# Patient Record
Sex: Female | Born: 1935 | Race: White | Hispanic: No | Smoking: Never smoker
Health system: Southern US, Community
[De-identification: ages and names within clinical notes are randomized; demographics above are authoritative.]

## PROBLEM LIST (undated history)

## (undated) DIAGNOSIS — M069 Rheumatoid arthritis, unspecified: Secondary | ICD-10-CM

## (undated) DIAGNOSIS — N838 Other noninflammatory disorders of ovary, fallopian tube and broad ligament: Secondary | ICD-10-CM

## (undated) DIAGNOSIS — I7090 Unspecified atherosclerosis: Secondary | ICD-10-CM

## (undated) DIAGNOSIS — N189 Chronic kidney disease, unspecified: Secondary | ICD-10-CM

## (undated) DIAGNOSIS — I7300 Raynaud's syndrome without gangrene: Secondary | ICD-10-CM

## (undated) DIAGNOSIS — M359 Systemic involvement of connective tissue, unspecified: Secondary | ICD-10-CM

## (undated) DIAGNOSIS — M810 Age-related osteoporosis without current pathological fracture: Secondary | ICD-10-CM

## (undated) DIAGNOSIS — I700 Atherosclerosis of aorta: Secondary | ICD-10-CM

## (undated) DIAGNOSIS — N182 Chronic kidney disease, stage 2 (mild): Secondary | ICD-10-CM

## (undated) DIAGNOSIS — M48061 Spinal stenosis, lumbar region without neurogenic claudication: Secondary | ICD-10-CM

## (undated) DIAGNOSIS — M4800 Spinal stenosis, site unspecified: Secondary | ICD-10-CM

## (undated) DIAGNOSIS — T7840XA Allergy, unspecified, initial encounter: Secondary | ICD-10-CM

## (undated) DIAGNOSIS — K219 Gastro-esophageal reflux disease without esophagitis: Secondary | ICD-10-CM

## (undated) DIAGNOSIS — M1990 Unspecified osteoarthritis, unspecified site: Secondary | ICD-10-CM

## (undated) DIAGNOSIS — R011 Cardiac murmur, unspecified: Secondary | ICD-10-CM

## (undated) DIAGNOSIS — E785 Hyperlipidemia, unspecified: Secondary | ICD-10-CM

## (undated) DIAGNOSIS — K5790 Diverticulosis of intestine, part unspecified, without perforation or abscess without bleeding: Secondary | ICD-10-CM

## (undated) HISTORY — DX: Spinal stenosis, lumbar region without neurogenic claudication: M48.061

## (undated) HISTORY — DX: Chronic kidney disease, stage 2 (mild): N18.2

## (undated) HISTORY — DX: Allergy, unspecified, initial encounter: T78.40XA

## (undated) HISTORY — DX: Diverticulosis of intestine, part unspecified, without perforation or abscess without bleeding: K57.90

## (undated) HISTORY — DX: Hyperlipidemia, unspecified: E78.5

## (undated) HISTORY — DX: Unspecified osteoarthritis, unspecified site: M19.90

## (undated) HISTORY — DX: Other noninflammatory disorders of ovary, fallopian tube and broad ligament: N83.8

## (undated) HISTORY — DX: Rheumatoid arthritis, unspecified: M06.9

## (undated) HISTORY — DX: Spinal stenosis, site unspecified: M48.00

## (undated) HISTORY — DX: Atherosclerosis of aorta: I70.0

## (undated) HISTORY — DX: Chronic kidney disease, unspecified: N18.9

## (undated) HISTORY — PX: EYE SURGERY: SHX253

## (undated) HISTORY — DX: Age-related osteoporosis without current pathological fracture: M81.0

## (undated) HISTORY — DX: Unspecified atherosclerosis: I70.90

---

## 1963-11-09 HISTORY — PX: BREAST SURGERY: SHX581

## 1963-11-09 HISTORY — PX: EXCISION OF BREAST BIOPSY: SHX5822

## 2003-09-16 ENCOUNTER — Observation Stay (HOSPITAL_COMMUNITY): Admission: RE | Admit: 2003-09-16 | Discharge: 2003-09-17 | Payer: Self-pay | Admitting: Surgery

## 2003-09-16 ENCOUNTER — Encounter (INDEPENDENT_AMBULATORY_CARE_PROVIDER_SITE_OTHER): Payer: Self-pay

## 2003-11-09 HISTORY — PX: CHOLECYSTECTOMY: SHX55

## 2004-08-28 ENCOUNTER — Ambulatory Visit: Payer: Self-pay | Admitting: Unknown Physician Specialty

## 2005-09-03 ENCOUNTER — Ambulatory Visit: Payer: Self-pay | Admitting: Unknown Physician Specialty

## 2005-10-15 ENCOUNTER — Encounter (INDEPENDENT_AMBULATORY_CARE_PROVIDER_SITE_OTHER): Payer: Self-pay | Admitting: Specialist

## 2005-10-15 ENCOUNTER — Ambulatory Visit (HOSPITAL_COMMUNITY): Admission: RE | Admit: 2005-10-15 | Discharge: 2005-10-15 | Payer: Self-pay | Admitting: Gastroenterology

## 2006-07-29 ENCOUNTER — Ambulatory Visit: Payer: Self-pay | Admitting: Unknown Physician Specialty

## 2006-08-10 ENCOUNTER — Ambulatory Visit: Payer: Self-pay | Admitting: Unknown Physician Specialty

## 2006-09-07 ENCOUNTER — Ambulatory Visit: Payer: Self-pay | Admitting: Unknown Physician Specialty

## 2006-11-08 DIAGNOSIS — K5790 Diverticulosis of intestine, part unspecified, without perforation or abscess without bleeding: Secondary | ICD-10-CM

## 2006-11-08 HISTORY — DX: Diverticulosis of intestine, part unspecified, without perforation or abscess without bleeding: K57.90

## 2006-11-08 HISTORY — PX: COLONOSCOPY: SHX174

## 2007-03-14 ENCOUNTER — Emergency Department: Payer: Self-pay | Admitting: Internal Medicine

## 2007-03-14 ENCOUNTER — Other Ambulatory Visit: Payer: Self-pay

## 2007-05-23 ENCOUNTER — Encounter: Admission: RE | Admit: 2007-05-23 | Discharge: 2007-05-23 | Payer: Self-pay | Admitting: Neurology

## 2007-08-21 ENCOUNTER — Ambulatory Visit: Payer: Self-pay | Admitting: Ophthalmology

## 2007-09-04 ENCOUNTER — Ambulatory Visit: Payer: Self-pay | Admitting: Ophthalmology

## 2007-09-13 ENCOUNTER — Ambulatory Visit: Payer: Self-pay | Admitting: Unknown Physician Specialty

## 2007-12-20 ENCOUNTER — Emergency Department: Payer: Self-pay | Admitting: Emergency Medicine

## 2008-01-24 ENCOUNTER — Ambulatory Visit: Payer: Self-pay | Admitting: Unknown Physician Specialty

## 2008-02-02 ENCOUNTER — Ambulatory Visit: Payer: Self-pay | Admitting: Unknown Physician Specialty

## 2008-08-02 ENCOUNTER — Ambulatory Visit: Payer: Self-pay | Admitting: Unknown Physician Specialty

## 2008-09-18 ENCOUNTER — Ambulatory Visit: Payer: Self-pay | Admitting: Unknown Physician Specialty

## 2009-05-08 LAB — HM COLONOSCOPY

## 2009-05-27 ENCOUNTER — Ambulatory Visit: Payer: Self-pay | Admitting: Gastroenterology

## 2009-09-12 ENCOUNTER — Ambulatory Visit: Payer: Self-pay | Admitting: Gastroenterology

## 2009-10-21 ENCOUNTER — Ambulatory Visit: Payer: Self-pay | Admitting: Unknown Physician Specialty

## 2010-01-28 ENCOUNTER — Ambulatory Visit: Payer: Self-pay | Admitting: Internal Medicine

## 2010-07-02 ENCOUNTER — Ambulatory Visit: Payer: Self-pay | Admitting: Orthopedic Surgery

## 2010-11-25 ENCOUNTER — Ambulatory Visit: Payer: Self-pay | Admitting: Unknown Physician Specialty

## 2011-01-01 ENCOUNTER — Ambulatory Visit: Payer: Self-pay | Admitting: Internal Medicine

## 2011-01-06 ENCOUNTER — Ambulatory Visit: Payer: Self-pay | Admitting: Internal Medicine

## 2011-01-25 ENCOUNTER — Ambulatory Visit: Payer: Self-pay | Admitting: Internal Medicine

## 2011-03-26 NOTE — Op Note (Signed)
NAMEJarah, [name]                ACCOUNT NO.:  [id number]   MEDICAL RECORD NO.:  [id number]          PATIENT TYPE:  AMB   LOCATION:  ENDO                         FACILITY:  [facility]   PHYSICIAN:  Anselmo Rod, M.D.  DATE OF BIRTH:  28-May-1927   DATE OF PROCEDURE:  10/15/2005  DATE OF DISCHARGE:                                 OPERATIVE REPORT   PROCEDURE PERFORMED:  Colonoscopy with cold biopsy x1.   ENDOSCOPIST:  Anselmo Rod, M.D.   INSTRUMENT USED:  Olympus video colonoscope.   INDICATIONS FOR PROCEDURE:  A 77 year old white female undergoing a  screening colonoscopy to rule out colonic polyps, masses, etc.   PREPROCEDURE PREPARATION:  Informed consent was procured from the patient.  The patient fasted for eight hours prior to the procedure and prepped with a  bottle of magnesium citrate and a gallon of GoLYTELY the night prior to the  procedure.  Risks and benefits of the procedure including a 10% miss rate of  cancer and polyps was discussed with the patient as well.   PREPROCEDURE PHYSICAL:  VITAL SIGNS:  Stable vital signs.  NECK:  Supple.  CHEST:  Clear to auscultation.  CARDIOVASCULAR:  S1 and S2 regular.  ABDOMEN:  Soft with normal bowel sounds.   DESCRIPTION OF PROCEDURE:  The patient was placed in left lateral decubitus  position, sedated with 60 mcg of Fentanyl and 6 mg of Versed in slow  incremental doses.  Once the patient was adequately sedated and maintained  on low flow oxygen and continuous cardiac monitoring, the Olympus video  colonoscope was advanced from the rectum to the cecum.  The appendiceal  orifice and ileocecal valve were visualized and photographed.  The terminal  ileum appeared healthy and without lesions.  A small sessile polyp was  biopsied from the cecal base.  There was evidence of early sigmoid  diverticula.  Retroflexion revealed no abnormalities.   IMPRESSION:  1.Early sigmoid diverticulosis.  2.Small sessile polyp biopsied from  cecal base.  3.Otherwise unrevealing exam.   RECOMMENDATIONS:  1.Await pathology results.  2.Repeat colonoscopy depending on pathology results.  3.Avoid nonsteroidals including aspirin for the next two weeks.  4.Outpatient as needed arises in the future.      Anselmo Rod, M.D.  Electronically Signed     JNM/MEDQ  D:  10/15/2005  T:  10/15/2005  Job:  [id number]   cc:   Magda Kiel, M.D.

## 2011-03-26 NOTE — Op Note (Signed)
[name], [name]                            ACCOUNT NO.:  [id number]   MEDICAL RECORD NO.:  [id number]                   PATIENT TYPE:  AMB   LOCATION:  DAY                                  [facility]ACILITY[facility]  [facility]   PHYSICIAN:  Abigail Miyamoto, M.D.              DATE OF BIRTH:  03-25-1927   DATE OF PROCEDURE:  09/16/2003  DATE OF DISCHARGE:                                 OPERATIVE REPORT   PREOPERATIVE DIAGNOSIS:  Biliary dyskinesia   POSTOPERATIVE DIAGNOSIS:  Biliary dyskinesia.   PROCEDURE:  Laparoscopic cholecystectomy with intraoperative cholangiogram.   SURGEON:  Abigail Miyamoto, M.D.   ASSISTANT:  Adolph Pollack, M.D.   ANESTHESIA:  General endotracheal anesthesia.   ESTIMATED BLOOD LOSS:  Minimal.   FINDINGS:  The patient was found to have a normal cholangiogram.   DESCRIPTION OF PROCEDURE:  The patient was brought to the operating room and  identified as [name]. She was placed in the supine position on the  operating table and general anesthesia was induced. Her  abdomen  was then  prepped and draped in the usual sterile fashion.   Using the #15 blade a small  transverse incision was made below the  umbilicus. This incision was carried down to the fascia which was opened  with the scalpel. A hemostat was used to pass through the peritoneal cavity.   Next a 0 Vicryl pursestring suture was placed around the fascial opening.  The Hasson port was placed through the opening and  insufflation of the  abdomen was begun. A  12-mm port was then placed in the patient's epigastrium and two 5-mm ports  were placed in the patient's right flank under direct vision. The  gallbladder was then grasped and retracted above the liver bed.   Dissection was then carried out at the base of the gallbladder. The cystic  artery was found to be anterior. It was clipped proximally  and once  distally and transected with scissors. The cystic duct was then identified  and clipped  once distally. An angio catheter was then inserted in the right  upper quadrant under direct vision. The cholangiocath was passed through  this.   A small  opening was then created in the cystic duct with the laparoscopic  scissors. The cholangiocatheter was then passed into the cystic duct. A  cholangiogram  was then performed under direct fluoroscopy with contrast.  Contrast was seen to flow into the entire biliary system and duodenum  without evidence of abnormality. The cholangiocatheter was then completely  removed.   The cystic duct was then clipped twice proximally  and transected with  scissors. The  gallbladder  was then dissected free from the liver bed with  the electrocautery. Hemostasis was achieved in the liver bed with  electrocautery. Once the gallbladder was free from the liver bed, it was  pulled out through the incision at the umbilicus.  The 0 Vicryl the umbilicus was tied in place, closing the fascial defect.  The abdomen was then irrigated with normal saline. Again hemostasis appeared  to be achieved. All ports were removed under direct vision and the abdomen  was deflated. All incisions were anesthetized with 0.25% Marcaine and closed  with 4-0 Monocryl subcuticular sutures. Steri-Strips, gauze and tape were  applied.   The patient tolerated the procedure well. All sponge, instrument and needle  counts were correct at the end of the procedure. The patient was then  extubated in the operating room and taken to the recovery room in stable  condition.                                               Abigail Miyamoto, M.D.    DB/MEDQ  D:  09/16/2003  T:  09/16/2003  Job:  [id number]   cc:   Anselmo Rod, M.D.  [address]  [address]  [place]  [place] [address]  Fax: [phone]

## 2011-04-08 ENCOUNTER — Ambulatory Visit: Payer: Self-pay | Admitting: Rheumatology

## 2011-05-24 ENCOUNTER — Emergency Department: Payer: Self-pay | Admitting: Emergency Medicine

## 2011-12-08 ENCOUNTER — Ambulatory Visit: Payer: Self-pay | Admitting: Internal Medicine

## 2011-12-08 DIAGNOSIS — Z1231 Encounter for screening mammogram for malignant neoplasm of breast: Secondary | ICD-10-CM | POA: Diagnosis not present

## 2011-12-31 DIAGNOSIS — H2510 Age-related nuclear cataract, unspecified eye: Secondary | ICD-10-CM | POA: Diagnosis not present

## 2011-12-31 DIAGNOSIS — Z961 Presence of intraocular lens: Secondary | ICD-10-CM | POA: Diagnosis not present

## 2012-01-19 DIAGNOSIS — R109 Unspecified abdominal pain: Secondary | ICD-10-CM | POA: Diagnosis not present

## 2012-01-21 DIAGNOSIS — R109 Unspecified abdominal pain: Secondary | ICD-10-CM | POA: Diagnosis not present

## 2012-02-16 DIAGNOSIS — K589 Irritable bowel syndrome without diarrhea: Secondary | ICD-10-CM | POA: Insufficient documentation

## 2012-02-16 DIAGNOSIS — R109 Unspecified abdominal pain: Secondary | ICD-10-CM | POA: Diagnosis not present

## 2012-04-05 DIAGNOSIS — R079 Chest pain, unspecified: Secondary | ICD-10-CM | POA: Diagnosis not present

## 2012-04-05 DIAGNOSIS — M810 Age-related osteoporosis without current pathological fracture: Secondary | ICD-10-CM | POA: Diagnosis not present

## 2012-04-05 DIAGNOSIS — IMO0002 Reserved for concepts with insufficient information to code with codable children: Secondary | ICD-10-CM | POA: Diagnosis not present

## 2012-04-18 DIAGNOSIS — S79912A Unspecified injury of left hip, initial encounter: Secondary | ICD-10-CM | POA: Diagnosis not present

## 2012-04-18 DIAGNOSIS — L089 Local infection of the skin and subcutaneous tissue, unspecified: Secondary | ICD-10-CM | POA: Diagnosis not present

## 2012-04-18 DIAGNOSIS — R234 Changes in skin texture: Secondary | ICD-10-CM | POA: Diagnosis not present

## 2012-04-18 DIAGNOSIS — W64XXXA Exposure to other animate mechanical forces, initial encounter: Secondary | ICD-10-CM | POA: Diagnosis not present

## 2012-06-05 DIAGNOSIS — L981 Factitial dermatitis: Secondary | ICD-10-CM | POA: Diagnosis not present

## 2012-06-05 DIAGNOSIS — L739 Follicular disorder, unspecified: Secondary | ICD-10-CM | POA: Diagnosis not present

## 2012-06-05 DIAGNOSIS — L570 Actinic keratosis: Secondary | ICD-10-CM | POA: Diagnosis not present

## 2012-06-30 DIAGNOSIS — R109 Unspecified abdominal pain: Secondary | ICD-10-CM | POA: Diagnosis not present

## 2012-06-30 DIAGNOSIS — E039 Hypothyroidism, unspecified: Secondary | ICD-10-CM | POA: Diagnosis not present

## 2012-06-30 DIAGNOSIS — Z87828 Personal history of other (healed) physical injury and trauma: Secondary | ICD-10-CM | POA: Diagnosis not present

## 2012-07-05 ENCOUNTER — Ambulatory Visit: Payer: Self-pay | Admitting: Internal Medicine

## 2012-07-05 DIAGNOSIS — R911 Solitary pulmonary nodule: Secondary | ICD-10-CM | POA: Diagnosis not present

## 2012-07-05 DIAGNOSIS — N83209 Unspecified ovarian cyst, unspecified side: Secondary | ICD-10-CM | POA: Diagnosis not present

## 2012-07-05 DIAGNOSIS — R109 Unspecified abdominal pain: Secondary | ICD-10-CM | POA: Diagnosis not present

## 2012-07-13 DIAGNOSIS — R918 Other nonspecific abnormal finding of lung field: Secondary | ICD-10-CM | POA: Insufficient documentation

## 2012-07-13 DIAGNOSIS — IMO0002 Reserved for concepts with insufficient information to code with codable children: Secondary | ICD-10-CM | POA: Diagnosis not present

## 2012-07-13 DIAGNOSIS — R109 Unspecified abdominal pain: Secondary | ICD-10-CM | POA: Diagnosis not present

## 2012-07-13 DIAGNOSIS — R911 Solitary pulmonary nodule: Secondary | ICD-10-CM | POA: Insufficient documentation

## 2012-08-11 DIAGNOSIS — Z23 Encounter for immunization: Secondary | ICD-10-CM | POA: Diagnosis not present

## 2012-08-11 DIAGNOSIS — IMO0002 Reserved for concepts with insufficient information to code with codable children: Secondary | ICD-10-CM | POA: Diagnosis not present

## 2012-08-11 DIAGNOSIS — R030 Elevated blood-pressure reading, without diagnosis of hypertension: Secondary | ICD-10-CM | POA: Diagnosis not present

## 2012-08-11 DIAGNOSIS — Z1239 Encounter for other screening for malignant neoplasm of breast: Secondary | ICD-10-CM | POA: Diagnosis not present

## 2012-09-07 DIAGNOSIS — IMO0002 Reserved for concepts with insufficient information to code with codable children: Secondary | ICD-10-CM | POA: Diagnosis not present

## 2012-09-07 DIAGNOSIS — R030 Elevated blood-pressure reading, without diagnosis of hypertension: Secondary | ICD-10-CM | POA: Diagnosis not present

## 2012-09-13 DIAGNOSIS — I7300 Raynaud's syndrome without gangrene: Secondary | ICD-10-CM | POA: Diagnosis not present

## 2012-09-13 DIAGNOSIS — M810 Age-related osteoporosis without current pathological fracture: Secondary | ICD-10-CM | POA: Diagnosis not present

## 2012-09-24 DIAGNOSIS — H6690 Otitis media, unspecified, unspecified ear: Secondary | ICD-10-CM | POA: Diagnosis not present

## 2012-09-24 DIAGNOSIS — J019 Acute sinusitis, unspecified: Secondary | ICD-10-CM | POA: Diagnosis not present

## 2012-10-11 DIAGNOSIS — R05 Cough: Secondary | ICD-10-CM | POA: Diagnosis not present

## 2012-10-11 DIAGNOSIS — R109 Unspecified abdominal pain: Secondary | ICD-10-CM | POA: Diagnosis not present

## 2012-10-11 DIAGNOSIS — R059 Cough, unspecified: Secondary | ICD-10-CM | POA: Diagnosis not present

## 2012-10-25 DIAGNOSIS — M810 Age-related osteoporosis without current pathological fracture: Secondary | ICD-10-CM | POA: Diagnosis not present

## 2012-11-13 DIAGNOSIS — R030 Elevated blood-pressure reading, without diagnosis of hypertension: Secondary | ICD-10-CM | POA: Diagnosis not present

## 2012-11-13 DIAGNOSIS — M810 Age-related osteoporosis without current pathological fracture: Secondary | ICD-10-CM | POA: Diagnosis not present

## 2012-12-08 ENCOUNTER — Ambulatory Visit: Payer: Self-pay | Admitting: Internal Medicine

## 2012-12-08 DIAGNOSIS — Z1231 Encounter for screening mammogram for malignant neoplasm of breast: Secondary | ICD-10-CM | POA: Diagnosis not present

## 2012-12-28 DIAGNOSIS — D210 Benign neoplasm of connective and other soft tissue of head, face and neck: Secondary | ICD-10-CM | POA: Diagnosis not present

## 2012-12-28 DIAGNOSIS — D485 Neoplasm of uncertain behavior of skin: Secondary | ICD-10-CM | POA: Diagnosis not present

## 2012-12-28 DIAGNOSIS — L03019 Cellulitis of unspecified finger: Secondary | ICD-10-CM | POA: Diagnosis not present

## 2013-01-10 DIAGNOSIS — H2510 Age-related nuclear cataract, unspecified eye: Secondary | ICD-10-CM | POA: Diagnosis not present

## 2013-01-10 DIAGNOSIS — Z961 Presence of intraocular lens: Secondary | ICD-10-CM | POA: Diagnosis not present

## 2013-01-19 DIAGNOSIS — D3130 Benign neoplasm of unspecified choroid: Secondary | ICD-10-CM | POA: Diagnosis not present

## 2013-01-25 ENCOUNTER — Ambulatory Visit: Payer: Self-pay | Admitting: Ophthalmology

## 2013-01-25 DIAGNOSIS — I658 Occlusion and stenosis of other precerebral arteries: Secondary | ICD-10-CM | POA: Diagnosis not present

## 2013-01-25 DIAGNOSIS — I6529 Occlusion and stenosis of unspecified carotid artery: Secondary | ICD-10-CM | POA: Diagnosis not present

## 2013-01-29 DIAGNOSIS — H3400 Transient retinal artery occlusion, unspecified eye: Secondary | ICD-10-CM | POA: Diagnosis not present

## 2013-01-29 DIAGNOSIS — R109 Unspecified abdominal pain: Secondary | ICD-10-CM | POA: Diagnosis not present

## 2013-01-29 DIAGNOSIS — R002 Palpitations: Secondary | ICD-10-CM | POA: Diagnosis not present

## 2013-01-29 DIAGNOSIS — G453 Amaurosis fugax: Secondary | ICD-10-CM | POA: Insufficient documentation

## 2013-01-31 ENCOUNTER — Ambulatory Visit: Payer: Self-pay | Admitting: Internal Medicine

## 2013-01-31 DIAGNOSIS — R109 Unspecified abdominal pain: Secondary | ICD-10-CM | POA: Diagnosis not present

## 2013-01-31 DIAGNOSIS — R002 Palpitations: Secondary | ICD-10-CM | POA: Diagnosis not present

## 2013-01-31 DIAGNOSIS — H3400 Transient retinal artery occlusion, unspecified eye: Secondary | ICD-10-CM | POA: Diagnosis not present

## 2013-02-14 DIAGNOSIS — D3130 Benign neoplasm of unspecified choroid: Secondary | ICD-10-CM | POA: Diagnosis not present

## 2013-03-15 DIAGNOSIS — R1011 Right upper quadrant pain: Secondary | ICD-10-CM | POA: Diagnosis not present

## 2013-03-16 DIAGNOSIS — D3130 Benign neoplasm of unspecified choroid: Secondary | ICD-10-CM | POA: Diagnosis not present

## 2013-03-21 DIAGNOSIS — E785 Hyperlipidemia, unspecified: Secondary | ICD-10-CM | POA: Diagnosis not present

## 2013-03-21 DIAGNOSIS — M62838 Other muscle spasm: Secondary | ICD-10-CM | POA: Diagnosis not present

## 2013-03-21 DIAGNOSIS — R002 Palpitations: Secondary | ICD-10-CM | POA: Diagnosis not present

## 2013-03-21 DIAGNOSIS — H3400 Transient retinal artery occlusion, unspecified eye: Secondary | ICD-10-CM | POA: Diagnosis not present

## 2013-03-23 DIAGNOSIS — R109 Unspecified abdominal pain: Secondary | ICD-10-CM | POA: Diagnosis not present

## 2013-03-26 DIAGNOSIS — Z1211 Encounter for screening for malignant neoplasm of colon: Secondary | ICD-10-CM | POA: Diagnosis not present

## 2013-03-28 ENCOUNTER — Ambulatory Visit: Payer: Self-pay | Admitting: Internal Medicine

## 2013-03-28 DIAGNOSIS — I079 Rheumatic tricuspid valve disease, unspecified: Secondary | ICD-10-CM | POA: Diagnosis not present

## 2013-03-28 DIAGNOSIS — R002 Palpitations: Secondary | ICD-10-CM | POA: Diagnosis not present

## 2013-03-28 DIAGNOSIS — I059 Rheumatic mitral valve disease, unspecified: Secondary | ICD-10-CM | POA: Diagnosis not present

## 2013-04-11 DIAGNOSIS — Z86010 Personal history of colonic polyps: Secondary | ICD-10-CM | POA: Diagnosis not present

## 2013-04-26 DIAGNOSIS — E785 Hyperlipidemia, unspecified: Secondary | ICD-10-CM | POA: Diagnosis not present

## 2013-05-02 DIAGNOSIS — R002 Palpitations: Secondary | ICD-10-CM | POA: Diagnosis not present

## 2013-05-02 DIAGNOSIS — H3400 Transient retinal artery occlusion, unspecified eye: Secondary | ICD-10-CM | POA: Diagnosis not present

## 2013-05-02 DIAGNOSIS — E785 Hyperlipidemia, unspecified: Secondary | ICD-10-CM | POA: Diagnosis not present

## 2013-05-02 DIAGNOSIS — K120 Recurrent oral aphthae: Secondary | ICD-10-CM | POA: Diagnosis not present

## 2013-05-17 DIAGNOSIS — L719 Rosacea, unspecified: Secondary | ICD-10-CM | POA: Diagnosis not present

## 2013-05-17 DIAGNOSIS — L905 Scar conditions and fibrosis of skin: Secondary | ICD-10-CM | POA: Diagnosis not present

## 2013-06-06 DIAGNOSIS — E7800 Pure hypercholesterolemia, unspecified: Secondary | ICD-10-CM | POA: Diagnosis not present

## 2013-06-14 ENCOUNTER — Ambulatory Visit: Payer: Self-pay | Admitting: Family Medicine

## 2013-06-14 DIAGNOSIS — R911 Solitary pulmonary nodule: Secondary | ICD-10-CM | POA: Diagnosis not present

## 2013-06-14 DIAGNOSIS — R918 Other nonspecific abnormal finding of lung field: Secondary | ICD-10-CM | POA: Diagnosis not present

## 2013-06-15 DIAGNOSIS — D3130 Benign neoplasm of unspecified choroid: Secondary | ICD-10-CM | POA: Diagnosis not present

## 2013-07-19 DIAGNOSIS — G43109 Migraine with aura, not intractable, without status migrainosus: Secondary | ICD-10-CM | POA: Diagnosis not present

## 2013-07-24 DIAGNOSIS — H6120 Impacted cerumen, unspecified ear: Secondary | ICD-10-CM | POA: Diagnosis not present

## 2013-07-24 DIAGNOSIS — J301 Allergic rhinitis due to pollen: Secondary | ICD-10-CM | POA: Diagnosis not present

## 2013-07-24 DIAGNOSIS — J0100 Acute maxillary sinusitis, unspecified: Secondary | ICD-10-CM | POA: Diagnosis not present

## 2013-09-08 DIAGNOSIS — Z23 Encounter for immunization: Secondary | ICD-10-CM | POA: Diagnosis not present

## 2013-11-22 DIAGNOSIS — M19049 Primary osteoarthritis, unspecified hand: Secondary | ICD-10-CM | POA: Diagnosis not present

## 2013-11-22 DIAGNOSIS — M810 Age-related osteoporosis without current pathological fracture: Secondary | ICD-10-CM | POA: Diagnosis not present

## 2013-11-22 DIAGNOSIS — I7300 Raynaud's syndrome without gangrene: Secondary | ICD-10-CM | POA: Diagnosis not present

## 2013-11-25 DIAGNOSIS — J00 Acute nasopharyngitis [common cold]: Secondary | ICD-10-CM | POA: Diagnosis not present

## 2013-11-29 DIAGNOSIS — L738 Other specified follicular disorders: Secondary | ICD-10-CM | POA: Diagnosis not present

## 2013-11-29 DIAGNOSIS — L678 Other hair color and hair shaft abnormalities: Secondary | ICD-10-CM | POA: Diagnosis not present

## 2013-11-29 DIAGNOSIS — B351 Tinea unguium: Secondary | ICD-10-CM | POA: Diagnosis not present

## 2013-12-06 DIAGNOSIS — N6489 Other specified disorders of breast: Secondary | ICD-10-CM | POA: Diagnosis not present

## 2013-12-06 DIAGNOSIS — M810 Age-related osteoporosis without current pathological fracture: Secondary | ICD-10-CM | POA: Diagnosis not present

## 2013-12-06 DIAGNOSIS — R1903 Right lower quadrant abdominal swelling, mass and lump: Secondary | ICD-10-CM | POA: Diagnosis not present

## 2013-12-06 DIAGNOSIS — E7800 Pure hypercholesterolemia, unspecified: Secondary | ICD-10-CM | POA: Diagnosis not present

## 2013-12-13 ENCOUNTER — Ambulatory Visit: Payer: Self-pay | Admitting: Family Medicine

## 2013-12-13 DIAGNOSIS — N6459 Other signs and symptoms in breast: Secondary | ICD-10-CM | POA: Diagnosis not present

## 2013-12-13 DIAGNOSIS — N281 Cyst of kidney, acquired: Secondary | ICD-10-CM | POA: Diagnosis not present

## 2013-12-13 DIAGNOSIS — R109 Unspecified abdominal pain: Secondary | ICD-10-CM | POA: Diagnosis not present

## 2013-12-14 DIAGNOSIS — D3130 Benign neoplasm of unspecified choroid: Secondary | ICD-10-CM | POA: Diagnosis not present

## 2013-12-21 DIAGNOSIS — E875 Hyperkalemia: Secondary | ICD-10-CM | POA: Diagnosis not present

## 2013-12-24 ENCOUNTER — Ambulatory Visit: Payer: Self-pay | Admitting: Family Medicine

## 2013-12-24 DIAGNOSIS — R928 Other abnormal and inconclusive findings on diagnostic imaging of breast: Secondary | ICD-10-CM | POA: Diagnosis not present

## 2013-12-24 DIAGNOSIS — R922 Inconclusive mammogram: Secondary | ICD-10-CM | POA: Diagnosis not present

## 2013-12-27 ENCOUNTER — Ambulatory Visit: Payer: Self-pay | Admitting: Family Medicine

## 2013-12-27 DIAGNOSIS — R1903 Right lower quadrant abdominal swelling, mass and lump: Secondary | ICD-10-CM | POA: Diagnosis not present

## 2013-12-27 DIAGNOSIS — D259 Leiomyoma of uterus, unspecified: Secondary | ICD-10-CM | POA: Diagnosis not present

## 2014-01-09 DIAGNOSIS — J0100 Acute maxillary sinusitis, unspecified: Secondary | ICD-10-CM | POA: Diagnosis not present

## 2014-01-09 DIAGNOSIS — J040 Acute laryngitis: Secondary | ICD-10-CM | POA: Diagnosis not present

## 2014-01-10 DIAGNOSIS — N951 Menopausal and female climacteric states: Secondary | ICD-10-CM | POA: Diagnosis not present

## 2014-01-10 DIAGNOSIS — N859 Noninflammatory disorder of uterus, unspecified: Secondary | ICD-10-CM | POA: Diagnosis not present

## 2014-01-17 DIAGNOSIS — H43819 Vitreous degeneration, unspecified eye: Secondary | ICD-10-CM | POA: Diagnosis not present

## 2014-01-30 DIAGNOSIS — L02219 Cutaneous abscess of trunk, unspecified: Secondary | ICD-10-CM | POA: Diagnosis not present

## 2014-01-30 DIAGNOSIS — T148 Other injury of unspecified body region: Secondary | ICD-10-CM | POA: Diagnosis not present

## 2014-01-30 DIAGNOSIS — N9489 Other specified conditions associated with female genital organs and menstrual cycle: Secondary | ICD-10-CM | POA: Diagnosis not present

## 2014-01-30 DIAGNOSIS — R9389 Abnormal findings on diagnostic imaging of other specified body structures: Secondary | ICD-10-CM | POA: Diagnosis not present

## 2014-01-30 DIAGNOSIS — L03319 Cellulitis of trunk, unspecified: Secondary | ICD-10-CM | POA: Diagnosis not present

## 2014-01-30 DIAGNOSIS — R1013 Epigastric pain: Secondary | ICD-10-CM | POA: Diagnosis not present

## 2014-01-30 DIAGNOSIS — R1909 Other intra-abdominal and pelvic swelling, mass and lump: Secondary | ICD-10-CM | POA: Diagnosis not present

## 2014-02-04 ENCOUNTER — Ambulatory Visit: Payer: Self-pay | Admitting: Family Medicine

## 2014-02-04 DIAGNOSIS — R109 Unspecified abdominal pain: Secondary | ICD-10-CM | POA: Diagnosis not present

## 2014-02-04 DIAGNOSIS — R1903 Right lower quadrant abdominal swelling, mass and lump: Secondary | ICD-10-CM | POA: Diagnosis not present

## 2014-02-04 DIAGNOSIS — D219 Benign neoplasm of connective and other soft tissue, unspecified: Secondary | ICD-10-CM | POA: Diagnosis not present

## 2014-02-04 DIAGNOSIS — K5730 Diverticulosis of large intestine without perforation or abscess without bleeding: Secondary | ICD-10-CM | POA: Diagnosis not present

## 2014-02-04 DIAGNOSIS — S32009A Unspecified fracture of unspecified lumbar vertebra, initial encounter for closed fracture: Secondary | ICD-10-CM | POA: Diagnosis not present

## 2014-02-04 DIAGNOSIS — N83209 Unspecified ovarian cyst, unspecified side: Secondary | ICD-10-CM | POA: Diagnosis not present

## 2014-02-04 DIAGNOSIS — D259 Leiomyoma of uterus, unspecified: Secondary | ICD-10-CM | POA: Diagnosis not present

## 2014-05-13 DIAGNOSIS — R109 Unspecified abdominal pain: Secondary | ICD-10-CM | POA: Diagnosis not present

## 2014-06-05 DIAGNOSIS — E7800 Pure hypercholesterolemia, unspecified: Secondary | ICD-10-CM | POA: Diagnosis not present

## 2014-06-05 DIAGNOSIS — B999 Unspecified infectious disease: Secondary | ICD-10-CM | POA: Diagnosis not present

## 2014-06-05 DIAGNOSIS — M069 Rheumatoid arthritis, unspecified: Secondary | ICD-10-CM | POA: Diagnosis not present

## 2014-06-05 DIAGNOSIS — R1903 Right lower quadrant abdominal swelling, mass and lump: Secondary | ICD-10-CM | POA: Diagnosis not present

## 2014-06-13 ENCOUNTER — Ambulatory Visit: Payer: Self-pay | Admitting: Family Medicine

## 2014-06-13 DIAGNOSIS — R109 Unspecified abdominal pain: Secondary | ICD-10-CM | POA: Diagnosis not present

## 2014-06-13 DIAGNOSIS — I2510 Atherosclerotic heart disease of native coronary artery without angina pectoris: Secondary | ICD-10-CM | POA: Diagnosis not present

## 2014-06-13 DIAGNOSIS — R911 Solitary pulmonary nodule: Secondary | ICD-10-CM | POA: Diagnosis not present

## 2014-06-13 DIAGNOSIS — N949 Unspecified condition associated with female genital organs and menstrual cycle: Secondary | ICD-10-CM | POA: Diagnosis not present

## 2014-06-13 DIAGNOSIS — J479 Bronchiectasis, uncomplicated: Secondary | ICD-10-CM | POA: Diagnosis not present

## 2014-06-14 DIAGNOSIS — Z86010 Personal history of colonic polyps: Secondary | ICD-10-CM | POA: Diagnosis not present

## 2014-06-14 DIAGNOSIS — R1011 Right upper quadrant pain: Secondary | ICD-10-CM | POA: Diagnosis not present

## 2014-06-14 DIAGNOSIS — R1310 Dysphagia, unspecified: Secondary | ICD-10-CM | POA: Diagnosis not present

## 2014-06-18 DIAGNOSIS — Z86010 Personal history of colonic polyps: Secondary | ICD-10-CM | POA: Diagnosis not present

## 2014-06-19 ENCOUNTER — Ambulatory Visit: Payer: Self-pay | Admitting: Family Medicine

## 2014-06-19 DIAGNOSIS — D259 Leiomyoma of uterus, unspecified: Secondary | ICD-10-CM | POA: Diagnosis not present

## 2014-06-19 DIAGNOSIS — N949 Unspecified condition associated with female genital organs and menstrual cycle: Secondary | ICD-10-CM | POA: Diagnosis not present

## 2014-06-19 DIAGNOSIS — K5730 Diverticulosis of large intestine without perforation or abscess without bleeding: Secondary | ICD-10-CM | POA: Diagnosis not present

## 2014-06-19 DIAGNOSIS — D1803 Hemangioma of intra-abdominal structures: Secondary | ICD-10-CM | POA: Diagnosis not present

## 2014-06-19 DIAGNOSIS — R109 Unspecified abdominal pain: Secondary | ICD-10-CM | POA: Diagnosis not present

## 2014-06-24 DIAGNOSIS — I2510 Atherosclerotic heart disease of native coronary artery without angina pectoris: Secondary | ICD-10-CM | POA: Insufficient documentation

## 2014-06-24 DIAGNOSIS — I10 Essential (primary) hypertension: Secondary | ICD-10-CM | POA: Diagnosis not present

## 2014-06-24 DIAGNOSIS — E785 Hyperlipidemia, unspecified: Secondary | ICD-10-CM | POA: Diagnosis not present

## 2014-06-24 DIAGNOSIS — R0602 Shortness of breath: Secondary | ICD-10-CM | POA: Diagnosis not present

## 2014-06-27 ENCOUNTER — Ambulatory Visit: Payer: Self-pay | Admitting: Gastroenterology

## 2014-06-27 DIAGNOSIS — R1310 Dysphagia, unspecified: Secondary | ICD-10-CM | POA: Diagnosis not present

## 2014-06-27 DIAGNOSIS — K228 Other specified diseases of esophagus: Secondary | ICD-10-CM | POA: Diagnosis not present

## 2014-06-27 DIAGNOSIS — K2289 Other specified disease of esophagus: Secondary | ICD-10-CM | POA: Diagnosis not present

## 2014-06-27 DIAGNOSIS — K449 Diaphragmatic hernia without obstruction or gangrene: Secondary | ICD-10-CM | POA: Diagnosis not present

## 2014-07-04 ENCOUNTER — Ambulatory Visit: Payer: Self-pay | Admitting: Family Medicine

## 2014-07-04 DIAGNOSIS — IMO0002 Reserved for concepts with insufficient information to code with codable children: Secondary | ICD-10-CM | POA: Diagnosis not present

## 2014-07-04 DIAGNOSIS — I10 Essential (primary) hypertension: Secondary | ICD-10-CM | POA: Diagnosis not present

## 2014-07-04 DIAGNOSIS — E785 Hyperlipidemia, unspecified: Secondary | ICD-10-CM | POA: Diagnosis not present

## 2014-07-04 DIAGNOSIS — M48061 Spinal stenosis, lumbar region without neurogenic claudication: Secondary | ICD-10-CM | POA: Diagnosis not present

## 2014-07-04 DIAGNOSIS — R0602 Shortness of breath: Secondary | ICD-10-CM | POA: Diagnosis not present

## 2014-07-04 DIAGNOSIS — R002 Palpitations: Secondary | ICD-10-CM | POA: Diagnosis not present

## 2014-07-04 DIAGNOSIS — M5124 Other intervertebral disc displacement, thoracic region: Secondary | ICD-10-CM | POA: Diagnosis not present

## 2014-07-04 DIAGNOSIS — M47814 Spondylosis without myelopathy or radiculopathy, thoracic region: Secondary | ICD-10-CM | POA: Diagnosis not present

## 2014-07-04 DIAGNOSIS — M545 Low back pain, unspecified: Secondary | ICD-10-CM | POA: Diagnosis not present

## 2014-07-18 DIAGNOSIS — K5730 Diverticulosis of large intestine without perforation or abscess without bleeding: Secondary | ICD-10-CM | POA: Diagnosis not present

## 2014-07-18 DIAGNOSIS — M810 Age-related osteoporosis without current pathological fracture: Secondary | ICD-10-CM | POA: Diagnosis not present

## 2014-07-18 DIAGNOSIS — M48061 Spinal stenosis, lumbar region without neurogenic claudication: Secondary | ICD-10-CM | POA: Diagnosis not present

## 2014-07-19 DIAGNOSIS — R5381 Other malaise: Secondary | ICD-10-CM | POA: Diagnosis not present

## 2014-07-19 DIAGNOSIS — B999 Unspecified infectious disease: Secondary | ICD-10-CM | POA: Diagnosis not present

## 2014-07-19 DIAGNOSIS — R5383 Other fatigue: Secondary | ICD-10-CM | POA: Diagnosis not present

## 2014-08-01 DIAGNOSIS — R030 Elevated blood-pressure reading, without diagnosis of hypertension: Secondary | ICD-10-CM | POA: Diagnosis not present

## 2014-08-01 DIAGNOSIS — M48062 Spinal stenosis, lumbar region with neurogenic claudication: Secondary | ICD-10-CM | POA: Diagnosis not present

## 2014-08-08 DIAGNOSIS — R911 Solitary pulmonary nodule: Secondary | ICD-10-CM | POA: Diagnosis not present

## 2014-08-08 DIAGNOSIS — J479 Bronchiectasis, uncomplicated: Secondary | ICD-10-CM | POA: Diagnosis not present

## 2014-08-08 DIAGNOSIS — R0602 Shortness of breath: Secondary | ICD-10-CM | POA: Diagnosis not present

## 2014-08-22 DIAGNOSIS — M4806 Spinal stenosis, lumbar region: Secondary | ICD-10-CM | POA: Diagnosis not present

## 2014-09-05 DIAGNOSIS — Z23 Encounter for immunization: Secondary | ICD-10-CM | POA: Diagnosis not present

## 2014-09-20 DIAGNOSIS — J40 Bronchitis, not specified as acute or chronic: Secondary | ICD-10-CM | POA: Diagnosis not present

## 2014-09-20 DIAGNOSIS — J479 Bronchiectasis, uncomplicated: Secondary | ICD-10-CM | POA: Diagnosis not present

## 2014-09-20 DIAGNOSIS — J0100 Acute maxillary sinusitis, unspecified: Secondary | ICD-10-CM | POA: Diagnosis not present

## 2014-12-12 DIAGNOSIS — L718 Other rosacea: Secondary | ICD-10-CM | POA: Diagnosis not present

## 2014-12-12 DIAGNOSIS — L821 Other seborrheic keratosis: Secondary | ICD-10-CM | POA: Diagnosis not present

## 2014-12-13 DIAGNOSIS — Z23 Encounter for immunization: Secondary | ICD-10-CM | POA: Diagnosis not present

## 2014-12-13 DIAGNOSIS — Z0000 Encounter for general adult medical examination without abnormal findings: Secondary | ICD-10-CM | POA: Diagnosis not present

## 2014-12-13 DIAGNOSIS — Z01419 Encounter for gynecological examination (general) (routine) without abnormal findings: Secondary | ICD-10-CM | POA: Diagnosis not present

## 2014-12-19 ENCOUNTER — Encounter: Payer: Self-pay | Admitting: *Deleted

## 2014-12-24 ENCOUNTER — Ambulatory Visit: Payer: Self-pay | Admitting: General Surgery

## 2014-12-26 ENCOUNTER — Ambulatory Visit (INDEPENDENT_AMBULATORY_CARE_PROVIDER_SITE_OTHER): Payer: Medicare Other | Admitting: General Surgery

## 2014-12-26 ENCOUNTER — Ambulatory Visit: Payer: Self-pay | Admitting: Family Medicine

## 2014-12-26 ENCOUNTER — Encounter: Payer: Self-pay | Admitting: General Surgery

## 2014-12-26 VITALS — BP 108/72 | HR 72 | Resp 12 | Ht 61.0 in | Wt 104.0 lb

## 2014-12-26 DIAGNOSIS — M810 Age-related osteoporosis without current pathological fracture: Secondary | ICD-10-CM | POA: Diagnosis not present

## 2014-12-26 DIAGNOSIS — M8588 Other specified disorders of bone density and structure, other site: Secondary | ICD-10-CM | POA: Diagnosis not present

## 2014-12-26 DIAGNOSIS — R1031 Right lower quadrant pain: Secondary | ICD-10-CM

## 2014-12-26 DIAGNOSIS — Z1231 Encounter for screening mammogram for malignant neoplasm of breast: Secondary | ICD-10-CM | POA: Diagnosis not present

## 2014-12-26 LAB — HM MAMMOGRAPHY

## 2014-12-26 LAB — HM DEXA SCAN

## 2014-12-26 NOTE — Patient Instructions (Addendum)
Discussed options of diagnostic laparotomy with risk and benefits. She will decide and get back with [place] regarding her decisions.

## 2014-12-26 NOTE — Progress Notes (Signed)
Patient ID: Bedelia Pong, female   DOB: 03-Feb-1927, 80 y.o.   MRN: [id number]  Chief Complaint  Patient presents with  . Abdominal Pain    HPI Johnnetta Holstine is a 81 y.o. female.  Here today for evaluation of right lower quadrant pain since July 2015. The pain is worse at night. She can go all day with no pain then she will have some spasms in the evenings and then pain at night.  Described more as "presssure". There is a knot right lower abdomen.that has been there for about 2 years. Stool guaiac negative. She had   MRI done last year on 06-19-14. She had a barium swallow ordered by Dr. Donnella Sham on 06-27-14 was normal.   HPI  Past Medical History  Diagnosis Date  . Hyperlipidemia   . Osteoporosis   . Arthritis   . RA (rheumatoid arthritis)   . Chronic kidney disease     stage 2  . Spinal stenosis   . Diverticulosis 2008  . Atherosclerosis     Past Surgical History  Procedure Laterality Date  . Cholecystectomy  2005    [place]  . Excision of breast biopsy Left 1965    benign  . Colonoscopy  2008    Dr. Donnella Sham    Family History  Problem Relation Age of Onset  . Cancer Brother     lung  . Stroke Brother   . Heart disease Brother     Social History History  Substance Use Topics  . Smoking status: Never Smoker   . Smokeless tobacco: Never Used  . Alcohol Use: No    Allergies  Allergen Reactions  . Doxycycline Rash  . Prednisone Rash    Current Outpatient Prescriptions  Medication Sig Dispense Refill  . aspirin 81 MG tablet Take 81 mg by mouth daily.    . Calcium Carbonate-Vitamin D (CALCIUM + D PO) Take by mouth.    . Cholecalciferol (VITAMIN D3) 3000 UNITS TABS Take by mouth.    . fluticasone (FLONASE) 50 MCG/ACT nasal spray Place into both nostrils as needed.     . Ginkgo Biloba (GINKOBA PO) Take by mouth.    . Glucosamine 750 MG TABS Take by mouth.    . hydrocortisone-pramoxine (PRAMOSONE) 1-2.5 % LOTN Apply topically as needed.    [name]  ibuprofen (ADVIL,MOTRIN) 200 MG tablet Take 200 mg by mouth every 6 (six) hours as needed.    . Multiple Vitamin (MULTIVITAMIN) capsule Take 1 capsule by mouth daily.    . Omega-3 Fatty Acids (OMEGA 3 PO) Take by mouth.    . Probiotic Product (PROBIOTIC DAILY PO) Take by mouth.    . Psyllium (METAMUCIL PO) Take by mouth as needed.     No current facility-administered medications for this visit.    Review of Systems Review of Systems  Constitutional: Negative.   Respiratory: Negative.   Cardiovascular: Negative.   Gastrointestinal: Positive for abdominal pain. Negative for nausea, diarrhea and constipation.    Blood pressure 108/72, pulse 72, resp. rate 12, height 5\' 1"  (1.549 m), weight 104 lb (47.174 kg).  Physical Exam Physical Exam  Constitutional: She is oriented to person, place, and time. She appears well-developed and well-nourished.  Eyes: Conjunctivae are normal. No scleral icterus.  Neck: Neck supple.  Cardiovascular: Normal rate, regular rhythm and normal heart sounds.   Pulmonary/Chest: Effort normal and breath sounds normal.  Abdominal: Soft. Normal appearance and bowel sounds are normal. There is no tenderness. No hernia.  Lymphadenopathy:  She has no cervical adenopathy.  Neurological: She is alert and oriented to person, place, and time.  Skin: Skin is warm and dry.    Data Reviewed Office notes and MRI reviewed.  Assessment     Pt has had abdominal discomfort for up to 3yr at least. She has had normal MRI of abdomen. Prior colonoscopy was normal.   Her description of pain suggests a possible focal adhesion or remotely a Spigelian hernia. Plan    Discussed option of diagnostic laparoscopy with risk and benefits. She will decide and get back with [place] regarding her decisions.       SANKAR,SEEPLAPUTHUR G 12/27/2014, 6:47 AM

## 2014-12-27 ENCOUNTER — Encounter: Payer: Self-pay | Admitting: General Surgery

## 2014-12-30 DIAGNOSIS — E782 Mixed hyperlipidemia: Secondary | ICD-10-CM | POA: Diagnosis not present

## 2014-12-30 DIAGNOSIS — I10 Essential (primary) hypertension: Secondary | ICD-10-CM | POA: Diagnosis not present

## 2014-12-30 DIAGNOSIS — I2510 Atherosclerotic heart disease of native coronary artery without angina pectoris: Secondary | ICD-10-CM | POA: Diagnosis not present

## 2014-12-30 DIAGNOSIS — R002 Palpitations: Secondary | ICD-10-CM | POA: Diagnosis not present

## 2015-01-09 DIAGNOSIS — Z961 Presence of intraocular lens: Secondary | ICD-10-CM | POA: Diagnosis not present

## 2015-01-10 DIAGNOSIS — N182 Chronic kidney disease, stage 2 (mild): Secondary | ICD-10-CM | POA: Diagnosis not present

## 2015-01-10 DIAGNOSIS — M810 Age-related osteoporosis without current pathological fracture: Secondary | ICD-10-CM | POA: Diagnosis not present

## 2015-01-16 DIAGNOSIS — N8110 Cystocele, unspecified: Secondary | ICD-10-CM | POA: Diagnosis not present

## 2015-01-16 DIAGNOSIS — N859 Noninflammatory disorder of uterus, unspecified: Secondary | ICD-10-CM | POA: Diagnosis not present

## 2015-01-16 DIAGNOSIS — N951 Menopausal and female climacteric states: Secondary | ICD-10-CM | POA: Diagnosis not present

## 2015-01-27 DIAGNOSIS — R51 Headache: Secondary | ICD-10-CM | POA: Diagnosis not present

## 2015-01-27 DIAGNOSIS — J310 Chronic rhinitis: Secondary | ICD-10-CM | POA: Diagnosis not present

## 2015-01-27 DIAGNOSIS — J301 Allergic rhinitis due to pollen: Secondary | ICD-10-CM | POA: Diagnosis not present

## 2015-01-27 DIAGNOSIS — J0141 Acute recurrent pansinusitis: Secondary | ICD-10-CM | POA: Diagnosis not present

## 2015-02-14 DIAGNOSIS — S8002XA Contusion of left knee, initial encounter: Secondary | ICD-10-CM | POA: Diagnosis not present

## 2015-02-19 DIAGNOSIS — J301 Allergic rhinitis due to pollen: Secondary | ICD-10-CM | POA: Diagnosis not present

## 2015-02-19 DIAGNOSIS — R51 Headache: Secondary | ICD-10-CM | POA: Diagnosis not present

## 2015-02-26 DIAGNOSIS — N859 Noninflammatory disorder of uterus, unspecified: Secondary | ICD-10-CM | POA: Diagnosis not present

## 2015-07-09 DIAGNOSIS — I10 Essential (primary) hypertension: Secondary | ICD-10-CM | POA: Diagnosis not present

## 2015-07-09 DIAGNOSIS — I2510 Atherosclerotic heart disease of native coronary artery without angina pectoris: Secondary | ICD-10-CM | POA: Diagnosis not present

## 2015-07-09 DIAGNOSIS — E782 Mixed hyperlipidemia: Secondary | ICD-10-CM | POA: Diagnosis not present

## 2015-07-09 DIAGNOSIS — R002 Palpitations: Secondary | ICD-10-CM | POA: Diagnosis not present

## 2015-07-24 ENCOUNTER — Other Ambulatory Visit
Admission: RE | Admit: 2015-07-24 | Discharge: 2015-07-24 | Disposition: A | Discharge status: Home / Self Care | Payer: Medicare Other | Source: Ambulatory Visit | Attending: Ophthalmology | Admitting: Ophthalmology

## 2015-07-24 DIAGNOSIS — H5712 Ocular pain, left eye: Secondary | ICD-10-CM | POA: Insufficient documentation

## 2015-07-24 LAB — SEDIMENTATION RATE: Sed Rate: 7 mm/hr (ref 0–30)

## 2015-07-25 LAB — C-REACTIVE PROTEIN: CRP: <0.5 mg/dL (ref <1.0)

## 2015-08-14 DIAGNOSIS — N838 Other noninflammatory disorders of ovary, fallopian tube and broad ligament: Secondary | ICD-10-CM | POA: Insufficient documentation

## 2015-08-14 DIAGNOSIS — E785 Hyperlipidemia, unspecified: Secondary | ICD-10-CM | POA: Insufficient documentation

## 2015-08-14 DIAGNOSIS — N183 Chronic kidney disease, stage 3 unspecified: Secondary | ICD-10-CM | POA: Insufficient documentation

## 2015-08-14 DIAGNOSIS — I700 Atherosclerosis of aorta: Secondary | ICD-10-CM | POA: Insufficient documentation

## 2015-08-14 DIAGNOSIS — M810 Age-related osteoporosis without current pathological fracture: Secondary | ICD-10-CM | POA: Insufficient documentation

## 2015-08-14 DIAGNOSIS — M48061 Spinal stenosis, lumbar region without neurogenic claudication: Secondary | ICD-10-CM | POA: Insufficient documentation

## 2015-08-14 DIAGNOSIS — M069 Rheumatoid arthritis, unspecified: Secondary | ICD-10-CM | POA: Insufficient documentation

## 2015-08-15 ENCOUNTER — Ambulatory Visit
Admission: RE | Admit: 2015-08-15 | Discharge: 2015-08-15 | Disposition: A | Discharge status: Home / Self Care | Payer: Medicare Other | Source: Ambulatory Visit | Attending: Family Medicine | Admitting: Family Medicine

## 2015-08-15 ENCOUNTER — Encounter: Payer: Self-pay | Admitting: Family Medicine

## 2015-08-15 ENCOUNTER — Ambulatory Visit (INDEPENDENT_AMBULATORY_CARE_PROVIDER_SITE_OTHER): Payer: Medicare Other | Admitting: Family Medicine

## 2015-08-15 ENCOUNTER — Ambulatory Visit: Admission: RE | Admit: 2015-08-15 | Payer: Medicare Other | Source: Ambulatory Visit | Admitting: Family Medicine

## 2015-08-15 VITALS — BP 126/68 | HR 65 | Temp 97.8°F | Ht 60.5 in | Wt 105.0 lb

## 2015-08-15 DIAGNOSIS — S32020S Wedge compression fracture of second lumbar vertebra, sequela: Secondary | ICD-10-CM

## 2015-08-15 DIAGNOSIS — M8580 Other specified disorders of bone density and structure, unspecified site: Secondary | ICD-10-CM | POA: Diagnosis not present

## 2015-08-15 DIAGNOSIS — M4806 Spinal stenosis, lumbar region: Secondary | ICD-10-CM | POA: Diagnosis not present

## 2015-08-15 DIAGNOSIS — M47896 Other spondylosis, lumbar region: Secondary | ICD-10-CM | POA: Diagnosis not present

## 2015-08-15 DIAGNOSIS — Z23 Encounter for immunization: Secondary | ICD-10-CM | POA: Diagnosis not present

## 2015-08-15 DIAGNOSIS — M545 Low back pain, unspecified: Secondary | ICD-10-CM

## 2015-08-15 DIAGNOSIS — R350 Frequency of micturition: Secondary | ICD-10-CM

## 2015-08-15 DIAGNOSIS — M546 Pain in thoracic spine: Secondary | ICD-10-CM

## 2015-08-15 DIAGNOSIS — S32020A Wedge compression fracture of second lumbar vertebra, initial encounter for closed fracture: Secondary | ICD-10-CM

## 2015-08-15 DIAGNOSIS — G8929 Other chronic pain: Secondary | ICD-10-CM | POA: Diagnosis not present

## 2015-08-15 DIAGNOSIS — M79604 Pain in right leg: Secondary | ICD-10-CM

## 2015-08-15 DIAGNOSIS — M48061 Spinal stenosis, lumbar region without neurogenic claudication: Secondary | ICD-10-CM

## 2015-08-15 DIAGNOSIS — M4856XA Collapsed vertebra, not elsewhere classified, lumbar region, initial encounter for fracture: Secondary | ICD-10-CM | POA: Diagnosis not present

## 2015-08-15 HISTORY — DX: Wedge compression fracture of second lumbar vertebra, initial encounter for closed fracture: S32.020A

## 2015-08-15 LAB — UA/M W/RFLX CULTURE, ROUTINE
Bilirubin, UA: NEGATIVE
Glucose, UA: NEGATIVE
Ketones, UA: NEGATIVE
Leukocytes, UA: NEGATIVE
Nitrite, UA: NEGATIVE
Protein, UA: NEGATIVE
RBC, UA: NEGATIVE
Specific Gravity, UA: 1.005 (ref 1.005–1.030)
Urobilinogen, Ur: 0.2 mg/dL (ref 0.2–1.0)
pH, UA: 6 (ref 5.0–7.5)

## 2015-08-15 MED ORDER — TRAMADOL HCL 50 MG PO TABS: 50.0000 mg | ORAL_TABLET | Freq: Four times a day (QID) | ORAL | Status: DC | PRN

## 2015-08-15 NOTE — Assessment & Plan Note (Signed)
Start with plain films and then get MRI of the lumbar spine

## 2015-08-15 NOTE — Assessment & Plan Note (Signed)
Imaging orders; tramadol prescribed

## 2015-08-15 NOTE — Progress Notes (Signed)
BP 126/68 mmHg  Pulse 65  Temp(Src) 97.8 F (36.6 C)  Ht 5' 0.5" (1.537 m)  Wt 105 lb (47.628 kg)  BMI 20.16 kg/m2  SpO2 96%   Subjective:    Patient ID: [name], female    DOB: 17-Mar-1927, 81 y.o.   MRN: [id number]  HPI: [name] is a 81 y.o. female  Chief Complaint  Patient presents with  . Abdominal Pain    right side x "for sometime" but worse the last few weeks.  . Hip Pain    right side as well, thought it was maybe sciatica, but went to chiropractor yesterday and had it stretched   Patient is here for evaluation of abdominal pain Location:  Right side Radiation: around to the back, and then down into the right buttock region; chiropractor said it is NOT her sciatic nerve Quality:  achey Onset:  Gradual; but the new pain in her right buttock came on suddenly last Friday Progression: gets worse through the day, to the point she can't walk; in misery Severity:  Goes up to an 8 out of 10 in severity Duration:  ongoing Timing: worse through the day Chronicity: abdominal for 15-18 months or more, little more pressure; the radiation into the buttock started severely last Friday Association: no B/B dysfunction Alleviating factors: bending forward takes the pressure off; taking ibuprofen, brings the pain down, but wears off and pain is right back; laying down helps; using ice packs Exacerbating factors: standing, doing any activity, getting out of the bed She did end up doing the colonoscopy She saw Dr. Jamal Collin and he checked her abdomen; he was going to make an incision and use a mirror to go in and see if anything there No weakness in the legs; chiropractor checked her reflexes and everything was good  She has had some pressure on and off for months; saw the eye doctor and he ran a blood test to see if any infection in her body, and there wasn't; he thought it was sinuses in march and they did a study and it wasn't; eye doctor said to go back to the  ENT  Relevant past medical, surgical, family and social history reviewed and updated as indicated. Interim medical history since our last visit reviewed. Allergies and medications reviewed and updated.  Review of Systems  Per HPI unless specifically indicated above     Objective:    BP 126/68 mmHg  Pulse 65  Temp(Src) 97.8 F (36.6 C)  Ht 5' 0.5" (1.537 m)  Wt 105 lb (47.628 kg)  BMI 20.16 kg/m2  SpO2 96%  Wt Readings from Last 3 Encounters:  08/15/15 105 lb (47.628 kg)  12/13/14 104 lb (47.174 kg)  12/26/14 104 lb (47.174 kg)    Physical Exam  Results for orders placed or performed in visit on 08/14/15  HM MAMMOGRAPHY  Result Value Ref Range   HM Mammogram per PP   HM DEXA SCAN  Result Value Ref Range   HM Dexa Scan per PP   HM COLONOSCOPY  Result Value Ref Range   HM Colonoscopy per PP       Assessment & Plan:   Problem List Items Addressed This Visit    None    Visit Diagnoses    Urine frequency    -  Primary    Relevant Orders    UA/M w/rflx Culture, Routine        Follow up plan: No Follow-up on file.

## 2015-08-15 NOTE — Patient Instructions (Addendum)
Try limiting the caffeine and chocolate to see if that helps bladder irritability Please do have the xrays done at [facility] Try the new pain medicine; do not drive if it makes you loopy or goofy or unsteady Fall precautions to avoid falls Ibuprofen and Tylenol okay per package directions Try turmeric as a natural anti-inflammatory (for pain and arthritis). It comes in capsules where you buy aspirin and fish oil, but also as a spice where you buy pepper and garlic powder. We'll plan to get MRIs after the xray results come back If you have not heard anything from my staff in a week about any orders/referrals/studies from today, please contact us here to follow-up [phone] If the abdominal discomfort gets worse, go back to see Dr. Jamal Collin Call with an update in one week Stay well-hydrated and get fiber to help prevent constipation Take Miralax or colace to help prevent constipation if needed

## 2015-08-19 ENCOUNTER — Telehealth: Payer: Self-pay | Admitting: Family Medicine

## 2015-08-19 DIAGNOSIS — M4854XA Collapsed vertebra, not elsewhere classified, thoracic region, initial encounter for fracture: Secondary | ICD-10-CM | POA: Insufficient documentation

## 2015-08-19 HISTORY — DX: Collapsed vertebra, not elsewhere classified, thoracic region, initial encounter for fracture: M48.54XA

## 2015-08-19 MED ORDER — CALCITONIN (SALMON) 200 UNIT/ACT NA SOLN: 1.0000 | Freq: Every day | NASAL | Status: DC

## 2015-08-19 NOTE — Telephone Encounter (Signed)
I left detailed message; will get MRI of thoracic spine to see if new compression fracture Explained xray reports Let's start miacalcin, ALTERNATE nostrils, talk to pharmacist with any questions

## 2015-09-11 ENCOUNTER — Other Ambulatory Visit: Payer: Medicare Other

## 2015-09-17 ENCOUNTER — Ambulatory Visit
Admission: RE | Admit: 2015-09-17 | Discharge: 2015-09-17 | Disposition: A | Discharge status: Home / Self Care | Payer: Medicare Other | Source: Ambulatory Visit | Attending: Family Medicine | Admitting: Family Medicine

## 2015-09-17 DIAGNOSIS — M4854XA Collapsed vertebra, not elsewhere classified, thoracic region, initial encounter for fracture: Secondary | ICD-10-CM | POA: Insufficient documentation

## 2015-09-17 DIAGNOSIS — M47814 Spondylosis without myelopathy or radiculopathy, thoracic region: Secondary | ICD-10-CM | POA: Insufficient documentation

## 2015-09-17 DIAGNOSIS — S22010A Wedge compression fracture of first thoracic vertebra, initial encounter for closed fracture: Secondary | ICD-10-CM | POA: Diagnosis not present

## 2015-09-18 ENCOUNTER — Telehealth: Payer: Self-pay | Admitting: Family Medicine

## 2015-09-18 NOTE — Telephone Encounter (Signed)
Phone call Discussed with patient MRI with no acute changes Patient still having problems and Dr. Sanda Klein will discuss further

## 2015-10-07 ENCOUNTER — Encounter: Payer: Self-pay | Admitting: Family Medicine

## 2015-10-30 ENCOUNTER — Telehealth: Payer: Self-pay | Admitting: Family Medicine

## 2015-10-30 NOTE — Telephone Encounter (Signed)
Pt would like to let you know that she is fine and she will and call if she needs you. She is still having pain but won't schedule an appt unless she gets worse. She is scheduled to come in for cpe the beginning of February.

## 2015-12-12 DIAGNOSIS — L821 Other seborrheic keratosis: Secondary | ICD-10-CM | POA: Diagnosis not present

## 2015-12-12 DIAGNOSIS — X32XXXA Exposure to sunlight, initial encounter: Secondary | ICD-10-CM | POA: Diagnosis not present

## 2015-12-12 DIAGNOSIS — L600 Ingrowing nail: Secondary | ICD-10-CM | POA: Diagnosis not present

## 2015-12-12 DIAGNOSIS — D225 Melanocytic nevi of trunk: Secondary | ICD-10-CM | POA: Diagnosis not present

## 2015-12-12 DIAGNOSIS — L570 Actinic keratosis: Secondary | ICD-10-CM | POA: Diagnosis not present

## 2015-12-18 ENCOUNTER — Ambulatory Visit (INDEPENDENT_AMBULATORY_CARE_PROVIDER_SITE_OTHER): Payer: Medicare Other | Admitting: Family Medicine

## 2015-12-18 ENCOUNTER — Encounter: Payer: Self-pay | Admitting: Family Medicine

## 2015-12-18 VITALS — BP 148/76 | HR 63 | Temp 97.3°F | Ht 60.0 in | Wt 105.0 lb

## 2015-12-18 DIAGNOSIS — Z0000 Encounter for general adult medical examination without abnormal findings: Secondary | ICD-10-CM | POA: Diagnosis not present

## 2015-12-18 DIAGNOSIS — N183 Chronic kidney disease, stage 3 unspecified: Secondary | ICD-10-CM

## 2015-12-18 DIAGNOSIS — M069 Rheumatoid arthritis, unspecified: Secondary | ICD-10-CM

## 2015-12-18 DIAGNOSIS — R739 Hyperglycemia, unspecified: Secondary | ICD-10-CM | POA: Diagnosis not present

## 2015-12-18 DIAGNOSIS — I700 Atherosclerosis of aorta: Secondary | ICD-10-CM | POA: Diagnosis not present

## 2015-12-18 DIAGNOSIS — E785 Hyperlipidemia, unspecified: Secondary | ICD-10-CM

## 2015-12-18 DIAGNOSIS — Z5181 Encounter for therapeutic drug level monitoring: Secondary | ICD-10-CM

## 2015-12-18 DIAGNOSIS — S32020S Wedge compression fracture of second lumbar vertebra, sequela: Secondary | ICD-10-CM | POA: Diagnosis not present

## 2015-12-18 DIAGNOSIS — R39198 Other difficulties with micturition: Secondary | ICD-10-CM | POA: Diagnosis not present

## 2015-12-18 NOTE — Progress Notes (Signed)
Patient: [name], Female    DOB: 19-Jul-1927, 82 y.o.   MRN: [id number]  Visit Date: 01/03/2016  Today's Provider: Enid Derry, MD   Chief Complaint  Patient presents with  . Annual Exam    Medicare Wellness Exam    Subjective:   [name] is a 82 y.o. female who presents today for her Subsequent Annual Wellness Visit.  Caregiver input:  N/a  She sees Dr. Gustavo Lah (GI), esophageal spasm, no colonoscopy done; last summer Just saw derm and had lesions frozen last week, nose and back Saw heart doctor; Dr. Nehemiah Massed; had stress test and he was pleased; does next week or week or after for 6 months f/u  had sciatic problems; terrible pain, just went away on its own  USPSTF grade A and B recommendations Alcohol: n/a Depression: Depression screen [facility] 2/9 12/18/2015 08/15/2015  Decreased Interest 0 0  Down, Depressed, Hopeless 0 0  PHQ - 2 Score 0 0    Hypertension: controlled; she is usually eeven lower Obesity: no Tobacco use: never  HIV, hep B, hep C: politely declined Lipids: sometime soon Glucose:  Sometime soon Colorectal cancer: n/a Breast cancer: just done 12/26/2014 Lung cancer: n/a Osteoporosis: UTD; cannot take Evista or bisphosphonates; did not want nasal spray Fall prevention/vitamin D: discused AAA: n/a Aspirin: not taking, bad bruising with it; occasional Diet: good eater Exercise: active Skin cancer: sees derm  Urine flow is slower at night; drinks decaf at night; no odor; no burning  HPI  Review of Systems  Past Medical History  Diagnosis Date  . Arthritis   . RA (rheumatoid arthritis) ([place])   . Chronic kidney disease     stage 2  . Spinal stenosis   . Diverticulosis 2008  . Atherosclerosis   . Hyperlipidemia   . Osteoporosis   . Rheumatoid arthritis ([place])   . Ovarian lump   . Atherosclerosis of abdominal aorta ([place])   . Lumbar spinal stenosis   . CKD (chronic kidney disease) stage 2, GFR 60-89 ml/min    Past Surgical  History  Procedure Laterality Date  . Excision of breast biopsy Left 1965    benign  . Colonoscopy  2008    Dr. Donnella Sham  . Cholecystectomy  2005    [facility]    Family History  Problem Relation Age of Onset  . Cancer Brother     lung, bladder  . Alcohol abuse Brother   . Stroke Brother   . Heart disease Brother   . Hypertension Brother   . Hypertension Father   . Diabetes Sister   . Alcohol abuse Sister   . Kidney disease Sister     Social History   Social History  . Marital Status: Widowed    Spouse Name: N/A  . Number of Children: N/A  . Years of Education: N/A   Occupational History  . Not on file.   Social History Main Topics  . Smoking status: Never Smoker   . Smokeless tobacco: Never Used  . Alcohol Use: No  . Drug Use: No  . Sexual Activity: Not on file   Other Topics Concern  . Not on file   Social History Narrative    Outpatient Encounter Prescriptions as of 12/18/2015  Medication Sig Note  . Ascorbic Acid (VITAMIN C CR) 500 MG CPCR Take 500 mg by mouth daily. 08/14/2015: Received from: [place]: Take by mouth.  [name] aspirin 81 MG tablet Take 81 mg by mouth  daily as needed. Reported on 12/18/2015 12/18/2015: Patient states she does not take it routinely.  Valente David Yeast 487.5 MG TABS Take by mouth daily. 08/14/2015: Received from: [place]: Take by mouth.  . Calcium Carbonate-Vitamin D (CALCIUM + D PO) Take 1 tablet by mouth daily.    . Cholecalciferol (VITAMIN D3) 3000 UNITS TABS Take by mouth daily.    . Ginkgo Biloba (GINKOBA PO) Take 40 mg by mouth daily.    . Glucosamine Sulfate 500 MG CAPS Take 500 mg by mouth daily.   [name] ibuprofen (ADVIL,MOTRIN) 200 MG tablet Take 200 mg by mouth every 6 (six) hours as needed.   . Lactobacillus (PROBIOTIC ACIDOPHILUS PO) Take by mouth daily. 08/14/2015: Received from: [place]: Take by mouth.  . Multiple Vitamin  (MULTIVITAMIN) capsule Take 1 capsule by mouth daily.   . Omega-3 Fatty Acids (OMEGA 3 PO) Take by mouth daily.    . Psyllium (METAMUCIL PO) Take by mouth daily.    . vitamin B-12 (CYANOCOBALAMIN) 1000 MCG tablet Take 1,000 mcg by mouth daily. 08/15/2015: Received from: [place]: Take by mouth.  . [DISCONTINUED] calcitonin, salmon, (MIACALCIN) 200 UNIT/ACT nasal spray Place 1 spray into alternate nostrils daily. Left nostril one day, right nostril the next day, etc. (Patient not taking: Reported on 12/18/2015)   . [DISCONTINUED] econazole nitrate 1 % cream Apply topically daily. Reported on 12/18/2015   . [DISCONTINUED] fluticasone (FLONASE) 50 MCG/ACT nasal spray Place 2 sprays into both nostrils as needed. Reported on 12/18/2015   . [DISCONTINUED] traMADol (ULTRAM) 50 MG tablet Take 1 tablet (50 mg total) by mouth every 6 (six) hours as needed. (Patient not taking: Reported on 12/18/2015)    No facility-administered encounter medications on file as of 12/18/2015.    Functional Ability / Safety Screening 1.  Was the timed Get Up and Go test longer than 30 seconds?  no 2.  Does the patient need help with the phone, transportation, shopping,      preparing meals, housework, laundry, medications, or managing money?  no 3.  Does the patient's home have:  loose throw rugs in the hallway?   no      Grab bars in the bathroom? no      Handrails on the stairs?   no      Poor lighting?   no 4.  Has the patient noticed any hearing difficulties?   yes; not sure; think her daughter has hearing issues, she and her daughter kind of speak softly; she might do a free hearing test  Fall Risk Assessment See under rooming  Depression Screen See under rooming Depression screen [facility] 2/9 12/18/2015 08/15/2015  Decreased Interest 0 0  Down, Depressed, Hopeless 0 0  PHQ - 2 Score 0 0   Advanced Directives Does patient have a HCPOA?    yes If yes, name and contact information: Robert Bellow, Renard Matter;  Eustaquio Maize is moving in with her Does patient have a living will or MOST form?  yes  Objective:   Vitals: BP 148/76 mmHg  Pulse 63  Temp(Src) 97.3 F (36.3 C)  Ht 5' (1.524 m)  Wt 105 lb (47.628 kg)  BMI 20.51 kg/m2  SpO2 97% Body mass index is 20.51 kg/(m^2).  Visual Acuity Screening   Right eye Left eye Both eyes  Without correction: 20/20 20/20   With correction:       Physical Exam Mood/affect:  Euthymic,  very pleasant Appearance:  Casually dressed, neat  Cognitive Testing - 6-CIT  Correct? Score   What year is it? yes 0 Yes = 0    No = 4  What month is it? yes 0 Yes = 0    No = 3  Remember:     Pia Mau, [place], [place]     What time is it? yes 0 Yes = 0    No = 3  Count backwards from 20 to 1 yes 0 Correct = 0    1 error = 2   More than 1 error = 4  Say the months of the year in reverse. yes 0 Correct = 0    1 error = 2   More than 1 error = 4  What address did I ask you to remember? yes 0 Correct = 0  1 error = 2    2 error = 4    3 error = 6    4 error = 8    All wrong = 10       TOTAL SCORE  0/28   Interpretation:  Normal  Normal (0-7) Abnormal (8-28)    Assessment & Plan:     Annual Wellness Visit  Reviewed patient's Family Medical History Reviewed and updated list of patient's medical providers Assessment of cognitive impairment was done Assessed patient's functional ability Established a written schedule for health screening [place] Completed and Reviewed  Exercise Activities and Dietary recommendations  Immunization History  Administered Date(s) Administered  . Influenza,inj,Quad PF,36+ Mos 08/15/2015  . Pneumococcal Conjugate-13 12/13/2014  . Pneumococcal Polysaccharide-23 11/08/2000  . Tdap 11/08/2008  . Zoster 11/08/2008   Health Maintenance  Topic Date Due  . INFLUENZA VACCINE  06/08/2016  . TETANUS/TDAP  11/08/2018  . DEXA SCAN  Completed  . ZOSTAVAX  Completed  . PNA vac Low Risk  Adult  Completed   Discussed health benefits of physical activity, and encouraged her to engage in regular exercise appropriate for her age and condition.    Current outpatient prescriptions:  .  Ascorbic Acid (VITAMIN C CR) 500 MG CPCR, Take 500 mg by mouth daily., Disp: , Rfl:  .  aspirin 81 MG tablet, Take 81 mg by mouth daily as needed. Reported on 12/18/2015, Disp: , Rfl:  .  Brewers Yeast 487.5 MG TABS, Take by mouth daily., Disp: , Rfl:  .  Calcium Carbonate-Vitamin D (CALCIUM + D PO), Take 1 tablet by mouth daily. , Disp: , Rfl:  .  Cholecalciferol (VITAMIN D3) 3000 UNITS TABS, Take by mouth daily. , Disp: , Rfl:  .  Ginkgo Biloba (GINKOBA PO), Take 40 mg by mouth daily. , Disp: , Rfl:  .  Glucosamine Sulfate 500 MG CAPS, Take 500 mg by mouth daily., Disp: , Rfl:  .  ibuprofen (ADVIL,MOTRIN) 200 MG tablet, Take 200 mg by mouth every 6 (six) hours as needed., Disp: , Rfl:  .  Lactobacillus (PROBIOTIC ACIDOPHILUS PO), Take by mouth daily., Disp: , Rfl:  .  Multiple Vitamin (MULTIVITAMIN) capsule, Take 1 capsule by mouth daily., Disp: , Rfl:  .  Omega-3 Fatty Acids (OMEGA 3 PO), Take by mouth daily. , Disp: , Rfl:  .  Psyllium (METAMUCIL PO), Take by mouth daily. , Disp: , Rfl:  .  vitamin B-12 (CYANOCOBALAMIN) 1000 MCG tablet, Take 1,000 mcg by mouth daily., Disp: , Rfl:  Medications Discontinued During This Encounter  Medication Reason  . calcitonin, salmon, (  MIACALCIN) 200 UNIT/ACT nasal spray Patient Preference  . fluticasone (FLONASE) 50 MCG/ACT nasal spray Patient has not taken in last 30 days  . econazole nitrate 1 % cream Patient has not taken in last 30 days  . traMADol (ULTRAM) 50 MG tablet Patient has not taken in last 30 days   Next Medicare Wellness Visit in 12+ months  Urinalysis  Problem List Items Addressed This Visit      Cardiovascular and Mediastinum   Atherosclerosis of abdominal aorta (HCC)    Monitor lipid panel; limit saturated fats      Relevant Orders    Lipid Panel w/o Chol/HDL Ratio (Completed)     Musculoskeletal and Integument   Rheumatoid arthritis ([place])    Check labs      Relevant Orders   CBC with Differential/Platelet (Completed)   Compression fracture of L2 lumbar vertebra (HCC)    Check vit D, Ca2+      Relevant Orders   VITAMIN D 25 Hydroxy (Vit-D Deficiency, Fractures) (Completed)     Genitourinary   Chronic kidney disease, stage III (moderate)    Check creatinine; avoid NSAIDs        Other   Hyperlipidemia - Primary    Last lipid panel July 2015 reviewed; excellent      Relevant Orders   Lipid Panel w/o Chol/HDL Ratio (Completed)   Hyperglycemia   Relevant Orders   Hgb A1c w/o eAG (Completed)   Subjective change in urination   Relevant Orders   UA/M w/rflx Culture, Routine (Completed)    Other Visit Diagnoses    Medication monitoring encounter        Relevant Orders    Comprehensive metabolic panel (Completed)      Orders Placed This Encounter  Procedures  . CBC with Differential/Platelet  . Hgb A1c w/o eAG  . Lipid Panel w/o Chol/HDL Ratio  . Comprehensive metabolic panel  . UA/M w/rflx Culture, Routine  . VITAMIN D 25 Hydroxy (Vit-D Deficiency, Fractures)

## 2015-12-18 NOTE — Patient Instructions (Addendum)
Your next bone density test will be February of 2018 You can have a mammogram every two years Return for fasting labs tomorrow or next week Read about Prolia --> call me if you would like to see an endocrinologist about getting this shot twice a year if you are interested  Bone Health Bones protect organs, store calcium, and anchor muscles. Good health habits, such as eating nutritious foods and exercising regularly, are important for maintaining healthy bones. They can also help to prevent a condition that causes bones to lose density and become weak and brittle (osteoporosis). WHY IS BONE MASS IMPORTANT? Bone mass refers to the amount of bone tissue that you have. The higher your bone mass, the stronger your bones. An important step toward having healthy bones throughout life is to have strong and dense bones during childhood. A young adult who has a high bone mass is more likely to have a high bone mass later in life. Bone mass at its greatest it is called peak bone mass. A large decline in bone mass occurs in older adults. In women, it occurs about the time of menopause. During this time, it is important to practice good health habits, because if more bone is lost than what is replaced, the bones will become less healthy and more likely to break (fracture). If you find that you have a low bone mass, you may be able to prevent osteoporosis or further bone loss by changing your diet and lifestyle. HOW CAN I FIND OUT IF MY BONE MASS IS LOW? Bone mass can be measured with an X-ray test that is called a bone mineral density (BMD) test. This test is recommended for all women who are age 956 or older. It may also be recommended for men who are age 70 or older, or for people who are more likely to develop osteoporosis due to:  Having bones that break easily.  Having a long-term disease that weakens bones, such as kidney disease or rheumatoid arthritis.  Having menopause earlier than normal.  Taking  medicine that weakens bones, such as steroids, thyroid hormones, or hormone treatment for breast cancer or prostate cancer.  Smoking.  Drinking three or more alcoholic drinks each day. WHAT ARE THE NUTRITIONAL RECOMMENDATIONS FOR HEALTHY BONES? To have healthy bones, you need to get enough of the right minerals and vitamins. Most nutrition experts recommend getting these nutrients from the foods that you eat. Nutritional recommendations vary from person to person. Ask your health care provider what is healthy for you. Here are some general guidelines. Calcium Recommendations Calcium is the most important (essential) mineral for bone health. Most people can get enough calcium from their diet, but supplements may be recommended for people who are at risk for osteoporosis. Good sources of calcium include:  Dairy products, such as low-fat or nonfat milk, cheese, and yogurt.  Dark green leafy vegetables, such as bok choy and broccoli.  Calcium-fortified foods, such as orange juice, cereal, bread, soy beverages, and tofu products.  Nuts, such as almonds. Follow these recommended amounts for daily calcium intake:  Children, age 71-3: 700 mg.  Children, age 95-8: 1,000 mg.  Children, age 53-13: 1,300 mg.  Teens, age 78-18: 1,300 mg.  Adults, age 39-50: 1,000 mg.  Adults, age 28-70:  Men: 1,000 mg.  Women: 1,200 mg.  Adults, age 60 or older: 1,200 mg.  Pregnant and breastfeeding females:  Teens: 1,300 mg.  Adults: 1,000 mg. Vitamin D Recommendations Vitamin D is the most essential vitamin  for bone health. It helps the body to absorb calcium. Sunlight stimulates the skin to make vitamin D, so be sure to get enough sunlight. If you live in a cold climate or you do not get outside often, your health care provider may recommend that you take vitamin D supplements. Good sources of vitamin D in your diet include:  Egg yolks.  Saltwater fish.  Milk and cereal fortified with vitamin  D. Follow these recommended amounts for daily vitamin D intake:  Children and teens, age 42-18: 600 international units.  Adults, age 59 or younger: 400-800 international units.  Adults, age 70 or older: 800-1,000 international units. Other Nutrients Other nutrients for bone health include:  Phosphorus. This mineral is found in meat, poultry, dairy foods, nuts, and legumes. The recommended daily intake for adult men and adult women is 700 mg.  Magnesium. This mineral is found in seeds, nuts, dark green vegetables, and legumes. The recommended daily intake for adult men is 400-420 mg. For adult women, it is 310-320 mg.  Vitamin K. This vitamin is found in green leafy vegetables. The recommended daily intake is 120 mg for adult men and 90 mg for adult women. WHAT TYPE OF PHYSICAL ACTIVITY IS BEST FOR BUILDING AND MAINTAINING HEALTHY BONES? Weight-bearing and strength-building activities are important for building and maintaining peak bone mass. Weight-bearing activities cause muscles and bones to work against gravity. Strength-building activities increases muscle strength that supports bones. Weight-bearing and muscle-building activities include:  Walking and hiking.  Jogging and running.  Dancing.  Gym exercises.  Lifting weights.  Tennis and racquetball.  Climbing stairs.  Aerobics. Adults should get at least 30 minutes of moderate physical activity on most days. Children should get at least 60 minutes of moderate physical activity on most days. Ask your health care provide what type of exercise is best for you. WHERE CAN I FIND MORE INFORMATION? For more information, check out the following websites:  [place]: YardHomes.se  [employer]: http://www.niams.AnonymousEar.fr.asp   This information is not intended to replace advice given to you by your health care provider. Make sure  you discuss any questions you have with your health care provider.   Document Released: 01/15/2004 Document Revised: 03/11/2015 Document Reviewed: 10/30/2014 Elsevier Interactive Patient Education [employer]. Menopause is a normal process in which your reproductive ability comes to an end. This process happens gradually over a span of months to years, usually between the ages of 29 and 77. Menopause is complete when you have missed 12 consecutive menstrual periods. It is important to talk with your health care provider about some of the most common conditions that affect postmenopausal women, such as heart disease, cancer, and bone loss (osteoporosis). Adopting a healthy lifestyle and getting preventive care can help to promote your health and wellness. Those actions can also lower your chances of developing some of these common conditions. WHAT SHOULD I KNOW ABOUT MENOPAUSE? During menopause, you may experience a number of symptoms, such as:  Moderate-to-severe hot flashes.  Night sweats.  Decrease in sex drive.  Mood swings.  Headaches.  Tiredness.  Irritability.  Memory problems.  Insomnia. Choosing to treat or not to treat menopausal changes is an individual decision that you make with your health care provider. WHAT SHOULD I KNOW ABOUT HORMONE REPLACEMENT THERAPY AND SUPPLEMENTS? Hormone therapy products are effective for treating symptoms that are associated with menopause, such as hot flashes and night sweats. Hormone replacement carries certain risks, especially as you  become older. If you are thinking about using estrogen or estrogen with progestin treatments, discuss the benefits and risks with your health care provider. WHAT SHOULD I KNOW ABOUT HEART DISEASE AND STROKE? Heart disease, heart attack, and stroke become more likely as you age. This may be due, in part, to the hormonal changes that your body experiences during menopause. These can affect how your body  processes dietary fats, triglycerides, and cholesterol. Heart attack and stroke are both medical emergencies. There are many things that you can do to help prevent heart disease and stroke:  Have your blood pressure checked at least every 1-2 years. High blood pressure causes heart disease and increases the risk of stroke.  If you are 57-8 years old, ask your health care provider if you should take aspirin to prevent a heart attack or a stroke.  Do not use any tobacco products, including cigarettes, chewing tobacco, or electronic cigarettes. If you need help quitting, ask your health care provider.  It is important to eat a healthy diet and maintain a healthy weight.  Be sure to include plenty of vegetables, fruits, low-fat dairy products, and lean protein.  Avoid eating foods that are high in solid fats, added sugars, or salt (sodium).  Get regular exercise. This is one of the most important things that you can do for your health.  Try to exercise for at least 150 minutes each week. The type of exercise that you do should increase your heart rate and make you sweat. This is known as moderate-intensity exercise.  Try to do strengthening exercises at least twice each week. Do these in addition to the moderate-intensity exercise.  Know your numbers.Ask your health care provider to check your cholesterol and your blood glucose. Continue to have your blood tested as directed by your health care provider. WHAT SHOULD I KNOW ABOUT CANCER SCREENING? There are several types of cancer. Take the following steps to reduce your risk and to catch any cancer development as early as possible. Breast Cancer  Practice breast self-awareness.  This means understanding how your breasts normally appear and feel.  It also means doing regular breast self-exams. Let your health care provider know about any changes, no matter how small.  If you are 39 or older, have a clinician do a breast exam (clinical  breast exam or CBE) every year. Depending on your age, family history, and medical history, it may be recommended that you also have a yearly breast X-ray (mammogram).  If you have a family history of breast cancer, talk with your health care provider about genetic screening.  If you are at high risk for breast cancer, talk with your health care provider about having an MRI and a mammogram every year.  Breast cancer (BRCA) gene test is recommended for women who have family members with BRCA-related cancers. Results of the assessment will determine the need for genetic counseling and BRCA1 and for BRCA2 testing. BRCA-related cancers include these types:  Breast. This occurs in males or females.  Ovarian.  Tubal. This may also be called fallopian tube cancer.  Cancer of the abdominal or pelvic lining (peritoneal cancer).  Prostate.  Pancreatic. Cervical, Uterine, and Ovarian Cancer Your health care provider may recommend that you be screened regularly for cancer of the pelvic organs. These include your ovaries, uterus, and vagina. This screening involves a pelvic exam, which includes checking for microscopic changes to the surface of your cervix (Pap test).  For women ages 21-65, health care providers  may recommend a pelvic exam and a Pap test every three years. For women ages 42-65, they may recommend the Pap test and pelvic exam, combined with testing for human papilloma virus (HPV), every five years. Some types of HPV increase your risk of cervical cancer. Testing for HPV may also be done on women of any age who have unclear Pap test results.  Other health care providers may not recommend any screening for nonpregnant women who are considered low risk for pelvic cancer and have no symptoms. Ask your health care provider if a screening pelvic exam is right for you.  If you have had past treatment for cervical cancer or a condition that could lead to cancer, you need Pap tests and screening  for cancer for at least 20 years after your treatment. If Pap tests have been discontinued for you, your risk factors (such as having a new sexual partner) need to be reassessed to determine if you should start having screenings again. Some women have medical problems that increase the chance of getting cervical cancer. In these cases, your health care provider may recommend that you have screening and Pap tests more often.  If you have a family history of uterine cancer or ovarian cancer, talk with your health care provider about genetic screening.  If you have vaginal bleeding after reaching menopause, tell your health care provider.  There are currently no reliable tests available to screen for ovarian cancer. Lung Cancer Lung cancer screening is recommended for adults 30-60 years old who are at high risk for lung cancer because of a history of smoking. A yearly low-dose CT scan of the lungs is recommended if you:  Currently smoke.  Have a history of at least 30 pack-years of smoking and you currently smoke or have quit within the past 15 years. A pack-year is smoking an average of one pack of cigarettes per day for one year. Yearly screening should:  Continue until it has been 15 years since you quit.  Stop if you develop a health problem that would prevent you from having lung cancer treatment. Colorectal Cancer  This type of cancer can be detected and can often be prevented.  Routine colorectal cancer screening usually begins at age 84 and continues through age 42.  If you have risk factors for colon cancer, your health care provider may recommend that you be screened at an earlier age.  If you have a family history of colorectal cancer, talk with your health care provider about genetic screening.  Your health care provider may also recommend using home test kits to check for hidden blood in your stool.  A small camera at the end of a tube can be used to examine your colon  directly (sigmoidoscopy or colonoscopy). This is done to check for the earliest forms of colorectal cancer.  Direct examination of the colon should be repeated every 5-10 years until age 47. However, if early forms of precancerous polyps or small growths are found or if you have a family history or genetic risk for colorectal cancer, you may need to be screened more often. Skin Cancer  Check your skin from head to toe regularly.  Monitor any moles. Be sure to tell your health care provider:  About any new moles or changes in moles, especially if there is a change in a mole's shape or color.  If you have a mole that is larger than the size of a pencil eraser.  If any of your family  members has a history of skin cancer, especially at a young age, talk with your health care provider about genetic screening.  Always use sunscreen. Apply sunscreen liberally and repeatedly throughout the day.  Whenever you are outside, protect yourself by wearing long sleeves, pants, a wide-brimmed hat, and sunglasses. WHAT SHOULD I KNOW ABOUT OSTEOPOROSIS? Osteoporosis is a condition in which bone destruction happens more quickly than new bone creation. After menopause, you may be at an increased risk for osteoporosis. To help prevent osteoporosis or the bone fractures that can happen because of osteoporosis, the following is recommended:  If you are 67-81 years old, get at least 1,000 mg of calcium and at least 600 mg of vitamin D per day.  If you are older than age 68 but younger than age 48, get at least 1,200 mg of calcium and at least 600 mg of vitamin D per day.  If you are older than age 31, get at least 1,200 mg of calcium and at least 800 mg of vitamin D per day. Smoking and excessive alcohol intake increase the risk of osteoporosis. Eat foods that are rich in calcium and vitamin D, and do weight-bearing exercises several times each week as directed by your health care provider. WHAT SHOULD I KNOW  ABOUT HOW MENOPAUSE AFFECTS [place]? Depression may occur at any age, but it is more common as you become older. Common symptoms of depression include:  Low or sad mood.  Changes in sleep patterns.  Changes in appetite or eating patterns.  Feeling an overall lack of motivation or enjoyment of activities that you previously enjoyed.  Frequent crying spells. Talk with your health care provider if you think that you are experiencing depression. WHAT SHOULD I KNOW ABOUT IMMUNIZATIONS? It is important that you get and maintain your immunizations. These include:  Tetanus, diphtheria, and pertussis (Tdap) booster vaccine.  Influenza every year before the flu season begins.  Pneumonia vaccine.  Shingles vaccine. Your health care provider may also recommend other immunizations.   This information is not intended to replace advice given to you by your health care provider. Make sure you discuss any questions you have with your health care provider.   Document Released: 12/17/2005 Document Revised: 11/15/2014 Document Reviewed: 06/27/2014 Elsevier Interactive Patient Education [employer].

## 2015-12-19 ENCOUNTER — Other Ambulatory Visit: Payer: Medicare Other

## 2015-12-19 DIAGNOSIS — R739 Hyperglycemia, unspecified: Secondary | ICD-10-CM | POA: Diagnosis not present

## 2015-12-19 DIAGNOSIS — Z5181 Encounter for therapeutic drug level monitoring: Secondary | ICD-10-CM

## 2015-12-19 DIAGNOSIS — M069 Rheumatoid arthritis, unspecified: Secondary | ICD-10-CM

## 2015-12-19 DIAGNOSIS — S32020S Wedge compression fracture of second lumbar vertebra, sequela: Secondary | ICD-10-CM

## 2015-12-19 DIAGNOSIS — M810 Age-related osteoporosis without current pathological fracture: Secondary | ICD-10-CM | POA: Diagnosis not present

## 2015-12-19 DIAGNOSIS — R39198 Other difficulties with micturition: Secondary | ICD-10-CM | POA: Insufficient documentation

## 2015-12-19 DIAGNOSIS — I700 Atherosclerosis of aorta: Secondary | ICD-10-CM | POA: Diagnosis not present

## 2015-12-19 DIAGNOSIS — E785 Hyperlipidemia, unspecified: Secondary | ICD-10-CM | POA: Diagnosis not present

## 2015-12-19 NOTE — Assessment & Plan Note (Signed)
Check labs 

## 2015-12-19 NOTE — Assessment & Plan Note (Signed)
Check vit D, Ca2+

## 2015-12-19 NOTE — Assessment & Plan Note (Signed)
Last lipid panel July 2015 reviewed; excellent

## 2015-12-19 NOTE — Assessment & Plan Note (Signed)
Monitor lipid panel; limit saturated fats

## 2015-12-20 LAB — CBC WITH DIFFERENTIAL/PLATELET
Basophils Absolute: 0 10*3/uL (ref 0.0–0.2)
Basos: 0 %
EOS (ABSOLUTE): 0 10*3/uL (ref 0.0–0.4)
Eos: 1 %
Hematocrit: 43 % (ref 34.0–46.6)
Hemoglobin: 14.1 g/dL (ref 11.1–15.9)
Immature Grans (Abs): 0 10*3/uL (ref 0.0–0.1)
Immature Granulocytes: 0 %
Lymphocytes Absolute: 1.4 10*3/uL (ref 0.7–3.1)
Lymphs: 28 %
MCH: 29.4 pg (ref 26.6–33.0)
MCHC: 32.8 g/dL (ref 31.5–35.7)
MCV: 90 fL (ref 79–97)
Monocytes Absolute: 0.4 10*3/uL (ref 0.1–0.9)
Monocytes: 8 %
Neutrophils Absolute: 3 10*3/uL (ref 1.4–7.0)
Neutrophils: 63 %
Platelets: 217 10*3/uL (ref 150–379)
RBC: 4.79 x10E6/uL (ref 3.77–5.28)
RDW: 14.2 % (ref 12.3–15.4)
WBC: 4.8 10*3/uL (ref 3.4–10.8)

## 2015-12-20 LAB — LIPID PANEL W/O CHOL/HDL RATIO
Cholesterol, Total: 205 mg/dL — ABNORMAL HIGH (ref 100–199)
HDL: 84 mg/dL (ref >39)
LDL Calculated: 98 mg/dL (ref 0–99)
Triglycerides: 116 mg/dL (ref 0–149)
VLDL Cholesterol Cal: 23 mg/dL (ref 5–40)

## 2015-12-20 LAB — HGB A1C W/O EAG: Hgb A1c MFr Bld: 6.2 % — ABNORMAL HIGH (ref 4.8–5.6)

## 2015-12-20 LAB — COMPREHENSIVE METABOLIC PANEL
ALT: 15 IU/L (ref 0–32)
AST: 30 IU/L (ref 0–40)
Albumin/Globulin Ratio: 1.8 (ref 1.1–2.5)
Albumin: 4 g/dL (ref 3.5–4.7)
Alkaline Phosphatase: 50 IU/L (ref 39–117)
BUN/Creatinine Ratio: 18 (ref 11–26)
BUN: 15 mg/dL (ref 8–27)
Bilirubin Total: <0.2 mg/dL (ref 0.0–1.2)
CO2: 28 mmol/L (ref 18–29)
Calcium: 9.1 mg/dL (ref 8.7–10.3)
Chloride: 99 mmol/L (ref 96–106)
Creatinine, Ser: 0.84 mg/dL (ref 0.57–1.00)
GFR calc Af Amer: 73 mL/min/{1.73_m2} (ref >59)
GFR calc non Af Amer: 63 mL/min/{1.73_m2} (ref >59)
Globulin, Total: 2.2 g/dL (ref 1.5–4.5)
Glucose: 102 mg/dL — ABNORMAL HIGH (ref 65–99)
Potassium: 4 mmol/L (ref 3.5–5.2)
Sodium: 143 mmol/L (ref 134–144)
Total Protein: 6.2 g/dL (ref 6.0–8.5)

## 2015-12-20 LAB — VITAMIN D 25 HYDROXY (VIT D DEFICIENCY, FRACTURES): Vit D, 25-Hydroxy: 38.2 ng/mL (ref 30.0–100.0)

## 2015-12-21 LAB — UA/M W/RFLX CULTURE, ROUTINE
Bilirubin, UA: NEGATIVE
Glucose, UA: NEGATIVE
Ketones, UA: NEGATIVE
Nitrite, UA: NEGATIVE
Protein, UA: NEGATIVE
RBC, UA: NEGATIVE
Specific Gravity, UA: 1.02 (ref 1.005–1.030)
Urobilinogen, Ur: 0.2 mg/dL (ref 0.2–1.0)
pH, UA: 6 (ref 5.0–7.5)

## 2015-12-21 LAB — MICROSCOPIC EXAMINATION

## 2015-12-21 LAB — URINE CULTURE, REFLEX

## 2015-12-23 ENCOUNTER — Telehealth: Payer: Self-pay | Admitting: Family Medicine

## 2015-12-28 NOTE — Telephone Encounter (Signed)
Not sure why this note is open; closing encounter

## 2015-12-31 DIAGNOSIS — I2510 Atherosclerotic heart disease of native coronary artery without angina pectoris: Secondary | ICD-10-CM | POA: Diagnosis not present

## 2015-12-31 DIAGNOSIS — R0602 Shortness of breath: Secondary | ICD-10-CM | POA: Diagnosis not present

## 2015-12-31 DIAGNOSIS — I10 Essential (primary) hypertension: Secondary | ICD-10-CM | POA: Diagnosis not present

## 2015-12-31 DIAGNOSIS — R002 Palpitations: Secondary | ICD-10-CM | POA: Diagnosis not present

## 2015-12-31 DIAGNOSIS — E782 Mixed hyperlipidemia: Secondary | ICD-10-CM | POA: Diagnosis not present

## 2016-01-03 DIAGNOSIS — Z0000 Encounter for general adult medical examination without abnormal findings: Secondary | ICD-10-CM | POA: Insufficient documentation

## 2016-01-03 NOTE — Assessment & Plan Note (Signed)
USPSTF grade A and B recommendations reviewed with patient; age-appropriate recommendations, preventive care, screening tests, etc discussed and encouraged; healthy living encouraged; see AVS for patient education given to patient  

## 2016-01-03 NOTE — Assessment & Plan Note (Signed)
Check creatinine; avoid NSAIDs

## 2016-01-08 ENCOUNTER — Ambulatory Visit: Payer: Medicare Other | Admitting: Podiatry

## 2016-01-15 ENCOUNTER — Ambulatory Visit (INDEPENDENT_AMBULATORY_CARE_PROVIDER_SITE_OTHER): Payer: Medicare Other | Admitting: Podiatry

## 2016-01-15 ENCOUNTER — Encounter: Payer: Self-pay | Admitting: Podiatry

## 2016-01-15 VITALS — BP 157/77 | HR 63 | Resp 18

## 2016-01-15 DIAGNOSIS — L600 Ingrowing nail: Secondary | ICD-10-CM

## 2016-01-15 NOTE — Progress Notes (Signed)
Subjective:    Patient ID: Dayana Dalporto, female    DOB: 1927-05-29, 82 y.o.   MRN: [id number]  HPI  82 year old female presents the office today with her daughter for concerns of ingrown toenails of the right big toe on the lateral nail border. This is been ongoing for the last several weeks. She states that the area was infected in January. She was seen by dermatology who did trim the toenail and put a piece of cotton underneath the nail. This did help although does continue to become painful and red on the nail border. This time she could have the nail border removed. She has not had any drainage or pus although it is painful with pressure in shoe gear. Also on the left big toenail has started become ingrown again and she would like to have the nail trimmed. Denies any drainage or redness or any swelling.  Last A1c 6.2. She is not taking any medications for rheumatoid arthritis.   Review of Systems  All other systems reviewed and are negative.      Objective:   Physical Exam General: AAO x3, NAD  Dermatological: There is incurvation along the lateral aspect of the right hallux toenail tenderness to palpation. There is localized edema and erythema to the nail border. There is no drainage or pus. No red streaks are present. The nail does appear be somewhat dystrophic and discolored. There is also incurvation on the medial nail borders of left hallux toenail without any tenderness, drainage or other signs of infection. No open lesions identified this time.  Vascular: Dorsalis Pedis artery and Posterior Tibial artery pedal pulses are 2/4 bilateral with immedate capillary fill time. Pedal hair growth present. There is mild varicose veins present. There is no pain with calf compression, swelling, warmth, erythema.   Neruologic: Grossly intact via light touch bilateral. Vibratory intact via tuning fork bilateral. Protective threshold with Semmes Wienstein monofilament intact to all pedal  sites bilateral. Patellar and Achilles deep tendon reflexes 2+ bilateral. No Babinski or clonus noted bilateral.   Musculoskeletal: No gross boney pedal deformities bilateral. No pain, crepitus, or limitation noted with foot and ankle range of motion bilateral. Muscular strength 5/5 in all groups tested bilateral.  Gait: Unassisted, Nonantalgic.       Assessment & Plan:  82 year old female right lateral hallux symptomatic ingrown toenail; left hallux ingrown toenail -Treatment options discussed including all alternatives, risks, and complications -Etiology of symptoms were discussed -Hallux nails debrided without complications or bleeding to remove the symptomatic portion of the ingrown toenail. -At this time, the patient is requesting partial nail removal with chemical matricectomy to the symptomatic portion of the nail on the right.  Risks and complications were discussed with the patient for which they understand and  verbally consent to the procedure. Under sterile conditions a total of 3 mL of a mixture of 2% lidocaine plain and 0.5% Marcaine plain was infiltrated in a hallux block fashion. Once anesthetized, the skin was prepped in sterile fashion. A tourniquet was then applied. Next the lateral aspect of hallux nail border was then sharply excised making sure to remove the entire offending nail border. Once the nails were ensured to be removed area was debrided and the underlying skin was intact. There is no purulence identified in the procedure. Next phenol was then applied under standard conditions and copiously irrigated. Silvadene was applied. A dry sterile dressing was applied. After application of the dressing the tourniquet was removed and there is found  to be an immediate capillary refill time to the digit. The patient tolerated the procedure well any complications. Post procedure instructions were discussed the patient for which he verbally understood. Follow-up in one week for nail  check or sooner if any problems are to arise. Discussed signs/symptoms of infection and directed to call the office immediately should any occur or go directly to the emergency room. In the meantime, encouraged to call the office with any questions, concerns, changes symptoms.  Celesta Gentile, DPM

## 2016-01-22 ENCOUNTER — Ambulatory Visit (INDEPENDENT_AMBULATORY_CARE_PROVIDER_SITE_OTHER): Payer: Medicare Other | Admitting: Podiatry

## 2016-01-22 ENCOUNTER — Encounter: Payer: Self-pay | Admitting: Podiatry

## 2016-01-22 DIAGNOSIS — L600 Ingrowing nail: Secondary | ICD-10-CM

## 2016-01-22 DIAGNOSIS — Z9889 Other specified postprocedural states: Secondary | ICD-10-CM

## 2016-01-22 NOTE — Progress Notes (Signed)
Patient ID: [name], female   DOB: 10-Apr-1927, 82 y.o.   MRN: [id number]  Subjective: [name] is a 82 y.o.  female returns to office today for follow up evaluation after having right lateral hallux permanet partial nail avulsion performed. Patient has been soaking using epsom salt and applying topical antibiotic covered with bandaid daily. She states that she has not been consistent with soaking however. She denies any pain or drainage. She says overall she is very happy the way it is healing. Patient denies fevers, chills, nausea, vomiting. Denies any calf pain, chest pain, SOB.   Objective:  Vitals: Reviewed  General: Well developed, nourished, in no acute distress, alert and oriented x3   Dermatology: Skin is warm, dry and supple bilateral. Lateral hallux nail border appears to be clean, dry, with mild granular tissue and surrounding scab. There is no surrounding erythema, edema, drainage/purulence. The remaining nails appear unremarkable at this time. There are no other lesions or other signs of infection present.  Neurovascular status: Intact. No lower extremity swelling; No pain with calf compression bilateral.  Musculoskeletal: No tenderness to palpation of the lateral hallux nail fold. Muscular strength within normal limits bilateral.   Assesement and Plan: S/p partial nail avulsion, doing well.   -Continue soaking in epsom salts twice a day followed by antibiotic ointment and a band-aid. Can leave uncovered at night. Continue this until completely healed.  -If the area has not healed in 2 weeks, call the office for follow-up appointment, or sooner if any problems arise.  -Monitor for any signs/symptoms of infection. Call the office immediately if any occur or go directly to the emergency room. Call with any questions/concerns.  Celesta Gentile, DPM

## 2016-01-22 NOTE — Patient Instructions (Signed)

## 2016-02-05 DIAGNOSIS — H5712 Ocular pain, left eye: Secondary | ICD-10-CM | POA: Diagnosis not present

## 2016-02-26 ENCOUNTER — Ambulatory Visit (INDEPENDENT_AMBULATORY_CARE_PROVIDER_SITE_OTHER): Payer: Medicare Other | Admitting: Family Medicine

## 2016-02-26 ENCOUNTER — Encounter: Payer: Self-pay | Admitting: Family Medicine

## 2016-02-26 VITALS — BP 130/70 | HR 74 | Temp 98.1°F | Resp 14 | Ht 60.0 in | Wt 103.8 lb

## 2016-02-26 DIAGNOSIS — R1030 Lower abdominal pain, unspecified: Secondary | ICD-10-CM | POA: Diagnosis not present

## 2016-02-26 DIAGNOSIS — R102 Pelvic and perineal pain: Secondary | ICD-10-CM | POA: Diagnosis not present

## 2016-02-26 LAB — POCT URINALYSIS DIPSTICK
Bilirubin, UA: NEGATIVE
Blood, UA: NEGATIVE
Glucose, UA: NEGATIVE
Ketones, UA: NEGATIVE
Leukocytes, UA: NEGATIVE
Nitrite, UA: NEGATIVE
Protein, UA: NEGATIVE
Spec Grav, UA: 1.025
Urobilinogen, UA: 0.2
pH, UA: 5.5

## 2016-02-26 NOTE — Patient Instructions (Signed)
We'll start with the pelvic ultrasound and then progress to further testing Please keep me posted of any changes in your health (worsening symptoms, new problems, ongoing weight loss, etc.) Let's get labs today

## 2016-02-26 NOTE — Progress Notes (Signed)
BP 130/70 mmHg  Pulse 74  Temp(Src) 98.1 F (36.7 C) (Oral)  Resp 14  Ht 5' (1.524 m)  Wt 103 lb 12.8 oz (47.083 kg)  BMI 20.27 kg/m2  SpO2 95%   Subjective:    Patient ID: [name], female    DOB: July 12, 1927, 82 y.o.   MRN: [id number]  HPI: [name] is a 82 y.o. female  Chief Complaint  Patient presents with  . Abdominal Pain    Patient states right side of lower abdomen radiating down to groin area. Onset aprox. 3-4 weeks ago. Worsens when sitting or lying down.   Patient known to me from my previous practice; has had issues with right sided pain for years; has seen gyn, gen surgery, and has had several imaging studies She has no nausea, eats well Fatigue is killing her in the middle of the day; gets a restful sleep; she is used to going 12 hours a day, and this is limiting her; can't do the things she used to do Little bit more weight loss No night sweats No hematuria or blood in stool No fevers No accidents or leakage Rightt lower quadrant, goes down the groin and leg, in the back when laying down; also down to 3rd and 4th toes; leg hasn't felt heavy; stance is not good in walking; no problems going up or down stairs; sometimes piercing pains in the legs She saw a gynecologist She saw Dr. Jamal Collin, he wanted to do a laparoscopy but patient was not interested She sees Dr. Nehemiah Massed; no changes in heart health ----------------------------- Saw Dr. Jacqulyn Liner, Assessment:  1. History of adenomatous polyps. Does not want colonoscopy at this time- agrees to do stool cards 2. Right sided flank/ruq pain- continue with MRI- I will look this up next week, states it is being done at [facility]. For now recommend Omeprazole '20mg'$  po qd due to history of gastritis, for a 6w course. She had questions about the PPI family, which we discussed and doesn't really want to do this- will look for a herbal alternative. Did discuss aloe with some cautions.Advised against vinegar with  rationale. Discussed dietary measures for GERD.  3. Intermittent dysphagia: Barium swallow for now. Plan:  1. Follow up 6-8w -----------------------------  Depression screen [facility] 2/9 02/26/2016 12/18/2015 08/15/2015  Decreased Interest 0 0 0  Down, Depressed, Hopeless 0 0 0  PHQ - 2 Score 0 0 0   Relevant past medical, surgical, family and social history reviewed and updated as indicated. Interim medical history since our last visit reviewed. Allergies and medications reviewed and updated.  Review of Systems Per HPI unless specifically indicated above     Objective:    BP 130/70 mmHg  Pulse 74  Temp(Src) 98.1 F (36.7 C) (Oral)  Resp 14  Ht 5' (1.524 m)  Wt 103 lb 12.8 oz (47.083 kg)  BMI 20.27 kg/m2  SpO2 95%  Wt Readings from Last 3 Encounters:  02/26/16 103 lb 12.8 oz (47.083 kg)  12/18/15 105 lb (47.628 kg)  08/15/15 105 lb (47.628 kg)    Physical Exam  Constitutional: She appears well-developed and well-nourished. No distress.  Elderly thin lady in no distress; two pounds of weigh tloss over last 6 months  Cardiovascular: Regular rhythm.   Pulmonary/Chest: Effort normal and breath sounds normal.  Abdominal: She exhibits mass (fullness in the RLQ). There is tenderness (mild tenderness to palpation RLQ). There is no guarding.  Genitourinary: No bleeding in the vagina. No vaginal discharge  found.  Atrophic vaginal tissues; fullness noted on bimanual exam right adnexa  Lymphadenopathy:       Right: No inguinal adenopathy present.       Left: No inguinal adenopathy present.  Skin: She is not diaphoretic. No pallor.  Psychiatric: Her mood appears not anxious. She does not exhibit a depressed mood.  Very pleasant    Results for orders placed or performed in visit on 02/26/16  POCT urinalysis dipstick  Result Value Ref Range   Color, UA light yellow    Clarity, UA clear    Glucose, UA neg    Bilirubin, UA neg    Ketones, UA neg    Spec Grav, UA 1.025    Blood, UA  neg    pH, UA 5.5    Protein, UA neg    Urobilinogen, UA 0.2    Nitrite, UA neg    Leukocytes, UA Negative Negative      Assessment & Plan:   Problem List Items Addressed This Visit      Other   Pelvic pain in female    Has been evaluated by GYN, surgery, has had numerous tests, but has persistent discomfort, symptoms; will order additional imaging and refer to gyn or surg as indicated      Relevant Orders   US Pelvis Complete (Completed)   US Transvaginal Non-OB (Completed)    Other Visit Diagnoses    Lower abdominal pain    -  Primary    urine, pelvic US ordered    Relevant Orders    POCT urinalysis dipstick (Completed)    US Pelvis Complete (Completed)    US Transvaginal Non-OB (Completed)       Follow up plan: No Follow-up on file.  An after-visit summary was printed and given to the patient at [place].  Please see the patient instructions which may contain other information and recommendations beyond what is mentioned above in the assessment and plan.  Orders Placed This Encounter  Procedures  . US Pelvis Complete  . US Transvaginal Non-OB  . POCT urinalysis dipstick

## 2016-02-29 DIAGNOSIS — R102 Pelvic and perineal pain: Secondary | ICD-10-CM | POA: Insufficient documentation

## 2016-03-08 ENCOUNTER — Telehealth: Payer: Self-pay | Admitting: Family Medicine

## 2016-03-08 ENCOUNTER — Ambulatory Visit
Admission: RE | Admit: 2016-03-08 | Discharge: 2016-03-08 | Disposition: A | Discharge status: Home / Self Care | Payer: Medicare Other | Source: Ambulatory Visit | Attending: Family Medicine | Admitting: Family Medicine

## 2016-03-08 DIAGNOSIS — R9389 Abnormal findings on diagnostic imaging of other specified body structures: Secondary | ICD-10-CM

## 2016-03-08 DIAGNOSIS — N83291 Other ovarian cyst, right side: Secondary | ICD-10-CM | POA: Insufficient documentation

## 2016-03-08 DIAGNOSIS — R938 Abnormal findings on diagnostic imaging of other specified body structures: Secondary | ICD-10-CM | POA: Insufficient documentation

## 2016-03-08 DIAGNOSIS — N83201 Unspecified ovarian cyst, right side: Secondary | ICD-10-CM | POA: Diagnosis not present

## 2016-03-08 DIAGNOSIS — R102 Pelvic and perineal pain: Secondary | ICD-10-CM | POA: Insufficient documentation

## 2016-03-08 DIAGNOSIS — R1030 Lower abdominal pain, unspecified: Secondary | ICD-10-CM | POA: Diagnosis not present

## 2016-03-08 NOTE — Telephone Encounter (Signed)
Refer back to GYN Call pt with results of [place]

## 2016-03-09 DIAGNOSIS — N83209 Unspecified ovarian cyst, unspecified side: Secondary | ICD-10-CM | POA: Insufficient documentation

## 2016-03-09 DIAGNOSIS — R9389 Abnormal findings on diagnostic imaging of other specified body structures: Secondary | ICD-10-CM | POA: Insufficient documentation

## 2016-03-09 NOTE — Telephone Encounter (Signed)
I talked with patient about results; she wishes to see GYN in [place]; she has name/number of someone in mind and can call us back with that info Referral entered

## 2016-03-20 NOTE — Assessment & Plan Note (Signed)
Has been evaluated by GYN, surgery, has had numerous tests, but has persistent discomfort, symptoms; will order additional imaging and refer to gyn or surg as indicated

## 2016-03-30 DIAGNOSIS — N83201 Unspecified ovarian cyst, right side: Secondary | ICD-10-CM | POA: Diagnosis not present

## 2016-03-30 DIAGNOSIS — N859 Noninflammatory disorder of uterus, unspecified: Secondary | ICD-10-CM | POA: Diagnosis not present

## 2016-03-30 DIAGNOSIS — N816 Rectocele: Secondary | ICD-10-CM | POA: Diagnosis not present

## 2016-03-30 DIAGNOSIS — N8111 Cystocele, midline: Secondary | ICD-10-CM | POA: Diagnosis not present

## 2016-04-08 ENCOUNTER — Telehealth: Payer: Self-pay | Admitting: Family Medicine

## 2016-04-08 DIAGNOSIS — R140 Abdominal distension (gaseous): Secondary | ICD-10-CM

## 2016-04-08 DIAGNOSIS — R1031 Right lower quadrant pain: Secondary | ICD-10-CM

## 2016-04-08 NOTE — Telephone Encounter (Signed)
PT ASKED THAT YOU GIVE HER A CALL ABOUT DR APPT SHE HAD WITH DR Aloha Gell. Wants to speak with you to colibrate.

## 2016-04-09 DIAGNOSIS — K219 Gastro-esophageal reflux disease without esophagitis: Secondary | ICD-10-CM | POA: Insufficient documentation

## 2016-04-09 DIAGNOSIS — R140 Abdominal distension (gaseous): Secondary | ICD-10-CM | POA: Insufficient documentation

## 2016-04-09 DIAGNOSIS — R109 Unspecified abdominal pain: Secondary | ICD-10-CM | POA: Insufficient documentation

## 2016-04-09 NOTE — Telephone Encounter (Signed)
I spoke with patient; she couldn't see her the first day, went back on Tuesday The gyn called radiologist; the scan was supposed to be 9 mm, not 9 cm The fluid in the uterus would probably absorb on its own She thinks the problem is GI and mentioned that four times She has seen Dr. Gustavo Lah in the past; she has to do something; that will be the next step Pain is not in the back; pressure, maybe gas

## 2016-04-26 DIAGNOSIS — R140 Abdominal distension (gaseous): Secondary | ICD-10-CM | POA: Diagnosis not present

## 2016-04-26 DIAGNOSIS — R1031 Right lower quadrant pain: Secondary | ICD-10-CM | POA: Diagnosis not present

## 2016-05-14 ENCOUNTER — Other Ambulatory Visit: Payer: Self-pay | Admitting: Gastroenterology

## 2016-05-14 DIAGNOSIS — R1031 Right lower quadrant pain: Secondary | ICD-10-CM

## 2016-05-27 ENCOUNTER — Ambulatory Visit: Admission: RE | Admit: 2016-05-27 | Payer: Medicare Other | Source: Ambulatory Visit

## 2016-06-04 ENCOUNTER — Ambulatory Visit
Admission: RE | Admit: 2016-06-04 | Discharge: 2016-06-04 | Disposition: A | Discharge status: Home / Self Care | Payer: Medicare Other | Source: Ambulatory Visit | Attending: Gastroenterology | Admitting: Gastroenterology

## 2016-06-04 DIAGNOSIS — R1031 Right lower quadrant pain: Secondary | ICD-10-CM | POA: Diagnosis not present

## 2016-06-04 DIAGNOSIS — I774 Celiac artery compression syndrome: Secondary | ICD-10-CM | POA: Insufficient documentation

## 2016-06-04 DIAGNOSIS — I700 Atherosclerosis of aorta: Secondary | ICD-10-CM | POA: Diagnosis not present

## 2016-06-04 DIAGNOSIS — K5730 Diverticulosis of large intestine without perforation or abscess without bleeding: Secondary | ICD-10-CM | POA: Diagnosis not present

## 2016-06-04 LAB — POCT I-STAT CREATININE: Creatinine, Ser: 0.7 mg/dL (ref 0.44–1.00)

## 2016-06-04 MED ORDER — IOPAMIDOL (ISOVUE-300) INJECTION 61%
80.0000 mL | Freq: Once | INTRAVENOUS | Status: AC | PRN
  Administered 2016-06-04: 80 mL via INTRAVENOUS

## 2016-06-24 DIAGNOSIS — I10 Essential (primary) hypertension: Secondary | ICD-10-CM | POA: Diagnosis not present

## 2016-06-24 DIAGNOSIS — E782 Mixed hyperlipidemia: Secondary | ICD-10-CM | POA: Diagnosis not present

## 2016-06-24 DIAGNOSIS — Z0000 Encounter for general adult medical examination without abnormal findings: Secondary | ICD-10-CM | POA: Diagnosis not present

## 2016-06-24 DIAGNOSIS — I2510 Atherosclerotic heart disease of native coronary artery without angina pectoris: Secondary | ICD-10-CM | POA: Diagnosis not present

## 2016-09-13 DIAGNOSIS — Z23 Encounter for immunization: Secondary | ICD-10-CM | POA: Diagnosis not present

## 2016-11-22 DIAGNOSIS — T1510XA Foreign body in conjunctival sac, unspecified eye, initial encounter: Secondary | ICD-10-CM | POA: Diagnosis not present

## 2016-11-30 ENCOUNTER — Encounter: Payer: Self-pay | Admitting: Family Medicine

## 2016-11-30 ENCOUNTER — Ambulatory Visit
Admission: RE | Admit: 2016-11-30 | Discharge: 2016-11-30 | Disposition: A | Discharge status: Home / Self Care | Payer: Medicare Other | Source: Ambulatory Visit | Attending: Family Medicine | Admitting: Family Medicine

## 2016-11-30 ENCOUNTER — Ambulatory Visit (INDEPENDENT_AMBULATORY_CARE_PROVIDER_SITE_OTHER): Payer: Medicare Other | Admitting: Family Medicine

## 2016-11-30 VITALS — BP 134/71 | HR 91 | Temp 98.2°F | Resp 16 | Ht 60.0 in | Wt 103.0 lb

## 2016-11-30 DIAGNOSIS — M47816 Spondylosis without myelopathy or radiculopathy, lumbar region: Secondary | ICD-10-CM | POA: Diagnosis not present

## 2016-11-30 DIAGNOSIS — M8588 Other specified disorders of bone density and structure, other site: Secondary | ICD-10-CM | POA: Diagnosis not present

## 2016-11-30 DIAGNOSIS — I700 Atherosclerosis of aorta: Secondary | ICD-10-CM | POA: Diagnosis not present

## 2016-11-30 DIAGNOSIS — M545 Low back pain, unspecified: Secondary | ICD-10-CM

## 2016-11-30 DIAGNOSIS — R350 Frequency of micturition: Secondary | ICD-10-CM

## 2016-11-30 LAB — POCT URINALYSIS DIPSTICK
Bilirubin, UA: NEGATIVE
Blood, UA: NEGATIVE
Glucose, UA: NEGATIVE
Ketones, UA: NEGATIVE
Leukocytes, UA: NEGATIVE
Nitrite, UA: NEGATIVE
Protein, UA: NEGATIVE
Spec Grav, UA: <=1.005
Urobilinogen, UA: 0.2
pH, UA: 5

## 2016-11-30 LAB — URINALYSIS, ROUTINE W REFLEX MICROSCOPIC
Bilirubin Urine: NEGATIVE
Glucose, UA: NEGATIVE
Hgb urine dipstick: NEGATIVE
Ketones, ur: NEGATIVE
Leukocytes, UA: NEGATIVE
Nitrite: NEGATIVE
Protein, ur: NEGATIVE
Specific Gravity, Urine: 1.008 (ref 1.001–1.035)
pH: 6 (ref 5.0–8.0)

## 2016-11-30 NOTE — Progress Notes (Signed)
Name: [name]   MRN: [id number]    DOB: 06-06-1927   Date:11/30/2016       Progress Note  Subjective  Chief Complaint  Chief Complaint  Patient presents with  . Acute Visit    possible bladder infection, pt complains of lower back pain and swollen abdomen, frequent urination x4 days    HPI  Pt. Presents for new onset low back pain x 3 days, abrupt onset, coupled with frequent urination, urgency, and unusual fatigue. These symptoms are new for her. She went to the chiropractor for her back but the manipulation did not relieve her low back pain. She has taken Ibuprofen for the pain and has rubbed Aspercreme at night which dulls the pain a little bit.  No fevers, chills, has been getting nauseated but no vomiting.   Past Medical History:  Diagnosis Date  . Arthritis   . Atherosclerosis   . Atherosclerosis of abdominal aorta ([place])   . Chronic kidney disease    stage 2  . CKD (chronic kidney disease) stage 2, GFR 60-89 ml/min   . Diverticulosis 2008  . Hyperlipidemia   . Lumbar spinal stenosis   . Osteoporosis   . Ovarian lump   . RA (rheumatoid arthritis) ([place])   . Rheumatoid arthritis ([place])   . Spinal stenosis     Past Surgical History:  Procedure Laterality Date  . CHOLECYSTECTOMY  2005   [place]  . COLONOSCOPY  2008   Dr. Donnella Sham  . EXCISION OF BREAST BIOPSY Left 1965   benign    Family History  Problem Relation Age of Onset  . Hypertension Father   . Cancer Brother     lung, bladder  . Alcohol abuse Brother   . Stroke Brother   . Heart disease Brother   . Hypertension Brother   . Diabetes Sister   . Alcohol abuse Sister   . Kidney disease Sister     Social History   Social History  . Marital status: Widowed    Spouse name: N/A  . Number of children: N/A  . Years of education: N/A   Occupational History  . Not on file.   Social History Main Topics  . Smoking status: Never Smoker  . Smokeless tobacco: Never Used  . Alcohol use No   . Drug use: No  . Sexual activity: Not on file   Other Topics Concern  . Not on file   Social History Narrative  . No narrative on file     Current Outpatient Prescriptions:  .  Ascorbic Acid (VITAMIN C CR) 500 MG CPCR, Take 500 mg by mouth daily., Disp: , Rfl:  .  aspirin 81 MG tablet, Take 81 mg by mouth daily as needed. Reported on 12/18/2015, Disp: , Rfl:  .  Brewers Yeast 487.5 MG TABS, Take by mouth daily., Disp: , Rfl:  .  Calcium Carbonate-Vitamin D (CALCIUM + D PO), Take 1 tablet by mouth daily. , Disp: , Rfl:  .  Cholecalciferol (VITAMIN D3) 3000 UNITS TABS, Take by mouth daily. , Disp: , Rfl:  .  Ginkgo Biloba (GINKOBA PO), Take 40 mg by mouth daily. , Disp: , Rfl:  .  Glucosamine Sulfate 500 MG CAPS, Take 500 mg by mouth daily., Disp: , Rfl:  .  ibuprofen (ADVIL,MOTRIN) 200 MG tablet, Take 200 mg by mouth every 6 (six) hours as needed., Disp: , Rfl:  .  Lactobacillus (PROBIOTIC ACIDOPHILUS PO), Take by mouth daily., Disp: , Rfl:  .  Multiple Vitamin (MULTIVITAMIN) capsule, Take 1 capsule by mouth daily., Disp: , Rfl:  .  Omega-3 Fatty Acids (OMEGA 3 PO), Take by mouth daily. , Disp: , Rfl:  .  Psyllium (METAMUCIL PO), Take by mouth daily. , Disp: , Rfl:  .  vitamin B-12 (CYANOCOBALAMIN) 1000 MCG tablet, Take 1,000 mcg by mouth daily., Disp: , Rfl:   Allergies  Allergen Reactions  . Ibandronic Acid Other (See Comments)    Achy all over. Flu like S/S  . Other Other (See Comments)    Achy all over. Flu like S/S Dysphagia  . Actonel [Risedronate] Other (See Comments)    Dysphagia Dysphagia dysphagia  . Raloxifene Other (See Comments)    Mood swings  . Doxycycline Rash  . Prednisone Rash     Review of Systems  Constitutional: Positive for malaise/fatigue. Negative for chills and fever.  Gastrointestinal: Positive for abdominal pain and nausea.  Genitourinary: Positive for frequency and urgency. Negative for dysuria, flank pain and hematuria.  Musculoskeletal:  Positive for back pain.      Objective  Vitals:   11/30/16 1011  BP: 134/71  Pulse: 91  Resp: 16  Temp: 98.2 F (36.8 C)  TempSrc: Oral  SpO2: 93%  Weight: 103 lb (46.7 kg)  Height: 5' (1.524 m)    Physical Exam  Constitutional: She is well-developed, well-nourished, and in no distress.  Cardiovascular: Normal rate, regular rhythm and normal heart sounds.   No murmur heard. Pulmonary/Chest: Effort normal and breath sounds normal. No respiratory distress. She has no wheezes.  Abdominal: Soft. Bowel sounds are normal. There is tenderness in the left lower quadrant. There is no CVA tenderness.  Musculoskeletal:       Lumbar back: She exhibits no tenderness, no pain and no spasm.  Nursing note and vitals reviewed.      Assessment & Plan  1. Frequent urination Point-of-care Urinalysis unremarkable, we will obtain microscopic exam and culture for further investigation. - POCT Urinalysis Dipstick - Urine Culture - Urinalysis, Routine w reflex microscopic  2. Acute bilateral low back pain without sciatica Appears to be associated w/ muscle strain, exclude underlying skeletal abnormality with obtaining an x-ray. - DG Lumbar Spine Complete; Future   Syed Asad [facility] 11/30/2016 10:20 AM

## 2016-12-01 LAB — URINE CULTURE: Organism ID, Bacteria: NO GROWTH

## 2016-12-02 ENCOUNTER — Telehealth: Payer: Self-pay | Admitting: Family Medicine

## 2016-12-02 MED ORDER — TRAMADOL HCL 50 MG PO TABS: 25.0000 mg | ORAL_TABLET | Freq: Three times a day (TID) | ORAL | 0 refills | Status: DC | PRN

## 2016-12-02 NOTE — Telephone Encounter (Signed)
I reviewed urine, lumbar spine xrays I spoke with CMA I called, left message for patient; I'll try her again soon

## 2016-12-02 NOTE — Telephone Encounter (Signed)
-----   Message from Darl Householder, [place] sent at 12/01/2016 11:23 AM EST ----- Patient has been notified of x-ray results and states she is still having pain in left hip and would like a call back from Dr. Sanda Klein, I checked here schedule and she has no availabilities, Dr. Sanda Klein will you please call patient

## 2016-12-02 NOTE — Telephone Encounter (Signed)
I spoke with patient She continues to be in pain; she says there is something not right on the right side of her abdomen; if she moves the wrong way; when she squats of bends, then when standing up, the joint catches; yesterday pain was terrible; today pain is not as bad; she has a copy of xray and went to see chiropractor; he got an xray that was made last year, tried to explain; arthritis is what's going on she says; disc too; compression deformity  I offered to treat her pain; she does not want a muscle relaxant; she has aleve and ibuprofen and tylenol; not aleve and ibuprofen, just one or the other  I offered and we'll try tramadol; let me know if not enough and we can step it up; confirmed [place]  I asked about night sweats or weight loss, anything worrisome like cancer; she says the toes on the right foot are just stinging and burning and aching, and will shake them; they have a funny feel; left foot okay; she was bitten by numerous ticks this past summer; she wonders about tick borne illness; just the area around the bite was red and swollen; on her back, so not sure about target lesions; we'll get labs then  Call me before appt in 2-3 weeks if needed Be cautious about tramadol, in case she's sensitive; don't drive if affects her

## 2016-12-03 ENCOUNTER — Telehealth: Payer: Self-pay

## 2016-12-03 NOTE — Telephone Encounter (Signed)
Patient is asking if you can give her a call when you get a chance. She has some concerns about her lower back pain. Home number [phone].[name] Thanks

## 2016-12-06 ENCOUNTER — Telehealth: Payer: Self-pay | Admitting: Family Medicine

## 2016-12-06 DIAGNOSIS — S32040A Wedge compression fracture of fourth lumbar vertebra, initial encounter for closed fracture: Secondary | ICD-10-CM

## 2016-12-06 DIAGNOSIS — I774 Celiac artery compression syndrome: Secondary | ICD-10-CM

## 2016-12-06 DIAGNOSIS — N83201 Unspecified ovarian cyst, right side: Secondary | ICD-10-CM

## 2016-12-06 DIAGNOSIS — K6389 Other specified diseases of intestine: Secondary | ICD-10-CM

## 2016-12-06 DIAGNOSIS — R1031 Right lower quadrant pain: Secondary | ICD-10-CM

## 2016-12-06 DIAGNOSIS — I771 Stricture of artery: Secondary | ICD-10-CM

## 2016-12-06 NOTE — Assessment & Plan Note (Signed)
Order MRI pelvis

## 2016-12-06 NOTE — Assessment & Plan Note (Addendum)
Order MRI; will also get MRA given the previous celiac artery stenosis finding on CT scan

## 2016-12-06 NOTE — Telephone Encounter (Signed)
I called and left detailed messages for patient on home and mobile I've not actually seen her since April She has seen Dr. Gustavo Lah; she had a CT scan with Ronney Asters, PA-C in July; she saw Dr. Nehemiah Massed in August, then saw my colleague here last week I very much want to help her with her symptoms; I'll be glad to see her here in the office tomorrow (Tuesday) at 11:20 or 11:40 and work through lunch I'm on-call tonight, so please contact me that way if needed If symptoms are serious, please go to the ER

## 2016-12-06 NOTE — Telephone Encounter (Signed)
PT SAID THAT SHE LEFT A MESSAGE THIS MORNING AND NEEDS SOMEONE TO CALL HER BY LUNCH FOR SHE THINKS THAT SHE HAS MASS ON HER ABD THAT SHE CAN FEEL AND IS CAUSING PROBLEMS AND THINKS THAT SHE NEEDS MRI. SAYS THAT THIS IS URGENT.

## 2016-12-06 NOTE — Telephone Encounter (Signed)
Orders entered for ASAP MRI and MRA

## 2016-12-06 NOTE — Telephone Encounter (Signed)
See other phone note

## 2016-12-08 ENCOUNTER — Telehealth: Payer: Self-pay

## 2016-12-08 ENCOUNTER — Ambulatory Visit (INDEPENDENT_AMBULATORY_CARE_PROVIDER_SITE_OTHER): Payer: Medicare Other | Admitting: Family Medicine

## 2016-12-08 ENCOUNTER — Encounter: Payer: Self-pay | Admitting: Family Medicine

## 2016-12-08 ENCOUNTER — Telehealth: Payer: Self-pay | Admitting: Family Medicine

## 2016-12-08 ENCOUNTER — Other Ambulatory Visit: Payer: Self-pay | Admitting: Family Medicine

## 2016-12-08 ENCOUNTER — Other Ambulatory Visit: Payer: Self-pay

## 2016-12-08 VITALS — BP 128/74 | HR 69 | Temp 97.7°F | Resp 16 | Wt 102.6 lb

## 2016-12-08 DIAGNOSIS — N83201 Unspecified ovarian cyst, right side: Secondary | ICD-10-CM | POA: Diagnosis not present

## 2016-12-08 DIAGNOSIS — I771 Stricture of artery: Secondary | ICD-10-CM

## 2016-12-08 DIAGNOSIS — I774 Celiac artery compression syndrome: Secondary | ICD-10-CM | POA: Diagnosis not present

## 2016-12-08 DIAGNOSIS — M533 Sacrococcygeal disorders, not elsewhere classified: Secondary | ICD-10-CM | POA: Diagnosis not present

## 2016-12-08 DIAGNOSIS — S32020S Wedge compression fracture of second lumbar vertebra, sequela: Secondary | ICD-10-CM

## 2016-12-08 DIAGNOSIS — R1031 Right lower quadrant pain: Secondary | ICD-10-CM | POA: Diagnosis not present

## 2016-12-08 DIAGNOSIS — I700 Atherosclerosis of aorta: Secondary | ICD-10-CM

## 2016-12-08 NOTE — Progress Notes (Signed)
BP 128/74   Pulse 69   Temp 97.7 F (36.5 C) (Oral)   Resp 16   Wt 102 lb 9 oz (46.5 kg)   SpO2 99%   BMI 20.03 kg/m    Subjective:    Patient ID: [name], female    DOB: 11/02/1927, 83 y.o.   MRN: [id number]  HPI: [name] is a 83 y.o. female  Chief Complaint  Patient presents with  . Belepharitis    stomach  . Back Pain    start out in back pain and now its going to her right side.    Patient is here for pain issues Was having pinpoint feelings on Monday; came to see Dr. Manuella Ghazi on Tuesday Taking advil or aleve to help control her pain Terrible pain when getting up to walk; has to push her body up to a certain position Had a high bed and has to use a stool to get up; gets stable before putting her feet on the floor Urinary frequency; up last night twice; some incontinence; gushes; no blood and no odor; daughter insists she drinks lots of water The whole right side of pelvis; hip too; the last three toes on the right foot would sting and burn and ache; the whole leg is involved now when she gets up with aching; just the toes stinging and burning; that just started  We reviewed her work-up since last April She was told about the blockage They recommended that she see a vascular doctor but she wanted to hold on that for then There is pressure after eating; pressure right up under the ribcage, going on for months if not years I prescribed tramadol but she did not fill it; she does not tolerate her brain being affected by medicine  CLINICAL DATA:  Pain across the low back radiating to the right hip for several days, no acute injury  EXAM: LUMBAR SPINE - COMPLETE 4+ VIEW  COMPARISON:  CT abdomen and pelvis of 06/04/2016  FINDINGS: The bones are diffusely osteopenic. and old partial compression deformity of L2 vertebral body is unchanged as is minimal compression of the superior endplate of Q67. Diffuse degenerative changes noted with degenerative  disc disease primarily at L2-3 and L5-S1 levels. Moderate abdominal aortic atherosclerotic change is present. The SI joints appear corticated. A small calcification in the mid pelvis is most consistent with a small calcified uterine fibroid.  IMPRESSION: 1. Diffuse osteopenia with degenerative change. 2. Old partial compression deformity of L2 and to a lesser degree the superior endplate of Y19. No acute abnormality. 3. Moderate abdominal aortic atherosclerosis.  Electronically Signed   By: Ivar Drape M.D.   On: 11/30/2016 11:30  No night sweats; a few chills; no weight loss  Depression screen [facility] 2/9 12/08/2016 11/30/2016 02/26/2016 12/18/2015 08/15/2015  Decreased Interest 0 0 0 0 0  Down, Depressed, Hopeless 0 0 0 0 0  PHQ - 2 Score 0 0 0 0 0   Relevant past medical, surgical, family and social history reviewed Past Medical History:  Diagnosis Date  . Arthritis   . Atherosclerosis   . Atherosclerosis of abdominal aorta ([place])   . Chronic kidney disease    stage 2  . CKD (chronic kidney disease) stage 2, GFR 60-89 ml/min   . Diverticulosis 2008  . Hyperlipidemia   . Lumbar spinal stenosis   . Osteoporosis   . Ovarian lump   . RA (rheumatoid arthritis) ([place])   . Rheumatoid arthritis ([place])   .  Spinal stenosis    Past Surgical History:  Procedure Laterality Date  . CHOLECYSTECTOMY  2005   [place]  . COLONOSCOPY  2008   Dr. Donnella Sham  . EXCISION OF BREAST BIOPSY Left 1965   benign   Family History  Problem Relation Age of Onset  . Hypertension Father   . Cancer Brother     lung, bladder  . Alcohol abuse Brother   . Stroke Brother   . Heart disease Brother   . Hypertension Brother   . Diabetes Sister   . Alcohol abuse Sister   . Kidney disease Sister    Social History  Substance Use Topics  . Smoking status: Never Smoker  . Smokeless tobacco: Never Used  . Alcohol use No   Interim medical history since last visit reviewed. Allergies and medications  reviewed  Review of Systems Per HPI unless specifically indicated above     Objective:    BP 128/74   Pulse 69   Temp 97.7 F (36.5 C) (Oral)   Resp 16   Wt 102 lb 9 oz (46.5 kg)   SpO2 99%   BMI 20.03 kg/m   Wt Readings from Last 3 Encounters:  12/08/16 102 lb 9 oz (46.5 kg)  11/30/16 103 lb (46.7 kg)  02/26/16 103 lb 12.8 oz (47.1 kg)    Physical Exam  Constitutional: She appears well-developed and well-nourished. No distress.  Elderly thin lady in no distress; weight stable  Cardiovascular: Normal rate and regular rhythm.   Pulmonary/Chest: Effort normal and breath sounds normal.  Abdominal: Soft. Bowel sounds are normal. She exhibits mass (fullness in the RLQ). She exhibits no abdominal bruit. There is tenderness (mild tenderness to palpation RLQ). There is no guarding.  Genitourinary:  Genitourinary Comments: fullness noted right side pelvis  Musculoskeletal:       Back:  Skin: No rash noted. She is not diaphoretic. No pallor.  Psychiatric: Her mood appears not anxious. Cognition and memory are not impaired. She does not exhibit a depressed mood. She exhibits normal recent memory and normal remote memory.  Very pleasant    Results for orders placed or performed in visit on 11/30/16  Urine Culture  Result Value Ref Range   Organism ID, Bacteria NO GROWTH   Urinalysis, Routine w reflex microscopic  Result Value Ref Range   Color, Urine YELLOW YELLOW   APPearance CLEAR CLEAR   Specific Gravity, Urine 1.008 1.001 - 1.035   pH 6.0 5.0 - 8.0   Glucose, UA NEGATIVE NEGATIVE   Bilirubin Urine NEGATIVE NEGATIVE   Ketones, ur NEGATIVE NEGATIVE   Hgb urine dipstick NEGATIVE NEGATIVE   Protein, ur NEGATIVE NEGATIVE   Nitrite NEGATIVE NEGATIVE   Leukocytes, UA NEGATIVE NEGATIVE  POCT Urinalysis Dipstick  Result Value Ref Range   Color, UA     Clarity, UA     Glucose, UA NEG    Bilirubin, UA NEG    Ketones, UA NEG    Spec Grav, UA <=1.005    Blood, UA NEG    pH,  UA 5.0    Protein, UA NEG    Urobilinogen, UA 0.2    Nitrite, UA NEG    Leukocytes, UA Negative Negative      Assessment & Plan:   Problem List Items Addressed This Visit      Cardiovascular and Mediastinum   Celiac artery stenosis (HCC)    Discussed findings on CT scan; risks of aspirin and statin      Relevant  Orders   MR Abdomen W Wo Contrast   BUN   Creatinine   Atherosclerosis of abdominal aorta (H[facility])    Reviewed last lipids, I cannot see starting a statin right now; will send message to vascular surgeon and see if stenting is an option      Relevant Orders   MR Abdomen W Wo Contrast   BUN   Creatinine     Musculoskeletal and Integument   Compression fracture of L2 lumbar vertebra ([place])    Patient knows about dx; cautioned about falls; we discussed her bed, she may use another bedroom set        Genitourinary   Ovarian cyst    Large right ovarian cyst; re-image; has seen GYN        Other   Abdominal pain    Ordering scans; discussed ddx with her      Relevant Orders   MR Abdomen W Wo Contrast   BUN   Creatinine    Other Visit Diagnoses    Sacral back pain    -  Primary   ddx includes pelvic insufficiency fracture, malignancy, radicular pain; discussed imaging; she has MRI tomorrow; she does not want tramadol or other pain med       Follow up plan: No Follow-up on file.  An after-visit summary was printed and given to the patient at [place].  Please see the patient instructions which may contain other information and recommendations beyond what is mentioned above in the assessment and plan.  Meds ordered this encounter  Medications  . CINNAMON PO    Sig: Take by mouth.    Orders Placed This Encounter  Procedures  . MR Abdomen W Wo Contrast  . BUN  . Creatinine   Face-to-face time with patient was more than 25 minutes, >50% time spent counseling and coordination of care

## 2016-12-08 NOTE — Assessment & Plan Note (Signed)
Ordering scans; discussed ddx with her

## 2016-12-08 NOTE — Telephone Encounter (Signed)
I was expecting to see patient in the office yesterday Please contact her and get her on the schedule I've not seen her since April and want to help her with her complaints I'm completely booked this morning, but could see her at 11:40 am tomorrow

## 2016-12-08 NOTE — Assessment & Plan Note (Signed)
Discussed findings on CT scan; risks of aspirin and statin

## 2016-12-08 NOTE — Assessment & Plan Note (Signed)
Reviewed last lipids, I cannot see starting a statin right now; will send message to vascular surgeon and see if stenting is an option

## 2016-12-08 NOTE — Patient Instructions (Addendum)
Please have the imaging done tomorrow Please let me know how I can help

## 2016-12-08 NOTE — Assessment & Plan Note (Signed)
Patient knows about dx; cautioned about falls; we discussed her bed, she may use another bedroom set

## 2016-12-08 NOTE — Assessment & Plan Note (Signed)
Large right ovarian cyst; re-image; has seen GYN

## 2016-12-08 NOTE — Telephone Encounter (Signed)
-----   Message from Laurita Quint sent at 12/08/2016  1:17 PM EST ----- Regarding: Order Needed Contact: [phone] DTOIZ,  Thank you for signing the corrected orders.  The technologist is stating per Protocol, an order for MR Abdomen W/Wo (IMG 321) is also needed.  Thank you so much for your help.

## 2016-12-08 NOTE — Telephone Encounter (Signed)
Please enter and I'll be glad to co-sign

## 2016-12-08 NOTE — Telephone Encounter (Signed)
Will put in today's note

## 2016-12-08 NOTE — Telephone Encounter (Signed)
[facility] MRI department called and stated the orders for her where enter wrong. They need MRA Abdomen with/wo contrast, Pelvic With/ WO contrast and MRI abdomen With/WO contrast.

## 2016-12-09 ENCOUNTER — Ambulatory Visit (HOSPITAL_COMMUNITY)
Admission: RE | Admit: 2016-12-09 | Discharge: 2016-12-09 | Disposition: A | Discharge status: Home / Self Care | Payer: Medicare Other | Source: Ambulatory Visit | Attending: Family Medicine | Admitting: Family Medicine

## 2016-12-09 ENCOUNTER — Ambulatory Visit (HOSPITAL_COMMUNITY): Admission: RE | Admit: 2016-12-09 | Payer: Medicare Other | Source: Ambulatory Visit

## 2016-12-09 DIAGNOSIS — N83201 Unspecified ovarian cyst, right side: Secondary | ICD-10-CM

## 2016-12-09 DIAGNOSIS — N83291 Other ovarian cyst, right side: Secondary | ICD-10-CM | POA: Diagnosis not present

## 2016-12-09 DIAGNOSIS — K639 Disease of intestine, unspecified: Secondary | ICD-10-CM | POA: Insufficient documentation

## 2016-12-09 DIAGNOSIS — R1031 Right lower quadrant pain: Secondary | ICD-10-CM | POA: Insufficient documentation

## 2016-12-09 DIAGNOSIS — D259 Leiomyoma of uterus, unspecified: Secondary | ICD-10-CM | POA: Diagnosis not present

## 2016-12-09 DIAGNOSIS — I708 Atherosclerosis of other arteries: Secondary | ICD-10-CM | POA: Diagnosis not present

## 2016-12-09 DIAGNOSIS — M4856XA Collapsed vertebra, not elsewhere classified, lumbar region, initial encounter for fracture: Secondary | ICD-10-CM | POA: Insufficient documentation

## 2016-12-09 DIAGNOSIS — I774 Celiac artery compression syndrome: Secondary | ICD-10-CM | POA: Insufficient documentation

## 2016-12-09 DIAGNOSIS — M5136 Other intervertebral disc degeneration, lumbar region: Secondary | ICD-10-CM | POA: Insufficient documentation

## 2016-12-09 DIAGNOSIS — K5730 Diverticulosis of large intestine without perforation or abscess without bleeding: Secondary | ICD-10-CM | POA: Insufficient documentation

## 2016-12-09 DIAGNOSIS — I700 Atherosclerosis of aorta: Secondary | ICD-10-CM | POA: Insufficient documentation

## 2016-12-09 DIAGNOSIS — R109 Unspecified abdominal pain: Secondary | ICD-10-CM | POA: Diagnosis not present

## 2016-12-09 LAB — POCT I-STAT CREATININE: Creatinine, Ser: 0.5 mg/dL (ref 0.44–1.00)

## 2016-12-09 MED ORDER — GADOBENATE DIMEGLUMINE 529 MG/ML IV SOLN
9.0000 mL | Freq: Once | INTRAVENOUS | Status: AC | PRN
  Administered 2016-12-09: 9 mL via INTRAVENOUS

## 2016-12-10 DIAGNOSIS — K6389 Other specified diseases of intestine: Secondary | ICD-10-CM | POA: Insufficient documentation

## 2016-12-10 DIAGNOSIS — S32040A Wedge compression fracture of fourth lumbar vertebra, initial encounter for closed fracture: Secondary | ICD-10-CM

## 2016-12-10 HISTORY — DX: Wedge compression fracture of fourth lumbar vertebra, initial encounter for closed fracture: S32.040A

## 2016-12-10 MED ORDER — CALCITONIN (SALMON) 200 UNIT/ACT NA SOLN: 1.0000 | Freq: Every day | NASAL | 3 refills | Status: DC

## 2016-12-10 MED ORDER — LIDOCAINE 5 % EX PTCH: 1.0000 | MEDICATED_PATCH | CUTANEOUS | 0 refills | Status: DC

## 2016-12-10 NOTE — Assessment & Plan Note (Addendum)
CT scan abd and pelvis w/contrast; discussed results over the phone with patient and two daughters; not present in CT scan July 2017

## 2016-12-10 NOTE — Assessment & Plan Note (Signed)
Start miacalcin; offer kyphoplasty

## 2016-12-10 NOTE — Telephone Encounter (Signed)
I called home number and left brief msg; I called mobile number and left detailed msg; have several things to discuss, but I want her to know now that she has a new compression fracture; consider procedure called kyphoplasty; also, new medicine called miacalcin prescribed to help with osteoporosis and bone pain associated with fractures; also, lidocaine patch for pain as well; if pain worsens over weekend, go to ER, I believe she can be admitted for pain control, procedure I have more to discuss (I did not leave mention of the cecal mass on voicemail; will discuss with her once I reach her)

## 2016-12-11 NOTE — Telephone Encounter (Signed)
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

## 2016-12-11 NOTE — Assessment & Plan Note (Signed)
Now "mild" on MRA Feb 2018

## 2016-12-11 NOTE — Telephone Encounter (Signed)
[name], Please see new orders for CT scan abd/pelvis; I'd like to have this done Monday afternoon or Tuesday; patient will have breakfast Monday but then not eat again until she hears from you so she can be NPO for 4 hours if scan scheduled in the afternoon on Monday If scan is going to be Tuesday, call her so she knows okay to eat I also put in referral for kyphoplasty; if not done at [facility], okay to do at [facility] Thank you so much

## 2016-12-13 ENCOUNTER — Telehealth: Payer: Self-pay

## 2016-12-13 NOTE — Telephone Encounter (Signed)
Patient stated she received a call from you regarding her MRI. When reading the message she left she sound like she is in a lot of pain and is frighten. Patient would like a call back when you get a chance.

## 2016-12-13 NOTE — Telephone Encounter (Signed)
Thank you :)

## 2016-12-13 NOTE — Telephone Encounter (Signed)
I called the patient This message she left was between my first call to her and Saturday's call with her, so we've addressed her concerns Resting a lot now; nauseated yesterday Has her CT scan tomorrow and kyphoplasty consult Wednesday

## 2016-12-13 NOTE — Telephone Encounter (Signed)
Patient was informed that she has been scheduled to see Dr. Hessie Knows with [facility] on this Wednesday, December 15, 2016 at 2:30p regarding the Kyphoplasty.

## 2016-12-13 NOTE — Telephone Encounter (Signed)
Please check on interventional radiology referral for kyphoplasty; this is the first one I've put in EPIC/CHL so not sure where this goes or the process, but don't want it dropped; thank you

## 2016-12-14 ENCOUNTER — Ambulatory Visit
Admission: RE | Admit: 2016-12-14 | Discharge: 2016-12-14 | Disposition: A | Discharge status: Home / Self Care | Payer: Medicare Other | Source: Ambulatory Visit | Attending: Family Medicine | Admitting: Family Medicine

## 2016-12-14 DIAGNOSIS — K639 Disease of intestine, unspecified: Secondary | ICD-10-CM | POA: Diagnosis present

## 2016-12-14 DIAGNOSIS — R1011 Right upper quadrant pain: Secondary | ICD-10-CM | POA: Diagnosis not present

## 2016-12-14 DIAGNOSIS — I700 Atherosclerosis of aorta: Secondary | ICD-10-CM | POA: Insufficient documentation

## 2016-12-14 DIAGNOSIS — K6389 Other specified diseases of intestine: Secondary | ICD-10-CM

## 2016-12-14 DIAGNOSIS — K5730 Diverticulosis of large intestine without perforation or abscess without bleeding: Secondary | ICD-10-CM | POA: Diagnosis not present

## 2016-12-14 HISTORY — DX: Systemic involvement of connective tissue, unspecified: M35.9

## 2016-12-14 MED ORDER — IOPAMIDOL (ISOVUE-300) INJECTION 61%
100.0000 mL | Freq: Once | INTRAVENOUS | Status: AC | PRN
  Administered 2016-12-14: 85 mL via INTRAVENOUS

## 2016-12-15 ENCOUNTER — Telehealth: Payer: Self-pay

## 2016-12-15 DIAGNOSIS — S32040A Wedge compression fracture of fourth lumbar vertebra, initial encounter for closed fracture: Secondary | ICD-10-CM | POA: Diagnosis not present

## 2016-12-15 NOTE — Telephone Encounter (Signed)
I contacted this patient to inform her that the rx for the Lidoderm 5% patches requires a PA and that there was a good chance of it being denied since her dx did not meet criteria and to see if she would be ok with just buying 3 patches from over the counter. She stated ok and that it would be fine.  She went on to say that she apologizes for last night, some how her phone disconnected and she could not reconnect the line. She stated that she wanted Dr. Sanda Klein to know that she really appreciates all that she has done and all that she is doing.   I told her that I would let her know

## 2016-12-15 NOTE — Telephone Encounter (Signed)
Thank you :)

## 2016-12-16 ENCOUNTER — Encounter
Admission: RE | Admit: 2016-12-16 | Discharge: 2016-12-16 | Disposition: A | Discharge status: Home / Self Care | Payer: Medicare Other | Source: Ambulatory Visit | Attending: Orthopedic Surgery | Admitting: Orthopedic Surgery

## 2016-12-16 ENCOUNTER — Telehealth: Payer: Self-pay | Admitting: Family Medicine

## 2016-12-16 DIAGNOSIS — M19041 Primary osteoarthritis, right hand: Secondary | ICD-10-CM | POA: Diagnosis not present

## 2016-12-16 DIAGNOSIS — M19042 Primary osteoarthritis, left hand: Secondary | ICD-10-CM | POA: Diagnosis not present

## 2016-12-16 DIAGNOSIS — E785 Hyperlipidemia, unspecified: Secondary | ICD-10-CM | POA: Diagnosis not present

## 2016-12-16 DIAGNOSIS — K219 Gastro-esophageal reflux disease without esophagitis: Secondary | ICD-10-CM | POA: Diagnosis not present

## 2016-12-16 DIAGNOSIS — M1990 Unspecified osteoarthritis, unspecified site: Secondary | ICD-10-CM | POA: Diagnosis not present

## 2016-12-16 DIAGNOSIS — M810 Age-related osteoporosis without current pathological fracture: Secondary | ICD-10-CM | POA: Diagnosis not present

## 2016-12-16 DIAGNOSIS — S32040A Wedge compression fracture of fourth lumbar vertebra, initial encounter for closed fracture: Secondary | ICD-10-CM | POA: Diagnosis present

## 2016-12-16 DIAGNOSIS — Z01818 Encounter for other preprocedural examination: Secondary | ICD-10-CM | POA: Diagnosis not present

## 2016-12-16 DIAGNOSIS — M4856XA Collapsed vertebra, not elsewhere classified, lumbar region, initial encounter for fracture: Secondary | ICD-10-CM | POA: Diagnosis not present

## 2016-12-16 HISTORY — DX: Raynaud's syndrome without gangrene: I73.00

## 2016-12-16 HISTORY — DX: Cardiac murmur, unspecified: R01.1

## 2016-12-16 HISTORY — DX: Gastro-esophageal reflux disease without esophagitis: K21.9

## 2016-12-16 LAB — SURGICAL PCR SCREEN
MRSA, PCR: NEGATIVE
Staphylococcus aureus: NEGATIVE

## 2016-12-16 LAB — CBC
HCT: 40.7 % (ref 35.0–47.0)
Hemoglobin: 13.5 g/dL (ref 12.0–16.0)
MCH: 29.3 pg (ref 26.0–34.0)
MCHC: 33.1 g/dL (ref 32.0–36.0)
MCV: 88.6 fL (ref 80.0–100.0)
Platelets: 226 10*3/uL (ref 150–440)
RBC: 4.6 MIL/uL (ref 3.80–5.20)
RDW: 12.7 % (ref 11.5–14.5)
WBC: 5.2 10*3/uL (ref 3.6–11.0)

## 2016-12-16 MED ORDER — CEFAZOLIN IN D5W 1 GM/50ML IV SOLN
1.0000 g | Freq: Once | INTRAVENOUS | Status: AC
  Administered 2016-12-17: 1 g via INTRAVENOUS

## 2016-12-16 NOTE — Telephone Encounter (Signed)
Just an FYI: Was referred to Francella Solian from [facility]. Tomorrow he will perform kyphoplasty around noon. She would like to thank you

## 2016-12-16 NOTE — Telephone Encounter (Signed)
Thank you so much for the update; she'll be in my thoughts

## 2016-12-16 NOTE — Patient Instructions (Signed)
  Your procedure is scheduled on: December 17, 2016 (Friday) Report to Same Day Surgery 2nd floor medical mall [facility] Entrance-take elevator on left to 2nd floor.  Check in with surgery information desk.) To find out your arrival time please call [phone] between 1PM - 3PM on December 16, 2016 (Today)  Remember: Instructions that are not followed completely may result in serious medical risk, up to and including death, or upon the discretion of your surgeon and anesthesiologist your surgery may need to be rescheduled.    _x___ 1. Do not eat food or drink liquids after midnight. No gum chewing or hard candies.     __x__ 2. No Alcohol for 24 hours before or after surgery.   __x__3. No Smoking for 24 prior to surgery.   ____  4. Bring all medications with you on the day of surgery if instructed.    __x__ 5. Notify your doctor if there is any change in your medical condition     (cold, fever, infections).     Do not wear jewelry, make-up, hairpins, clips or nail polish.  Do not wear lotions, powders, or perfumes. You may wear deodorant.  Do not shave 48 hours prior to surgery. Men may shave face and neck.  Do not bring valuables to the hospital.    [facility] is not responsible for any belongings or valuables.               Contacts, dentures or bridgework may not be worn into surgery.  Leave your suitcase in the car. After surgery it may be brought to your room.  For patients admitted to the hospital, discharge time is determined by your treatment team.   Patients discharged the day of surgery will not be allowed to drive home.  You will need someone to drive you home and stay with you the night of your procedure.    Please read over the following fact sheets that you were given:   [facility] Preparing for Surgery and or MRSA Information   ___ Take these medicines the morning of surgery with A SIP OF WATER:    1.   2.  3.  4.  5.  6.  ____Fleets enema or Magnesium  Citrate as directed.   _x___ Use CHG Soap or sage wipes as directed on instruction sheet   ____ Use inhalers on the day of surgery and bring to hospital day of surgery  ____ Stop metformin 2 days prior to surgery    ____ Take 1/2 of usual insulin dose the night before surgery and none on the morning of           surgery.   _x___ Stop Aspirin, Coumadin, Pllavix ,Eliquis, Effient, or Pradaxa (NO ASPIRIN)  x__ Stop Anti-inflammatories such as Advil, Aleve, Ibuprofen, Motrin, Naproxen,          Naprosyn, Goodies powders or aspirin products. Ok to take Tylenol.   __x__ Stop supplements until after surgery.    (STOP ALL SUPPLEMENTS)  ____ Bring C-Pap to the hospital.

## 2016-12-16 NOTE — Pre-Procedure Instructions (Signed)
Component Name Value Ref Range  Vent Rate (bpm) 61   PR Interval (msec) 150   QRS Interval (msec) 86   QT Interval (msec) 404   QTc (msec) 406   Result Narrative  Normal sinus rhythm Possible Left atrial enlargement Borderline ECG No previous ECGs available I reviewed and concur with this report. Electronically signed [id number] MD, Darnell Level [phone]) on 07/06/2016 5:20:21 PM  Status

## 2016-12-17 ENCOUNTER — Ambulatory Visit: Payer: Medicare Other | Admitting: Anesthesiology

## 2016-12-17 ENCOUNTER — Ambulatory Visit: Payer: Medicare Other

## 2016-12-17 ENCOUNTER — Other Ambulatory Visit: Payer: Medicare Other

## 2016-12-17 ENCOUNTER — Ambulatory Visit
Admission: RE | Admit: 2016-12-17 | Discharge: 2016-12-17 | Disposition: A | Discharge status: Home / Self Care | Payer: Medicare Other | Source: Ambulatory Visit | Attending: Orthopedic Surgery | Admitting: Orthopedic Surgery

## 2016-12-17 ENCOUNTER — Encounter
Admission: RE | Disposition: A | Discharge status: Home / Self Care | Payer: Self-pay | Source: Ambulatory Visit | Attending: Orthopedic Surgery

## 2016-12-17 ENCOUNTER — Encounter: Payer: Self-pay | Admitting: *Deleted

## 2016-12-17 DIAGNOSIS — M810 Age-related osteoporosis without current pathological fracture: Secondary | ICD-10-CM | POA: Insufficient documentation

## 2016-12-17 DIAGNOSIS — Z0389 Encounter for observation for other suspected diseases and conditions ruled out: Secondary | ICD-10-CM | POA: Diagnosis not present

## 2016-12-17 DIAGNOSIS — M4856XA Collapsed vertebra, not elsewhere classified, lumbar region, initial encounter for fracture: Secondary | ICD-10-CM | POA: Insufficient documentation

## 2016-12-17 DIAGNOSIS — M1990 Unspecified osteoarthritis, unspecified site: Secondary | ICD-10-CM | POA: Diagnosis not present

## 2016-12-17 DIAGNOSIS — E785 Hyperlipidemia, unspecified: Secondary | ICD-10-CM | POA: Insufficient documentation

## 2016-12-17 DIAGNOSIS — Z01818 Encounter for other preprocedural examination: Secondary | ICD-10-CM | POA: Diagnosis not present

## 2016-12-17 DIAGNOSIS — I739 Peripheral vascular disease, unspecified: Secondary | ICD-10-CM | POA: Diagnosis not present

## 2016-12-17 DIAGNOSIS — K219 Gastro-esophageal reflux disease without esophagitis: Secondary | ICD-10-CM | POA: Insufficient documentation

## 2016-12-17 DIAGNOSIS — M19041 Primary osteoarthritis, right hand: Secondary | ICD-10-CM | POA: Insufficient documentation

## 2016-12-17 DIAGNOSIS — Z419 Encounter for procedure for purposes other than remedying health state, unspecified: Secondary | ICD-10-CM

## 2016-12-17 DIAGNOSIS — M19042 Primary osteoarthritis, left hand: Secondary | ICD-10-CM | POA: Insufficient documentation

## 2016-12-17 DIAGNOSIS — S32040A Wedge compression fracture of fourth lumbar vertebra, initial encounter for closed fracture: Secondary | ICD-10-CM | POA: Diagnosis not present

## 2016-12-17 HISTORY — PX: KYPHOPLASTY: SHX5884

## 2016-12-17 SURGERY — KYPHOPLASTY
Anesthesia: General | Site: Back | Wound class: Clean

## 2016-12-17 MED ORDER — LIDOCAINE HCL 1 % IJ SOLN
INTRAMUSCULAR | Status: DC | PRN
  Administered 2016-12-17: 20 mL

## 2016-12-17 MED ORDER — LACTATED RINGERS IV SOLN
INTRAVENOUS | Status: DC
  Administered 2016-12-17: 11:00:00 via INTRAVENOUS

## 2016-12-17 MED ORDER — METOCLOPRAMIDE HCL 10 MG PO TABS: 5.0000 mg | ORAL_TABLET | Freq: Three times a day (TID) | ORAL | Status: DC | PRN

## 2016-12-17 MED ORDER — IOPAMIDOL (ISOVUE-M 200) INJECTION 41%
INTRAMUSCULAR | Status: AC
  Filled 2016-12-17: qty 20

## 2016-12-17 MED ORDER — SODIUM CHLORIDE 0.9 % IV SOLN: INTRAVENOUS | Status: DC

## 2016-12-17 MED ORDER — CEFAZOLIN IN D5W 1 GM/50ML IV SOLN
INTRAVENOUS | Status: AC
  Filled 2016-12-17: qty 50

## 2016-12-17 MED ORDER — BUPIVACAINE-EPINEPHRINE (PF) 0.5% -1:200000 IJ SOLN
INTRAMUSCULAR | Status: AC
  Filled 2016-12-17: qty 30

## 2016-12-17 MED ORDER — LIDOCAINE HCL (PF) 1 % IJ SOLN
INTRAMUSCULAR | Status: AC
  Filled 2016-12-17: qty 30

## 2016-12-17 MED ORDER — OXYCODONE HCL 5 MG/5ML PO SOLN: 5.0000 mg | Freq: Once | ORAL | Status: DC | PRN

## 2016-12-17 MED ORDER — OXYCODONE HCL 5 MG PO TABS: 5.0000 mg | ORAL_TABLET | Freq: Once | ORAL | Status: DC | PRN

## 2016-12-17 MED ORDER — FENTANYL CITRATE (PF) 100 MCG/2ML IJ SOLN: 25.0000 ug | INTRAMUSCULAR | Status: DC | PRN

## 2016-12-17 MED ORDER — BUPIVACAINE-EPINEPHRINE (PF) 0.5% -1:200000 IJ SOLN
INTRAMUSCULAR | Status: DC | PRN
  Administered 2016-12-17: 10 mL

## 2016-12-17 MED ORDER — FAMOTIDINE 20 MG PO TABS
ORAL_TABLET | ORAL | Status: AC
  Filled 2016-12-17: qty 1

## 2016-12-17 MED ORDER — PROPOFOL 10 MG/ML IV BOLUS
INTRAVENOUS | Status: AC
  Filled 2016-12-17: qty 40

## 2016-12-17 MED ORDER — PROPOFOL 500 MG/50ML IV EMUL
INTRAVENOUS | Status: DC | PRN
  Administered 2016-12-17: 25 ug/kg/min via INTRAVENOUS

## 2016-12-17 MED ORDER — FAMOTIDINE 20 MG PO TABS
20.0000 mg | ORAL_TABLET | Freq: Once | ORAL | Status: AC
  Administered 2016-12-17: 20 mg via ORAL

## 2016-12-17 MED ORDER — METOCLOPRAMIDE HCL 5 MG/ML IJ SOLN: 5.0000 mg | Freq: Three times a day (TID) | INTRAMUSCULAR | Status: DC | PRN

## 2016-12-17 MED ORDER — HYDROCODONE-ACETAMINOPHEN 5-325 MG PO TABS: 1.0000 | ORAL_TABLET | ORAL | Status: DC | PRN

## 2016-12-17 MED ORDER — ONDANSETRON HCL 4 MG/2ML IJ SOLN: 4.0000 mg | Freq: Four times a day (QID) | INTRAMUSCULAR | Status: DC | PRN

## 2016-12-17 MED ORDER — ONDANSETRON HCL 4 MG PO TABS: 4.0000 mg | ORAL_TABLET | Freq: Four times a day (QID) | ORAL | Status: DC | PRN

## 2016-12-17 SURGICAL SUPPLY — 15 items
CEMENT KYPHON CX01A KIT/MIXER (Cement) ×2 IMPLANT
DEVICE BIOPSY BONE KYPHX (INSTRUMENTS) ×2 IMPLANT
DRAPE C-ARM XRAY 36X54 (DRAPES) ×2 IMPLANT
DURAPREP 26ML APPLICATOR (WOUND CARE) ×2 IMPLANT
GLOVE SURG SYN 9.0  PF PI (GLOVE) ×1
GLOVE SURG SYN 9.0 PF PI (GLOVE) ×1 IMPLANT
GOWN SRG 2XL LVL 4 RGLN SLV (GOWNS) ×1 IMPLANT
GOWN STRL NON-REIN 2XL LVL4 (GOWNS) ×1
GOWN STRL REUS W/ TWL LRG LVL3 (GOWN DISPOSABLE) ×1 IMPLANT
GOWN STRL REUS W/TWL LRG LVL3 (GOWN DISPOSABLE) ×1
LIQUID BAND (GAUZE/BANDAGES/DRESSINGS) ×2 IMPLANT
PACK KYPHOPLASTY (MISCELLANEOUS) ×2 IMPLANT
STRAP SAFETY BODY (MISCELLANEOUS) ×2 IMPLANT
TRAY KYPHOPAK 15/3 EXPRESS 1ST (MISCELLANEOUS) ×2 IMPLANT
TRAY KYPHOPAK 20/3 EXPRESS 1ST (MISCELLANEOUS) IMPLANT

## 2016-12-17 NOTE — H&P (Signed)
Reviewed paper H+P, will be scanned into chart. No changes noted.  

## 2016-12-17 NOTE — Discharge Instructions (Signed)
Remove Band-Aid on Sunday. Take it easy today and do normal activities starting tomorrow as tolerated. Call if pain gets markedly worse. Okay to shower after Band-Aid removal   AMBULATORY SURGERY  DISCHARGE INSTRUCTIONS   1) The drugs that you were given will stay in your system until tomorrow so for the next 24 hours you should not:  A) Drive an automobile B) Make any legal decisions C) Drink any alcoholic beverage   2) You may resume regular meals tomorrow.  Today it is better to start with liquids and gradually work up to solid foods.  You may eat anything you prefer, but it is better to start with liquids, then soup and crackers, and gradually work up to solid foods.   3) Please notify your doctor immediately if you have any unusual bleeding, trouble breathing, redness and pain at the surgery site, drainage, fever, or pain not relieved by medication.    4) Additional Instructions:        Please contact your physician with any problems or Same Day Surgery at [phone], Monday through Friday 6 am to 4 pm, or [place] at [facility] number at [phone].

## 2016-12-17 NOTE — Op Note (Signed)
12/17/2016  1:00 PM  PATIENT:  [name]  83 y.o. female  PRE-OPERATIVE DIAGNOSIS:  compression fracture L4  POST-OPERATIVE DIAGNOSIS:  compression fracture L4  PROCEDURE:  Procedure(s) with comments: KYPHOPLASTY (N/A) - l4  SURGEON: Laurene Footman, MD  ASSISTANTS: None  ANESTHESIA:   local and MAC  EBL:  No intake/output data recorded.  BLOOD ADMINISTERED:none  DRAINS: none   LOCAL MEDICATIONS USED:  MARCAINE    and XYLOCAINE   SPECIMEN:  No Specimen  DISPOSITION OF SPECIMEN:  N/A  COUNTS:  YES  TOURNIQUET:  * No tourniquets in log *  IMPLANTS: Bone cement  DICTATION: .Dragon Dictation patient was brought the operating room and after adequate sedation was given, patient was placed prone on the operative table. C-arm was brought in and good visualization in both AP and lateral projections of L4 were obtained. Appropriate patient identification and timeout procedure completed and local anesthetic was infiltrated subcutaneously on the right and left sides. The back was then prepped and draped in sterile fashion and repeat timeout procedure carried out. A spinal needle was used to get down to the L4 pedicle on the right side with local anesthetic infiltrated from the bone to the skin with a 50-50 mix of 1% Xylocaine have percent Sensorcaine with epinephrine. After allowing this to set a small incision was made and the trocar advanced in entered the vertebral body care being taken to avoid the spinal canal and neural foramen. Biopsy was attempted but no material obtained. Drilling was carried out and a balloon inflated to 3-1/2 cc. This crossed the midline and after this the balloon had been inflated cement was mixed and when it was the appropriate consistency the balloon was let down and cement inserted into the defect approximate 4 to have cc of bone cement was infiltrated into the vertebral body with very good fill with interdigitation after adequate fixation had been felt  to been obtained the trocar was left in place until the cement was set trochars removed and permanent AP lateral images obtained. The wound was closed with Dermabond and covered with a Band-Aid  PLAN OF CARE: Discharge to home after PACU  PATIENT DISPOSITION:  PACU - hemodynamically stable.

## 2016-12-17 NOTE — Anesthesia Post-op Follow-up Note (Cosign Needed)
Anesthesia QCDR form completed.        

## 2016-12-17 NOTE — Anesthesia Preprocedure Evaluation (Signed)
Anesthesia Evaluation  Patient identified by MRN, date of birth, ID band Patient awake    Reviewed: Allergy & Precautions, H&P , NPO status , Patient's Chart, lab work & pertinent test results  History of Anesthesia Complications (+) history of anesthetic complications (POCD)  Airway Mallampati: III  TM Distance: <3 FB Neck ROM: limited    Dental  (+) Poor Dentition, Chipped, Missing, Partial Lower, Partial Upper   Pulmonary neg pulmonary ROS, neg shortness of breath,    Pulmonary exam normal breath sounds clear to auscultation       Cardiovascular Exercise Tolerance: Good (-) angina+ Peripheral Vascular Disease  (-) Past MI and (-) DOE Normal cardiovascular exam+ Valvular Problems/Murmurs  Rhythm:regular Rate:Normal     Neuro/Psych negative neurological ROS  negative psych ROS   GI/Hepatic Neg liver ROS, GERD  Controlled,  Endo/Other  negative endocrine ROS  Renal/GU Renal disease  negative genitourinary   Musculoskeletal  (+) Arthritis ,   Abdominal   Peds  Hematology negative hematology ROS (+)   Anesthesia Other Findings Past Medical History: No date: Arthritis No date: Atherosclerosis No date: Atherosclerosis of abdominal aorta (HCC) No date: Chronic kidney disease     Comment: stage 2 No date: CKD (chronic kidney disease) stage 2, GFR 60-8* No date: Collagen vascular disease ([place]) 2008: Diverticulosis No date: GERD (gastroesophageal reflux disease) No date: Heart murmur No date: Hyperlipidemia No date: Lumbar spinal stenosis No date: Osteoporosis No date: Ovarian lump No date: RA (rheumatoid arthritis) ([place]) No date: Raynaud's disease No date: Rheumatoid arthritis ([place]) No date: Spinal stenosis  Past Surgical History: 1965: BREAST SURGERY Left     Comment: Benign biopsy 2005: CHOLECYSTECTOMY     Comment: [place] 2008: COLONOSCOPY     Comment: Dr. Donnella Sham 1965: EXCISION OF BREAST BIOPSY  Left     Comment: benign No date: EYE SURGERY Bilateral     Comment: Cataract Extraction with IOL  BMI    Body Mass Index:  20.31 kg/m      Reproductive/Obstetrics negative OB ROS                             Anesthesia Physical Anesthesia Plan  ASA: III  Anesthesia Plan: General   Post-op Pain Management:    Induction:   Airway Management Planned:   Additional Equipment:   Intra-op Plan:   Post-operative Plan:   Informed Consent: I have reviewed the patients History and Physical, chart, labs and discussed the procedure including the risks, benefits and alternatives for the proposed anesthesia with the patient or authorized representative who has indicated his/her understanding and acceptance.   Dental Advisory Given  Plan Discussed with: Anesthesiologist, CRNA and Surgeon  Anesthesia Plan Comments:         Anesthesia Quick Evaluation

## 2016-12-17 NOTE — Transfer of Care (Signed)
Immediate Anesthesia Transfer of Care Note  Patient: [name]  Procedure(s) Performed: Procedure(s) with comments: KYPHOPLASTY (N/A) - l4  Patient Location: PACU  Anesthesia Type:MAC  Level of Consciousness: awake, alert  and oriented  Airway & Oxygen Therapy: Patient connected to nasal cannula oxygen  Post-op Assessment: Post -op Vital signs reviewed and stable  Post vital signs: stable  Last Vitals:  Vitals:   12/17/16 1054 12/17/16 1257  BP: (!) 172/73 (!) 179/85  Pulse: (!) 59 66  Resp: 14 18  Temp: (!) 35.7 C 36.8 C    Last Pain:  Vitals:   12/17/16 1054  TempSrc: Tympanic  PainSc: 4          Complications: No apparent anesthesia complications

## 2016-12-17 NOTE — Anesthesia Postprocedure Evaluation (Signed)
Anesthesia Post Note  Patient: [name]  Procedure(s) Performed: Procedure(s) (LRB): KYPHOPLASTY (N/A)  Patient location during evaluation: PACU Anesthesia Type: General Level of consciousness: awake and alert Pain management: pain level controlled Vital Signs Assessment: post-procedure vital signs reviewed and stable Respiratory status: spontaneous breathing, nonlabored ventilation, respiratory function stable and patient connected to nasal cannula oxygen Cardiovascular status: blood pressure returned to baseline and stable Postop Assessment: no signs of nausea or vomiting Anesthetic complications: no     Last Vitals:  Vitals:   12/17/16 1320 12/17/16 1327  BP:  (!) 172/84  Pulse: 67 64  Resp: 20 17  Temp:  36.9 C    Last Pain:  Vitals:   12/17/16 1327  TempSrc:   PainSc: 6                  Precious Haws Piscitello

## 2016-12-24 ENCOUNTER — Encounter: Payer: Self-pay | Admitting: Family Medicine

## 2016-12-24 ENCOUNTER — Ambulatory Visit (INDEPENDENT_AMBULATORY_CARE_PROVIDER_SITE_OTHER): Payer: Medicare Other | Admitting: Family Medicine

## 2016-12-24 DIAGNOSIS — K6389 Other specified diseases of intestine: Secondary | ICD-10-CM

## 2016-12-24 DIAGNOSIS — M818 Other osteoporosis without current pathological fracture: Secondary | ICD-10-CM

## 2016-12-24 DIAGNOSIS — M545 Low back pain: Secondary | ICD-10-CM | POA: Diagnosis not present

## 2016-12-24 DIAGNOSIS — I771 Stricture of artery: Secondary | ICD-10-CM

## 2016-12-24 DIAGNOSIS — K639 Disease of intestine, unspecified: Secondary | ICD-10-CM | POA: Diagnosis not present

## 2016-12-24 DIAGNOSIS — S32040A Wedge compression fracture of fourth lumbar vertebra, initial encounter for closed fracture: Secondary | ICD-10-CM

## 2016-12-24 DIAGNOSIS — M25551 Pain in right hip: Secondary | ICD-10-CM | POA: Diagnosis not present

## 2016-12-24 DIAGNOSIS — I774 Celiac artery compression syndrome: Secondary | ICD-10-CM | POA: Diagnosis not present

## 2016-12-24 DIAGNOSIS — Z9889 Other specified postprocedural states: Secondary | ICD-10-CM | POA: Diagnosis not present

## 2016-12-24 DIAGNOSIS — M25552 Pain in left hip: Secondary | ICD-10-CM | POA: Diagnosis not present

## 2016-12-24 NOTE — Assessment & Plan Note (Addendum)
S/p kyphoplasty; saw Dr. Rudene Christians this morning; wound site examined, no evidence of infection

## 2016-12-24 NOTE — Patient Instructions (Addendum)
Please request records from GYN in [place] Try timed voiding Please do call to schedule your bone density study; the number to schedule one at either [facility] or [facility] is [phone]  Your goal blood pressure is less than 150 mmHg on top. Try to follow the DASH guidelines (DASH stands for Dietary Approaches to Stop Hypertension) Try to limit the sodium in your diet.  Ideally, consume less than 1.5 grams (less than 1,'500mg'$ ) per day. Do not add salt when cooking or at the table.  Check the sodium amount on labels when shopping, and choose items lower in sodium when given a choice. Avoid or limit foods that already contain a lot of sodium. Eat a diet rich in fruits and vegetables and whole grains.  DASH Eating Plan DASH stands for "Dietary Approaches to Stop Hypertension." The DASH eating plan is a healthy eating plan that has been shown to reduce high blood pressure (hypertension). Additional health benefits may include reducing the risk of type 2 diabetes mellitus, heart disease, and stroke. The DASH eating plan may also help with weight loss. What do I need to know about the DASH eating plan? For the DASH eating plan, you will follow these general guidelines:  Choose foods with less than 150 milligrams of sodium per serving (as listed on the food label).  Use salt-free seasonings or herbs instead of table salt or sea salt.  Check with your health care provider or pharmacist before using salt substitutes.  Eat lower-sodium products. These are often labeled as "low-sodium" or "no salt added."  Eat fresh foods. Avoid eating a lot of canned foods.  Eat more vegetables, fruits, and low-fat dairy products.  Choose whole grains. Look for the word "whole" as the first word in the ingredient list.  Choose fish and skinless chicken or [place] more often than red meat. Limit fish, poultry, and meat to 6 oz (170 g) each day.  Limit sweets, desserts, sugars,  and sugary drinks.  Choose heart-healthy fats.  Eat more home-cooked food and less restaurant, buffet, and fast food.  Limit fried foods.  Do not fry foods. Cook foods using methods such as baking, boiling, grilling, and broiling instead.  When eating at a restaurant, ask that your food be prepared with less salt, or no salt if possible. What foods can I eat? Seek help from a dietitian for individual calorie needs. Grains  Whole grain or whole wheat bread. Brown rice. Whole grain or whole wheat pasta. Quinoa, bulgur, and whole grain cereals. Low-sodium cereals. Corn or whole wheat flour tortillas. Whole grain cornbread. Whole grain crackers. Low-sodium crackers. Vegetables  Fresh or frozen vegetables (raw, steamed, roasted, or grilled). Low-sodium or reduced-sodium tomato and vegetable juices. Low-sodium or reduced-sodium tomato sauce and paste. Low-sodium or reduced-sodium canned vegetables. Fruits  All fresh, canned (in natural juice), or frozen fruits. Meat and Other Protein Products  Ground beef (85% or leaner), grass-fed beef, or beef trimmed of fat. Skinless chicken or [place]. Ground chicken or [place]. Pork trimmed of fat. All fish and seafood. Eggs. Dried beans, peas, or lentils. Unsalted nuts and seeds. Unsalted canned beans. Dairy  Low-fat dairy products, such as skim or 1% milk, 2% or reduced-fat cheeses, low-fat ricotta or cottage cheese, or plain low-fat yogurt. Low-sodium or reduced-sodium cheeses. Fats and Oils  Tub margarines without trans fats. Light or reduced-fat mayonnaise and salad dressings (reduced sodium). Avocado. Safflower, olive, or canola oils. Natural peanut or almond butter. Other  Unsalted popcorn  and pretzels. The items listed above may not be a complete list of recommended foods or beverages. Contact your dietitian for more options.  What foods are not recommended? Grains  White bread. White pasta. White rice. Refined cornbread. Bagels and croissants.  Crackers that contain trans fat. Vegetables  Creamed or fried vegetables. Vegetables in a cheese sauce. Regular canned vegetables. Regular canned tomato sauce and paste. Regular tomato and vegetable juices. Fruits  Canned fruit in light or heavy syrup. Fruit juice. Meat and Other Protein Products  Fatty cuts of meat. Ribs, chicken wings, bacon, sausage, bologna, salami, chitterlings, fatback, hot dogs, bratwurst, and packaged luncheon meats. Salted nuts and seeds. Canned beans with salt. Dairy  Whole or 2% milk, cream, half-and-half, and cream cheese. Whole-fat or sweetened yogurt. Full-fat cheeses or blue cheese. Nondairy creamers and whipped toppings. Processed cheese, cheese spreads, or cheese curds. Condiments  Onion and garlic salt, seasoned salt, table salt, and sea salt. Canned and packaged gravies. Worcestershire sauce. Tartar sauce. Barbecue sauce. Teriyaki sauce. Soy sauce, including reduced sodium. Steak sauce. Fish sauce. Oyster sauce. Cocktail sauce. Horseradish. Ketchup and mustard. Meat flavorings and tenderizers. Bouillon cubes. Hot sauce. Tabasco sauce. Marinades. Taco seasonings. Relishes. Fats and Oils  Butter, stick margarine, lard, shortening, ghee, and bacon fat. Coconut, palm kernel, or palm oils. Regular salad dressings. Other  Pickles and olives. Salted popcorn and pretzels. The items listed above may not be a complete list of foods and beverages to avoid. Contact your dietitian for more information.  Where can I find more information? National Heart, Lung, and Blood Institute: travelstabloid.com This information is not intended to replace advice given to you by your health care provider. Make sure you discuss any questions you have with your health care provider. Document Released: 10/14/2011 Document Revised: 04/01/2016 Document Reviewed: 08/29/2013 Elsevier Interactive Patient Education  2017 [employer].

## 2016-12-24 NOTE — Progress Notes (Signed)
BP (!) 152/78   Pulse 81   Temp 98.3 F (36.8 C) (Oral)   Resp 14   Ht 4' 11.5" (1.511 m)   Wt 99 lb 8 oz (45.1 kg)   SpO2 95%   BMI 19.76 kg/m    Subjective:    Patient ID: [name], female    DOB: January 19, 1927, 82 y.o.   MRN: [id number]  HPI: [name] is a 83 y.o. female  Chief Complaint  Patient presents with  . Annual Exam    medicare wellness    Depression screen [facility] 2/9 12/24/2016 12/08/2016 11/30/2016 02/26/2016 12/18/2015  Decreased Interest 0 0 0 0 0  Down, Depressed, Hopeless 0 0 0 0 0  PHQ - 2 Score 0 0 0 0 0    No flowsheet data found.  Relevant past medical, surgical, family and social history reviewed Past Medical History:  Diagnosis Date  . Arthritis   . Atherosclerosis   . Atherosclerosis of abdominal aorta ([place])   . Chronic kidney disease    stage 2  . CKD (chronic kidney disease) stage 2, GFR 60-89 ml/min   . Collagen vascular disease ([place])   . Diverticulosis 2008  . GERD (gastroesophageal reflux disease)   . Heart murmur   . Hyperlipidemia   . Lumbar spinal stenosis   . Osteoporosis   . Ovarian lump   . RA (rheumatoid arthritis) ([place])   . Raynaud's disease   . Rheumatoid arthritis ([place])   . Spinal stenosis    Past Surgical History:  Procedure Laterality Date  . BREAST SURGERY Left 1965   Benign biopsy  . CHOLECYSTECTOMY  2005   [place]  . COLONOSCOPY  2008   Dr. Donnella Sham  . EXCISION OF BREAST BIOPSY Left 1965   benign  . EYE SURGERY Bilateral    Cataract Extraction with IOL  . KYPHOPLASTY N/A 12/17/2016   Procedure: KYPHOPLASTY;  Surgeon: Hessie Knows, MD;  Location: [facility] ORS;  Service: Orthopedics;  Laterality: N/A;  l4   Family History  Problem Relation Age of Onset  . Hypertension Father   . Cancer Brother     lung, bladder  . Stroke Brother   . Cancer Brother     bladder  . Heart disease Brother   . Hypertension Brother   . Diabetes Sister   . Alcohol abuse Sister   . Kidney disease Sister   .  Diabetes Brother   . Stroke Brother    Social History  Substance Use Topics  . Smoking status: Never Smoker  . Smokeless tobacco: Never Used  . Alcohol use No    Interim medical history since last visit reviewed. Allergies and medications reviewed  Review of Systems Per HPI unless specifically indicated above     Objective:    BP (!) 152/78   Pulse 81   Temp 98.3 F (36.8 C) (Oral)   Resp 14   Ht 4' 11.5" (1.511 m)   Wt 99 lb 8 oz (45.1 kg)   SpO2 95%   BMI 19.76 kg/m   Wt Readings from Last 3 Encounters:  12/24/16 99 lb 8 oz (45.1 kg)  12/17/16 104 lb (47.2 kg)  12/16/16 104 lb (47.2 kg)    Physical Exam  Results for orders placed or performed during the hospital encounter of 12/16/16  Surgical pcr screen  Result Value Ref Range   MRSA, PCR NEGATIVE NEGATIVE   Staphylococcus aureus NEGATIVE NEGATIVE  CBC  Result Value Ref Range  WBC 5.2 3.6 - 11.0 K/uL   RBC 4.60 3.80 - 5.20 MIL/uL   Hemoglobin 13.5 12.0 - 16.0 g/dL   HCT 40.7 35.0 - 47.0 %   MCV 88.6 80.0 - 100.0 fL   MCH 29.3 26.0 - 34.0 pg   MCHC 33.1 32.0 - 36.0 g/dL   RDW 12.7 11.5 - 14.5 %   Platelets 226 150 - 440 K/uL      Assessment & Plan:   Problem List Items Addressed This Visit    None       Follow up plan: No Follow-up on file.  An after-visit summary was printed and given to the patient at [place].  Please see the patient instructions which may contain other information and recommendations beyond what is mentioned above in the assessment and plan.  No orders of the defined types were placed in this encounter.   No orders of the defined types were placed in this encounter.

## 2016-12-24 NOTE — Assessment & Plan Note (Addendum)
With compression fracture; practice good fall precautions; taking supplement for Ca2+; 1000 iu vit D daily; no excessive soft drinks; nonsmoker, no secondhand smoke; using the miacalcin; allergic to raloxefine and bisphosphonates; consider Prolia or daily x 2 years

## 2016-12-24 NOTE — Progress Notes (Signed)
BP (!) 152/78   Pulse 81   Temp 98.3 F (36.8 C) (Oral)   Resp 14   Ht 4' 11.5" (1.511 m)   Wt 99 lb 8 oz (45.1 kg)   SpO2 95%   BMI 19.76 kg/m    Subjective:    Patient ID: [name], female    DOB: 1927-05-18, 83 y.o.   MRN: [id number]  HPI: [name] is a 83 y.o. female  Chief Complaint  Patient presents with  . Annual Exam    medicare wellness   Patient is here with her daughter; since her last visit with me, she had the kyphoplasty at L4 Muscle spasms continue to be a problem in her lower back Heat helps; ice tried too; topical rub works very well, using the patches too Pain is now in the pelvis and hips Pelvic xray done by ortho and she is seeing orthopaedist for this complaint No fevers Takes something for bowels to move; bladder issue not as excessive as when this started Nothing in path to bathroom; wouldn't use bedside commode BP was up today and at ortho; taking NSAIDs; pain can raise BP; no excess salt, no decongestnats We reviewed her recent scans, which suggested a 3 cm cecal mass and then the definitive study did not show any cecal mass which was a relief The scan from last summer showing severe stenosis of the celiac artery was most recently read as mild / minor  Depression screen [facility] 2/9 12/24/2016 12/08/2016 11/30/2016 02/26/2016 12/18/2015  Decreased Interest 0 0 0 0 0  Down, Depressed, Hopeless 0 0 0 0 0  PHQ - 2 Score 0 0 0 0 0   Relevant past medical, surgical, family and social history reviewed Past Medical History:  Diagnosis Date  . Arthritis   . Atherosclerosis   . Atherosclerosis of abdominal aorta ([place])   . Chronic kidney disease    stage 2  . CKD (chronic kidney disease) stage 2, GFR 60-89 ml/min   . Collagen vascular disease ([place])   . Diverticulosis 2008  . GERD (gastroesophageal reflux disease)   . Heart murmur   . Hyperlipidemia   . Lumbar spinal stenosis   . Osteoporosis   . Ovarian lump   . RA (rheumatoid  arthritis) ([place])   . Raynaud's disease   . Rheumatoid arthritis ([place])   . Spinal stenosis    Past Surgical History:  Procedure Laterality Date  . BREAST SURGERY Left 1965   Benign biopsy  . CHOLECYSTECTOMY  2005   [place]  . COLONOSCOPY  2008   Dr. Donnella Sham  . EXCISION OF BREAST BIOPSY Left 1965   benign  . EYE SURGERY Bilateral    Cataract Extraction with IOL  . KYPHOPLASTY N/A 12/17/2016   Procedure: KYPHOPLASTY;  Surgeon: Hessie Knows, MD;  Location: [facility] ORS;  Service: Orthopedics;  Laterality: N/A;  l4   Family History  Problem Relation Age of Onset  . Hypertension Father   . Cancer Brother     lung, bladder  . Stroke Brother   . Cancer Brother     bladder  . Heart disease Brother   . Hypertension Brother   . Diabetes Sister   . Alcohol abuse Sister   . Kidney disease Sister   . Diabetes Brother   . Stroke Brother    Social History  Substance Use Topics  . Smoking status: Never Smoker  . Smokeless tobacco: Never Used  . Alcohol use No  Interim medical history since last visit reviewed. Allergies and medications reviewed  Review of Systems Per HPI unless specifically indicated above     Objective:    BP (!) 152/78   Pulse 81   Temp 98.3 F (36.8 C) (Oral)   Resp 14   Ht 4' 11.5" (1.511 m)   Wt 99 lb 8 oz (45.1 kg)   SpO2 95%   BMI 19.76 kg/m   Wt Readings from Last 3 Encounters:  12/24/16 99 lb 8 oz (45.1 kg)  12/17/16 104 lb (47.2 kg)  12/16/16 104 lb (47.2 kg)    Physical Exam  Constitutional: She appears well-developed and well-nourished. No distress.  Elderly thin lady in no distress; weight stable  Cardiovascular: Normal rate and regular rhythm.   Pulmonary/Chest: Effort normal and breath sounds normal.  Abdominal: Soft. Bowel sounds are normal. She exhibits no abdominal bruit.  Genitourinary:  Genitourinary Comments: fullness noted right side pelvis  Musculoskeletal:       Back:  Site of kyphoplasty injection c/d with adhesive  dressing which pulled away; dressed with antibacterial ointment and bandage; no fluctuance  Neurological: She is alert.  Skin: No rash noted. She is not diaphoretic. No pallor.  Psychiatric: Her mood appears not anxious. Cognition and memory are not impaired. She does not exhibit a depressed mood. She exhibits normal recent memory and normal remote memory.  Very pleasant      Assessment & Plan:   Problem List Items Addressed This Visit      Cardiovascular and Mediastinum   Celiac artery stenosis ([place])    Reported as "severe" last summer, now mild/minor; no further work-up or treatment at this time        Digestive   Mass of cecum    Noted on one study, not apparent at all to radiologist on the definitive study; reviewed reports with patient and her daughter        Musculoskeletal and Integument   Osteoporosis    With compression fracture; practice good fall precautions; taking supplement for Ca2+; 1000 iu vit D daily; no excessive soft drinks; nonsmoker, no secondhand smoke; using the miacalcin; allergic to raloxefine and bisphosphonates; consider Prolia or daily x 2 years      Relevant Orders   DG Bone Density   Compression fracture of L4 lumbar vertebra (HCC)    S/p kyphoplasty; saw Dr. Rudene Christians this morning; wound site examined, no evidence of infection      Relevant Orders   DG Bone Density      Follow up plan: Return in about 4 weeks (around 01/21/2017) for Medicare wellness visit.  An after-visit summary was printed and given to the patient at [place].  Please see the patient instructions which may contain other information and recommendations beyond what is mentioned above in the assessment and plan.  No orders of the defined types were placed in this encounter.   Orders Placed This Encounter  Procedures  . DG Bone Density   Face-to-face time with patient was more than 25 minutes, >50% time spent counseling and coordination of care

## 2016-12-28 ENCOUNTER — Other Ambulatory Visit: Payer: Medicare Other

## 2016-12-28 DIAGNOSIS — R102 Pelvic and perineal pain: Secondary | ICD-10-CM | POA: Diagnosis not present

## 2016-12-28 DIAGNOSIS — R350 Frequency of micturition: Secondary | ICD-10-CM | POA: Diagnosis not present

## 2016-12-29 DIAGNOSIS — M19042 Primary osteoarthritis, left hand: Secondary | ICD-10-CM | POA: Diagnosis not present

## 2016-12-29 DIAGNOSIS — M19041 Primary osteoarthritis, right hand: Secondary | ICD-10-CM | POA: Diagnosis not present

## 2016-12-29 DIAGNOSIS — M8448XD Pathological fracture, other site, subsequent encounter for fracture with routine healing: Secondary | ICD-10-CM | POA: Diagnosis not present

## 2016-12-29 DIAGNOSIS — I7300 Raynaud's syndrome without gangrene: Secondary | ICD-10-CM | POA: Diagnosis not present

## 2016-12-29 DIAGNOSIS — M160 Bilateral primary osteoarthritis of hip: Secondary | ICD-10-CM | POA: Diagnosis not present

## 2016-12-29 DIAGNOSIS — M9963 Osseous and subluxation stenosis of intervertebral foramina of lumbar region: Secondary | ICD-10-CM | POA: Diagnosis not present

## 2016-12-30 DIAGNOSIS — M160 Bilateral primary osteoarthritis of hip: Secondary | ICD-10-CM | POA: Diagnosis not present

## 2016-12-30 DIAGNOSIS — M19042 Primary osteoarthritis, left hand: Secondary | ICD-10-CM | POA: Diagnosis not present

## 2016-12-30 DIAGNOSIS — M9963 Osseous and subluxation stenosis of intervertebral foramina of lumbar region: Secondary | ICD-10-CM | POA: Diagnosis not present

## 2016-12-30 DIAGNOSIS — M8448XD Pathological fracture, other site, subsequent encounter for fracture with routine healing: Secondary | ICD-10-CM | POA: Diagnosis not present

## 2016-12-30 DIAGNOSIS — I7300 Raynaud's syndrome without gangrene: Secondary | ICD-10-CM | POA: Diagnosis not present

## 2016-12-30 DIAGNOSIS — M19041 Primary osteoarthritis, right hand: Secondary | ICD-10-CM | POA: Diagnosis not present

## 2017-01-02 NOTE — Assessment & Plan Note (Signed)
Noted on one study, not apparent at all to radiologist on the definitive study; reviewed reports with patient and her daughter

## 2017-01-02 NOTE — Assessment & Plan Note (Signed)
Reported as "severe" last summer, now mild/minor; no further work-up or treatment at this time

## 2017-01-03 DIAGNOSIS — M19041 Primary osteoarthritis, right hand: Secondary | ICD-10-CM | POA: Diagnosis not present

## 2017-01-03 DIAGNOSIS — M19042 Primary osteoarthritis, left hand: Secondary | ICD-10-CM | POA: Diagnosis not present

## 2017-01-03 DIAGNOSIS — M8448XD Pathological fracture, other site, subsequent encounter for fracture with routine healing: Secondary | ICD-10-CM | POA: Diagnosis not present

## 2017-01-03 DIAGNOSIS — M160 Bilateral primary osteoarthritis of hip: Secondary | ICD-10-CM | POA: Diagnosis not present

## 2017-01-03 DIAGNOSIS — M9963 Osseous and subluxation stenosis of intervertebral foramina of lumbar region: Secondary | ICD-10-CM | POA: Diagnosis not present

## 2017-01-03 DIAGNOSIS — I7300 Raynaud's syndrome without gangrene: Secondary | ICD-10-CM | POA: Diagnosis not present

## 2017-01-05 DIAGNOSIS — M160 Bilateral primary osteoarthritis of hip: Secondary | ICD-10-CM | POA: Diagnosis not present

## 2017-01-05 DIAGNOSIS — M19041 Primary osteoarthritis, right hand: Secondary | ICD-10-CM | POA: Diagnosis not present

## 2017-01-05 DIAGNOSIS — I7300 Raynaud's syndrome without gangrene: Secondary | ICD-10-CM | POA: Diagnosis not present

## 2017-01-05 DIAGNOSIS — M8448XD Pathological fracture, other site, subsequent encounter for fracture with routine healing: Secondary | ICD-10-CM | POA: Diagnosis not present

## 2017-01-05 DIAGNOSIS — M19042 Primary osteoarthritis, left hand: Secondary | ICD-10-CM | POA: Diagnosis not present

## 2017-01-05 DIAGNOSIS — M9963 Osseous and subluxation stenosis of intervertebral foramina of lumbar region: Secondary | ICD-10-CM | POA: Diagnosis not present

## 2017-01-06 ENCOUNTER — Ambulatory Visit
Admission: RE | Admit: 2017-01-06 | Discharge: 2017-01-06 | Disposition: A | Discharge status: Home / Self Care | Payer: Medicare Other | Source: Ambulatory Visit | Attending: Family Medicine | Admitting: Family Medicine

## 2017-01-06 DIAGNOSIS — M818 Other osteoporosis without current pathological fracture: Secondary | ICD-10-CM | POA: Diagnosis not present

## 2017-01-06 DIAGNOSIS — Z9889 Other specified postprocedural states: Secondary | ICD-10-CM | POA: Diagnosis not present

## 2017-01-06 DIAGNOSIS — M810 Age-related osteoporosis without current pathological fracture: Secondary | ICD-10-CM | POA: Diagnosis not present

## 2017-01-07 ENCOUNTER — Telehealth: Payer: Self-pay | Admitting: Family Medicine

## 2017-01-07 DIAGNOSIS — M48061 Spinal stenosis, lumbar region without neurogenic claudication: Secondary | ICD-10-CM

## 2017-01-07 DIAGNOSIS — R102 Pelvic and perineal pain: Secondary | ICD-10-CM

## 2017-01-07 DIAGNOSIS — M545 Low back pain, unspecified: Secondary | ICD-10-CM

## 2017-01-07 MED ORDER — GABAPENTIN 100 MG PO CAPS: ORAL_CAPSULE | ORAL | 0 refills | Status: DC

## 2017-01-07 NOTE — Telephone Encounter (Signed)
Requesting return call from Dr Sanda Klein pertaining to the pain that she has been enduring [phone]

## 2017-01-07 NOTE — Telephone Encounter (Signed)
I spoke with the patient She is not getting any better Her pain is in the whole bottom, muscle spasms aren't constant like they were, but they are there and down left and right leg Still having pressure in the abdomen; getting up in the middle of night to urinate She saw GYN last week in [place] and they went over last year's report She could not find anything had changed; she did not feel any change, no ovarian mass; the pain is still in the vaginal area She wants to go back to the drawing board Back is better; pain is all lower; electric shocks around bottom; sounds nerve Had bone scan yesterday; we reviewed the results, negative T-score -3 femoral neck, -2.8 spine She can't take any of the bisphonates, [place] Ask radiology to look again at cecum and pelvis and spine Consider pain referral ------------------------------------

## 2017-01-09 ENCOUNTER — Other Ambulatory Visit: Payer: Self-pay | Admitting: Family Medicine

## 2017-01-10 DIAGNOSIS — M9963 Osseous and subluxation stenosis of intervertebral foramina of lumbar region: Secondary | ICD-10-CM | POA: Diagnosis not present

## 2017-01-10 DIAGNOSIS — M19041 Primary osteoarthritis, right hand: Secondary | ICD-10-CM | POA: Diagnosis not present

## 2017-01-10 DIAGNOSIS — M160 Bilateral primary osteoarthritis of hip: Secondary | ICD-10-CM | POA: Diagnosis not present

## 2017-01-10 DIAGNOSIS — I7300 Raynaud's syndrome without gangrene: Secondary | ICD-10-CM | POA: Diagnosis not present

## 2017-01-10 DIAGNOSIS — M8448XD Pathological fracture, other site, subsequent encounter for fracture with routine healing: Secondary | ICD-10-CM | POA: Diagnosis not present

## 2017-01-10 DIAGNOSIS — M19042 Primary osteoarthritis, left hand: Secondary | ICD-10-CM | POA: Diagnosis not present

## 2017-01-10 NOTE — Telephone Encounter (Signed)
Rx called in personally to [facility]

## 2017-01-11 NOTE — Telephone Encounter (Signed)
Pt requesting return call pertaining to MRI. States Dr Sanda Klein was going to return her call but never did. [phone]

## 2017-01-11 NOTE — Telephone Encounter (Signed)
Spoke with pt and I read to hear what was written. She verbalized understanding and states she apologizes for you being sick and hope you are feeling better.

## 2017-01-11 NOTE — Telephone Encounter (Signed)
I spoke with her at length on Friday, then was out sick yesterday I'm waiting to hear back from radiologist If she doesn't hear from me by Thursday, please call me back

## 2017-01-13 NOTE — Assessment & Plan Note (Signed)
Refer to pain clinic? 

## 2017-01-13 NOTE — Telephone Encounter (Signed)
Dr. Nyoka Cowden gave me a call back; he looked at both scans, the prior cecal mass looked like stool and contrast, nothing on the f/u; no mass I explained she's still in pain; he sees nothing from an imaging standpoint I thanked him for looking and getting back to me --------------------------------- I talked with patient, and explained above Nothing that antibiotics or surgery, etc would take care of to help her pain (no tumor, no infection, no inflammation, etc.) I'll be happy at this point to refer her to a pain doctor to see if they can offer her relief, perhaps a nerve block, something to help if nerve-related pain; she agrees She is not taking gabapentin (read the potential side effects); tramadol and tylenol are holding her for now

## 2017-01-13 NOTE — Telephone Encounter (Signed)
I sent a message to the radiologist who read her last imaging; I have not heard back from him (perhaps he is out of town) Please contact radiology department and ask if the last radiologist can talk to me after looking at her images She is still having pain; cecal mass was there one time, gone the next, just want someone to review her films, look for anything that might need f/u or be causing her pain

## 2017-01-14 DIAGNOSIS — M160 Bilateral primary osteoarthritis of hip: Secondary | ICD-10-CM | POA: Diagnosis not present

## 2017-01-14 DIAGNOSIS — M9963 Osseous and subluxation stenosis of intervertebral foramina of lumbar region: Secondary | ICD-10-CM | POA: Diagnosis not present

## 2017-01-14 DIAGNOSIS — I7300 Raynaud's syndrome without gangrene: Secondary | ICD-10-CM | POA: Diagnosis not present

## 2017-01-14 DIAGNOSIS — M19041 Primary osteoarthritis, right hand: Secondary | ICD-10-CM | POA: Diagnosis not present

## 2017-01-14 DIAGNOSIS — M8448XD Pathological fracture, other site, subsequent encounter for fracture with routine healing: Secondary | ICD-10-CM | POA: Diagnosis not present

## 2017-01-14 DIAGNOSIS — M19042 Primary osteoarthritis, left hand: Secondary | ICD-10-CM | POA: Diagnosis not present

## 2017-01-18 ENCOUNTER — Telehealth: Payer: Self-pay

## 2017-01-18 DIAGNOSIS — M9963 Osseous and subluxation stenosis of intervertebral foramina of lumbar region: Secondary | ICD-10-CM | POA: Diagnosis not present

## 2017-01-18 DIAGNOSIS — M19041 Primary osteoarthritis, right hand: Secondary | ICD-10-CM | POA: Diagnosis not present

## 2017-01-18 DIAGNOSIS — S32040D Wedge compression fracture of fourth lumbar vertebra, subsequent encounter for fracture with routine healing: Secondary | ICD-10-CM

## 2017-01-18 DIAGNOSIS — M8448XD Pathological fracture, other site, subsequent encounter for fracture with routine healing: Secondary | ICD-10-CM | POA: Diagnosis not present

## 2017-01-18 DIAGNOSIS — S32020S Wedge compression fracture of second lumbar vertebra, sequela: Secondary | ICD-10-CM

## 2017-01-18 DIAGNOSIS — M19042 Primary osteoarthritis, left hand: Secondary | ICD-10-CM | POA: Diagnosis not present

## 2017-01-18 DIAGNOSIS — R102 Pelvic and perineal pain: Secondary | ICD-10-CM

## 2017-01-18 DIAGNOSIS — M48061 Spinal stenosis, lumbar region without neurogenic claudication: Secondary | ICD-10-CM

## 2017-01-18 DIAGNOSIS — M160 Bilateral primary osteoarthritis of hip: Secondary | ICD-10-CM | POA: Diagnosis not present

## 2017-01-18 DIAGNOSIS — I7300 Raynaud's syndrome without gangrene: Secondary | ICD-10-CM | POA: Diagnosis not present

## 2017-01-18 NOTE — Telephone Encounter (Signed)
Pt mention to me you referred her to pain clinic however she's not better and her daughter would like for her to see Miguel Barrera in [place] to see what their observation are. Gave pt our fax number because I mention to her if that is something she decide to do you would like to see their results.

## 2017-01-18 NOTE — Telephone Encounter (Signed)
Absolutely I just put the referral in to the neurosurgeon's office I hope they are able to help her with her pain

## 2017-01-18 NOTE — Telephone Encounter (Signed)
Spoke with pt. she has been notified. The referral has been put in and all the information has been faxed over. I mention to her that now she will receive a call either from our office or theres to schedule an appointment.

## 2017-01-20 DIAGNOSIS — M48062 Spinal stenosis, lumbar region with neurogenic claudication: Secondary | ICD-10-CM | POA: Diagnosis not present

## 2017-01-20 DIAGNOSIS — M545 Low back pain: Secondary | ICD-10-CM | POA: Diagnosis not present

## 2017-01-21 ENCOUNTER — Telehealth: Payer: Self-pay

## 2017-01-21 DIAGNOSIS — M19042 Primary osteoarthritis, left hand: Secondary | ICD-10-CM | POA: Diagnosis not present

## 2017-01-21 DIAGNOSIS — M8448XD Pathological fracture, other site, subsequent encounter for fracture with routine healing: Secondary | ICD-10-CM | POA: Diagnosis not present

## 2017-01-21 DIAGNOSIS — M19041 Primary osteoarthritis, right hand: Secondary | ICD-10-CM | POA: Diagnosis not present

## 2017-01-21 DIAGNOSIS — M160 Bilateral primary osteoarthritis of hip: Secondary | ICD-10-CM | POA: Diagnosis not present

## 2017-01-21 DIAGNOSIS — M9963 Osseous and subluxation stenosis of intervertebral foramina of lumbar region: Secondary | ICD-10-CM | POA: Diagnosis not present

## 2017-01-21 DIAGNOSIS — I7300 Raynaud's syndrome without gangrene: Secondary | ICD-10-CM | POA: Diagnosis not present

## 2017-01-21 NOTE — Telephone Encounter (Signed)
I'm glad that she saw him and I hope he'll be able to help her with her pain; please thank her for calling with the update

## 2017-01-21 NOTE — Telephone Encounter (Signed)
Pt saw Mallie Mussel A. Pool, MD @ [facility] in [place] yesterday. Pt states he found a bone in the area where she was having  pain and its pushing down on a nerve.

## 2017-01-24 ENCOUNTER — Telehealth: Payer: Self-pay | Admitting: Family Medicine

## 2017-01-24 DIAGNOSIS — I7300 Raynaud's syndrome without gangrene: Secondary | ICD-10-CM | POA: Diagnosis not present

## 2017-01-24 DIAGNOSIS — M9963 Osseous and subluxation stenosis of intervertebral foramina of lumbar region: Secondary | ICD-10-CM | POA: Diagnosis not present

## 2017-01-24 DIAGNOSIS — M19041 Primary osteoarthritis, right hand: Secondary | ICD-10-CM | POA: Diagnosis not present

## 2017-01-24 DIAGNOSIS — M549 Dorsalgia, unspecified: Secondary | ICD-10-CM

## 2017-01-24 DIAGNOSIS — M8448XD Pathological fracture, other site, subsequent encounter for fracture with routine healing: Secondary | ICD-10-CM | POA: Diagnosis not present

## 2017-01-24 DIAGNOSIS — M160 Bilateral primary osteoarthritis of hip: Secondary | ICD-10-CM | POA: Diagnosis not present

## 2017-01-24 DIAGNOSIS — M19042 Primary osteoarthritis, left hand: Secondary | ICD-10-CM | POA: Diagnosis not present

## 2017-01-24 NOTE — Telephone Encounter (Signed)
Asking that you send referral to Dr Elta Guadeloupe [facility] in [place] [phone]. Went to see Dr Trenton Gammon in [facility] in [place] but is preferring to see Dr Carloyn Manner. Pt would like to let you know that she has family in [place]

## 2017-01-24 NOTE — Telephone Encounter (Signed)
If you don't mind, please enter the referral and I'll be glad to co-sign; thank you

## 2017-01-26 ENCOUNTER — Encounter: Payer: Medicare Other | Admitting: Family Medicine

## 2017-01-26 DIAGNOSIS — M48062 Spinal stenosis, lumbar region with neurogenic claudication: Secondary | ICD-10-CM | POA: Diagnosis not present

## 2017-01-27 ENCOUNTER — Telehealth: Payer: Self-pay | Admitting: Family Medicine

## 2017-01-27 ENCOUNTER — Other Ambulatory Visit: Payer: Self-pay

## 2017-01-27 DIAGNOSIS — M48061 Spinal stenosis, lumbar region without neurogenic claudication: Secondary | ICD-10-CM

## 2017-01-27 NOTE — Telephone Encounter (Signed)
Please see note scanned in media 01/26/17 Contact patient, refer or schedule to her wishes

## 2017-01-28 DIAGNOSIS — M19042 Primary osteoarthritis, left hand: Secondary | ICD-10-CM | POA: Diagnosis not present

## 2017-01-28 DIAGNOSIS — M160 Bilateral primary osteoarthritis of hip: Secondary | ICD-10-CM | POA: Diagnosis not present

## 2017-01-28 DIAGNOSIS — I7300 Raynaud's syndrome without gangrene: Secondary | ICD-10-CM | POA: Diagnosis not present

## 2017-01-28 DIAGNOSIS — M9963 Osseous and subluxation stenosis of intervertebral foramina of lumbar region: Secondary | ICD-10-CM | POA: Diagnosis not present

## 2017-01-28 DIAGNOSIS — M19041 Primary osteoarthritis, right hand: Secondary | ICD-10-CM | POA: Diagnosis not present

## 2017-01-28 DIAGNOSIS — M8448XD Pathological fracture, other site, subsequent encounter for fracture with routine healing: Secondary | ICD-10-CM | POA: Diagnosis not present

## 2017-01-28 NOTE — Telephone Encounter (Signed)
Patient stated that her niece is a Marine scientist and spoke with  Dr. Carloyn Manner then explained why he declined accepting her as a patient.   She was told that surgery is the only option so she wanted to be referred to Dr. Adria Dill with [facility] in [place].  Referral has been sent. Confirmation received.

## 2017-02-12 ENCOUNTER — Other Ambulatory Visit: Payer: Self-pay | Admitting: Family Medicine

## 2017-02-14 NOTE — Telephone Encounter (Signed)
[place] web site reviewed for last 12 months Rx approved

## 2017-02-15 ENCOUNTER — Encounter: Payer: Medicare Other | Admitting: Family Medicine

## 2017-02-27 DIAGNOSIS — M8448XD Pathological fracture, other site, subsequent encounter for fracture with routine healing: Secondary | ICD-10-CM | POA: Diagnosis not present

## 2017-02-27 DIAGNOSIS — M160 Bilateral primary osteoarthritis of hip: Secondary | ICD-10-CM | POA: Diagnosis not present

## 2017-02-27 DIAGNOSIS — I7300 Raynaud's syndrome without gangrene: Secondary | ICD-10-CM | POA: Diagnosis not present

## 2017-02-27 DIAGNOSIS — M19041 Primary osteoarthritis, right hand: Secondary | ICD-10-CM | POA: Diagnosis not present

## 2017-02-27 DIAGNOSIS — M9963 Osseous and subluxation stenosis of intervertebral foramina of lumbar region: Secondary | ICD-10-CM | POA: Diagnosis not present

## 2017-02-27 DIAGNOSIS — M19042 Primary osteoarthritis, left hand: Secondary | ICD-10-CM | POA: Diagnosis not present

## 2017-03-08 DIAGNOSIS — M9963 Osseous and subluxation stenosis of intervertebral foramina of lumbar region: Secondary | ICD-10-CM | POA: Diagnosis not present

## 2017-03-08 DIAGNOSIS — M19042 Primary osteoarthritis, left hand: Secondary | ICD-10-CM | POA: Diagnosis not present

## 2017-03-08 DIAGNOSIS — M19041 Primary osteoarthritis, right hand: Secondary | ICD-10-CM | POA: Diagnosis not present

## 2017-03-08 DIAGNOSIS — M160 Bilateral primary osteoarthritis of hip: Secondary | ICD-10-CM | POA: Diagnosis not present

## 2017-03-08 DIAGNOSIS — I7300 Raynaud's syndrome without gangrene: Secondary | ICD-10-CM | POA: Diagnosis not present

## 2017-03-08 DIAGNOSIS — M8448XD Pathological fracture, other site, subsequent encounter for fracture with routine healing: Secondary | ICD-10-CM | POA: Diagnosis not present

## 2017-03-10 DIAGNOSIS — M19041 Primary osteoarthritis, right hand: Secondary | ICD-10-CM | POA: Diagnosis not present

## 2017-03-10 DIAGNOSIS — M9963 Osseous and subluxation stenosis of intervertebral foramina of lumbar region: Secondary | ICD-10-CM | POA: Diagnosis not present

## 2017-03-10 DIAGNOSIS — M160 Bilateral primary osteoarthritis of hip: Secondary | ICD-10-CM | POA: Diagnosis not present

## 2017-03-10 DIAGNOSIS — I7300 Raynaud's syndrome without gangrene: Secondary | ICD-10-CM | POA: Diagnosis not present

## 2017-03-10 DIAGNOSIS — M8448XD Pathological fracture, other site, subsequent encounter for fracture with routine healing: Secondary | ICD-10-CM | POA: Diagnosis not present

## 2017-03-10 DIAGNOSIS — M19042 Primary osteoarthritis, left hand: Secondary | ICD-10-CM | POA: Diagnosis not present

## 2017-03-15 DIAGNOSIS — M160 Bilateral primary osteoarthritis of hip: Secondary | ICD-10-CM | POA: Diagnosis not present

## 2017-03-15 DIAGNOSIS — I7300 Raynaud's syndrome without gangrene: Secondary | ICD-10-CM | POA: Diagnosis not present

## 2017-03-15 DIAGNOSIS — M8448XD Pathological fracture, other site, subsequent encounter for fracture with routine healing: Secondary | ICD-10-CM | POA: Diagnosis not present

## 2017-03-15 DIAGNOSIS — M9963 Osseous and subluxation stenosis of intervertebral foramina of lumbar region: Secondary | ICD-10-CM | POA: Diagnosis not present

## 2017-03-15 DIAGNOSIS — M19041 Primary osteoarthritis, right hand: Secondary | ICD-10-CM | POA: Diagnosis not present

## 2017-03-15 DIAGNOSIS — M19042 Primary osteoarthritis, left hand: Secondary | ICD-10-CM | POA: Diagnosis not present

## 2017-03-17 DIAGNOSIS — M19041 Primary osteoarthritis, right hand: Secondary | ICD-10-CM | POA: Diagnosis not present

## 2017-03-17 DIAGNOSIS — M9963 Osseous and subluxation stenosis of intervertebral foramina of lumbar region: Secondary | ICD-10-CM | POA: Diagnosis not present

## 2017-03-17 DIAGNOSIS — M160 Bilateral primary osteoarthritis of hip: Secondary | ICD-10-CM | POA: Diagnosis not present

## 2017-03-17 DIAGNOSIS — I7300 Raynaud's syndrome without gangrene: Secondary | ICD-10-CM | POA: Diagnosis not present

## 2017-03-17 DIAGNOSIS — M8448XD Pathological fracture, other site, subsequent encounter for fracture with routine healing: Secondary | ICD-10-CM | POA: Diagnosis not present

## 2017-03-17 DIAGNOSIS — M19042 Primary osteoarthritis, left hand: Secondary | ICD-10-CM | POA: Diagnosis not present

## 2017-03-22 ENCOUNTER — Other Ambulatory Visit: Payer: Self-pay | Admitting: Family Medicine

## 2017-03-22 DIAGNOSIS — M19041 Primary osteoarthritis, right hand: Secondary | ICD-10-CM | POA: Diagnosis not present

## 2017-03-22 DIAGNOSIS — M160 Bilateral primary osteoarthritis of hip: Secondary | ICD-10-CM | POA: Diagnosis not present

## 2017-03-22 DIAGNOSIS — M8448XD Pathological fracture, other site, subsequent encounter for fracture with routine healing: Secondary | ICD-10-CM | POA: Diagnosis not present

## 2017-03-22 DIAGNOSIS — M9963 Osseous and subluxation stenosis of intervertebral foramina of lumbar region: Secondary | ICD-10-CM | POA: Diagnosis not present

## 2017-03-22 DIAGNOSIS — I7300 Raynaud's syndrome without gangrene: Secondary | ICD-10-CM | POA: Diagnosis not present

## 2017-03-22 DIAGNOSIS — M19042 Primary osteoarthritis, left hand: Secondary | ICD-10-CM | POA: Diagnosis not present

## 2017-03-22 NOTE — Telephone Encounter (Signed)
Reviewed [place] web site over last year; no other prescribers Seeing specialist I'll be glad to refill medicine

## 2017-03-24 DIAGNOSIS — M8448XD Pathological fracture, other site, subsequent encounter for fracture with routine healing: Secondary | ICD-10-CM | POA: Diagnosis not present

## 2017-03-24 DIAGNOSIS — M9963 Osseous and subluxation stenosis of intervertebral foramina of lumbar region: Secondary | ICD-10-CM | POA: Diagnosis not present

## 2017-03-24 DIAGNOSIS — M19042 Primary osteoarthritis, left hand: Secondary | ICD-10-CM | POA: Diagnosis not present

## 2017-03-24 DIAGNOSIS — M160 Bilateral primary osteoarthritis of hip: Secondary | ICD-10-CM | POA: Diagnosis not present

## 2017-03-24 DIAGNOSIS — I7300 Raynaud's syndrome without gangrene: Secondary | ICD-10-CM | POA: Diagnosis not present

## 2017-03-24 DIAGNOSIS — M19041 Primary osteoarthritis, right hand: Secondary | ICD-10-CM | POA: Diagnosis not present

## 2017-03-30 DIAGNOSIS — M9963 Osseous and subluxation stenosis of intervertebral foramina of lumbar region: Secondary | ICD-10-CM | POA: Diagnosis not present

## 2017-03-30 DIAGNOSIS — M19041 Primary osteoarthritis, right hand: Secondary | ICD-10-CM | POA: Diagnosis not present

## 2017-03-30 DIAGNOSIS — M8448XD Pathological fracture, other site, subsequent encounter for fracture with routine healing: Secondary | ICD-10-CM | POA: Diagnosis not present

## 2017-03-30 DIAGNOSIS — M160 Bilateral primary osteoarthritis of hip: Secondary | ICD-10-CM | POA: Diagnosis not present

## 2017-03-30 DIAGNOSIS — I7300 Raynaud's syndrome without gangrene: Secondary | ICD-10-CM | POA: Diagnosis not present

## 2017-03-30 DIAGNOSIS — M19042 Primary osteoarthritis, left hand: Secondary | ICD-10-CM | POA: Diagnosis not present

## 2017-03-31 DIAGNOSIS — M48061 Spinal stenosis, lumbar region without neurogenic claudication: Secondary | ICD-10-CM | POA: Diagnosis not present

## 2017-03-31 DIAGNOSIS — M48062 Spinal stenosis, lumbar region with neurogenic claudication: Secondary | ICD-10-CM | POA: Diagnosis not present

## 2017-04-01 ENCOUNTER — Encounter: Payer: Medicare Other | Admitting: Family Medicine

## 2017-04-01 DIAGNOSIS — K219 Gastro-esophageal reflux disease without esophagitis: Secondary | ICD-10-CM | POA: Diagnosis not present

## 2017-04-01 DIAGNOSIS — I10 Essential (primary) hypertension: Secondary | ICD-10-CM | POA: Diagnosis not present

## 2017-04-01 DIAGNOSIS — Z01818 Encounter for other preprocedural examination: Secondary | ICD-10-CM | POA: Diagnosis not present

## 2017-04-01 DIAGNOSIS — M48062 Spinal stenosis, lumbar region with neurogenic claudication: Secondary | ICD-10-CM | POA: Diagnosis not present

## 2017-04-01 DIAGNOSIS — E785 Hyperlipidemia, unspecified: Secondary | ICD-10-CM | POA: Diagnosis not present

## 2017-04-11 DIAGNOSIS — M160 Bilateral primary osteoarthritis of hip: Secondary | ICD-10-CM | POA: Diagnosis not present

## 2017-04-11 DIAGNOSIS — I7300 Raynaud's syndrome without gangrene: Secondary | ICD-10-CM | POA: Diagnosis not present

## 2017-04-11 DIAGNOSIS — M9963 Osseous and subluxation stenosis of intervertebral foramina of lumbar region: Secondary | ICD-10-CM | POA: Diagnosis not present

## 2017-04-11 DIAGNOSIS — M8448XD Pathological fracture, other site, subsequent encounter for fracture with routine healing: Secondary | ICD-10-CM | POA: Diagnosis not present

## 2017-04-11 DIAGNOSIS — M19042 Primary osteoarthritis, left hand: Secondary | ICD-10-CM | POA: Diagnosis not present

## 2017-04-11 DIAGNOSIS — M19041 Primary osteoarthritis, right hand: Secondary | ICD-10-CM | POA: Diagnosis not present

## 2017-04-13 DIAGNOSIS — M19042 Primary osteoarthritis, left hand: Secondary | ICD-10-CM | POA: Diagnosis not present

## 2017-04-13 DIAGNOSIS — M9963 Osseous and subluxation stenosis of intervertebral foramina of lumbar region: Secondary | ICD-10-CM | POA: Diagnosis not present

## 2017-04-13 DIAGNOSIS — M160 Bilateral primary osteoarthritis of hip: Secondary | ICD-10-CM | POA: Diagnosis not present

## 2017-04-13 DIAGNOSIS — M8448XD Pathological fracture, other site, subsequent encounter for fracture with routine healing: Secondary | ICD-10-CM | POA: Diagnosis not present

## 2017-04-13 DIAGNOSIS — I7300 Raynaud's syndrome without gangrene: Secondary | ICD-10-CM | POA: Diagnosis not present

## 2017-04-13 DIAGNOSIS — M19041 Primary osteoarthritis, right hand: Secondary | ICD-10-CM | POA: Diagnosis not present

## 2017-04-18 DIAGNOSIS — M160 Bilateral primary osteoarthritis of hip: Secondary | ICD-10-CM | POA: Diagnosis not present

## 2017-04-18 DIAGNOSIS — M19041 Primary osteoarthritis, right hand: Secondary | ICD-10-CM | POA: Diagnosis not present

## 2017-04-18 DIAGNOSIS — M9963 Osseous and subluxation stenosis of intervertebral foramina of lumbar region: Secondary | ICD-10-CM | POA: Diagnosis not present

## 2017-04-18 DIAGNOSIS — M8448XD Pathological fracture, other site, subsequent encounter for fracture with routine healing: Secondary | ICD-10-CM | POA: Diagnosis not present

## 2017-04-18 DIAGNOSIS — M19042 Primary osteoarthritis, left hand: Secondary | ICD-10-CM | POA: Diagnosis not present

## 2017-04-18 DIAGNOSIS — I7300 Raynaud's syndrome without gangrene: Secondary | ICD-10-CM | POA: Diagnosis not present

## 2017-04-19 ENCOUNTER — Encounter: Payer: Self-pay | Admitting: Family Medicine

## 2017-04-19 ENCOUNTER — Ambulatory Visit (INDEPENDENT_AMBULATORY_CARE_PROVIDER_SITE_OTHER): Payer: Medicare Other | Admitting: Family Medicine

## 2017-04-19 DIAGNOSIS — Z66 Do not resuscitate: Secondary | ICD-10-CM | POA: Diagnosis not present

## 2017-04-19 DIAGNOSIS — M818 Other osteoporosis without current pathological fracture: Secondary | ICD-10-CM | POA: Diagnosis not present

## 2017-04-19 DIAGNOSIS — Z0000 Encounter for general adult medical examination without abnormal findings: Secondary | ICD-10-CM | POA: Diagnosis not present

## 2017-04-19 NOTE — Assessment & Plan Note (Signed)
Discussed with patient April 19, 2017

## 2017-04-19 NOTE — Assessment & Plan Note (Signed)
She used the nasal spray but lost sense of taste; stopped med and taste returned; discussed Prolia, but she does not want that; already saw Dr. Jefm Bryant (rheumatologist); three servings of calcium a day, taking vitamin D, fall precautions

## 2017-04-19 NOTE — Progress Notes (Signed)
Patient: [name], Female    DOB: [birth year], 83 y.o.   MRN: [id number]  Visit Date: 04/19/2017  Today's Provider: Enid Derry, MD   Chief Complaint  Patient presents with  . Medicare Wellness    Subjective:   [name] is a 83 y.o. female who presents today for her Subsequent Annual Wellness Visit.  Caregiver input:  N/a  She is going to have surgery on July 2nd for her back Her stomach is giving her a fit; has to really watch what she eats; reflux so bad at times; taking a gas pill from GI doctor's office, saw the lady provider; pain under the right ribcage and tightness across upper abdomen; GI was working this up; I asked if she needs to go back and she says she'll go back after back surgery  HPI  Review of Systems  Past Medical History:  Diagnosis Date  . Arthritis   . Atherosclerosis   . Atherosclerosis of abdominal aorta ([place])   . Chronic kidney disease    stage 2  . CKD (chronic kidney disease) stage 2, GFR 60-89 ml/min   . Collagen vascular disease ([place])   . Diverticulosis 2008  . GERD (gastroesophageal reflux disease)   . Heart murmur   . Hyperlipidemia   . Lumbar spinal stenosis   . Osteoporosis   . Ovarian lump   . RA (rheumatoid arthritis) ([place])   . Raynaud's disease   . Rheumatoid arthritis ([place])   . Spinal stenosis   RA has not been a problem for her; takes tylenol every 6-8 hours, tramadol at night Having surgery for her spinal stenosis; she had her pre-operative testing and already had blood work last week Had bone density test in March 2018, saw rheumatologist and does not want Prolia  Past Surgical History:  Procedure Laterality Date  . BREAST SURGERY Left 1965   Benign biopsy  . CHOLECYSTECTOMY  2005   [place]  . COLONOSCOPY  2008   Dr. Donnella Sham  . EXCISION OF BREAST BIOPSY Left 1965   benign  . EYE SURGERY Bilateral    Cataract Extraction with IOL  . KYPHOPLASTY N/A 12/17/2016   Procedure: KYPHOPLASTY;  Surgeon:  Hessie Knows, MD;  Location: [facility] ORS;  Service: Orthopedics;  Laterality: N/A;  l4    Family History  Problem Relation Age of Onset  . Heart disease Mother   . Hypertension Father   . Heart disease Brother 42       heart attack  . Cancer Brother        bladder  . Diabetes Sister   . Alcohol abuse Sister   . Kidney disease Sister   . Diabetes Brother   . Stroke Brother   . Diabetes Maternal Grandmother   . Heart disease Maternal Grandmother   . Cancer Brother        lung    Social History   Social History  . Marital status: Widowed    Spouse name: N/A  . Number of children: N/A  . Years of education: N/A   Occupational History  . Not on file.   Social History Main Topics  . Smoking status: Never Smoker  . Smokeless tobacco: Never Used  . Alcohol use No  . Drug use: No  . Sexual activity: Not on file   Other Topics Concern  . Not on file   Social History Narrative  . No narrative on file    Functional Ability / Safety Screening 1.  Was  the timed Get Up and Go test longer than 30 seconds?  no 2.  Does the patient need help with the phone, transportation, shopping,      preparing meals, housework, laundry, medications, or managing money?  yes 3.  Does the patient's home have:  loose throw rugs in the hallway?   no      Grab bars in the bathroom? yes      Handrails on the stairs?   yes      Poor lighting?   yes, dark in bedroom, she doesn't cut on lights often 4.  Has the patient noticed any hearing difficulties?   no  Fall Risk Assessment See under rooming  Depression Screen See under rooming Depression screen [facility] 2/9 04/19/2017 12/24/2016 12/08/2016 11/30/2016 02/26/2016  Decreased Interest 0 0 0 0 0  Down, Depressed, Hopeless 0 0 0 0 0  PHQ - 2 Score 0 0 0 0 0    Advanced Directives Does patient have a HCPOA?    yes If yes, name and contact information: [name] [phone], [name] secondary Does patient have a living will or MOST form?   yes  Objective:   Vitals: BP (!) 144/72   Pulse 81   Temp 97.8 F (36.6 C) (Oral)   Resp 14   Ht 4' 11.5" (1.511 m)   Wt 103 lb 3.2 oz (46.8 kg)   SpO2 96%   BMI 20.50 kg/m  Body mass index is 20.5 kg/m. No exam data present  Physical Exam Mood/affect:  euthymic Appearance:  Neatly and casually dressed  6CIT Screen 04/19/2017  What Year? 0 points  What month? 0 points  What time? 0 points  Count back from 20 0 points  Months in reverse 0 points  Repeat phrase 0 points  Total Score 0    Assessment & Plan:     Annual Wellness Visit  Reviewed patient's Family Medical History Reviewed and updated list of patient's medical providers Assessment of cognitive impairment was done Assessed patient's functional ability Established a written schedule for health screening [place] Completed and Reviewed  Exercise Activities and Dietary recommendations Goals    None      Immunization History  Administered Date(s) Administered  . Influenza,inj,Quad PF,36+ Mos 08/15/2015  . Influenza-Unspecified 09/13/2016  . Pneumococcal Conjugate-13 12/13/2014  . Pneumococcal Polysaccharide-23 11/08/2000  . Tdap 11/08/2008  . Zoster 11/08/2008    Health Maintenance  Topic Date Due  . INFLUENZA VACCINE  06/08/2017  . TETANUS/TDAP  11/08/2018  . DEXA SCAN  Completed  . PNA vac Low Risk Adult  Completed    Discussed health benefits of physical activity, and encouraged her to engage in regular exercise appropriate for her age and condition.   Meds ordered this encounter  Medications  . MAGNESIUM CARBONATE PO    Sig: Take by mouth.  . Turmeric 450 MG CAPS    Sig: Take 450 Units by mouth.  . co-enzyme Q-10 30 MG capsule    Sig: Take 30 mg by mouth 3 (three) times daily.    Current Outpatient Prescriptions:  .  co-enzyme Q-10 30 MG capsule, Take 30 mg by mouth 3 (three) times daily., Disp: , Rfl:  .  MAGNESIUM CARBONATE PO, Take by mouth., Disp: , Rfl:   .  Turmeric 450 MG CAPS, Take 450 Units by mouth., Disp: , Rfl:  .  Ascorbic Acid (VITAMIN C CR) 500 MG CPCR, Take 500 mg by mouth daily., Disp: , Rfl:  .  Brewers Yeast 487.5 MG TABS, Take 1 tablet by mouth daily. , Disp: , Rfl:  .  Calcium Carbonate-Vitamin D (CALCIUM + D PO), Take 1 tablet by mouth daily. , Disp: , Rfl:  .  Cholecalciferol (VITAMIN D3) 3000 UNITS TABS, Take 1 tablet by mouth daily. , Disp: , Rfl:  .  CINNAMON PO, Take 1 tablet by mouth daily. , Disp: , Rfl:  .  Glucosamine Sulfate 500 MG CAPS, Take 500 mg by mouth daily., Disp: , Rfl:  .  ibuprofen (ADVIL,MOTRIN) 200 MG tablet, Take 200 mg by mouth every 6 (six) hours as needed., Disp: , Rfl:  .  Lactobacillus (PROBIOTIC ACIDOPHILUS PO), Take 1 capsule by mouth daily. , Disp: , Rfl:  .  lidocaine (LIDODERM) 5 %, Place 1 patch onto the skin daily. Apply to lower mid back; remove & discard patch within 12 hours; just 1 patch per day, Disp: 30 patch, Rfl: 0 .  Multiple Vitamin (MULTIVITAMIN) capsule, Take 1 capsule by mouth daily., Disp: , Rfl:  .  Omega-3 Fatty Acids (OMEGA 3 PO), Take 1 tablet by mouth daily. , Disp: , Rfl:  .  traMADol (ULTRAM) 50 MG tablet, TAKE 1/2 TO 1 (ONE-HALF TO ONE) TABLET BY MOUTH EVERY 6 HOURS AS NEEDED, Disp: 20 tablet, Rfl: 0 .  vitamin B-12 (CYANOCOBALAMIN) 1000 MCG tablet, Take 1,000 mcg by mouth daily., Disp: , Rfl:  Medications Discontinued During This Encounter  Medication Reason  . Psyllium (METAMUCIL PO)   . Ginkgo Biloba (GINKOBA PO)   . gabapentin (NEURONTIN) 100 MG capsule   . calcitonin, salmon, (MIACALCIN) 200 UNIT/ACT nasal spray   . aspirin 81 MG tablet     Next Medicare Wellness Visit in 12+ months  Problem List Items Addressed This Visit      Musculoskeletal and Integument   Osteoporosis    She used the nasal spray but lost sense of taste; stopped med and taste returned; discussed Prolia, but she does not want that; already saw Dr. Jefm Bryant (rheumatologist); three servings  of calcium a day, taking vitamin D, fall precautions        Other   Preventative health care    USPSTF grade A and B recommendations reviewed with patient; age-appropriate recommendations, preventive care, screening tests, etc discussed and encouraged; healthy living encouraged; see AVS for patient education given to patient       DNAR (do not attempt resuscitation)    Discussed with patient April 19, 2017

## 2017-04-19 NOTE — Assessment & Plan Note (Signed)
USPSTF grade A and B recommendations reviewed with patient; age-appropriate recommendations, preventive care, screening tests, etc discussed and encouraged; healthy living encouraged; see AVS for patient education given to patient  

## 2017-04-19 NOTE — Patient Instructions (Addendum)
Request recent labs  Health Maintenance, Female Adopting a healthy lifestyle and getting preventive care can go a long way to promote health and wellness. Talk with your health care provider about what schedule of regular examinations is right for you. This is a good chance for you to check in with your provider about disease prevention and staying healthy. In between checkups, there are plenty of things you can do on your own. Experts have done a lot of research about which lifestyle changes and preventive measures are most likely to keep you healthy. Ask your health care provider for more information. Weight and diet Eat a healthy diet  Be sure to include plenty of vegetables, fruits, low-fat dairy products, and lean protein.  Do not eat a lot of foods high in solid fats, added sugars, or salt.  Get regular exercise. This is one of the most important things you can do for your health. ? Most adults should exercise for at least 150 minutes each week. The exercise should increase your heart rate and make you sweat (moderate-intensity exercise). ? Most adults should also do strengthening exercises at least twice a week. This is in addition to the moderate-intensity exercise.  Maintain a healthy weight  Body mass index (BMI) is a measurement that can be used to identify possible weight problems. It estimates body fat based on height and weight. Your health care provider can help determine your BMI and help you achieve or maintain a healthy weight.  For females 52 years of age and older: ? A BMI below 18.5 is considered underweight. ? A BMI of 18.5 to 24.9 is normal. ? A BMI of 25 to 29.9 is considered overweight. ? A BMI of 30 and above is considered obese.  Watch levels of cholesterol and blood lipids  You should start having your blood tested for lipids and cholesterol at 81 years of age, then have this test every 5 years.  You may need to have your cholesterol levels checked more often  if: ? Your lipid or cholesterol levels are high. ? You are older than 81 years of age. ? You are at high risk for heart disease.  Cancer screening Lung Cancer  Lung cancer screening is recommended for adults 33-75 years old who are at high risk for lung cancer because of a history of smoking.  A yearly low-dose CT scan of the lungs is recommended for people who: ? Currently smoke. ? Have quit within the past 15 years. ? Have at least a 30-pack-year history of smoking. A pack year is smoking an average of one pack of cigarettes a day for 1 year.  Yearly screening should continue until it has been 15 years since you quit.  Yearly screening should stop if you develop a health problem that would prevent you from having lung cancer treatment.  Breast Cancer  Practice breast self-awareness. This means understanding how your breasts normally appear and feel.  It also means doing regular breast self-exams. Let your health care provider know about any changes, no matter how small.  If you are in your 20s or 30s, you should have a clinical breast exam (CBE) by a health care provider every 1-3 years as part of a regular health exam.  If you are 39 or older, have a CBE every year. Also consider having a breast X-ray (mammogram) every year.  If you have a family history of breast cancer, talk to your health care provider about genetic screening.  If you  are at high risk for breast cancer, talk to your health care provider about having an MRI and a mammogram every year.  Breast cancer gene (BRCA) assessment is recommended for women who have family members with BRCA-related cancers. BRCA-related cancers include: ? Breast. ? Ovarian. ? Tubal. ? Peritoneal cancers.  Results of the assessment will determine the need for genetic counseling and BRCA1 and BRCA2 testing.  Cervical Cancer Your health care provider may recommend that you be screened regularly for cancer of the pelvic organs  (ovaries, uterus, and vagina). This screening involves a pelvic examination, including checking for microscopic changes to the surface of your cervix (Pap test). You may be encouraged to have this screening done every 3 years, beginning at age 72.  For women ages 67-65, health care providers may recommend pelvic exams and Pap testing every 3 years, or they may recommend the Pap and pelvic exam, combined with testing for human papilloma virus (HPV), every 5 years. Some types of HPV increase your risk of cervical cancer. Testing for HPV may also be done on women of any age with unclear Pap test results.  Other health care providers may not recommend any screening for nonpregnant women who are considered low risk for pelvic cancer and who do not have symptoms. Ask your health care provider if a screening pelvic exam is right for you.  If you have had past treatment for cervical cancer or a condition that could lead to cancer, you need Pap tests and screening for cancer for at least 20 years after your treatment. If Pap tests have been discontinued, your risk factors (such as having a new sexual partner) need to be reassessed to determine if screening should resume. Some women have medical problems that increase the chance of getting cervical cancer. In these cases, your health care provider may recommend more frequent screening and Pap tests.  Colorectal Cancer  This type of cancer can be detected and often prevented.  Routine colorectal cancer screening usually begins at 81 years of age and continues through 81 years of age.  Your health care provider may recommend screening at an earlier age if you have risk factors for colon cancer.  Your health care provider may also recommend using home test kits to check for hidden blood in the stool.  A small camera at the end of a tube can be used to examine your colon directly (sigmoidoscopy or colonoscopy). This is done to check for the earliest forms of  colorectal cancer.  Routine screening usually begins at age 74.  Direct examination of the colon should be repeated every 5-10 years through 81 years of age. However, you may need to be screened more often if early forms of precancerous polyps or small growths are found.  Skin Cancer  Check your skin from head to toe regularly.  Tell your health care provider about any new moles or changes in moles, especially if there is a change in a mole's shape or color.  Also tell your health care provider if you have a mole that is larger than the size of a pencil eraser.  Always use sunscreen. Apply sunscreen liberally and repeatedly throughout the day.  Protect yourself by wearing long sleeves, pants, a wide-brimmed hat, and sunglasses whenever you are outside.  Heart disease, diabetes, and high blood pressure  High blood pressure causes heart disease and increases the risk of stroke. High blood pressure is more likely to develop in: ? People who have blood pressure in  the high end of the normal range (130-139/85-89 mm Hg). ? People who are overweight or obese. ? People who are African American.  If you are 18-39 years of age, have your blood pressure checked every 3-5 years. If you are 40 years of age or older, have your blood pressure checked every year. You should have your blood pressure measured twice-once when you are at a hospital or clinic, and once when you are not at a hospital or clinic. Record the average of the two measurements. To check your blood pressure when you are not at a hospital or clinic, you can use: ? An automated blood pressure machine at a pharmacy. ? A home blood pressure monitor.  If you are between 55 years and 79 years old, ask your health care provider if you should take aspirin to prevent strokes.  Have regular diabetes screenings. This involves taking a blood sample to check your fasting blood sugar level. ? If you are at a normal weight and have a low risk  for diabetes, have this test once every three years after 81 years of age. ? If you are overweight and have a high risk for diabetes, consider being tested at a younger age or more often. Preventing infection Hepatitis B  If you have a higher risk for hepatitis B, you should be screened for this virus. You are considered at high risk for hepatitis B if: ? You were born in a country where hepatitis B is common. Ask your health care provider which countries are considered high risk. ? Your parents were born in a high-risk country, and you have not been immunized against hepatitis B (hepatitis B vaccine). ? You have HIV or AIDS. ? You use needles to inject street drugs. ? You live with someone who has hepatitis B. ? You have had sex with someone who has hepatitis B. ? You get hemodialysis treatment. ? You take certain medicines for conditions, including cancer, organ transplantation, and autoimmune conditions.  Hepatitis C  Blood testing is recommended for: ? Everyone born from 1945 through 1965. ? Anyone with known risk factors for hepatitis C.  Sexually transmitted infections (STIs)  You should be screened for sexually transmitted infections (STIs) including gonorrhea and chlamydia if: ? You are sexually active and are younger than 81 years of age. ? You are older than 81 years of age and your health care provider tells you that you are at risk for this type of infection. ? Your sexual activity has changed since you were last screened and you are at an increased risk for chlamydia or gonorrhea. Ask your health care provider if you are at risk.  If you do not have HIV, but are at risk, it may be recommended that you take a prescription medicine daily to prevent HIV infection. This is called pre-exposure prophylaxis (PrEP). You are considered at risk if: ? You are sexually active and do not regularly use condoms or know the HIV status of your partner(s). ? You take drugs by  injection. ? You are sexually active with a partner who has HIV.  Talk with your health care provider about whether you are at high risk of being infected with HIV. If you choose to begin PrEP, you should first be tested for HIV. You should then be tested every 3 months for as long as you are taking PrEP. Pregnancy  If you are premenopausal and you may become pregnant, ask your health care provider about preconception counseling.    If you may become pregnant, take 400 to 800 micrograms (mcg) of folic acid every day.  If you want to prevent pregnancy, talk to your health care provider about birth control (contraception). Osteoporosis and menopause  Osteoporosis is a disease in which the bones lose minerals and strength with aging. This can result in serious bone fractures. Your risk for osteoporosis can be identified using a bone density scan.  If you are 17 years of age or older, or if you are at risk for osteoporosis and fractures, ask your health care provider if you should be screened.  Ask your health care provider whether you should take a calcium or vitamin D supplement to lower your risk for osteoporosis.  Menopause may have certain physical symptoms and risks.  Hormone replacement therapy may reduce some of these symptoms and risks. Talk to your health care provider about whether hormone replacement therapy is right for you. Follow these instructions at home:  Schedule regular health, dental, and eye exams.  Stay current with your immunizations.  Do not use any tobacco products including cigarettes, chewing tobacco, or electronic cigarettes.  If you are pregnant, do not drink alcohol.  If you are breastfeeding, limit how much and how often you drink alcohol.  Limit alcohol intake to no more than 1 drink per day for nonpregnant women. One drink equals 12 ounces of beer, 5 ounces of wine, or 1 ounces of hard liquor.  Do not use street drugs.  Do not share needles.  Ask  your health care provider for help if you need support or information about quitting drugs.  Tell your health care provider if you often feel depressed.  Tell your health care provider if you have ever been abused or do not feel safe at home. This information is not intended to replace advice given to you by your health care provider. Make sure you discuss any questions you have with your health care provider. Document Released: 05/10/2011 Document Revised: 04/01/2016 Document Reviewed: 07/29/2015 Elsevier Interactive Patient Education  [employer].

## 2017-04-22 DIAGNOSIS — I7300 Raynaud's syndrome without gangrene: Secondary | ICD-10-CM | POA: Diagnosis not present

## 2017-04-22 DIAGNOSIS — M8448XD Pathological fracture, other site, subsequent encounter for fracture with routine healing: Secondary | ICD-10-CM | POA: Diagnosis not present

## 2017-04-22 DIAGNOSIS — M9963 Osseous and subluxation stenosis of intervertebral foramina of lumbar region: Secondary | ICD-10-CM | POA: Diagnosis not present

## 2017-04-22 DIAGNOSIS — M19041 Primary osteoarthritis, right hand: Secondary | ICD-10-CM | POA: Diagnosis not present

## 2017-04-22 DIAGNOSIS — M160 Bilateral primary osteoarthritis of hip: Secondary | ICD-10-CM | POA: Diagnosis not present

## 2017-04-22 DIAGNOSIS — M19042 Primary osteoarthritis, left hand: Secondary | ICD-10-CM | POA: Diagnosis not present

## 2017-04-25 ENCOUNTER — Other Ambulatory Visit: Payer: Self-pay | Admitting: Family Medicine

## 2017-04-25 DIAGNOSIS — M19042 Primary osteoarthritis, left hand: Secondary | ICD-10-CM | POA: Diagnosis not present

## 2017-04-25 DIAGNOSIS — M9963 Osseous and subluxation stenosis of intervertebral foramina of lumbar region: Secondary | ICD-10-CM | POA: Diagnosis not present

## 2017-04-25 DIAGNOSIS — M160 Bilateral primary osteoarthritis of hip: Secondary | ICD-10-CM | POA: Diagnosis not present

## 2017-04-25 DIAGNOSIS — I7300 Raynaud's syndrome without gangrene: Secondary | ICD-10-CM | POA: Diagnosis not present

## 2017-04-25 DIAGNOSIS — M8448XD Pathological fracture, other site, subsequent encounter for fracture with routine healing: Secondary | ICD-10-CM | POA: Diagnosis not present

## 2017-04-25 DIAGNOSIS — M19041 Primary osteoarthritis, right hand: Secondary | ICD-10-CM | POA: Diagnosis not present

## 2017-04-25 NOTE — Telephone Encounter (Signed)
[facility] web site reviewed over th elast 12 months; no red flags Rx approved and phoned in by MD

## 2017-04-25 NOTE — Telephone Encounter (Signed)
I called, and yes they have received.

## 2017-04-25 NOTE — Telephone Encounter (Signed)
I called this Rx in personally this morning; 2nd request received Can you please call pharmacy and confirm they got my Rx voicemail? Thank you

## 2017-04-27 DIAGNOSIS — I7300 Raynaud's syndrome without gangrene: Secondary | ICD-10-CM | POA: Diagnosis not present

## 2017-04-27 DIAGNOSIS — M19042 Primary osteoarthritis, left hand: Secondary | ICD-10-CM | POA: Diagnosis not present

## 2017-04-27 DIAGNOSIS — M19041 Primary osteoarthritis, right hand: Secondary | ICD-10-CM | POA: Diagnosis not present

## 2017-04-27 DIAGNOSIS — M160 Bilateral primary osteoarthritis of hip: Secondary | ICD-10-CM | POA: Diagnosis not present

## 2017-04-27 DIAGNOSIS — M8448XD Pathological fracture, other site, subsequent encounter for fracture with routine healing: Secondary | ICD-10-CM | POA: Diagnosis not present

## 2017-04-27 DIAGNOSIS — M9963 Osseous and subluxation stenosis of intervertebral foramina of lumbar region: Secondary | ICD-10-CM | POA: Diagnosis not present

## 2017-04-28 DIAGNOSIS — M19042 Primary osteoarthritis, left hand: Secondary | ICD-10-CM | POA: Diagnosis not present

## 2017-04-28 DIAGNOSIS — M8448XD Pathological fracture, other site, subsequent encounter for fracture with routine healing: Secondary | ICD-10-CM | POA: Diagnosis not present

## 2017-04-28 DIAGNOSIS — I7300 Raynaud's syndrome without gangrene: Secondary | ICD-10-CM | POA: Diagnosis not present

## 2017-04-28 DIAGNOSIS — M9963 Osseous and subluxation stenosis of intervertebral foramina of lumbar region: Secondary | ICD-10-CM | POA: Diagnosis not present

## 2017-04-28 DIAGNOSIS — M160 Bilateral primary osteoarthritis of hip: Secondary | ICD-10-CM | POA: Diagnosis not present

## 2017-04-28 DIAGNOSIS — M19041 Primary osteoarthritis, right hand: Secondary | ICD-10-CM | POA: Diagnosis not present

## 2017-05-04 DIAGNOSIS — M19042 Primary osteoarthritis, left hand: Secondary | ICD-10-CM | POA: Diagnosis not present

## 2017-05-04 DIAGNOSIS — M160 Bilateral primary osteoarthritis of hip: Secondary | ICD-10-CM | POA: Diagnosis not present

## 2017-05-04 DIAGNOSIS — M19041 Primary osteoarthritis, right hand: Secondary | ICD-10-CM | POA: Diagnosis not present

## 2017-05-04 DIAGNOSIS — M8448XD Pathological fracture, other site, subsequent encounter for fracture with routine healing: Secondary | ICD-10-CM | POA: Diagnosis not present

## 2017-05-04 DIAGNOSIS — I7300 Raynaud's syndrome without gangrene: Secondary | ICD-10-CM | POA: Diagnosis not present

## 2017-05-04 DIAGNOSIS — M9963 Osseous and subluxation stenosis of intervertebral foramina of lumbar region: Secondary | ICD-10-CM | POA: Diagnosis not present

## 2017-05-05 DIAGNOSIS — M8448XD Pathological fracture, other site, subsequent encounter for fracture with routine healing: Secondary | ICD-10-CM | POA: Diagnosis not present

## 2017-05-05 DIAGNOSIS — M19041 Primary osteoarthritis, right hand: Secondary | ICD-10-CM | POA: Diagnosis not present

## 2017-05-05 DIAGNOSIS — M9963 Osseous and subluxation stenosis of intervertebral foramina of lumbar region: Secondary | ICD-10-CM | POA: Diagnosis not present

## 2017-05-05 DIAGNOSIS — M160 Bilateral primary osteoarthritis of hip: Secondary | ICD-10-CM | POA: Diagnosis not present

## 2017-05-05 DIAGNOSIS — M19042 Primary osteoarthritis, left hand: Secondary | ICD-10-CM | POA: Diagnosis not present

## 2017-05-05 DIAGNOSIS — I7300 Raynaud's syndrome without gangrene: Secondary | ICD-10-CM | POA: Diagnosis not present

## 2017-05-09 DIAGNOSIS — E782 Mixed hyperlipidemia: Secondary | ICD-10-CM | POA: Diagnosis not present

## 2017-05-09 DIAGNOSIS — I2510 Atherosclerotic heart disease of native coronary artery without angina pectoris: Secondary | ICD-10-CM | POA: Diagnosis not present

## 2017-05-09 DIAGNOSIS — M48062 Spinal stenosis, lumbar region with neurogenic claudication: Secondary | ICD-10-CM | POA: Diagnosis not present

## 2017-05-09 DIAGNOSIS — M069 Rheumatoid arthritis, unspecified: Secondary | ICD-10-CM | POA: Diagnosis not present

## 2017-05-09 DIAGNOSIS — I739 Peripheral vascular disease, unspecified: Secondary | ICD-10-CM | POA: Diagnosis not present

## 2017-05-09 DIAGNOSIS — M5126 Other intervertebral disc displacement, lumbar region: Secondary | ICD-10-CM | POA: Diagnosis not present

## 2017-05-09 DIAGNOSIS — Z79899 Other long term (current) drug therapy: Secondary | ICD-10-CM | POA: Diagnosis not present

## 2017-05-09 DIAGNOSIS — M8440XA Pathological fracture, unspecified site, initial encounter for fracture: Secondary | ICD-10-CM | POA: Diagnosis not present

## 2017-05-09 DIAGNOSIS — I129 Hypertensive chronic kidney disease with stage 1 through stage 4 chronic kidney disease, or unspecified chronic kidney disease: Secondary | ICD-10-CM | POA: Diagnosis not present

## 2017-05-09 DIAGNOSIS — N183 Chronic kidney disease, stage 3 (moderate): Secondary | ICD-10-CM | POA: Diagnosis not present

## 2017-05-09 DIAGNOSIS — M4856XA Collapsed vertebra, not elsewhere classified, lumbar region, initial encounter for fracture: Secondary | ICD-10-CM | POA: Diagnosis not present

## 2017-05-09 HISTORY — PX: LUMBAR LAMINECTOMY: SHX95

## 2017-05-10 DIAGNOSIS — M8440XA Pathological fracture, unspecified site, initial encounter for fracture: Secondary | ICD-10-CM | POA: Diagnosis not present

## 2017-05-10 DIAGNOSIS — N183 Chronic kidney disease, stage 3 (moderate): Secondary | ICD-10-CM | POA: Diagnosis not present

## 2017-05-10 DIAGNOSIS — M48062 Spinal stenosis, lumbar region with neurogenic claudication: Secondary | ICD-10-CM | POA: Diagnosis not present

## 2017-05-10 DIAGNOSIS — I129 Hypertensive chronic kidney disease with stage 1 through stage 4 chronic kidney disease, or unspecified chronic kidney disease: Secondary | ICD-10-CM | POA: Diagnosis not present

## 2017-05-10 DIAGNOSIS — E782 Mixed hyperlipidemia: Secondary | ICD-10-CM | POA: Diagnosis not present

## 2017-05-10 DIAGNOSIS — I2510 Atherosclerotic heart disease of native coronary artery without angina pectoris: Secondary | ICD-10-CM | POA: Diagnosis not present

## 2017-05-11 DIAGNOSIS — N183 Chronic kidney disease, stage 3 (moderate): Secondary | ICD-10-CM | POA: Diagnosis not present

## 2017-05-11 DIAGNOSIS — M8440XA Pathological fracture, unspecified site, initial encounter for fracture: Secondary | ICD-10-CM | POA: Diagnosis not present

## 2017-05-11 DIAGNOSIS — I129 Hypertensive chronic kidney disease with stage 1 through stage 4 chronic kidney disease, or unspecified chronic kidney disease: Secondary | ICD-10-CM | POA: Diagnosis not present

## 2017-05-11 DIAGNOSIS — E782 Mixed hyperlipidemia: Secondary | ICD-10-CM | POA: Diagnosis not present

## 2017-05-11 DIAGNOSIS — I2510 Atherosclerotic heart disease of native coronary artery without angina pectoris: Secondary | ICD-10-CM | POA: Diagnosis not present

## 2017-05-11 DIAGNOSIS — M48062 Spinal stenosis, lumbar region with neurogenic claudication: Secondary | ICD-10-CM | POA: Diagnosis not present

## 2017-05-14 DIAGNOSIS — M8448XD Pathological fracture, other site, subsequent encounter for fracture with routine healing: Secondary | ICD-10-CM | POA: Diagnosis not present

## 2017-05-14 DIAGNOSIS — M19042 Primary osteoarthritis, left hand: Secondary | ICD-10-CM | POA: Diagnosis not present

## 2017-05-14 DIAGNOSIS — M9963 Osseous and subluxation stenosis of intervertebral foramina of lumbar region: Secondary | ICD-10-CM | POA: Diagnosis not present

## 2017-05-14 DIAGNOSIS — I7300 Raynaud's syndrome without gangrene: Secondary | ICD-10-CM | POA: Diagnosis not present

## 2017-05-14 DIAGNOSIS — M19041 Primary osteoarthritis, right hand: Secondary | ICD-10-CM | POA: Diagnosis not present

## 2017-05-14 DIAGNOSIS — M160 Bilateral primary osteoarthritis of hip: Secondary | ICD-10-CM | POA: Diagnosis not present

## 2017-05-16 ENCOUNTER — Telehealth: Payer: Self-pay

## 2017-05-16 NOTE — Telephone Encounter (Signed)
Pt called back states she is doing well, and that she is very grateful that you thought of her

## 2017-05-18 DIAGNOSIS — M9963 Osseous and subluxation stenosis of intervertebral foramina of lumbar region: Secondary | ICD-10-CM | POA: Diagnosis not present

## 2017-05-18 DIAGNOSIS — M8448XD Pathological fracture, other site, subsequent encounter for fracture with routine healing: Secondary | ICD-10-CM | POA: Diagnosis not present

## 2017-05-18 DIAGNOSIS — I7300 Raynaud's syndrome without gangrene: Secondary | ICD-10-CM | POA: Diagnosis not present

## 2017-05-18 DIAGNOSIS — M19041 Primary osteoarthritis, right hand: Secondary | ICD-10-CM | POA: Diagnosis not present

## 2017-05-18 DIAGNOSIS — M19042 Primary osteoarthritis, left hand: Secondary | ICD-10-CM | POA: Diagnosis not present

## 2017-05-18 DIAGNOSIS — M160 Bilateral primary osteoarthritis of hip: Secondary | ICD-10-CM | POA: Diagnosis not present

## 2017-05-19 DIAGNOSIS — M19042 Primary osteoarthritis, left hand: Secondary | ICD-10-CM | POA: Diagnosis not present

## 2017-05-19 DIAGNOSIS — M160 Bilateral primary osteoarthritis of hip: Secondary | ICD-10-CM | POA: Diagnosis not present

## 2017-05-19 DIAGNOSIS — M9963 Osseous and subluxation stenosis of intervertebral foramina of lumbar region: Secondary | ICD-10-CM | POA: Diagnosis not present

## 2017-05-19 DIAGNOSIS — I7300 Raynaud's syndrome without gangrene: Secondary | ICD-10-CM | POA: Diagnosis not present

## 2017-05-19 DIAGNOSIS — M8448XD Pathological fracture, other site, subsequent encounter for fracture with routine healing: Secondary | ICD-10-CM | POA: Diagnosis not present

## 2017-05-19 DIAGNOSIS — M19041 Primary osteoarthritis, right hand: Secondary | ICD-10-CM | POA: Diagnosis not present

## 2017-05-20 DIAGNOSIS — M19042 Primary osteoarthritis, left hand: Secondary | ICD-10-CM | POA: Diagnosis not present

## 2017-05-20 DIAGNOSIS — I7300 Raynaud's syndrome without gangrene: Secondary | ICD-10-CM | POA: Diagnosis not present

## 2017-05-20 DIAGNOSIS — M8448XD Pathological fracture, other site, subsequent encounter for fracture with routine healing: Secondary | ICD-10-CM | POA: Diagnosis not present

## 2017-05-20 DIAGNOSIS — M160 Bilateral primary osteoarthritis of hip: Secondary | ICD-10-CM | POA: Diagnosis not present

## 2017-05-20 DIAGNOSIS — M19041 Primary osteoarthritis, right hand: Secondary | ICD-10-CM | POA: Diagnosis not present

## 2017-05-20 DIAGNOSIS — M9963 Osseous and subluxation stenosis of intervertebral foramina of lumbar region: Secondary | ICD-10-CM | POA: Diagnosis not present

## 2017-05-25 DIAGNOSIS — M9963 Osseous and subluxation stenosis of intervertebral foramina of lumbar region: Secondary | ICD-10-CM | POA: Diagnosis not present

## 2017-05-25 DIAGNOSIS — M19042 Primary osteoarthritis, left hand: Secondary | ICD-10-CM | POA: Diagnosis not present

## 2017-05-25 DIAGNOSIS — M19041 Primary osteoarthritis, right hand: Secondary | ICD-10-CM | POA: Diagnosis not present

## 2017-05-25 DIAGNOSIS — M160 Bilateral primary osteoarthritis of hip: Secondary | ICD-10-CM | POA: Diagnosis not present

## 2017-05-25 DIAGNOSIS — M8448XD Pathological fracture, other site, subsequent encounter for fracture with routine healing: Secondary | ICD-10-CM | POA: Diagnosis not present

## 2017-05-25 DIAGNOSIS — I7300 Raynaud's syndrome without gangrene: Secondary | ICD-10-CM | POA: Diagnosis not present

## 2017-05-27 DIAGNOSIS — M9963 Osseous and subluxation stenosis of intervertebral foramina of lumbar region: Secondary | ICD-10-CM | POA: Diagnosis not present

## 2017-05-27 DIAGNOSIS — M19041 Primary osteoarthritis, right hand: Secondary | ICD-10-CM | POA: Diagnosis not present

## 2017-05-27 DIAGNOSIS — M8448XD Pathological fracture, other site, subsequent encounter for fracture with routine healing: Secondary | ICD-10-CM | POA: Diagnosis not present

## 2017-05-27 DIAGNOSIS — M160 Bilateral primary osteoarthritis of hip: Secondary | ICD-10-CM | POA: Diagnosis not present

## 2017-05-27 DIAGNOSIS — I7300 Raynaud's syndrome without gangrene: Secondary | ICD-10-CM | POA: Diagnosis not present

## 2017-05-27 DIAGNOSIS — M19042 Primary osteoarthritis, left hand: Secondary | ICD-10-CM | POA: Diagnosis not present

## 2017-05-30 DIAGNOSIS — M19042 Primary osteoarthritis, left hand: Secondary | ICD-10-CM | POA: Diagnosis not present

## 2017-05-30 DIAGNOSIS — M9963 Osseous and subluxation stenosis of intervertebral foramina of lumbar region: Secondary | ICD-10-CM | POA: Diagnosis not present

## 2017-05-30 DIAGNOSIS — I7300 Raynaud's syndrome without gangrene: Secondary | ICD-10-CM | POA: Diagnosis not present

## 2017-05-30 DIAGNOSIS — M160 Bilateral primary osteoarthritis of hip: Secondary | ICD-10-CM | POA: Diagnosis not present

## 2017-05-30 DIAGNOSIS — M8448XD Pathological fracture, other site, subsequent encounter for fracture with routine healing: Secondary | ICD-10-CM | POA: Diagnosis not present

## 2017-05-30 DIAGNOSIS — M19041 Primary osteoarthritis, right hand: Secondary | ICD-10-CM | POA: Diagnosis not present

## 2017-06-01 DIAGNOSIS — M160 Bilateral primary osteoarthritis of hip: Secondary | ICD-10-CM | POA: Diagnosis not present

## 2017-06-01 DIAGNOSIS — M9963 Osseous and subluxation stenosis of intervertebral foramina of lumbar region: Secondary | ICD-10-CM | POA: Diagnosis not present

## 2017-06-01 DIAGNOSIS — M19042 Primary osteoarthritis, left hand: Secondary | ICD-10-CM | POA: Diagnosis not present

## 2017-06-01 DIAGNOSIS — M8448XD Pathological fracture, other site, subsequent encounter for fracture with routine healing: Secondary | ICD-10-CM | POA: Diagnosis not present

## 2017-06-01 DIAGNOSIS — M19041 Primary osteoarthritis, right hand: Secondary | ICD-10-CM | POA: Diagnosis not present

## 2017-06-01 DIAGNOSIS — I7300 Raynaud's syndrome without gangrene: Secondary | ICD-10-CM | POA: Diagnosis not present

## 2017-06-02 DIAGNOSIS — Z9889 Other specified postprocedural states: Secondary | ICD-10-CM | POA: Diagnosis not present

## 2017-06-02 DIAGNOSIS — Z09 Encounter for follow-up examination after completed treatment for conditions other than malignant neoplasm: Secondary | ICD-10-CM | POA: Diagnosis not present

## 2017-06-06 DIAGNOSIS — M9963 Osseous and subluxation stenosis of intervertebral foramina of lumbar region: Secondary | ICD-10-CM | POA: Diagnosis not present

## 2017-06-06 DIAGNOSIS — I7300 Raynaud's syndrome without gangrene: Secondary | ICD-10-CM | POA: Diagnosis not present

## 2017-06-06 DIAGNOSIS — M160 Bilateral primary osteoarthritis of hip: Secondary | ICD-10-CM | POA: Diagnosis not present

## 2017-06-06 DIAGNOSIS — M8448XD Pathological fracture, other site, subsequent encounter for fracture with routine healing: Secondary | ICD-10-CM | POA: Diagnosis not present

## 2017-06-06 DIAGNOSIS — M19042 Primary osteoarthritis, left hand: Secondary | ICD-10-CM | POA: Diagnosis not present

## 2017-06-06 DIAGNOSIS — M19041 Primary osteoarthritis, right hand: Secondary | ICD-10-CM | POA: Diagnosis not present

## 2017-06-08 DIAGNOSIS — I7300 Raynaud's syndrome without gangrene: Secondary | ICD-10-CM | POA: Diagnosis not present

## 2017-06-08 DIAGNOSIS — M19042 Primary osteoarthritis, left hand: Secondary | ICD-10-CM | POA: Diagnosis not present

## 2017-06-08 DIAGNOSIS — M160 Bilateral primary osteoarthritis of hip: Secondary | ICD-10-CM | POA: Diagnosis not present

## 2017-06-08 DIAGNOSIS — M19041 Primary osteoarthritis, right hand: Secondary | ICD-10-CM | POA: Diagnosis not present

## 2017-06-08 DIAGNOSIS — M8448XD Pathological fracture, other site, subsequent encounter for fracture with routine healing: Secondary | ICD-10-CM | POA: Diagnosis not present

## 2017-06-08 DIAGNOSIS — M9963 Osseous and subluxation stenosis of intervertebral foramina of lumbar region: Secondary | ICD-10-CM | POA: Diagnosis not present

## 2017-06-09 DIAGNOSIS — M545 Low back pain: Secondary | ICD-10-CM | POA: Diagnosis not present

## 2017-06-14 DIAGNOSIS — M545 Low back pain: Secondary | ICD-10-CM | POA: Diagnosis not present

## 2017-06-16 DIAGNOSIS — M545 Low back pain: Secondary | ICD-10-CM | POA: Diagnosis not present

## 2017-06-21 DIAGNOSIS — M545 Low back pain: Secondary | ICD-10-CM | POA: Diagnosis not present

## 2017-06-23 DIAGNOSIS — I2510 Atherosclerotic heart disease of native coronary artery without angina pectoris: Secondary | ICD-10-CM | POA: Diagnosis not present

## 2017-06-23 DIAGNOSIS — E782 Mixed hyperlipidemia: Secondary | ICD-10-CM | POA: Diagnosis not present

## 2017-06-23 DIAGNOSIS — M545 Low back pain: Secondary | ICD-10-CM | POA: Diagnosis not present

## 2017-06-23 DIAGNOSIS — I10 Essential (primary) hypertension: Secondary | ICD-10-CM | POA: Diagnosis not present

## 2017-06-28 DIAGNOSIS — M545 Low back pain: Secondary | ICD-10-CM | POA: Diagnosis not present

## 2017-06-30 DIAGNOSIS — M545 Low back pain: Secondary | ICD-10-CM | POA: Diagnosis not present

## 2017-07-05 DIAGNOSIS — M545 Low back pain: Secondary | ICD-10-CM | POA: Diagnosis not present

## 2017-07-07 DIAGNOSIS — M545 Low back pain: Secondary | ICD-10-CM | POA: Diagnosis not present

## 2017-07-13 DIAGNOSIS — M545 Low back pain: Secondary | ICD-10-CM | POA: Diagnosis not present

## 2017-07-14 DIAGNOSIS — Z09 Encounter for follow-up examination after completed treatment for conditions other than malignant neoplasm: Secondary | ICD-10-CM | POA: Diagnosis not present

## 2017-07-14 DIAGNOSIS — Z9889 Other specified postprocedural states: Secondary | ICD-10-CM | POA: Diagnosis not present

## 2017-07-15 DIAGNOSIS — M545 Low back pain: Secondary | ICD-10-CM | POA: Diagnosis not present

## 2017-07-19 ENCOUNTER — Encounter: Payer: Self-pay | Admitting: Family Medicine

## 2017-07-19 ENCOUNTER — Ambulatory Visit (INDEPENDENT_AMBULATORY_CARE_PROVIDER_SITE_OTHER): Payer: Medicare Other | Admitting: Family Medicine

## 2017-07-19 VITALS — BP 122/70 | HR 72 | Temp 98.5°F | Resp 14 | Ht 60.0 in | Wt 102.1 lb

## 2017-07-19 DIAGNOSIS — I83893 Varicose veins of bilateral lower extremities with other complications: Secondary | ICD-10-CM | POA: Diagnosis not present

## 2017-07-19 DIAGNOSIS — M545 Low back pain: Secondary | ICD-10-CM | POA: Diagnosis not present

## 2017-07-19 DIAGNOSIS — I771 Stricture of artery: Secondary | ICD-10-CM

## 2017-07-19 DIAGNOSIS — Z9889 Other specified postprocedural states: Secondary | ICD-10-CM | POA: Diagnosis not present

## 2017-07-19 DIAGNOSIS — I774 Celiac artery compression syndrome: Secondary | ICD-10-CM

## 2017-07-19 NOTE — Progress Notes (Addendum)
Name: [name]   MRN: [id number]    DOB: [birth year]   Date:07/19/2017       Progress Note  Subjective  Chief Complaint  Chief Complaint  Patient presents with  . Referral    to Dr. Lucky Cowboy at [facility]    HPI  Patient presents to discuss referral to vein and vascular. 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 March Rummage, Neurosurgeon with [facility] - still in PT and is doing well with this. Back and leg pain are much improved, but she is having new onset BLE swelling - RLE is slightly worse than the LLE - swelling goes away after elevating at night. Denies shortness of breath or chest pain, no BLE pain or erythema.  She also has significant varicose veins on BLE - worse on the RLE and would like these to be evaluated as well.  Patient Active Problem List   Diagnosis Date Noted  . DNAR (do not attempt resuscitation) 04/19/2017  . Compression fracture of L4 lumbar vertebra ([place]) 12/10/2016  . 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 Abdominal pain 04/09/2016  . Abdominal bloating 04/09/2016  . Ovarian cyst 03/09/2016  . Endometrial thickening on ultra sound 03/09/2016  . Pelvic pain in female 02/29/2016  . Preventative health care 01/03/2016  . Hyperglycemia 12/19/2015  . Subjective change in urination 12/19/2015  . Compression fracture of thoracic spine, non-traumatic ([place]) 08/19/2015  . Chronic thoracic spine pain 08/15/2015  . Low back pain 08/15/2015  . Compression fracture of L2 lumbar vertebra ([place]) 08/15/2015  . Hyperlipidemia   . Osteoporosis   . Rheumatoid arthritis ([place])   . Ovarian lump   . Atherosclerosis of abdominal aorta ([place])   . Lumbar spinal stenosis   . Chronic kidney disease, stage III (moderate)     Social History  Substance Use Topics  . Smoking status: Never Smoker  . Smokeless tobacco: Never Used  . Alcohol use No     Current Outpatient  Prescriptions:  .  Ascorbic Acid (VITAMIN C CR) 500 MG CPCR, Take 500 mg by mouth daily., Disp: , Rfl:  .  Brewers Yeast 487.5 MG TABS, Take 1 tablet by mouth daily. , Disp: , Rfl:  .  Calcium Carbonate-Vitamin D (CALCIUM + D PO), Take 1 tablet by mouth daily. , Disp: , Rfl:  .  Cholecalciferol (VITAMIN D3) 3000 UNITS TABS, Take 1 tablet by mouth daily. , Disp: , Rfl:  .  CINNAMON PO, Take 1 tablet by mouth daily. , Disp: , Rfl:  .  co-enzyme Q-10 30 MG capsule, Take 30 mg by mouth 3 (three) times daily., Disp: , Rfl:  .  Glucosamine Sulfate 500 MG CAPS, Take 500 mg by mouth daily., Disp: , Rfl:  .  ibuprofen (ADVIL,MOTRIN) 200 MG tablet, Take 200 mg by mouth every 6 (six) hours as needed., Disp: , Rfl:  .  Lactobacillus (PROBIOTIC ACIDOPHILUS PO), Take 1 capsule by mouth daily. , Disp: , Rfl:  .  lidocaine (LIDODERM) 5 %, Place 1 patch onto the skin daily. Apply to lower mid back; remove & discard patch within 12 hours; just 1 patch per day, Disp: 30 patch, Rfl: 0 .  MAGNESIUM CARBONATE PO, Take by mouth., Disp: , Rfl:  .  Multiple Vitamin (MULTIVITAMIN) capsule, Take 1 capsule by mouth daily., Disp: , Rfl:  .  Omega-3 Fatty Acids (OMEGA 3 PO), Take 1 tablet  by mouth daily. , Disp: , Rfl:  .  traMADol (ULTRAM) 50 MG tablet, TAKE 1/2 TO 1 (ONE-HALF TO ONE) TABLET BY MOUTH EVERY 6 HOURS AS NEEDED, Disp: 20 tablet, Rfl: 0 .  Turmeric 450 MG CAPS, Take 450 Units by mouth., Disp: , Rfl:  .  vitamin B-12 (CYANOCOBALAMIN) 1000 MCG tablet, Take 1,000 mcg by mouth daily., Disp: , Rfl:   Allergies  Allergen Reactions  . Ibandronic Acid Other (See Comments)    Achy all over. Flu like S/S  . Other Other (See Comments)    Achy all over. Flu like S/S Dysphagia  . Actonel [Risedronate] Other (See Comments)    Dysphagia Dysphagia dysphagia  . Raloxifene Other (See Comments)    Mood swings  . Versed [Midazolam] Other (See Comments)    Difficult waking up  . Doxycycline Rash  . Prednisone Rash     ROS  Constitutional: Negative for fever or weight change.  Respiratory: Negative for cough and shortness of breath.   Cardiovascular: Negative for chest pain or palpitations. +BLE swelling +Varicose veins to BLE Gastrointestinal: Negative for abdominal pain, no bowel changes.  Musculoskeletal: Negative for gait problem or joint swelling.  Skin: Negative for rash.  Neurological: Negative for dizziness or headache.  No other specific complaints in a complete review of systems (except as listed in HPI above).  Objective  Vitals:   07/19/17 1046  BP: 122/70  Pulse: 72  Resp: 14  Temp: 98.5 F (36.9 C)  TempSrc: Oral  SpO2: 96%  Weight: 102 lb 1.6 oz (46.3 kg)  Height: 5' (1.524 m)   Body mass index is 19.94 kg/m.  Nursing Note and Vital Signs reviewed.  Physical Exam  Constitutional: Patient appears well-developed and well-nourished. No distress.  HEENT: head atraumatic, normocephalic Cardiovascular: Normal rate, regular rhythm, S1/S2 present.  No murmur or rub heard. Pulmonary/Chest: Effort normal and breath sounds clear. No respiratory distress or retractions. Peripheral Vascular: Pedal Pulses +2 bilaterally; BLE with significant lower leg varicose veins present - none are erythematous or tender. No calf tenderness, no erythema or heat.  NO BLE EDEMA present at this time. Musculoskeletal: Normal range of motion, no joint effusions. No gross deformities, no bony tenderness. Neurological: she is alert and oriented to person, place, and time. No cranial nerve deficit. Coordination, balance, strength, speech and gait are normal.  Skin: Skin is warm and dry. No rash noted. No erythema.  Psychiatric: Patient has a normal mood and affect. behavior is normal. Judgment and thought content normal.  No results found for this or any previous visit (from the past 2160 hour(s)).   Assessment & Plan  1. Varicose veins of both legs with edema - Ambulatory referral to Vascular  Surgery  2. Celiac artery stenosis ([place]) - Ambulatory referral to Vascular Surgery  3. History of lumbar laminectomy for spinal cord decompression - Ambulatory referral to Vascular Surgery  I have reviewed this encounter including the documentation in this note and/or discussed this patient with the Johney Maine, FNP, NP-C. I am certifying that I agree with the content of this note as supervising physician.  Steele Sizer, MD [facility] 07/23/2017, 8:57 PM

## 2017-07-19 NOTE — Patient Instructions (Signed)
We have made your referral to Dr. Lucky Cowboy. If you do not hear back in 1-2 weeks, please call our office [phone]).

## 2017-07-21 DIAGNOSIS — M545 Low back pain: Secondary | ICD-10-CM | POA: Diagnosis not present

## 2017-07-26 DIAGNOSIS — M545 Low back pain: Secondary | ICD-10-CM | POA: Diagnosis not present

## 2017-07-28 DIAGNOSIS — M545 Low back pain: Secondary | ICD-10-CM | POA: Diagnosis not present

## 2017-08-02 ENCOUNTER — Encounter (INDEPENDENT_AMBULATORY_CARE_PROVIDER_SITE_OTHER): Payer: Self-pay | Admitting: Vascular Surgery

## 2017-08-02 ENCOUNTER — Ambulatory Visit (INDEPENDENT_AMBULATORY_CARE_PROVIDER_SITE_OTHER): Payer: Medicare Other | Admitting: Vascular Surgery

## 2017-08-02 VITALS — BP 144/78 | HR 64 | Resp 17 | Ht 60.0 in | Wt 102.0 lb

## 2017-08-02 DIAGNOSIS — N183 Chronic kidney disease, stage 3 unspecified: Secondary | ICD-10-CM

## 2017-08-02 DIAGNOSIS — R1031 Right lower quadrant pain: Secondary | ICD-10-CM | POA: Diagnosis not present

## 2017-08-02 DIAGNOSIS — I774 Celiac artery compression syndrome: Secondary | ICD-10-CM | POA: Diagnosis not present

## 2017-08-02 DIAGNOSIS — I771 Stricture of artery: Secondary | ICD-10-CM

## 2017-08-02 DIAGNOSIS — E785 Hyperlipidemia, unspecified: Secondary | ICD-10-CM

## 2017-08-02 DIAGNOSIS — M545 Low back pain: Secondary | ICD-10-CM | POA: Diagnosis not present

## 2017-08-02 NOTE — Progress Notes (Signed)
MRN : [id number]  [name] is a 83 y.o. (Sep 25, 1927) female who presents with chief complaint of  Chief Complaint  Patient presents with  . Follow-up    Hx of celiac artery stenosis   .  History of Present Illness: Patient returns today in follow up of celiac artery stenosis and abdominal pain. She reports having had abdominal pain for many years. This bothers her mostly after meals or when she is lying flat. She complains of early satiety and a small amount of weight loss over the past several years. She denies any fever or chills. She complains of reflux and difficulties eating more than small amounts at any time. She does not know how much weight she has lost but says she has gradually gotten smaller and now weighs barely 100 pounds. She says she was diagnosed with celiac artery stenosis earlier this year. I do not see record that we have seen her in the past 7 years. She thinks she may have seen [place] for varicose veins we were at a previous practice prior to 2012. I have independently reviewed a very poor quality CT scan done from February of this year with 5 mm cuts and a poor intravenous bolus. She does have significant aortic atherosclerosis around the visceral vessels but I do not believe he can make much of an interpretation about her mesenteric vessels based off of the study. 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  Current Outpatient Prescriptions  Medication Sig Dispense Refill  . Ascorbic Acid (VITAMIN C CR) 500 MG CPCR Take 500 mg by mouth daily.    Valente David Yeast 487.5 MG TABS Take 1 tablet by mouth daily.     . Calcium Carbonate-Vitamin D (CALCIUM + D PO) Take 1 tablet by mouth daily.     . Cholecalciferol (VITAMIN D3) 3000 UNITS TABS Take 1 tablet by mouth daily.     [name] CINNAMON PO Take 1 tablet by mouth daily.     [name] co-enzyme Q-10 30 MG capsule Take 30 mg by mouth 3 (three) times daily.    . Glucosamine Sulfate 500 MG CAPS  Take 500 mg by mouth daily.    [name] ibuprofen (ADVIL,MOTRIN) 200 MG tablet Take 200 mg by mouth every 6 (six) hours as needed.    . Lactobacillus (PROBIOTIC ACIDOPHILUS PO) Take 1 capsule by mouth daily.     [name] MAGNESIUM CARBONATE PO Take by mouth.    . Multiple Vitamin (MULTIVITAMIN) capsule Take 1 capsule by mouth daily.    . Omega-3 Fatty Acids (OMEGA 3 PO) Take 1 tablet by mouth daily.     . Turmeric 450 MG CAPS Take 450 Units by mouth.    . vitamin B-12 (CYANOCOBALAMIN) 1000 MCG tablet Take 1,000 mcg by mouth daily.    [name] lidocaine (LIDODERM) 5 % Place 1 patch onto the skin daily. Apply to lower mid back; remove & discard patch within 12 hours; just 1 patch per day (Patient not taking: Reported on 08/02/2017) 30 patch 0  . traMADol (ULTRAM) 50 MG tablet TAKE 1/2 TO 1 (ONE-HALF TO ONE) TABLET BY MOUTH EVERY 6 HOURS AS NEEDED (Patient not taking: Reported on 08/02/2017) 20 tablet 0   No current facility-administered medications for this visit.     Past Medical History:  Diagnosis Date  . Arthritis   . Atherosclerosis   . Atherosclerosis of abdominal aorta ([place])   . Chronic kidney disease  stage 2  . CKD (chronic kidney disease) stage 2, GFR 60-89 ml/min   . Collagen vascular disease ([place])   . Diverticulosis 2008  . GERD (gastroesophageal reflux disease)   . Heart murmur   . Hyperlipidemia   . Lumbar spinal stenosis   . Osteoporosis   . Ovarian lump   . RA (rheumatoid arthritis) ([place])   . Raynaud's disease   . Rheumatoid arthritis ([place])   . Spinal stenosis     Past Surgical History:  Procedure Laterality Date  . BREAST SURGERY Left 1965   Benign biopsy  . CHOLECYSTECTOMY  2005   [facility]  . COLONOSCOPY  2008   Dr. Donnella Sham  . EXCISION OF BREAST BIOPSY Left 1965   benign  . EYE SURGERY Bilateral    Cataract Extraction with IOL  . KYPHOPLASTY N/A 12/17/2016   Procedure: KYPHOPLASTY;  Surgeon: Hessie Knows, MD;  Location: [facility] ORS;  Service: Orthopedics;  Laterality: N/A;   l4    Social History Social History  Substance Use Topics  . Smoking status: Never Smoker  . Smokeless tobacco: Never Used  . Alcohol use No    Family History Family History  Problem Relation Age of Onset  . Heart disease Mother   . Hypertension Father   . Heart disease Brother 67       heart attack  . Cancer Brother        bladder  . Diabetes Sister   . Alcohol abuse Sister   . Kidney disease Sister   . Diabetes Brother   . Stroke Brother   . Diabetes Maternal Grandmother   . Heart disease Maternal Grandmother   . Cancer Brother        lung     Allergies  Allergen Reactions  . Ibandronic Acid Other (See Comments)    Achy all over. Flu like S/S  . Other Other (See Comments)    Achy all over. Flu like S/S Dysphagia  . Actonel [Risedronate] Other (See Comments)    Dysphagia Dysphagia dysphagia  . Raloxifene Other (See Comments)    Mood swings  . Versed [Midazolam] Other (See Comments)    Difficult waking up  . Doxycycline Rash  . Prednisone Rash     REVIEW OF SYSTEMS (Negative unless checked)  Constitutional: [x] Weight loss  [] Fever  [] Chills Cardiac: [] Chest pain   [] Chest pressure   [x] Palpitations   [] Shortness of breath when laying flat   [] Shortness of breath at rest   [] Shortness of breath with exertion. Vascular:  [] Pain in legs with walking   [] Pain in legs at rest   [] Pain in legs when laying flat   [] Claudication   [] Pain in feet when walking  [] Pain in feet at rest  [] Pain in feet when laying flat   [] History of DVT   [] Phlebitis   [] Swelling in legs   [x] Varicose veins   [] Non-healing ulcers Pulmonary:   [] Uses home oxygen   [] Productive cough   [] Hemoptysis   [] Wheeze  [] COPD   [] Asthma Neurologic:  [] Dizziness  [] Blackouts   [] Seizures   [] History of stroke   [] History of TIA  [] Aphasia   [] Temporary blindness   [] Dysphagia   [] Weakness or numbness in arms   [] Weakness or numbness in legs Musculoskeletal:  [x] Arthritis   [] Joint swelling    [] Joint pain   [x] Low back pain Hematologic:  [] Easy bruising  [] Easy bleeding   [] Hypercoagulable state   [] Anemic   Gastrointestinal:  [] Blood in stool   [] Vomiting  blood  [x] Gastroesophageal reflux/heartburn   [x] Abdominal pain Genitourinary:  [x] Chronic kidney disease   [] Difficult urination  [] Frequent urination  [] Burning with urination   [] Hematuria Skin:  [] Rashes   [] Ulcers   [] Wounds Psychological:  [x] History of anxiety   []  History of major depression.  Physical Examination  BP (!) 144/78 (BP Location: Right Arm)   Pulse 64   Resp 17   Ht 5' (1.524 m)   Wt 102 lb (46.3 kg)   BMI 19.92 kg/m  Gen:  Thin, appears younger than stated age, NAD Head: [place]/AT, No temporalis wasting. Ear/Nose/Throat: Hearing grossly intact, nares w/o erythema or drainage, trachea midline Eyes: Conjunctiva clear. Sclera non-icteric Neck: Supple.  No JVD.  Pulmonary:  Good air movement, no use of accessory muscles.  Cardiac: RRR, no JVD Vascular: Diffuse varicosities present in bilateral lower extremities Vessel Right Left  Radial Palpable Palpable                                   Gastrointestinal: soft, non-tender/non-distended. No guarding/reflex.  Musculoskeletal: M/S 5/5 throughout.  No deformity or atrophy. No significant lower extremity edema. Neurologic: Sensation grossly intact in extremities.  Symmetrical.  Speech is fluent.  Psychiatric: Judgment intact, Mood & affect appropriate for pt's clinical situation. Dermatologic: No rashes or ulcers noted.  No cellulitis or open wounds.       Labs No results found for this or any previous visit (from the past 2160 hour(s)).  Radiology No results found.   Assessment/Plan  Hyperlipidemia lipid control important in reducing the progression of atherosclerotic disease.    Abdominal pain Unclear etiology but certainly worsening. Given her previous history of mesenteric vascular disease, we're going to reassess this.  Chronic  kidney disease, stage III (moderate) Given the need to limit contrast, we will start with noninvasive studies performed proceed with angiogram.  Celiac artery stenosis ([place]) I have independently reviewed a very poor quality CT scan done from February of this year with 5 mm cuts and a poor intravenous bolus. She does have significant aortic atherosclerosis around the visceral vessels but I do not believe he can make much of an interpretation about her mesenteric vessels based off of the study. 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 Her symptoms are significantly worrisome for chronic visceral ischemia with unintentional weight loss, abdominal pain, and inability to eat large amounts of food. Early satiety is a common problem with celiac artery disease. I believe we should perform a mesenteric duplex for further evaluation before proceeding with any invasive evaluation or therapy. This will be done at her convenience in the near future and I will see her back following the study. We discussed the natural history and pathophysiology of chronic visceral ischemia.    Leotis Pain, MD  08/02/2017 5:19 PM    This note was created with Dragon medical transcription system.  Any errors from dictation are purely unintentional

## 2017-08-02 NOTE — Assessment & Plan Note (Signed)
Given the need to limit contrast, we will start with noninvasive studies performed proceed with angiogram.

## 2017-08-02 NOTE — Assessment & Plan Note (Signed)
I have independently reviewed a very poor quality CT scan done from February of this year with 5 mm cuts and a poor intravenous bolus. She does have significant aortic atherosclerosis around the visceral vessels but I do not believe he can make much of an interpretation about her mesenteric vessels based off of the study. 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 Her symptoms are significantly worrisome for chronic visceral ischemia with unintentional weight loss, abdominal pain, and inability to eat large amounts of food. Early satiety is a common problem with celiac artery disease. I believe we should perform a mesenteric duplex for further evaluation before proceeding with any invasive evaluation or therapy. This will be done at her convenience in the near future and I will see her back following the study. We discussed the natural history and pathophysiology of chronic visceral ischemia.

## 2017-08-02 NOTE — Patient Instructions (Signed)
Chronic Mesenteric Ischemia Mesenteric ischemia is poor blood flow (circulation) in the vessels that supply blood to the stomach, intestines, and liver (mesenteric organs). Chronic mesenteric ischemia, also called mesenteric angina or intestinal angina, is a long-term (chronic) condition. It happens when an artery or vein that provides blood to the mesenteric organs gradually becomes blocked or narrow, restricting the blood supply to the organs. When the blood supply is severely restricted, the mesenteric organs cannot work properly. What are the causes? This condition is commonly caused by fatty deposits that build up in an artery (plaque), which can narrow the artery and restrict blood flow. Other causes include:  Weakened areas in blood vessel walls (aneurysms).  Conditions that cause twisting or inflammation of blood vessels, such as fibromuscular dysplasia or arteritis.  A disorder in which blood clots form in the veins (venous thrombosis).  Scarring and thickening (fibrosis) of blood vessels caused by radiation therapy.  A tear in the aorta, the body's main artery (aortic dissection).  Blood vessel problems after illegal drug use, such as use of cocaine.  Tumors in the nervous system (neurofibromatosis).  Certain autoimmune diseases, such as lupus.  What increases the risk? The following factors may make you more likely to develop this condition:  Being female.  Being over age 50, especially if you have a history of heart problems.  Smoking.  Congestive heart failure.  Irregular heartbeat (arrhythmia).  Having a history of heart attack or stroke.  Diabetes.  High cholesterol.  High blood pressure (hypertension).  Being overweight or obese.  Kidney disease (renal disease) requiring dialysis.  What are the signs or symptoms? Symptoms of this condition include:  Abdomen (abdominal) pain or cramps that develop 15-60 minutes after a meal. This pain may last for 1-3  hours. Some people may develop a fear of eating because of this symptom.  Weight loss.  Diarrhea.  Bloody stool.  Nausea.  Vomiting.  Bloating.  Abdominal pain after stress or with exercise.  How is this diagnosed? This condition is diagnosed based on:  Your medical history.  A physical exam.  Tests, such as: ? Ultrasound. ? CT scan. ? Blood tests. ? Urine tests. ? An imaging test that involves injecting a dye into your arteries to show blood flow through blood vessels (angiogram). This can help to show if there are any blockages in the vessels that lead to the intestines. ? Passing a small probe through the mouth and into the stomach to measure the output of carbon dioxide (gastric tonometry). This can help to indicate whether there is decreased blood flow to the stomach and intestines.  How is this treated? This condition may be treated with:  Dietary changes such as eating smaller, low-fat, meals more frequently.  Lifestyle changes to treat underlying conditions that contribute to the disease, such as high cholesterol and high blood pressure.  Medicines to reduce blood clotting and increase blood flow.  Surgery to remove the blockage, repair arteries or veins, and restore blood flow. This may involve: ? Angioplasty. This is surgery to widen the affected artery, reduce the blockage, and sometimes insert a small, mesh tube (stent). ? Bypass surgery. This may be done to go around (bypass) the blockage and reconnect healthy arteries or veins. ? Placing a stent in the affected area. This may be done to help keep blocked arteries open.  Follow these instructions at home: Eating and drinking  Eat a heart-healthy diet. This includes fresh fruits and vegetables, whole grains, and lean proteins   like chicken, fish, eggs, and beans.  Avoid foods that contain a lot of: ? Salt (sodium). ? Sugar. ? Saturated fat (such as red meat). ? Trans fat (such as fried foods).  Stay  hydrated. Drink enough fluid to keep your urine clear or pale yellow. Lifestyle  Stay active and get regular exercise as told by your health care provider. Aim for 150 minutes of moderate activity or 75 minutes of vigorous activity a week. Ask your health care provider what activities and forms of exercise are safe for you.  Maintain a healthy weight.  Work with your health care provider to manage your cholesterol.  Manage any other health problems you have, such as high blood pressure, diabetes, or heart rhythm problems.  Do not use any products that contain nicotine or tobacco, such as cigarettes and e-cigarettes. If you need help quitting, ask your health care provider. General instructions  Take over-the-counter and prescription medicines only as told by your health care provider.  Keep all follow-up visits as told by your health care provider. This is important. Contact a health care provider if:  Your symptoms do not improve or they return after treatment.  You have a fever. Get help right away if:  You have severe abdominal pain.  You have severe chest pain.  You have shortness of breath.  You feel weak or dizzy.  You have palpitations.  You have numbness or weakness in your face, arm, or leg.  You are confused.  You have trouble speaking or people have trouble understanding what you are saying.  You are constipated.  You have trouble urinating.  You have blood in your stool.  You have severe nausea, vomiting, or persistent diarrhea. Summary  Mesenteric ischemia is poor circulation in the vessels that supply blood to the the stomach, intestines, and liver (mesenteric organs).  This condition happens when an artery or vein that provides blood to the mesenteric organs gradually becomes blocked or narrow, restricting the blood supply to the organs.  This condition is commonly caused by fatty deposits that build up in an artery (plaque), which can narrow the  artery and restrict blood flow.  You are more likely to develop this condition if you are over age 50 and have a history of heart problems, high blood pressure, diabetes, or high cholesterol.  This condition is usually treated with medicines, dietary and lifestyle changes, and surgery to remove the blockage, repair arteries or veins, and restore blood flow. This information is not intended to replace advice given to you by your health care provider. Make sure you discuss any questions you have with your health care provider. Document Released: 06/14/2011 Document Revised: 10/09/2016 Document Reviewed: 10/09/2016 Elsevier Interactive Patient Education  2017 [employer]  

## 2017-08-02 NOTE — Assessment & Plan Note (Signed)
lipid control important in reducing the progression of atherosclerotic disease.   

## 2017-08-02 NOTE — Assessment & Plan Note (Signed)
Unclear etiology but certainly worsening. Given her previous history of mesenteric vascular disease, we're going to reassess this.

## 2017-08-04 DIAGNOSIS — M545 Low back pain: Secondary | ICD-10-CM | POA: Diagnosis not present

## 2017-08-05 ENCOUNTER — Ambulatory Visit (INDEPENDENT_AMBULATORY_CARE_PROVIDER_SITE_OTHER): Payer: Medicare Other

## 2017-08-05 ENCOUNTER — Ambulatory Visit (INDEPENDENT_AMBULATORY_CARE_PROVIDER_SITE_OTHER): Payer: Medicare Other | Admitting: Vascular Surgery

## 2017-08-05 ENCOUNTER — Encounter (INDEPENDENT_AMBULATORY_CARE_PROVIDER_SITE_OTHER): Payer: Self-pay | Admitting: Vascular Surgery

## 2017-08-05 VITALS — BP 161/86 | HR 58 | Resp 16 | Ht 60.0 in | Wt 102.0 lb

## 2017-08-05 DIAGNOSIS — I774 Celiac artery compression syndrome: Secondary | ICD-10-CM

## 2017-08-05 DIAGNOSIS — I771 Stricture of artery: Secondary | ICD-10-CM

## 2017-08-05 DIAGNOSIS — E785 Hyperlipidemia, unspecified: Secondary | ICD-10-CM | POA: Diagnosis not present

## 2017-08-05 DIAGNOSIS — R1031 Right lower quadrant pain: Secondary | ICD-10-CM | POA: Diagnosis not present

## 2017-08-05 NOTE — Progress Notes (Signed)
Subjective:    Patient ID: [name], female    DOB: May 09, 1927, 83 y.o.   MRN: [id number] Chief Complaint  Patient presents with  . Follow-up    Mesenteric ultrasound   Patient presents to review vascular studies. She was last seen in the office on 08/02/2017 with a chief complaint of abdominal pain. Her symptoms remain stable. 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 No significant stenosis in the superior mesenteric arteries. There is no previous duplex for comparison. Patient continues to experience abdominal pain, early satiety and a small amount of weight loss over the past several years. She denies any fever or chills. She complains of reflux and difficulties eating more than small amounts at any time. She does not know how much weight she has lost but says she has gradually gotten smaller and now weighs barely 100 pounds.    Review of Systems  Constitutional: Negative.   HENT: Negative.   Eyes: Negative.   Respiratory: Negative.   Cardiovascular: Negative.   Gastrointestinal: Positive for abdominal pain.  Endocrine: Negative.   Genitourinary: Negative.   Musculoskeletal: Negative.   Skin: Negative.   Allergic/Immunologic: Negative.   Neurological: Negative.   Hematological: Negative.   Psychiatric/Behavioral: Negative.       Objective:   Physical Exam  Constitutional: She is oriented to person, place, and time. She appears well-developed and well-nourished. No distress.  HENT:  Head: Normocephalic and atraumatic.  Eyes: Pupils are equal, round, and reactive to light. Conjunctivae are normal.  Neck: Normal range of motion.  Cardiovascular: Normal rate, regular rhythm, normal heart sounds and intact distal pulses.   Pulses:      Radial pulses are 2+ on the right side, and 2+ on the left side.  Pulmonary/Chest: Effort normal.  Abdominal: Soft. Bowel sounds are normal. She exhibits  no distension and no mass. There is no tenderness. There is no rebound and no guarding.  Musculoskeletal: Normal range of motion. She exhibits no edema.  Neurological: She is alert and oriented to person, place, and time.  Skin: Skin is warm and dry. She is not diaphoretic.  Psychiatric: She has a normal mood and affect. Her behavior is normal. Judgment and thought content normal.  Vitals reviewed.  BP (!) 161/86 (BP Location: Right Arm)   Pulse (!) 58   Resp 16   Ht 5' (1.524 m)   Wt 102 lb (46.3 kg)   BMI 19.92 kg/m   Past Medical History:  Diagnosis Date  . Arthritis   . Atherosclerosis   . Atherosclerosis of abdominal aorta ([place])   . Chronic kidney disease    stage 2  . CKD (chronic kidney disease) stage 2, GFR 60-89 ml/min   . Collagen vascular disease ([place])   . Diverticulosis 2008  . GERD (gastroesophageal reflux disease)   . Heart murmur   . Hyperlipidemia   . Lumbar spinal stenosis   . Osteoporosis   . Ovarian lump   . RA (rheumatoid arthritis) ([place])   . Raynaud's disease   . Rheumatoid arthritis ([place])   . Spinal stenosis     Social History   Social History  . Marital status: Widowed    Spouse name: N/A  . Number of children: N/A  . Years of education: N/A   Occupational History  . Not on file.   Social History Main Topics  . Smoking status: Never Smoker  . Smokeless  tobacco: Never Used  . Alcohol use No  . Drug use: No  . Sexual activity: Not on file   Other Topics Concern  . Not on file   Social History Narrative  . No narrative on file    Past Surgical History:  Procedure Laterality Date  . BREAST SURGERY Left 1965   Benign biopsy  . CHOLECYSTECTOMY  2005   [facility]  . COLONOSCOPY  2008   Dr. Donnella Sham  . EXCISION OF BREAST BIOPSY Left 1965   benign  . EYE SURGERY Bilateral    Cataract Extraction with IOL  . KYPHOPLASTY N/A 12/17/2016   Procedure: KYPHOPLASTY;  Surgeon: Hessie Knows, MD;  Location: [facility] ORS;  Service: Orthopedics;   Laterality: N/A;  l4    Family History  Problem Relation Age of Onset  . Heart disease Mother   . Hypertension Father   . Heart disease Brother 76       heart attack  . Cancer Brother        bladder  . Diabetes Sister   . Alcohol abuse Sister   . Kidney disease Sister   . Diabetes Brother   . Stroke Brother   . Diabetes Maternal Grandmother   . Heart disease Maternal Grandmother   . Cancer Brother        lung    Allergies  Allergen Reactions  . Ibandronic Acid Other (See Comments)    Achy all over. Flu like S/S  . Other Other (See Comments)    Achy all over. Flu like S/S Dysphagia  . Actonel [Risedronate] Other (See Comments)    Dysphagia Dysphagia dysphagia  . Raloxifene Other (See Comments)    Mood swings  . Versed [Midazolam] Other (See Comments)    Difficult waking up  . Doxycycline Rash  . Prednisone Rash       Assessment & Plan:  Patient presents to review vascular studies. She was last seen in the office on 08/02/2017 with a chief complaint of abdominal pain. Her symptoms remain stable. 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 No significant stenosis in the superior mesenteric arteries. There is no previous duplex for comparison. Patient continues to experience abdominal pain, early satiety and a small amount of weight loss over the past several years. She denies any fever or chills. She complains of reflux and difficulties eating more than small amounts at any time. She does not know how much weight she has lost but says she has gradually gotten smaller and now weighs barely 100 pounds.   1. Hyperlipidemia, unspecified hyperlipidemia type - Stable Encouraged good control as its slows the progression of atherosclerotic disease  2. Celiac artery stenosis (HCC) - Stable Patient with chronic abdominal pain, weight loss, early satiety Greater than 70% stenosis on duplex Recommend a  mesenteric angiogram to assess anatomy and if appropriate intervene to restore appropriate blood flow. Procedure, risks and benefits expend to the patient Questions answered Patient would like to speak to family before committing to the procedure. I gave the patient information as to the diagnosis and the procedure. Patient will call the office if she would like to proceed  3. Right lower quadrant abdominal pain - Stable As above  Current Outpatient Prescriptions on File Prior to Visit  Medication Sig Dispense Refill  . Ascorbic Acid (VITAMIN C CR) 500 MG CPCR Take 500 mg by mouth daily.    Valente David Yeast 487.5 MG  TABS Take 1 tablet by mouth daily.     . Calcium Carbonate-Vitamin D (CALCIUM + D PO) Take 1 tablet by mouth daily.     . Cholecalciferol (VITAMIN D3) 3000 UNITS TABS Take 1 tablet by mouth daily.     [name] CINNAMON PO Take 1 tablet by mouth daily.     [name] co-enzyme Q-10 30 MG capsule Take 30 mg by mouth 3 (three) times daily.    . Glucosamine Sulfate 500 MG CAPS Take 500 mg by mouth daily.    [name] ibuprofen (ADVIL,MOTRIN) 200 MG tablet Take 200 mg by mouth every 6 (six) hours as needed.    . Lactobacillus (PROBIOTIC ACIDOPHILUS PO) Take 1 capsule by mouth daily.     [name] lidocaine (LIDODERM) 5 % Place 1 patch onto the skin daily. Apply to lower mid back; remove & discard patch within 12 hours; just 1 patch per day 30 patch 0  . MAGNESIUM CARBONATE PO Take by mouth.    . Multiple Vitamin (MULTIVITAMIN) capsule Take 1 capsule by mouth daily.    . Omega-3 Fatty Acids (OMEGA 3 PO) Take 1 tablet by mouth daily.     . traMADol (ULTRAM) 50 MG tablet TAKE 1/2 TO 1 (ONE-HALF TO ONE) TABLET BY MOUTH EVERY 6 HOURS AS NEEDED 20 tablet 0  . Turmeric 450 MG CAPS Take 450 Units by mouth.    . vitamin B-12 (CYANOCOBALAMIN) 1000 MCG tablet Take 1,000 mcg by mouth daily.     No current facility-administered medications on file prior to visit.     There are no Patient Instructions on file for this  visit. No Follow-up on file.   KIMBERLY A STEGMAYER, PA-C

## 2017-08-08 ENCOUNTER — Telehealth (INDEPENDENT_AMBULATORY_CARE_PROVIDER_SITE_OTHER): Payer: Self-pay

## 2017-08-08 NOTE — Telephone Encounter (Signed)
Called the patient back to give her the requested information. She was pleased because she had forgotten to ask those important questions once she had gotten to the check out window.

## 2017-08-08 NOTE — Telephone Encounter (Signed)
The patient was found to have greater than 70% stenosis of the celiac artery (this is one of the main arteries that feeds the gut) on ultrasound. There is no recovery time for the angiogram. I recommend taking it easy the day of the procedure however she should be back to her normal daily routine the following day. Her results were discussed with the surgeon and he agrees with the plan to move forward with a mesenteric angiogram.

## 2017-08-08 NOTE — Telephone Encounter (Signed)
What is the recovery time for the procedure that she is having?    And what is the percentage of plaque that she has that is causing her to have to have this procedure that was spoken about on last Friday?   [phone]

## 2017-08-09 ENCOUNTER — Encounter (INDEPENDENT_AMBULATORY_CARE_PROVIDER_SITE_OTHER): Payer: Self-pay

## 2017-08-11 ENCOUNTER — Other Ambulatory Visit (INDEPENDENT_AMBULATORY_CARE_PROVIDER_SITE_OTHER): Payer: Self-pay | Admitting: Vascular Surgery

## 2017-08-14 MED ORDER — CEFAZOLIN SODIUM-DEXTROSE 2-4 GM/100ML-% IV SOLN: 2.0000 g | Freq: Once | INTRAVENOUS | Status: DC

## 2017-08-15 ENCOUNTER — Ambulatory Visit
Admission: RE | Admit: 2017-08-15 | Discharge: 2017-08-15 | Disposition: A | Discharge status: Home / Self Care | Payer: Medicare Other | Source: Ambulatory Visit | Attending: Vascular Surgery | Admitting: Vascular Surgery

## 2017-08-15 ENCOUNTER — Encounter
Admission: RE | Disposition: A | Discharge status: Home / Self Care | Payer: Self-pay | Source: Ambulatory Visit | Attending: Vascular Surgery

## 2017-08-15 DIAGNOSIS — Z881 Allergy status to other antibiotic agents status: Secondary | ICD-10-CM | POA: Diagnosis not present

## 2017-08-15 DIAGNOSIS — Z9049 Acquired absence of other specified parts of digestive tract: Secondary | ICD-10-CM | POA: Diagnosis not present

## 2017-08-15 DIAGNOSIS — Z9841 Cataract extraction status, right eye: Secondary | ICD-10-CM | POA: Diagnosis not present

## 2017-08-15 DIAGNOSIS — Z9889 Other specified postprocedural states: Secondary | ICD-10-CM | POA: Diagnosis not present

## 2017-08-15 DIAGNOSIS — Z888 Allergy status to other drugs, medicaments and biological substances status: Secondary | ICD-10-CM | POA: Insufficient documentation

## 2017-08-15 DIAGNOSIS — E785 Hyperlipidemia, unspecified: Secondary | ICD-10-CM | POA: Diagnosis not present

## 2017-08-15 DIAGNOSIS — I700 Atherosclerosis of aorta: Secondary | ICD-10-CM | POA: Insufficient documentation

## 2017-08-15 DIAGNOSIS — Z8052 Family history of malignant neoplasm of bladder: Secondary | ICD-10-CM | POA: Diagnosis not present

## 2017-08-15 DIAGNOSIS — K551 Chronic vascular disorders of intestine: Secondary | ICD-10-CM | POA: Diagnosis not present

## 2017-08-15 DIAGNOSIS — M810 Age-related osteoporosis without current pathological fracture: Secondary | ICD-10-CM | POA: Insufficient documentation

## 2017-08-15 DIAGNOSIS — Z801 Family history of malignant neoplasm of trachea, bronchus and lung: Secondary | ICD-10-CM | POA: Insufficient documentation

## 2017-08-15 DIAGNOSIS — M069 Rheumatoid arthritis, unspecified: Secondary | ICD-10-CM | POA: Insufficient documentation

## 2017-08-15 DIAGNOSIS — I774 Celiac artery compression syndrome: Secondary | ICD-10-CM | POA: Diagnosis not present

## 2017-08-15 DIAGNOSIS — Z8249 Family history of ischemic heart disease and other diseases of the circulatory system: Secondary | ICD-10-CM | POA: Insufficient documentation

## 2017-08-15 DIAGNOSIS — Z841 Family history of disorders of kidney and ureter: Secondary | ICD-10-CM | POA: Insufficient documentation

## 2017-08-15 DIAGNOSIS — Z9109 Other allergy status, other than to drugs and biological substances: Secondary | ICD-10-CM | POA: Insufficient documentation

## 2017-08-15 DIAGNOSIS — N182 Chronic kidney disease, stage 2 (mild): Secondary | ICD-10-CM | POA: Diagnosis not present

## 2017-08-15 DIAGNOSIS — I7300 Raynaud's syndrome without gangrene: Secondary | ICD-10-CM | POA: Diagnosis not present

## 2017-08-15 DIAGNOSIS — Z823 Family history of stroke: Secondary | ICD-10-CM | POA: Insufficient documentation

## 2017-08-15 DIAGNOSIS — Z9842 Cataract extraction status, left eye: Secondary | ICD-10-CM | POA: Diagnosis not present

## 2017-08-15 DIAGNOSIS — Z833 Family history of diabetes mellitus: Secondary | ICD-10-CM | POA: Diagnosis not present

## 2017-08-15 DIAGNOSIS — Z811 Family history of alcohol abuse and dependence: Secondary | ICD-10-CM | POA: Insufficient documentation

## 2017-08-15 DIAGNOSIS — I2510 Atherosclerotic heart disease of native coronary artery without angina pectoris: Secondary | ICD-10-CM | POA: Insufficient documentation

## 2017-08-15 HISTORY — PX: VISCERAL ARTERY INTERVENTION: CATH118277

## 2017-08-15 HISTORY — PX: VISCERAL ANGIOGRAPHY: CATH118276

## 2017-08-15 SURGERY — VISCERAL ANGIOGRAPHY
Anesthesia: Moderate Sedation

## 2017-08-15 MED ORDER — HEPARIN (PORCINE) IN NACL 2-0.9 UNIT/ML-% IJ SOLN
INTRAMUSCULAR | Status: AC
  Filled 2017-08-15: qty 1000

## 2017-08-15 MED ORDER — METHYLPREDNISOLONE SODIUM SUCC 125 MG IJ SOLR: 125.0000 mg | INTRAMUSCULAR | Status: DC | PRN

## 2017-08-15 MED ORDER — FENTANYL CITRATE (PF) 100 MCG/2ML IJ SOLN
INTRAMUSCULAR | Status: DC | PRN
  Administered 2017-08-15: 50 ug via INTRAVENOUS
  Administered 2017-08-15: 25 ug via INTRAVENOUS

## 2017-08-15 MED ORDER — CEFAZOLIN SODIUM-DEXTROSE 1-4 GM/50ML-% IV SOLN
1.0000 g | Freq: Once | INTRAVENOUS | Status: AC
  Administered 2017-08-15: 1 g via INTRAVENOUS

## 2017-08-15 MED ORDER — MIDAZOLAM HCL 2 MG/2ML IJ SOLN
INTRAMUSCULAR | Status: DC | PRN
  Administered 2017-08-15 (×2): 1 mg via INTRAVENOUS

## 2017-08-15 MED ORDER — SODIUM CHLORIDE 0.9 % IV SOLN
INTRAVENOUS | Status: DC
  Administered 2017-08-15: 10:00:00 via INTRAVENOUS

## 2017-08-15 MED ORDER — HEPARIN SODIUM (PORCINE) 1000 UNIT/ML IJ SOLN
INTRAMUSCULAR | Status: AC
  Filled 2017-08-15: qty 1

## 2017-08-15 MED ORDER — FENTANYL CITRATE (PF) 100 MCG/2ML IJ SOLN
INTRAMUSCULAR | Status: AC
  Filled 2017-08-15: qty 2

## 2017-08-15 MED ORDER — IOPAMIDOL (ISOVUE-300) INJECTION 61%
INTRAVENOUS | Status: DC | PRN
  Administered 2017-08-15: 60 mL via INTRA_ARTERIAL

## 2017-08-15 MED ORDER — HEPARIN SODIUM (PORCINE) 1000 UNIT/ML IJ SOLN
INTRAMUSCULAR | Status: DC | PRN
  Administered 2017-08-15: 3000 [IU] via INTRAVENOUS
  Administered 2017-08-15: 1000 [IU] via INTRAVENOUS

## 2017-08-15 MED ORDER — MIDAZOLAM HCL 5 MG/5ML IJ SOLN
INTRAMUSCULAR | Status: AC
  Filled 2017-08-15: qty 5

## 2017-08-15 MED ORDER — HYDROMORPHONE HCL 1 MG/ML IJ SOLN: 1.0000 mg | Freq: Once | INTRAMUSCULAR | Status: DC | PRN

## 2017-08-15 MED ORDER — ONDANSETRON HCL 4 MG/2ML IJ SOLN
4.0000 mg | Freq: Four times a day (QID) | INTRAMUSCULAR | Status: DC | PRN
  Administered 2017-08-15: 4 mg via INTRAVENOUS

## 2017-08-15 MED ORDER — FAMOTIDINE 20 MG PO TABS: 40.0000 mg | ORAL_TABLET | ORAL | Status: DC | PRN

## 2017-08-15 MED ORDER — ASPIRIN EC 81 MG PO TBEC: 81.0000 mg | DELAYED_RELEASE_TABLET | Freq: Every day | ORAL | 2 refills | Status: DC

## 2017-08-15 MED ORDER — ATORVASTATIN CALCIUM 10 MG PO TABS: 10.0000 mg | ORAL_TABLET | Freq: Every day | ORAL | 11 refills | Status: DC

## 2017-08-15 MED ORDER — LIDOCAINE-EPINEPHRINE (PF) 1 %-1:200000 IJ SOLN
INTRAMUSCULAR | Status: AC
  Filled 2017-08-15: qty 30

## 2017-08-15 MED ORDER — ONDANSETRON HCL 4 MG/2ML IJ SOLN
INTRAMUSCULAR | Status: AC
  Filled 2017-08-15: qty 2

## 2017-08-15 SURGICAL SUPPLY — 25 items
BALLN ULTRV 018 5X40X75 (BALLOONS) ×2
BALLN ULTRV 018 6X60X75 (BALLOONS) ×2
BALLN VIATRAC 6X20X135 (BALLOONS) ×2
BALLOON ULTRV 018 5X40X75 (BALLOONS) ×1 IMPLANT
BALLOON ULTRV 018 6X60X75 (BALLOONS) ×1 IMPLANT
BALLOON VIATRAC 6X20X135 (BALLOONS) ×1 IMPLANT
CATH LIMA 6F (CATHETERS) ×2 IMPLANT
CATH PIG 70CM (CATHETERS) ×2 IMPLANT
CATH VS15FR (CATHETERS) ×2 IMPLANT
DEVICE PRESTO INFLATION (MISCELLANEOUS) ×2 IMPLANT
DEVICE STARCLOSE SE CLOSURE (Vascular Products) ×2 IMPLANT
DEVICE TORQUE (MISCELLANEOUS) ×2 IMPLANT
GLIDECATH 4FR STR (CATHETERS) ×2 IMPLANT
GLIDEWIRE ADV .014X300CM (WIRE) ×2 IMPLANT
GLIDEWIRE STIFF .35X180X3 HYDR (WIRE) ×2 IMPLANT
GUIDEWIRE ADV .018X180CM (WIRE) ×2 IMPLANT
PACK ANGIOGRAPHY (CUSTOM PROCEDURE TRAY) ×2 IMPLANT
SHEATH ANL 5FRX45 (SHEATH) ×2 IMPLANT
SHEATH BRITE TIP 5FRX11 (SHEATH) ×2 IMPLANT
SHEATH BRITE TIP 6FRX11 (SHEATH) ×2 IMPLANT
SYR MEDRAD MARK V 150ML (SYRINGE) ×2 IMPLANT
TUBING CONTRAST HIGH PRESS 72 (TUBING) ×2 IMPLANT
VALVE HEMO TOUHY BORST Y (VALVE) ×2 IMPLANT
WIRE J 3MM .035X145CM (WIRE) ×2 IMPLANT
WIRE MAGIC TOR.035 180C (WIRE) ×2 IMPLANT

## 2017-08-15 NOTE — H&P (Signed)
[facility] History & Physical Update  The patient was interviewed and re-examined.  The patient's previous History and Physical has been reviewed and is unchanged.  There is no change in the plan of care. We plan to proceed with the scheduled procedure.  Leotis Pain, MD  08/15/2017, 9:46 AM

## 2017-08-15 NOTE — Op Note (Signed)
[facility] Percutaneous Study/Intervention Procedural Note   Date: 08/15/2017  Surgeon(s): Leotis Pain, MD  Assistants: none  Pre-operative Diagnosis: 1.  Chronic Mesenteric ischemia 2. Celiac artery stenosis   Post-operative diagnosis: Same  Procedure(s) Performed: 1. Ultrasound guidance for vascular access right femoral artery  2. Catheter placement into SMA and celiac artery from right femoral approach 3. Aortogram and selective angiogram of the celiac and superior mesenteric arteries 4. PTA of the celiac artery with 5 mm diameter by 4 cm length angioplasty balloon 5. StarClose closure device right femoral artery  Contrast: 60  Fluoro time: 17.5 minutes  EBL: 5 cc  Anesthesia: Approximately 60 minutes of Moderate conscious sedation using 2 mg of Versed and 75 mcg of Fentanyl  Indications: Patient is a 83 y.o. female who has symptoms consistent with mesenteric ischemia. The patient has a duplex showing markedly elevated velocities in the celiac artery. Her symptoms are largely postprandial abdominal pain, nausea, weight loss, and early satiety. The patient is brought in for angiography for further evaluation and potential treatment. Risks and benefits are discussed and informed consent is obtained  Procedure: The patient was identified and appropriate procedural time out was performed. The patient was then placed supine on the table and prepped and draped in the usual sterile fashion. Moderate conscious sedation was administered during a face to face encounter with the patient throughout the procedure with my supervision of the RN administering medicines and monitoring the patient's vital signs, pulse oximetry, telemetry and mental status throughout from the start of the procedure until the patient was taken to the recovery room. Ultrasound was used to evaluate the right common  femoral artery. It was patent . A digital ultrasound image was acquired. A Seldinger needle was used to access the right common femoral artery under direct ultrasound guidance and a permanent image was performed. A 0.035 J wire was advanced without resistance and a 5Fr sheath was placed. Pigtail catheter was placed into the aorta and an AP aortogram was performed. This demonstrated what appeared to be reasonably normal renal arteries with an aorta and iliac arteries with mild atherosclerosis but no hemodynamically significant stenosis. We transitioned to the lateral projection to image the celiac and SMA. The lateral image demonstrated calcific lesions at the origins of both visceral vessels although on the initial imaging the superior mesenteric artery seemed to have good flow and there appeared to be stenosis of the celiac artery.Both vessels had quite steep angles which were downgoing or noticeable off the aortogram. The patient was given 4000 units of IV heparin.We upsized to a 6 Fr sheath.  A VS 1 catheter was used to selectively cannulate the SMA first. Selective imaging was performed with Spearman mesenteric artery which demonstrated very mild stenosis proximally of less than 20% with brisk flow and no limitation. I then selectively cannulated the celiac artery with a VS 1 catheter which was somewhat more difficult. Selective imaging of the celiac artery was then performed. This demonstrated a high-grade near occlusive stenosis of the celiac artery. Based on her symptoms and these findings, I elected to try to treat the celiac artery to try to improve the patient's clinical course. I crossed the lesion with some difficulty with a 0.018 advantage wire and the stiff angle Glidewire would not cross the lesion. Even with extensive wire purchase well out the celiac artery branches, I could not get VS 1 catheter to cross the highly calcific stenotic lesion. I tried to exchange for a glide  catheter but this  would not cross either. I then used a 5 mm diameter x 4 mm length angioplasty balloon to perform percutaneous transluminal angioplasty of the celiac artery. I inflated the balloon to 14 atm and held the inflation for 1 minute. On completion angiogram following this angioplasty, 80% % residual stenosis was identified. Despite this angioplasty, I still could not get a catheter to cross the celiac artery and gain a good purchase for stent placement from the femoral orientation. This was in large part due to the steep angle of the celiac artery off of the aorta. I tried to use a 0.014 advantage wire as well as an 035 wire but could never gained catheter purchase across the lesion. Attempts at using a larger balloon also resulted in loss of wire purchase with the balloon not crossing the lesion. I exchanged for a IM guide catheter and tried to telescope the VS 1 catheter through this. Again, I could get the 0.014 or 0.018 advantage wire to cross the lesion and gain extensive wire purchase without difficulty, but was never able to gain catheter or sheath access in the celiac origin that would allow stent placement. After nearly 20 minutes of fluoroscopy multiple attempts it was clear this was not going to be successful from the femoral approach, and consideration for an arm access would have to be given if she continues to be symptomatic. At this point, I elected to terminate the procedure. The diagnostic catheter was removed. StarClose closure device was deployed in usual fashion with excellent hemostatic result. The patient was taken to the recovery room in stable condition having tolerated the procedure well.     Findings:Aorta patent without significant stenosis. Superior mesenteric artery with minimal stenosis. Celiac artery with high-grade near occlusive stenosis. Both the celiac artery and superior mesenteric artery were very steep and her angulation off of the aorta  Disposition: Patient was taken to the  recovery room in stable condition having tolerated the procedure well.  Complications: None  Leotis Pain 08/15/2017 11:42 AM   This note was created with Dragon Medical transcription system. Any errors in dictation are purely unintentional.

## 2017-08-15 NOTE — Progress Notes (Signed)
Dr. Lucky Cowboy at bedside speaking with pt. And her daughter re: procedure done.

## 2017-08-15 NOTE — Progress Notes (Signed)
Pt. C/o nausea; had mild approx. 50-100 ml greenish colored emesis. Pt. Med. Now with zofran 4 mg slow IVP. One time event. In NAD. Pt. States " I seem to get sick every time I have a procedure.

## 2017-08-16 ENCOUNTER — Encounter: Payer: Self-pay | Admitting: Vascular Surgery

## 2017-08-25 DIAGNOSIS — Z23 Encounter for immunization: Secondary | ICD-10-CM | POA: Diagnosis not present

## 2017-08-30 ENCOUNTER — Encounter (INDEPENDENT_AMBULATORY_CARE_PROVIDER_SITE_OTHER): Payer: Self-pay | Admitting: Vascular Surgery

## 2017-08-30 ENCOUNTER — Telehealth: Payer: Self-pay

## 2017-08-30 ENCOUNTER — Telehealth: Payer: Self-pay | Admitting: Family Medicine

## 2017-08-30 ENCOUNTER — Ambulatory Visit (INDEPENDENT_AMBULATORY_CARE_PROVIDER_SITE_OTHER): Payer: Medicare Other | Admitting: Vascular Surgery

## 2017-08-30 ENCOUNTER — Telehealth (INDEPENDENT_AMBULATORY_CARE_PROVIDER_SITE_OTHER): Payer: Self-pay | Admitting: Vascular Surgery

## 2017-08-30 VITALS — BP 190/96 | HR 56 | Resp 16 | Ht 62.0 in | Wt 104.0 lb

## 2017-08-30 DIAGNOSIS — I774 Celiac artery compression syndrome: Secondary | ICD-10-CM | POA: Diagnosis not present

## 2017-08-30 DIAGNOSIS — R739 Hyperglycemia, unspecified: Secondary | ICD-10-CM | POA: Diagnosis not present

## 2017-08-30 DIAGNOSIS — I771 Stricture of artery: Secondary | ICD-10-CM

## 2017-08-30 DIAGNOSIS — E785 Hyperlipidemia, unspecified: Secondary | ICD-10-CM

## 2017-08-30 NOTE — Telephone Encounter (Signed)
Spoke with Dr. Lucky Cowboy. A second attempt at revascularization of the mesenteric arteries (specifically the celiac) could be achieved by using the left brachial artery. The patient would be placed under anesthesia for this. Discussed planning it after Christmas. Dr. Lucky Cowboy is okay with this. As we discussed during our visit, patient to monitor her blood pressure - if it continues to be elevated she should contact Dr. Delight Ovens office. This was left as a message.

## 2017-08-30 NOTE — Telephone Encounter (Signed)
Copied from [place] #[id number]. Topic: Quick Communication - Other Results >> Aug 30, 2017  2:40 PM [name] I, [place] wrote:  Pt call concern her Bp and has 130/80

## 2017-08-30 NOTE — Telephone Encounter (Signed)
-----   Message from Sela Hua, PA-C sent at 08/30/2017 12:49 PM EDT ----- Regarding: RE: Particia Nearing. Did you address her BP? Our blood pressure cuff tends to run high. She was instructed to call your office if she noticed a continued trend.  ----- Message ----- From: Arnetha Courser, MD Sent: 08/30/2017  12:37 PM To: Sela Hua, PA-C Subject: Greetings. Did you address her BP?             Hi! Thank you for seeing our mutual patient. I see her BP was high, but I did not see any mention in the A/P. Did you adjust her medicine or instruct her to f/u with me? Thank you!  ----- Message ----- From: Sela Hua, PA-C Sent: 08/30/2017  12:28 PM To: Arnetha Courser, MD

## 2017-08-30 NOTE — Telephone Encounter (Signed)
Patient notified, states she feels great bp when she checks at home usually runs in the 140's/70-80's

## 2017-08-30 NOTE — Telephone Encounter (Signed)
Copied from [place] #[id number]. Topic: Quick Communication - Other Results >> Aug 30, 2017  2:40 PM [name] I, [place] wrote:  Pt call concern her Bp and has 130/80

## 2017-08-30 NOTE — Progress Notes (Signed)
Subjective:    Patient ID: [name], female    DOB: 13-Nov-1926, 83 y.o.   MRN: [id number] Chief Complaint  Patient presents with  . Follow-up    2 wk f/u   Patient presents for her first post procedure follow-up. The patient underwent a mesenteric angiogram on 08/15/2017. At that time, the patient's celiac artery was angioplastied. As per the operative note, there still remains and 80% narrowing in the celiac artery. Patient presents today with daughter. Patient states minimal improvement in her general abdominal pain. States that she does not necessarily experience post prandial pain, nausea or vomiting however her stomach feels "fuzzy" after she eats. Patient states her abdominal discomfort is worse at night. Patient and daughter inform that Dr. Lucky Cowboy would like her to undergo another mesenteric angiogram with access from the upper extremity. The patient denies any changes in her bowel habits. Patient denies any fever, nausea or vomiting.   Review of Systems  Constitutional: Negative.   HENT: Negative.   Eyes: Negative.   Respiratory: Negative.   Cardiovascular: Negative.   Gastrointestinal: Positive for abdominal pain.  Endocrine: Negative.   Genitourinary: Negative.   Musculoskeletal: Negative.   Skin: Negative.   Allergic/Immunologic: Negative.   Neurological: Negative.   Hematological: Negative.   Psychiatric/Behavioral: Negative.       Objective:   Physical Exam  Constitutional: She is oriented to person, place, and time. She appears well-developed and well-nourished. No distress.  HENT:  Head: Normocephalic and atraumatic.  Eyes: Pupils are equal, round, and reactive to light. Conjunctivae are normal.  Neck: Normal range of motion.  Cardiovascular: Normal rate, regular rhythm, normal heart sounds and intact distal pulses.   Pulses:      Radial pulses are 2+ on the right side, and 2+ on the left side.       Dorsalis pedis pulses are 1+ on the right side, and  1+ on the left side.       Posterior tibial pulses are 1+ on the right side, and 1+ on the left side.  Pulmonary/Chest: Effort normal.  Abdominal: Soft. Bowel sounds are normal. She exhibits no distension. There is no tenderness. There is no rebound.  Musculoskeletal: Normal range of motion. She exhibits no edema.  Neurological: She is alert and oriented to person, place, and time.  Skin: Skin is warm and dry. She is not diaphoretic.  Psychiatric: She has a normal mood and affect. Her behavior is normal. Judgment and thought content normal.  Vitals reviewed.  BP (!) 190/96   Pulse (!) 56   Resp 16   Ht 5\' 2"  (1.575 m)   Wt 104 lb (47.2 kg)   BMI 19.02 kg/m   Past Medical History:  Diagnosis Date  . Arthritis   . Atherosclerosis   . Atherosclerosis of abdominal aorta ([place])   . Chronic kidney disease    stage 2  . CKD (chronic kidney disease) stage 2, GFR 60-89 ml/min   . Collagen vascular disease ([place])   . Diverticulosis 2008  . GERD (gastroesophageal reflux disease)   . Heart murmur   . Hyperlipidemia   . Lumbar spinal stenosis   . Osteoporosis   . Ovarian lump   . RA (rheumatoid arthritis) ([place])   . Raynaud's disease   . Rheumatoid arthritis ([place])   . Spinal stenosis    Social History   Social History  . Marital status: Widowed    Spouse name: N/A  . Number of children: N/A  .  Years of education: N/A   Occupational History  . Not on file.   Social History Main Topics  . Smoking status: Never Smoker  . Smokeless tobacco: Never Used  . Alcohol use No  . Drug use: No  . Sexual activity: Not on file   Other Topics Concern  . Not on file   Social History Narrative  . No narrative on file   Past Surgical History:  Procedure Laterality Date  . BREAST SURGERY Left 1965   Benign biopsy  . CHOLECYSTECTOMY  2005   [place]  . COLONOSCOPY  2008   Dr. Donnella Sham  . EXCISION OF BREAST BIOPSY Left 1965   benign  . EYE SURGERY Bilateral    Cataract Extraction  with IOL  . KYPHOPLASTY N/A 12/17/2016   Procedure: KYPHOPLASTY;  Surgeon: Hessie Knows, MD;  Location: [facility];  Service: Orthopedics;  Laterality: N/A;  l4  . VISCERAL ANGIOGRAPHY N/A 08/15/2017   Procedure: VISCERAL ANGIOGRAPHY;  Surgeon: Algernon Huxley, MD;  Location: [facility];  Service: Cardiovascular;  Laterality: N/A;  . VISCERAL ARTERY INTERVENTION N/A 08/15/2017   Procedure: VISCERAL ARTERY INTERVENTION;  Surgeon: Algernon Huxley, MD;  Location: [facility];  Service: Cardiovascular;  Laterality: N/A;   Family History  Problem Relation Age of Onset  . Heart disease Mother   . Hypertension Father   . Heart disease Brother 67       heart attack  . Cancer Brother        bladder  . Diabetes Sister   . Alcohol abuse Sister   . Kidney disease Sister   . Diabetes Brother   . Stroke Brother   . Diabetes Maternal Grandmother   . Heart disease Maternal Grandmother   . Cancer Brother        lung   Allergies  Allergen Reactions  . Ibandronic Acid Other (See Comments)    Achy all over. Flu like S/S  . Other Other (See Comments)    Achy all over. Flu like S/S Dysphagia  . Actonel [Risedronate] Other (See Comments)    Dysphagia  . Raloxifene Other (See Comments)    Mood swings  . Versed [Midazolam] Other (See Comments)    Difficult waking up  . Doxycycline Rash  . Prednisone Rash      Assessment & Plan:  Patient presents for her first post procedure follow-up. The patient underwent a mesenteric angiogram on 08/15/2017. At that time, the patient's celiac artery was angioplastied. As per the operative note, there still remains and 80% narrowing in the celiac artery. Patient presents today with daughter. Patient states minimal improvement in her general abdominal pain. States that she does not necessarily experience post prandial pain, nausea or vomiting however her stomach feels "fuzzy" after she eats. Patient states her abdominal discomfort is worse at night. Patient  and daughter inform that Dr. Lucky Cowboy would like her to undergo another mesenteric angiogram with access from the upper extremity. The patient denies any changes in her bowel habits. Patient denies any fever, nausea or vomiting.  1. Celiac artery stenosis (HCC) - stable Patient presents today with minimal improvement to her abdominal discomfort. Patient underwent a mesenteric angiogram and was found to have almost an occluded celiac artery. 80% stenosis remaining status post angiogram. Patient and daughter and form that Dr. Lucky Cowboy would like to repeat procedure with access possibly from upper extremity. I will speak to him about this and call that the patient. Patient would also like  to wait until after the holidays particularly after Christmas I will also ask him this.  2. Hyperlipidemia, unspecified hyperlipidemia type - Stable Encouraged good control as its slows the progression of atherosclerotic disease  3. Hyperglycemia - Stable Encouraged good control as its slows the progression of atherosclerotic disease  Current Outpatient Prescriptions on File Prior to Visit  Medication Sig Dispense Refill  . acetaminophen (TYLENOL) 500 MG tablet Take 500 mg by mouth every 6 (six) hours as needed for mild pain.    . Ascorbic Acid (VITAMIN C CR) 500 MG CPCR Take 500 mg by mouth daily.    [name] aspirin EC 81 MG tablet Take 1 tablet (81 mg total) by mouth daily. 150 tablet 2  . atorvastatin (LIPITOR) 10 MG tablet Take 1 tablet (10 mg total) by mouth daily. 30 tablet 11  . Brewers Yeast 487.5 MG TABS Take 1 tablet by mouth daily.     . Calcium Carbonate-Vitamin D (CALCIUM + D PO) Take 1 tablet by mouth daily.     . Cholecalciferol (VITAMIN D3) 3000 UNITS TABS Take 1 tablet by mouth daily.     [name] CINNAMON PO Take 1 tablet by mouth daily.     [name] co-enzyme Q-10 30 MG capsule Take 30 mg by mouth daily.     . Glucosamine Sulfate 500 MG CAPS Take 500 mg by mouth daily.    [name] ibuprofen (ADVIL,MOTRIN) 200 MG tablet Take  200 mg by mouth every 6 (six) hours as needed (achiness).     [name] ketotifen (ZADITOR) 0.025 % ophthalmic solution Place 1 drop into both eyes 2 (two) times daily as needed (dryness).    . Lactobacillus (PROBIOTIC ACIDOPHILUS PO) Take 1 capsule by mouth daily.     [name] loratadine (CLARITIN) 10 MG tablet Take 10 mg by mouth daily as needed for allergies.    . Magnesium 400 MG CAPS Take 400 mg by mouth daily.    . Multiple Vitamin (MULTIVITAMIN) capsule Take 1 capsule by mouth daily.    [name] neomycin-bacitracin-polymyxin (NEOSPORIN) [phone] ointment Apply topically daily as needed (itching).    . Omega-3 Fatty Acids (OMEGA 3 PO) Take 1 tablet by mouth daily.     . sodium chloride (OCEAN) 0.65 % SOLN nasal spray Place 1 spray into both nostrils every 4 (four) hours as needed for congestion.     . Turmeric 450 MG CAPS Take 450 Units by mouth.    . vitamin B-12 (CYANOCOBALAMIN) 1000 MCG tablet Take 1,000 mcg by mouth daily.     No current facility-administered medications on file prior to visit.     There are no Patient Instructions on file for this visit. No Follow-up on file.   KIMBERLY A STEGMAYER, PA-C

## 2017-08-30 NOTE — Telephone Encounter (Signed)
Glad, thank you so much

## 2017-08-30 NOTE — Telephone Encounter (Signed)
Please contact patient Let her know that I noticed her high blood pressure at the specialist's office Does she have a way to check her BP at home? What has it been running? Please get a feel for how her pressure has been. I'd like to make sure she is controlled and adjust medicine if needed Thank you

## 2017-09-13 ENCOUNTER — Encounter (INDEPENDENT_AMBULATORY_CARE_PROVIDER_SITE_OTHER): Payer: Self-pay

## 2017-09-13 ENCOUNTER — Encounter (INDEPENDENT_AMBULATORY_CARE_PROVIDER_SITE_OTHER): Payer: Self-pay | Admitting: Vascular Surgery

## 2017-09-13 ENCOUNTER — Other Ambulatory Visit (INDEPENDENT_AMBULATORY_CARE_PROVIDER_SITE_OTHER): Payer: Self-pay | Admitting: Vascular Surgery

## 2017-09-13 ENCOUNTER — Ambulatory Visit (INDEPENDENT_AMBULATORY_CARE_PROVIDER_SITE_OTHER): Payer: Medicare Other | Admitting: Vascular Surgery

## 2017-09-13 VITALS — BP 158/81 | HR 69 | Resp 16 | Ht 60.0 in | Wt 105.4 lb

## 2017-09-13 DIAGNOSIS — I774 Celiac artery compression syndrome: Secondary | ICD-10-CM | POA: Diagnosis not present

## 2017-09-13 DIAGNOSIS — I83811 Varicose veins of right lower extremities with pain: Secondary | ICD-10-CM | POA: Diagnosis not present

## 2017-09-13 DIAGNOSIS — I771 Stricture of artery: Secondary | ICD-10-CM

## 2017-09-13 DIAGNOSIS — N183 Chronic kidney disease, stage 3 unspecified: Secondary | ICD-10-CM

## 2017-09-13 DIAGNOSIS — R1031 Right lower quadrant pain: Secondary | ICD-10-CM | POA: Diagnosis not present

## 2017-09-13 NOTE — Progress Notes (Signed)
MRN : [id number]  [name] is a 83 y.o. ([birth year]) female who presents with chief complaint of  Chief Complaint  Patient presents with  . Follow-up  .  History of Present Illness: Patient returns today in follow up of celiac stenosis.  She continues to have a fair bit of upper abdominal pain.  She had an angiogram performed several times she was found to have high-grade, near occlusive celiac artery stenosis that was not amenable to stent placement from a femoral approach due to a steep angulation.  Angioplasty was performed but with a suboptimal result.  Her symptoms are about the same.  Her access site is well-healed.  She denies fever or chills. She also complains of significant right lower extremity swelling associated varicosities on the right leg.  This has been a chronic problem for many years.  This has gotten worse over time and she sees significant heaviness and dragging of her leg regularly.  she denies any chest pain or shortness of breath.  She does not have an antecedent history of DVT or superficial thrombophlebitis to her knowledge.  Current Outpatient Medications  Medication Sig Dispense Refill  . acetaminophen (TYLENOL) 500 MG tablet Take 500 mg by mouth every 6 (six) hours as needed for mild pain.    . Ascorbic Acid (VITAMIN C CR) 500 MG CPCR Take 500 mg by mouth daily.    [name] aspirin EC 81 MG tablet Take 1 tablet (81 mg total) by mouth daily. 150 tablet 2  . atorvastatin (LIPITOR) 10 MG tablet Take 1 tablet (10 mg total) by mouth daily. 30 tablet 11  . Brewers Yeast 487.5 MG TABS Take 1 tablet by mouth daily.     . Calcium Carbonate-Vitamin D (CALCIUM + D PO) Take 1 tablet by mouth daily.     . Cholecalciferol (VITAMIN D3) 3000 UNITS TABS Take 1 tablet by mouth daily.     [name] CINNAMON PO Take 1 tablet by mouth daily.     [name] co-enzyme Q-10 30 MG capsule Take 30 mg by mouth daily.     . Glucosamine Sulfate 500 MG CAPS Take 500 mg by mouth daily.    [name] ibuprofen  (ADVIL,MOTRIN) 200 MG tablet Take 200 mg by mouth every 6 (six) hours as needed (achiness).     [name] ketotifen (ZADITOR) 0.025 % ophthalmic solution Place 1 drop into both eyes 2 (two) times daily as needed (dryness).    . Lactobacillus (PROBIOTIC ACIDOPHILUS PO) Take 1 capsule by mouth daily.     [name] loratadine (CLARITIN) 10 MG tablet Take 10 mg by mouth daily as needed for allergies.    . Magnesium 400 MG CAPS Take 400 mg by mouth daily.    . Multiple Vitamin (MULTIVITAMIN) capsule Take 1 capsule by mouth daily.    [name] neomycin-bacitracin-polymyxin (NEOSPORIN) [phone] ointment Apply topically daily as needed (itching).    . Omega-3 Fatty Acids (OMEGA 3 PO) Take 1 tablet by mouth daily.     . sodium chloride (OCEAN) 0.65 % SOLN nasal spray Place 1 spray into both nostrils every 4 (four) hours as needed for congestion.     . Turmeric 450 MG CAPS Take 450 Units by mouth.    . vitamin B-12 (CYANOCOBALAMIN) 1000 MCG tablet Take 1,000 mcg by mouth daily.     No current facility-administered medications for this visit.     Past Medical History:  Diagnosis Date  . Arthritis   . Atherosclerosis   . Atherosclerosis of  abdominal aorta ([place])   . Chronic kidney disease    stage 2  . CKD (chronic kidney disease) stage 2, GFR 60-89 ml/min   . Collagen vascular disease ([place])   . Diverticulosis 2008  . GERD (gastroesophageal reflux disease)   . Heart murmur   . Hyperlipidemia   . Lumbar spinal stenosis   . Osteoporosis   . Ovarian lump   . RA (rheumatoid arthritis) ([place])   . Raynaud's disease   . Rheumatoid arthritis ([place])   . Spinal stenosis     Past Surgical History:  Procedure Laterality Date  . BREAST SURGERY Left 1965   Benign biopsy  . CHOLECYSTECTOMY  2005   [place]  . COLONOSCOPY  2008   Dr. Donnella Sham  . EXCISION OF BREAST BIOPSY Left 1965   benign  . EYE SURGERY Bilateral    Cataract Extraction with IOL     Social History     Social History  Substance Use Topics  .  Smoking status: Never Smoker  . Smokeless tobacco: Never Used  . Alcohol use No  NO IVDU  Family History      Family History  Problem Relation Age of Onset  . Heart disease Mother   . Hypertension Father   . Heart disease Brother 2       heart attack  . Cancer Brother        bladder  . Diabetes Sister   . Alcohol abuse Sister   . Kidney disease Sister   . Diabetes Brother   . Stroke Brother   . Diabetes Maternal Grandmother   . Heart disease Maternal Grandmother   . Cancer Brother        lung          Allergies  Allergen Reactions  . Ibandronic Acid Other (See Comments)    Achy all over. Flu like S/S  . Other Other (See Comments)    Achy all over. Flu like S/S Dysphagia  . Actonel [Risedronate] Other (See Comments)    Dysphagia Dysphagia dysphagia  . Raloxifene Other (See Comments)    Mood swings  . Versed [Midazolam] Other (See Comments)    Difficult waking up  . Doxycycline Rash  . Prednisone Rash     REVIEW OF SYSTEMS (Negative unless checked)  Constitutional: [x] Weight loss  [] Fever  [] Chills Cardiac: [] Chest pain   [] Chest pressure   [x] Palpitations   [] Shortness of breath when laying flat   [] Shortness of breath at rest   [] Shortness of breath with exertion. Vascular:  [] Pain in legs with walking   [] Pain in legs at rest   [] Pain in legs when laying flat   [] Claudication   [] Pain in feet when walking  [] Pain in feet at rest  [] Pain in feet when laying flat   [] History of DVT   [] Phlebitis   [] Swelling in legs   [x] Varicose veins   [] Non-healing ulcers Pulmonary:   [] Uses home oxygen   [] Productive cough   [] Hemoptysis   [] Wheeze  [] COPD   [] Asthma Neurologic:  [] Dizziness  [] Blackouts   [] Seizures   [] History of stroke   [] History of TIA  [] Aphasia   [] Temporary blindness   [] Dysphagia   [] Weakness or numbness in arms   [] Weakness or numbness in legs Musculoskeletal:  [x] Arthritis   [] Joint swelling   [] Joint pain   [x] Low  back pain Hematologic:  [] Easy bruising  [] Easy bleeding   [] Hypercoagulable state   [] Anemic   Gastrointestinal:  [] Blood in stool   []   Vomiting blood  [x] Gastroesophageal reflux/heartburn   [x] Abdominal pain Genitourinary:  [x] Chronic kidney disease   [] Difficult urination  [] Frequent urination  [] Burning with urination   [] Hematuria Skin:  [] Rashes   [] Ulcers   [] Wounds Psychological:  [x] History of anxiety   []  History of major depression.     Physical Examination  BP (!) 158/81 (BP Location: Right Arm)   Pulse 69   Resp 16   Ht 5' (1.524 m)   Wt 47.8 kg (105 lb 6.4 oz)   BMI 20.58 kg/m  Gen:  WD/WN, NAD.  Appears younger than stated age Head: [place]/AT, No temporalis wasting. Ear/Nose/Throat: Hearing grossly intact, nares w/o erythema or drainage, trachea midline Eyes: Conjunctiva clear. Sclera non-icteric Neck: Supple.  No JVD.  Pulmonary:  Good air movement, no use of accessory muscles.  Cardiac: RRR, normal S1, S2 Vascular: Extensive varicosities measuring 3-4 mm in the right lower extremity.  Diffuse varicosities on the left measuring 2-3 mm. Vessel Right Left  Radial Palpable Palpable                                    Musculoskeletal: M/S 5/5 throughout.  No deformity or atrophy.  1+ right lower extremity edema, no appreciable left lower extremity edema. Neurologic: Sensation grossly intact in extremities.  Symmetrical.  Speech is fluent.  Psychiatric: Judgment intact, Mood & affect appropriate for pt's clinical situation. Dermatologic: No rashes or ulcers noted.  No cellulitis or open wounds.      Labs No results found for this or any previous visit (from the past 2160 hour(s)).  Radiology No results found.    Assessment/Plan Hyperlipidemia lipid control important in reducing the progression of atherosclerotic disease.    Abdominal pain Unclear etiology but certainly worsening. Given her previous history of mesenteric vascular disease, we're  going to reassess this.  Chronic kidney disease, stage III (moderate) Given the need to limit contrast, we will start with noninvasive studies performed proceed with angiogram.   Varicose veins of leg with pain, right Recommend the compression stockings during daylight hours, leg elevation, and increasing her activity.  We have discussed the role of venous intervention for refractory symptomatic reflux.  A venous reflux study will be done at her convenience in the future.  Celiac artery stenosis (HCC) The patient has a high-grade celiac artery stenosis that was not amenable to treatment from a femoral approach.  To address this, a brachial artery cutdown with anesthesia will be required.  I have discussed the risks and benefits of the procedure.  I have discussed this in detail with the patient and her daughter and she desires to proceed.    Leotis Pain, MD  09/13/2017 9:39 AM    This note was created with Dragon medical transcription system.  Any errors from dictation are purely unintentional

## 2017-09-13 NOTE — Patient Instructions (Signed)
Varicose Veins Varicose veins are veins that have become enlarged and twisted. They are usually seen in the legs but can occur in other parts of the body as well. What are the causes? This condition is the result of valves in the veins not working properly. Valves in the veins help to return blood from the leg to the heart. If these valves are damaged, blood flows backward and backs up into the veins in the leg near the skin. This causes the veins to become larger. What increases the risk? People who are on their feet a lot, who are pregnant, or who are overweight are more likely to develop varicose veins. What are the signs or symptoms?  Bulging, twisted-appearing, bluish veins, most commonly found on the legs.  Leg pain or a feeling of heaviness. These symptoms may be worse at the end of the day.  Leg swelling.  Changes in skin color. How is this diagnosed? A health care provider can usually diagnose varicose veins by examining your legs. Your health care provider may also recommend an ultrasound of your leg veins. How is this treated? Most varicose veins can be treated at home.However, other treatments are available for people who have persistent symptoms or want to improve the cosmetic appearance of the varicose veins. These treatment options include:  Sclerotherapy. A solution is injected into the vein to close it off.  Laser treatment. A laser is used to heat the vein to close it off.  Radiofrequency vein ablation. An electrical current produced by radio waves is used to close off the vein.  Phlebectomy. The vein is surgically removed through small incisions made over the varicose vein.  Vein ligation and stripping. The vein is surgically removed through incisions made over the varicose vein after the vein has been tied (ligated). Follow these instructions at home:   Do not stand or sit in one position for long periods of time. Do not sit with your legs crossed. Rest with your  legs raised during the day.  Wear compression stockings as directed by your health care provider. These stockings help to prevent blood clots and reduce swelling in your legs.  Do not wear other tight, encircling garments around your legs, pelvis, or waist.  Walk as much as possible to increase blood flow.  Raise the foot of your bed at night with 2-inch blocks.  If you get a cut in the skin over the vein and the vein bleeds, lie down with your leg raised and press on it with a clean cloth until the bleeding stops. Then place a bandage (dressing) on the cut. See your health care provider if it continues to bleed. Contact a health care provider if:  The skin around your ankle starts to break down.  You have pain, redness, tenderness, or hard swelling in your leg over a vein.  You are uncomfortable because of leg pain. This information is not intended to replace advice given to you by your health care provider. Make sure you discuss any questions you have with your health care provider. Document Released: 08/04/2005 Document Revised: 04/01/2016 Document Reviewed: 04/27/2016 Elsevier Interactive Patient Education  2017 [employer]  

## 2017-09-13 NOTE — Assessment & Plan Note (Signed)
Recommend the compression stockings during daylight hours, leg elevation, and increasing her activity.  We have discussed the role of venous intervention for refractory symptomatic reflux.  A venous reflux study will be done at her convenience in the future.

## 2017-09-13 NOTE — Assessment & Plan Note (Signed)
The patient has a high-grade celiac artery stenosis that was not amenable to treatment from a femoral approach.  To address this, a brachial artery cutdown with anesthesia will be required.  I have discussed the risks and benefits of the procedure.  I have discussed this in detail with the patient and her daughter and she desires to proceed.

## 2017-10-04 MED ORDER — CEFAZOLIN SODIUM-DEXTROSE 1-4 GM/50ML-% IV SOLN: 1.0000 g | Freq: Once | INTRAVENOUS | Status: DC

## 2017-10-05 ENCOUNTER — Encounter
Admission: RE | Disposition: A | Discharge status: Home / Self Care | Payer: Self-pay | Source: Ambulatory Visit | Attending: Vascular Surgery

## 2017-10-05 ENCOUNTER — Encounter: Payer: Self-pay | Admitting: *Deleted

## 2017-10-05 ENCOUNTER — Ambulatory Visit
Admission: RE | Admit: 2017-10-05 | Discharge: 2017-10-05 | Disposition: A | Discharge status: Home / Self Care | Payer: Medicare Other | Source: Ambulatory Visit | Attending: Vascular Surgery | Admitting: Vascular Surgery

## 2017-10-05 ENCOUNTER — Ambulatory Visit: Payer: Medicare Other | Admitting: Anesthesiology

## 2017-10-05 DIAGNOSIS — M069 Rheumatoid arthritis, unspecified: Secondary | ICD-10-CM | POA: Diagnosis not present

## 2017-10-05 DIAGNOSIS — Z8052 Family history of malignant neoplasm of bladder: Secondary | ICD-10-CM | POA: Diagnosis not present

## 2017-10-05 DIAGNOSIS — N183 Chronic kidney disease, stage 3 (moderate): Secondary | ICD-10-CM | POA: Diagnosis not present

## 2017-10-05 DIAGNOSIS — Z9889 Other specified postprocedural states: Secondary | ICD-10-CM | POA: Insufficient documentation

## 2017-10-05 DIAGNOSIS — Z8249 Family history of ischemic heart disease and other diseases of the circulatory system: Secondary | ICD-10-CM | POA: Insufficient documentation

## 2017-10-05 DIAGNOSIS — I774 Celiac artery compression syndrome: Secondary | ICD-10-CM | POA: Insufficient documentation

## 2017-10-05 DIAGNOSIS — Z79899 Other long term (current) drug therapy: Secondary | ICD-10-CM | POA: Diagnosis not present

## 2017-10-05 DIAGNOSIS — Z811 Family history of alcohol abuse and dependence: Secondary | ICD-10-CM | POA: Insufficient documentation

## 2017-10-05 DIAGNOSIS — Z833 Family history of diabetes mellitus: Secondary | ICD-10-CM | POA: Insufficient documentation

## 2017-10-05 DIAGNOSIS — M359 Systemic involvement of connective tissue, unspecified: Secondary | ICD-10-CM | POA: Insufficient documentation

## 2017-10-05 DIAGNOSIS — K551 Chronic vascular disorders of intestine: Secondary | ICD-10-CM | POA: Insufficient documentation

## 2017-10-05 DIAGNOSIS — E785 Hyperlipidemia, unspecified: Secondary | ICD-10-CM | POA: Diagnosis not present

## 2017-10-05 DIAGNOSIS — Z881 Allergy status to other antibiotic agents status: Secondary | ICD-10-CM | POA: Diagnosis not present

## 2017-10-05 DIAGNOSIS — Z888 Allergy status to other drugs, medicaments and biological substances status: Secondary | ICD-10-CM | POA: Diagnosis not present

## 2017-10-05 DIAGNOSIS — Z7982 Long term (current) use of aspirin: Secondary | ICD-10-CM | POA: Diagnosis not present

## 2017-10-05 DIAGNOSIS — Z9049 Acquired absence of other specified parts of digestive tract: Secondary | ICD-10-CM | POA: Insufficient documentation

## 2017-10-05 DIAGNOSIS — Z9109 Other allergy status, other than to drugs and biological substances: Secondary | ICD-10-CM | POA: Insufficient documentation

## 2017-10-05 DIAGNOSIS — M810 Age-related osteoporosis without current pathological fracture: Secondary | ICD-10-CM | POA: Diagnosis not present

## 2017-10-05 DIAGNOSIS — Z841 Family history of disorders of kidney and ureter: Secondary | ICD-10-CM | POA: Diagnosis not present

## 2017-10-05 DIAGNOSIS — Z801 Family history of malignant neoplasm of trachea, bronchus and lung: Secondary | ICD-10-CM | POA: Insufficient documentation

## 2017-10-05 DIAGNOSIS — I700 Atherosclerosis of aorta: Secondary | ICD-10-CM | POA: Insufficient documentation

## 2017-10-05 DIAGNOSIS — I83811 Varicose veins of right lower extremities with pain: Secondary | ICD-10-CM | POA: Diagnosis not present

## 2017-10-05 DIAGNOSIS — Z823 Family history of stroke: Secondary | ICD-10-CM | POA: Insufficient documentation

## 2017-10-05 DIAGNOSIS — K219 Gastro-esophageal reflux disease without esophagitis: Secondary | ICD-10-CM | POA: Diagnosis not present

## 2017-10-05 DIAGNOSIS — I739 Peripheral vascular disease, unspecified: Secondary | ICD-10-CM | POA: Diagnosis not present

## 2017-10-05 HISTORY — PX: VISCERAL ANGIOGRAPHY: CATH118276

## 2017-10-05 LAB — CREATININE, SERUM
Creatinine, Ser: 0.54 mg/dL (ref 0.44–1.00)
GFR calc Af Amer: >60 mL/min (ref >60)
GFR calc non Af Amer: >60 mL/min (ref >60)

## 2017-10-05 LAB — BUN: BUN: 13 mg/dL (ref 6–20)

## 2017-10-05 SURGERY — VISCERAL ANGIOGRAPHY
Anesthesia: General

## 2017-10-05 MED ORDER — SODIUM CHLORIDE 0.9 % IV SOLN
INTRAVENOUS | Status: DC
  Administered 2017-10-05: 08:00:00 via INTRAVENOUS

## 2017-10-05 MED ORDER — SUGAMMADEX SODIUM 200 MG/2ML IV SOLN
INTRAVENOUS | Status: DC | PRN
  Administered 2017-10-05: 95.2 mg via INTRAVENOUS

## 2017-10-05 MED ORDER — ONDANSETRON HCL 4 MG/2ML IJ SOLN
4.0000 mg | Freq: Once | INTRAMUSCULAR | Status: AC | PRN
  Administered 2017-10-05: 4 mg via INTRAVENOUS

## 2017-10-05 MED ORDER — MORPHINE SULFATE (PF) 2 MG/ML IV SOLN: 2.0000 mg | Freq: Once | INTRAVENOUS | Status: DC

## 2017-10-05 MED ORDER — SEVOFLURANE IN SOLN
RESPIRATORY_TRACT | Status: AC
  Filled 2017-10-05: qty 250

## 2017-10-05 MED ORDER — HYDROCODONE-ACETAMINOPHEN 5-325 MG PO TABS
1.0000 | ORAL_TABLET | Freq: Once | ORAL | Status: AC
  Administered 2017-10-05: 1 via ORAL

## 2017-10-05 MED ORDER — SODIUM CHLORIDE 0.9 % IV SOLN
INTRAVENOUS | Status: DC | PRN
  Administered 2017-10-05: 20 ug/min via INTRAVENOUS

## 2017-10-05 MED ORDER — ONDANSETRON HCL 4 MG/2ML IJ SOLN
INTRAMUSCULAR | Status: AC
  Filled 2017-10-05: qty 2

## 2017-10-05 MED ORDER — LIDOCAINE HCL (CARDIAC) 20 MG/ML IV SOLN
INTRAVENOUS | Status: DC | PRN
  Administered 2017-10-05: 100 mg via INTRAVENOUS

## 2017-10-05 MED ORDER — FENTANYL CITRATE (PF) 100 MCG/2ML IJ SOLN
INTRAMUSCULAR | Status: DC | PRN
  Administered 2017-10-05: 50 ug via INTRAVENOUS

## 2017-10-05 MED ORDER — SUGAMMADEX SODIUM 200 MG/2ML IV SOLN
INTRAVENOUS | Status: AC
Start: 2017-10-05 — End: ?
  Filled 2017-10-05: qty 2

## 2017-10-05 MED ORDER — FENTANYL CITRATE (PF) 100 MCG/2ML IJ SOLN
INTRAMUSCULAR | Status: AC
  Filled 2017-10-05: qty 2

## 2017-10-05 MED ORDER — FENTANYL CITRATE (PF) 100 MCG/2ML IJ SOLN
25.0000 ug | INTRAMUSCULAR | Status: DC | PRN
  Administered 2017-10-05 (×2): 25 ug via INTRAVENOUS

## 2017-10-05 MED ORDER — SUCCINYLCHOLINE CHLORIDE 20 MG/ML IJ SOLN
INTRAMUSCULAR | Status: DC | PRN
  Administered 2017-10-05: 100 mg via INTRAVENOUS

## 2017-10-05 MED ORDER — PROPOFOL 10 MG/ML IV BOLUS
INTRAVENOUS | Status: AC
  Filled 2017-10-05: qty 20

## 2017-10-05 MED ORDER — CLOPIDOGREL BISULFATE 75 MG PO TABS: 75.0000 mg | ORAL_TABLET | Freq: Every day | ORAL | 11 refills | Status: DC

## 2017-10-05 MED ORDER — FENTANYL CITRATE (PF) 100 MCG/2ML IJ SOLN
INTRAMUSCULAR | Status: AC
  Administered 2017-10-05: 25 ug via INTRAVENOUS
  Filled 2017-10-05: qty 2

## 2017-10-05 MED ORDER — HEPARIN SODIUM (PORCINE) 1000 UNIT/ML IJ SOLN
INTRAMUSCULAR | Status: DC | PRN
  Administered 2017-10-05: 4000 [IU] via INTRAVENOUS
  Administered 2017-10-05: 1000 [IU] via INTRAVENOUS

## 2017-10-05 MED ORDER — GLYCOPYRROLATE 0.2 MG/ML IJ SOLN
INTRAMUSCULAR | Status: DC | PRN
  Administered 2017-10-05: 0.2 mg via INTRAVENOUS

## 2017-10-05 MED ORDER — METHYLPREDNISOLONE SODIUM SUCC 125 MG IJ SOLR: 125.0000 mg | INTRAMUSCULAR | Status: DC | PRN

## 2017-10-05 MED ORDER — FAMOTIDINE 20 MG PO TABS: 40.0000 mg | ORAL_TABLET | ORAL | Status: DC | PRN

## 2017-10-05 MED ORDER — HYDROCODONE-ACETAMINOPHEN 5-325 MG PO TABS
ORAL_TABLET | ORAL | Status: AC
  Administered 2017-10-05: 1 via ORAL
  Filled 2017-10-05: qty 1

## 2017-10-05 MED ORDER — PROPOFOL 10 MG/ML IV BOLUS
INTRAVENOUS | Status: DC | PRN
  Administered 2017-10-05: 20 mg via INTRAVENOUS
  Administered 2017-10-05: 70 mg via INTRAVENOUS

## 2017-10-05 MED ORDER — CEFAZOLIN SODIUM-DEXTROSE 1-4 GM/50ML-% IV SOLN
INTRAVENOUS | Status: AC
  Filled 2017-10-05: qty 50

## 2017-10-05 MED ORDER — ROCURONIUM BROMIDE 100 MG/10ML IV SOLN
INTRAVENOUS | Status: DC | PRN
  Administered 2017-10-05: 10 mg via INTRAVENOUS
  Administered 2017-10-05: 40 mg via INTRAVENOUS

## 2017-10-05 SURGICAL SUPPLY — 37 items
ADH SKN CLS APL DERMABOND .7 (GAUZE/BANDAGES/DRESSINGS) ×1
CATH BEACON 5 .035 100 C2 TIP (CATHETERS) ×2 IMPLANT
CATH BEACON 5 .035 65 C2 TIP (CATHETERS) ×2 IMPLANT
CATH BEACON 5 .038 100 VERT TP (CATHETERS) ×2 IMPLANT
CATH PIG 70CM (CATHETERS) ×2 IMPLANT
CATH VISTA GUIDE 6FR MPA1 (CATHETERS) ×2 IMPLANT
COVER DRAPE FLUORO 36X44 (DRAPES) ×4 IMPLANT
DERMABOND ADVANCED (GAUZE/BANDAGES/DRESSINGS) ×1
DERMABOND ADVANCED .7 DNX12 (GAUZE/BANDAGES/DRESSINGS) ×1 IMPLANT
DEVICE PRESTO INFLATION (MISCELLANEOUS) ×2 IMPLANT
DEVICE TORQUE (MISCELLANEOUS) ×2 IMPLANT
DRAPE BRACHIAL (DRAPES) ×2 IMPLANT
DRAPE INCISE IOBAN 66X45 STRL (DRAPES) ×2 IMPLANT
DRESSING SURGICEL FIBRLLR 1X2 (HEMOSTASIS) ×1 IMPLANT
DRSG SURGICEL FIBRILLAR 1X2 (HEMOSTASIS) ×2
GLIDECATH F4/38/100ST (CATHETERS) ×2 IMPLANT
GLIDEWIRE ADV .035X260CM (WIRE) ×2 IMPLANT
LOOP RED MAXI  1X406MM (MISCELLANEOUS) ×2
LOOP VESSEL MAXI 1X406 RED (MISCELLANEOUS) ×2 IMPLANT
PACK ANGIOGRAPHY (CUSTOM PROCEDURE TRAY) ×2 IMPLANT
PENCIL ELECTRO HAND CTR (MISCELLANEOUS) ×2 IMPLANT
SHEATH BRITE TIP 5FRX11 (SHEATH) ×4 IMPLANT
SHEATH PINNACLE ST 6F 65CM (SHEATH) ×2 IMPLANT
SHIELD RADPAD DADD DRAPE 4X9 (MISCELLANEOUS) ×2 IMPLANT
SHIELD X-DRAPE GOLD 12X17 (MISCELLANEOUS) ×2 IMPLANT
SPONGE XRAY 4X4 16PLY STRL (MISCELLANEOUS) ×4 IMPLANT
STENT LIFESTREAM 6X26X135 (Permanent Stent) ×2 IMPLANT
SUT MNCRL AB 4-0 PS2 18 (SUTURE) ×2 IMPLANT
SUT PROLENE 6 0 BV (SUTURE) ×10 IMPLANT
SUT SILK 3 0 (SUTURE) ×1
SUT SILK 3-0 18XBRD TIE 12 (SUTURE) ×1 IMPLANT
SUTURE VIC 3-0 (SUTURE) ×2 IMPLANT
SYR MEDRAD MARK V 150ML (SYRINGE) ×2 IMPLANT
TOWEL OR 17X26 4PK STRL BLUE (TOWEL DISPOSABLE) ×4 IMPLANT
TUBING CONTRAST HIGH PRESS 72 (TUBING) ×4 IMPLANT
WIRE J 3MM .035X145CM (WIRE) ×2 IMPLANT
WIRE MAGIC TORQUE 260C (WIRE) ×2 IMPLANT

## 2017-10-05 NOTE — Anesthesia Procedure Notes (Signed)
Procedure Name: Intubation Date/Time: 10/05/2017 8:45 AM Performed by: Nelda Marseille, CRNA Pre-anesthesia Checklist: Patient identified, Patient being monitored, Timeout performed, Emergency Drugs available and Suction available Patient Re-evaluated:Patient Re-evaluated prior to induction Oxygen Delivery Method: Circle system utilized Preoxygenation: Pre-oxygenation with 100% oxygen Induction Type: IV induction Ventilation: Mask ventilation without difficulty Laryngoscope Size: Mac and 3 Grade View: Grade I Tube type: Oral Tube size: 7.0 mm Number of attempts: 1 Airway Equipment and Method: Stylet Placement Confirmation: ETT inserted through vocal cords under direct vision,  positive ETCO2 and breath sounds checked- equal and bilateral Secured at: 21 cm Tube secured with: Tape Dental Injury: Teeth and Oropharynx as per pre-operative assessment

## 2017-10-05 NOTE — H&P (Signed)
 [facility] History & Physical Update  The patient was interviewed and re-examined.  The patient's previous History and Physical has been reviewed and is unchanged.  There is no change in the plan of care. We plan to proceed with the scheduled procedure.  Leotis Pain, MD  10/05/2017, 8:36 AM

## 2017-10-05 NOTE — Transfer of Care (Signed)
Immediate Anesthesia Transfer of Care Note  Patient: [name]  Procedure(s) Performed: VISCERAL ANGIOGRAPHY (N/A )  Patient Location: PACU  Anesthesia Type:General  Level of Consciousness: sedated  Airway & Oxygen Therapy: Patient Spontanous Breathing and Patient connected to face mask oxygen  Post-op Assessment: Report given to RN and Post -op Vital signs reviewed and stable  Post vital signs: Reviewed and stable  Last Vitals:  Vitals:   10/05/17 0731 10/05/17 1106  BP: (!) 155/76   Pulse: (!) 55   Resp: 16   Temp: (!) 36.4 C (!) (P) 36.2 C  SpO2: 97%     Last Pain:  Vitals:   10/05/17 0731  TempSrc: Oral  PainSc: 1          Complications: No apparent anesthesia complications

## 2017-10-05 NOTE — Anesthesia Postprocedure Evaluation (Signed)
Anesthesia Post Note  Patient: [name]  Procedure(s) Performed: VISCERAL ANGIOGRAPHY (N/A )  Patient location during evaluation: PACU Anesthesia Type: General Level of consciousness: awake and alert Pain management: pain level controlled Vital Signs Assessment: post-procedure vital signs reviewed and stable Respiratory status: spontaneous breathing and respiratory function stable Cardiovascular status: stable Anesthetic complications: no     Last Vitals:  Vitals:   10/05/17 1136 10/05/17 1146  BP: (!) 157/66 (!) 153/64  Pulse: (!) 52 (!) 55  Resp: 16 16  Temp: 36.7 C   SpO2: 96% 97%    Last Pain:  Vitals:   10/05/17 1146  TempSrc:   PainSc: 4                  KEPHART,WILLIAM K

## 2017-10-05 NOTE — Anesthesia Post-op Follow-up Note (Signed)
Anesthesia QCDR form completed.        

## 2017-10-05 NOTE — Anesthesia Preprocedure Evaluation (Addendum)
Anesthesia Evaluation  Patient identified by MRN, date of birth, ID band Patient awake    Reviewed: Allergy & Precautions, NPO status , Patient's Chart, lab work & pertinent test results  History of Anesthesia Complications (+) PROLONGED EMERGENCE and history of anesthetic complications  Airway Mallampati: II       Dental  (+) Partial Lower, Partial Upper   Pulmonary neg sleep apnea, neg COPD,           Cardiovascular (-) hypertension+ Peripheral Vascular Disease  (-) Past MI and (-) CHF (-) dysrhythmias      Neuro/Psych neg Seizures    GI/Hepatic Neg liver ROS, GERD  Poorly Controlled,  Endo/Other  neg diabetes  Renal/GU Renal InsufficiencyRenal disease     Musculoskeletal   Abdominal   Peds  Hematology   Anesthesia Other Findings   Reproductive/Obstetrics                            Anesthesia Physical Anesthesia Plan  ASA: III  Anesthesia Plan: General   Post-op Pain Management:    Induction:   PONV Risk Score and Plan: 3 and Dexamethasone, Ondansetron and Treatment may vary due to age or medical condition  Airway Management Planned: Oral ETT  Additional Equipment:   Intra-op Plan:   Post-operative Plan:   Informed Consent: I have reviewed the patients History and Physical, chart, labs and discussed the procedure including the risks, benefits and alternatives for the proposed anesthesia with the patient or authorized representative who has indicated his/her understanding and acceptance.     Plan Discussed with:   Anesthesia Plan Comments:         Anesthesia Quick Evaluation

## 2017-10-05 NOTE — Op Note (Signed)
[facility] Percutaneous Study/Intervention Procedural Note   Date: 10/05/2017  Surgeon(s): Leotis Pain, MD  Assistants: none  Pre-operative Diagnosis: 1.  Chronic Mesenteric ischemia 2. Celiac stenosis   Post-operative diagnosis: Same  Procedure(s) Performed: 1. Left brachial artery cutdown 2. Catheter placement into splenic artery from left brachial approach  3. Aortogram and selective angiogram of the celiac artery 4. Stent to the celiac artery with 6 mm diameter x 26 mm length lifestream balloon expandable stent   Contrast: 65 cc  Fluoro time: 17 minutes  EBL: 10 cc  Anesthesia: general  Indications: Patient is a 83 y.o. female who has symptoms consistent with mesenteric ischemia. The patient has a duplex and previous angiogram showing high grade celiac stenosis. The patient is brought in for angiography for further evaluation and potential treatment. Risks and benefits are discussed and informed consent is obtained  Procedure: The patient was identified and appropriate procedural time out was performed. The patient was then placed supine on the table and prepped and draped in the usual sterile fashion. Anesthesia provided general anesthetic.  A small transverse incision was created just above the left antecubital fossa and we dissected out the brachial artery without difficulty.  It was encircled with Vesseloops proximally and distally and the patient was systemically heparinized.  A Seldinger needle was then used to access the left brachial artery under direct visualization. A 0.035 J wire was advanced without resistance and a 5Fr sheath was placed. 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  This required a C2 catheter and an advantage wire to get into the descending thoracic aorta and  advanced down to the proximal abdominal aorta.  We then exchanged for a pigtail catheter and an AP aortogram was performed. This demonstrated the expected near occlusive stenosis of the celiac artery just beyond its origin and an SMA that did not have significant disease.  The aorta was calcific but not stenotic.We upsized to a 6 Fr 65 cm sheath.  A multipurpose guide catheter was used to selectively cannulate the celiac artery.  This was done with a significant amount of difficulty after already having tried a Kumpe catheter and a C2 catheter.  The difficult aortic arch created stored energy and catheter manipulation was very difficult. I was able to advance the multipurpose guide catheter into the celiac artery and take the advantage wire well out into the splenic artery.  To get better purchase, I then used a 100 cm glide catheter and advanced this out through the celiac artery and into the splenic artery and exchanged for a Magic torque wire.  The sheath was a few centimeters from the origin of the celiac artery.  Selective imaging was then performed through the sheath.  Based on her symptoms and these findings, I elected to treat the celiac artery to try to improve the patient's clinical course. I then used a 6 mm diameter x 26 mm length lifestream balloon expandable stent to perform treatment of the celiac artery. I inflated the balloon to 16 Atm. On completion angiogram following this, less than 10 % residual stenosis of the celiac artery was identified. At this point, I elected to terminate the procedure. The diagnostic catheter was removed.  The left brachial artery was then repaired with 3 interrupted 6-0 Prolene sutures.  Fibrillar was placed.  The wound was then closed with a 3-0 Vicryl and 4-0 Monocryl.  The patient  was taken to the recovery room in stable condition having tolerated the procedure well.     Findings:Aorta was calcific but not stenotic.  Celiac artery with a very high-grade near  occlusive stenosis just beyond its origin.  The SMA was widely patent without significant stenosis.  Disposition: Patient was taken to the recovery room in stable condition having tolerated the procedure well.  Complications: None  Leotis Pain 10/05/2017 11:26 AM   This note was created with Dragon Medical transcription system. Any errors in dictation are purely unintentional.

## 2017-10-13 NOTE — Progress Notes (Deleted)
MRN : [id number]  [name] is a 83 y.o. ([birth year]) female who presents with chief complaint of No chief complaint on file. [name]  History of Present Illness: Patient returns today in follow up of ***  Current Outpatient Medications  Medication Sig Dispense Refill  . acetaminophen (TYLENOL) 500 MG tablet Take 500 mg by mouth every 6 (six) hours as needed for mild pain.    . Ascorbic Acid (VITAMIN C CR) 500 MG CPCR Take 500 mg by mouth daily.    [name] aspirin EC 81 MG tablet Take 1 tablet (81 mg total) by mouth daily. (Patient taking differently: Take 81 mg by mouth 3 (three) times a week. ) 150 tablet 2  . atorvastatin (LIPITOR) 10 MG tablet Take 1 tablet (10 mg total) by mouth daily. (Patient taking differently: Take 10 mg by mouth daily at 6 PM. ) 30 tablet 11  . Brewers Yeast 487.5 MG TABS Take 1 tablet by mouth daily.     [name] CALCIUM-VITAMIN D-VITAMIN K PO Take 1 tablet by mouth daily.    . Cholecalciferol (VITAMIN D3) 3000 UNITS TABS Take 3,000 Units by mouth 2 (two) times daily.     [name] CINNAMON PO Take 1,000 mg by mouth daily.     . clopidogrel (PLAVIX) 75 MG tablet Take 1 tablet (75 mg total) by mouth daily. 30 tablet 11  . co-enzyme Q-10 30 MG capsule Take 30 mg by mouth daily.     . Ginkgo Biloba (GNP GINGKO BILOBA EXTRACT PO) Take 1 tablet by mouth daily.    . Glucosamine Sulfate 500 MG CAPS Take 500 mg by mouth daily.    [name] ketotifen (ZADITOR) 0.025 % ophthalmic solution Place 1 drop into both eyes 2 (two) times daily.     . Lactobacillus (PROBIOTIC ACIDOPHILUS PO) Take 1 capsule by mouth daily.     [name] loratadine (CLARITIN) 10 MG tablet Take 10 mg by mouth daily as needed for allergies.    . Magnesium 400 MG CAPS Take 400 mg by mouth daily.    . Multiple Vitamin (MULTIVITAMIN) capsule Take 1 capsule by mouth daily.    [name] neomycin-bacitracin-polymyxin (NEOSPORIN) [phone] ointment Apply 1 application topically daily as needed (itching).     . Omega-3 Fatty Acids (OMEGA 3 PO)  Take 520 mg by mouth daily.     . sodium chloride (OCEAN) 0.65 % SOLN nasal spray Place 1 spray into both nostrils every 4 (four) hours as needed for congestion.     . Turmeric 450 MG CAPS Take 450 Units by mouth.    . vitamin B-12 (CYANOCOBALAMIN) 1000 MCG tablet Take 1,000 mcg by mouth daily.     No current facility-administered medications for this visit.     Past Medical History:  Diagnosis Date  . Arthritis   . Atherosclerosis   . Atherosclerosis of abdominal aorta ([place])   . Chronic kidney disease    stage 2  . CKD (chronic kidney disease) stage 2, GFR 60-89 ml/min   . Collagen vascular disease ([place])   . Diverticulosis 2008  . GERD (gastroesophageal reflux disease)   . Heart murmur   . Hyperlipidemia   . Lumbar spinal stenosis   . Osteoporosis   . Ovarian lump   . RA (rheumatoid arthritis) ([place])   . Raynaud's disease   . Rheumatoid arthritis ([place])   . Spinal stenosis     Past Surgical History:  Procedure Laterality Date  . BREAST SURGERY Left 1965   Benign  biopsy  . CHOLECYSTECTOMY  2005   [facility]  . COLONOSCOPY  2008   Dr. Donnella Sham  . EXCISION OF BREAST BIOPSY Left 1965   benign  . EYE SURGERY Bilateral    Cataract Extraction with IOL  . KYPHOPLASTY N/A 12/17/2016   Procedure: KYPHOPLASTY;  Surgeon: Hessie Knows, MD;  Location: [facility];  Service: Orthopedics;  Laterality: N/A;  l4  . VISCERAL ANGIOGRAPHY N/A 08/15/2017   Procedure: VISCERAL ANGIOGRAPHY;  Surgeon: Algernon Huxley, MD;  Location: [facility];  Service: Cardiovascular;  Laterality: N/A;  . VISCERAL ANGIOGRAPHY N/A 10/05/2017   Procedure: VISCERAL ANGIOGRAPHY;  Surgeon: Algernon Huxley, MD;  Location: [facility];  Service: Cardiovascular;  Laterality: N/A;  . VISCERAL ARTERY INTERVENTION N/A 08/15/2017   Procedure: VISCERAL ARTERY INTERVENTION;  Surgeon: Algernon Huxley, MD;  Location: [facility];  Service: Cardiovascular;  Laterality: N/A;    Social History     Social  History  Substance Use Topics  . Smoking status: Never Smoker  . Smokeless tobacco: Never Used  . Alcohol use No  NO IVDU  Family History      Family History  Problem Relation Age of Onset  . Heart disease Mother   . Hypertension Father   . Heart disease Brother 39   heart attack  . Cancer Brother    bladder  . Diabetes Sister   . Alcohol abuse Sister   . Kidney disease Sister   . Diabetes Brother   . Stroke Brother   . Diabetes Maternal Grandmother   . Heart disease Maternal Grandmother   . Cancer Brother    lung          Allergies  Allergen Reactions  . Ibandronic Acid Other (See Comments)    Achy all over. Flu like S/S  . Other Other (See Comments)    Achy all over. Flu like S/S Dysphagia  . Actonel [Risedronate] Other (See Comments)    Dysphagia Dysphagia dysphagia  . Raloxifene Other (See Comments)    Mood swings  . Versed [Midazolam] Other (See Comments)    Difficult waking up  . Doxycycline Rash  . Prednisone Rash     REVIEW OF SYSTEMS(Negative unless checked)  Constitutional: '[x]' Weight loss'[]' Fever'[]' Chills Cardiac:'[]' Chest pain'[]' Chest pressure'[x]' Palpitations '[]' Shortness of breath when laying flat '[]' Shortness of breath at rest '[]' Shortness of breath with exertion. Vascular: '[]' Pain in legs with walking'[]' Pain in legsat rest'[]' Pain in legs when laying flat '[]' Claudication '[]' Pain in feet when walking '[]' Pain in feet at rest '[]' Pain in feet when laying flat '[]' History of DVT '[]' Phlebitis '[]' Swelling in legs '[x]' Varicose veins '[]' Non-healing ulcers Pulmonary: '[]' Uses home oxygen '[]' Productive cough'[]' Hemoptysis '[]' Wheeze '[]' COPD '[]' Asthma Neurologic: '[]' Dizziness '[]' Blackouts '[]' Seizures '[]' History of stroke '[]' History of TIA'[]' Aphasia '[]' Temporary blindness'[]' Dysphagia '[]' Weaknessor numbness in arms '[]' Weakness or numbnessin legs Musculoskeletal: '[x]' Arthritis  '[]' Joint swelling '[]' Joint pain '[x]' Low back pain Hematologic:'[]' Easy bruising'[]' Easy bleeding '[]' Hypercoagulable state '[]' Anemic  Gastrointestinal:'[]' Blood in stool'[]' Vomiting blood'[x]' Gastroesophageal reflux/heartburn'[x]' Abdominal pain Genitourinary: '[x]' Chronic kidney disease '[]' Difficulturination '[]' Frequenturination '[]' Burning with urination'[]' Hematuria Skin: '[]' Rashes '[]' Ulcers '[]' Wounds Psychological: '[x]' History of anxiety'[]' History of major depression.      Physical Examination  There were no vitals taken for this visit. Gen:  WD/WN, NAD Head: [place]/AT, No temporalis wasting. Ear/Nose/Throat: Hearing grossly intact, nares w/o erythema or drainage, trachea midline Eyes: Conjunctiva clear. Sclera non-icteric Neck: Supple.  No JVD.  Pulmonary:  Good air movement, no use of accessory muscles.  Cardiac: RRR, normal S1, S2 Vascular: *** Vessel Right Left  Radial Palpable Palpable  Ulnar Palpable Palpable  Brachial Palpable  Palpable  Carotid Palpable, without bruit Palpable, without bruit  Aorta Not palpable N/A  Femoral Palpable Palpable  Popliteal Palpable Palpable  PT Palpable Palpable  DP Palpable Palpable   Gastrointestinal: soft, non-tender/non-distended. No guarding/reflex.  Musculoskeletal: M/S 5/5 throughout.  No deformity or atrophy. *** edema. Neurologic: Sensation grossly intact in extremities.  Symmetrical.  Speech is fluent.  Psychiatric: Judgment intact, Mood & affect appropriate for pt's clinical situation. Dermatologic: No rashes or ulcers noted.  No cellulitis or open wounds. Lymph : No Cervical, Axillary, or Inguinal lymphadenopathy.      Labs Recent Results (from the past 2160 hour(s))  BUN     Status: None   Collection Time: 10/05/17  7:37 AM  Result Value Ref Range   BUN 13 6 - 20 mg/dL  Creatinine, serum     Status: None   Collection Time: 10/05/17  7:37 AM  Result Value Ref Range   Creatinine, Ser 0.54 0.44 - 1.00  mg/dL   GFR calc non Af Amer >60 >60 mL/min   GFR calc Af Amer >60 >60 mL/min    Comment: (NOTE) The eGFR has been calculated using the CKD EPI equation. This calculation has not been validated in all clinical situations. eGFR's persistently <60 mL/min signify possible Chronic Kidney Disease.     Radiology No results found.    Assessment/Plan Hyperlipidemia lipid control important in reducing the progression of atherosclerotic disease.   Abdominal pain Unclear etiology   Chronic kidney disease, stage III (moderate) Given the need to limit contrast, we will start with noninvasive studies performed proceed with angiogram.   Varicose veins of leg with pain, right Recommend the compression stockings during daylight hours, leg elevation, and increasing her activity.  We have discussed the role of venous intervention for refractory symptomatic reflux.  A venous reflux study will be done at her convenience in the future.   No problem-specific Assessment & Plan notes found for this encounter.    Leotis Pain, MD  10/13/2017 3:53 PM    This note was created with Dragon medical transcription system.  Any errors from dictation are purely unintentional

## 2017-10-14 ENCOUNTER — Ambulatory Visit (INDEPENDENT_AMBULATORY_CARE_PROVIDER_SITE_OTHER): Payer: Medicare Other | Admitting: Vascular Surgery

## 2017-10-25 ENCOUNTER — Encounter (INDEPENDENT_AMBULATORY_CARE_PROVIDER_SITE_OTHER): Payer: Self-pay | Admitting: Vascular Surgery

## 2017-10-25 ENCOUNTER — Ambulatory Visit (INDEPENDENT_AMBULATORY_CARE_PROVIDER_SITE_OTHER): Payer: Medicare Other

## 2017-10-25 ENCOUNTER — Ambulatory Visit (INDEPENDENT_AMBULATORY_CARE_PROVIDER_SITE_OTHER): Payer: Medicare Other | Admitting: Vascular Surgery

## 2017-10-25 VITALS — BP 156/85 | HR 66 | Resp 16 | Ht 60.0 in | Wt 101.8 lb

## 2017-10-25 DIAGNOSIS — N183 Chronic kidney disease, stage 3 unspecified: Secondary | ICD-10-CM

## 2017-10-25 DIAGNOSIS — I83811 Varicose veins of right lower extremities with pain: Secondary | ICD-10-CM

## 2017-10-25 DIAGNOSIS — I774 Celiac artery compression syndrome: Secondary | ICD-10-CM | POA: Diagnosis not present

## 2017-10-25 DIAGNOSIS — E785 Hyperlipidemia, unspecified: Secondary | ICD-10-CM

## 2017-10-25 DIAGNOSIS — I771 Stricture of artery: Secondary | ICD-10-CM

## 2017-10-25 NOTE — Assessment & Plan Note (Signed)
Her venous duplex today reveals no evidence of DVT or superficial thrombophlebitis.  Venous reflux is identified in the right great saphenous vein, saphenofemoral junction, and right common femoral vein. Good discussion today about the options for treatment for her venous disease.  Given her symptoms, and given the findings of the above duplex, intervention in the form of laser ablation is certainly a reasonable she would like to continue to improve from her recent celiac intervention which.  She will wear compression stockings, elevate her legs, increase her activity, and return for a follow-up visit in about 3 months to assess her response to conservative management.

## 2017-10-25 NOTE — Progress Notes (Signed)
  MRN : [id number]  [name] is a 83 y.o. (05/03/1927) female who presents with chief complaint of  Chief Complaint  Patient presents with  . Follow-up    right leg ven reflux  .  History of Present Illness: Patient returns today in follow up of multiple issues.  She underwent celiac artery stent placement a couple of weeks ago.  This has resolved her reflux symptoms and helped her abdominal pain although not entirely resolved yet.  She has been eating better.  Her intervention was performed from a left brachial approach and that is healing well. Today, she is studying for her venous disease.  She is still having some pain and swelling in her right lower extremity.  Her prominent varicosities remain bothersome.  Her venous duplex today reveals no evidence of DVT or superficial thrombophlebitis.  Venous reflux is identified in the right great saphenous vein, saphenofemoral junction, and right common femoral vein.  Current Outpatient Medications  Medication Sig Dispense Refill  . acetaminophen (TYLENOL) 500 MG tablet Take 500 mg by mouth every 6 (six) hours as needed for mild pain.    . Ascorbic Acid (VITAMIN C CR) 500 MG CPCR Take 500 mg by mouth daily.    . aspirin EC 81 MG tablet Take 1 tablet (81 mg total) by mouth daily. (Patient taking differently: Take 81 mg by mouth 3 (three) times a week. ) 150 tablet 2  . atorvastatin (LIPITOR) 10 MG tablet Take 1 tablet (10 mg total) by mouth daily. (Patient taking differently: Take 10 mg by mouth daily at 6 PM. ) 30 tablet 11  . Brewers Yeast 487.5 MG TABS Take 1 tablet by mouth daily.     . CALCIUM-VITAMIN D-VITAMIN K PO Take 1 tablet by mouth daily.    . Cholecalciferol (VITAMIN D3) 3000 UNITS TABS Take 3,000 Units by mouth 2 (two) times daily.     . CINNAMON PO Take 1,000 mg by mouth daily.     . clopidogrel (PLAVIX) 75 MG tablet Take 1 tablet (75 mg total) by mouth daily. 30 tablet 11  . co-enzyme Q-10 30 MG capsule Take 30 mg by  mouth daily.     . Ginkgo Biloba (GNP GINGKO BILOBA EXTRACT PO) Take 1 tablet by mouth daily.    . Glucosamine Sulfate 500 MG CAPS Take 500 mg by mouth daily.    . ketotifen (ZADITOR) 0.025 % ophthalmic solution Place 1 drop into both eyes 2 (two) times daily.     . Lactobacillus (PROBIOTIC ACIDOPHILUS PO) Take 1 capsule by mouth daily.     . loratadine (CLARITIN) 10 MG tablet Take 10 mg by mouth daily as needed for allergies.    . Magnesium 400 MG CAPS Take 400 mg by mouth daily.    . Multiple Vitamin (MULTIVITAMIN) capsule Take 1 capsule by mouth daily.    . neomycin-bacitracin-polymyxin (NEOSPORIN) 5-400-5000 ointment Apply 1 application topically daily as needed (itching).     . Omega-3 Fatty Acids (OMEGA 3 PO) Take 520 mg by mouth daily.     . sodium chloride (OCEAN) 0.65 % SOLN nasal spray Place 1 spray into both nostrils every 4 (four) hours as needed for congestion.     . Turmeric 450 MG CAPS Take 450 Units by mouth.    . vitamin B-12 (CYANOCOBALAMIN) 1000 MCG tablet Take 1,000 mcg by mouth daily.     No current facility-administered medications for this visit.     Past Medical History:  Diagnosis   Date  . Arthritis   . Atherosclerosis   . Atherosclerosis of abdominal aorta ([place])   . Chronic kidney disease    stage 2  . CKD (chronic kidney disease) stage 2, GFR 60-89 ml/min   . Collagen vascular disease ([place])   . Diverticulosis 2008  . GERD (gastroesophageal reflux disease)   . Heart murmur   . Hyperlipidemia   . Lumbar spinal stenosis   . Osteoporosis   . Ovarian lump   . RA (rheumatoid arthritis) ([place])   . Raynaud's disease   . Rheumatoid arthritis ([place])   . Spinal stenosis     Past Surgical History:  Procedure Laterality Date  . BREAST SURGERY Left 1965   Benign biopsy  . CHOLECYSTECTOMY  2005   [place]  . COLONOSCOPY  2008   Dr. Donnella Sham  . EXCISION OF BREAST BIOPSY Left 1965   benign  . EYE SURGERY Bilateral    Cataract Extraction with IOL  .  KYPHOPLASTY N/A 12/17/2016   Procedure: KYPHOPLASTY;  Surgeon: Hessie Knows, MD;  Location: [facility];  Service: Orthopedics;  Laterality: N/A;  l4  . VISCERAL ANGIOGRAPHY N/A 08/15/2017   Procedure: VISCERAL ANGIOGRAPHY;  Surgeon: Algernon Huxley, MD;  Location: [place];  Service: Cardiovascular;  Laterality: N/A;  . VISCERAL ANGIOGRAPHY N/A 10/05/2017   Procedure: VISCERAL ANGIOGRAPHY;  Surgeon: Algernon Huxley, MD;  Location: [place] [facility];  Service: Cardiovascular;  Laterality: N/A;  . VISCERAL ARTERY INTERVENTION N/A 08/15/2017   Procedure: VISCERAL ARTERY INTERVENTION;  Surgeon: Algernon Huxley, MD;  Location: [facility] [facility];  Service: Cardiovascular;  Laterality: N/A;    Social History Social History   Tobacco Use  . Smoking status: Never Smoker  . Smokeless tobacco: Never Used  Substance Use Topics  . Alcohol use: No    Alcohol/week: 0.0 oz  . Drug use: No      Family History Family History  Problem Relation Age of Onset  . Heart disease Mother   . Hypertension Father   . Heart disease Brother 86       heart attack  . Cancer Brother        bladder  . Diabetes Sister   . Alcohol abuse Sister   . Kidney disease Sister   . Diabetes Brother   . Stroke Brother   . Diabetes Maternal Grandmother   . Heart disease Maternal Grandmother   . Cancer Brother        lung     Allergies  Allergen Reactions  . Ibandronic Acid Other (See Comments)    Achy all over. Flu like S/S  . Other Other (See Comments)    Achy all over. Flu like S/S Dysphagia  . Actonel [Risedronate] Other (See Comments)    Dysphagia  . Raloxifene Other (See Comments)    Mood swings  . Versed [Midazolam] Other (See Comments)    Difficult waking up and memory loss  . Doxycycline Rash  . Prednisone Rash     REVIEW OF SYSTEMS(Negative unless checked)  Constitutional: [x]Weight loss[]Fever[]Chills Cardiac:[]Chest pain[]Chest pressure[x]Palpitations []Shortness of  breath when laying flat []Shortness of breath at rest []Shortness of breath with exertion. Vascular: []Pain in legs with walking[]Pain in legsat rest[]Pain in legs when laying flat []Claudication []Pain in feet when walking []Pain in feet at rest []Pain in feet when laying flat []History of DVT []Phlebitis []Swelling in legs [x]Varicose veins []Non-healing ulcers Pulmonary: []Uses home oxygen []Productive cough[]Hemoptysis []Wheeze []COPD []Asthma Neurologic: []Dizziness []Blackouts []  Seizures []History of stroke []History of TIA[]Aphasia []Temporary blindness[]Dysphagia []Weaknessor numbness in arms []Weakness or numbnessin legs Musculoskeletal: [x]Arthritis []Joint swelling []Joint pain [x]Low back pain Hematologic:[]Easy bruising[]Easy bleeding []Hypercoagulable state []Anemic  Gastrointestinal:[]Blood in stool[]Vomiting blood[x]Gastroesophageal reflux/heartburn[x]Abdominal pain Genitourinary: [x]Chronic kidney disease []Difficulturination []Frequenturination []Burning with urination[]Hematuria Skin: []Rashes []Ulcers []Wounds Psychological: [x]History of anxiety[]History of major depression.     Physical Examination  BP (!) 156/85 (BP Location: Right Arm)   Pulse 66   Resp 16   Ht 5' (1.524 m)   Wt 46.2 kg (101 lb 12.8 oz)   BMI 19.88 kg/m  Gen:  This and frail appearing, does appear younger than stated age. Head: [place]/AT, + temporalis wasting. Ear/Nose/Throat: Hearing grossly intact, nares w/o erythema or drainage, trachea midline Eyes: Conjunctiva clear. Sclera non-icteric Neck: Supple.  No JVD.  Pulmonary:  Good air movement, no use of accessory muscles.  Cardiac: RRR, no JVD Vascular: Diffuse, enlarged varicosities throughout the right lower extremity with mild swelling present Vessel Right Left  Radial Palpable Palpable                                     Musculoskeletal: M/S 5/5 throughout.  No deformity or atrophy.  Mild right lower extremity edema. Neurologic: Sensation grossly intact in extremities.  Symmetrical.  Speech is fluent.  Psychiatric: Judgment intact, Mood & affect appropriate for pt's clinical situation. Dermatologic: No rashes or ulcers noted.  No cellulitis or open wounds.       Labs Recent Results (from the past 2160 hour(s))  BUN     Status: None   Collection Time: 10/05/17  7:37 AM  Result Value Ref Range   BUN 13 6 - 20 mg/dL  Creatinine, serum     Status: None   Collection Time: 10/05/17  7:37 AM  Result Value Ref Range   Creatinine, Ser 0.54 0.44 - 1.00 mg/dL   GFR calc non Af Amer >60 >60 mL/min   GFR calc Af Amer >60 >60 mL/min    Comment: (NOTE) The eGFR has been calculated using the CKD EPI equation. This calculation has not been validated in all clinical situations. eGFR's persistently <60 mL/min signify possible Chronic Kidney Disease.     Radiology No results found.    Assessment/Plan Hyperlipidemia lipid control important in reducing the progression of atherosclerotic disease.   Abdominal pain This is improved after her celiac stent placement.  It has not entirely resolved and I am not sure this was the complete cause of her symptoms.  She is scheduled to come back and have her celiac stent checked next month.  Chronic kidney disease, stage III (moderate) Given the need to limit contrast, we will be cautious with any procedures.   Varicose veins of leg with pain, right  Her venous duplex today reveals no evidence of DVT or superficial thrombophlebitis.  Venous reflux is identified in the right great saphenous vein, saphenofemoral junction, and right common femoral vein. Good discussion today about the options for treatment for her venous disease.  Given her symptoms, and given the findings of the above duplex, intervention in the form of laser ablation is certainly a reasonable  she would like to continue to improve from her recent celiac intervention which.  She will wear compression stockings, elevate her legs, increase her activity, and return for a follow-up visit in about 3 months to assess her response to conservative management.  Jason Dew, MD  10/25/2017 5:25 PM    This note was created with Dragon medical transcription system.  Any errors from dictation are purely unintentional 

## 2017-10-25 NOTE — Patient Instructions (Signed)
Varicose Veins Varicose veins are veins that have become enlarged and twisted. They are usually seen in the legs but can occur in other parts of the body as well. What are the causes? This condition is the result of valves in the veins not working properly. Valves in the veins help to return blood from the leg to the heart. If these valves are damaged, blood flows backward and backs up into the veins in the leg near the skin. This causes the veins to become larger. What increases the risk? People who are on their feet a lot, who are pregnant, or who are overweight are more likely to develop varicose veins. What are the signs or symptoms?  Bulging, twisted-appearing, bluish veins, most commonly found on the legs.  Leg pain or a feeling of heaviness. These symptoms may be worse at the end of the day.  Leg swelling.  Changes in skin color. How is this diagnosed? A health care provider can usually diagnose varicose veins by examining your legs. Your health care provider may also recommend an ultrasound of your leg veins. How is this treated? Most varicose veins can be treated at home.However, other treatments are available for people who have persistent symptoms or want to improve the cosmetic appearance of the varicose veins. These treatment options include:  Sclerotherapy. A solution is injected into the vein to close it off.  Laser treatment. A laser is used to heat the vein to close it off.  Radiofrequency vein ablation. An electrical current produced by radio waves is used to close off the vein.  Phlebectomy. The vein is surgically removed through small incisions made over the varicose vein.  Vein ligation and stripping. The vein is surgically removed through incisions made over the varicose vein after the vein has been tied (ligated). Follow these instructions at home:   Do not stand or sit in one position for long periods of time. Do not sit with your legs crossed. Rest with your  legs raised during the day.  Wear compression stockings as directed by your health care provider. These stockings help to prevent blood clots and reduce swelling in your legs.  Do not wear other tight, encircling garments around your legs, pelvis, or waist.  Walk as much as possible to increase blood flow.  Raise the foot of your bed at night with 2-inch blocks.  If you get a cut in the skin over the vein and the vein bleeds, lie down with your leg raised and press on it with a clean cloth until the bleeding stops. Then place a bandage (dressing) on the cut. See your health care provider if it continues to bleed. Contact a health care provider if:  The skin around your ankle starts to break down.  You have pain, redness, tenderness, or hard swelling in your leg over a vein.  You are uncomfortable because of leg pain. This information is not intended to replace advice given to you by your health care provider. Make sure you discuss any questions you have with your health care provider. Document Released: 08/04/2005 Document Revised: 04/01/2016 Document Reviewed: 04/27/2016 Elsevier Interactive Patient Education  2017 [employer]  

## 2017-11-04 ENCOUNTER — Other Ambulatory Visit (INDEPENDENT_AMBULATORY_CARE_PROVIDER_SITE_OTHER): Payer: Self-pay | Admitting: Vascular Surgery

## 2017-11-04 DIAGNOSIS — I774 Celiac artery compression syndrome: Secondary | ICD-10-CM

## 2017-11-04 DIAGNOSIS — K551 Chronic vascular disorders of intestine: Secondary | ICD-10-CM

## 2017-11-04 DIAGNOSIS — I771 Stricture of artery: Secondary | ICD-10-CM

## 2017-11-09 ENCOUNTER — Encounter (INDEPENDENT_AMBULATORY_CARE_PROVIDER_SITE_OTHER): Payer: Self-pay | Admitting: Vascular Surgery

## 2017-11-09 ENCOUNTER — Ambulatory Visit (INDEPENDENT_AMBULATORY_CARE_PROVIDER_SITE_OTHER): Payer: Medicare Other | Admitting: Vascular Surgery

## 2017-11-09 ENCOUNTER — Ambulatory Visit (INDEPENDENT_AMBULATORY_CARE_PROVIDER_SITE_OTHER): Payer: Medicare Other

## 2017-11-09 VITALS — BP 150/71 | HR 63 | Resp 15 | Ht 60.0 in | Wt 100.0 lb

## 2017-11-09 DIAGNOSIS — I771 Stricture of artery: Secondary | ICD-10-CM

## 2017-11-09 DIAGNOSIS — E785 Hyperlipidemia, unspecified: Secondary | ICD-10-CM

## 2017-11-09 DIAGNOSIS — R739 Hyperglycemia, unspecified: Secondary | ICD-10-CM | POA: Diagnosis not present

## 2017-11-09 DIAGNOSIS — I774 Celiac artery compression syndrome: Secondary | ICD-10-CM

## 2017-11-09 DIAGNOSIS — K551 Chronic vascular disorders of intestine: Secondary | ICD-10-CM | POA: Diagnosis not present

## 2017-11-09 NOTE — Progress Notes (Signed)
Subjective:    Patient ID: [name], female    DOB: 12-Feb-1927, 84 y.o.   MRN: [id number] Chief Complaint  Patient presents with  . Follow-up    5 week follow up   Patient presents for her first post procedure follow-up.  The patient is status post a celiac artery stent placement on September 27, 2017.  The patient presents today without complaint with the exception of some lower back pain.  The patient denies any abdominal pain, postprandial pain, nausea, or vomiting.  The patient underwent a mesenteric artery duplex exam which was notable for a patent celiac artery stent without evidence of restenosis.  Patent superior mesenteric artery and inferior mesenteric artery without evidence of stenosis.  When compared to the previous exam on August 05, 2017 there has been a vascular intervention with improvement of arterial flow.  The patient denies any fever, nausea vomiting.   Review of Systems  Constitutional: Negative.   HENT: Negative.   Eyes: Negative.   Respiratory: Negative.   Cardiovascular: Negative.   Gastrointestinal: Negative.   Endocrine: Negative.   Genitourinary: Negative.   Musculoskeletal: Negative.   Skin: Negative.   Allergic/Immunologic: Negative.   Neurological: Negative.   Hematological: Negative.   Psychiatric/Behavioral: Negative.       Objective:   Physical Exam  Constitutional: She is oriented to person, place, and time. She appears well-developed and well-nourished. No distress.  HENT:  Head: Normocephalic and atraumatic.  Eyes: Conjunctivae are normal. Pupils are equal, round, and reactive to light.  Neck: Normal range of motion.  Cardiovascular: Normal rate, regular rhythm, normal heart sounds and intact distal pulses.  Pulses:      Radial pulses are 2+ on the right side, and 2+ on the left side.  Pulmonary/Chest: Effort normal and breath sounds normal.  Abdominal: Soft. Bowel sounds are normal. She exhibits no distension. There is no  tenderness. There is no rebound.  Musculoskeletal: Normal range of motion. She exhibits no edema.  Neurological: She is alert and oriented to person, place, and time.  Skin: Skin is warm and dry. She is not diaphoretic.  Psychiatric: She has a normal mood and affect. Her behavior is normal. Judgment and thought content normal.  Vitals reviewed.  BP (!) 150/71 (BP Location: Right Arm, Patient Position: Sitting)   Pulse 63   Resp 15   Ht 5' (1.524 m)   Wt 100 lb (45.4 kg)   BMI 19.53 kg/m   Past Medical History:  Diagnosis Date  . Arthritis   . Atherosclerosis   . Atherosclerosis of abdominal aorta ([place])   . Chronic kidney disease    stage 2  . CKD (chronic kidney disease) stage 2, GFR 60-89 ml/min   . Collagen vascular disease ([place])   . Diverticulosis 2008  . GERD (gastroesophageal reflux disease)   . Heart murmur   . Hyperlipidemia   . Lumbar spinal stenosis   . Osteoporosis   . Ovarian lump   . RA (rheumatoid arthritis) ([place])   . Raynaud's disease   . Rheumatoid arthritis ([place])   . Spinal stenosis    Social History   Socioeconomic History  . Marital status: Widowed    Spouse name: Not on file  . Number of children: Not on file  . Years of education: Not on file  . Highest education level: Not on file  Social Needs  . Financial resource strain: Not on file  . Food insecurity - worry: Not on file  .  Food insecurity - inability: Not on file  . Transportation needs - medical: Not on file  . Transportation needs - non-medical: Not on file  Occupational History  . Not on file  Tobacco Use  . Smoking status: Never Smoker  . Smokeless tobacco: Never Used  Substance and Sexual Activity  . Alcohol use: No    Alcohol/week: 0.0 oz  . Drug use: No  . Sexual activity: Not on file  Other Topics Concern  . Not on file  Social History Narrative  . Not on file   Past Surgical History:  Procedure Laterality Date  . BREAST SURGERY Left 1965   Benign biopsy  .  CHOLECYSTECTOMY  2005   [place]  . COLONOSCOPY  2008   Dr. Donnella Sham  . EXCISION OF BREAST BIOPSY Left 1965   benign  . EYE SURGERY Bilateral    Cataract Extraction with IOL  . KYPHOPLASTY N/A 12/17/2016   Procedure: KYPHOPLASTY;  Surgeon: Hessie Knows, MD;  Location: [facility];  Service: Orthopedics;  Laterality: N/A;  l4  . VISCERAL ANGIOGRAPHY N/A 08/15/2017   Procedure: VISCERAL ANGIOGRAPHY;  Surgeon: Algernon Huxley, MD;  Location: [facility];  Service: Cardiovascular;  Laterality: N/A;  . VISCERAL ANGIOGRAPHY N/A 10/05/2017   Procedure: VISCERAL ANGIOGRAPHY;  Surgeon: Algernon Huxley, MD;  Location: [facility] [facility];  Service: Cardiovascular;  Laterality: N/A;  . VISCERAL ARTERY INTERVENTION N/A 08/15/2017   Procedure: VISCERAL ARTERY INTERVENTION;  Surgeon: Algernon Huxley, MD;  Location: [place] [facility];  Service: Cardiovascular;  Laterality: N/A;   Family History  Problem Relation Age of Onset  . Heart disease Mother   . Hypertension Father   . Heart disease Brother 67       heart attack  . Cancer Brother        bladder  . Diabetes Sister   . Alcohol abuse Sister   . Kidney disease Sister   . Diabetes Brother   . Stroke Brother   . Diabetes Maternal Grandmother   . Heart disease Maternal Grandmother   . Cancer Brother        lung   Allergies  Allergen Reactions  . Ibandronic Acid Other (See Comments)    Achy all over. Flu like S/S  . Other Other (See Comments)    Achy all over. Flu like S/S Dysphagia  . Actonel [Risedronate] Other (See Comments)    Dysphagia  . Raloxifene Other (See Comments)    Mood swings  . Versed [Midazolam] Other (See Comments)    Difficult waking up and memory loss  . Doxycycline Rash  . Prednisone Rash      Assessment & Plan:  Patient presents for her first post procedure follow-up.  The patient is status post a celiac artery stent placement on September 27, 2017.  The patient presents today without complaint with the  exception of some lower back pain.  The patient denies any abdominal pain, postprandial pain, nausea, or vomiting.  The patient underwent a mesenteric artery duplex exam which was notable for a patent celiac artery stent without evidence of restenosis.  Patent superior mesenteric artery and inferior mesenteric artery without evidence of stenosis.  When compared to the previous exam on August 05, 2017 there has been a vascular intervention with improvement of arterial flow.  The patient denies any fever, nausea vomiting.  1. Celiac artery stenosis (HCC) - Stable There has been improvement in the patient's symptoms status post her September 27, 2017 celiac  artery stent intervention There has been improvement in arterial flow noted on today's mesenteric artery duplex exam There is no indication for intervention at this time The patient is to follow-up in 3 months and undergo a mesenteric artery duplex exam The patient is to call the office sooner if she should experience any abdominal pain, postprandial pain nausea vomiting.  - VAS [place] MESENTERIC DUPLEX; Future  2. Hyperlipidemia, unspecified hyperlipidemia type - Stable Encouraged good control as its slows the progression of atherosclerotic disease  3. Hyperglycemia - Stable Encouraged good control as its slows the progression of atherosclerotic disease  Current Outpatient Medications on File Prior to Visit  Medication Sig Dispense Refill  . acetaminophen (TYLENOL) 500 MG tablet Take 500 mg by mouth every 6 (six) hours as needed for mild pain.    . Ascorbic Acid (VITAMIN C CR) 500 MG CPCR Take 500 mg by mouth daily.    [name] aspirin EC 81 MG tablet Take 1 tablet (81 mg total) by mouth daily. (Patient taking differently: Take 81 mg by mouth 3 (three) times a week. ) 150 tablet 2  . atorvastatin (LIPITOR) 10 MG tablet Take 1 tablet (10 mg total) by mouth daily. (Patient taking differently: Take 10 mg by mouth daily at 6 PM. ) 30 tablet 11  . Brewers  Yeast 487.5 MG TABS Take 1 tablet by mouth daily.     . Calcium Carbonate-Vitamin D 600-200 MG-UNIT CAPS Take by mouth.    . Cholecalciferol (VITAMIN D3) 3000 UNITS TABS Take 3,000 Units by mouth 2 (two) times daily.     [name] CINNAMON PO Take 1,000 mg by mouth daily.     . clopidogrel (PLAVIX) 75 MG tablet Take 1 tablet (75 mg total) by mouth daily. 30 tablet 11  . co-enzyme Q-10 30 MG capsule Take 30 mg by mouth daily.     . Ginkgo Biloba (GNP GINGKO BILOBA EXTRACT PO) Take 1 tablet by mouth daily.    . Glucosamine Sulfate 500 MG CAPS Take 500 mg by mouth daily.    [name] ketotifen (ZADITOR) 0.025 % ophthalmic solution Place 1 drop into both eyes 2 (two) times daily.     . Lactobacillus (PROBIOTIC ACIDOPHILUS PO) Take 1 capsule by mouth daily.     [name] loratadine (CLARITIN) 10 MG tablet Take 10 mg by mouth daily as needed for allergies.    . Magnesium 400 MG CAPS Take 400 mg by mouth daily.    . Multiple Vitamin (MULTIVITAMIN) capsule Take 1 capsule by mouth daily.    [name] neomycin-bacitracin-polymyxin (NEOSPORIN) [phone] ointment Apply 1 application topically daily as needed (itching).     . Omega-3 Fatty Acids (OMEGA 3 PO) Take 520 mg by mouth daily.     . sodium chloride (OCEAN) 0.65 % SOLN nasal spray Place 1 spray into both nostrils every 4 (four) hours as needed for congestion.     . traMADol (ULTRAM) 50 MG tablet Take by mouth.    . Turmeric 450 MG CAPS Take 450 Units by mouth.    . vitamin B-12 (CYANOCOBALAMIN) 1000 MCG tablet Take 1,000 mcg by mouth daily.     No current facility-administered medications on file prior to visit.    There are no Patient Instructions on file for this visit. No Follow-up on file.  KIMBERLY A STEGMAYER, PA-C

## 2017-12-23 ENCOUNTER — Ambulatory Visit (INDEPENDENT_AMBULATORY_CARE_PROVIDER_SITE_OTHER): Payer: Medicare Other | Admitting: Vascular Surgery

## 2017-12-23 ENCOUNTER — Encounter (INDEPENDENT_AMBULATORY_CARE_PROVIDER_SITE_OTHER): Payer: Self-pay | Admitting: Vascular Surgery

## 2017-12-23 VITALS — BP 138/80 | HR 71 | Resp 13 | Ht 60.0 in | Wt 103.0 lb

## 2017-12-23 DIAGNOSIS — I83811 Varicose veins of right lower extremities with pain: Secondary | ICD-10-CM

## 2017-12-23 DIAGNOSIS — N183 Chronic kidney disease, stage 3 unspecified: Secondary | ICD-10-CM

## 2017-12-23 DIAGNOSIS — I700 Atherosclerosis of aorta: Secondary | ICD-10-CM | POA: Diagnosis not present

## 2017-12-23 DIAGNOSIS — E785 Hyperlipidemia, unspecified: Secondary | ICD-10-CM

## 2017-12-23 DIAGNOSIS — I771 Stricture of artery: Secondary | ICD-10-CM

## 2017-12-23 DIAGNOSIS — I774 Celiac artery compression syndrome: Secondary | ICD-10-CM

## 2017-12-23 NOTE — Progress Notes (Signed)
MRN : [id number]  [name] is a 84 y.o. (Jul 16, 1927) female who presents with chief complaint of  Chief Complaint  Patient presents with  . Follow-up    Uncomfortable stent  .  History of Present Illness: Patient returns today in follow up prior to her scheduled follow-up visit with a litany of somatic complaints.  She is complaining of more abdominal pain.  She describes an episode where she leaned into the kitchen counter and has had continued abdominal pain since that time.  She is concerned that could be a problem with her stent.  She had a celiac artery stent placed several months ago now.  Initially, she had improvement in her abdominal pain and was feeling better.  She complains of poor energy and appetite.  She is also complaining of more pain in her legs particularly with activity.  She denies new ulceration or infection.  No fever or chills.  She says that she just feels very worn down and tired over the past few weeks.  Current Outpatient Medications  Medication Sig Dispense Refill  . acetaminophen (TYLENOL) 500 MG tablet Take 500 mg by mouth every 6 (six) hours as needed for mild pain.    . Ascorbic Acid (VITAMIN C CR) 500 MG CPCR Take 500 mg by mouth daily.    [name] aspirin EC 81 MG tablet Take 1 tablet (81 mg total) by mouth daily. (Patient taking differently: Take 81 mg by mouth 3 (three) times a week. ) 150 tablet 2  . atorvastatin (LIPITOR) 10 MG tablet Take 1 tablet (10 mg total) by mouth daily. (Patient taking differently: Take 10 mg by mouth daily at 6 PM. ) 30 tablet 11  . Brewers Yeast 487.5 MG TABS Take 1 tablet by mouth daily.     . Calcium Carbonate-Vitamin D 600-200 MG-UNIT CAPS Take by mouth.    . Cholecalciferol (VITAMIN D3) 3000 UNITS TABS Take 3,000 Units by mouth 2 (two) times daily.     [name] CINNAMON PO Take 1,000 mg by mouth daily.     . clopidogrel (PLAVIX) 75 MG tablet Take 1 tablet (75 mg total) by mouth daily. 30 tablet 11  . co-enzyme Q-10 30 MG  capsule Take 30 mg by mouth daily.     . Ginkgo Biloba (GNP GINGKO BILOBA EXTRACT PO) Take 1 tablet by mouth daily.    . Glucosamine Sulfate 500 MG CAPS Take 500 mg by mouth daily.    [name] ketotifen (ZADITOR) 0.025 % ophthalmic solution Place 1 drop into both eyes 2 (two) times daily.     . Lactobacillus (PROBIOTIC ACIDOPHILUS PO) Take 1 capsule by mouth daily.     [name] loratadine (CLARITIN) 10 MG tablet Take 10 mg by mouth daily as needed for allergies.    . Magnesium 400 MG CAPS Take 400 mg by mouth daily.    . Multiple Vitamin (MULTIVITAMIN) capsule Take 1 capsule by mouth daily.    [name] neomycin-bacitracin-polymyxin (NEOSPORIN) [phone] ointment Apply 1 application topically daily as needed (itching).     . Omega-3 Fatty Acids (OMEGA 3 PO) Take 520 mg by mouth daily.     . sodium chloride (OCEAN) 0.65 % SOLN nasal spray Place 1 spray into both nostrils every 4 (four) hours as needed for congestion.     . traMADol (ULTRAM) 50 MG tablet Take by mouth.    . Turmeric 450 MG CAPS Take 450 Units by mouth.    . vitamin B-12 (CYANOCOBALAMIN) 1000 MCG tablet  Take 1,000 mcg by mouth daily.     No current facility-administered medications for this visit.     Past Medical History:  Diagnosis Date  . Arthritis   . Atherosclerosis   . Atherosclerosis of abdominal aorta ([place])   . Chronic kidney disease    stage 2  . CKD (chronic kidney disease) stage 2, GFR 60-89 ml/min   . Collagen vascular disease ([place])   . Diverticulosis 2008  . GERD (gastroesophageal reflux disease)   . Heart murmur   . Hyperlipidemia   . Lumbar spinal stenosis   . Osteoporosis   . Ovarian lump   . RA (rheumatoid arthritis) ([place])   . Raynaud's disease   . Rheumatoid arthritis ([place])   . Spinal stenosis     Past Surgical History:  Procedure Laterality Date  . BREAST SURGERY Left 1965   Benign biopsy  . CHOLECYSTECTOMY  2005   [place]  . COLONOSCOPY  2008   Dr. Donnella Sham  . EXCISION OF BREAST BIOPSY Left 1965    benign  . EYE SURGERY Bilateral    Cataract Extraction with IOL  . KYPHOPLASTY N/A 12/17/2016   Procedure: KYPHOPLASTY;  Surgeon: Hessie Knows, MD;  Location: [facility];  Service: Orthopedics;  Laterality: N/A;  l4  . VISCERAL ANGIOGRAPHY N/A 08/15/2017   Procedure: VISCERAL ANGIOGRAPHY;  Surgeon: Algernon Huxley, MD;  Location: [place] [facility];  Service: Cardiovascular;  Laterality: N/A;  . VISCERAL ANGIOGRAPHY N/A 10/05/2017   Procedure: VISCERAL ANGIOGRAPHY;  Surgeon: Algernon Huxley, MD;  Location: [place] [facility];  Service: Cardiovascular;  Laterality: N/A;  . VISCERAL ARTERY INTERVENTION N/A 08/15/2017   Procedure: VISCERAL ARTERY INTERVENTION;  Surgeon: Algernon Huxley, MD;  Location: [facility] [place] [facility];  Service: Cardiovascular;  Laterality: N/A;    Social History Social History   Tobacco Use  . Smoking status: Never Smoker  . Smokeless tobacco: Never Used  Substance Use Topics  . Alcohol use: No    Alcohol/week: 0.0 oz  . Drug use: No    Family History Family History  Problem Relation Age of Onset  . Heart disease Mother   . Hypertension Father   . Heart disease Brother 1       heart attack  . Cancer Brother        bladder  . Diabetes Sister   . Alcohol abuse Sister   . Kidney disease Sister   . Diabetes Brother   . Stroke Brother   . Diabetes Maternal Grandmother   . Heart disease Maternal Grandmother   . Cancer Brother        lung    Allergies  Allergen Reactions  . Ibandronic Acid Other (See Comments)    Achy all over. Flu like S/S  . Other Other (See Comments)    Achy all over. Flu like S/S Dysphagia  . Actonel [Risedronate] Other (See Comments)    Dysphagia  . Raloxifene Other (See Comments)    Mood swings  . Versed [Midazolam] Other (See Comments)    Difficult waking up and memory loss  . Doxycycline Rash  . Prednisone Rash    REVIEW OF SYSTEMS(Negative unless checked)  Constitutional: [x]Weight loss[]Fever[]Chills Cardiac:[]Chest  pain[]Chest pressure[x]Palpitations []Shortness of breath when laying flat []Shortness of breath at rest []Shortness of breath with exertion. Vascular: []Pain in legs with walking[]Pain in legsat rest[]Pain in legs when laying flat []Claudication []Pain in feet when walking []Pain in feet at rest []Pain in feet when laying flat []History  of DVT []Phlebitis []Swelling in legs [x]Varicose veins []Non-healing ulcers Pulmonary: []Uses home oxygen []Productive cough[]Hemoptysis []Wheeze []COPD []Asthma Neurologic: []Dizziness []Blackouts []Seizures []History of stroke []History of TIA[]Aphasia []Temporary blindness[]Dysphagia []Weaknessor numbness in arms []Weakness or numbnessin legs Musculoskeletal: [x]Arthritis []Joint swelling []Joint pain [x]Low back pain Hematologic:[]Easy bruising[]Easy bleeding []Hypercoagulable state []Anemic  Gastrointestinal:[]Blood in stool[]Vomiting blood[x]Gastroesophageal reflux/heartburn[x]Abdominal pain Genitourinary: [x]Chronic kidney disease []Difficulturination []Frequenturination []Burning with urination[]Hematuria Skin: []Rashes []Ulcers []Wounds Psychological: [x]History of anxiety[]History of major depression.    Physical Examination  BP 138/80 (BP Location: Right Arm, Patient Position: Sitting)   Pulse 71   Resp 13   Ht 5' (1.524 m)   Wt 46.7 kg (103 lb)   BMI 20.12 kg/m  Gen:  WD/WN, NAD.  Appears younger than stated age Head: [place]/AT, No temporalis wasting. Ear/Nose/Throat: Hearing grossly intact, nares w/o erythema or drainage, trachea midline Eyes: Conjunctiva clear. Sclera non-icteric Neck: Supple.  No JVD.  Pulmonary:  Good air movement, no use of accessory muscles.  Cardiac: RRR, normal S1, S2 Vascular:  Vessel Right Left  Radial Palpable Palpable                          PT  1+ palpable  1+ palpable  DP  1+ palpable  Palpable   Gastrointestinal: soft, non-tender/non-distended. No guarding/reflex.  Musculoskeletal: M/S 5/5 throughout.  No deformity or atrophy.  Mild lower extremity edema. Neurologic: Sensation grossly intact in extremities.  Symmetrical.  Speech is fluent.  Psychiatric: Judgment intact, insight is good.  Affect is somewhat anxious Dermatologic: No rashes or ulcers noted.  No cellulitis or open wounds.       Labs Recent Results (from the past 2160 hour(s))  BUN     Status: None   Collection Time: 10/05/17  7:37 AM  Result Value Ref Range   BUN 13 6 - 20 mg/dL  Creatinine, serum     Status: None   Collection Time: 10/05/17  7:37 AM  Result Value Ref Range   Creatinine, Ser 0.54 0.44 - 1.00 mg/dL   GFR calc non Af Amer >60 >60 mL/min   GFR calc Af Amer >60 >60 mL/min    Comment: (NOTE) The eGFR has been calculated using the CKD EPI equation. This calculation has not been validated in all clinical situations. eGFR's persistently <60 mL/min signify possible Chronic Kidney Disease.     Radiology No results found.   Assessment/Plan Hyperlipidemia lipid control important in reducing the progression of atherosclerotic disease.   Abdominal pain This is improved after her celiac stent placement now she is complaining of more vague abdominal pain.  This is not necessarily related to eating I will recheck her celiac artery stent although I do not think that is likely the cause of her current symptoms.  Chronic kidney disease, stage III (moderate) Given the need to limit contrast, we will be cautious with any procedures.   Varicose veins of leg with pain, right Still wearing stockings and elevating her legs.  We will consider intervention in the months going forward if her symptoms do not improve.  She is complaining of more leg pain today.  I think it would also be reasonable to check ABIs to make sure arterial insufficiency is not present.  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  Seem like her symptoms had improved although she is now complaining more.  Recheck the stent with duplex.    Leotis Pain, MD  12/23/2017 1:46 PM  This note was created with Dragon medical transcription system.  Any errors from dictation are purely unintentional

## 2017-12-23 NOTE — Assessment & Plan Note (Signed)
Status post stent placement.  Seem like her symptoms had improved although she is now complaining more.  Recheck the stent with duplex.

## 2018-01-04 ENCOUNTER — Ambulatory Visit (INDEPENDENT_AMBULATORY_CARE_PROVIDER_SITE_OTHER): Payer: Medicare Other

## 2018-01-04 DIAGNOSIS — I774 Celiac artery compression syndrome: Secondary | ICD-10-CM

## 2018-01-04 DIAGNOSIS — I771 Stricture of artery: Secondary | ICD-10-CM

## 2018-01-04 DIAGNOSIS — I700 Atherosclerosis of aorta: Secondary | ICD-10-CM

## 2018-01-17 ENCOUNTER — Ambulatory Visit (INDEPENDENT_AMBULATORY_CARE_PROVIDER_SITE_OTHER): Payer: Medicare Other | Admitting: Vascular Surgery

## 2018-01-17 ENCOUNTER — Encounter (INDEPENDENT_AMBULATORY_CARE_PROVIDER_SITE_OTHER): Payer: Self-pay | Admitting: Vascular Surgery

## 2018-01-17 VITALS — BP 130/66 | HR 69 | Resp 16 | Wt 104.8 lb

## 2018-01-17 DIAGNOSIS — I83811 Varicose veins of right lower extremities with pain: Secondary | ICD-10-CM

## 2018-01-17 DIAGNOSIS — I774 Celiac artery compression syndrome: Secondary | ICD-10-CM

## 2018-01-17 DIAGNOSIS — R1031 Right lower quadrant pain: Secondary | ICD-10-CM | POA: Diagnosis not present

## 2018-01-17 DIAGNOSIS — E785 Hyperlipidemia, unspecified: Secondary | ICD-10-CM

## 2018-01-17 DIAGNOSIS — N183 Chronic kidney disease, stage 3 unspecified: Secondary | ICD-10-CM

## 2018-01-17 DIAGNOSIS — I771 Stricture of artery: Secondary | ICD-10-CM

## 2018-01-17 NOTE — Progress Notes (Signed)
MRN : [id number]  [name] is a 84 y.o. (1927-10-26) female who presents with chief complaint of  Chief Complaint  Patient presents with  . Follow-up    ultrasound results  .  History of Present Illness: Patient returns today in follow up.  She still complains of abdominal soreness.  She says she has good days and bad days.  Overall she thinks she is eating fairly well.  She still feels tired and weak and feels like her legs give out on her.  Since her last visit, she has undergone noninvasive arterial studies of the lower extremities which showed completely normal triphasic waveforms with normal ABIs consistent with no significant lower extremity arterial insufficiency.  Her mesenteric duplex shows a widely patent celiac artery stent with no evidence of restenosis and a patent SMA and IMA.  Current Outpatient Medications  Medication Sig Dispense Refill  . acetaminophen (TYLENOL) 500 MG tablet Take 500 mg by mouth every 6 (six) hours as needed for mild pain.    . Ascorbic Acid (VITAMIN C CR) 500 MG CPCR Take 500 mg by mouth daily.    [name] aspirin EC 81 MG tablet Take 1 tablet (81 mg total) by mouth daily. (Patient taking differently: Take 81 mg by mouth 3 (three) times a week. ) 150 tablet 2  . atorvastatin (LIPITOR) 10 MG tablet Take 1 tablet (10 mg total) by mouth daily. (Patient taking differently: Take 10 mg by mouth daily at 6 PM. ) 30 tablet 11  . Brewers Yeast 487.5 MG TABS Take 1 tablet by mouth daily.     . Calcium Carbonate-Vitamin D 600-200 MG-UNIT CAPS Take by mouth.    . Cholecalciferol (VITAMIN D3) 3000 UNITS TABS Take 3,000 Units by mouth 2 (two) times daily.     [name] CINNAMON PO Take 1,000 mg by mouth daily.     . clopidogrel (PLAVIX) 75 MG tablet Take 1 tablet (75 mg total) by mouth daily. 30 tablet 11  . co-enzyme Q-10 30 MG capsule Take 30 mg by mouth daily.     . Ginkgo Biloba (GNP GINGKO BILOBA EXTRACT PO) Take 1 tablet by mouth daily.    .  Glucosamine Sulfate 500 MG CAPS Take 500 mg by mouth daily.    [name] ketotifen (ZADITOR) 0.025 % ophthalmic solution Place 1 drop into both eyes 2 (two) times daily.     . Lactobacillus (PROBIOTIC ACIDOPHILUS PO) Take 1 capsule by mouth daily.     [name] loratadine (CLARITIN) 10 MG tablet Take 10 mg by mouth daily as needed for allergies.    . Magnesium 400 MG CAPS Take 400 mg by mouth daily.    . Multiple Vitamin (MULTIVITAMIN) capsule Take 1 capsule by mouth daily.    [name] neomycin-bacitracin-polymyxin (NEOSPORIN) [phone] ointment Apply 1 application topically daily as needed (itching).     . Omega-3 Fatty Acids (OMEGA 3 PO) Take 520 mg by mouth daily.     . sodium chloride (OCEAN) 0.65 % SOLN nasal spray Place 1 spray into both nostrils every 4 (four) hours as needed for congestion.     . traMADol (ULTRAM) 50 MG tablet Take by mouth.    . Turmeric 450 MG CAPS Take 450 Units by mouth.    . vitamin B-12 (CYANOCOBALAMIN) 1000 MCG tablet Take 1,000 mcg by mouth daily.     No current facility-administered medications for this visit.         Past Medical History:  Diagnosis Date  .  Arthritis   . Atherosclerosis   . Atherosclerosis of abdominal aorta ([place])   . Chronic kidney disease    stage 2  . CKD (chronic kidney disease) stage 2, GFR 60-89 ml/min   . Collagen vascular disease ([place])   . Diverticulosis 2008  . GERD (gastroesophageal reflux disease)   . Heart murmur   . Hyperlipidemia   . Lumbar spinal stenosis   . Osteoporosis   . Ovarian lump   . RA (rheumatoid arthritis) ([place])   . Raynaud's disease   . Rheumatoid arthritis ([place])   . Spinal stenosis          Past Surgical History:  Procedure Laterality Date  . BREAST SURGERY Left 1965   Benign biopsy  . CHOLECYSTECTOMY  2005   [place]  . COLONOSCOPY  2008   Dr. Donnella Sham  . EXCISION OF BREAST BIOPSY Left 1965   benign  . EYE SURGERY Bilateral    Cataract Extraction with  IOL  . KYPHOPLASTY N/A 12/17/2016   Procedure: KYPHOPLASTY;  Surgeon: Hessie Knows, MD;  Location: [facility];  Service: Orthopedics;  Laterality: N/A;  l4  . VISCERAL ANGIOGRAPHY N/A 08/15/2017   Procedure: VISCERAL ANGIOGRAPHY;  Surgeon: Algernon Huxley, MD;  Location: [place] [facility];  Service: Cardiovascular;  Laterality: N/A;  . VISCERAL ANGIOGRAPHY N/A 10/05/2017   Procedure: VISCERAL ANGIOGRAPHY;  Surgeon: Algernon Huxley, MD;  Location: [place] [facility];  Service: Cardiovascular;  Laterality: N/A;  . VISCERAL ARTERY INTERVENTION N/A 08/15/2017   Procedure: VISCERAL ARTERY INTERVENTION;  Surgeon: Algernon Huxley, MD;  Location: [place] [facility];  Service: Cardiovascular;  Laterality: N/A;    Social History Social History        Tobacco Use  . Smoking status: Never Smoker  . Smokeless tobacco: Never Used  Substance Use Topics  . Alcohol use: No    Alcohol/week: 0.0 oz  . Drug use: No    Family History      Family History  Problem Relation Age of Onset  . Heart disease Mother   . Hypertension Father   . Heart disease Brother 77       heart attack  . Cancer Brother        bladder  . Diabetes Sister   . Alcohol abuse Sister   . Kidney disease Sister   . Diabetes Brother   . Stroke Brother   . Diabetes Maternal Grandmother   . Heart disease Maternal Grandmother   . Cancer Brother        lung         Allergies  Allergen Reactions  . Ibandronic Acid Other (See Comments)    Achy all over. Flu like S/S  . Other Other (See Comments)    Achy all over. Flu like S/S Dysphagia  . Actonel [Risedronate] Other (See Comments)    Dysphagia  . Raloxifene Other (See Comments)    Mood swings  . Versed [Midazolam] Other (See Comments)    Difficult waking up and memory loss  . Doxycycline Rash  . Prednisone Rash    REVIEW OF SYSTEMS(Negative unless checked)  Constitutional: [x] Weight loss[] Fever[] Chills Cardiac:[] Chest  pain[] Chest pressure[x] Palpitations [] Shortness of breath when laying flat [] Shortness of breath at rest [] Shortness of breath with exertion. Vascular: [] Pain in legs with walking[] Pain in legsat rest[] Pain in legs when laying flat [] Claudication [] Pain in feet when walking [] Pain in feet at rest [] Pain in feet when laying flat [] History of DVT [] Phlebitis [] Swelling in legs [x] Varicose veins [] Non-healing  ulcers Pulmonary: [] Uses home oxygen [] Productive cough[] Hemoptysis [] Wheeze [] COPD [] Asthma Neurologic: [] Dizziness [] Blackouts [] Seizures [] History of stroke [] History of TIA[] Aphasia [] Temporary blindness[] Dysphagia [] Weaknessor numbness in arms [] Weakness or numbnessin legs Musculoskeletal: [x] Arthritis [] Joint swelling [] Joint pain [x] Low back pain Hematologic:[] Easy bruising[] Easy bleeding [] Hypercoagulable state [] Anemic  Gastrointestinal:[] Blood in stool[] Vomiting blood[x] Gastroesophageal reflux/heartburn[x] Abdominal pain Genitourinary: [x] Chronic kidney disease [] Difficulturination [] Frequenturination [] Burning with urination[] Hematuria Skin: [] Rashes [] Ulcers [] Wounds Psychological: [x] History of anxiety[] History of major depression.      Physical Examination  BP 130/66 (BP Location: Right Arm)   Pulse 69   Resp 16   Wt 47.5 kg (104 lb 12.8 oz)   BMI 20.47 kg/m  Gen:  WD/WN, NAD Head: [place]/AT, No temporalis wasting. Ear/Nose/Throat: Hearing grossly intact, nares w/o erythema or drainage, trachea midline Eyes: Conjunctiva clear. Sclera non-icteric Neck: Supple.  No JVD.  Pulmonary:  Good air movement, no use of accessory muscles.  Cardiac: RRR, normal S1, S2 Vascular:  Vessel Right Left  Radial Palpable Palpable                          PT Palpable Palpable  DP Palpable Palpable   Gastrointestinal: soft, non-tender/non-distended.  Musculoskeletal: M/S  5/5 throughout.  No deformity or atrophy.  Neurologic: Sensation grossly intact in extremities.  Symmetrical.  Speech is fluent.  Psychiatric: Judgment intact, Mood & affect appropriate for pt's clinical situation. Dermatologic: No rashes or ulcers noted.  No cellulitis or open wounds.       Labs No results found for this or any previous visit (from the past 2160 hour(s)).  Radiology No results found.   Assessment/Plan Hyperlipidemia lipid control important in reducing the progression of atherosclerotic disease.   Abdominal pain This is improved after her celiac stent placement now she is complaining of more vague abdominal pain.  This is not necessarily related to eating.  Her stent is widely patent.  I do not know what the cause of her current pain might be and will defer further workup at this time.  I will recheck her stent in 6 months.  Chronic kidney disease, stage III (moderate) Given the need to limit contrast, we willbe cautious with any procedures.   Varicose veins of leg with pain, right Still wearing stockings and elevating her legs.  We will consider intervention in the months going forward if her symptoms do not improve.  She is complaining of less leg pain today.  ABIs were normal.  Celiac artery stenosis (HCC) Status post stent placement.  Seem like her symptoms had improved although she is now complaining intermittent symptoms.  Duplex today shows the stent to be widely patent with no other significant mesenteric arterial stenosis.  I think her abdominal pain is not related to blood flow at this point.  We will recheck her stent in 6 months.      Leotis Pain, MD  01/17/2018 9:46 AM    This note was created with Dragon medical transcription system.  Any errors from dictation are purely unintentional

## 2018-01-27 ENCOUNTER — Telehealth (INDEPENDENT_AMBULATORY_CARE_PROVIDER_SITE_OTHER): Payer: Self-pay

## 2018-01-27 NOTE — Telephone Encounter (Signed)
Patient would like to know if she can cut her Plavix in half every evening and just take a half daily?  She thinks that the Plavix is causing her to be weak?

## 2018-01-27 NOTE — Telephone Encounter (Signed)
Called the patient back to let her know that cutting the Plavix in half is fine with Dr. Lucky Cowboy. She was pleased to know that she could do this.

## 2018-02-07 ENCOUNTER — Ambulatory Visit (INDEPENDENT_AMBULATORY_CARE_PROVIDER_SITE_OTHER): Payer: Medicare Other | Admitting: Vascular Surgery

## 2018-02-07 ENCOUNTER — Encounter (INDEPENDENT_AMBULATORY_CARE_PROVIDER_SITE_OTHER): Payer: Medicare Other

## 2018-03-09 ENCOUNTER — Telehealth: Payer: Self-pay

## 2018-03-09 NOTE — Telephone Encounter (Signed)
Pt is scheduled for an awv with Dr. Sanda Klein on 04/21/18. At Dr. Delight Ovens request, appt was changed for awv to be completed by [facility]. LVM with pt informing her of this change and asked that she please call our office with questions or concerns about this change.

## 2018-03-14 ENCOUNTER — Ambulatory Visit (INDEPENDENT_AMBULATORY_CARE_PROVIDER_SITE_OTHER): Payer: Medicare Other | Admitting: Family Medicine

## 2018-03-14 ENCOUNTER — Encounter: Payer: Self-pay | Admitting: Family Medicine

## 2018-03-14 VITALS — BP 132/78 | HR 84 | Temp 98.3°F | Resp 14 | Ht 60.0 in | Wt 102.5 lb

## 2018-03-14 DIAGNOSIS — R102 Pelvic and perineal pain: Secondary | ICD-10-CM | POA: Diagnosis not present

## 2018-03-14 DIAGNOSIS — N83201 Unspecified ovarian cyst, right side: Secondary | ICD-10-CM

## 2018-03-14 DIAGNOSIS — M05719 Rheumatoid arthritis with rheumatoid factor of unspecified shoulder without organ or systems involvement: Secondary | ICD-10-CM

## 2018-03-14 DIAGNOSIS — E782 Mixed hyperlipidemia: Secondary | ICD-10-CM

## 2018-03-14 DIAGNOSIS — N183 Chronic kidney disease, stage 3 unspecified: Secondary | ICD-10-CM

## 2018-03-14 DIAGNOSIS — M818 Other osteoporosis without current pathological fracture: Secondary | ICD-10-CM | POA: Diagnosis not present

## 2018-03-14 DIAGNOSIS — R531 Weakness: Secondary | ICD-10-CM | POA: Diagnosis not present

## 2018-03-14 DIAGNOSIS — R739 Hyperglycemia, unspecified: Secondary | ICD-10-CM

## 2018-03-14 DIAGNOSIS — I774 Celiac artery compression syndrome: Secondary | ICD-10-CM | POA: Diagnosis not present

## 2018-03-14 DIAGNOSIS — I771 Stricture of artery: Secondary | ICD-10-CM

## 2018-03-14 NOTE — Assessment & Plan Note (Signed)
Check vit D 

## 2018-03-14 NOTE — Progress Notes (Signed)
BP 132/78   Pulse 84   Temp 98.3 F (36.8 C) (Oral)   Resp 14   Ht 5' (1.524 m)   Wt 102 lb 8 oz (46.5 kg)   SpO2 96%   BMI 20.02 kg/m    Subjective:    Patient ID: [name], female    DOB: Jun 23, 1927, 84 y.o.   MRN: [id number]  HPI: [name] is a 84 y.o. female  Chief Complaint  Patient presents with  . Generalized Body Aches    pt states since surgeries/ mostly on right side  . Fatigue    not sleeping well at night    HPI Patient is here for f/u She has had surgery, visceral angiography in October and November of 2018 She had a lumbar laminectomy in July of 2018 She had another [place] in March, no copy on the chart We reviewed the one from January Pain is dull; ever since the  Dull pain under the ribcage, down the left side and through the groin and into the leg, in the front, right down the front and right in the groin; she has told Dr. Lucky Cowboy about this pain; had a really hard time the first week it was implanted; feels like something is pressing on something Her three daughters looked after her Pain in the right 4th and 5th toe, throbbing like it's got a fever, like a muscle spasms in the leg; occasionally having muscle spasms in the night Weight is stable now; lost some weight before surgery Abdominal discomfort does worsen after eating; feels like she is just burning in the midabdomen above the belly button No blood in the stool No fevers; does have weakness Has arthritis in the neck and hands She had one fall since surgery; just stumbled over something and laid on the ground; she can't tell her girls "cause they'll kill me"; she is very careful; just a stumble, no neurologic or cardiac reason she believes; did have one episode of SHOB, lasted maybe five minutes, went away and never recurred; nonsmoker Has already seen GYN for other pelvic pain  Depression screen [facility] 2/9 03/14/2018 04/19/2017 12/24/2016 12/08/2016 11/30/2016  Decreased Interest 0 0 0 0  0  Down, Depressed, Hopeless 0 0 0 0 0  PHQ - 2 Score 0 0 0 0 0    Relevant past medical, surgical, family and social history reviewed Past Medical History:  Diagnosis Date  . Arthritis   . Atherosclerosis   . Atherosclerosis of abdominal aorta ([place])   . Chronic kidney disease    stage 2  . CKD (chronic kidney disease) stage 2, GFR 60-89 ml/min   . Collagen vascular disease ([place])   . Diverticulosis 2008  . GERD (gastroesophageal reflux disease)   . Heart murmur   . Hyperlipidemia   . Lumbar spinal stenosis   . Osteoporosis   . Ovarian lump   . RA (rheumatoid arthritis) ([place])   . Raynaud's disease   . Rheumatoid arthritis ([place])   . Spinal stenosis    Past Surgical History:  Procedure Laterality Date  . BREAST SURGERY Left 1965   Benign biopsy  . CHOLECYSTECTOMY  2005   [place]  . COLONOSCOPY  2008   Dr. Donnella Sham  . EXCISION OF BREAST BIOPSY Left 1965   benign  . EYE SURGERY Bilateral    Cataract Extraction with IOL  . KYPHOPLASTY N/A 12/17/2016   Procedure: KYPHOPLASTY;  Surgeon: Hessie Knows, MD;  Location: [facility] ORS;  Service: Orthopedics;  Laterality: N/A;  l4  . LUMBAR LAMINECTOMY  05/09/2017   L2-L5  . VISCERAL ANGIOGRAPHY N/A 08/15/2017   Procedure: VISCERAL ANGIOGRAPHY;  Surgeon: Algernon Huxley, MD;  Location: [place] CV LAB;  Service: Cardiovascular;  Laterality: N/A;  . VISCERAL ANGIOGRAPHY N/A 10/05/2017   Procedure: VISCERAL ANGIOGRAPHY;  Surgeon: Algernon Huxley, MD;  Location: [place] CV LAB;  Service: Cardiovascular;  Laterality: N/A;  . VISCERAL ARTERY INTERVENTION N/A 08/15/2017   Procedure: VISCERAL ARTERY INTERVENTION;  Surgeon: Algernon Huxley, MD;  Location: [place] CV LAB;  Service: Cardiovascular;  Laterality: N/A;   Family History  Problem Relation Age of Onset  . Heart disease Mother   . Hypertension Father   . Heart disease Brother 66       heart attack  . Cancer Brother        bladder  . Diabetes Sister   . Alcohol abuse  Sister   . Kidney disease Sister   . Diabetes Brother   . Stroke Brother   . Diabetes Maternal Grandmother   . Heart disease Maternal Grandmother   . Cancer Brother        lung   Social History   Tobacco Use  . Smoking status: Never Smoker  . Smokeless tobacco: Never Used  Substance Use Topics  . Alcohol use: No    Alcohol/week: 0.0 oz  . Drug use: No    Interim medical history since last visit reviewed. Allergies and medications reviewed  Review of Systems Per HPI unless specifically indicated above     Objective:    BP 132/78   Pulse 84   Temp 98.3 F (36.8 C) (Oral)   Resp 14   Ht 5' (1.524 m)   Wt 102 lb 8 oz (46.5 kg)   SpO2 96%   BMI 20.02 kg/m   Wt Readings from Last 3 Encounters:  03/14/18 102 lb 8 oz (46.5 kg)  01/17/18 104 lb 12.8 oz (47.5 kg)  12/23/17 103 lb (46.7 kg)    Physical Exam  Constitutional: She appears well-developed and well-nourished. No distress.  HENT:  Head: Normocephalic and atraumatic.  Eyes: EOM are normal. No scleral icterus.  Neck: No thyromegaly present.  Cardiovascular: Normal rate, regular rhythm and normal heart sounds.  No murmur heard. Pulmonary/Chest: Effort normal and breath sounds normal. No respiratory distress. She has no wheezes.  Abdominal: Soft. Bowel sounds are normal. She exhibits no distension. There is no hepatosplenomegaly. There is tenderness (mild tenderness above the umbilicus, as well as RLQ and right side pelvis; no masses; no guarding, no true rebound). There is no rigidity, no rebound and no guarding.  Musculoskeletal: Normal range of motion. She exhibits no edema.       Right hand: She exhibits deformity.       Left hand: She exhibits deformity.       Right foot: There is no deformity.  Enlargement of the MCPs of both hands, base of thumbs  Feet:  Right Foot:  Skin Integrity: Negative for ulcer, blister, skin breakdown, erythema, warmth or callus.  Neurological: She is alert. She exhibits normal  muscle tone.  Skin: Skin is warm and dry. She is not diaphoretic. No pallor.  Large ropey varicosities in the legs, right side examined predominantly; no ulceration or skin breakdown  Psychiatric: She has a normal mood and affect. Her behavior is normal. Judgment and thought content normal.    Results for orders placed or performed during the hospital encounter of  10/05/17  BUN  Result Value Ref Range   BUN 13 6 - 20 mg/dL  Creatinine, serum  Result Value Ref Range   Creatinine, Ser 0.54 0.44 - 1.00 mg/dL   GFR calc non Af Amer >60 >60 mL/min   GFR calc Af Amer >60 >60 mL/min      Assessment & Plan:   Problem List Items Addressed This Visit      Cardiovascular and Mediastinum   Celiac artery stenosis (HCC)    With residual pain after stenting; not sure if vascular issues are responsible or other cause; sending note to Dr. Lucky Cowboy and will get CT abdomen, pelvis      Relevant Orders   MR Abdomen W Wo Contrast     Musculoskeletal and Integument   Rheumatoid arthritis ([place])   Relevant Orders   CBC with Differential/Platelet   ANA,IFA RA Diag Pnl w/rflx Tit/Patn   VITAMIN D 25 Hydroxy (Vit-D Deficiency, Fractures)   Osteoporosis    Check vit D      Relevant Orders   VITAMIN D 25 Hydroxy (Vit-D Deficiency, Fractures)     Genitourinary   Ovarian cyst   Relevant Orders   MR Pelvis W Wo Contrast   Chronic kidney disease, stage III (moderate) (HCC)    Monitor kidneys      Relevant Orders   COMPLETE METABOLIC PANEL WITH GFR     Other   Pelvic pain in female - Primary   Relevant Orders   MR Pelvis W Wo Contrast   Weakness    Will check labs      Relevant Orders   VITAMIN D 25 Hydroxy (Vit-D Deficiency, Fractures)   Hyperlipidemia    check      Relevant Orders   Lipid panel   Hyperglycemia    Check labs      Relevant Orders   Hemoglobin A1c       Follow up plan: Return in about 3 weeks (around 04/04/2018).  An after-visit summary was printed and given  to the patient at [place].  Please see the patient instructions which may contain other information and recommendations beyond what is mentioned above in the assessment and plan.  No orders of the defined types were placed in this encounter.   Orders Placed This Encounter  Procedures  . MR Abdomen W Wo Contrast  . MR Pelvis W Wo Contrast  . CBC with Differential/Platelet  . COMPLETE METABOLIC PANEL WITH GFR  . Hemoglobin A1c  . Lipid panel  . ANA,IFA RA Diag Pnl w/rflx Tit/Patn  . VITAMIN D 25 Hydroxy (Vit-D Deficiency, Fractures)

## 2018-03-14 NOTE — Assessment & Plan Note (Signed)
check

## 2018-03-14 NOTE — Assessment & Plan Note (Signed)
Will check labs

## 2018-03-14 NOTE — Assessment & Plan Note (Signed)
With residual pain after stenting; not sure if vascular issues are responsible or other cause; sending note to Dr. Lucky Cowboy and will get CT abdomen, pelvis

## 2018-03-14 NOTE — Patient Instructions (Addendum)
Please request the ultrasound report and any notes from Dr. Lucky Cowboy at the vascular office  Please call [phone] to schedule your imaging test Please wait 2-3 days after the order has been placed to call and get your test scheduled  We'll get the labs If you have not heard anything from my staff in a week about any orders/referrals/studies from today, please contact us here to follow-up [phone]

## 2018-03-14 NOTE — Assessment & Plan Note (Signed)
Check labs 

## 2018-03-14 NOTE — Assessment & Plan Note (Signed)
Monitor kidneys

## 2018-03-16 LAB — COMPLETE METABOLIC PANEL WITH GFR
AG Ratio: 1.6 (calc) (ref 1.0–2.5)
ALT: 21 U/L (ref 6–29)
AST: 34 U/L (ref 10–35)
Albumin: 4.2 g/dL (ref 3.6–5.1)
Alkaline phosphatase (APISO): 64 U/L (ref 33–130)
BUN: 15 mg/dL (ref 7–25)
CO2: 33 mmol/L — ABNORMAL HIGH (ref 20–32)
Calcium: 9.5 mg/dL (ref 8.6–10.4)
Chloride: 102 mmol/L (ref 98–110)
Creat: 0.62 mg/dL (ref 0.60–0.88)
GFR, Est African American: 93 mL/min/{1.73_m2} (ref >=60)
GFR, Est Non African American: 81 mL/min/{1.73_m2} (ref >=60)
Globulin: 2.7 g/dL (calc) (ref 1.9–3.7)
Glucose, Bld: 84 mg/dL (ref 65–139)
Potassium: 4.7 mmol/L (ref 3.5–5.3)
Sodium: 138 mmol/L (ref 135–146)
Total Bilirubin: 0.5 mg/dL (ref 0.2–1.2)
Total Protein: 6.9 g/dL (ref 6.1–8.1)

## 2018-03-16 LAB — ANA,IFA RA DIAG PNL W/RFLX TIT/PATN
Anti Nuclear Antibody(ANA): POSITIVE — AB
Cyclic Citrullin Peptide Ab: <16 UNITS
Rhuematoid fact SerPl-aCnc: 34 IU/mL — ABNORMAL HIGH (ref <14)

## 2018-03-16 LAB — LIPID PANEL
Cholesterol: 221 mg/dL — ABNORMAL HIGH (ref <200)
HDL: 66 mg/dL (ref >50)
LDL Cholesterol (Calc): 133 mg/dL (calc) — ABNORMAL HIGH
Non-HDL Cholesterol (Calc): 155 mg/dL (calc) — ABNORMAL HIGH (ref <130)
Total CHOL/HDL Ratio: 3.3 (calc) (ref <5.0)
Triglycerides: 116 mg/dL (ref <150)

## 2018-03-16 LAB — ANTI-NUCLEAR AB-TITER (ANA TITER): ANA Titer 1: 1:80 {titer} — ABNORMAL HIGH

## 2018-03-16 LAB — HEMOGLOBIN A1C
Hgb A1c MFr Bld: 6.1 % of total Hgb — ABNORMAL HIGH (ref <5.7)
Mean Plasma Glucose: 128 (calc)
eAG (mmol/L): 7.1 (calc)

## 2018-03-16 LAB — VITAMIN D 25 HYDROXY (VIT D DEFICIENCY, FRACTURES): Vit D, 25-Hydroxy: 41 ng/mL (ref 30–100)

## 2018-03-16 LAB — CBC WITH DIFFERENTIAL/PLATELET
Basophils Absolute: 40 cells/uL (ref 0–200)
Basophils Relative: 0.9 %
Eosinophils Absolute: 31 cells/uL (ref 15–500)
Eosinophils Relative: 0.7 %
HCT: 42.4 % (ref 35.0–45.0)
Hemoglobin: 14.6 g/dL (ref 11.7–15.5)
Lymphs Abs: 928 cells/uL (ref 850–3900)
MCH: 29.7 pg (ref 27.0–33.0)
MCHC: 34.4 g/dL (ref 32.0–36.0)
MCV: 86.2 fL (ref 80.0–100.0)
MPV: 11.1 fL (ref 7.5–12.5)
Monocytes Relative: 11.1 %
Neutro Abs: 2913 cells/uL (ref 1500–7800)
Neutrophils Relative %: 66.2 %
Platelets: 223 10*3/uL (ref 140–400)
RBC: 4.92 10*6/uL (ref 3.80–5.10)
RDW: 13.1 % (ref 11.0–15.0)
Total Lymphocyte: 21.1 %
WBC mixed population: 488 cells/uL (ref 200–950)
WBC: 4.4 10*3/uL (ref 3.8–10.8)

## 2018-03-25 ENCOUNTER — Ambulatory Visit
Admission: RE | Admit: 2018-03-25 | Discharge: 2018-03-25 | Disposition: A | Discharge status: Home / Self Care | Payer: Medicare Other | Source: Ambulatory Visit | Attending: Family Medicine | Admitting: Family Medicine

## 2018-03-25 DIAGNOSIS — R102 Pelvic and perineal pain: Secondary | ICD-10-CM | POA: Diagnosis not present

## 2018-03-25 DIAGNOSIS — I774 Celiac artery compression syndrome: Secondary | ICD-10-CM | POA: Diagnosis not present

## 2018-03-25 DIAGNOSIS — K862 Cyst of pancreas: Secondary | ICD-10-CM | POA: Insufficient documentation

## 2018-03-25 DIAGNOSIS — N83201 Unspecified ovarian cyst, right side: Secondary | ICD-10-CM | POA: Diagnosis not present

## 2018-03-25 DIAGNOSIS — I771 Stricture of artery: Secondary | ICD-10-CM

## 2018-03-25 DIAGNOSIS — I700 Atherosclerosis of aorta: Secondary | ICD-10-CM | POA: Insufficient documentation

## 2018-03-25 DIAGNOSIS — K5730 Diverticulosis of large intestine without perforation or abscess without bleeding: Secondary | ICD-10-CM | POA: Insufficient documentation

## 2018-03-25 MED ORDER — GADOBENATE DIMEGLUMINE 529 MG/ML IV SOLN
9.0000 mL | Freq: Once | INTRAVENOUS | Status: AC | PRN
  Administered 2018-03-25: 9 mL via INTRAVENOUS

## 2018-04-04 DIAGNOSIS — R768 Other specified abnormal immunological findings in serum: Secondary | ICD-10-CM | POA: Diagnosis not present

## 2018-04-04 DIAGNOSIS — M7989 Other specified soft tissue disorders: Secondary | ICD-10-CM | POA: Diagnosis not present

## 2018-04-04 DIAGNOSIS — I7300 Raynaud's syndrome without gangrene: Secondary | ICD-10-CM | POA: Diagnosis not present

## 2018-04-04 DIAGNOSIS — R911 Solitary pulmonary nodule: Secondary | ICD-10-CM | POA: Diagnosis not present

## 2018-04-04 DIAGNOSIS — R682 Dry mouth, unspecified: Secondary | ICD-10-CM | POA: Diagnosis not present

## 2018-04-04 DIAGNOSIS — M79675 Pain in left toe(s): Secondary | ICD-10-CM | POA: Diagnosis not present

## 2018-04-10 ENCOUNTER — Encounter: Payer: Self-pay | Admitting: Family Medicine

## 2018-04-10 ENCOUNTER — Ambulatory Visit (INDEPENDENT_AMBULATORY_CARE_PROVIDER_SITE_OTHER): Payer: Medicare Other | Admitting: Family Medicine

## 2018-04-10 DIAGNOSIS — M069 Rheumatoid arthritis, unspecified: Secondary | ICD-10-CM

## 2018-04-10 DIAGNOSIS — I83811 Varicose veins of right lower extremities with pain: Secondary | ICD-10-CM | POA: Diagnosis not present

## 2018-04-10 DIAGNOSIS — M818 Other osteoporosis without current pathological fracture: Secondary | ICD-10-CM

## 2018-04-10 DIAGNOSIS — K862 Cyst of pancreas: Secondary | ICD-10-CM | POA: Diagnosis not present

## 2018-04-10 DIAGNOSIS — T466X5A Adverse effect of antihyperlipidemic and antiarteriosclerotic drugs, initial encounter: Secondary | ICD-10-CM

## 2018-04-10 DIAGNOSIS — M791 Myalgia, unspecified site: Secondary | ICD-10-CM | POA: Diagnosis not present

## 2018-04-10 DIAGNOSIS — I700 Atherosclerosis of aorta: Secondary | ICD-10-CM

## 2018-04-10 DIAGNOSIS — S32020S Wedge compression fracture of second lumbar vertebra, sequela: Secondary | ICD-10-CM | POA: Diagnosis not present

## 2018-04-10 DIAGNOSIS — E782 Mixed hyperlipidemia: Secondary | ICD-10-CM

## 2018-04-10 MED ORDER — CLOPIDOGREL BISULFATE 75 MG PO TABS: 37.5000 mg | ORAL_TABLET | Freq: Every day | ORAL | Status: DC

## 2018-04-10 NOTE — Assessment & Plan Note (Signed)
Managed by vascular doctor 

## 2018-04-10 NOTE — Assessment & Plan Note (Signed)
Unable to tolerate statin

## 2018-04-10 NOTE — Assessment & Plan Note (Signed)
Taking tylenol for pain; osteoporosis managed by rheum

## 2018-04-10 NOTE — Progress Notes (Signed)
BP 116/80   Pulse 82   Temp 97.8 F (36.6 C) (Oral)   Resp 12   Ht 5' (1.524 m)   Wt 102 lb 1.6 oz (46.3 kg)   SpO2 96%   BMI 19.94 kg/m    Subjective:    Patient ID: [name], female    DOB: 1926-12-31, 84 y.o.   MRN: [id number]  HPI: [name] is a 84 y.o. female  Chief Complaint  Patient presents with  . Follow-up    HPI Patient is here for f/u She is feeling "better"; she cannot stand up like she should Doing everything she can to make it better She had a situation in her foot; both feet, toes are feeling tight; tingling; going on for a few weeks; tingling in the right foot but in the left foot too; got in the pool at the motel and tried to swim and it got better; used to be an avid swimmer; no tingling on the fingers; can feel pulse in her hands, strong; not throbbing; she is taking vitamin B12; it was 500 mcg daily, taking that forever, swallows whole, and new dosage is chewable She saw the rheumatologist and had labs done and said everything was normal; normal uric acid She saw the vascular doctor about the atherosclerosis; just having the discomfort when she lays down; just sitting down, just like simmering water is the feeling; from navel to the side to the groin, like simmering water, like a little ripple; no blood in the urine or stool  Since last visit, she underwent imaging; she had an MRI of the abdomen and pelvis on 03/27/18  IMPRESSION: 1. No MRI findings to explain the patient's history of abdominal and pelvic pain. 2. Left colonic diverticulosis. 3.  Aortic Atherosclerois (ICD10-170.0) 4. Tiny cystic foci in the pancreas, most likely benign. For cystic lesions of this size in a patient of this age, consensus recommendation is for repeat MRI in 2 years. This recommendation follows ACR consensus guidelines: Management of Incidental Pancreatic Cysts: A White Paper of the ACR Incidental Findings Committee. [place]  [id number].   Electronically Signed   By: Misty Stanley M.D.   On: 03/27/2018 08:40  She is not taking the statin or aspirin; she thought the statin caused aching; aspirin causes easy bruising; taking half of a plavix daily, prescribed by vascular doctor; also has varicose veins; he wanted to "blast" them, but she is holding off right now  Compression fracture and osteoporosis; seeing Dr. Earlie Counts; she does not think she has RA, will go back to see her; she does have OA; getting greens; good fall precautions  Depression screen [facility] 2/9 04/10/2018 03/14/2018 04/19/2017 12/24/2016 12/08/2016  Decreased Interest 0 0 0 0 0  Down, Depressed, Hopeless 0 0 0 0 0  PHQ - 2 Score 0 0 0 0 0    Relevant past medical, surgical, family and social history reviewed Past Medical History:  Diagnosis Date  . Arthritis   . Atherosclerosis   . Atherosclerosis of abdominal aorta ([place])   . Chronic kidney disease    stage 2  . CKD (chronic kidney disease) stage 2, GFR 60-89 ml/min   . Collagen vascular disease ([place])   . Diverticulosis 2008  . GERD (gastroesophageal reflux disease)   . Heart murmur   . Hyperlipidemia   . Lumbar spinal stenosis   . Osteoporosis   . Ovarian lump   . RA (rheumatoid arthritis) ([place])   .  Raynaud's disease   . Rheumatoid arthritis ([place])   . Spinal stenosis    Past Surgical History:  Procedure Laterality Date  . BREAST SURGERY Left 1965   Benign biopsy  . CHOLECYSTECTOMY  2005   [place]  . COLONOSCOPY  2008   Dr. Donnella Sham  . EXCISION OF BREAST BIOPSY Left 1965   benign  . EYE SURGERY Bilateral    Cataract Extraction with IOL  . KYPHOPLASTY N/A 12/17/2016   Procedure: KYPHOPLASTY;  Surgeon: Hessie Knows, MD;  Location: [facility] ORS;  Service: Orthopedics;  Laterality: N/A;  l4  . LUMBAR LAMINECTOMY  05/09/2017   L2-L5  . VISCERAL ANGIOGRAPHY N/A 08/15/2017   Procedure: VISCERAL ANGIOGRAPHY;  Surgeon: Algernon Huxley, MD;  Location: [place] CV LAB;  Service:  Cardiovascular;  Laterality: N/A;  . VISCERAL ANGIOGRAPHY N/A 10/05/2017   Procedure: VISCERAL ANGIOGRAPHY;  Surgeon: Algernon Huxley, MD;  Location: [place] CV LAB;  Service: Cardiovascular;  Laterality: N/A;  . VISCERAL ARTERY INTERVENTION N/A 08/15/2017   Procedure: VISCERAL ARTERY INTERVENTION;  Surgeon: Algernon Huxley, MD;  Location: [place] CV LAB;  Service: Cardiovascular;  Laterality: N/A;   Family History  Problem Relation Age of Onset  . Heart disease Mother   . Hypertension Father   . Heart disease Brother 52       heart attack  . Cancer Brother        bladder  . Diabetes Sister   . Alcohol abuse Sister   . Kidney disease Sister   . Diabetes Brother   . Stroke Brother   . Diabetes Maternal Grandmother   . Heart disease Maternal Grandmother   . Cancer Brother        lung   Social History   Tobacco Use  . Smoking status: Never Smoker  . Smokeless tobacco: Never Used  Substance Use Topics  . Alcohol use: No    Alcohol/week: 0.0 oz  . Drug use: No    Interim medical history since last visit reviewed. Allergies and medications reviewed  Review of Systems Per HPI unless specifically indicated above     Objective:    BP 116/80   Pulse 82   Temp 97.8 F (36.6 C) (Oral)   Resp 12   Ht 5' (1.524 m)   Wt 102 lb 1.6 oz (46.3 kg)   SpO2 96%   BMI 19.94 kg/m   Wt Readings from Last 3 Encounters:  04/10/18 102 lb 1.6 oz (46.3 kg)  03/14/18 102 lb 8 oz (46.5 kg)  01/17/18 104 lb 12.8 oz (47.5 kg)    Physical Exam  Constitutional: She appears well-developed and well-nourished.  HENT:  Mouth/Throat: Mucous membranes are normal.  Eyes: EOM are normal. No scleral icterus.  Cardiovascular: Normal rate and regular rhythm.  Pulses:      Radial pulses are 2+ on the right side, and 2+ on the left side.  Pulmonary/Chest: Effort normal and breath sounds normal.  Musculoskeletal:  Enlarged MCPS and PIPS both hands  Skin:  Varicose veins in the legs;  bruising noted left arm  Psychiatric: She has a normal mood and affect. Her behavior is normal.    Results for orders placed or performed in visit on 03/14/18  CBC with Differential/Platelet  Result Value Ref Range   WBC 4.4 3.8 - 10.8 Thousand/uL   RBC 4.92 3.80 - 5.10 Million/uL   Hemoglobin 14.6 11.7 - 15.5 g/dL   HCT 42.4 35.0 - 45.0 %   MCV 86.2  80.0 - 100.0 fL   MCH 29.7 27.0 - 33.0 pg   MCHC 34.4 32.0 - 36.0 g/dL   RDW 13.1 11.0 - 15.0 %   Platelets 223 140 - 400 Thousand/uL   MPV 11.1 7.5 - 12.5 fL   Neutro Abs 2,913 1,500 - 7,800 cells/uL   Lymphs Abs 928 850 - 3,900 cells/uL   WBC mixed population 488 200 - 950 cells/uL   Eosinophils Absolute 31 15 - 500 cells/uL   Basophils Absolute 40 0 - 200 cells/uL   Neutrophils Relative % 66.2 %   Total Lymphocyte 21.1 %   Monocytes Relative 11.1 %   Eosinophils Relative 0.7 %   Basophils Relative 0.9 %  COMPLETE METABOLIC PANEL WITH GFR  Result Value Ref Range   Glucose, Bld 84 65 - 139 mg/dL   BUN 15 7 - 25 mg/dL   Creat 0.62 0.60 - 0.88 mg/dL   GFR, Est Non African American 81 > OR = 60 mL/min/1.30m2   GFR, Est African American 93 > OR = 60 mL/min/1.14m2   BUN/Creatinine Ratio NOT APPLICABLE 6 - 22 (calc)   Sodium 138 135 - 146 mmol/L   Potassium 4.7 3.5 - 5.3 mmol/L   Chloride 102 98 - 110 mmol/L   CO2 33 (H) 20 - 32 mmol/L   Calcium 9.5 8.6 - 10.4 mg/dL   Total Protein 6.9 6.1 - 8.1 g/dL   Albumin 4.2 3.6 - 5.1 g/dL   Globulin 2.7 1.9 - 3.7 g/dL (calc)   AG Ratio 1.6 1.0 - 2.5 (calc)   Total Bilirubin 0.5 0.2 - 1.2 mg/dL   Alkaline phosphatase (APISO) 64 33 - 130 U/L   AST 34 10 - 35 U/L   ALT 21 6 - 29 U/L  Hemoglobin A1c  Result Value Ref Range   Hgb A1c MFr Bld 6.1 (H) <5.7 % of total Hgb   Mean Plasma Glucose 128 (calc)   eAG (mmol/L) 7.1 (calc)  Lipid panel  Result Value Ref Range   Cholesterol 221 (H) <200 mg/dL   HDL 66 >50 mg/dL   Triglycerides 116 <150 mg/dL   LDL Cholesterol (Calc) 133 (H) mg/dL  (calc)   Total CHOL/HDL Ratio 3.3 <5.0 (calc)   Non-HDL Cholesterol (Calc) 155 (H) <130 mg/dL (calc)  ANA,IFA RA Diag Pnl w/rflx Tit/Patn  Result Value Ref Range   Anit Nuclear Antibody(ANA) POSITIVE (A) NEGATIVE   Rhuematoid fact SerPl-aCnc 34 (H) <00 IU/mL   Cyclic Citrullin Peptide Ab <16 UNITS   INTERPRETATION    VITAMIN D 25 Hydroxy (Vit-D Deficiency, Fractures)  Result Value Ref Range   Vit D, 25-Hydroxy 41 30 - 100 ng/mL  Anti-nuclear ab-titer (ANA titer)  Result Value Ref Range   ANA Pattern 1 HOMOGENEOUS (A)    ANA Titer 1 1:80 (H) titer      Assessment & Plan:   Problem List Items Addressed This Visit      Cardiovascular and Mediastinum   Varicose veins of leg with pain, right    Managed by vascular doctor      Atherosclerosis of abdominal aorta ([place])    Reviewed findings on the scan; showed her a model of aortic atherosclerosis at various stages; she has opted to stop the statin        Digestive   Pancreatic cyst    Due for repeat MRI pancreas around Mar 27, 2020        Musculoskeletal and Integument   Rheumatoid arthritis ([place])  Sounds as if rheumatologist will be seeing her back soon, diagnosis of RA is doubtful per patient; she certainly has MCP and PIP enlargement      Osteoporosis    Managed by rheumatologist; discussed dietary intake ([facility]) and fall precautions      Compression fracture of L2 lumbar vertebra ([place])    Taking tylenol for pain; osteoporosis managed by rheum        Other   Myalgia due to statin    Unable to tolerate statin      Hyperlipidemia    Patient has opted to stop the statin due to myalgias          Follow up plan: Return in about 6 months (around 10/10/2018).  An after-visit summary was printed and given to the patient at [place].  Please see the patient instructions which may contain other information and recommendations beyond what is mentioned above in the assessment and plan.  Meds ordered this  encounter  Medications  . clopidogrel (PLAVIX) 75 MG tablet    Sig: Take 0.5 tablets (37.5 mg total) by mouth daily.    No orders of the defined types were placed in this encounter.

## 2018-04-10 NOTE — Assessment & Plan Note (Signed)
Patient has opted to stop the statin due to myalgias

## 2018-04-10 NOTE — Assessment & Plan Note (Signed)
Reviewed findings on the scan; showed her a model of aortic atherosclerosis at various stages; she has opted to stop the statin

## 2018-04-10 NOTE — Assessment & Plan Note (Signed)
Sounds as if rheumatologist will be seeing her back soon, diagnosis of RA is doubtful per patient; she certainly has MCP and PIP enlargement

## 2018-04-10 NOTE — Patient Instructions (Signed)

## 2018-04-10 NOTE — Assessment & Plan Note (Signed)
Managed by rheumatologist; discussed dietary intake ([facility]) and fall precautions

## 2018-04-10 NOTE — Assessment & Plan Note (Signed)
Due for repeat MRI pancreas around Mar 27, 2020

## 2018-04-28 ENCOUNTER — Ambulatory Visit (INDEPENDENT_AMBULATORY_CARE_PROVIDER_SITE_OTHER): Payer: Medicare Other

## 2018-04-28 VITALS — BP 122/70 | HR 70 | Temp 97.8°F | Resp 12 | Ht 60.0 in | Wt 102.2 lb

## 2018-04-28 DIAGNOSIS — Z0000 Encounter for general adult medical examination without abnormal findings: Secondary | ICD-10-CM | POA: Diagnosis not present

## 2018-04-28 NOTE — Patient Instructions (Addendum)
[name] , Thank you for taking time to come for your Medicare Wellness Visit. I appreciate your ongoing commitment to your health goals. Please review the following plan we discussed and let me know if I can assist you in the future.   Screening recommendations/referrals: Colorectal Screening: No longer required Mammogram: No longer required Bone Density: No longer required Lung Cancer Screening: You do not qualify for this screening Hepatitis C Screening: You do not qualify for this screening  Vision and Dental Exams: Recommended annual ophthalmology exams for early detection of glaucoma and other disorders of the eye Recommended annual dental exams for proper oral hygiene  Vaccinations: Influenza vaccine: Up to date Pneumococcal vaccine: Up to date Tdap vaccine: Up to date Shingles vaccine: Please call your insurance company to determine your out of pocket expense for the Shingrix vaccine. You may also receive this vaccine at your local pharmacy or Health Dept.   Advanced directives: We have received a copy of your POA (Power of [place]) and/or Living Will. These documents can be located in your chart.  Goals: Recommend to drink at least 6-8 8oz glasses of water per day.  Next appointment: Please schedule your Annual Wellness Visit with your Nurse Health Advisor in one year.  Preventive Care 75 Years and Older, Female Preventive care refers to lifestyle choices and visits with your health care provider that can promote health and wellness. What does preventive care include?  A yearly physical exam. This is also called an annual well check.  Dental exams once or twice a year.  Routine eye exams. Ask your health care provider how often you should have your eyes checked.  Personal lifestyle choices, including:  Daily care of your teeth and gums.  Regular physical activity.  Eating a healthy diet.  Avoiding tobacco and drug use.  Limiting alcohol use.  Practicing  safe sex.  Taking low-dose aspirin every day.  Taking vitamin and mineral supplements as recommended by your health care provider. What happens during an annual well check? The services and screenings done by your health care provider during your annual well check will depend on your age, overall health, lifestyle risk factors, and family history of disease. Counseling  Your health care provider may ask you questions about your:  Alcohol use.  Tobacco use.  Drug use.  Emotional well-being.  Home and relationship well-being.  Sexual activity.  Eating habits.  History of falls.  Memory and ability to understand (cognition).  Work and work Statistician.  Reproductive health. Screening  You may have the following tests or measurements:  Height, weight, and BMI.  Blood pressure.  Lipid and cholesterol levels. These may be checked every 5 years, or more frequently if you are over 23 years old.  Skin check.  Lung cancer screening. You may have this screening every year starting at age 63 if you have a 30-pack-year history of smoking and currently smoke or have quit within the past 15 years.  Fecal occult blood test (FOBT) of the stool. You may have this test every year starting at age 90.  Flexible sigmoidoscopy or colonoscopy. You may have a sigmoidoscopy every 5 years or a colonoscopy every 10 years starting at age 61.  Hepatitis C blood test.  Hepatitis B blood test.  Sexually transmitted disease (STD) testing.  Diabetes screening. This is done by checking your blood sugar (glucose) after you have not eaten for a while (fasting). You may have this done every 1-3 years.  Bone density scan.  This is done to screen for osteoporosis. You may have this done starting at age 71.  Mammogram. This may be done every 1-2 years. Talk to your health care provider about how often you should have regular mammograms. Talk with your health care provider about your test results,  treatment options, and if necessary, the need for more tests. Vaccines  Your health care provider may recommend certain vaccines, such as:  Influenza vaccine. This is recommended every year.  Tetanus, diphtheria, and acellular pertussis (Tdap, Td) vaccine. You may need a Td booster every 10 years.  Zoster vaccine. You may need this after age 11.  Pneumococcal 13-valent conjugate (PCV13) vaccine. One dose is recommended after age 28.  Pneumococcal polysaccharide (PPSV23) vaccine. One dose is recommended after age 72. Talk to your health care provider about which screenings and vaccines you need and how often you need them. This information is not intended to replace advice given to you by your health care provider. Make sure you discuss any questions you have with your health care provider. Document Released: 11/21/2015 Document Revised: 07/14/2016 Document Reviewed: 08/26/2015 Elsevier Interactive Patient Education  2017 [place] Prevention in the Home Falls can cause injuries. They can happen to people of all ages. There are many things you can do to make your home safe and to help prevent falls. What can I do on the outside of my home?  Regularly fix the edges of walkways and driveways and fix any cracks.  Remove anything that might make you trip as you walk through a door, such as a raised step or threshold.  Trim any bushes or trees on the path to your home.  Use bright outdoor lighting.  Clear any walking paths of anything that might make someone trip, such as rocks or tools.  Regularly check to see if handrails are loose or broken. Make sure that both sides of any steps have handrails.  Any raised decks and porches should have guardrails on the edges.  Have any leaves, snow, or ice cleared regularly.  Use sand or salt on walking paths during winter.  Clean up any spills in your garage right away. This includes oil or grease spills. What can I do in the  bathroom?  Use night lights.  Install grab bars by the toilet and in the tub and shower. Do not use towel bars as grab bars.  Use non-skid mats or decals in the tub or shower.  If you need to sit down in the shower, use a plastic, non-slip stool.  Keep the floor dry. Clean up any water that spills on the floor as soon as it happens.  Remove soap buildup in the tub or shower regularly.  Attach bath mats securely with double-sided non-slip rug tape.  Do not have throw rugs and other things on the floor that can make you trip. What can I do in the bedroom?  Use night lights.  Make sure that you have a light by your bed that is easy to reach.  Do not use any sheets or blankets that are too big for your bed. They should not hang down onto the floor.  Have a firm chair that has side arms. You can use this for support while you get dressed.  Do not have throw rugs and other things on the floor that can make you trip. What can I do in the kitchen?  Clean up any spills right away.  Avoid walking on wet floors.  Keep items that you use a lot in easy-to-reach places.  If you need to reach something above you, use a strong step stool that has a grab bar.  Keep electrical cords out of the way.  Do not use floor polish or wax that makes floors slippery. If you must use wax, use non-skid floor wax.  Do not have throw rugs and other things on the floor that can make you trip. What can I do with my stairs?  Do not leave any items on the stairs.  Make sure that there are handrails on both sides of the stairs and use them. Fix handrails that are broken or loose. Make sure that handrails are as long as the stairways.  Check any carpeting to make sure that it is firmly attached to the stairs. Fix any carpet that is loose or worn.  Avoid having throw rugs at the top or bottom of the stairs. If you do have throw rugs, attach them to the floor with carpet tape.  Make sure that you have a  light switch at the top of the stairs and the bottom of the stairs. If you do not have them, ask someone to add them for you. What else can I do to help prevent falls?  Wear shoes that:  Do not have high heels.  Have rubber bottoms.  Are comfortable and fit you well.  Are closed at the toe. Do not wear sandals.  If you use a stepladder:  Make sure that it is fully opened. Do not climb a closed stepladder.  Make sure that both sides of the stepladder are locked into place.  Ask someone to hold it for you, if possible.  Clearly mark and make sure that you can see:  Any grab bars or handrails.  First and last steps.  Where the edge of each step is.  Use tools that help you move around (mobility aids) if they are needed. These include:  Canes.  Walkers.  Scooters.  Crutches.  Turn on the lights when you go into a dark area. Replace any light bulbs as soon as they burn out.  Set up your furniture so you have a clear path. Avoid moving your furniture around.  If any of your floors are uneven, fix them.  If there are any pets around you, be aware of where they are.  Review your medicines with your doctor. Some medicines can make you feel dizzy. This can increase your chance of falling. Ask your doctor what other things that you can do to help prevent falls. This information is not intended to replace advice given to you by your health care provider. Make sure you discuss any questions you have with your health care provider. Document Released: 08/21/2009 Document Revised: 04/01/2016 Document Reviewed: 11/29/2014 Elsevier Interactive Patient Education  2017 [employer].

## 2018-04-28 NOTE — Progress Notes (Signed)
Subjective:   [name] is a 84 y.o. female who presents for Medicare Annual (Subsequent) preventive examination.  Review of Systems:  N/A Cardiac Risk Factors include: advanced age (>38men, >79 women);dyslipidemia;sedentary lifestyle     Objective:     Vitals: BP 122/70 (BP Location: Left Arm, Patient Position: Sitting, Cuff Size: Normal)   Pulse 70   Temp 97.8 F (36.6 C) (Oral)   Resp 12   Ht 5' (1.524 m)   Wt 102 lb 3.2 oz (46.4 kg)   SpO2 95%   BMI 19.96 kg/m   Body mass index is 19.96 kg/m.  Advanced Directives 04/28/2018 10/05/2017 08/30/2017 08/02/2017 07/19/2017 04/19/2017 12/24/2016  Does Patient Have a Medical Advance Directive? Yes Yes Yes Yes No Yes Yes  Type of Paramedic of [place];Living will [place];Living will [place];Living will [place];Living will - - -  Does patient want to make changes to medical advance directive? - No - Patient declined - - - - -  Copy of [place] in Chart? Yes No - copy requested - - - - -    Tobacco Social History   Tobacco Use  Smoking Status Never Smoker  Smokeless Tobacco Never Used  Tobacco Comment   smoking cessation materials not required     Counseling given: No Comment: smoking cessation materials not required  Clinical Intake:  Pre-visit preparation completed: Yes  Pain : No/denies pain   BMI - recorded: 19.96 Nutritional Status: BMI <19  Underweight Nutritional Risks: None Diabetes: No  How often do you need to have someone help you when you read instructions, pamphlets, or other written materials from your doctor or pharmacy?: 1 - Never  Interpreter Needed?: No  Information entered by :: AEversole, LPN  Past Medical History:  Diagnosis Date  . Arthritis   . Atherosclerosis   . Atherosclerosis of abdominal aorta ([place])   . Chronic kidney disease    stage 2  . CKD (chronic kidney  disease) stage 2, GFR 60-89 ml/min   . Collagen vascular disease ([place])   . Diverticulosis 2008  . GERD (gastroesophageal reflux disease)   . Heart murmur   . Hyperlipidemia   . Lumbar spinal stenosis   . Osteoporosis   . Ovarian lump   . RA (rheumatoid arthritis) ([place])   . Raynaud's disease   . Rheumatoid arthritis ([place])   . Spinal stenosis    Past Surgical History:  Procedure Laterality Date  . BREAST SURGERY Left 1965   Benign biopsy  . CHOLECYSTECTOMY  2005   [place]  . COLONOSCOPY  2008   Dr. Donnella Sham  . EXCISION OF BREAST BIOPSY Left 1965   benign  . EYE SURGERY Bilateral    Cataract Extraction with IOL  . KYPHOPLASTY N/A 12/17/2016   Procedure: KYPHOPLASTY;  Surgeon: Hessie Knows, MD;  Location: [facility] ORS;  Service: Orthopedics;  Laterality: N/A;  l4  . LUMBAR LAMINECTOMY  05/09/2017   L2-L5  . VISCERAL ANGIOGRAPHY N/A 08/15/2017   Procedure: VISCERAL ANGIOGRAPHY;  Surgeon: Algernon Huxley, MD;  Location: [facility];  Service: Cardiovascular;  Laterality: N/A;  . VISCERAL ANGIOGRAPHY N/A 10/05/2017   Procedure: VISCERAL ANGIOGRAPHY;  Surgeon: Algernon Huxley, MD;  Location: [facility];  Service: Cardiovascular;  Laterality: N/A;  . VISCERAL ARTERY INTERVENTION N/A 08/15/2017   Procedure: VISCERAL ARTERY INTERVENTION;  Surgeon: Algernon Huxley, MD;  Location: [facility];  Service: Cardiovascular;  Laterality: N/A;   Family History  Problem Relation Age of Onset  . Heart disease Mother   . Hypertension Father   . Heart disease Brother 71       heart attack  . Cancer Brother        bladder  . Diabetes Sister   . Alcohol abuse Sister   . Kidney disease Sister   . Diabetes Brother   . Stroke Brother   . Diabetes Maternal Grandmother   . Heart disease Maternal Grandmother   . Cancer Brother        lung   Social History   Socioeconomic History  . Marital status: Widowed    Spouse name: Gwyndolyn Saxon  . Number of children: 3  . Years of  education: some college  . Highest education level: 12th grade  Occupational History    Employer: RETIRED  Social Needs  . Financial resource strain: Not hard at all  . Food insecurity:    Worry: Never true    Inability: Never true  . Transportation needs:    Medical: No    Non-medical: No  Tobacco Use  . Smoking status: Never Smoker  . Smokeless tobacco: Never Used  . Tobacco comment: smoking cessation materials not required  Substance and Sexual Activity  . Alcohol use: No    Alcohol/week: 0.0 oz  . Drug use: No  . Sexual activity: Not Currently  Lifestyle  . Physical activity:    Days per week: 2 days    Minutes per session: 60 min  . Stress: Not at all  Relationships  . Social connections:    Talks on phone: Patient refused    Gets together: Patient refused    Attends religious service: Patient refused    Active member of club or organization: Patient refused    Attends meetings of clubs or organizations: Patient refused    Relationship status: Widowed  Other Topics Concern  . Not on file  Social History Narrative  . Not on file    Outpatient Encounter Medications as of 04/28/2018  Medication Sig  . acetaminophen (TYLENOL) 500 MG tablet Take 500 mg by mouth every 6 (six) hours as needed for mild pain.  . Ascorbic Acid (VITAMIN C CR) 500 MG CPCR Take 500 mg by mouth daily.  Valente David Yeast 487.5 MG TABS Take 1 tablet by mouth daily.   . Calcium Carbonate-Vitamin D 600-200 MG-UNIT CAPS Take by mouth.  . Cholecalciferol (VITAMIN D3) 3000 UNITS TABS Take 3,000 Units by mouth 2 (two) times daily.   [name] CINNAMON PO Take 1,000 mg by mouth daily.   . clopidogrel (PLAVIX) 75 MG tablet Take 0.5 tablets (37.5 mg total) by mouth daily.  [name] co-enzyme Q-10 30 MG capsule Take 30 mg by mouth daily.   . Ginkgo Biloba (GNP GINGKO BILOBA EXTRACT PO) Take 1 tablet by mouth daily.  . Glucosamine Sulfate 500 MG CAPS Take 500 mg by mouth daily.  [name] ketotifen (ZADITOR) 0.025 % ophthalmic  solution Place 1 drop into both eyes 2 (two) times daily.   . Lactobacillus (PROBIOTIC ACIDOPHILUS PO) Take 1 capsule by mouth daily.   [name] loratadine (CLARITIN) 10 MG tablet Take 10 mg by mouth daily as needed for allergies.  . Magnesium 400 MG CAPS Take 400 mg by mouth daily.  . Multiple Vitamin (MULTIVITAMIN) capsule Take 1 capsule by mouth daily.  [name] neomycin-bacitracin-polymyxin (NEOSPORIN) [id number] ointment Apply 1 application topically daily as needed (itching).   [name]  Omega-3 Fatty Acids (OMEGA 3 PO) Take 520 mg by mouth daily.   . sodium chloride (OCEAN) 0.65 % SOLN nasal spray Place 1 spray into both nostrils every 4 (four) hours as needed for congestion.   . Turmeric 450 MG CAPS Take 450 Units by mouth.  . vitamin B-12 (CYANOCOBALAMIN) 1000 MCG tablet Take 1,000 mcg by mouth daily.  . traMADol (ULTRAM) 50 MG tablet Take by mouth.   No facility-administered encounter medications on file as of 04/28/2018.     Activities of Daily Living In your present state of health, do you have any difficulty performing the following activities: 04/28/2018 04/10/2018  Hearing? N N  Comment denies hearing aids -  Vision? N N  Comment wears contacts -  Difficulty concentrating or making decisions? Y N  Comment short term memory loss -  Walking or climbing stairs? N N  Dressing or bathing? N N  Doing errands, shopping? N N  Preparing Food and eating ? N -  Comment upper and lower partial dentures -  Using the Toilet? N -  In the past six months, have you accidently leaked urine? Y -  Comment urgency -  Do you have problems with loss of bowel control? N -  Managing your Medications? N -  Managing your Finances? N -  Housekeeping or managing your Housekeeping? N -  Some recent data might be hidden    Patient Care Team: Lada, Satira Anis, MD as PCP - General (Family Medicine) Dingeldein, Remo Lipps, MD (Ophthalmology) Clyde Canterbury, MD as Referring Physician (Otolaryngology) Corey Skains, MD as  Consulting Physician (Cardiology) Oneta Rack, MD as Consulting Physician (Dermatology) Lucky Cowboy Erskine Squibb, MD as Consulting Physician (Vascular Surgery)    Assessment:   This is a routine wellness examination for Samyra.  Exercise Activities and Dietary recommendations Current Exercise Habits: The patient does not participate in regular exercise at present, Exercise limited by: None identified  Goals    . DIET - INCREASE WATER INTAKE     Recommend to drink at least 6-8 8oz glasses of water per day.       Fall Risk Fall Risk  04/28/2018 04/10/2018 03/14/2018 04/19/2017 12/24/2016  Falls in the past year? Yes Yes No No Yes  Number falls in past yr: 1 1 - - 2 or more  Injury with Fall? No No - - No  Risk for fall due to : Impaired vision - - - -  Risk for fall due to: Comment wears contacts - - - -  Follow up Falls evaluation completed;Education provided;Falls prevention discussed - - - -   FALL RISK PREVENTION PERTAINING TO HOME: Is your home free of loose throw rugs in walkways, pet beds, electrical cords, etc? Yes Is there adequate lighting in your home to reduce risk of falls?  Yes Are there stairs in or around your home WITH handrails? Yes  ASSISTIVE DEVICES UTILIZED TO PREVENT FALLS: Use of a cane, walker or w/c? No Grab bars in the bathroom? Yes  Shower chair or a place to sit while bathing? Yes An elevated toilet seat or a handicapped toilet? Yes  Timed Get Up and Go Performed: Yes. Pt ambulated 10 feet within 8 sec. Gait stead-fast and without the use of an assistive device. No intervention required at this time. Fall risk prevention has been discussed.  Community Resource Referral:  [place] Referral not required at this time.   Depression Screen PHQ 2/9 Scores 04/28/2018 04/10/2018 03/14/2018 04/19/2017  PHQ -  2 Score 0 0 0 0  PHQ- 9 Score 0 - - -     Cognitive Function     6CIT Screen 04/28/2018 04/19/2017  What Year? 0 points 0 points  What month? 0 points 0  points  What time? 0 points 0 points  Count back from 20 0 points 0 points  Months in reverse 0 points 0 points  Repeat phrase 2 points 0 points  Total Score 2 0    Immunization History  Administered Date(s) Administered  . Influenza,inj,Quad PF,6+ Mos 08/15/2015  . Influenza-Unspecified 08/20/2011, 08/11/2012, 09/08/2013, 09/05/2014, 08/15/2015, 09/13/2016, 08/25/2017  . Pneumococcal Conjugate-13 12/13/2014  . Pneumococcal Polysaccharide-23 11/08/2000, 08/08/2006  . Tdap 11/08/2008  . Zoster 01/07/2004, 11/08/2008    Qualifies for Shingles Vaccine? Yes. Zostavax completed 11/08/08. Due for Shingrix. Education has been provided regarding the importance of this vaccine. Pt has been advised to call her insurance company to determine her out of pocket expense. Advised she may also receive this vaccine at her local pharmacy or Health Dept. Verbalized acceptance and understanding.  Screening Tests Health Maintenance  Topic Date Due  . INFLUENZA VACCINE  06/08/2018  . TETANUS/TDAP  11/08/2018  . DEXA SCAN  Completed  . PNA vac Low Risk Adult  Completed    Cancer Screenings: Lung: Low Dose CT Chest recommended if Age 10-80 years, 30 pack-year currently smoking OR have quit w/in 15years. Patient does not qualify. Breast Screening: No longer required Bone Density/Dexa: No longer required Colorectal: No longer required  Additional Screenings: Hepatitis C Screening: Does not qualify    Plan:  I have personally reviewed and addressed the Medicare Annual Wellness questionnaire and have noted the following in the patient's chart:  A. Medical and social history B. Use of alcohol, tobacco or illicit drugs  C. Current medications and supplements D. Functional ability and status E.  Nutritional status F.  Physical activity G. Advance directives H. List of other physicians I.  Hospitalizations, surgeries, and ER visits in previous 12 months J.  [place] such as hearing and  vision if needed, cognitive and depression L. Referrals and appointments  In addition, I have reviewed and discussed with patient certain preventive protocols, quality metrics, and best practice recommendations. A written personalized care plan for preventive services as well as general preventive health recommendations were provided to patient.  See attached scanned questionnaire for additional information.   Signed,  Aleatha Borer, LPN Nurse Health Advisor

## 2018-06-05 DIAGNOSIS — M19041 Primary osteoarthritis, right hand: Secondary | ICD-10-CM | POA: Diagnosis not present

## 2018-06-05 DIAGNOSIS — I7300 Raynaud's syndrome without gangrene: Secondary | ICD-10-CM | POA: Diagnosis not present

## 2018-06-05 DIAGNOSIS — M19042 Primary osteoarthritis, left hand: Secondary | ICD-10-CM

## 2018-06-05 DIAGNOSIS — R768 Other specified abnormal immunological findings in serum: Secondary | ICD-10-CM | POA: Diagnosis not present

## 2018-06-12 DIAGNOSIS — H109 Unspecified conjunctivitis: Secondary | ICD-10-CM | POA: Diagnosis not present

## 2018-06-26 DIAGNOSIS — I2510 Atherosclerotic heart disease of native coronary artery without angina pectoris: Secondary | ICD-10-CM | POA: Diagnosis not present

## 2018-06-26 DIAGNOSIS — I10 Essential (primary) hypertension: Secondary | ICD-10-CM | POA: Diagnosis not present

## 2018-06-26 DIAGNOSIS — I700 Atherosclerosis of aorta: Secondary | ICD-10-CM | POA: Diagnosis not present

## 2018-06-26 DIAGNOSIS — E782 Mixed hyperlipidemia: Secondary | ICD-10-CM | POA: Diagnosis not present

## 2018-07-21 ENCOUNTER — Ambulatory Visit (INDEPENDENT_AMBULATORY_CARE_PROVIDER_SITE_OTHER): Payer: Medicare Other

## 2018-07-21 ENCOUNTER — Encounter (INDEPENDENT_AMBULATORY_CARE_PROVIDER_SITE_OTHER): Payer: Self-pay | Admitting: Vascular Surgery

## 2018-07-21 ENCOUNTER — Ambulatory Visit (INDEPENDENT_AMBULATORY_CARE_PROVIDER_SITE_OTHER): Payer: Medicare Other | Admitting: Vascular Surgery

## 2018-07-21 ENCOUNTER — Encounter (INDEPENDENT_AMBULATORY_CARE_PROVIDER_SITE_OTHER): Payer: Self-pay

## 2018-07-21 VITALS — BP 141/78 | HR 57 | Resp 14 | Ht 60.0 in | Wt 100.0 lb

## 2018-07-21 DIAGNOSIS — I771 Stricture of artery: Secondary | ICD-10-CM

## 2018-07-21 DIAGNOSIS — N183 Chronic kidney disease, stage 3 unspecified: Secondary | ICD-10-CM

## 2018-07-21 DIAGNOSIS — I774 Celiac artery compression syndrome: Secondary | ICD-10-CM | POA: Diagnosis not present

## 2018-07-21 DIAGNOSIS — I700 Atherosclerosis of aorta: Secondary | ICD-10-CM | POA: Diagnosis not present

## 2018-07-21 DIAGNOSIS — E782 Mixed hyperlipidemia: Secondary | ICD-10-CM

## 2018-07-21 NOTE — Progress Notes (Signed)
MRN : [id number]  [name] is a 84 y.o. (Mar 29, 1927) female who presents with chief complaint of  Chief Complaint  Patient presents with  . Follow-up    6 month mesenteric  .  History of Present Illness: Patient returns today in follow up of her celiac artery stent.  About a year ago, she underwent celiac artery stent placement for abdominal pain with stenosis of the celiac artery.  Although this may have helped some, she continues to have abdominal pain that has been unclear in its etiology.  She is undergone an MRI since her last visit which I have reviewed and it does not find the cause of the pain either.  She had some mild aortic atherosclerosis which was already known.  Her celiac artery stent today is patent on duplex and she has no hemodynamically significant stenosis of the SMA or IMA.  Current Outpatient Medications  Medication Sig Dispense Refill  . acetaminophen (TYLENOL) 500 MG tablet Take 500 mg by mouth every 6 (six) hours as needed for mild pain.    . Ascorbic Acid (VITAMIN C CR) 500 MG CPCR Take 500 mg by mouth daily.    [name] aspirin EC 81 MG tablet Take by mouth.    Valente David Yeast 487.5 MG TABS Take 1 tablet by mouth daily.     . Calcium Carbonate-Vitamin D 600-200 MG-UNIT CAPS Take by mouth.    . Cholecalciferol (VITAMIN D3) 3000 UNITS TABS Take 3,000 Units by mouth 2 (two) times daily.     [name] CINNAMON PO Take 1,000 mg by mouth daily.     . clopidogrel (PLAVIX) 75 MG tablet Take 0.5 tablets (37.5 mg total) by mouth daily.    . clotrimazole-betamethasone (LOTRISONE) cream continuously as needed.    [name] co-enzyme Q-10 30 MG capsule Take 30 mg by mouth daily.     . Ginkgo Biloba (GNP GINGKO BILOBA EXTRACT PO) Take 1 tablet by mouth daily.    . Glucosamine Sulfate 500 MG CAPS Take 500 mg by mouth daily.    [name] ketotifen (ZADITOR) 0.025 % ophthalmic solution Place 1 drop into both eyes 2 (two) times daily.     . Lactobacillus (PROBIOTIC ACIDOPHILUS PO) Take 1 capsule  by mouth daily.     [name] loratadine (CLARITIN) 10 MG tablet Take 10 mg by mouth daily as needed for allergies.    [name] lovastatin (MEVACOR) 40 MG tablet Take by mouth.    . Magnesium 400 MG CAPS Take 400 mg by mouth daily.    . Multiple Vitamin (MULTIVITAMIN) capsule Take 1 capsule by mouth daily.    [name] neomycin-bacitracin-polymyxin (NEOSPORIN) [phone] ointment Apply 1 application topically daily as needed (itching).     . Omega-3 Fatty Acids (OMEGA 3 PO) Take 520 mg by mouth daily.     . Pramoxine-HC (HYDROCORTISONE ACE-PRAMOXINE) 2.5-1 % CREA Apply topically.    . sodium chloride (OCEAN) 0.65 % SOLN nasal spray Place 1 spray into both nostrils every 4 (four) hours as needed for congestion.     . traMADol (ULTRAM) 50 MG tablet Take by mouth.    . Turmeric 450 MG CAPS Take 450 Units by mouth.    . vitamin B-12 (CYANOCOBALAMIN) 1000 MCG tablet Take 1,000 mcg by mouth daily.     No current facility-administered medications for this visit.     Past Medical History:  Diagnosis Date  . Arthritis   . Atherosclerosis   . Atherosclerosis of abdominal aorta ([place])   .  Chronic kidney disease    stage 2  . CKD (chronic kidney disease) stage 2, GFR 60-89 ml/min   . Collagen vascular disease ([place])   . Diverticulosis 2008  . GERD (gastroesophageal reflux disease)   . Heart murmur   . Hyperlipidemia   . Lumbar spinal stenosis   . Osteoporosis   . Ovarian lump   . RA (rheumatoid arthritis) ([place])   . Raynaud's disease   . Rheumatoid arthritis ([place])   . Spinal stenosis     Past Surgical History:  Procedure Laterality Date  . BREAST SURGERY Left 1965   Benign biopsy  . CHOLECYSTECTOMY  2005   Painter  . COLONOSCOPY  2008   Dr. Donnella Sham  . EXCISION OF BREAST BIOPSY Left 1965   benign  . EYE SURGERY Bilateral    Cataract Extraction with IOL  . KYPHOPLASTY N/A 12/17/2016   Procedure: KYPHOPLASTY;  Surgeon: Hessie Knows, MD;  Location: [facility] ORS;  Service: Orthopedics;  Laterality: N/A;  l4  .  LUMBAR LAMINECTOMY  05/09/2017   L2-L5  . VISCERAL ANGIOGRAPHY N/A 08/15/2017   Procedure: VISCERAL ANGIOGRAPHY;  Surgeon: Algernon Huxley, MD;  Location: [place] [facility];  Service: Cardiovascular;  Laterality: N/A;  . VISCERAL ANGIOGRAPHY N/A 10/05/2017   Procedure: VISCERAL ANGIOGRAPHY;  Surgeon: Algernon Huxley, MD;  Location: [place] [facility];  Service: Cardiovascular;  Laterality: N/A;  . VISCERAL ARTERY INTERVENTION N/A 08/15/2017   Procedure: VISCERAL ARTERY INTERVENTION;  Surgeon: Algernon Huxley, MD;  Location: [place] [facility];  Service: Cardiovascular;  Laterality: N/A;    Social History        Tobacco Use  . Smoking status: Never Smoker  . Smokeless tobacco: Never Used  Substance Use Topics  . Alcohol use: No    Alcohol/week: 0.0 oz  . Drug use: No    Family History      Family History  Problem Relation Age of Onset  . Heart disease Mother   . Hypertension Father   . Heart disease Brother 70   heart attack  . Cancer Brother    bladder  . Diabetes Sister   . Alcohol abuse Sister   . Kidney disease Sister   . Diabetes Brother   . Stroke Brother   . Diabetes Maternal Grandmother   . Heart disease Maternal Grandmother   . Cancer Brother    lung         Allergies  Allergen Reactions  . Ibandronic Acid Other (See Comments)    Achy all over. Flu like S/S  . Other Other (See Comments)    Achy all over. Flu like S/S Dysphagia  . Actonel [Risedronate] Other (See Comments)    Dysphagia  . Raloxifene Other (See Comments)    Mood swings  . Versed [Midazolam] Other (See Comments)    Difficult waking up and memory loss  . Doxycycline Rash  . Prednisone Rash    REVIEW OF SYSTEMS(Negative unless checked)  Constitutional: [x] Weight loss[] Fever[] Chills Cardiac:[] Chest pain[] Chest pressure[x] Palpitations [] Shortness of breath when laying flat [] Shortness of breath at rest [] Shortness  of breath with exertion. Vascular: [] Pain in legs with walking[] Pain in legsat rest[] Pain in legs when laying flat [] Claudication [] Pain in feet when walking [] Pain in feet at rest [] Pain in feet when laying flat [] History of DVT [] Phlebitis [] Swelling in legs [x] Varicose veins [] Non-healing ulcers Pulmonary: [] Uses home oxygen [] Productive cough[] Hemoptysis [] Wheeze [] COPD [] Asthma Neurologic: [] Dizziness [] Blackouts [] Seizures [] History of stroke [] History of TIA[] Aphasia [] Temporary blindness[] Dysphagia [] Weaknessor numbness in arms []   Weakness or numbnessin legs Musculoskeletal: [x] Arthritis [] Joint swelling [] Joint pain [x] Low back pain Hematologic:[] Easy bruising[] Easy bleeding [] Hypercoagulable state [] Anemic  Gastrointestinal:[] Blood in stool[] Vomiting blood[x] Gastroesophageal reflux/heartburn[x] Abdominal pain Genitourinary: [x] Chronic kidney disease [] Difficulturination [] Frequenturination [] Burning with urination[] Hematuria Skin: [] Rashes [] Ulcers [] Wounds Psychological: [x] History of anxiety[] History of major depression.    Physical Examination  BP (!) 141/78 (BP Location: Right Arm, Patient Position: Sitting)   Pulse (!) 57   Resp 14   Ht 5' (1.524 m)   Wt 100 lb (45.4 kg)   BMI 19.53 kg/m  Gen:  WD/WN, NAD.  Appears younger than stated age Head: [place]/AT, No temporalis wasting. Ear/Nose/Throat: Hearing grossly intact, nares w/o erythema or drainage Eyes: Conjunctiva clear. Sclera non-icteric Neck: Supple.  Trachea midline Pulmonary:  Good air movement, no use of accessory muscles.  Cardiac: RRR, no JVD Vascular:  Vessel Right Left  Radial Palpable Palpable                                    Musculoskeletal: M/S 5/5 throughout.  No deformity or atrophy.  Neurologic: Sensation grossly intact in extremities.  Symmetrical.  Speech is fluent.  Psychiatric: Judgment  intact, Mood & affect appropriate for pt's clinical situation. Dermatologic: No rashes or ulcers noted.  No cellulitis or open wounds.       Labs No results found for this or any previous visit (from the past 2160 hour(s)).  Radiology No results found.  Assessment/Plan Hyperlipidemia lipid control important in reducing the progression of atherosclerotic disease.  Chronic kidney disease, stage III (moderate) Given the need to limit contrast, we willbe cautious with any procedures.  Atherosclerosis of abdominal aorta (HCC) Known and not the cause of her abdominal pain  Celiac artery stenosis (HCC) Her celiac artery stent today is patent on duplex and she has no hemodynamically significant stenosis of the SMA or IMA. I do not have a good cause of her abdominal pain.  She clearly does not have chronic visceral ischemia at this point.  We will recheck her celiac stent in 1 year with duplex    Leotis Pain, MD  07/21/2018 9:10 AM    This note was created with Dragon medical transcription system.  Any errors from dictation are purely unintentional

## 2018-07-21 NOTE — Assessment & Plan Note (Signed)
Known and not the cause of her abdominal pain

## 2018-07-21 NOTE — Assessment & Plan Note (Signed)
Her celiac artery stent today is patent on duplex and she has no hemodynamically significant stenosis of the SMA or IMA. I do not have a good cause of her abdominal pain.  She clearly does not have chronic visceral ischemia at this point.  We will recheck her celiac stent in 1 year with duplex

## 2018-08-16 DIAGNOSIS — H0014 Chalazion left upper eyelid: Secondary | ICD-10-CM | POA: Diagnosis not present

## 2018-08-30 DIAGNOSIS — H0014 Chalazion left upper eyelid: Secondary | ICD-10-CM | POA: Diagnosis not present

## 2018-08-31 DIAGNOSIS — H6123 Impacted cerumen, bilateral: Secondary | ICD-10-CM | POA: Diagnosis not present

## 2018-08-31 DIAGNOSIS — H903 Sensorineural hearing loss, bilateral: Secondary | ICD-10-CM | POA: Diagnosis not present

## 2018-08-31 DIAGNOSIS — J301 Allergic rhinitis due to pollen: Secondary | ICD-10-CM | POA: Diagnosis not present

## 2018-08-31 DIAGNOSIS — H00019 Hordeolum externum unspecified eye, unspecified eyelid: Secondary | ICD-10-CM | POA: Diagnosis not present

## 2018-09-12 DIAGNOSIS — Z23 Encounter for immunization: Secondary | ICD-10-CM | POA: Diagnosis not present

## 2018-09-25 ENCOUNTER — Other Ambulatory Visit (INDEPENDENT_AMBULATORY_CARE_PROVIDER_SITE_OTHER): Payer: Self-pay | Admitting: Vascular Surgery

## 2018-09-25 DIAGNOSIS — H0012 Chalazion right lower eyelid: Secondary | ICD-10-CM | POA: Diagnosis not present

## 2018-10-03 DIAGNOSIS — H0014 Chalazion left upper eyelid: Secondary | ICD-10-CM | POA: Diagnosis not present

## 2018-10-11 ENCOUNTER — Ambulatory Visit: Payer: Medicare Other | Admitting: Family Medicine

## 2018-10-23 ENCOUNTER — Ambulatory Visit: Payer: Medicare Other | Admitting: Family Medicine

## 2018-11-02 ENCOUNTER — Ambulatory Visit: Payer: Medicare Other | Admitting: Family Medicine

## 2018-11-03 ENCOUNTER — Encounter: Payer: Self-pay | Admitting: Family Medicine

## 2018-11-03 ENCOUNTER — Ambulatory Visit (INDEPENDENT_AMBULATORY_CARE_PROVIDER_SITE_OTHER): Payer: Medicare Other | Admitting: Family Medicine

## 2018-11-03 VITALS — BP 120/62 | HR 81 | Temp 98.2°F | Ht 60.0 in | Wt 102.1 lb

## 2018-11-03 DIAGNOSIS — E782 Mixed hyperlipidemia: Secondary | ICD-10-CM | POA: Diagnosis not present

## 2018-11-03 DIAGNOSIS — R739 Hyperglycemia, unspecified: Secondary | ICD-10-CM | POA: Diagnosis not present

## 2018-11-03 DIAGNOSIS — I774 Celiac artery compression syndrome: Secondary | ICD-10-CM

## 2018-11-03 DIAGNOSIS — I700 Atherosclerosis of aorta: Secondary | ICD-10-CM | POA: Diagnosis not present

## 2018-11-03 DIAGNOSIS — I771 Stricture of artery: Secondary | ICD-10-CM

## 2018-11-03 DIAGNOSIS — H04129 Dry eye syndrome of unspecified lacrimal gland: Secondary | ICD-10-CM

## 2018-11-03 DIAGNOSIS — R1312 Dysphagia, oropharyngeal phase: Secondary | ICD-10-CM | POA: Diagnosis not present

## 2018-11-03 DIAGNOSIS — R1031 Right lower quadrant pain: Secondary | ICD-10-CM | POA: Diagnosis not present

## 2018-11-03 NOTE — Assessment & Plan Note (Signed)
Managed by vascular doctor 

## 2018-11-03 NOTE — Patient Instructions (Addendum)
Please return for labs in a week or two Dr. Lucky Cowboy plans to look at your carotids next visit We'll check you sugar with your labs so please come fasting You don't need an appointment. Just come by between the hours of 8:00 am and noon or between 2:00 pm and 4:00 pm Monday through Friday. We'll have you see the speech therapist You will be due for another scan of your pancreas in May 2021

## 2018-11-03 NOTE — Assessment & Plan Note (Signed)
Goal LDL less than 70; on 10 mg of statin; patient will return to get labs done

## 2018-11-03 NOTE — Assessment & Plan Note (Signed)
Check lipids in a few weeks ago; limit saturated fats

## 2018-11-03 NOTE — Progress Notes (Signed)
BP 120/62   Pulse 81   Temp 98.2 F (36.8 C)   Ht 5' (1.524 m)   Wt 102 lb 1.6 oz (46.3 kg)   SpO2 95%   BMI 19.94 kg/m    Subjective:    Patient ID: [name], female    DOB: May 24, 1927, 84 y.o.   MRN: [id number]  HPI: [name] is a 84 y.o. female  Chief Complaint  Patient presents with  . Follow-up    HPI Patient is here for f/u; she saw Dr. Nehemiah Massed in August; then saw Dr. Lucky Cowboy in September; saw Dr. Thomasene Ripple (ophthalmology), temporary blindness  High cholesterol, aortic athersclerosis, coronary artery disease, PVD, carotid athersclerosis in his last note, but patient has not had her neck scanned any time recently; she says Dr. Lucky Cowboy did not do one at his office that she recalls; she did have lifeline screening; patient opted to take the 10 mg originally prescribed by Dr. Lucky Cowboy instead of the higher dose 40 mg from Dr. Nehemiah Massed; she does have aches at times, shoulders and arms; uses creams topically; taking plavix for the plaque (from Dr. Lucky Cowboy at first); stayed so weak she could hardly go; she cut it in half and that helped the weakness; he advised her to get off of the plavix and just take an aspirin 81 mg daily, still having some bruising but the weakness is tremendously better; I asked if Dr. Lucky Cowboy knew she wasn't taking plavix any more; she isn't sure if they talked about it  She says no one has checked her cholesterol in a while  Lab Results  Component Value Date   CHOL 221 (H) 03/14/2018   HDL 66 03/14/2018   [place] 133 (H) 03/14/2018   TRIG 116 03/14/2018   CHOLHDL 3.3 03/14/2018   Allergic rhinitis and conjunctivitis; this was a bad year for her allergies; dust; no food allergies; not allergic to cats or doors; has a [place] across from her, more things outside than inside; nothing interferes with her ability to urinate  Prediabetes; last A1c 6.1; having dry mouth; sister and brother and grandmother had diabetes; she likes potatoes and bread  and rice, lots of carbs; if she eats something grainy, just a speck of grain may stick in her throat; she has a speech therapist in her family, watching her for this; thinks she might need thickened liquids; she gulps when she drinks and then coughs and gets strangled  Discomfort along the abdomen; epigastric area and down to the umbilical area, then to the right; stent is right over the spine; has a little spot at the navel that gets sore; funny feeling, like a simmering movement; right side gets her attention at times; sometimes right side goes under the ribcage; she had an MRI that did not show anything; not severe pain; reviewed last note from vascular doctor  She feels like her eyes are redness; she has artificial tears; eyes feels dry; dry mouth as well  She did see a gynecologist and had a pap smear and everything was normal; she was great, no problem there  Due for rescan of pancreaet in May 2021  Depression screen [facility] 2/9 11/03/2018 04/28/2018 04/10/2018 03/14/2018 04/19/2017  Decreased Interest 0 0 0 0 0  Down, Depressed, Hopeless 0 0 0 0 0  PHQ - 2 Score 0 0 0 0 0  Altered sleeping 0 0 - - -  Tired, decreased energy 0 0 - - -  Change in appetite  0 0 - - -  Feeling bad or failure about yourself  0 0 - - -  Trouble concentrating 0 0 - - -  Moving slowly or fidgety/restless 0 0 - - -  Suicidal thoughts 0 0 - - -  PHQ-9 Score 0 0 - - -  Difficult doing work/chores Not difficult at all Not difficult at all - - -   Fall Risk  11/03/2018 04/28/2018 04/10/2018 03/14/2018 04/19/2017  Falls in the past year? 0 Yes Yes No No  Number falls in past yr: - 1 1 - -  Injury with Fall? - No No - -  Risk for fall due to : - Impaired vision - - -  Risk for fall due to: Comment - wears contacts - - -  Follow up - Falls evaluation completed;Education provided;Falls prevention discussed - - -    Relevant past medical, surgical, family and social history reviewed Past Medical History:  Diagnosis Date  .  Arthritis   . Atherosclerosis   . Atherosclerosis of abdominal aorta ([place])   . Chronic kidney disease    stage 2  . CKD (chronic kidney disease) stage 2, GFR 60-89 ml/min   . Collagen vascular disease ([place])   . Diverticulosis 2008  . GERD (gastroesophageal reflux disease)   . Heart murmur   . Hyperlipidemia   . Lumbar spinal stenosis   . Osteoporosis   . Ovarian lump   . RA (rheumatoid arthritis) ([place])   . Raynaud's disease   . Rheumatoid arthritis ([place])   . Spinal stenosis    Past Surgical History:  Procedure Laterality Date  . BREAST SURGERY Left 1965   Benign biopsy  . CHOLECYSTECTOMY  2005   [place]  . COLONOSCOPY  2008   Dr. Donnella Sham  . EXCISION OF BREAST BIOPSY Left 1965   benign  . EYE SURGERY Bilateral    Cataract Extraction with IOL  . KYPHOPLASTY N/A 12/17/2016   Procedure: KYPHOPLASTY;  Surgeon: Hessie Knows, MD;  Location: [facility] ORS;  Service: Orthopedics;  Laterality: N/A;  l4  . LUMBAR LAMINECTOMY  05/09/2017   L2-L5  . VISCERAL ANGIOGRAPHY N/A 08/15/2017   Procedure: VISCERAL ANGIOGRAPHY;  Surgeon: Algernon Huxley, MD;  Location: [place] [facility];  Service: Cardiovascular;  Laterality: N/A;  . VISCERAL ANGIOGRAPHY N/A 10/05/2017   Procedure: VISCERAL ANGIOGRAPHY;  Surgeon: Algernon Huxley, MD;  Location: [place] [facility];  Service: Cardiovascular;  Laterality: N/A;  . VISCERAL ARTERY INTERVENTION N/A 08/15/2017   Procedure: VISCERAL ARTERY INTERVENTION;  Surgeon: Algernon Huxley, MD;  Location: [place] [facility];  Service: Cardiovascular;  Laterality: N/A;   Family History  Problem Relation Age of Onset  . Heart disease Mother   . Hypertension Father   . Heart disease Brother 76       heart attack  . Cancer Brother        bladder  . Diabetes Sister   . Alcohol abuse Sister   . Kidney disease Sister   . Diabetes Brother   . Stroke Brother   . Diabetes Maternal Grandmother   . Heart disease Maternal Grandmother   . Cancer Brother        lung    Social History   Tobacco Use  . Smoking status: Never Smoker  . Smokeless tobacco: Never Used  . Tobacco comment: smoking cessation materials not required  Substance Use Topics  . Alcohol use: No    Alcohol/week: 0.0 standard drinks  . Drug  use: No     [facility] Visit from 11/03/2018 in [facility]  AUDIT-C Score  0      Interim medical history since last visit reviewed. Allergies and medications reviewed  Review of Systems Per HPI unless specifically indicated above     Objective:    BP 120/62   Pulse 81   Temp 98.2 F (36.8 C)   Ht 5' (1.524 m)   Wt 102 lb 1.6 oz (46.3 kg)   SpO2 95%   BMI 19.94 kg/m   Wt Readings from Last 3 Encounters:  11/03/18 102 lb 1.6 oz (46.3 kg)  07/21/18 100 lb (45.4 kg)  04/28/18 102 lb 3.2 oz (46.4 kg)    Physical Exam Constitutional:      General: She is not in acute distress.    Appearance: She is well-developed. She is not diaphoretic.  HENT:     Head: Normocephalic and atraumatic.  Eyes:     General: No scleral icterus. Neck:     Thyroid: No thyromegaly.     Vascular: No carotid bruit.  Cardiovascular:     Rate and Rhythm: Normal rate and regular rhythm.     Heart sounds: Normal heart sounds. No murmur.  Pulmonary:     Effort: Pulmonary effort is normal. No respiratory distress.     Breath sounds: Normal breath sounds. No wheezing.  Abdominal:     General: There is no distension.  Skin:    General: Skin is warm and dry.     Coloration: Skin is not pale.  Neurological:     Mental Status: She is alert.  Psychiatric:        Behavior: Behavior normal.        Thought Content: Thought content normal.        Judgment: Judgment normal.     Results for orders placed or performed in visit on 03/14/18  CBC with Differential/Platelet  Result Value Ref Range   WBC 4.4 3.8 - 10.8 Thousand/uL   RBC 4.92 3.80 - 5.10 Million/uL   Hemoglobin 14.6 11.7 - 15.5 g/dL   HCT 42.4 35.0 - 45.0 %   MCV 86.2 80.0  - 100.0 fL   MCH 29.7 27.0 - 33.0 pg   MCHC 34.4 32.0 - 36.0 g/dL   RDW 13.1 11.0 - 15.0 %   Platelets 223 140 - 400 Thousand/uL   MPV 11.1 7.5 - 12.5 fL   Neutro Abs 2,913 1,500 - 7,800 cells/uL   Lymphs Abs 928 850 - 3,900 cells/uL   WBC mixed population 488 200 - 950 cells/uL   Eosinophils Absolute 31 15 - 500 cells/uL   Basophils Absolute 40 0 - 200 cells/uL   Neutrophils Relative % 66.2 %   Total Lymphocyte 21.1 %   Monocytes Relative 11.1 %   Eosinophils Relative 0.7 %   Basophils Relative 0.9 %  COMPLETE METABOLIC PANEL WITH GFR  Result Value Ref Range   Glucose, Bld 84 65 - 139 mg/dL   BUN 15 7 - 25 mg/dL   Creat 0.62 0.60 - 0.88 mg/dL   GFR, Est Non African American 81 > OR = 60 mL/min/1.73m2   GFR, Est African American 93 > OR = 60 mL/min/1.60m2   BUN/Creatinine Ratio NOT APPLICABLE 6 - 22 (calc)   Sodium 138 135 - 146 mmol/L   Potassium 4.7 3.5 - 5.3 mmol/L   Chloride 102 98 - 110 mmol/L   CO2 33 (H) 20 - 32 mmol/L   Calcium  9.5 8.6 - 10.4 mg/dL   Total Protein 6.9 6.1 - 8.1 g/dL   Albumin 4.2 3.6 - 5.1 g/dL   Globulin 2.7 1.9 - 3.7 g/dL (calc)   AG Ratio 1.6 1.0 - 2.5 (calc)   Total Bilirubin 0.5 0.2 - 1.2 mg/dL   Alkaline phosphatase (APISO) 64 33 - 130 U/L   AST 34 10 - 35 U/L   ALT 21 6 - 29 U/L  Hemoglobin A1c  Result Value Ref Range   Hgb A1c MFr Bld 6.1 (H) <5.7 % of total Hgb   Mean Plasma Glucose 128 (calc)   eAG (mmol/L) 7.1 (calc)  Lipid panel  Result Value Ref Range   Cholesterol 221 (H) <200 mg/dL   HDL 66 >50 mg/dL   Triglycerides 116 <150 mg/dL   LDL Cholesterol (Calc) 133 (H) mg/dL (calc)   Total CHOL/HDL Ratio 3.3 <5.0 (calc)   Non-HDL Cholesterol (Calc) 155 (H) <130 mg/dL (calc)  ANA,IFA RA Diag Pnl w/rflx Tit/Patn  Result Value Ref Range   Anti Nuclear Antibody(ANA) POSITIVE (A) NEGATIVE   Rhuematoid fact SerPl-aCnc 34 (H) <67 IU/mL   Cyclic Citrullin Peptide Ab <16 UNITS   INTERPRETATION    VITAMIN D 25 Hydroxy (Vit-D Deficiency,  Fractures)  Result Value Ref Range   Vit D, 25-Hydroxy 41 30 - 100 ng/mL  Anti-nuclear ab-titer (ANA titer)  Result Value Ref Range   ANA Pattern 1 HOMOGENEOUS (A)    ANA Titer 1 1:80 (H) titer      Assessment & Plan:   Problem List Items Addressed This Visit      Cardiovascular and Mediastinum   Celiac artery stenosis (HCC)    Managed by vascular doctor      Relevant Orders   Lipid panel   Atherosclerosis of abdominal aorta (HCC)    Goal LDL less than 70; on 10 mg of statin; patient will return to get labs done      Relevant Orders   Lipid panel     Other   Abdominal pain   Relevant Orders   CBC with Differential/Platelet   COMPLETE METABOLIC PANEL WITH GFR   Hyperglycemia   Relevant Orders   Hemoglobin A1c   Hyperlipidemia    Check lipids in a few weeks ago; limit saturated fats      Relevant Orders   Lipid panel    Other Visit Diagnoses    Oropharyngeal dysphagia    -  Primary   Relevant Orders   Ambulatory referral to Speech Therapy   Dry eye       Relevant Orders   Sjogren's syndrome antibods(ssa + ssb)       Follow up plan: Return in about 6 months (around 05/05/2019).  An after-visit summary was printed and given to the patient at [place] [facility].  Please see the patient instructions which may contain other information and recommendations beyond what is mentioned above in the assessment and plan.  No orders of the defined types were placed in this encounter.   Orders Placed This Encounter  Procedures  . Sjogren's syndrome antibods(ssa + ssb)  . CBC with Differential/Platelet  . COMPLETE METABOLIC PANEL WITH GFR  . Lipid panel  . Hemoglobin A1c  . Ambulatory referral to Speech Therapy

## 2018-11-07 ENCOUNTER — Other Ambulatory Visit (HOSPITAL_COMMUNITY): Payer: Self-pay

## 2018-11-07 DIAGNOSIS — R1310 Dysphagia, unspecified: Secondary | ICD-10-CM

## 2018-11-07 NOTE — Addendum Note (Signed)
Addended by: LADA, Satira Anis on: 11/07/2018 11:55 AM   Modules accepted: Orders

## 2018-11-23 ENCOUNTER — Ambulatory Visit (HOSPITAL_COMMUNITY): Payer: Medicare Other

## 2018-11-23 ENCOUNTER — Encounter (HOSPITAL_COMMUNITY): Payer: Medicare Other

## 2018-11-28 ENCOUNTER — Ambulatory Visit (HOSPITAL_COMMUNITY)
Admission: RE | Admit: 2018-11-28 | Discharge: 2018-11-28 | Disposition: A | Discharge status: Home / Self Care | Payer: Medicare Other | Source: Ambulatory Visit | Attending: Family Medicine | Admitting: Family Medicine

## 2018-11-28 ENCOUNTER — Ambulatory Visit (HOSPITAL_COMMUNITY): Payer: Medicare Other

## 2018-11-28 DIAGNOSIS — R0989 Other specified symptoms and signs involving the circulatory and respiratory systems: Secondary | ICD-10-CM | POA: Diagnosis not present

## 2018-11-28 DIAGNOSIS — R1312 Dysphagia, oropharyngeal phase: Secondary | ICD-10-CM

## 2018-11-28 DIAGNOSIS — R1310 Dysphagia, unspecified: Secondary | ICD-10-CM | POA: Insufficient documentation

## 2018-11-28 NOTE — Therapy (Signed)
Modified Barium Swallow Progress Note  Patient Details  Name: [name] MRN: [id number] Date of Birth: 05-26-1927  Today's Date: 11/28/2018  Modified Barium Swallow completed.  Full report located under Chart Review in the Imaging Section.  Brief recommendations include the following:  Clinical Impression Pt presents with functional oropharyngeal swallow for her age. Premature spillage is noted to the level of the vallecular sinus, where swallow reflex occurs across consistencies. Trace vallecular residue noted across consistencies, due to decreased tongue base retraction. Trace flash penetration was seen intermittently on thin and nectar thick liquids, which is within functional limits for pt age (85 years old). Penetrate cleared spontaneously and was not aspirated.   Given pt history of GERD, and questionable diagnosis of Sjogren's Syndrome, pt was provided with written information on dietary and behavioral changes to manage esophageal dysmotility, as well as diet suggestions for Sjogren's. Pt denied diagnosis of Sjogrens, and was encouraged to confirm this with PCP.   SLP provided opportunity for pt to ask questions, and was encouraged to notify PCP if further needs arise.   Swallow Evaluation Recommendations  SLP Diet Recommendations: Regular solids;Thin liquid   Liquid Administration via: Cup;Straw   Medication Administration: (per pt preference)   Supervision: Patient able to self feed   Compensations: Minimize environmental distractions;Slow rate;Small sips/bites;Follow solids with liquid   Postural Changes: Remain semi-upright after after feeds/meals (Comment);Seated upright at 90 degrees   Oral Care Recommendations: Oral care BID  Celia B. Quentin Ore, [facility], [place] Speech Language Pathologist [phone]  Shonna Chock 11/28/2018,1:23 PM

## 2018-12-06 DIAGNOSIS — M25511 Pain in right shoulder: Secondary | ICD-10-CM | POA: Diagnosis not present

## 2018-12-06 DIAGNOSIS — R768 Other specified abnormal immunological findings in serum: Secondary | ICD-10-CM | POA: Diagnosis not present

## 2018-12-06 DIAGNOSIS — G8929 Other chronic pain: Secondary | ICD-10-CM | POA: Diagnosis not present

## 2018-12-06 DIAGNOSIS — M19042 Primary osteoarthritis, left hand: Secondary | ICD-10-CM | POA: Diagnosis not present

## 2018-12-06 DIAGNOSIS — I7300 Raynaud's syndrome without gangrene: Secondary | ICD-10-CM | POA: Diagnosis not present

## 2018-12-06 DIAGNOSIS — M19041 Primary osteoarthritis, right hand: Secondary | ICD-10-CM | POA: Diagnosis not present

## 2018-12-06 DIAGNOSIS — M542 Cervicalgia: Secondary | ICD-10-CM | POA: Diagnosis not present

## 2018-12-18 DIAGNOSIS — M5412 Radiculopathy, cervical region: Secondary | ICD-10-CM | POA: Diagnosis not present

## 2018-12-18 DIAGNOSIS — M542 Cervicalgia: Secondary | ICD-10-CM | POA: Diagnosis not present

## 2018-12-21 DIAGNOSIS — M542 Cervicalgia: Secondary | ICD-10-CM | POA: Diagnosis not present

## 2018-12-25 DIAGNOSIS — M542 Cervicalgia: Secondary | ICD-10-CM | POA: Diagnosis not present

## 2019-01-01 ENCOUNTER — Other Ambulatory Visit: Payer: Self-pay

## 2019-01-01 DIAGNOSIS — H04129 Dry eye syndrome of unspecified lacrimal gland: Secondary | ICD-10-CM

## 2019-01-01 DIAGNOSIS — I774 Celiac artery compression syndrome: Secondary | ICD-10-CM

## 2019-01-01 DIAGNOSIS — I700 Atherosclerosis of aorta: Secondary | ICD-10-CM | POA: Diagnosis not present

## 2019-01-01 DIAGNOSIS — E782 Mixed hyperlipidemia: Secondary | ICD-10-CM

## 2019-01-01 DIAGNOSIS — R1031 Right lower quadrant pain: Secondary | ICD-10-CM | POA: Diagnosis not present

## 2019-01-01 DIAGNOSIS — R739 Hyperglycemia, unspecified: Secondary | ICD-10-CM

## 2019-01-01 DIAGNOSIS — I771 Stricture of artery: Secondary | ICD-10-CM

## 2019-01-02 ENCOUNTER — Other Ambulatory Visit: Payer: Self-pay | Admitting: Family Medicine

## 2019-01-02 DIAGNOSIS — R7401 Elevation of levels of liver transaminase levels: Secondary | ICD-10-CM

## 2019-01-02 DIAGNOSIS — R740 Nonspecific elevation of levels of transaminase and lactic acid dehydrogenase [LDH]: Principal | ICD-10-CM

## 2019-01-02 LAB — SJOGREN'S SYNDROME ANTIBODS(SSA + SSB)
SSA (Ro) (ENA) Antibody, IgG: <1 AI
SSB (La) (ENA) Antibody, IgG: <1 AI

## 2019-01-02 LAB — LIPID PANEL
Cholesterol: 158 mg/dL (ref <200)
HDL: 77 mg/dL (ref >=50)
LDL Cholesterol (Calc): 63 mg/dL (calc)
Non-HDL Cholesterol (Calc): 81 mg/dL (calc) (ref <130)
Total CHOL/HDL Ratio: 2.1 (calc) (ref <5.0)
Triglycerides: 96 mg/dL (ref <150)

## 2019-01-02 LAB — CBC WITH DIFFERENTIAL/PLATELET
Absolute Monocytes: 428 cells/uL (ref 200–950)
Basophils Absolute: 61 cells/uL (ref 0–200)
Basophils Relative: 1.3 %
Eosinophils Absolute: 71 cells/uL (ref 15–500)
Eosinophils Relative: 1.5 %
HCT: 42.8 % (ref 35.0–45.0)
Hemoglobin: 14.6 g/dL (ref 11.7–15.5)
Lymphs Abs: 1664 cells/uL (ref 850–3900)
MCH: 30.1 pg (ref 27.0–33.0)
MCHC: 34.1 g/dL (ref 32.0–36.0)
MCV: 88.2 fL (ref 80.0–100.0)
MPV: 11.3 fL (ref 7.5–12.5)
Monocytes Relative: 9.1 %
Neutro Abs: 2477 cells/uL (ref 1500–7800)
Neutrophils Relative %: 52.7 %
Platelets: 232 10*3/uL (ref 140–400)
RBC: 4.85 10*6/uL (ref 3.80–5.10)
RDW: 12.3 % (ref 11.0–15.0)
Total Lymphocyte: 35.4 %
WBC: 4.7 10*3/uL (ref 3.8–10.8)

## 2019-01-02 LAB — HEMOGLOBIN A1C
Hgb A1c MFr Bld: 6.2 % of total Hgb — ABNORMAL HIGH (ref <5.7)
Mean Plasma Glucose: 131 (calc)
eAG (mmol/L): 7.3 (calc)

## 2019-01-02 LAB — COMPLETE METABOLIC PANEL WITH GFR
AG Ratio: 1.7 (calc) (ref 1.0–2.5)
ALT: 24 U/L (ref 6–29)
AST: 38 U/L — ABNORMAL HIGH (ref 10–35)
Albumin: 4.2 g/dL (ref 3.6–5.1)
Alkaline phosphatase (APISO): 55 U/L (ref 37–153)
BUN/Creatinine Ratio: 25 (calc) — ABNORMAL HIGH (ref 6–22)
BUN: 14 mg/dL (ref 7–25)
CO2: 29 mmol/L (ref 20–32)
Calcium: 9.1 mg/dL (ref 8.6–10.4)
Chloride: 104 mmol/L (ref 98–110)
Creat: 0.56 mg/dL — ABNORMAL LOW (ref 0.60–0.88)
GFR, Est African American: 96 mL/min/{1.73_m2} (ref >=60)
GFR, Est Non African American: 83 mL/min/{1.73_m2} (ref >=60)
Globulin: 2.5 g/dL (calc) (ref 1.9–3.7)
Glucose, Bld: 84 mg/dL (ref 65–99)
Potassium: 4.5 mmol/L (ref 3.5–5.3)
Sodium: 140 mmol/L (ref 135–146)
Total Bilirubin: 0.3 mg/dL (ref 0.2–1.2)
Total Protein: 6.7 g/dL (ref 6.1–8.1)

## 2019-01-02 NOTE — Progress Notes (Signed)
[name], please let the patient know that her 3 month blood sugar average is in the "prediabetes" category. Try to eat healthy, limit or avoid white bread, white rice, and sweets. We'll follow this every 6 months. Schedule appointment for that time if not already made. Her cholesterol is wonderful. One liver enzyme is a little above normal. Let's just recheck that in one month. If she drinks alcohol or uses a lot of tylenol, encourage her to cut back. Her CBC is normal. Thank you

## 2019-01-03 NOTE — Progress Notes (Signed)
[name], please let the patient know that her tests for Sjogren's are negative; good news; thank you

## 2019-01-04 DIAGNOSIS — I700 Atherosclerosis of aorta: Secondary | ICD-10-CM | POA: Diagnosis not present

## 2019-01-04 DIAGNOSIS — M542 Cervicalgia: Secondary | ICD-10-CM | POA: Diagnosis not present

## 2019-01-04 DIAGNOSIS — E782 Mixed hyperlipidemia: Secondary | ICD-10-CM | POA: Diagnosis not present

## 2019-01-04 DIAGNOSIS — I2510 Atherosclerotic heart disease of native coronary artery without angina pectoris: Secondary | ICD-10-CM | POA: Diagnosis not present

## 2019-01-09 DIAGNOSIS — M542 Cervicalgia: Secondary | ICD-10-CM | POA: Diagnosis not present

## 2019-01-11 DIAGNOSIS — M542 Cervicalgia: Secondary | ICD-10-CM | POA: Diagnosis not present

## 2019-01-17 ENCOUNTER — Ambulatory Visit
Admission: RE | Admit: 2019-01-17 | Discharge: 2019-01-17 | Disposition: A | Discharge status: Home / Self Care | Payer: Medicare Other | Source: Ambulatory Visit | Attending: Nurse Practitioner | Admitting: Nurse Practitioner

## 2019-01-17 ENCOUNTER — Ambulatory Visit: Payer: Self-pay

## 2019-01-17 ENCOUNTER — Encounter: Payer: Self-pay | Admitting: Nurse Practitioner

## 2019-01-17 ENCOUNTER — Other Ambulatory Visit: Payer: Self-pay

## 2019-01-17 ENCOUNTER — Ambulatory Visit
Admission: RE | Admit: 2019-01-17 | Discharge: 2019-01-17 | Disposition: A | Discharge status: Home / Self Care | Payer: Medicare Other | Attending: Nurse Practitioner | Admitting: Nurse Practitioner

## 2019-01-17 ENCOUNTER — Ambulatory Visit (INDEPENDENT_AMBULATORY_CARE_PROVIDER_SITE_OTHER): Payer: Medicare Other | Admitting: Nurse Practitioner

## 2019-01-17 VITALS — BP 112/68 | HR 77 | Temp 98.0°F | Resp 14 | Ht 60.0 in | Wt 100.6 lb

## 2019-01-17 DIAGNOSIS — R05 Cough: Secondary | ICD-10-CM

## 2019-01-17 DIAGNOSIS — R0602 Shortness of breath: Secondary | ICD-10-CM

## 2019-01-17 DIAGNOSIS — J101 Influenza due to other identified influenza virus with other respiratory manifestations: Secondary | ICD-10-CM | POA: Diagnosis not present

## 2019-01-17 DIAGNOSIS — R52 Pain, unspecified: Secondary | ICD-10-CM | POA: Diagnosis not present

## 2019-01-17 DIAGNOSIS — R079 Chest pain, unspecified: Secondary | ICD-10-CM

## 2019-01-17 DIAGNOSIS — R058 Other specified cough: Secondary | ICD-10-CM

## 2019-01-17 DIAGNOSIS — R5383 Other fatigue: Secondary | ICD-10-CM

## 2019-01-17 LAB — POCT INFLUENZA A/B
Influenza A, POC: POSITIVE — AB
Influenza B, POC: NEGATIVE

## 2019-01-17 MED ORDER — GUAIFENESIN ER 600 MG PO TB12: 600.0000 mg | ORAL_TABLET | Freq: Two times a day (BID) | ORAL | 0 refills | Status: DC

## 2019-01-17 NOTE — Progress Notes (Signed)
Name: [name]   MRN: [id number]    DOB: 1927-02-02   Date:01/17/2019       Progress Note  Subjective  Chief Complaint  Chief Complaint  Patient presents with  . Shortness of Breath    patient feels like this when she walks  . Cough    patient stated that she feels she has the flu. a gentleman that was working on the water system in her house was sick and so she beleive she might have picked something up from him  . Fatigue    patient is very tired and feels very weak.  . Chest Pain    patient also presents with intermittent right upper chest pain for a couple of days    HPI  Patient presents with generalized weakness and strong productive cough with thick white sputum ongoing since Friday. Had some fevers and chills Friday- Sunday, highest was 102F, resolved Sunday. Has largely laid in bed this past weekend. Has been using hylands kids cough and cold medicine. States cough has improved some but today started to have mild shortness of breath and right sided chest discomfort. Patient strongly believes she has the flu, received flu vaccine but had someone who was sick working in her house and think she picked up similar illness.   CBC from 01/01/2019 shows no anemia. Patient has history or atherosclerosis, CAD, LDL at goal. Elmer Bales MD- cards, seen 2 weeks ago stable.  Lab Results  Component Value Date   CHOL 158 01/01/2019   HDL 77 01/01/2019   LDLCALC 63 01/01/2019   TRIG 96 01/01/2019   CHOLHDL 2.1 01/01/2019    Has chronic right shoulder pain follows up with rheumatology and PT; declined cortisone injection at previous visit with rheum.  Patient Active Problem List   Diagnosis Date Noted  . Chronic pain in right shoulder 12/06/2018  . Primary osteoarthritis of both hands 06/05/2018  . Pancreatic cyst 04/10/2018  . Myalgia due to statin 04/10/2018  . Raynaud's phenomenon without gangrene 04/04/2018  . Weakness 03/14/2018  . Varicose veins of leg with pain,  right 09/13/2017  . History of lumbar laminectomy for spinal cord decompression 07/19/2017  . DNAR (do not attempt resuscitation) 04/19/2017  . Compression fracture of L4 lumbar vertebra 12/10/2016  . Celiac artery stenosis ([place]) 12/06/2016  . Abdominal pain 04/09/2016  . Abdominal bloating 04/09/2016  . Ovarian cyst 03/09/2016  . Endometrial thickening on ultra sound 03/09/2016  . Pelvic pain in female 02/29/2016  . Preventative health care 01/03/2016  . Hyperglycemia 12/19/2015  . Subjective change in urination 12/19/2015  . Compression fracture of thoracic spine, non-traumatic ([place]) 08/19/2015  . Chronic thoracic spine pain 08/15/2015  . Low back pain 08/15/2015  . Compression fracture of L2 lumbar vertebra ([place]) 08/15/2015  . Hyperlipidemia   . Osteoporosis   . Rheumatoid arthritis ([place])   . Ovarian lump   . Atherosclerosis of abdominal aorta ([place])   . Lumbar spinal stenosis   . Chronic kidney disease, stage III (moderate) (H[facility])   . CAD (coronary artery disease) 06/24/2014  . History of adenomatous polyp of colon 06/14/2014  . Pulmonary nodule seen on imaging study 07/13/2012  . Irritable bowel syndrome 02/16/2012    Past Medical History:  Diagnosis Date  . Arthritis   . Atherosclerosis   . Atherosclerosis of abdominal aorta ([place])   . Chronic kidney disease    stage 2  . CKD (chronic kidney disease) stage 2, GFR 60-89 ml/min   .  Collagen vascular disease ([place])   . Diverticulosis 2008  . GERD (gastroesophageal reflux disease)   . Heart murmur   . Hyperlipidemia   . Lumbar spinal stenosis   . Osteoporosis   . Ovarian lump   . RA (rheumatoid arthritis) ([place])   . Raynaud's disease   . Rheumatoid arthritis ([place])   . Spinal stenosis     Past Surgical History:  Procedure Laterality Date  . BREAST SURGERY Left 1965   Benign biopsy  . CHOLECYSTECTOMY  2005   [place]  . COLONOSCOPY  2008   Dr. Donnella Sham  . EXCISION OF BREAST BIOPSY Left 1965   benign  . EYE  SURGERY Bilateral    Cataract Extraction with IOL  . KYPHOPLASTY N/A 12/17/2016   Procedure: KYPHOPLASTY;  Surgeon: Hessie Knows, MD;  Location: [facility] ORS;  Service: Orthopedics;  Laterality: N/A;  l4  . LUMBAR LAMINECTOMY  05/09/2017   L2-L5  . VISCERAL ANGIOGRAPHY N/A 08/15/2017   Procedure: VISCERAL ANGIOGRAPHY;  Surgeon: Algernon Huxley, MD;  Location: [facility];  Service: Cardiovascular;  Laterality: N/A;  . VISCERAL ANGIOGRAPHY N/A 10/05/2017   Procedure: VISCERAL ANGIOGRAPHY;  Surgeon: Algernon Huxley, MD;  Location: [place] [facility];  Service: Cardiovascular;  Laterality: N/A;  . VISCERAL ARTERY INTERVENTION N/A 08/15/2017   Procedure: VISCERAL ARTERY INTERVENTION;  Surgeon: Algernon Huxley, MD;  Location[facility];  Service: Cardiovascular;  Laterality: N/A;    Social History   Tobacco Use  . Smoking status: Never Smoker  . Smokeless tobacco: Never Used  . Tobacco comment: smoking cessation materials not required  Substance Use Topics  . Alcohol use: No    Alcohol/week: 0.0 standard drinks     Current Outpatient Medications:  .  acetaminophen (TYLENOL) 500 MG tablet, Take 500 mg by mouth every 6 (six) hours as needed for mild pain., Disp: , Rfl:  .  Ascorbic Acid (VITAMIN C CR) 500 MG CPCR, Take 500 mg by mouth daily., Disp: , Rfl:  .  aspirin EC 81 MG tablet, Take by mouth., Disp: , Rfl:  .  atorvastatin (LIPITOR) 10 MG tablet, TAKE 1 TABLET BY MOUTH IN THE EVENING, Disp: 30 tablet, Rfl: 6 .  Brewers Yeast 487.5 MG TABS, Take 1 tablet by mouth daily. , Disp: , Rfl:  .  Calcium Carbonate-Vitamin D 600-200 MG-UNIT CAPS, Take by mouth., Disp: , Rfl:  .  Cholecalciferol (VITAMIN D3) 3000 UNITS TABS, Take 3,000 Units by mouth 2 (two) times daily. , Disp: , Rfl:  .  CINNAMON PO, Take 1,000 mg by mouth daily. , Disp: , Rfl:  .  clotrimazole-betamethasone (LOTRISONE) cream, continuously as needed., Disp: , Rfl:  .  co-enzyme Q-10 30 MG capsule, Take 30 mg by mouth  daily. , Disp: , Rfl:  .  fluticasone (FLONASE) 50 MCG/ACT nasal spray, Place 1 spray into both nostrils daily., Disp: , Rfl:  .  Ginkgo Biloba (GNP GINGKO BILOBA EXTRACT PO), Take 1 tablet by mouth daily., Disp: , Rfl:  .  Glucosamine Sulfate 500 MG CAPS, Take 500 mg by mouth daily., Disp: , Rfl:  .  ketotifen (ZADITOR) 0.025 % ophthalmic solution, Place 1 drop into both eyes 2 (two) times daily. , Disp: , Rfl:  .  Lactobacillus (PROBIOTIC ACIDOPHILUS PO), Take 1 capsule by mouth daily. , Disp: , Rfl:  .  loratadine (CLARITIN) 10 MG tablet, Take 10 mg by mouth daily as needed for allergies., Disp: , Rfl:  .  Magnesium 400 MG CAPS, Take 400 mg by mouth daily., Disp: , Rfl:  .  Multiple Vitamin (MULTIVITAMIN) capsule, Take 1 capsule by mouth daily., Disp: , Rfl:  .  neomycin-bacitracin-polymyxin (NEOSPORIN) [id number] ointment, Apply 1 application topically daily as needed (itching). , Disp: , Rfl:  .  Omega-3 Fatty Acids (OMEGA 3 PO), Take 520 mg by mouth daily. , Disp: , Rfl:  .  Pramoxine-HC (HYDROCORTISONE ACE-PRAMOXINE) 2.5-1 % CREA, Apply topically as needed. , Disp: , Rfl:  .  sodium chloride (OCEAN) 0.65 % SOLN nasal spray, Place 1 spray into both nostrils every 4 (four) hours as needed for congestion. , Disp: , Rfl:  .  traMADol (ULTRAM) 50 MG tablet, Take by mouth., Disp: , Rfl:  .  Turmeric 450 MG CAPS, Take 450 Units by mouth., Disp: , Rfl:  .  vitamin B-12 (CYANOCOBALAMIN) 1000 MCG tablet, Take 1,000 mcg by mouth daily., Disp: , Rfl:   Allergies  Allergen Reactions  . Ibandronic Acid Other (See Comments)    Achy all over. Flu like S/S  . Other Other (See Comments)    Achy all over. Flu like S/S Dysphagia  . Actonel [Risedronate] Other (See Comments)    Dysphagia  . Raloxifene Other (See Comments)    Mood swings  . Versed [Midazolam] Other (See Comments)    Difficult waking up and memory loss  . Doxycycline Rash  . Prednisone Rash    Review of Systems  Constitutional:  Positive for chills (resolved), fever (resolved) and malaise/fatigue.  HENT: Positive for congestion. Negative for ear discharge, ear pain, sinus pain and sore throat.   Eyes: Negative for blurred vision and discharge.  Respiratory: Positive for cough, sputum production and shortness of breath. Negative for wheezing.   Cardiovascular: Positive for chest pain and palpitations (states when she had the fever noticed intermittent palpitations- has not had it since sunday). Negative for leg swelling.  Gastrointestinal: Negative for abdominal pain, blood in stool, constipation, diarrhea, nausea and vomiting.  Genitourinary: Positive for frequency (chronic issue, unchanged). Negative for dysuria, flank pain, hematuria and urgency.  Musculoskeletal: Positive for joint pain and myalgias. Negative for falls and neck pain.  Skin: Negative for rash.  Neurological: Positive for tingling (chronic in bilateral legs, unchanged) and weakness. Negative for dizziness, sensory change, focal weakness and headaches.  Psychiatric/Behavioral: Negative for depression. The patient is not nervous/anxious and does not have insomnia.     No other specific complaints in a complete review of systems (except as listed in HPI above).  Objective  Vitals:   01/17/19 1134  BP: 112/68  Pulse: 77  Resp: 14  Temp: 98 F (36.7 C)  TempSrc: Oral  SpO2: 95%  Weight: 100 lb 9.6 oz (45.6 kg)  Height: 5' (1.524 m)    Body mass index is 19.65 kg/m.  Nursing Note and Vital Signs reviewed.  Physical Exam Vitals signs reviewed.  Constitutional:      Appearance: She is well-developed. She is not ill-appearing.  HENT:     Head: Normocephalic and atraumatic.     Mouth/Throat:     Pharynx: Oropharynx is clear. No oropharyngeal exudate.  Neck:     Musculoskeletal: Normal range of motion and neck supple.     Vascular: No carotid bruit.  Cardiovascular:     Pulses: Normal pulses.     Heart sounds: Normal heart sounds.   Pulmonary:     Effort: Pulmonary effort is normal. No tachypnea or respiratory distress.     Breath  sounds: Normal breath sounds. No stridor.  Chest:     Chest wall: No tenderness.  Abdominal:     General: Bowel sounds are normal.     Palpations: Abdomen is soft.     Tenderness: There is no abdominal tenderness.  Musculoskeletal: Normal range of motion.  Skin:    General: Skin is warm and dry.     Capillary Refill: Capillary refill takes less than 2 seconds.  Neurological:     Mental Status: She is alert and oriented to person, place, and time.     GCS: GCS eye subscore is 4. GCS verbal subscore is 5. GCS motor subscore is 6.     Sensory: No sensory deficit.  Psychiatric:        Speech: Speech normal.        Behavior: Behavior normal.        Thought Content: Thought content normal.        Judgment: Judgment normal.     No results found for this or any previous visit (from the past 48 hour(s)).  Assessment & Plan  1. Shortness of breath - EKG 12-Lead - POCT Influenza A/B - DG Chest 2 View; Future  2. Other fatigue - EKG 12-Lead - POCT Influenza A/B  3. Productive cough - POCT Influenza A/B - guaiFENesin (MUCINEX) 600 MG 12 hr tablet; Take 1 tablet (600 mg total) by mouth 2 (two) times daily.  Dispense: 30 tablet; Refill: 0  4. Generalized body aches - POCT Influenza A/B  5. Right-sided chest pain - EKG 12-Lead - DG Chest 2 View; Future  6. Influenza A Patient positive for influenza A, fatigue and fevers and chills and weakness have improved.  Patient notes mild exertional shortness of breath starting today.  Patient has some right sided chest pain tender to palpation.  EKG normal sinus rhythm with no ST changes.  Discussed ER precautions in detail.  Will get chest x-ray rule out pneumonia.  Hydration and OTC management discussed.  We will follow-up in 1 week- labs if needed.    -Red flags and when to present for emergency care or RTC including fever >101.66F,  chest pain, shortness of breath, new/worsening/un-resolving symptoms,  reviewed with patient at time of visit. Follow up and care instructions discussed and provided in AVS. Face-to-face time with patient was more than 25 minutes, >50% time spent counseling and coordination of care

## 2019-01-17 NOTE — Patient Instructions (Addendum)
Continue to rest, drink plenty of fluids- at least 64 ounces of water a day.  Can take acetaminophen 500-650mg  every 8 hours as needed for pain or fevers Use heat for 20 minutes 4-5 times a day to right chest, use protection for your skin.  Make sure you are taking good deep breaths Use humidifier and guaifenesin 600mg  every 12 hours to loosen up secretions You can use allegra or zyrtec to help dry out your nose instead of Claritin   - If your shortness of breath, chest pain worsens or you develop severe weakness please call 911

## 2019-01-17 NOTE — Telephone Encounter (Signed)
Pt c/o weakness, fatigue, mild SOB, productive cough. SOB  Began today. Pt is coughing up thick white phlegm. Afebrile. Pt c/o 'subtle" ache to right upper chest and to right shoulder blade. Denies pain when taking a deep breath. Pt self diagnosed herself with the flu.  Care advice given and pt given an appt this morning with Suezanne Cheshire NP. Pt verbalized understanding. No openings this morning with Dr Sanda Klein.  Reason for Disposition . [1] MODERATE weakness (i.e., interferes with work, school, normal activities) AND [2] persists > 3 days . [1] MILD difficulty breathing (e.g., minimal/no SOB at rest, SOB with walking, pulse <100) AND [2] NEW-onset or WORSE than normal  Answer Assessment - Initial Assessment Questions 1. DESCRIPTION: "Describe how you are feeling."     Weakness generalized no endurance  2. SEVERITY: "How bad is it?"  "Can you stand and walk?"   - MILD - Feels weak or tired, but does not interfere with work, school or normal activities   - [place] to stand and walk; weakness interferes with work, school, or normal activities   - SEVERE - Unable to stand or walk     moderate 3. ONSET:  "When did the weakness begin?"     Since she got sick since last Friday 4. CAUSE: "What do you think is causing the weakness?"     Flu like symptoms 5. MEDICINES: "Have you recently started a new medicine or had a change in the amount of a medicine?"     no 6. OTHER SYMPTOMS: "Do you have any other symptoms?" (e.g., chest pain, fever, cough, SOB, vomiting, diarrhea, bleeding, other areas of pain)     "subtle "pain to upper chest pain Dull ache, SOB, afebrile since Monday 7. PREGNANCY: "Is there any chance you are pregnant?" "When was your last menstrual period?"     n/a  Answer Assessment - Initial Assessment Questions 1. RESPIRATORY STATUS: "Describe your breathing?" (e.g., wheezing, shortness of breath, unable to speak, severe coughing)      SOB, tightness upper chest Productive  cough thick White . ONSET: "When did this breathing problem begin?"      Today 3. PATTERN "Does the difficult breathing come and go, or has it been constant since it started?"      Comes and goes 4. SEVERITY: "How bad is your breathing?" (e.g., mild, moderate, severe)    - MILD: No SOB at rest, mild SOB with walking, speaks normally in sentences, can lay down, no retractions, pulse < 100.    - MODERATE: SOB at rest, SOB with minimal exertion and prefers to sit, cannot lie down flat, speaks in phrases, mild retractions, audible wheezing, pulse 100-120.    - SEVERE: Very SOB at rest, speaks in single words, struggling to breathe, sitting hunched forward, retractions, pulse > 120     mild 5. RECURRENT SYMPTOM: "Have you had difficulty breathing before?" If so, ask: "When was the last time?" and "What happened that time?"      no 6. CARDIAC HISTORY: "Do you have any history of heart disease?" (e.g., heart attack, angina, bypass surgery, angioplasty)      no 7. LUNG HISTORY: "Do you have any history of lung disease?"  (e.g., pulmonary embolus, asthma, emphysema)     no 8. CAUSE: "What do you think is causing the breathing problem?"      Recovering from illness 9. OTHER SYMPTOMS: "Do you have any other symptoms? (e.g., dizziness, runny nose, cough, chest pain, fever)  Cough, right upper chest pain, inside of shoulder blade on the right 10. PREGNANCY: "Is there any chance you are pregnant?" "When was your last menstrual period?"       n/a 11. TRAVEL: "Have you traveled out of the country in the last month?" (e.g., travel history, exposures)       no  Protocols used: WEAKNESS (GENERALIZED) AND FATIGUE-A-AH, BREATHING DIFFICULTY-A-AH

## 2019-01-17 NOTE — Telephone Encounter (Signed)
Please review prior to appt today.

## 2019-01-19 ENCOUNTER — Telehealth: Payer: Self-pay

## 2019-01-19 NOTE — Telephone Encounter (Signed)
Copied from [place] [id number]. Topic: General - Other >> Jan 18, 2019  1:35 PM Marin Olp L wrote: Reason for CRM: Calling for x-ray results  Please sign off on results

## 2019-01-22 ENCOUNTER — Other Ambulatory Visit: Payer: Self-pay | Admitting: Nurse Practitioner

## 2019-01-22 ENCOUNTER — Telehealth: Payer: Self-pay | Admitting: Nurse Practitioner

## 2019-01-22 MED ORDER — AZITHROMYCIN 250 MG PO TABS: ORAL_TABLET | ORAL | 0 refills | Status: DC

## 2019-01-22 NOTE — Telephone Encounter (Signed)
Called patient states still fatigued increasing activity slowly. Has mild shortness of breath, has mild improvement overall.

## 2019-01-26 ENCOUNTER — Encounter: Payer: Self-pay | Admitting: Family Medicine

## 2019-01-26 ENCOUNTER — Other Ambulatory Visit: Payer: Self-pay

## 2019-01-26 ENCOUNTER — Ambulatory Visit (INDEPENDENT_AMBULATORY_CARE_PROVIDER_SITE_OTHER): Payer: Medicare Other | Admitting: Family Medicine

## 2019-01-26 VITALS — BP 160/76 | HR 95 | Temp 98.0°F | Resp 16 | Ht 59.0 in | Wt 99.3 lb

## 2019-01-26 DIAGNOSIS — K30 Functional dyspepsia: Secondary | ICD-10-CM | POA: Diagnosis not present

## 2019-01-26 DIAGNOSIS — R1011 Right upper quadrant pain: Secondary | ICD-10-CM | POA: Diagnosis not present

## 2019-01-26 DIAGNOSIS — Z95828 Presence of other vascular implants and grafts: Secondary | ICD-10-CM | POA: Diagnosis not present

## 2019-01-26 DIAGNOSIS — R531 Weakness: Secondary | ICD-10-CM

## 2019-01-26 NOTE — Progress Notes (Signed)
Name: [name]   MRN: [id number]    DOB: Jan 05, 1927   Date:01/26/2019       Progress Note  Subjective  Chief Complaint  Chief Complaint  Patient presents with  . Fatigue  . Abdominal Pain    has discomfort, tightness and aching in the mid region of her abdomen.  . Chest Discomfort    RUQ    HPI  Weakness/RUQ pain/SOB: she is a very poor historian. She came in alone, in no distress, not sure of her cognitive ability , however it was really difficult to get a clear picture of timing and progression of her symptoms. After multiple questions, it seems like she was walking with a friend a couple of weeks ago and they mentioned that she seemed short of breath, since than she noticed that she has to take shallow breaths to not cause worsening of RUQ pain ( that seems to be chronic - since 2018 after celiac artery stent was placed). She has occasional lower extremity edema, but intermittent and also chronic ,no cough, wheezing or orthopnea. She was seen by Suezanne Cheshire last week and given Z-pack and CXR was negative but she is not feeling better. Before the end of the visit she states that she had two days of loose and frequent stools last week and fever ( not sure of value), no blood or mucus. She is s/p cholecystectomy. She has a history of GERD but has not been taking medication. She denies heartburn, but has indigestion and pain goes from RUQ to LUQ at times - also  chronic but may be a little worse recently. She denies urinary symptoms     Patient Active Problem List   Diagnosis Date Noted  . Chronic pain in right shoulder 12/06/2018  . Primary osteoarthritis of both hands 06/05/2018  . Pancreatic cyst 04/10/2018  . Myalgia due to statin 04/10/2018  . Raynaud's phenomenon without gangrene 04/04/2018  . Weakness 03/14/2018  . Varicose veins of leg with pain, right 09/13/2017  . History of lumbar laminectomy for spinal cord decompression 07/19/2017  . DNAR (do not attempt  resuscitation) 04/19/2017  . Compression fracture of L4 lumbar vertebra 12/10/2016  . Celiac artery stenosis ([place]) 12/06/2016  . Abdominal pain 04/09/2016  . Abdominal bloating 04/09/2016  . Ovarian cyst 03/09/2016  . Endometrial thickening on ultra sound 03/09/2016  . Pelvic pain in female 02/29/2016  . Preventative health care 01/03/2016  . Hyperglycemia 12/19/2015  . Subjective change in urination 12/19/2015  . Compression fracture of thoracic spine, non-traumatic ([place]) 08/19/2015  . Chronic thoracic spine pain 08/15/2015  . Low back pain 08/15/2015  . Compression fracture of L2 lumbar vertebra ([place]) 08/15/2015  . Hyperlipidemia   . Osteoporosis   . Rheumatoid arthritis ([place])   . Ovarian lump   . Atherosclerosis of abdominal aorta ([place])   . Lumbar spinal stenosis   . Chronic kidney disease, stage III (moderate) ([facility])   . CAD (coronary artery disease) 06/24/2014  . History of adenomatous polyp of colon 06/14/2014  . Pulmonary nodule seen on imaging study 07/13/2012  . Irritable bowel syndrome 02/16/2012    Past Surgical History:  Procedure Laterality Date  . BREAST SURGERY Left 1965   Benign biopsy  . CHOLECYSTECTOMY  2005   [place]  . COLONOSCOPY  2008   Dr. Donnella Sham  . EXCISION OF BREAST BIOPSY Left 1965   benign  . EYE SURGERY Bilateral    Cataract Extraction with IOL  . KYPHOPLASTY N/A 12/17/2016  Procedure: KYPHOPLASTY;  Surgeon: Hessie Knows, MD;  Location: [facility];  Service: Orthopedics;  Laterality: N/A;  l4  . LUMBAR LAMINECTOMY  05/09/2017   L2-L5  . VISCERAL ANGIOGRAPHY N/A 08/15/2017   Procedure: VISCERAL ANGIOGRAPHY;  Surgeon: Algernon Huxley, MD;  Location: [place] [facility];  Service: Cardiovascular;  Laterality: N/A;  . VISCERAL ANGIOGRAPHY N/A 10/05/2017   Procedure: VISCERAL ANGIOGRAPHY;  Surgeon: Algernon Huxley, MD;  Location: [place] [facility];  Service: Cardiovascular;  Laterality: N/A;  . VISCERAL ARTERY INTERVENTION N/A 08/15/2017    Procedure: VISCERAL ARTERY INTERVENTION;  Surgeon: Algernon Huxley, MD;  Location: [place] [facility];  Service: Cardiovascular;  Laterality: N/A;    Family History  Problem Relation Age of Onset  . Heart disease Mother   . Hypertension Father   . Heart disease Brother 42       heart attack  . Cancer Brother        bladder  . Diabetes Sister   . Alcohol abuse Sister   . Kidney disease Sister   . Diabetes Brother   . Stroke Brother   . Diabetes Maternal Grandmother   . Heart disease Maternal Grandmother   . Cancer Brother        lung    Social History   Socioeconomic History  . Marital status: Widowed    Spouse name: Gwyndolyn Saxon  . Number of children: 3  . Years of education: some college  . Highest education level: 12th grade  Occupational History    Employer: RETIRED  Social Needs  . Financial resource strain: Not hard at all  . Food insecurity:    Worry: Never true    Inability: Never true  . Transportation needs:    Medical: No    Non-medical: No  Tobacco Use  . Smoking status: Never Smoker  . Smokeless tobacco: Never Used  . Tobacco comment: smoking cessation materials not required  Substance and Sexual Activity  . Alcohol use: No    Alcohol/week: 0.0 standard drinks  . Drug use: No  . Sexual activity: Not Currently  Lifestyle  . Physical activity:    Days per week: 2 days    Minutes per session: 60 min  . Stress: Not at all  Relationships  . Social connections:    Talks on phone: More than three times a week    Gets together: Three times a week    Attends religious service: More than 4 times per year    Active member of club or organization: Yes    Attends meetings of clubs or organizations: More than 4 times per year    Relationship status: Widowed  . Intimate partner violence:    Fear of current or ex partner: No    Emotionally abused: No    Physically abused: No    Forced sexual activity: No  Other Topics Concern  . Not on file  Social History  Narrative  . Not on file     Current Outpatient Medications:  .  acetaminophen (TYLENOL) 500 MG tablet, Take 500 mg by mouth every 6 (six) hours as needed for mild pain., Disp: , Rfl:  .  Ascorbic Acid (VITAMIN C CR) 500 MG CPCR, Take 500 mg by mouth daily., Disp: , Rfl:  .  aspirin EC 81 MG tablet, Take by mouth., Disp: , Rfl:  .  atorvastatin (LIPITOR) 10 MG tablet, TAKE 1 TABLET BY MOUTH IN THE EVENING, Disp: 30 tablet, Rfl: 6 .  azithromycin (ZITHROMAX) 250 MG tablet, 2 tablets today, and 1 tablet for the next 4 days., Disp: 6 tablet, Rfl: 0 .  Brewers Yeast 487.5 MG TABS, Take 1 tablet by mouth daily. , Disp: , Rfl:  .  Calcium Carbonate-Vitamin D 600-200 MG-UNIT CAPS, Take by mouth., Disp: , Rfl:  .  Cholecalciferol (VITAMIN D3) 3000 UNITS TABS, Take 3,000 Units by mouth 2 (two) times daily. , Disp: , Rfl:  .  CINNAMON PO, Take 1,000 mg by mouth daily. , Disp: , Rfl:  .  clotrimazole-betamethasone (LOTRISONE) cream, continuously as needed., Disp: , Rfl:  .  co-enzyme Q-10 30 MG capsule, Take 30 mg by mouth daily. , Disp: , Rfl:  .  fluticasone (FLONASE) 50 MCG/ACT nasal spray, Place 1 spray into both nostrils daily., Disp: , Rfl:  .  Ginkgo Biloba (GNP GINGKO BILOBA EXTRACT PO), Take 1 tablet by mouth daily., Disp: , Rfl:  .  Glucosamine Sulfate 500 MG CAPS, Take 500 mg by mouth daily., Disp: , Rfl:  .  guaiFENesin (MUCINEX) 600 MG 12 hr tablet, Take 1 tablet (600 mg total) by mouth 2 (two) times daily., Disp: 30 tablet, Rfl: 0 .  ketotifen (ZADITOR) 0.025 % ophthalmic solution, Place 1 drop into both eyes 2 (two) times daily. , Disp: , Rfl:  .  Lactobacillus (PROBIOTIC ACIDOPHILUS PO), Take 1 capsule by mouth daily. , Disp: , Rfl:  .  loratadine (CLARITIN) 10 MG tablet, Take 10 mg by mouth daily as needed for allergies., Disp: , Rfl:  .  Magnesium 400 MG CAPS, Take 400 mg by mouth daily., Disp: , Rfl:  .  Multiple Vitamin (MULTIVITAMIN) capsule, Take 1 capsule by mouth daily., Disp: ,  Rfl:  .  neomycin-bacitracin-polymyxin (NEOSPORIN) [phone] ointment, Apply 1 application topically daily as needed (itching). , Disp: , Rfl:  .  Omega-3 Fatty Acids (OMEGA 3 PO), Take 520 mg by mouth daily. , Disp: , Rfl:  .  Pramoxine-HC (HYDROCORTISONE ACE-PRAMOXINE) 2.5-1 % CREA, Apply topically as needed. , Disp: , Rfl:  .  sodium chloride (OCEAN) 0.65 % SOLN nasal spray, Place 1 spray into both nostrils every 4 (four) hours as needed for congestion. , Disp: , Rfl:  .  traMADol (ULTRAM) 50 MG tablet, Take by mouth., Disp: , Rfl:  .  Turmeric 450 MG CAPS, Take 450 Units by mouth., Disp: , Rfl:  .  vitamin B-12 (CYANOCOBALAMIN) 1000 MCG tablet, Take 1,000 mcg by mouth daily., Disp: , Rfl:   Allergies  Allergen Reactions  . Ibandronic Acid Other (See Comments)    Achy all over. Flu like S/S  . Other Other (See Comments)    Achy all over. Flu like S/S Dysphagia  . Actonel [Risedronate] Other (See Comments)    Dysphagia  . Raloxifene Other (See Comments)    Mood swings  . Versed [Midazolam] Other (See Comments)    Difficult waking up and memory loss  . Doxycycline Rash  . Prednisone Rash    I personally reviewed active problem list, medication list, allergies, family history, social history with the patient/caregiver today.   ROS  Constitutional: Negative for fever or weight change.  Respiratory: Negative for cough , positive for  shortness of breath.   Cardiovascular: Negative for chest pain or palpitations.  Gastrointestinal: positive  For right upper quadrant  abdominal pain, no bowel changes.  Musculoskeletal: Negative for gait problem or joint swelling.  Skin: Negative for rash.  Neurological: Negative for dizziness or headache.  No other  specific complaints in a complete review of systems (except as listed in HPI above).  Objective  Vitals:   01/26/19 1442  BP: (!) 160/76  Pulse: 95  Resp: 16  Temp: 98 F (36.7 C)  TempSrc: Oral  SpO2: 97%  Weight: 99 lb 4.8  oz (45 kg)  Height: 4\' 11"  (1.499 m)    Body mass index is 20.06 kg/m.  Physical Exam  Constitutional: Patient is petitet No distress.  HEENT: head atraumatic, normocephalic, pupils equal and reactive to light, neck supple, throat within normal limits Cardiovascular: Normal rate, regular rhythm and normal heart sounds.  No murmur heard. No BLE edema. Pulmonary/Chest: Effort normal and breath sounds normal. No respiratory distress. Abdominal: Soft.  There is abdominal discomfort, worse on epigastric area .  Psychiatric: Patient has a normal mood and affect. behavior is normal.   Recent Results (from the past 2160 hour(s))  Hemoglobin A1c     Status: Abnormal   Collection Time: 01/01/19 12:38 PM  Result Value Ref Range   Hgb A1c MFr Bld 6.2 (H) <5.7 % of total Hgb    Comment: For someone without known diabetes, a hemoglobin  A1c value between 5.7% and 6.4% is consistent with prediabetes and should be confirmed with a  follow-up test. . For someone with known diabetes, a value <7% indicates that their diabetes is well controlled. A1c targets should be individualized based on duration of diabetes, age, comorbid conditions, and other considerations. . This assay result is consistent with an increased risk of diabetes. . Currently, no consensus exists regarding use of hemoglobin A1c for diagnosis of diabetes for children. .    Mean Plasma Glucose 131 (calc)   eAG (mmol/L) 7.3 (calc)  Lipid panel     Status: None   Collection Time: 01/01/19 12:38 PM  Result Value Ref Range   Cholesterol 158 <200 mg/dL   HDL 77 > OR = 50 mg/dL   Triglycerides 96 <150 mg/dL   LDL Cholesterol (Calc) 63 mg/dL (calc)    Comment: Reference range: <100 . Desirable range <100 mg/dL for primary prevention;   <70 mg/dL for patients with CHD or diabetic patients  with > or = 2 CHD risk factors. [name] LDL-C is now calculated using the Martin-Hopkins  calculation, which is a validated novel method  providing  better accuracy than the Friedewald equation in the  estimation of LDL-C.  Cresenciano Genre et al. Annamaria Helling. 2952;841(32): 2061-2068  (http://education.[facility].com/faq/FAQ164)    Total CHOL/HDL Ratio 2.1 <5.0 (calc)   Non-HDL Cholesterol (Calc) 81 <130 mg/dL (calc)    Comment: For patients with diabetes plus 1 major ASCVD risk  factor, treating to a non-HDL-C goal of <100 mg/dL  (LDL-C of <70 mg/dL) is considered a therapeutic  option.   COMPLETE METABOLIC PANEL WITH GFR     Status: Abnormal   Collection Time: 01/01/19 12:38 PM  Result Value Ref Range   Glucose, Bld 84 65 - 99 mg/dL    Comment: .            Fasting reference interval .    BUN 14 7 - 25 mg/dL   Creat 0.56 (L) 0.60 - 0.88 mg/dL    Comment: For patients >55 years of age, the reference limit for Creatinine is approximately 13% higher for people identified as African-American. .    GFR, Est Non African American 83 > OR = 60 mL/min/1.32m2   GFR, Est African American 96 > OR = 60 mL/min/1.71m2   BUN/Creatinine Ratio 25 (  H) 6 - 22 (calc)   Sodium 140 135 - 146 mmol/L   Potassium 4.5 3.5 - 5.3 mmol/L   Chloride 104 98 - 110 mmol/L   CO2 29 20 - 32 mmol/L   Calcium 9.1 8.6 - 10.4 mg/dL   Total Protein 6.7 6.1 - 8.1 g/dL   Albumin 4.2 3.6 - 5.1 g/dL   Globulin 2.5 1.9 - 3.7 g/dL (calc)   AG Ratio 1.7 1.0 - 2.5 (calc)   Total Bilirubin 0.3 0.2 - 1.2 mg/dL   Alkaline phosphatase (APISO) 55 37 - 153 U/L   AST 38 (H) 10 - 35 U/L   ALT 24 6 - 29 U/L  CBC with Differential/Platelet     Status: None   Collection Time: 01/01/19 12:38 PM  Result Value Ref Range   WBC 4.7 3.8 - 10.8 Thousand/uL   RBC 4.85 3.80 - 5.10 Million/uL   Hemoglobin 14.6 11.7 - 15.5 g/dL   HCT 42.8 35.0 - 45.0 %   MCV 88.2 80.0 - 100.0 fL   MCH 30.1 27.0 - 33.0 pg   MCHC 34.1 32.0 - 36.0 g/dL   RDW 12.3 11.0 - 15.0 %   Platelets 232 140 - 400 Thousand/uL   MPV 11.3 7.5 - 12.5 fL   Neutro Abs 2,477 1,500 - 7,800 cells/uL   Lymphs  Abs 1,664 850 - 3,900 cells/uL   Absolute Monocytes 428 200 - 950 cells/uL   Eosinophils Absolute 71 15 - 500 cells/uL   Basophils Absolute 61 0 - 200 cells/uL   Neutrophils Relative % 52.7 %   Total Lymphocyte 35.4 %   Monocytes Relative 9.1 %   Eosinophils Relative 1.5 %   Basophils Relative 1.3 %  Sjogren's syndrome antibods(ssa + ssb)     Status: None   Collection Time: 01/01/19 12:38 PM  Result Value Ref Range   SSA ([place]) (ENA) Antibody, IgG <1.0 NEG <1.0 NEG AI   SSB ([place]) (ENA) Antibody, IgG <1.0 NEG <1.0 NEG AI  POCT Influenza A/B     Status: Abnormal   Collection Time: 01/17/19 12:10 PM  Result Value Ref Range   Influenza A, POC Positive (A) Negative   Influenza B, POC Negative Negative      PHQ2/9: Depression screen [facility] 01/17/2019 11/03/2018 04/28/2018 04/10/2018 03/14/2018  Decreased Interest 0 0 0 0 0  Down, Depressed, Hopeless 0 0 0 0 0  PHQ - 2 Score 0 0 0 0 0  Altered sleeping - 0 0 - -  Tired, decreased energy - 0 0 - -  Change in appetite - 0 0 - -  Feeling bad or failure about yourself  - 0 0 - -  Trouble concentrating - 0 0 - -  Moving slowly or fidgety/restless - 0 0 - -  Suicidal thoughts - 0 0 - -  PHQ-9 Score - 0 0 - -  Difficult doing work/chores - Not difficult at all Not difficult at all - -    Fall Risk: Fall Risk  01/17/2019 11/03/2018 04/28/2018 04/10/2018 03/14/2018  Falls in the past year? 0 0 Yes Yes No  Number falls in past yr: 0 - 1 1 -  Injury with Fall? 0 - No No -  Risk for fall due to : - - Impaired vision - -  Risk for fall due to: Comment - - wears contacts - -  Follow up - - Falls evaluation completed;Education provided;Falls prevention discussed - -     Assessment & Plan  1. RUQ pain  - CBC with Differential/Platelet - COMPLETE METABOLIC PANEL WITH GFR - Lipase  2. Indigestion  - CBC with Differential/Platelet - COMPLETE METABOLIC PANEL WITH GFR - Lipase  3. Weakness  - CBC with Differential/Platelet - COMPLETE  METABOLIC PANEL WITH GFR - Lipase  4. History of intravascular stent placement  - Ambulatory referral to Vascular Surgery

## 2019-01-27 ENCOUNTER — Other Ambulatory Visit: Payer: Self-pay | Admitting: Family Medicine

## 2019-01-27 DIAGNOSIS — R1013 Epigastric pain: Secondary | ICD-10-CM

## 2019-01-27 LAB — COMPLETE METABOLIC PANEL WITH GFR
AG Ratio: 1.8 (calc) (ref 1.0–2.5)
ALT: 23 U/L (ref 6–29)
AST: 33 U/L (ref 10–35)
Albumin: 4.3 g/dL (ref 3.6–5.1)
Alkaline phosphatase (APISO): 51 U/L (ref 37–153)
BUN: 19 mg/dL (ref 7–25)
CO2: 29 mmol/L (ref 20–32)
Calcium: 9.7 mg/dL (ref 8.6–10.4)
Chloride: 102 mmol/L (ref 98–110)
Creat: 0.62 mg/dL (ref 0.60–0.88)
GFR, Est African American: 93 mL/min/{1.73_m2} (ref >=60)
GFR, Est Non African American: 80 mL/min/{1.73_m2} (ref >=60)
Globulin: 2.4 g/dL (calc) (ref 1.9–3.7)
Glucose, Bld: 93 mg/dL (ref 65–99)
Potassium: 4.4 mmol/L (ref 3.5–5.3)
Sodium: 138 mmol/L (ref 135–146)
Total Bilirubin: 0.3 mg/dL (ref 0.2–1.2)
Total Protein: 6.7 g/dL (ref 6.1–8.1)

## 2019-01-27 LAB — LIPASE: Lipase: 87 U/L — ABNORMAL HIGH (ref 7–60)

## 2019-01-27 LAB — CBC WITH DIFFERENTIAL/PLATELET
Absolute Monocytes: 705 cells/uL (ref 200–950)
Basophils Absolute: 38 cells/uL (ref 0–200)
Basophils Relative: 0.5 %
Eosinophils Absolute: 53 cells/uL (ref 15–500)
Eosinophils Relative: 0.7 %
HCT: 40.3 % (ref 35.0–45.0)
Hemoglobin: 13.4 g/dL (ref 11.7–15.5)
Lymphs Abs: 1673 cells/uL (ref 850–3900)
MCH: 29.3 pg (ref 27.0–33.0)
MCHC: 33.3 g/dL (ref 32.0–36.0)
MCV: 88.2 fL (ref 80.0–100.0)
MPV: 11.5 fL (ref 7.5–12.5)
Monocytes Relative: 9.4 %
Neutro Abs: 5033 cells/uL (ref 1500–7800)
Neutrophils Relative %: 67.1 %
Platelets: 254 10*3/uL (ref 140–400)
RBC: 4.57 10*6/uL (ref 3.80–5.10)
RDW: 12.5 % (ref 11.0–15.0)
Total Lymphocyte: 22.3 %
WBC: 7.5 10*3/uL (ref 3.8–10.8)

## 2019-01-27 MED ORDER — OMEPRAZOLE 40 MG PO CPDR: 40.0000 mg | DELAYED_RELEASE_CAPSULE | ORAL | 0 refills | Status: DC

## 2019-01-27 MED ORDER — FAMOTIDINE 40 MG PO TABS: 40.0000 mg | ORAL_TABLET | Freq: Every day | ORAL | 0 refills | Status: DC

## 2019-01-30 DIAGNOSIS — M542 Cervicalgia: Secondary | ICD-10-CM | POA: Diagnosis not present

## 2019-01-30 DIAGNOSIS — R768 Other specified abnormal immunological findings in serum: Secondary | ICD-10-CM | POA: Diagnosis not present

## 2019-03-14 ENCOUNTER — Other Ambulatory Visit: Payer: Self-pay

## 2019-03-14 ENCOUNTER — Ambulatory Visit (INDEPENDENT_AMBULATORY_CARE_PROVIDER_SITE_OTHER): Payer: Medicare Other | Admitting: Nurse Practitioner

## 2019-03-14 ENCOUNTER — Encounter: Payer: Self-pay | Admitting: Nurse Practitioner

## 2019-03-14 VITALS — BP 118/82 | HR 78 | Temp 98.1°F | Resp 14 | Ht 60.0 in | Wt 101.5 lb

## 2019-03-14 DIAGNOSIS — T148XXA Other injury of unspecified body region, initial encounter: Secondary | ICD-10-CM

## 2019-03-14 DIAGNOSIS — Z23 Encounter for immunization: Secondary | ICD-10-CM

## 2019-03-14 DIAGNOSIS — B360 Pityriasis versicolor: Secondary | ICD-10-CM | POA: Diagnosis not present

## 2019-03-14 DIAGNOSIS — S61412A Laceration without foreign body of left hand, initial encounter: Secondary | ICD-10-CM | POA: Diagnosis not present

## 2019-03-14 MED ORDER — CLOTRIMAZOLE-BETAMETHASONE 1-0.05 % EX CREA: TOPICAL_CREAM | Freq: Two times a day (BID) | CUTANEOUS | 1 refills | Status: DC

## 2019-03-14 MED ORDER — AMOXICILLIN-POT CLAVULANATE 875-125 MG PO TABS: 1.0000 | ORAL_TABLET | Freq: Two times a day (BID) | ORAL | 0 refills | Status: DC

## 2019-03-14 NOTE — Progress Notes (Signed)
Name: [name]   MRN: [id number]    DOB: 1927/08/03   Date:03/14/2019       Progress Note  Subjective  Chief Complaint  Chief Complaint  Patient presents with  . Animal Bite    left hand, with pain and swelling  . Medication Refill    HPI  Patients dog accidentally bit her hand when she was giving it a treat, it happened last week on saturday. Realized her TDAP is out of date and came for update. States initially her hand was swollen and red but has improved some. Endorses mild pain, states some pain with moving her thumb. States she cleaned it really good. Denies fevers or chills.    Patient states occasionally gets rash on her arms that is treated with Lotrisone would like it reordered if possible. States it typically comes in the summer, no tender or painful.   PHQ2/9: Depression screen [facility] 2/9 03/14/2019 01/17/2019 11/03/2018 04/28/2018 04/10/2018  Decreased Interest 0 0 0 0 0  Down, Depressed, Hopeless 0 0 0 0 0  PHQ - 2 Score 0 0 0 0 0  Altered sleeping 0 - 0 0 -  Tired, decreased energy 0 - 0 0 -  Change in appetite 0 - 0 0 -  Feeling bad or failure about yourself  0 - 0 0 -  Trouble concentrating 0 - 0 0 -  Moving slowly or fidgety/restless 0 - 0 0 -  Suicidal thoughts 0 - 0 0 -  PHQ-9 Score 0 - 0 0 -  Difficult doing work/chores Not difficult at all - Not difficult at all Not difficult at all -     PHQ reviewed. Negative  Patient Active Problem List   Diagnosis Date Noted  . Chronic pain in right shoulder 12/06/2018  . Primary osteoarthritis of both hands 06/05/2018  . Pancreatic cyst 04/10/2018  . Myalgia due to statin 04/10/2018  . Raynaud's phenomenon without gangrene 04/04/2018  . Weakness 03/14/2018  . Varicose veins of leg with pain, right 09/13/2017  . History of lumbar laminectomy for spinal cord decompression 07/19/2017  . DNAR (do not attempt resuscitation) 04/19/2017  . Compression fracture of L4 lumbar vertebra 12/10/2016  . Celiac artery  stenosis ([place]) 12/06/2016  . Abdominal pain 04/09/2016  . Abdominal bloating 04/09/2016  . Ovarian cyst 03/09/2016  . Endometrial thickening on ultra sound 03/09/2016  . Pelvic pain in female 02/29/2016  . Preventative health care 01/03/2016  . Hyperglycemia 12/19/2015  . Subjective change in urination 12/19/2015  . Compression fracture of thoracic spine, non-traumatic ([place]) 08/19/2015  . Chronic thoracic spine pain 08/15/2015  . Low back pain 08/15/2015  . Compression fracture of L2 lumbar vertebra ([place]) 08/15/2015  . Hyperlipidemia   . Osteoporosis   . Rheumatoid arthritis ([place])   . Ovarian lump   . Atherosclerosis of abdominal aorta ([place])   . Lumbar spinal stenosis   . Chronic kidney disease, stage III (moderate) (HCC)   . CAD (coronary artery disease) 06/24/2014  . History of adenomatous polyp of colon 06/14/2014  . Pulmonary nodule seen on imaging study 07/13/2012  . Irritable bowel syndrome 02/16/2012    Past Medical History:  Diagnosis Date  . Arthritis   . Atherosclerosis   . Atherosclerosis of abdominal aorta ([place])   . Chronic kidney disease    stage 2  . CKD (chronic kidney disease) stage 2, GFR 60-89 ml/min   . Collagen vascular disease ([place])   . Diverticulosis 2008  . GERD (  gastroesophageal reflux disease)   . Heart murmur   . Hyperlipidemia   . Lumbar spinal stenosis   . Osteoporosis   . Ovarian lump   . RA (rheumatoid arthritis) ([place])   . Raynaud's disease   . Rheumatoid arthritis ([place])   . Spinal stenosis     Past Surgical History:  Procedure Laterality Date  . BREAST SURGERY Left 1965   Benign biopsy  . CHOLECYSTECTOMY  2005   [facility]  . COLONOSCOPY  2008   Dr. Donnella Sham  . EXCISION OF BREAST BIOPSY Left 1965   benign  . EYE SURGERY Bilateral    Cataract Extraction with IOL  . KYPHOPLASTY N/A 12/17/2016   Procedure: KYPHOPLASTY;  Surgeon: Hessie Knows, MD;  Location: [facility] ORS;  Service: Orthopedics;  Laterality: N/A;  l4  . LUMBAR  LAMINECTOMY  05/09/2017   L2-L5  . VISCERAL ANGIOGRAPHY N/A 08/15/2017   Procedure: VISCERAL ANGIOGRAPHY;  Surgeon: Algernon Huxley, MD;  Location: [place] [facility];  Service: Cardiovascular;  Laterality: N/A;  . VISCERAL ANGIOGRAPHY N/A 10/05/2017   Procedure: VISCERAL ANGIOGRAPHY;  Surgeon: Algernon Huxley, MD;  Location: [place] [facility];  Service: Cardiovascular;  Laterality: N/A;  . VISCERAL ARTERY INTERVENTION N/A 08/15/2017   Procedure: VISCERAL ARTERY INTERVENTION;  Surgeon: Algernon Huxley, MD;  Location: [place] [facility];  Service: Cardiovascular;  Laterality: N/A;    Social History   Tobacco Use  . Smoking status: Never Smoker  . Smokeless tobacco: Never Used  . Tobacco comment: smoking cessation materials not required  Substance Use Topics  . Alcohol use: No    Alcohol/week: 0.0 standard drinks     Current Outpatient Medications:  .  acetaminophen (TYLENOL) 500 MG tablet, Take 500 mg by mouth every 6 (six) hours as needed for mild pain., Disp: , Rfl:  .  Ascorbic Acid (VITAMIN C CR) 500 MG CPCR, Take 500 mg by mouth daily., Disp: , Rfl:  .  aspirin EC 81 MG tablet, Take by mouth., Disp: , Rfl:  .  atorvastatin (LIPITOR) 10 MG tablet, TAKE 1 TABLET BY MOUTH IN THE EVENING, Disp: 30 tablet, Rfl: 6 .  Calcium Carbonate-Vitamin D 600-200 MG-UNIT CAPS, Take by mouth., Disp: , Rfl:  .  Cholecalciferol (VITAMIN D3) 3000 UNITS TABS, Take 3,000 Units by mouth 2 (two) times daily. , Disp: , Rfl:  .  CINNAMON PO, Take 1,000 mg by mouth daily. , Disp: , Rfl:  .  clotrimazole-betamethasone (LOTRISONE) cream, continuously as needed., Disp: , Rfl:  .  co-enzyme Q-10 30 MG capsule, Take 30 mg by mouth daily. , Disp: , Rfl:  .  fluticasone (FLONASE) 50 MCG/ACT nasal spray, Place 1 spray into both nostrils daily., Disp: , Rfl:  .  Ginkgo Biloba (GNP GINGKO BILOBA EXTRACT PO), Take 1 tablet by mouth daily., Disp: , Rfl:  .  Glucosamine Sulfate 500 MG CAPS, Take 500 mg by mouth daily.,  Disp: , Rfl:  .  guaiFENesin (MUCINEX) 600 MG 12 hr tablet, Take 1 tablet (600 mg total) by mouth 2 (two) times daily., Disp: 30 tablet, Rfl: 0 .  ketotifen (ZADITOR) 0.025 % ophthalmic solution, Place 1 drop into both eyes 2 (two) times daily. , Disp: , Rfl:  .  loratadine (CLARITIN) 10 MG tablet, Take 10 mg by mouth daily as needed for allergies., Disp: , Rfl:  .  Magnesium 400 MG CAPS, Take 400 mg by mouth daily., Disp: , Rfl:  .  Multiple Vitamin (MULTIVITAMIN) capsule, Take  1 capsule by mouth daily., Disp: , Rfl:  .  neomycin-bacitracin-polymyxin (NEOSPORIN) [phone] ointment, Apply 1 application topically daily as needed (itching). , Disp: , Rfl:  .  Omega-3 Fatty Acids (OMEGA 3 PO), Take 520 mg by mouth daily. , Disp: , Rfl:  .  Pramoxine-HC (HYDROCORTISONE ACE-PRAMOXINE) 2.5-1 % CREA, Apply topically as needed. , Disp: , Rfl:  .  sodium chloride (OCEAN) 0.65 % SOLN nasal spray, Place 1 spray into both nostrils every 4 (four) hours as needed for congestion. , Disp: , Rfl:  .  Turmeric 450 MG CAPS, Take 450 Units by mouth., Disp: , Rfl:  .  vitamin B-12 (CYANOCOBALAMIN) 1000 MCG tablet, Take 1,000 mcg by mouth daily., Disp: , Rfl:  .  famotidine (PEPCID) 40 MG tablet, Take 1 tablet (40 mg total) by mouth at bedtime. (Patient not taking: Reported on 03/14/2019), Disp: 30 tablet, Rfl: 0 .  Lactobacillus (PROBIOTIC ACIDOPHILUS PO), Take 1 capsule by mouth daily. , Disp: , Rfl:  .  omeprazole (PRILOSEC) 40 MG capsule, Take 1 capsule (40 mg total) by mouth every morning. (Patient not taking: Reported on 03/14/2019), Disp: 30 capsule, Rfl: 0 .  traMADol (ULTRAM) 50 MG tablet, Take by mouth., Disp: , Rfl:   Allergies  Allergen Reactions  . Ibandronic Acid Other (See Comments)    Achy all over. Flu like S/S  . Other Other (See Comments)    Achy all over. Flu like S/S Dysphagia  . Actonel [Risedronate] Other (See Comments)    Dysphagia  . Raloxifene Other (See Comments)    Mood swings  .  Versed [Midazolam] Other (See Comments)    Difficult waking up and memory loss  . Doxycycline Rash  . Prednisone Rash    ROS    No other specific complaints in a complete review of systems (except as listed in HPI above).  Objective  Vitals:   03/14/19 0937  BP: 118/82  Pulse: 78  Resp: 14  Temp: 98.1 F (36.7 C)  TempSrc: Oral  SpO2: 98%  Weight: 101 lb 8 oz (46 kg)  Height: 5' (1.524 m)     Body mass index is 19.82 kg/m.  Nursing Note and Vital Signs reviewed.  Physical Exam Constitutional:      Appearance: Normal appearance.  HENT:     Head: Normocephalic and atraumatic.  Cardiovascular:     Rate and Rhythm: Normal rate.     Pulses: Normal pulses.  Pulmonary:     Effort: Pulmonary effort is normal.  Musculoskeletal:     Left hand: She exhibits decreased range of motion (left thumb due to pan). Normal sensation noted. Normal strength noted.       Hands:  Skin:    General: Skin is warm and dry.     Coloration: Skin is not jaundiced.     Findings: No bruising.  Neurological:     General: No focal deficit present.     Mental Status: She is alert and oriented to person, place, and time.  Psychiatric:        Mood and Affect: Mood normal.        No results found for this or any previous visit (from the past 48 hour(s)).  Assessment & Plan  1. Laceration of left hand without foreign body, initial encounter - Tdap vaccine greater than or equal to 7yo IM - amoxicillin-clavulanate (AUGMENTIN) 875-125 MG tablet; Take 1 tablet by mouth 2 (two) times daily.  Dispense: 14 tablet; Refill: 0  2. Animal  bite - Tdap vaccine greater than or equal to 7yo IM - amoxicillin-clavulanate (AUGMENTIN) 875-125 MG tablet; Take 1 tablet by mouth 2 (two) times daily.  Dispense: 14 tablet; Refill: 0  3. Need for tetanus booster  - Tdap vaccine greater than or equal to 7yo IM  4. Tinea versicolor - clotrimazole-betamethasone (LOTRISONE) cream; Apply topically 2 (two)  times daily.  Dispense: 30 g; Refill: 1

## 2019-03-14 NOTE — Patient Instructions (Signed)
Please take your antibiotic as prescribed with food on your stomach to prevent nausea and upset stomach. Take the antibiotic for the entire course, even if your symptoms resolve before the course if completed. Using antibiotics inappropriately can make it harder to treat future infections. If you have any concerning side effects please stop the medication and let us know immediately. I do recommend taking a probiotic to replenish your good gut health anytime you take an antibiotic. You can get probiotics in types of yogurt like activia, or other food/drinks such as kimchi, kombucha , sauerkraut, or you can take an over the counter supplement.   Wound Care, Adult Taking care of your wound properly can help to prevent pain, infection, and scarring. It can also help your wound to heal more quickly. How to care for your wound Wound care   Follow instructions from your health care provider about how to take care of your wound. Make sure you: ? Wash your hands with soap and water before you change the bandage (dressing). If soap and water are not available, use hand sanitizer. ? Change your dressing as told by your health care provider. ? Leave stitches (sutures), skin glue, or adhesive strips in place. These skin closures may need to stay in place for 2 weeks or longer. If adhesive strip edges start to loosen and curl up, you may trim the loose edges. Do not remove adhesive strips completely unless your health care provider tells you to do that.  Check your wound area every day for signs of infection. Check for: ? Redness, swelling, or pain. ? Fluid or blood. ? Warmth. ? Pus or a bad smell.  Ask your health care provider if you should clean the wound with mild soap and water. Doing this may include: ? Using a clean towel to pat the wound dry after cleaning it. Do not rub or scrub the wound. ? Applying a cream or ointment. Do this only as told by your health care provider. ? Covering the incision  with a clean dressing.  Ask your health care provider when you can leave the wound uncovered.  Keep the dressing dry until your health care provider says it can be removed. Do not take baths, swim, use a hot tub, or do anything that would put the wound underwater until your health care provider approves. Ask your health care provider if you can take showers. You may only be allowed to take sponge baths. Medicines  If you were prescribed an antibiotic medicine, cream, or ointment, take or use the antibiotic as told by your health care provider. Do not stop taking or using the antibiotic even if your condition improves.  Take over-the-counter and prescription medicines only as told by your health care provider. If you were prescribed pain medicine, take it 30 or more minutes before you do any wound care or as told by your health care provider. General instructions  Return to your normal activities as told by your health care provider. Ask your health care provider what activities are safe.  Do not scratch or pick at the wound.  Do not use any products that contain nicotine or tobacco, such as cigarettes and e-cigarettes. These may delay wound healing. If you need help quitting, ask your health care provider.  Keep all follow-up visits as told by your health care provider. This is important.  Eat a diet that includes protein, vitamin A, vitamin C, and other nutrient-rich foods to help the wound heal. ? Foods  rich in protein include meat, dairy, beans, nuts, and other sources. ? Foods rich in vitamin A include carrots and dark green, leafy vegetables. ? Foods rich in vitamin C include citrus, tomatoes, and other fruits and vegetables. ? Nutrient-rich foods have protein, carbohydrates, fat, vitamins, or minerals. Eat a variety of healthy foods including vegetables, fruits, and whole grains. Contact a health care provider if:  You received a tetanus shot and you have swelling, severe pain,  redness, or bleeding at the injection site.  Your pain is not controlled with medicine.  You have redness, swelling, or pain around the wound.  You have fluid or blood coming from the wound.  Your wound feels warm to the touch.  You have pus or a bad smell coming from the wound.  You have a fever or chills.  You are nauseous or you vomit.  You are dizzy. Get help right away if:  You have a red streak going away from your wound.  The edges of the wound open up and separate.  Your wound is bleeding, and the bleeding does not stop with gentle pressure.  You have a rash.  You faint.  You have trouble breathing. Summary  Always wash your hands with soap and water before changing your bandage (dressing).  To help with healing, eat foods that are rich in protein, vitamin A, vitamin C, and other nutrients.  Check your wound every day for signs of infection. Contact your health care provider if you suspect that your wound is infected. This information is not intended to replace advice given to you by your health care provider. Make sure you discuss any questions you have with your health care provider. Document Released: 08/03/2008 Document Revised: 12/06/2017 Document Reviewed: 05/11/2016 Elsevier Interactive Patient Education  2019 [employer].

## 2019-04-03 DIAGNOSIS — Z961 Presence of intraocular lens: Secondary | ICD-10-CM | POA: Diagnosis not present

## 2019-05-03 ENCOUNTER — Ambulatory Visit: Payer: Medicare Other | Admitting: Family Medicine

## 2019-05-03 ENCOUNTER — Ambulatory Visit (INDEPENDENT_AMBULATORY_CARE_PROVIDER_SITE_OTHER): Payer: Medicare Other

## 2019-05-03 ENCOUNTER — Other Ambulatory Visit: Payer: Self-pay

## 2019-05-03 VITALS — BP 132/72 | HR 65 | Temp 97.7°F | Resp 16 | Ht 60.0 in | Wt 101.1 lb

## 2019-05-03 DIAGNOSIS — Z0000 Encounter for general adult medical examination without abnormal findings: Secondary | ICD-10-CM | POA: Diagnosis not present

## 2019-05-03 NOTE — Patient Instructions (Signed)
[name] , Thank you for taking time to come for your Medicare Wellness Visit. I appreciate your ongoing commitment to your health goals. Please review the following plan we discussed and let me know if I can assist you in the future.   Screening recommendations/referrals: Colonoscopy: done 05/08/2009.  Mammogram: done 12/26/14 Bone Density: done 01/06/17 Recommended yearly ophthalmology/optometry visit for glaucoma screening and checkup Recommended yearly dental visit for hygiene and checkup  Vaccinations: Influenza vaccine: done 09/12/18 Pneumococcal vaccine: done 12/13/14 Tdap vaccine: done 03/14/19 Shingles vaccine: Shingrix discussed. Please contact your pharmacy for coverage information.   Conditions/risks identified: Keep up the great work!  Next appointment: Please follow up in one year for your Medicare Annual Wellness visit.     Preventive Care 83 Years and Older, Female Preventive care refers to lifestyle choices and visits with your health care provider that can promote health and wellness. What does preventive care include?  A yearly physical exam. This is also called an annual well check.  Dental exams once or twice a year.  Routine eye exams. Ask your health care provider how often you should have your eyes checked.  Personal lifestyle choices, including:  Daily care of your teeth and gums.  Regular physical activity.  Eating a healthy diet.  Avoiding tobacco and drug use.  Limiting alcohol use.  Practicing safe sex.  Taking low-dose aspirin every day.  Taking vitamin and mineral supplements as recommended by your health care provider. What happens during an annual well check? The services and screenings done by your health care provider during your annual well check will depend on your age, overall health, lifestyle risk factors, and family history of disease. Counseling  Your health care provider may ask you questions about your:  Alcohol use.  Tobacco  use.  Drug use.  Emotional well-being.  Home and relationship well-being.  Sexual activity.  Eating habits.  History of falls.  Memory and ability to understand (cognition).  Work and work Statistician.  Reproductive health. Screening  You may have the following tests or measurements:  Height, weight, and BMI.  Blood pressure.  Lipid and cholesterol levels. These may be checked every 5 years, or more frequently if you are over 90 years old.  Skin check.  Lung cancer screening. You may have this screening every year starting at age 45 if you have a 30-pack-year history of smoking and currently smoke or have quit within the past 15 years.  Fecal occult blood test (FOBT) of the stool. You may have this test every year starting at age 83.  Flexible sigmoidoscopy or colonoscopy. You may have a sigmoidoscopy every 5 years or a colonoscopy every 10 years starting at age 12.  Hepatitis C blood test.  Hepatitis B blood test.  Sexually transmitted disease (STD) testing.  Diabetes screening. This is done by checking your blood sugar (glucose) after you have not eaten for a while (fasting). You may have this done every 1-3 years.  Bone density scan. This is done to screen for osteoporosis. You may have this done starting at age 23.  Mammogram. This may be done every 1-2 years. Talk to your health care provider about how often you should have regular mammograms. Talk with your health care provider about your test results, treatment options, and if necessary, the need for more tests. Vaccines  Your health care provider may recommend certain vaccines, such as:  Influenza vaccine. This is recommended every year.  Tetanus, diphtheria, and acellular pertussis (Tdap, Td)  vaccine. You may need a Td booster every 10 years.  Zoster vaccine. You may need this after age 82.  Pneumococcal 13-valent conjugate (PCV13) vaccine. One dose is recommended after age 83.  Pneumococcal  polysaccharide (PPSV23) vaccine. One dose is recommended after age 83. Talk to your health care provider about which screenings and vaccines you need and how often you need them. This information is not intended to replace advice given to you by your health care provider. Make sure you discuss any questions you have with your health care provider. Document Released: 11/21/2015 Document Revised: 07/14/2016 Document Reviewed: 08/26/2015 Elsevier Interactive Patient Education  2017 [place] Prevention in the Home Falls can cause injuries. They can happen to people of all ages. There are many things you can do to make your home safe and to help prevent falls. What can I do on the outside of my home?  Regularly fix the edges of walkways and driveways and fix any cracks.  Remove anything that might make you trip as you walk through a door, such as a raised step or threshold.  Trim any bushes or trees on the path to your home.  Use bright outdoor lighting.  Clear any walking paths of anything that might make someone trip, such as rocks or tools.  Regularly check to see if handrails are loose or broken. Make sure that both sides of any steps have handrails.  Any raised decks and porches should have guardrails on the edges.  Have any leaves, snow, or ice cleared regularly.  Use sand or salt on walking paths during winter.  Clean up any spills in your garage right away. This includes oil or grease spills. What can I do in the bathroom?  Use night lights.  Install grab bars by the toilet and in the tub and shower. Do not use towel bars as grab bars.  Use non-skid mats or decals in the tub or shower.  If you need to sit down in the shower, use a plastic, non-slip stool.  Keep the floor dry. Clean up any water that spills on the floor as soon as it happens.  Remove soap buildup in the tub or shower regularly.  Attach bath mats securely with double-sided non-slip rug tape.   Do not have throw rugs and other things on the floor that can make you trip. What can I do in the bedroom?  Use night lights.  Make sure that you have a light by your bed that is easy to reach.  Do not use any sheets or blankets that are too big for your bed. They should not hang down onto the floor.  Have a firm chair that has side arms. You can use this for support while you get dressed.  Do not have throw rugs and other things on the floor that can make you trip. What can I do in the kitchen?  Clean up any spills right away.  Avoid walking on wet floors.  Keep items that you use a lot in easy-to-reach places.  If you need to reach something above you, use a strong step stool that has a grab bar.  Keep electrical cords out of the way.  Do not use floor polish or wax that makes floors slippery. If you must use wax, use non-skid floor wax.  Do not have throw rugs and other things on the floor that can make you trip. What can I do with my stairs?  Do not leave  any items on the stairs.  Make sure that there are handrails on both sides of the stairs and use them. Fix handrails that are broken or loose. Make sure that handrails are as long as the stairways.  Check any carpeting to make sure that it is firmly attached to the stairs. Fix any carpet that is loose or worn.  Avoid having throw rugs at the top or bottom of the stairs. If you do have throw rugs, attach them to the floor with carpet tape.  Make sure that you have a light switch at the top of the stairs and the bottom of the stairs. If you do not have them, ask someone to add them for you. What else can I do to help prevent falls?  Wear shoes that:  Do not have high heels.  Have rubber bottoms.  Are comfortable and fit you well.  Are closed at the toe. Do not wear sandals.  If you use a stepladder:  Make sure that it is fully opened. Do not climb a closed stepladder.  Make sure that both sides of the  stepladder are locked into place.  Ask someone to hold it for you, if possible.  Clearly mark and make sure that you can see:  Any grab bars or handrails.  First and last steps.  Where the edge of each step is.  Use tools that help you move around (mobility aids) if they are needed. These include:  Canes.  Walkers.  Scooters.  Crutches.  Turn on the lights when you go into a dark area. Replace any light bulbs as soon as they burn out.  Set up your furniture so you have a clear path. Avoid moving your furniture around.  If any of your floors are uneven, fix them.  If there are any pets around you, be aware of where they are.  Review your medicines with your doctor. Some medicines can make you feel dizzy. This can increase your chance of falling. Ask your doctor what other things that you can do to help prevent falls. This information is not intended to replace advice given to you by your health care provider. Make sure you discuss any questions you have with your health care provider. Document Released: 08/21/2009 Document Revised: 04/01/2016 Document Reviewed: 11/29/2014 Elsevier Interactive Patient Education  2017 [employer].

## 2019-05-03 NOTE — Progress Notes (Signed)
Subjective:   [name] is a 85 y.o. female who presents for Medicare Annual (Subsequent) preventive examination.  Review of Systems:   Cardiac Risk Factors include: advanced age (>21men, >55 women);dyslipidemia     Objective:     Vitals: BP 132/72 (BP Location: Left Arm, Patient Position: Sitting, Cuff Size: Normal)   Pulse 65   Temp 97.7 F (36.5 C) (Oral)   Resp 16   Ht 5' (1.524 m)   Wt 101 lb 1.6 oz (45.9 kg)   SpO2 98%   BMI 19.74 kg/m   Body mass index is 19.74 kg/m.  Advanced Directives 05/03/2019 04/28/2018 10/05/2017 08/30/2017 08/02/2017 07/19/2017 04/19/2017  Does Patient Have a Medical Advance Directive? Yes Yes Yes Yes Yes No Yes  Type of Paramedic of [place];Living will [place];Living will [place];Living will [place];Living will [place];Living will - -  Does patient want to make changes to medical advance directive? - - No - Patient declined - - - -  Copy of [place] in Chart? Yes - validated most recent copy scanned in chart (See row information) Yes No - copy requested - - - -    Tobacco Social History   Tobacco Use  Smoking Status Never Smoker  Smokeless Tobacco Never Used  Tobacco Comment   smoking cessation materials not required     Counseling given: Not Answered Comment: smoking cessation materials not required   Clinical Intake:  Pre-visit preparation completed: Yes  Pain : No/denies pain     BMI - recorded: 19.74 Nutritional Status: BMI <19  Underweight Nutritional Risks: None Diabetes: No  How often do you need to have someone help you when you read instructions, pamphlets, or other written materials from your doctor or pharmacy?: 1 - Never  Interpreter Needed?: No  Information entered by :: Clemetine Marker LPN  Past Medical History:  Diagnosis Date  . Allergy   . Arthritis   . Atherosclerosis    . Atherosclerosis of abdominal aorta ([place])   . Chronic kidney disease    stage 2  . CKD (chronic kidney disease) stage 2, GFR 60-89 ml/min   . Collagen vascular disease ([place])   . Diverticulosis 2008  . GERD (gastroesophageal reflux disease)   . Heart murmur   . Hyperlipidemia   . Lumbar spinal stenosis   . Osteoporosis   . Ovarian lump   . RA (rheumatoid arthritis) ([place])   . Raynaud's disease   . Rheumatoid arthritis ([place])   . Spinal stenosis    Past Surgical History:  Procedure Laterality Date  . BREAST SURGERY Left 1965   Benign biopsy  . CHOLECYSTECTOMY  2005   [place]  . COLONOSCOPY  2008   Dr. Donnella Sham  . EXCISION OF BREAST BIOPSY Left 1965   benign  . EYE SURGERY Bilateral    Cataract Extraction with IOL  . KYPHOPLASTY N/A 12/17/2016   Procedure: KYPHOPLASTY;  Surgeon: Hessie Knows, MD;  Location: [facility] ORS;  Service: Orthopedics;  Laterality: N/A;  l4  . LUMBAR LAMINECTOMY  05/09/2017   L2-L5  . VISCERAL ANGIOGRAPHY N/A 08/15/2017   Procedure: VISCERAL ANGIOGRAPHY;  Surgeon: Algernon Huxley, MD;  Location: [place] [facility];  Service: Cardiovascular;  Laterality: N/A;  . VISCERAL ANGIOGRAPHY N/A 10/05/2017   Procedure: VISCERAL ANGIOGRAPHY;  Surgeon: Algernon Huxley, MD;  Location: [place] [facility];  Service: Cardiovascular;  Laterality: N/A;  . VISCERAL  ARTERY INTERVENTION N/A 08/15/2017   Procedure: VISCERAL ARTERY INTERVENTION;  Surgeon: Algernon Huxley, MD;  Location: [place] [facility];  Service: Cardiovascular;  Laterality: N/A;   Family History  Problem Relation Age of Onset  . Heart disease Mother   . Hypertension Father   . Heart disease Brother 19       heart attack  . Cancer Brother        bladder  . Diabetes Sister   . Alcohol abuse Sister   . Kidney disease Sister   . Diabetes Brother   . Stroke Brother   . Diabetes Maternal Grandmother   . Heart disease Maternal Grandmother   . Cancer Brother        lung   Social History   Socioeconomic  History  . Marital status: Widowed    Spouse name: Gwyndolyn Saxon  . Number of children: 3  . Years of education: some college  . Highest education level: 12th grade  Occupational History    Employer: RETIRED  Social Needs  . Financial resource strain: Not hard at all  . Food insecurity    Worry: Never true    Inability: Never true  . Transportation needs    Medical: No    Non-medical: No  Tobacco Use  . Smoking status: Never Smoker  . Smokeless tobacco: Never Used  . Tobacco comment: smoking cessation materials not required  Substance and Sexual Activity  . Alcohol use: No    Alcohol/week: 0.0 standard drinks  . Drug use: No  . Sexual activity: Not Currently  Lifestyle  . Physical activity    Days per week: 7 days    Minutes per session: 30 min  . Stress: Not at all  Relationships  . Social connections    Talks on phone: More than three times a week    Gets together: Three times a week    Attends religious service: More than 4 times per year    Active member of club or organization: Yes    Attends meetings of clubs or organizations: More than 4 times per year    Relationship status: Widowed  Other Topics Concern  . Not on file  Social History Narrative  . Not on file    Outpatient Encounter Medications as of 05/03/2019  Medication Sig  . acetaminophen (TYLENOL) 500 MG tablet Take 500 mg by mouth every 6 (six) hours as needed for mild pain.  . Ascorbic Acid (VITAMIN C CR) 500 MG CPCR Take 500 mg by mouth daily.  [name] aspirin EC 81 MG tablet Take by mouth.  [name] atorvastatin (LIPITOR) 10 MG tablet TAKE 1 TABLET BY MOUTH IN THE EVENING  . Calcium Carbonate-Vitamin D 600-200 MG-UNIT CAPS Take by mouth.  . Cholecalciferol (VITAMIN D3) 3000 UNITS TABS Take 3,000 Units by mouth 2 (two) times daily.   [name] CINNAMON PO Take 1,000 mg by mouth daily.   . clotrimazole-betamethasone (LOTRISONE) cream Apply topically 2 (two) times daily.  [name] co-enzyme Q-10 30 MG capsule Take 30 mg by mouth  daily.   . fluticasone (FLONASE) 50 MCG/ACT nasal spray Place 1 spray into both nostrils daily.  . Ginkgo Biloba (GNP GINGKO BILOBA EXTRACT PO) Take 1 tablet by mouth daily.  . Glucosamine Sulfate 500 MG CAPS Take 500 mg by mouth daily.  [name] ketotifen (ZADITOR) 0.025 % ophthalmic solution Place 1 drop into both eyes 2 (two) times daily.   . Lactobacillus (PROBIOTIC ACIDOPHILUS PO) Take 1 capsule by mouth daily.   [name]  loratadine (CLARITIN) 10 MG tablet Take 10 mg by mouth daily as needed for allergies.  . Magnesium 400 MG CAPS Take 400 mg by mouth daily.  . Multiple Vitamin (MULTIVITAMIN) capsule Take 1 capsule by mouth daily.  [name] neomycin-bacitracin-polymyxin (NEOSPORIN) [phone] ointment Apply 1 application topically daily as needed (itching).   . Omega-3 Fatty Acids (OMEGA 3 PO) Take 520 mg by mouth daily.   . Pramoxine-HC (HYDROCORTISONE ACE-PRAMOXINE) 2.5-1 % CREA Apply topically as needed.   . sodium chloride (OCEAN) 0.65 % SOLN nasal spray Place 1 spray into both nostrils every 4 (four) hours as needed for congestion.   . Turmeric 450 MG CAPS Take 450 Units by mouth.  . vitamin B-12 (CYANOCOBALAMIN) 1000 MCG tablet Take 1,000 mcg by mouth daily.  . [DISCONTINUED] amoxicillin-clavulanate (AUGMENTIN) 875-125 MG tablet Take 1 tablet by mouth 2 (two) times daily.  . [DISCONTINUED] famotidine (PEPCID) 40 MG tablet Take 1 tablet (40 mg total) by mouth at bedtime. (Patient not taking: Reported on 03/14/2019)  . [DISCONTINUED] guaiFENesin (MUCINEX) 600 MG 12 hr tablet Take 1 tablet (600 mg total) by mouth 2 (two) times daily.  . [DISCONTINUED] omeprazole (PRILOSEC) 40 MG capsule Take 1 capsule (40 mg total) by mouth every morning. (Patient not taking: Reported on 03/14/2019)  . [DISCONTINUED] traMADol (ULTRAM) 50 MG tablet Take by mouth.   No facility-administered encounter medications on file as of 05/03/2019.     Activities of Daily Living In your present state of health, do you have any difficulty  performing the following activities: 05/03/2019 03/14/2019  Hearing? Y N  Comment wears hearing aids -  Vision? N N  Difficulty concentrating or making decisions? N N  Walking or climbing stairs? N N  Dressing or bathing? N N  Doing errands, shopping? N N  Preparing Food and eating ? N -  Using the Toilet? N -  In the past six months, have you accidently leaked urine? Y -  Comment wears pads for protection -  Do you have problems with loss of bowel control? N -  Managing your Medications? N -  Managing your Finances? N -  Housekeeping or managing your Housekeeping? N -  Some recent data might be hidden    Patient Care Team: Lada, Satira Anis, MD as PCP - General (Family Medicine) Dingeldein, Remo Lipps, MD (Ophthalmology) Clyde Canterbury, MD as Referring Physician (Otolaryngology) Corey Skains, MD as Consulting Physician (Cardiology) Oneta Rack, MD as Consulting Physician (Dermatology) Lucky Cowboy Erskine Squibb, MD as Consulting Physician (Vascular Surgery)    Assessment:   This is a routine wellness examination for Jilleen.  Exercise Activities and Dietary recommendations Current Exercise Habits: Home exercise routine, Type of exercise: walking, Time (Minutes): 30, Frequency (Times/Week): 7, Weekly Exercise (Minutes/Week): 210, Intensity: Mild, Exercise limited by: None identified  Goals    . DIET - INCREASE WATER INTAKE     Recommend to drink at least 6-8 8oz glasses of water per day.       Fall Risk Fall Risk  05/03/2019 03/14/2019 01/17/2019 11/03/2018 04/28/2018  Falls in the past year? 0 0 0 0 Yes  Number falls in past yr: 0 0 0 - 1  Injury with Fall? 0 0 0 - No  Risk for fall due to : - - - - Impaired vision  Risk for fall due to: Comment - - - - wears contacts  Follow up Falls prevention discussed - - - Falls evaluation completed;Education provided;Falls prevention discussed   FALL RISK PREVENTION  PERTAINING TO THE HOME:  Any stairs in or around the home? Yes  If so, do they  handrails? Yes   Home free of loose throw rugs in walkways, pet beds, electrical cords, etc? Yes  Adequate lighting in your home to reduce risk of falls? Yes   ASSISTIVE DEVICES UTILIZED TO PREVENT FALLS:  Life alert? No  Use of a cane, walker or w/c? No  Grab bars in the bathroom? Yes  Shower chair or bench in shower? Yes  Elevated toilet seat or a handicapped toilet? Yes   DME ORDERS:  DME order needed?  No   TIMED UP AND GO:  Was the test performed? Yes .  Length of time to ambulate 10 feet: 5 sec.   GAIT:  Appearance of gait: Gait stead-fast and without the use of an assistive device.    Education: Fall risk prevention has been discussed.  Intervention(s) required? No    Depression Screen PHQ 2/9 Scores 05/03/2019 03/14/2019 01/17/2019 11/03/2018  PHQ - 2 Score 1 0 0 0  PHQ- 9 Score 3 0 - 0     Cognitive Function     6CIT Screen 05/03/2019 04/28/2018 04/19/2017  What Year? 0 points 0 points 0 points  What month? 0 points 0 points 0 points  What time? 0 points 0 points 0 points  Count back from 20 0 points 0 points 0 points  Months in reverse 0 points 0 points 0 points  Repeat phrase 0 points 2 points 0 points  Total Score 0 2 0    Immunization History  Administered Date(s) Administered  . Influenza, High Dose Seasonal PF 09/12/2018  . Influenza,inj,Quad PF,6+ Mos 08/15/2015  . Influenza-Unspecified 08/20/2011, 08/11/2012, 09/08/2013, 09/05/2014, 08/15/2015, 09/13/2016, 08/25/2017  . Pneumococcal Conjugate-13 12/13/2014  . Pneumococcal Polysaccharide-23 11/08/2000, 08/08/2006  . Tdap 11/08/2008, 03/14/2019  . Zoster 01/07/2004, 11/08/2008    Qualifies for Shingles Vaccine? Yes  Zostavax completed 2010. Due for Shingrix. Education has been provided regarding the importance of this vaccine. Pt has been advised to call insurance company to determine out of pocket expense. Advised may also receive vaccine at local pharmacy or Health Dept. Verbalized acceptance and  understanding.  Tdap: Up to date  Flu Vaccine: Up to date  Pneumococcal Vaccine: Up to date   Screening Tests Health Maintenance  Topic Date Due  . INFLUENZA VACCINE  06/09/2019  . TETANUS/TDAP  03/13/2029  . DEXA SCAN  Completed  . PNA vac Low Risk Adult  Completed    Cancer Screenings:  Colorectal Screening: Completed 05/08/09. No longer required.   Mammogram: Completed 12/26/14.  No longer required.    Bone Density: Completed 01/06/17. Results reflect OSTEOPOROSIS.   Lung Cancer Screening: (Low Dose CT Chest recommended if Age 10-80 years, 30 pack-year currently smoking OR have quit w/in 15years.) does not qualify.    Additional Screening:  Hepatitis C Screening: no longer required  Vision Screening: Recommended annual ophthalmology exams for early detection of glaucoma and other disorders of the eye. Is the patient up to date with their annual eye exam?  Yes  Who is the provider or what is the name of the office in which the pt attends annual eye exams? [facility] Screening: Recommended annual dental exams for proper oral hygiene  Community Resource Referral:  CRR required this visit?  No      Plan:     I have personally reviewed and addressed the Medicare Annual Wellness questionnaire and have noted the following  in the patient's chart:  A. Medical and social history B. Use of alcohol, tobacco or illicit drugs  C. Current medications and supplements D. Functional ability and status E.  Nutritional status F.  Physical activity G. Advance directives H. List of other physicians I.  Hospitalizations, surgeries, and ER visits in previous 12 months J.  [place] such as hearing and vision if needed, cognitive and depression L. Referrals and appointments   In addition, I have reviewed and discussed with patient certain preventive protocols, quality metrics, and best practice recommendations. A written personalized care plan for  preventive services as well as general preventive health recommendations were provided to patient.   Signed,  Clemetine Marker, LPN Nurse Health Advisor   Nurse Notes: pt doing well and appreciative of visit today

## 2019-05-09 ENCOUNTER — Ambulatory Visit: Payer: Self-pay | Admitting: Family Medicine

## 2019-05-14 ENCOUNTER — Ambulatory Visit (INDEPENDENT_AMBULATORY_CARE_PROVIDER_SITE_OTHER): Payer: Medicare Other | Admitting: Family Medicine

## 2019-05-14 ENCOUNTER — Encounter: Payer: Self-pay | Admitting: Family Medicine

## 2019-05-14 ENCOUNTER — Other Ambulatory Visit: Payer: Self-pay

## 2019-05-14 VITALS — BP 118/70 | HR 81 | Temp 97.8°F | Resp 16 | Ht 60.0 in | Wt 100.7 lb

## 2019-05-14 DIAGNOSIS — R531 Weakness: Secondary | ICD-10-CM

## 2019-05-14 DIAGNOSIS — R5383 Other fatigue: Secondary | ICD-10-CM

## 2019-05-14 DIAGNOSIS — K862 Cyst of pancreas: Secondary | ICD-10-CM

## 2019-05-14 DIAGNOSIS — R1011 Right upper quadrant pain: Secondary | ICD-10-CM

## 2019-05-14 DIAGNOSIS — R748 Abnormal levels of other serum enzymes: Secondary | ICD-10-CM

## 2019-05-14 DIAGNOSIS — K30 Functional dyspepsia: Secondary | ICD-10-CM | POA: Diagnosis not present

## 2019-05-14 DIAGNOSIS — I774 Celiac artery compression syndrome: Secondary | ICD-10-CM

## 2019-05-14 DIAGNOSIS — I771 Stricture of artery: Secondary | ICD-10-CM

## 2019-05-14 DIAGNOSIS — R739 Hyperglycemia, unspecified: Secondary | ICD-10-CM | POA: Diagnosis not present

## 2019-05-14 NOTE — Progress Notes (Signed)
Name: [name]   MRN: [id number]    DOB: [birth year]   Date:05/14/2019       Progress Note  Subjective  Chief Complaint  Chief Complaint  Patient presents with  . Follow-up  . Fatigue    HPI  Pt presents to follow up:  Weakness and Fatigue: She was seen by Dr. Ancil Boozer for this and RUQ abdominal pain in March 2020.  Her CMP was normal, CBC was normal, Lipase was mildly elevated - we will recheck this today.  She has been having to take a nap daily now.  She also had CXR done in March 2020 which was negative except for COPD. She feels tired/weaker after eating.  She does have some shortness of breath even with conversation - does not want to try an inhaler.  Right-sided pain: She has right sided abdominal pain that sometimes radiates into the right upper leg.  Her pain is mostly at night, she is not sleeping very well.   - She notes having a stent in the celiac trunk (Followed by Dr. Lucky Cowboy annually) and feels like her discomfort is often in the location of the stent - states has not slept well since the stent was placed. On 07/21/2022 - Dr. Lucky Cowboy noted that the patient was having ongoing abdominal pain with unclear etiology - MRI had shown no obvious cause and the celiac artery stent was viewed by himself on duplex with no issues.   - May 2019 showed 10mm homogenous cyst and several other tiny cystic foci of the pancrease.  More recently, as above she had a mildly elevated lipase in March 2020 which we will recheck today. - She does note that if any type of cancer is found that she will likely not seek treatment.  Patient Active Problem List   Diagnosis Date Noted  . Chronic pain in right shoulder 12/06/2018  . Primary osteoarthritis of both hands 06/05/2018  . Pancreatic cyst 04/10/2018  . Myalgia due to statin 04/10/2018  . Raynaud's phenomenon without gangrene 04/04/2018  . Weakness 03/14/2018  . Varicose veins of leg with pain, right 09/13/2017  . History of lumbar laminectomy  for spinal cord decompression 07/19/2017  . DNAR (do not attempt resuscitation) 04/19/2017  . Compression fracture of L4 lumbar vertebra 12/10/2016  . 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 Abdominal pain 04/09/2016  . Abdominal bloating 04/09/2016  . Ovarian cyst 03/09/2016  . Endometrial thickening on ultra sound 03/09/2016  . Pelvic pain in female 02/29/2016  . Preventative health care 01/03/2016  . Hyperglycemia 12/19/2015  . Subjective change in urination 12/19/2015  . Compression fracture of thoracic spine, non-traumatic ([place]) 08/19/2015  . Chronic thoracic spine pain 08/15/2015  . Low back pain 08/15/2015  . Compression fracture of L2 lumbar vertebra ([place]) 08/15/2015  . Hyperlipidemia   . Osteoporosis   . Rheumatoid arthritis ([place])   . Ovarian lump   . Atherosclerosis of abdominal aorta ([place])   . Lumbar spinal stenosis   . Chronic kidney disease, stage III (moderate) ([facility])   . CAD (coronary artery disease) 06/24/2014  . History of adenomatous polyp of colon 06/14/2014  . Pulmonary nodule seen on imaging study 07/13/2012  . Irritable bowel syndrome 02/16/2012    Social History   Tobacco Use  . Smoking status: Never Smoker  . Smokeless tobacco: Never Used  . Tobacco comment: smoking cessation materials not required  Substance Use Topics  . Alcohol use: No    Alcohol/week: 0.0 standard drinks  Current Outpatient Medications:  .  acetaminophen (TYLENOL) 500 MG tablet, Take 500 mg by mouth every 6 (six) hours as needed for mild pain., Disp: , Rfl:  .  Ascorbic Acid (VITAMIN C CR) 500 MG CPCR, Take 500 mg by mouth daily., Disp: , Rfl:  .  aspirin EC 81 MG tablet, Take by mouth., Disp: , Rfl:  .  atorvastatin (LIPITOR) 10 MG tablet, TAKE 1 TABLET BY MOUTH IN THE EVENING, Disp: 30 tablet, Rfl: 6 .  Calcium Carbonate-Vitamin D 600-200 MG-UNIT CAPS, Take by mouth., Disp: , Rfl:  .  Cholecalciferol (VITAMIN D3) 3000 UNITS TABS, Take 3,000 Units by mouth 2 (two) times  daily. , Disp: , Rfl:  .  CINNAMON PO, Take 1,000 mg by mouth daily. , Disp: , Rfl:  .  clotrimazole-betamethasone (LOTRISONE) cream, Apply topically 2 (two) times daily., Disp: 30 g, Rfl: 1 .  co-enzyme Q-10 30 MG capsule, Take 30 mg by mouth daily. , Disp: , Rfl:  .  fluticasone (FLONASE) 50 MCG/ACT nasal spray, Place 1 spray into both nostrils daily., Disp: , Rfl:  .  Ginkgo Biloba (GNP GINGKO BILOBA EXTRACT PO), Take 1 tablet by mouth daily., Disp: , Rfl:  .  Glucosamine Sulfate 500 MG CAPS, Take 500 mg by mouth daily., Disp: , Rfl:  .  ketotifen (ZADITOR) 0.025 % ophthalmic solution, Place 1 drop into both eyes 2 (two) times daily. , Disp: , Rfl:  .  Lactobacillus (PROBIOTIC ACIDOPHILUS PO), Take 1 capsule by mouth daily. , Disp: , Rfl:  .  loratadine (CLARITIN) 10 MG tablet, Take 10 mg by mouth daily as needed for allergies., Disp: , Rfl:  .  Magnesium 400 MG CAPS, Take 400 mg by mouth daily., Disp: , Rfl:  .  Multiple Vitamin (MULTIVITAMIN) capsule, Take 1 capsule by mouth daily., Disp: , Rfl:  .  neomycin-bacitracin-polymyxin (NEOSPORIN) [phone] ointment, Apply 1 application topically daily as needed (itching). , Disp: , Rfl:  .  Omega-3 Fatty Acids (OMEGA 3 PO), Take 520 mg by mouth daily. , Disp: , Rfl:  .  Pramoxine-HC (HYDROCORTISONE ACE-PRAMOXINE) 2.5-1 % CREA, Apply topically as needed. , Disp: , Rfl:  .  sodium chloride (OCEAN) 0.65 % SOLN nasal spray, Place 1 spray into both nostrils every 4 (four) hours as needed for congestion. , Disp: , Rfl:  .  vitamin B-12 (CYANOCOBALAMIN) 1000 MCG tablet, Take 1,000 mcg by mouth daily., Disp: , Rfl:  .  Turmeric 450 MG CAPS, Take 450 Units by mouth., Disp: , Rfl:   Allergies  Allergen Reactions  . Ibandronic Acid Other (See Comments)    Achy all over. Flu like S/S  . Other Other (See Comments)    Achy all over. Flu like S/S Dysphagia  . Actonel [Risedronate] Other (See Comments)    Dysphagia  . Raloxifene Other (See Comments)     Mood swings  . Versed [Midazolam] Other (See Comments)    Difficult waking up and memory loss  . Doxycycline Rash  . Prednisone Rash    I personally reviewed active problem list, medication list, allergies, notes from last encounter, lab results with the patient/caregiver today.  ROS  Ten systems reviewed and is negative except as mentioned in HPI  Objective  Vitals:   05/14/19 1433  BP: 118/70  Pulse: 81  Resp: 16  Temp: 97.8 F (36.6 C)  TempSrc: Oral  SpO2: 99%  Weight: 100 lb 11.2 oz (45.7 kg)  Height: 5' (1.524 m)  Body mass index is 19.67 kg/m.  Nursing Note and Vital Signs reviewed.  Physical Exam  Constitutional: Patient appears well-developed and well-nourished. No distress.  HENT: Head: Normocephalic and atraumatic. Ears: bilateral TMs with no erythema or effusion; Nose: Nose normal. Mouth/Throat: Oropharynx is clear and moist. No oropharyngeal exudate or tonsillar swelling.  Eyes: Conjunctivae and EOM are normal. No scleral icterus.  Pupils are equal, round, and reactive to light.  Neck: Normal range of motion. Neck supple. No JVD present. No thyromegaly present.  Cardiovascular: Normal rate, regular rhythm and normal heart sounds.  No murmur heard. No BLE edema. Pulmonary/Chest: Effort normal and breath sounds normal. No respiratory distress. Abdominal: Soft. Bowel sounds are normal, no distension. There is no tenderness. No masses. Musculoskeletal: Normal range of motion, no joint effusions. No gross deformities Neurological: Pt is alert and oriented to person, place, and time. No cranial nerve deficit. Coordination, balance, strength, speech and gait are normal.  Skin: Skin is warm and dry. No rash noted. No erythema.  Psychiatric: Patient has a normal mood and affect. behavior is normal. Judgment and thought content normal.  No results found for this or any previous visit (from the past 72 hour(s)).  Assessment & Plan  1. RUQ pain - Ambulatory  referral to Gastroenterology - Unclear etiology of her discomfort - possibly the stent placed is caused some sort of discomfort, she has not had follow up eval of pancreatic lesion yet (it was recommended as a 2 year follow up - is not due until 2021), pancreatic etiology is considered - especially with mildly elevated lipase.  Recheck lipase today to trend levels.  2. Indigestion - Ambulatory referral to Gastroenterology  3. Weakness - Ambulatory referral to Gastroenterology  4. Fatigue, unspecified type - Hemoglobin A1c - TSH  5. Elevated lipase - Lipase - Ambulatory referral to Gastroenterology  6. Celiac artery stenosis ([place]) - Ambulatory referral to Gastroenterology  7. Pancreatic cyst - Ambulatory referral to Gastroenterology  8. Hyperglycemia - Hemoglobin A1c  -Red flags and when to present for emergency care or RTC including fever >101.45F, chest pain, shortness of breath, new/worsening/un-resolving symptoms, reviewed with patient at time of visit. Follow up and care instructions discussed and provided in AVS.

## 2019-05-15 LAB — TSH: TSH: 2.24 mIU/L (ref 0.40–4.50)

## 2019-05-15 LAB — HEMOGLOBIN A1C
Hgb A1c MFr Bld: 6.1 % of total Hgb — ABNORMAL HIGH (ref <5.7)
Mean Plasma Glucose: 128 (calc)
eAG (mmol/L): 7.1 (calc)

## 2019-05-15 LAB — LIPASE: Lipase: 84 U/L — ABNORMAL HIGH (ref 7–60)

## 2019-05-17 ENCOUNTER — Telehealth (INDEPENDENT_AMBULATORY_CARE_PROVIDER_SITE_OTHER): Payer: Self-pay | Admitting: Vascular Surgery

## 2019-05-17 DIAGNOSIS — R531 Weakness: Secondary | ICD-10-CM | POA: Diagnosis not present

## 2019-05-17 DIAGNOSIS — R1011 Right upper quadrant pain: Secondary | ICD-10-CM | POA: Diagnosis not present

## 2019-05-17 DIAGNOSIS — I700 Atherosclerosis of aorta: Secondary | ICD-10-CM | POA: Diagnosis not present

## 2019-05-17 NOTE — Telephone Encounter (Signed)
I spoke with Eulogio Ditch NP and she stated patient 07/24/2019 appointment can be moved up and add on ABI

## 2019-05-25 DIAGNOSIS — H5712 Ocular pain, left eye: Secondary | ICD-10-CM | POA: Diagnosis not present

## 2019-05-31 ENCOUNTER — Telehealth (INDEPENDENT_AMBULATORY_CARE_PROVIDER_SITE_OTHER): Payer: Self-pay | Admitting: Vascular Surgery

## 2019-05-31 NOTE — Telephone Encounter (Signed)
is having a pain in there stomach and is feeling over all really weak and would like to mover her appts up per her gastro doctor. Please advise

## 2019-05-31 NOTE — Telephone Encounter (Signed)
If the schedule permits, the patient can be seen sooner. She will need a mesenteric duplex and ABI with her visit.

## 2019-05-31 NOTE — Telephone Encounter (Signed)
Attempted to contact patient to schedule/ LMVM

## 2019-05-31 NOTE — Telephone Encounter (Signed)
Patient has apt scheduled for 07/03/19 at 113am with [facility]. Please advise if patient apt can be moved due to abdominal pain and weakness. AS, CMA

## 2019-06-11 ENCOUNTER — Other Ambulatory Visit (INDEPENDENT_AMBULATORY_CARE_PROVIDER_SITE_OTHER): Payer: Self-pay | Admitting: Vascular Surgery

## 2019-06-11 ENCOUNTER — Telehealth: Payer: Self-pay

## 2019-06-11 DIAGNOSIS — Z95820 Peripheral vascular angioplasty status with implants and grafts: Secondary | ICD-10-CM

## 2019-06-11 DIAGNOSIS — I774 Celiac artery compression syndrome: Secondary | ICD-10-CM

## 2019-06-11 DIAGNOSIS — R1084 Generalized abdominal pain: Secondary | ICD-10-CM

## 2019-06-11 DIAGNOSIS — R531 Weakness: Secondary | ICD-10-CM

## 2019-06-11 DIAGNOSIS — I700 Atherosclerosis of aorta: Secondary | ICD-10-CM

## 2019-06-11 DIAGNOSIS — I771 Stricture of artery: Secondary | ICD-10-CM

## 2019-06-11 NOTE — Telephone Encounter (Signed)
Copied from [place] [id number]. Topic: General - Other >> Jun 11, 2019  3:01 PM Celene Kras A wrote: Reason for CRM: Pt called stating she would like to transfer care to Dr. Derrel Nip. Pt states she got the okay from Dr. Derrel Nip. Please advise.

## 2019-06-12 NOTE — Telephone Encounter (Signed)
Will Dr. Derrel Nip except her as a new patient?

## 2019-06-12 NOTE — Telephone Encounter (Signed)
YES, I DID ACCEPT HER AS MY PATIENT.  YOU CAN SET HER UP

## 2019-06-13 ENCOUNTER — Encounter (INDEPENDENT_AMBULATORY_CARE_PROVIDER_SITE_OTHER): Payer: Self-pay | Admitting: Nurse Practitioner

## 2019-06-13 ENCOUNTER — Ambulatory Visit (INDEPENDENT_AMBULATORY_CARE_PROVIDER_SITE_OTHER): Payer: Medicare Other | Admitting: Nurse Practitioner

## 2019-06-13 ENCOUNTER — Other Ambulatory Visit: Payer: Self-pay

## 2019-06-13 ENCOUNTER — Ambulatory Visit (INDEPENDENT_AMBULATORY_CARE_PROVIDER_SITE_OTHER): Payer: Medicare Other

## 2019-06-13 VITALS — BP 218/97 | HR 62 | Resp 16 | Wt 99.8 lb

## 2019-06-13 DIAGNOSIS — Z95820 Peripheral vascular angioplasty status with implants and grafts: Secondary | ICD-10-CM | POA: Diagnosis not present

## 2019-06-13 DIAGNOSIS — I700 Atherosclerosis of aorta: Secondary | ICD-10-CM

## 2019-06-13 DIAGNOSIS — R1084 Generalized abdominal pain: Secondary | ICD-10-CM

## 2019-06-13 DIAGNOSIS — R531 Weakness: Secondary | ICD-10-CM | POA: Diagnosis not present

## 2019-06-13 DIAGNOSIS — E782 Mixed hyperlipidemia: Secondary | ICD-10-CM | POA: Diagnosis not present

## 2019-06-13 DIAGNOSIS — I774 Celiac artery compression syndrome: Secondary | ICD-10-CM

## 2019-06-13 DIAGNOSIS — I10 Essential (primary) hypertension: Secondary | ICD-10-CM | POA: Diagnosis not present

## 2019-06-13 DIAGNOSIS — I771 Stricture of artery: Secondary | ICD-10-CM

## 2019-06-13 NOTE — Progress Notes (Signed)
SUBJECTIVE:  Patient ID: [name], female    DOB: 1926-12-09, 85 y.o.   MRN: [id number] Chief Complaint  Patient presents with   Follow-up    ultrasound follow up    HPI  [name] is a 85 y.o. female that presents today for referral by her gastroenterologist with concerns for abdominal pain as well as generalized weakness.  The patient states that she originally had the pain began when she lays down and there is a tightness in her mid drift that happens more so at night.  She states that sometimes it happens when she is upright now as well.  She said that the pain is not severe it is more so subtle but noticeable.  She also endorses having some generalized weakness, that prompts her to go rest and lay down periodically.  She states that it is not that her legs are giving out from under her or that she is unable to continue walking but just a generalized weakness to where she feels that she is unable to do anything.  She denies any nausea or vomiting.  She denies any severe abdominal pain.  She denies any food phobia.  She denies any postprandial pain symptoms pain.  She denies any excessive weight loss.  She denies any fever, chills, nausea, vomiting or diarrhea.  The patient recently had her carotid arteries checked by a mobile screening service and she states that the report was favorable showing that she had minimal plaque within her carotid arteries.  Today the patient underwent noninvasive studies.  She had an ABI her right lower extremity is 0.89 and her left is 0.90.  Previous ABIs done on 01/04/2018 reveal an ABI of 1.15 on the right and 1.16 on the left.  The bilateral tibial arteries have triphasic waveforms with good toe waveforms bilaterally.  The patient also underwent a mesenteric duplex.  She has a previous history of stents in 2018.  There are some elevated velocities at the celiac artery region however no severe stenosis.  There is no change from the previous  exam on 07/21/2018.  The stent is patent.  Past Medical History:  Diagnosis Date   Allergy    Arthritis    Atherosclerosis    Atherosclerosis of abdominal aorta (HCC)    Chronic kidney disease    stage 2   CKD (chronic kidney disease) stage 2, GFR 60-89 ml/min    Collagen vascular disease ([place])    Diverticulosis 2008   GERD (gastroesophageal reflux disease)    Heart murmur    Hyperlipidemia    Lumbar spinal stenosis    Osteoporosis    Ovarian lump    RA (rheumatoid arthritis) ([place])    Raynaud's disease    Rheumatoid arthritis ([place])    Spinal stenosis     Past Surgical History:  Procedure Laterality Date   BREAST SURGERY Left 1965   Benign biopsy   CHOLECYSTECTOMY  2005   [place]   COLONOSCOPY  2008   Dr. Donnella Sham   EXCISION OF BREAST BIOPSY Left 1965   benign   EYE SURGERY Bilateral    Cataract Extraction with IOL   KYPHOPLASTY N/A 12/17/2016   Procedure: KYPHOPLASTY;  Surgeon: Hessie Knows, MD;  Location: [facility];  Service: Orthopedics;  Laterality: N/A;  l4   LUMBAR LAMINECTOMY  05/09/2017   L2-L5   VISCERAL ANGIOGRAPHY N/A 08/15/2017   Procedure: VISCERAL ANGIOGRAPHY;  Surgeon: Algernon Huxley, MD;  Location: [place] [facility];  Service:  Cardiovascular;  Laterality: N/A;   VISCERAL ANGIOGRAPHY N/A 10/05/2017   Procedure: VISCERAL ANGIOGRAPHY;  Surgeon: Algernon Huxley, MD;  Location: [place] [facility];  Service: Cardiovascular;  Laterality: N/A;   VISCERAL ARTERY INTERVENTION N/A 08/15/2017   Procedure: VISCERAL ARTERY INTERVENTION;  Surgeon: Algernon Huxley, MD;  Location: [place] [facility];  Service: Cardiovascular;  Laterality: N/A;    Social History   Socioeconomic History   Marital status: Widowed    Spouse name: Gwyndolyn Saxon   Number of children: 3   Years of education: some college   Highest education level: 12th grade  Occupational History    Employer: RETIRED  Scientist, product/process development strain: Not hard at all     Food insecurity    Worry: Never true    Inability: Never true   Transportation needs    Medical: No    Non-medical: No  Tobacco Use   Smoking status: Never Smoker   Smokeless tobacco: Never Used   Tobacco comment: smoking cessation materials not required  Substance and Sexual Activity   Alcohol use: No    Alcohol/week: 0.0 standard drinks   Drug use: No   Sexual activity: Not Currently  Lifestyle   Physical activity    Days per week: 7 days    Minutes per session: 30 min   Stress: Not at all  Relationships   Social connections    Talks on phone: More than three times a week    Gets together: Three times a week    Attends religious service: More than 4 times per year    Active member of club or organization: Yes    Attends meetings of clubs or organizations: More than 4 times per year    Relationship status: Widowed   Intimate partner violence    Fear of current or ex partner: No    Emotionally abused: No    Physically abused: No    Forced sexual activity: No  Other Topics Concern   Not on file  Social History Narrative   Not on file    Family History  Problem Relation Age of Onset   Heart disease Mother    Hypertension Father    Heart disease Brother 36       heart attack   Cancer Brother        bladder   Diabetes Sister    Alcohol abuse Sister    Kidney disease Sister    Diabetes Brother    Stroke Brother    Diabetes Maternal Grandmother    Heart disease Maternal Grandmother    Cancer Brother        lung    Allergies  Allergen Reactions   Ibandronic Acid Other (See Comments)    Achy all over. Flu like S/S   Other Other (See Comments)    Achy all over. Flu like S/S Dysphagia   Actonel [Risedronate] Other (See Comments)    Dysphagia   Raloxifene Other (See Comments)    Mood swings   Versed [Midazolam] Other (See Comments)    Difficult waking up and memory loss   Doxycycline Rash   Prednisone Rash     Review  of Systems   Review of Systems: Negative Unless Checked Constitutional: [] Weight loss  [] Fever  [] Chills Cardiac: [] Chest pain   []  Atrial Fibrillation  [] Palpitations   [] Shortness of breath when laying flat   [] Shortness of breath with exertion. [] Shortness of breath at rest Vascular:  [] Pain in  legs with walking   [] Pain in legs with standing [] Pain in legs when laying flat   [] Claudication    [] Pain in feet when laying flat    [] History of DVT   [] Phlebitis   [] Swelling in legs   [x] Varicose veins   [] Non-healing ulcers Pulmonary:   [] Uses home oxygen   [] Productive cough   [] Hemoptysis   [] Wheeze  [] COPD   [] Asthma Neurologic:  [] Dizziness   [] Seizures  [] Blackouts [] History of stroke   [] History of TIA  [] Aphasia   [] Temporary Blindness   [x] Weakness or numbness in arm   [x] Weakness or numbness in leg Musculoskeletal:   [] Joint swelling   [] Joint pain   [] Low back pain  []  History of Knee Replacement [x] Arthritis [] back Surgeries  []  Spinal Stenosis    Hematologic:  [] Easy bruising  [] Easy bleeding   [] Hypercoagulable state   [] Anemic Gastrointestinal:  [] Diarrhea   [] Vomiting  [] Gastroesophageal reflux/heartburn   [] Difficulty swallowing. [] Abdominal pain Genitourinary:  [] Chronic kidney disease   [] Difficult urination  [] Anuric   [] Blood in urine [] Frequent urination  [] Burning with urination   [] Hematuria Skin:  [] Rashes   [] Ulcers [] Wounds Psychological:  [x] History of anxiety   []  History of major depression  []  Memory Difficulties      OBJECTIVE:   Physical Exam  BP (!) 218/97 (BP Location: Right Arm)    Pulse 62    Resp 16    Wt 99 lb 12.8 oz (45.3 kg)    BMI 19.49 kg/m   Gen: WD/WN, NAD Head: [place]/AT, No temporalis wasting.  Ear/Nose/Throat: Hearing grossly intact, nares w/o erythema or drainage Eyes: PER, EOMI, sclera nonicteric.  Neck: Supple, no masses.  No JVD.  Pulmonary:  Good air movement, no use of accessory muscles.  Cardiac: RRR Vascular:  Vessel Right Left    Radial Palpable Palpable  Dorsalis Pedis Palpable Palpable  Posterior Tibial Palpable Palpable   Gastrointestinal: soft, non-distended. No guarding/no peritoneal signs.  Musculoskeletal: M/S 5/5 throughout.  No deformity or atrophy.  Neurologic: Pain and light touch intact in extremities.  Symmetrical.  Speech is fluent. Motor exam as listed above. Psychiatric: Judgment intact, Mood & affect appropriate for pt's clinical situation. Dermatologic: No Venous rashes. No Ulcers Noted.  No changes consistent with cellulitis. Lymph : No Cervical lymphadenopathy, no lichenification or skin changes of chronic lymphedema.       ASSESSMENT AND PLAN:  1. Celiac artery stenosis (HCC) Recommend:  The patient has evidence of chronic asymptomatic mesenteric atherosclerosis.  The patient denies lifestyle limiting changes at this point in time.  Given the lack of symptoms no intervention is warranted at this time.   No further invasive studies, angiography or surgery at this time  The patient should continue walking and begin a more formal exercise program.   The patient should continue antiplatelet therapy and aggressive treatment of the lipid abnormalities  Patient should undergo noninvasive studies as ordered. The patient will follow up with me after the studies.   - VAS [place] MESENTERIC; Future  2. Mixed hyperlipidemia Continue statin as ordered and reviewed, no changes at this time   3. Weakness Based upon the patient's description of weakness, I do not feel that it is vascular in nature.  The patient's noninvasive studies today do not indicate advanced enough peripheral artery disease to cause lower extremity weakness.  I have advised the patient to follow-up with her PCP for continued work-up as to possible causes for the generalized weakness that she feels.  4.  Atherosclerosis of abdominal aorta (HCC) Based on the patient's noninvasive study she does have some mild peripheral artery  disease.  We will have the patient return in 12 months for noninvasive studies due to the decrease in her ABI compared to the prior study on 01/04/2018.  Patient is advised to contact us if she experiences a change in her symptoms such as increasing claudication, rest pain or discoloration of her toes and feet. - VAS [place] ABI WITH/WO TBI; Future  5. Essential hypertension The patient had extremely elevated blood pressures today in the office.  The patient states that this is something that she generally never experiences at home in the most she may experience a systolic blood pressure in the 170s.  But typically she runs about in the 140s.  I have advised the patient that she should check her blood pressure when she is able to relax at home to see if it continues to be elevated.  I have advised her that if her blood pressure is in the 190s or higher she should seek emergency attention.  If it is in the 180s or lower the patient should contact her PCP for an office visit in order to determine if beginning blood pressure medications would be the best option for the patient.    Current Outpatient Medications on File Prior to Visit  Medication Sig Dispense Refill   acetaminophen (TYLENOL) 500 MG tablet Take 500 mg by mouth every 6 (six) hours as needed for mild pain.     Ascorbic Acid (VITAMIN C CR) 500 MG CPCR Take 500 mg by mouth daily.     aspirin EC 81 MG tablet Take by mouth.     atorvastatin (LIPITOR) 10 MG tablet TAKE 1 TABLET BY MOUTH IN THE EVENING 30 tablet 6   Calcium Carbonate-Vitamin D 600-200 MG-UNIT CAPS Take by mouth.     Cholecalciferol (VITAMIN D3) 3000 UNITS TABS Take 3,000 Units by mouth 2 (two) times daily.      CINNAMON PO Take 1,000 mg by mouth daily.      clotrimazole-betamethasone (LOTRISONE) cream Apply topically 2 (two) times daily. 30 g 1   co-enzyme Q-10 30 MG capsule Take 30 mg by mouth daily.      erythromycin ophthalmic ointment APPLY A SMALL AMOUNT IN LEFT EYE  NIGHTLY     fluticasone (FLONASE) 50 MCG/ACT nasal spray Place 1 spray into both nostrils daily.     Ginkgo Biloba (GNP GINGKO BILOBA EXTRACT PO) Take 1 tablet by mouth daily.     Glucosamine Sulfate 500 MG CAPS Take 500 mg by mouth daily.     ketotifen (ZADITOR) 0.025 % ophthalmic solution Place 1 drop into both eyes 2 (two) times daily.      Lactobacillus (PROBIOTIC ACIDOPHILUS PO) Take 1 capsule by mouth daily.      loratadine (CLARITIN) 10 MG tablet Take 10 mg by mouth daily as needed for allergies.     Magnesium 400 MG CAPS Take 400 mg by mouth daily.     Multiple Vitamin (MULTIVITAMIN) capsule Take 1 capsule by mouth daily.     neomycin-bacitracin-polymyxin (NEOSPORIN) [phone] ointment Apply 1 application topically daily as needed (itching).      Omega-3 Fatty Acids (OMEGA 3 PO) Take 520 mg by mouth daily.      pantoprazole (PROTONIX) 40 MG tablet Take by mouth.     Pramoxine-HC (HYDROCORTISONE ACE-PRAMOXINE) 2.5-1 % CREA Apply topically as needed.      sodium chloride (OCEAN) 0.65 % SOLN  nasal spray Place 1 spray into both nostrils every 4 (four) hours as needed for congestion.      trimethoprim-polymyxin b (POLYTRIM) ophthalmic solution INSTILL 1 DROP INTO LEFT EYE THREE TIMES DAILY FOR 7 DAYS     Turmeric 450 MG CAPS Take 450 Units by mouth.     vitamin B-12 (CYANOCOBALAMIN) 1000 MCG tablet Take 1,000 mcg by mouth daily.     No current facility-administered medications on file prior to visit.     There are no Patient Instructions on file for this visit. Return in about 1 year (around 06/12/2020) for PAD.   Kris Hartmann, NP  This note was completed with Sales executive.  Any errors are purely unintentional.

## 2019-06-27 DIAGNOSIS — I10 Essential (primary) hypertension: Secondary | ICD-10-CM | POA: Diagnosis not present

## 2019-06-27 DIAGNOSIS — I2510 Atherosclerotic heart disease of native coronary artery without angina pectoris: Secondary | ICD-10-CM | POA: Diagnosis not present

## 2019-06-27 DIAGNOSIS — I700 Atherosclerosis of aorta: Secondary | ICD-10-CM | POA: Diagnosis not present

## 2019-06-27 DIAGNOSIS — E782 Mixed hyperlipidemia: Secondary | ICD-10-CM | POA: Diagnosis not present

## 2019-07-03 ENCOUNTER — Ambulatory Visit (INDEPENDENT_AMBULATORY_CARE_PROVIDER_SITE_OTHER): Payer: Medicare Other | Admitting: Vascular Surgery

## 2019-07-03 ENCOUNTER — Encounter (INDEPENDENT_AMBULATORY_CARE_PROVIDER_SITE_OTHER): Payer: Medicare Other

## 2019-07-09 ENCOUNTER — Telehealth (INDEPENDENT_AMBULATORY_CARE_PROVIDER_SITE_OTHER): Payer: Self-pay | Admitting: Vascular Surgery

## 2019-07-09 NOTE — Telephone Encounter (Signed)
Patient scheduled.

## 2019-07-11 ENCOUNTER — Other Ambulatory Visit (INDEPENDENT_AMBULATORY_CARE_PROVIDER_SITE_OTHER): Payer: Self-pay

## 2019-07-11 ENCOUNTER — Telehealth (INDEPENDENT_AMBULATORY_CARE_PROVIDER_SITE_OTHER): Payer: Self-pay

## 2019-07-11 MED ORDER — ATORVASTATIN CALCIUM 10 MG PO TABS: 10.0000 mg | ORAL_TABLET | Freq: Every evening | ORAL | 6 refills | Status: DC

## 2019-07-11 NOTE — Telephone Encounter (Signed)
Patient called for refill to be sent to pharmacy for Atorvastatin and I spoke with Eulogio Ditch NP and she inform that the medication can be refill.Also Eulogio Ditch NP advise that typically lab work is done to monitor this medication and next time she should consult with her primary care physician. I left a message on patient voicemail with information.

## 2019-07-12 ENCOUNTER — Other Ambulatory Visit: Payer: Self-pay | Admitting: Gastroenterology

## 2019-07-12 DIAGNOSIS — R1011 Right upper quadrant pain: Secondary | ICD-10-CM

## 2019-07-12 DIAGNOSIS — R1031 Right lower quadrant pain: Secondary | ICD-10-CM

## 2019-07-24 ENCOUNTER — Encounter (INDEPENDENT_AMBULATORY_CARE_PROVIDER_SITE_OTHER): Payer: Medicare Other

## 2019-07-24 ENCOUNTER — Other Ambulatory Visit: Payer: Self-pay

## 2019-07-24 ENCOUNTER — Ambulatory Visit
Admission: RE | Admit: 2019-07-24 | Discharge: 2019-07-24 | Disposition: A | Discharge status: Home / Self Care | Payer: Medicare Other | Source: Ambulatory Visit | Attending: Gastroenterology | Admitting: Gastroenterology

## 2019-07-24 ENCOUNTER — Ambulatory Visit (INDEPENDENT_AMBULATORY_CARE_PROVIDER_SITE_OTHER): Payer: Medicare Other | Admitting: Vascular Surgery

## 2019-07-24 DIAGNOSIS — R1011 Right upper quadrant pain: Secondary | ICD-10-CM | POA: Diagnosis not present

## 2019-07-24 DIAGNOSIS — R1031 Right lower quadrant pain: Secondary | ICD-10-CM | POA: Insufficient documentation

## 2019-07-24 LAB — POCT I-STAT CREATININE: Creatinine, Ser: 0.6 mg/dL (ref 0.44–1.00)

## 2019-07-24 MED ORDER — IOHEXOL 300 MG/ML  SOLN
75.0000 mL | Freq: Once | INTRAMUSCULAR | Status: AC | PRN
  Administered 2019-07-24: 75 mL via INTRAVENOUS

## 2019-08-02 DIAGNOSIS — R768 Other specified abnormal immunological findings in serum: Secondary | ICD-10-CM | POA: Diagnosis not present

## 2019-08-02 DIAGNOSIS — M19041 Primary osteoarthritis, right hand: Secondary | ICD-10-CM | POA: Diagnosis not present

## 2019-08-02 DIAGNOSIS — M19042 Primary osteoarthritis, left hand: Secondary | ICD-10-CM | POA: Diagnosis not present

## 2019-08-03 ENCOUNTER — Other Ambulatory Visit: Payer: Self-pay

## 2019-08-07 ENCOUNTER — Other Ambulatory Visit: Payer: Self-pay

## 2019-08-07 ENCOUNTER — Ambulatory Visit (INDEPENDENT_AMBULATORY_CARE_PROVIDER_SITE_OTHER): Payer: Medicare Other | Admitting: Internal Medicine

## 2019-08-07 ENCOUNTER — Ambulatory Visit (INDEPENDENT_AMBULATORY_CARE_PROVIDER_SITE_OTHER): Payer: Medicare Other

## 2019-08-07 VITALS — BP 128/64 | HR 78 | Temp 97.4°F | Resp 15 | Ht 60.0 in | Wt 102.0 lb

## 2019-08-07 DIAGNOSIS — M4804 Spinal stenosis, thoracic region: Secondary | ICD-10-CM | POA: Diagnosis not present

## 2019-08-07 DIAGNOSIS — M546 Pain in thoracic spine: Secondary | ICD-10-CM

## 2019-08-07 DIAGNOSIS — R1013 Epigastric pain: Secondary | ICD-10-CM

## 2019-08-07 DIAGNOSIS — K862 Cyst of pancreas: Secondary | ICD-10-CM | POA: Diagnosis not present

## 2019-08-07 DIAGNOSIS — E782 Mixed hyperlipidemia: Secondary | ICD-10-CM

## 2019-08-07 DIAGNOSIS — Z23 Encounter for immunization: Secondary | ICD-10-CM

## 2019-08-07 DIAGNOSIS — G8929 Other chronic pain: Secondary | ICD-10-CM

## 2019-08-07 DIAGNOSIS — M810 Age-related osteoporosis without current pathological fracture: Secondary | ICD-10-CM

## 2019-08-07 NOTE — Assessment & Plan Note (Signed)
Managed with atorvastatin given PAD and CAD   Lab Results  Component Value Date   CHOL 158 01/01/2019   HDL 77 01/01/2019   LDLCALC 63 01/01/2019   TRIG 96 01/01/2019   CHOLHDL 2.1 01/01/2019

## 2019-08-07 NOTE — Progress Notes (Signed)
Subjective:  Patient ID: [name], female    DOB: 07-Dec-1926  Age: 85 y.o. MRN: [id number]  CC: The primary encounter diagnosis was Chronic bilateral thoracic back pain. Diagnoses of Epigastric pain, Need for immunization against influenza, Pancreatic cyst, Chronic thoracic spine pain, Mixed hyperlipidemia, and Osteoporosis, postmenopausal were also pertinent to this visit.  HPI [name] presents for establishment of care. She is an 85 yr old female wit a history of osteoporosis with prior vertebral fractures,  PAD s/p celiac artery stenting , hyperlipidemia and CAD who presents for transition of care   Cc:  abd pain :  Persistent periumbilical radiates to right inguinal area . Occurs only when supine. Present for over one year. Workup for masses, recurrent mesenteric ischemia negative. Referred to GI.  Has been Taking protonix sice May/June ,  No difference in symptoms of right sided pain    Epigastric pain . Pain is brought on by lying down . Not brought on by eating . Weight loss of 6 to7 lbs but now stable  Appetite has been normal .  CT scan was normal.   2) urge incontinence ; meds deferred after discussion  3) HTN:  white coat  Only, based on normal home readings ad normal today  4) ? Ovarian cyst:  Nothing found when referred to gyn, nothing seen on CT abd pelvis done last week    History of vertebral fractures surgery for treatment  spinal stenosis July 2018 .  back pain no longer daily.    Sees [facility] for PAD. Stent in Nov 2018 celiac artery .  Had excruciating pain right after the stent was placed   Had CT scan last week by PA Ellin Mayhew  Gustavo Lah). No recent EGD   PAD: celiac stent. osteoporosis with prior fractures .  Sleeps on egg crate mattress on a soft mattress  Diverticulsosis   History [name] has a past medical history of Allergy, Arthritis, Atherosclerosis, Atherosclerosis of abdominal aorta ([place]), Chronic kidney disease, CKD (chronic kidney disease)  stage 2, GFR 60-89 ml/min, Collagen vascular disease ([place]), Diverticulosis (2008), GERD (gastroesophageal reflux disease), Heart murmur, Hyperlipidemia, Lumbar spinal stenosis, Osteoporosis, Ovarian lump, RA (rheumatoid arthritis) ([place]), Raynaud's disease, Rheumatoid arthritis ([place]), and Spinal stenosis.   She has a past surgical history that includes Excision of breast biopsy (Left, 1965); Colonoscopy (2008); Cholecystectomy (2005); Breast surgery (Left, 1965); Eye surgery (Bilateral); Kyphoplasty (N/A, 12/17/2016); VISCERAL ANGIOGRAPHY (N/A, 08/15/2017); VISCERAL ARTERY INTERVENTION (N/A, 08/15/2017); VISCERAL ANGIOGRAPHY (N/A, 10/05/2017); and Lumbar laminectomy (05/09/2017).   Her family history includes Alcohol abuse in her sister; Cancer in her brother and brother; Diabetes in her brother, maternal grandmother, and sister; Heart disease in her maternal grandmother and mother; Heart disease (age of onset: 2) in her brother; Hypertension in her father; Kidney disease in her sister; Stroke in her brother.She reports that she has never smoked. She has never used smokeless tobacco. She reports that she does not drink alcohol or use drugs.  Outpatient Medications Prior to Visit  Medication Sig Dispense Refill  . acetaminophen (TYLENOL) 500 MG tablet Take 500 mg by mouth every 6 (six) hours as needed for mild pain.    . Ascorbic Acid (VITAMIN C CR) 500 MG CPCR Take 500 mg by mouth daily.    [name] atorvastatin (LIPITOR) 10 MG tablet Take 1 tablet (10 mg total) by mouth every evening. 30 tablet 6  . Calcium Carbonate-Vitamin D 600-200 MG-UNIT CAPS Take by mouth.    . Cholecalciferol (VITAMIN D3) 3000  UNITS TABS Take 3,000 Units by mouth 2 (two) times daily.     [name] CINNAMON PO Take 1,000 mg by mouth daily.     . clotrimazole-betamethasone (LOTRISONE) cream Apply topically 2 (two) times daily. 30 g 1  . co-enzyme Q-10 30 MG capsule Take 30 mg by mouth daily.     . fluticasone (FLONASE) 50 MCG/ACT nasal spray  Place 1 spray into both nostrils daily.    . Ginkgo Biloba (GNP GINGKO BILOBA EXTRACT PO) Take 1 tablet by mouth daily.    . Glucosamine Sulfate 500 MG CAPS Take 500 mg by mouth daily.    . Lactobacillus (PROBIOTIC ACIDOPHILUS PO) Take 1 capsule by mouth daily.     [name] loratadine (CLARITIN) 10 MG tablet Take 10 mg by mouth daily as needed for allergies.    . Magnesium 400 MG CAPS Take 400 mg by mouth daily.    . Multiple Vitamin (MULTIVITAMIN) capsule Take 1 capsule by mouth daily.    [name] neomycin-bacitracin-polymyxin (NEOSPORIN) [phone] ointment Apply 1 application topically daily as needed (itching).     . Omega-3 Fatty Acids (OMEGA 3 PO) Take 520 mg by mouth daily.     . pantoprazole (PROTONIX) 40 MG tablet Take by mouth.    . Pramoxine-HC (HYDROCORTISONE ACE-PRAMOXINE) 2.5-1 % CREA Apply topically as needed.     . sodium chloride (OCEAN) 0.65 % SOLN nasal spray Place 1 spray into both nostrils every 4 (four) hours as needed for congestion.     . Turmeric 450 MG CAPS Take 450 Units by mouth.    . vitamin B-12 (CYANOCOBALAMIN) 1000 MCG tablet Take 1,000 mcg by mouth daily.    . diclofenac sodium (VOLTAREN) 1 % GEL     . erythromycin ophthalmic ointment APPLY A SMALL AMOUNT IN LEFT EYE NIGHTLY    . ketotifen (ZADITOR) 0.025 % ophthalmic solution Place 1 drop into both eyes 2 (two) times daily.     [name] trimethoprim-polymyxin b (POLYTRIM) ophthalmic solution INSTILL 1 DROP INTO LEFT EYE THREE TIMES DAILY FOR 7 DAYS     No facility-administered medications prior to visit.     Review of Systems:  Patient denies headache, fevers, malaise, unintentional weight loss, skin rash, eye pain, sinus congestion and sinus pain, sore throat, dysphagia,  hemoptysis , cough, dyspnea, wheezing, chest pain, palpitations, orthopnea, edema, abdominal pain, nausea, melena, diarrhea, constipation, flank pain, dysuria, hematuria, urinary  Frequency, nocturia, numbness, tingling, seizures,  Focal weakness, Loss of  consciousness,  Tremor, insomnia, depression, anxiety, and suicidal ideation.     Objective:  BP 128/64 (BP Location: Left Arm, Patient Position: Sitting, Cuff Size: Normal)   Pulse 78   Temp (!) 97.4 F (36.3 C) (Temporal)   Resp 15   Ht 5' (1.524 m)   Wt 102 lb (46.3 kg)   SpO2 96%   BMI 19.92 kg/m   Physical Exam:   Assessment & Plan:   Problem List Items Addressed This Visit      Unprioritized   Abdominal pain   Relevant Orders   DG Thoracic Spine W/Swimmers (Completed)   Hyperlipidemia    Managed with atorvastatin given PAD and CAD   Lab Results  Component Value Date   CHOL 158 01/01/2019   HDL 77 01/01/2019   LDLCALC 63 01/01/2019   TRIG 96 01/01/2019   CHOLHDL 2.1 01/01/2019         Chronic thoracic spine pain    Secondary to degenerative changes, remote fractures of T11 and L1 ,  and spondylosis raising the question of referred pain as the source of her positional abd pain .  MRI of t horacic spine offered.       Pancreatic cyst    Not noted on CT abd pelvis Sept 2020       Osteoporosis, postmenopausal    Currently not treated despite history of vertebral fractures.  Prior intolerances to Evista and several bisphosphonates  And calcitonin.  Will discuss Prolia at next visit.  Continue calcium and vitamin D supplementation        Other Visit Diagnoses    Chronic bilateral thoracic back pain    -  Primary   Relevant Orders   DG Thoracic Spine W/Swimmers (Completed)   Need for immunization against influenza       Relevant Orders   Flu Vaccine QUAD High Dose(Fluad) (Completed)      I am having Ellie Lunch maintain her Vitamin D3, multivitamin, Omega-3 Fatty Acids (OMEGA 3 PO), Vitamin C CR, Lactobacillus (PROBIOTIC ACIDOPHILUS PO), Glucosamine Sulfate, vitamin B-12, CINNAMON PO, Turmeric, co-enzyme Q-10, acetaminophen, Magnesium, ketotifen, sodium chloride, neomycin-bacitracin-polymyxin, loratadine, Ginkgo Biloba (GNP GINGKO BILOBA EXTRACT PO),  Calcium Carbonate-Vitamin D, Hydrocortisone Ace-Pramoxine, fluticasone, clotrimazole-betamethasone, pantoprazole, trimethoprim-polymyxin b, erythromycin, atorvastatin, and diclofenac sodium.  No orders of the defined types were placed in this encounter.  A total of 30 minutes was spent with patient more than half of which was spent in counseling patient on the above mentioned issues , reviewing and explaining recent labs and imaging studies done, and coordination of care.  There are no discontinued medications.  Follow-up: Return in about 6 months (around 02/04/2020).   Crecencio Mc, MD

## 2019-08-07 NOTE — Assessment & Plan Note (Signed)
Secondary to degenerative changes, remote fractures of T11 and L1 , and spondylosis raising the question of referred pain as the source of her positional abd pain .  MRI of t horacic spine offered.

## 2019-08-07 NOTE — Assessment & Plan Note (Signed)
Not noted on CT abd pelvis Sept 2020

## 2019-08-07 NOTE — Assessment & Plan Note (Addendum)
Currently not treated despite history of vertebral fractures.  Prior intolerances to Evista and several bisphosphonates  And calcitonin.  Will discuss Prolia at next visit.  Continue calcium and vitamin D supplementation

## 2019-08-07 NOTE — Patient Instructions (Addendum)
Please try sleeping on a firmer mattress for a few weeks to see if the abdominal pain improves (it may be referred from your thoracic spine)  I have ordered x rays of your thoracic spine to evaluate the spinal column and vertebrae    Urinary Incontinence  Urinary incontinence refers to a condition in which a person is unable to control where and when to pass urine. A person with this condition will urinate when he or she does not mean to (involuntarily). What are the causes? This condition may be caused by:  Medicines.  Infections.  Constipation.  Overactive bladder muscles.  Weak bladder muscles.  Weak pelvic floor muscles. These muscles provide support for the bladder, intestine, and, in women, the uterus.  Enlarged prostate in men. The prostate is a gland near the bladder. When it gets too big, it can pinch the urethra. With the urethra blocked, the bladder can weaken and lose the ability to empty properly.  Surgery.  Emotional factors, such as anxiety, stress, or post-traumatic stress disorder (PTSD).  Pelvic organ prolapse. This happens in women when organs shift out of place and into the vagina. This shift can prevent the bladder and urethra from working properly. What increases the risk? The following factors may make you more likely to develop this condition:  Older age.  Obesity and physical inactivity.  Pregnancy and childbirth.  Menopause.  Diseases that affect the nerves or spinal cord (neurological diseases).  Long-term (chronic) coughing. This can increase pressure on the bladder and pelvic floor muscles. What are the signs or symptoms? Symptoms may vary depending on the type of urinary incontinence you have. They include:  A sudden urge to urinate, but passing urine involuntarily before you can get to a bathroom (urge incontinence).  Suddenly passing urine with any activity that forces urine to pass, such as coughing, laughing, exercise, or sneezing  (stress incontinence).  Needing to urinate often, but urinating only a small amount, or constantly dribbling urine (overflow incontinence).  Urinating because you cannot get to the bathroom in time due to a physical disability, such as arthritis or injury, or communication and thinking problems, such as Alzheimer disease (functional incontinence). How is this diagnosed? This condition may be diagnosed based on:  Your medical history.  A physical exam.  Tests, such as: ? Urine tests. ? X-rays of your kidney and bladder. ? Ultrasound. ? CT scan. ? Cystoscopy. In this procedure, a health care provider inserts a tube with a light and camera (cystoscope) through the urethra and into the bladder in order to check for problems. ? Urodynamic testing. These tests assess how well the bladder, urethra, and sphincter can store and release urine. There are different types of urodynamic tests, and they vary depending on what the test is measuring. To help diagnose your condition, your health care provider may recommend that you keep a log of when you urinate and how much you urinate. How is this treated? Treatment for this condition depends on the type of incontinence that you have and its cause. Treatment may include:  Lifestyle changes, such as: ? Quitting smoking. ? Maintaining a healthy weight. ? Staying active. Try to get 150 minutes of moderate-intensity exercise every week. Ask your health care provider which activities are safe for you. ? Eating a healthy diet.  Avoid high-fat foods, like fried foods.  Avoid refined carbohydrates like white bread and white rice.  Limit how much alcohol and caffeine you drink.  Increase your fiber intake. Foods  such as fresh fruits, vegetables, beans, and whole grains are healthy sources of fiber.  Pelvic floor muscle exercises.  Bladder training, such as lengthening the amount of time between bathroom breaks, or using the bathroom at regular  intervals.  Using techniques to suppress bladder urges. This can include distraction techniques or controlled breathing exercises.  Medicines to relax the bladder muscles and prevent bladder spasms.  Medicines to help slow or prevent the growth of a man's prostate.  Botox injections. These can help relax the bladder muscles.  Using pulses of electricity to help change bladder reflexes (electrical nerve stimulation).  For women, using a medical device to prevent urine leaks. This is a small, tampon-like, disposable device that is inserted into the urethra.  Injecting collagen or carbon beads (bulking agents) into the urinary sphincter. These can help thicken tissue and close the bladder opening.  Surgery. Follow these instructions at home: Lifestyle  Limit alcohol and caffeine. These can fill your bladder quickly and irritate it.  Keep yourself clean to help prevent odors and skin damage. Ask your doctor about special skin creams and cleansers that can protect the skin from urine.  Consider wearing pads or adult diapers. Make sure to change them regularly, and always change them right after experiencing incontinence. General instructions  Take over-the-counter and prescription medicines only as told by your health care provider.  Use the bathroom about every 3-4 hours, even if you do not feel the need to urinate. Try to empty your bladder completely every time. After urinating, wait a minute. Then try to urinate again.  Make sure you are in a relaxed position while urinating.  If your incontinence is caused by nerve problems, keep a log of the medicines you take and the times you go to the bathroom.  Keep all follow-up visits as told by your health care provider. This is important. Contact a health care provider if:  You have pain that gets worse.  Your incontinence gets worse. Get help right away if:  You have a fever or chills.  You are unable to urinate.  You have  redness in your groin area or down your legs. Summary  Urinary incontinence refers to a condition in which a person is unable to control where and when to pass urine.  This condition may be caused by medicines, infection, weak bladder muscles, weak pelvic floor muscles, enlargement of the prostate (in men), or surgery.  The following factors increase your risk for developing this condition: older age, obesity, pregnancy and childbirth, menopause, neurological diseases, and chronic coughing.  There are several types of urinary incontinence. They include urge incontinence, stress incontinence, overflow incontinence, and functional incontinence.  This condition is usually treated first with lifestyle and behavioral changes, such as quitting smoking, eating a healthier diet, and doing regular pelvic floor exercises. Other treatment options include medicines, bulking agents, medical devices, electrical nerve stimulation, or surgery. This information is not intended to replace advice given to you by your health care provider. Make sure you discuss any questions you have with your health care provider. Document Released: 12/02/2004 Document Revised: 11/04/2017 Document Reviewed: 02/03/2017 Elsevier Patient Education  2020 [employer].

## 2019-08-14 ENCOUNTER — Other Ambulatory Visit: Payer: Self-pay | Admitting: Internal Medicine

## 2019-08-14 DIAGNOSIS — M4854XS Collapsed vertebra, not elsewhere classified, thoracic region, sequela of fracture: Secondary | ICD-10-CM

## 2019-08-14 DIAGNOSIS — M5134 Other intervertebral disc degeneration, thoracic region: Secondary | ICD-10-CM

## 2019-08-16 ENCOUNTER — Telehealth: Payer: Self-pay

## 2019-08-16 NOTE — Telephone Encounter (Signed)
Spoke with pt to let her know that the MRI has been ordered and that someone will be giving her a call to schedule once it is approved through her insurance. Pt gave a verbal understanding.

## 2019-08-16 NOTE — Telephone Encounter (Signed)
LMTCB

## 2019-08-16 NOTE — Telephone Encounter (Signed)
Copied from [place] [id number]. Topic: General - Other >> Aug 16, 2019  9:39 AM Keene Breath wrote: Reason for CRM: Patient called to speak with the doctor's nurse regarding an MRI of her back.  The patient stated that she would like the MRI.  Please advise and call patient back to confirm.  CB# [phone]

## 2019-09-08 ENCOUNTER — Ambulatory Visit: Payer: Medicare Other

## 2019-09-13 ENCOUNTER — Ambulatory Visit
Admission: RE | Admit: 2019-09-13 | Discharge: 2019-09-13 | Disposition: A | Discharge status: Home / Self Care | Payer: Medicare Other | Source: Ambulatory Visit | Attending: Internal Medicine | Admitting: Internal Medicine

## 2019-09-13 ENCOUNTER — Other Ambulatory Visit: Payer: Self-pay

## 2019-09-13 DIAGNOSIS — M5134 Other intervertebral disc degeneration, thoracic region: Secondary | ICD-10-CM | POA: Diagnosis not present

## 2019-09-13 DIAGNOSIS — M4854XS Collapsed vertebra, not elsewhere classified, thoracic region, sequela of fracture: Secondary | ICD-10-CM | POA: Insufficient documentation

## 2019-09-13 DIAGNOSIS — M546 Pain in thoracic spine: Secondary | ICD-10-CM | POA: Diagnosis not present

## 2019-09-17 ENCOUNTER — Telehealth: Payer: Self-pay

## 2019-09-17 NOTE — Telephone Encounter (Signed)
Copied from [place] [id number]. Topic: General - Other >> Sep 17, 2019 10:06 AM Celene Kras A wrote: Reason for CRM: Pt called stating she received a call from [facility]. Pt is requesting a call back and if she cannot be reached she has asked to leave a vm. Please advise.

## 2019-09-17 NOTE — Telephone Encounter (Signed)
Spoke to patient see MRI results

## 2019-09-17 NOTE — Telephone Encounter (Signed)
Copied from [place] [id number]. Topic: General - Other >> Sep 17, 2019 10:06 AM Celene Kras A wrote: Reason for CRM: Pt called stating she received a call from [facility]. Pt is requesting a call back and if she cannot be reached she has asked to leave a vm. Please advise.

## 2019-09-19 ENCOUNTER — Other Ambulatory Visit: Payer: Self-pay

## 2019-09-19 ENCOUNTER — Encounter: Payer: Self-pay | Admitting: Internal Medicine

## 2019-09-19 ENCOUNTER — Ambulatory Visit (INDEPENDENT_AMBULATORY_CARE_PROVIDER_SITE_OTHER): Payer: Medicare Other | Admitting: Internal Medicine

## 2019-09-19 VITALS — Ht 60.0 in | Wt 102.0 lb

## 2019-09-19 DIAGNOSIS — G8929 Other chronic pain: Secondary | ICD-10-CM | POA: Diagnosis not present

## 2019-09-19 DIAGNOSIS — R748 Abnormal levels of other serum enzymes: Secondary | ICD-10-CM | POA: Diagnosis not present

## 2019-09-19 DIAGNOSIS — E538 Deficiency of other specified B group vitamins: Secondary | ICD-10-CM

## 2019-09-19 DIAGNOSIS — R1031 Right lower quadrant pain: Secondary | ICD-10-CM | POA: Diagnosis not present

## 2019-09-19 DIAGNOSIS — M546 Pain in thoracic spine: Secondary | ICD-10-CM

## 2019-09-19 NOTE — Patient Instructions (Signed)
  Your MRI confirms that that  degenerative changes at multiple levels have caused bulging disks and thickened ligaments  may be periodically pushing on nerve roots as they come out from the spinal cord.  This can cause pain that radiates to either side of the abdomen .  Please let me know when you pain is constnat or severe enough to consider having an epidural steroid injection (s shot in your spine) . This is done by  Dr. Loistine Chance.  For now I would resume the exercises that you were given during your sessions with physical therapy   continue pantoprazole to manage your reflux

## 2019-09-19 NOTE — Progress Notes (Signed)
Telephone Note  This visit type was conducted due to national recommendations for restrictions regarding the COVID-19 pandemic (e.g. social distancing).  This format is felt to be most appropriate for this patient at this time.  All issues noted in this document were discussed and addressed.  No physical exam was performed (except for noted visual exam findings with Video Visits).   I connected with@ on 09/19/19 at  8:30 AM EST by telephone and verified that I am speaking with the correct person using two identifiers. Location patient: home Location provider: work or home office Persons participating in the virtual visit: patient, provider  I discussed the limitations, risks, security and privacy concerns of performing an evaluation and management service by telephone and the availability of in person appointments. I also discussed with the patient that there may be a patient responsible charge related to this service. The patient expressed understanding and agreed to proceed.  Reason for visit: MRI results (thoracic spine)  85 yr old female with history of PAD s/p celiac artery stent,  Osteoporosis with kyphosis secondary to multiple  vertebral fractures T11, L2) s/p kyphoplasty in 2018 presents for discussion of MRI thoracic spine done to rule out referred pain from DDD  as a cause of her  Chronic right sided abdominal pain of uncertain etiology .  A CT of abdomen and pelvis was done last month and was negative for pathology.  She recalls that the pain from her fractures resolved with kypoplasty and the pain she is currently having started after her celiac artery  stent was placed.  Her pain is not severe and she is not requiring or seeking analgesics at this time.   MRI noted old fractures as well as right central disk protrusion at T7-8 and LF hypertrophy at T9-10  She has been taking protonix for GERD.  Takes B12 gummy bear a MVI with D     No results found for: VITAMINB12  ROS: See  pertinent positives and negatives per HPI.  Past Medical History:  Diagnosis Date  . Allergy   . Arthritis   . Atherosclerosis   . Atherosclerosis of abdominal aorta ([place])   . Chronic kidney disease    stage 2  . CKD (chronic kidney disease) stage 2, GFR 60-89 ml/min   . Collagen vascular disease ([place])   . Diverticulosis 2008  . GERD (gastroesophageal reflux disease)   . Heart murmur   . Hyperlipidemia   . Lumbar spinal stenosis   . Osteoporosis   . Ovarian lump   . RA (rheumatoid arthritis) ([place])   . Raynaud's disease   . Rheumatoid arthritis ([place])   . Spinal stenosis     Past Surgical History:  Procedure Laterality Date  . BREAST SURGERY Left 1965   Benign biopsy  . CHOLECYSTECTOMY  2005   [place]  . COLONOSCOPY  2008   Dr. Donnella Sham  . EXCISION OF BREAST BIOPSY Left 1965   benign  . EYE SURGERY Bilateral    Cataract Extraction with IOL  . KYPHOPLASTY N/A 12/17/2016   Procedure: KYPHOPLASTY;  Surgeon: Hessie Knows, MD;  Location: [facility] ORS;  Service: Orthopedics;  Laterality: N/A;  l4  . LUMBAR LAMINECTOMY  05/09/2017   L2-L5  . VISCERAL ANGIOGRAPHY N/A 08/15/2017   Procedure: VISCERAL ANGIOGRAPHY;  Surgeon: Algernon Huxley, MD;  Location: [place] [facility];  Service: Cardiovascular;  Laterality: N/A;  . VISCERAL ANGIOGRAPHY N/A 10/05/2017   Procedure: VISCERAL ANGIOGRAPHY;  Surgeon: Algernon Huxley, MD;  Location:  [place] [facility];  Service: Cardiovascular;  Laterality: N/A;  . VISCERAL ARTERY INTERVENTION N/A 08/15/2017   Procedure: VISCERAL ARTERY INTERVENTION;  Surgeon: Algernon Huxley, MD;  Location: [place] [facility];  Service: Cardiovascular;  Laterality: N/A;    Family History  Problem Relation Age of Onset  . Heart disease Mother   . Hypertension Father   . Heart disease Brother 73       heart attack  . Cancer Brother        bladder  . Diabetes Sister   . Alcohol abuse Sister   . Kidney disease Sister   . Diabetes Brother   . Stroke Brother   .  Diabetes Maternal Grandmother   . Heart disease Maternal Grandmother   . Cancer Brother        lung    SOCIAL HX:  reports that she has never smoked. She has never used smokeless tobacco. She reports that she does not drink alcohol or use drugs.   Current Outpatient Medications:  .  acetaminophen (TYLENOL) 500 MG tablet, Take 500 mg by mouth every 6 (six) hours as needed for mild pain., Disp: , Rfl:  .  Ascorbic Acid (VITAMIN C CR) 500 MG CPCR, Take 500 mg by mouth daily., Disp: , Rfl:  .  atorvastatin (LIPITOR) 10 MG tablet, Take 1 tablet (10 mg total) by mouth every evening., Disp: 30 tablet, Rfl: 6 .  Calcium Carbonate-Vitamin D 600-200 MG-UNIT CAPS, Take by mouth., Disp: , Rfl:  .  Cholecalciferol (VITAMIN D3) 3000 UNITS TABS, Take 3,000 Units by mouth 2 (two) times daily. , Disp: , Rfl:  .  CINNAMON PO, Take 1,000 mg by mouth daily. , Disp: , Rfl:  .  clotrimazole-betamethasone (LOTRISONE) cream, Apply topically 2 (two) times daily., Disp: 30 g, Rfl: 1 .  co-enzyme Q-10 30 MG capsule, Take 30 mg by mouth daily. , Disp: , Rfl:  .  diclofenac sodium (VOLTAREN) 1 % GEL, , Disp: , Rfl:  .  erythromycin ophthalmic ointment, APPLY A SMALL AMOUNT IN LEFT EYE NIGHTLY, Disp: , Rfl:  .  fluticasone (FLONASE) 50 MCG/ACT nasal spray, Place 1 spray into both nostrils daily., Disp: , Rfl:  .  Ginkgo Biloba (GNP GINGKO BILOBA EXTRACT PO), Take 1 tablet by mouth daily., Disp: , Rfl:  .  Glucosamine Sulfate 500 MG CAPS, Take 500 mg by mouth daily., Disp: , Rfl:  .  ketotifen (ZADITOR) 0.025 % ophthalmic solution, Place 1 drop into both eyes 2 (two) times daily. , Disp: , Rfl:  .  Lactobacillus (PROBIOTIC ACIDOPHILUS PO), Take 1 capsule by mouth daily. , Disp: , Rfl:  .  loratadine (CLARITIN) 10 MG tablet, Take 10 mg by mouth daily as needed for allergies., Disp: , Rfl:  .  Magnesium 400 MG CAPS, Take 400 mg by mouth daily., Disp: , Rfl:  .  Multiple Vitamin (MULTIVITAMIN) capsule, Take 1 capsule by  mouth daily., Disp: , Rfl:  .  neomycin-bacitracin-polymyxin (NEOSPORIN) [phone] ointment, Apply 1 application topically daily as needed (itching). , Disp: , Rfl:  .  Omega-3 Fatty Acids (OMEGA 3 PO), Take 520 mg by mouth daily. , Disp: , Rfl:  .  pantoprazole (PROTONIX) 40 MG tablet, Take by mouth., Disp: , Rfl:  .  Pramoxine-HC (HYDROCORTISONE ACE-PRAMOXINE) 2.5-1 % CREA, Apply topically as needed. , Disp: , Rfl:  .  sodium chloride (OCEAN) 0.65 % SOLN nasal spray, Place 1 spray into both nostrils every 4 (four) hours as needed  for congestion. , Disp: , Rfl:  .  trimethoprim-polymyxin b (POLYTRIM) ophthalmic solution, INSTILL 1 DROP INTO LEFT EYE THREE TIMES DAILY FOR 7 DAYS, Disp: , Rfl:  .  Turmeric 450 MG CAPS, Take 450 Units by mouth., Disp: , Rfl:  .  vitamin B-12 (CYANOCOBALAMIN) 1000 MCG tablet, Take 1,000 mcg by mouth daily., Disp: , Rfl:   EXAM:   General impression: alert, cooperative and articulate.  No signs of being in distress  Lungs: speech is fluent sentence length suggests that patient is not short of breath and not punctuated by cough, sneezing or sniffing. [name]   Psych: affect normal.  speech is articulate and non pressured .  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  MRI of thoracic spine supports this .  She is not in severe pain and does not want referral for ESI or analgesics at this time.   Abdominal pain No source found thus far other than mild gastritis which has improved pantoprazole. Most of her pain appears to be referred from her thoracic spine .  She is not requiring narcotics or even OTC analgesics at this time.     I discussed the assessment and treatment plan with the patient. The patient was provided an opportunity to ask questions and all were answered. The patient agreed with the plan and demonstrated an understanding of the instructions.   The patient was advised to call back or seek an in-person evaluation if the symptoms worsen or if the condition fails to improve as anticipated.  I provided  25 minutes of non-face-to-face time during this encounter reviewing patient's current problems and post surgeries.  Providing counseling on the above mentioned problems , and coordination  of care .  Crecencio Mc, MD

## 2019-09-20 ENCOUNTER — Other Ambulatory Visit: Payer: Self-pay

## 2019-09-20 ENCOUNTER — Other Ambulatory Visit (INDEPENDENT_AMBULATORY_CARE_PROVIDER_SITE_OTHER): Payer: Medicare Other

## 2019-09-20 DIAGNOSIS — R748 Abnormal levels of other serum enzymes: Secondary | ICD-10-CM

## 2019-09-20 DIAGNOSIS — E538 Deficiency of other specified B group vitamins: Secondary | ICD-10-CM

## 2019-09-20 NOTE — Assessment & Plan Note (Signed)
Secondary to degenerative changes, remote fractures of T11 and L1 , and spondylosis raising the question of referred pain as the source of her positional abd pain .  MRI of thoracic spine supports this .  She is not in severe pain and does not want referral for ESI or analgesics at this time.

## 2019-09-20 NOTE — Assessment & Plan Note (Signed)
No source found thus far other than mild gastritis which has improved pantoprazole. Most of her pain appears to be referred from her thoracic spine . She is not requiring narcotics or even OTC analgesics at this time.

## 2019-09-21 LAB — COMPREHENSIVE METABOLIC PANEL
ALT: 21 U/L (ref 0–35)
AST: 40 U/L — ABNORMAL HIGH (ref 0–37)
Albumin: 4.2 g/dL (ref 3.5–5.2)
Alkaline Phosphatase: 61 U/L (ref 39–117)
BUN: 15 mg/dL (ref 6–23)
CO2: 31 mEq/L (ref 19–32)
Calcium: 9.7 mg/dL (ref 8.4–10.5)
Chloride: 100 mEq/L (ref 96–112)
Creatinine, Ser: 0.68 mg/dL (ref 0.40–1.20)
GFR: 81.3 mL/min (ref >60.00)
Glucose, Bld: 132 mg/dL — ABNORMAL HIGH (ref 70–99)
Potassium: 4.2 mEq/L (ref 3.5–5.1)
Sodium: 139 mEq/L (ref 135–145)
Total Bilirubin: 0.3 mg/dL (ref 0.2–1.2)
Total Protein: 6.6 g/dL (ref 6.0–8.3)

## 2019-09-21 LAB — VITAMIN B12: Vitamin B-12: 1381 pg/mL — ABNORMAL HIGH (ref 211–911)

## 2019-09-23 NOTE — Addendum Note (Signed)
Addended by: Crecencio Mc on: 09/23/2019 04:55 PM   Modules accepted: Orders

## 2019-10-08 ENCOUNTER — Other Ambulatory Visit: Payer: Self-pay

## 2019-10-10 ENCOUNTER — Other Ambulatory Visit: Payer: Self-pay

## 2019-10-10 ENCOUNTER — Other Ambulatory Visit (INDEPENDENT_AMBULATORY_CARE_PROVIDER_SITE_OTHER): Payer: Medicare Other

## 2019-10-10 DIAGNOSIS — R748 Abnormal levels of other serum enzymes: Secondary | ICD-10-CM | POA: Diagnosis not present

## 2019-10-10 LAB — HEPATIC FUNCTION PANEL
ALT: 23 U/L (ref 0–35)
AST: 44 U/L — ABNORMAL HIGH (ref 0–37)
Albumin: 4.2 g/dL (ref 3.5–5.2)
Alkaline Phosphatase: 63 U/L (ref 39–117)
Bilirubin, Direct: 0.1 mg/dL (ref 0.0–0.3)
Total Bilirubin: 0.5 mg/dL (ref 0.2–1.2)
Total Protein: 6.6 g/dL (ref 6.0–8.3)

## 2019-10-11 ENCOUNTER — Other Ambulatory Visit: Payer: Self-pay | Admitting: Internal Medicine

## 2019-10-11 DIAGNOSIS — R7401 Elevation of levels of liver transaminase levels: Secondary | ICD-10-CM | POA: Insufficient documentation

## 2019-10-11 MED ORDER — PANTOPRAZOLE SODIUM 40 MG PO TBEC: 40.0000 mg | DELAYED_RELEASE_TABLET | Freq: Every day | ORAL | 1 refills | Status: DC

## 2019-10-11 NOTE — Assessment & Plan Note (Signed)
Suspending statin.  No evidence of NASH by sept 2020 CT scan.  Repeat panel in 4 weeks with add'l labs to rule out autoimmune causes

## 2019-10-11 NOTE — Telephone Encounter (Signed)
This is a historical medication and has never been filled by any one in our office.

## 2019-10-11 NOTE — Telephone Encounter (Signed)
Medication Refill - Medication: pantoprazole (PROTONIX) 40 MG tablet  Pt states this rx was from historical provider that is no longer providing care for pt, but Dr Derrel Nip stated to pt she would refill. Please advise.  Preferred Pharmacy:  [facility] [address] [place] - [place] [phone] (Phone) [phone] (Fax)     Pt was advised that RX refills may take up to 3 business days. We ask that you follow-up with your pharmacy.

## 2019-10-11 NOTE — Telephone Encounter (Signed)
Requested medication (s) are due for refill today: yes  Requested medication (s) are on the active medication list: yes  Last refill:  ?  Future visit scheduled: no  Notes to clinic:  Historical provider    Requested Prescriptions  Pending Prescriptions Disp Refills   pantoprazole (PROTONIX) 40 MG tablet       Sig: Take by mouth.     Gastroenterology: Proton Pump Inhibitors Passed - 10/11/2019  9:22 AM      Passed - Valid encounter within last 12 months    Recent Outpatient Visits          3 weeks ago Elevated liver enzymes   [facility] [place] Crecencio Mc, MD   2 months ago Chronic bilateral thoracic back pain   [place] Crecencio Mc, MD   5 months ago RUQ pain   [place], FNP   7 months ago Laceration of left hand without foreign body, initial encounter   [place], NP   8 months ago RUQ pain   [facility] [place] Sizer, MD

## 2019-11-07 ENCOUNTER — Other Ambulatory Visit: Payer: Self-pay

## 2019-11-13 ENCOUNTER — Other Ambulatory Visit (INDEPENDENT_AMBULATORY_CARE_PROVIDER_SITE_OTHER): Payer: Medicare Other

## 2019-11-13 ENCOUNTER — Other Ambulatory Visit: Payer: Self-pay

## 2019-11-13 DIAGNOSIS — R7401 Elevation of levels of liver transaminase levels: Secondary | ICD-10-CM

## 2019-11-13 LAB — HEPATIC FUNCTION PANEL
ALT: 22 U/L (ref 0–35)
AST: 41 U/L — ABNORMAL HIGH (ref 0–37)
Albumin: 4.3 g/dL (ref 3.5–5.2)
Alkaline Phosphatase: 71 U/L (ref 39–117)
Bilirubin, Direct: 0.1 mg/dL (ref 0.0–0.3)
Total Bilirubin: 0.4 mg/dL (ref 0.2–1.2)
Total Protein: 7.3 g/dL (ref 6.0–8.3)

## 2019-11-13 LAB — IBC + FERRITIN
Ferritin: 28.4 ng/mL (ref 10.0–291.0)
Iron: 102 ug/dL (ref 42–145)
Saturation Ratios: 26.1 % (ref 20.0–50.0)
Transferrin: 279 mg/dL (ref 212.0–360.0)

## 2019-11-16 LAB — HEPATITIS C ANTIBODY
Hepatitis C Ab: NONREACTIVE
SIGNAL TO CUT-OFF: 0.02 (ref <1.00)

## 2019-11-16 LAB — HEPATITIS B SURFACE ANTIGEN: Hepatitis B Surface Ag: NONREACTIVE

## 2019-11-16 LAB — ANTI-NUCLEAR AB-TITER (ANA TITER): ANA Titer 1: 1:80 {titer} — ABNORMAL HIGH

## 2019-11-16 LAB — ANA: Anti Nuclear Antibody (ANA): POSITIVE — AB

## 2019-11-16 LAB — HEPATITIS B CORE ANTIBODY, TOTAL: Hep B Core Total Ab: NONREACTIVE

## 2019-11-16 LAB — MITOCHONDRIAL ANTIBODIES: Mitochondrial M2 Ab, IgG: <=20 U

## 2019-11-16 LAB — HEPATITIS B SURFACE ANTIBODY,QUALITATIVE: Hep B S Ab: NONREACTIVE

## 2019-11-16 LAB — ANTI-SMITH ANTIBODY: ENA SM Ab Ser-aCnc: <1 AI

## 2019-11-16 NOTE — Progress Notes (Signed)
Additional liver tests were all normal except for the one suggesting an autoimmune cause (which was positive last year  as well ). If she has already seen Gastroenterology about this,  I recommend seeing rheumatology for a second opinion .

## 2019-12-21 ENCOUNTER — Other Ambulatory Visit: Payer: Self-pay | Admitting: Internal Medicine

## 2019-12-21 MED ORDER — ATORVASTATIN CALCIUM 10 MG PO TABS: 10.0000 mg | ORAL_TABLET | Freq: Every evening | ORAL | 6 refills | Status: DC

## 2019-12-21 NOTE — Telephone Encounter (Signed)
Refilled: 07/11/2019 by Dr. Lucky Cowboy Last OV: 09/19/2019 Next OV: not scheduled

## 2019-12-21 NOTE — Telephone Encounter (Signed)
Pt called and wanted to know if Dr. Derrel Nip would fill her atorvastatin (LIPITOR) 10 MG tablet  Dr. Lucky Cowboy fill rx last

## 2020-02-22 ENCOUNTER — Telehealth: Payer: Self-pay | Admitting: Internal Medicine

## 2020-02-22 NOTE — Telephone Encounter (Signed)
Patient did go to Urgent Care today and they think it is a broken blood vessel.

## 2020-02-22 NOTE — Telephone Encounter (Signed)
Pt is concerned she has a blood clot. She said her left ankle is swollen with shooting pains to the heel.

## 2020-02-22 NOTE — Telephone Encounter (Signed)
FYI

## 2020-02-22 NOTE — Telephone Encounter (Signed)
Spoke with pt and she stated that yesterday she was sitting at the breakfast table and noticed a pain that started at her ankle and radiated down into her foot and up into her calf. Pt stated that the ankle is red, not having any SOBr, but has felt weak since yesterday. Pt was advised to go to the ED to be evaluated, however pt did not want to go to the ED so she stated that she is going to go over to the [facility] walk-in to be evaluated. She stated that she is going to head on over there now.

## 2020-03-12 ENCOUNTER — Ambulatory Visit (INDEPENDENT_AMBULATORY_CARE_PROVIDER_SITE_OTHER): Payer: Medicare Other | Admitting: Internal Medicine

## 2020-03-12 ENCOUNTER — Ambulatory Visit (INDEPENDENT_AMBULATORY_CARE_PROVIDER_SITE_OTHER): Payer: Medicare Other

## 2020-03-12 ENCOUNTER — Encounter: Payer: Self-pay | Admitting: Internal Medicine

## 2020-03-12 ENCOUNTER — Other Ambulatory Visit: Payer: Self-pay

## 2020-03-12 VITALS — BP 140/80 | HR 72 | Temp 97.6°F | Resp 16 | Ht 62.0 in | Wt 102.2 lb

## 2020-03-12 DIAGNOSIS — R531 Weakness: Secondary | ICD-10-CM | POA: Diagnosis not present

## 2020-03-12 DIAGNOSIS — R0602 Shortness of breath: Secondary | ICD-10-CM

## 2020-03-12 DIAGNOSIS — R5383 Other fatigue: Secondary | ICD-10-CM | POA: Diagnosis not present

## 2020-03-12 DIAGNOSIS — R0789 Other chest pain: Secondary | ICD-10-CM

## 2020-03-12 DIAGNOSIS — E559 Vitamin D deficiency, unspecified: Secondary | ICD-10-CM

## 2020-03-12 NOTE — Progress Notes (Signed)
Subjective:  Patient ID: [name], female    DOB: 1927-09-04  Age: 86 y.o. MRN: [id number]  CC: The primary encounter diagnosis was Shortness of breath. Diagnoses of Weakness generalized, Vitamin D deficiency, Fatigue, unspecified type, Atypical chest pain, and Weakness were also pertinent to this visit.  HPI [name] presents for evaluation of chest/epigastric pain  Patient describes a burning in the center of chest that radiates to center of abdomen.the pain is episodic and lasts for several hours. Occurs both at rest and with exertion. At times when occurring at rest,  It is relieved by sitting up.,  But other times occurs with walking and relieved with rest. She also reports  New onset "weakness" which occurs after eating breakfast ,  Resolves with rest/napping .  Brought on with activity , not associated with chest pain .  No jaw pain,  nausea or diaphoresis.     She cannot differentiate between fatigue and weakness and states that the symptom Has been occurring for the last several months,  Becoming more frequent and bothersome after the [facility],  But have persisted for more than 4 weeks.   No weight change, appetite ok,  Bowels moving on a regular basis.  Remains active daily .   Had a fall in December while walking with daughters.  States that she tripped over a tree stump and fell directly  onto left shoulder , scraped her face . Did not have any focal back pain after the fall.     History of GERD, managed with prilosec. 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     Outpatient Medications Prior to Visit  Medication Sig Dispense Refill  . acetaminophen (TYLENOL) 500 MG tablet Take 500 mg by mouth every 6 (six) hours as needed for mild pain.    . Ascorbic Acid (VITAMIN C CR) 500 MG CPCR Take 500 mg by mouth daily.    [name] atorvastatin (LIPITOR) 10 MG tablet Take 1  tablet (10 mg total) by mouth every evening. 30 tablet 6  . Calcium Carbonate-Vitamin D 600-200 MG-UNIT CAPS Take by mouth.    . Cholecalciferol (VITAMIN D3) 3000 UNITS TABS Take 3,000 Units by mouth 2 (two) times daily.     [name] CINNAMON PO Take 1,000 mg by mouth daily.     . clotrimazole-betamethasone (LOTRISONE) cream Apply topically 2 (two) times daily. 30 g 1  . co-enzyme Q-10 30 MG capsule Take 30 mg by mouth daily.     . diclofenac sodium (VOLTAREN) 1 % GEL     . fluticasone (FLONASE) 50 MCG/ACT nasal spray Place 1 spray into both nostrils daily.    . Ginkgo Biloba (GNP GINGKO BILOBA EXTRACT PO) Take 1 tablet by mouth daily.    . Glucosamine Sulfate 500 MG CAPS Take 500 mg by mouth daily.    . Lactobacillus (PROBIOTIC ACIDOPHILUS PO) Take 1 capsule by mouth daily.     [name] loratadine (CLARITIN) 10 MG tablet Take 10 mg by mouth daily as needed for allergies.    . Magnesium 400 MG CAPS Take 400 mg by mouth daily.    . Multiple Vitamin (MULTIVITAMIN) capsule Take 1 capsule by mouth daily.    . Omega-3 Fatty Acids (OMEGA 3 PO) Take 520 mg by mouth daily.     . pantoprazole (PROTONIX) 40 MG tablet Take 1 tablet (40 mg total) by mouth daily. 90 tablet  1  . Pramoxine-HC (HYDROCORTISONE ACE-PRAMOXINE) 2.5-1 % CREA Apply topically as needed.     . sodium chloride (OCEAN) 0.65 % SOLN nasal spray Place 1 spray into both nostrils every 4 (four) hours as needed for congestion.     . Turmeric 450 MG CAPS Take 450 Units by mouth.    . vitamin B-12 (CYANOCOBALAMIN) 1000 MCG tablet Take 1,000 mcg by mouth daily.    [name] erythromycin ophthalmic ointment APPLY A SMALL AMOUNT IN LEFT EYE NIGHTLY    . ketotifen (ZADITOR) 0.025 % ophthalmic solution Place 1 drop into both eyes 2 (two) times daily.     [name] neomycin-bacitracin-polymyxin (NEOSPORIN) [phone] ointment Apply 1 application topically daily as needed (itching).     . trimethoprim-polymyxin b (POLYTRIM) ophthalmic solution INSTILL 1 DROP INTO LEFT EYE THREE  TIMES DAILY FOR 7 DAYS     No facility-administered medications prior to visit.    Review of Systems;  Patient denies headache, fevers, malaise, unintentional weight loss, skin rash, eye pain, sinus congestion and sinus pain, sore throat, dysphagia,  hemoptysis , cough, dyspnea, wheezing, chest pain, palpitations, orthopnea, edema, abdominal pain, nausea, melena, diarrhea, constipation, flank pain, dysuria, hematuria, urinary  Frequency, nocturia, numbness, tingling, seizures,  Focal weakness, Loss of consciousness,  Tremor, insomnia, depression, anxiety, and suicidal ideation.      Objective:  BP 140/80 (BP Location: Left Arm, Patient Position: Sitting, Cuff Size: Normal)   Pulse 72   Temp 97.6 F (36.4 C) (Temporal)   Resp 16   Ht 5\' 2"  (1.575 m)   Wt 102 lb 3.2 oz (46.4 kg)   SpO2 97%   BMI 18.69 kg/m   BP Readings from Last 3 Encounters:  03/12/20 140/80  08/07/19 128/64  06/13/19 (!) 218/97    Wt Readings from Last 3 Encounters:  03/12/20 102 lb 3.2 oz (46.4 kg)  09/19/19 102 lb (46.3 kg)  08/07/19 102 lb (46.3 kg)    General appearance: alert, cooperative and appears stated age Ears: normal TM's and external ear canals both ears Throat: lips, mucosa, and tongue normal; teeth and gums normal Neck: no adenopathy, no carotid bruit, supple, symmetrical, trachea midline and thyroid not enlarged, symmetric, no tenderness/mass/nodules Back: slightly kyphotic , symmetric, no curvature. ROM normal. No CVA tenderness. Lungs: clear to auscultation bilaterally.  No chest wall or sternal tenderness Heart: regular rate and rhythm, S1, S2 normal, no murmur, click, rub or gallop Abdomen: soft, non-tender; bowel sounds normal; no masses,  no organomegaly Pulses: 2+ and symmetric Skin: Skin color, texture, turgor normal. No rashes or lesions Lymph nodes: Cervical, supraclavicular, and axillary nodes normal. Neuro: CNs 2-12 intact. DTRs 2+/4 in biceps, brachioradialis, patellars and  achilles. Muscle strength 5/5 in upper and lower exremities. Fine resting tremor bilaterally both hands cerebellar function normal. Romberg negative.  No pronator drift.   Gait normal.    Lab Results  Component Value Date   HGBA1C 6.1 (H) 05/14/2019   HGBA1C 6.2 (H) 01/01/2019   HGBA1C 6.1 (H) 03/14/2018    Lab Results  Component Value Date   CREATININE 0.68 09/20/2019   CREATININE 0.60 07/24/2019   CREATININE 0.62 01/26/2019    Lab Results  Component Value Date   WBC 5.8 03/12/2020   HGB 13.1 03/12/2020   HCT 39.1 03/12/2020   PLT 200.0 03/12/2020   GLUCOSE 132 (H) 09/20/2019   CHOL 158 01/01/2019   TRIG 96 01/01/2019   HDL 77 01/01/2019   LDLCALC 63 01/01/2019   ALT 22 11/13/2019  AST 41 (H) 11/13/2019   NA 139 09/20/2019   K 4.2 09/20/2019   CL 100 09/20/2019   CREATININE 0.68 09/20/2019   BUN 15 09/20/2019   CO2 31 09/20/2019   TSH 2.22 03/12/2020   HGBA1C 6.1 (H) 05/14/2019    MR Thoracic Spine Wo Contrast  Result Date: 09/14/2019 CLINICAL DATA:  Other intervertebral disc degeneration, thoracic region. Non traumatic compression fracture of T11 vertebra, sequela. Mid back pain radiates to abdomen, abnormal x-rays, mid back/T-spine pain. Additional history provided: Right-sided pain, mostly at waist level but occurring anywhere from under right breast to right groin, symptoms 2 years intermittent. EXAM: MRI THORACIC SPINE WITHOUT CONTRAST TECHNIQUE: Multiplanar, multisequence MR imaging of the thoracic spine was performed. No intravenous contrast was administered. COMPARISON:  Thoracic spine radiographs 08/07/2019, MRI of the thoracic spine 09/17/2015 FINDINGS: Alignment: Exaggerated thoracic kyphosis. Minimal bony retropulsion at the level of the T11 superior endplate. Unchanged L2 bony retropulsion greatest at the level of the superior endplate, 5 mm. Vertebrae: Redemonstrated mild T11 compression fracture without progressive height loss. Redemonstrated moderate/severe  L2 compression fracture without progressive height loss. No interval or acute compression fracture is demonstrated. Multilevel degenerative endplate irregularity with small Schmorl nodes. Cord: No spinal cord signal abnormality. Multilevel nerve root sheath cysts. Paraspinal and other soft tissues: Negative Disc levels: Unless otherwise stated, the level by level findings below have not significantly changed since prior MRI 09/17/2015. Mild-to-moderate multilevel disc degeneration. At T7-T8, small right center disc protrusion without significant spinal canal stenosis. At T9-T10, mild facet arthrosis/ligamentum flavum hypertrophy. No significant spinal canal stenosis. Mild bilateral neural foraminal narrowing. At T10-T11, minimal bony retropulsion at the level of the T11 superior endplate. Mild disc bulge and endplate spurring. Mild facet degeneration. No significant spinal canal stenosis. Mild left neural foraminal narrowing. At T11-T12, mild facet degeneration. No significant spinal canal stenosis or neural foraminal narrowing. At T12-L1, small disc bulge with endplate spurring. Mild facet arthrosis/ligamentum flavum hypertrophy. No significant spinal canal stenosis. Mild left neural foraminal narrowing. At L1-L2, 5 mm bony retropulsion at the level of the L2 superior endplate. Disc bulge with endplate spurring. Facet arthrosis/ligamentum flavum hypertrophy. Mild/moderate spinal canal stenosis. Mild right with moderate left neural foraminal narrowing. The L2-L3 level is not included on axial imaging. Mild bony retropulsion. Disc bulge. Facet arthrosis/ligamentum flavum hypertrophy. Mild/moderate spinal canal stenosis. Moderate bilateral neural foraminal narrowing. IMPRESSION: Redemonstrated chronic T11 and L2 compression fractures without progressive height loss. No interval or acute compression fracture demonstrated. Spondylosis of the thoracic and visualized upper lumbar spine has not significantly changed since  prior MRI 09/17/2015. Spinal canal stenosis is greatest at L1-L2 and L2-L3 (mild-to-moderate), with bony retropulsion contributing to canal stenosis at these levels. Multilevel neural foraminal narrowing greatest on the left at L1-L2 and bilaterally at L2-L3 (moderate in severity at these sites). Electronically Signed   By: Kellie Simmering DO   On: 09/14/2019 08:16    Assessment & Plan:   Problem List Items Addressed This Visit      Unprioritized   Atypical chest pain    Chest x ray normal.  Pain is episodic and does not appear to be consistently related to exertion.  However she does have CAD and  Has a cardiologist. Advised to  Try treating with mylanta for empiric treatment of gastritis.  If symptoms do not respond to mylanta, I recommend that she return to cardiologist to have symptom worked up with ECHO/stress test.       Weakness  Accompanied by fatigue.  Screening labs normal . Etiology unclear. encouarged to see cardiologist for clearance ,  And if done would recommend more formal rehab /exercise program.   Lab Results  Component Value Date   TSH 2.22 03/12/2020   Lab Results  Component Value Date   VITAMINB12 >1500 (H) 03/12/2020   Lab Results  Component Value Date   WBC 5.8 03/12/2020   HGB 13.1 03/12/2020   HCT 39.1 03/12/2020   MCV 89.0 03/12/2020   PLT 200.0 03/12/2020   Lab Results  Component Value Date   IRON 102 11/13/2019   FERRITIN 28.4 11/13/2019   Lab Results  Component Value Date   CREATININE 0.68 09/20/2019          Other Visit Diagnoses    Shortness of breath    -  Primary   Relevant Orders   CBC with Differential/Platelet (Completed)   B Nat Peptide   DG Chest 2 View (Completed)   Weakness generalized       Relevant Orders   TSH (Completed)   Comprehensive metabolic panel   Vitamin D deficiency       Relevant Orders   VITAMIN D 25 Hydroxy (Vit-D Deficiency, Fractures) (Completed)   Fatigue, unspecified type       Relevant Orders    Vitamin B12 (Completed)      I have discontinued Mamie T. Reckart's ketotifen, neomycin-bacitracin-polymyxin, trimethoprim-polymyxin b, and erythromycin. I am also having her maintain her Vitamin D3, multivitamin, Omega-3 Fatty Acids (OMEGA 3 PO), Vitamin C CR, Lactobacillus (PROBIOTIC ACIDOPHILUS PO), Glucosamine Sulfate, vitamin B-12, CINNAMON PO, Turmeric, co-enzyme Q-10, acetaminophen, Magnesium, sodium chloride, loratadine, Ginkgo Biloba (GNP GINGKO BILOBA EXTRACT PO), Calcium Carbonate-Vitamin D, Hydrocortisone Ace-Pramoxine, fluticasone, clotrimazole-betamethasone, diclofenac sodium, pantoprazole, and atorvastatin.  No orders of the defined types were placed in this encounter.   Medications Discontinued During This Encounter  Medication Reason  . erythromycin ophthalmic ointment Completed Course  . ketotifen (ZADITOR) 0.025 % ophthalmic solution Completed Course  . neomycin-bacitracin-polymyxin (NEOSPORIN) [phone] ointment Completed Course  . trimethoprim-polymyxin b (POLYTRIM) ophthalmic solution Completed Course    I provided  30 minutes of  face-to-face time during this encounter reviewing patient's current problems and past surgeries, labs and imaging studies, providing counseling on the above mentioned problems , and coordination  of care .  Follow-up: No follow-ups on file.   Crecencio Mc, MD

## 2020-03-12 NOTE — Patient Instructions (Addendum)
Use Mylanta  For the burning sensation   Use Gas X , Mylanta Gas  Or Phasyme for gas pains that may be causing the RUQ pain    Try this regimen at bedtime  2 tylenol and 2 gas x

## 2020-03-13 DIAGNOSIS — R0789 Other chest pain: Secondary | ICD-10-CM | POA: Insufficient documentation

## 2020-03-13 LAB — CBC WITH DIFFERENTIAL/PLATELET
Basophils Absolute: 0 10*3/uL (ref 0.0–0.1)
Basophils Relative: 0.8 % (ref 0.0–3.0)
Eosinophils Absolute: 0.1 10*3/uL (ref 0.0–0.7)
Eosinophils Relative: 1.2 % (ref 0.0–5.0)
HCT: 39.1 % (ref 36.0–46.0)
Hemoglobin: 13.1 g/dL (ref 12.0–15.0)
Lymphocytes Relative: 26.1 % (ref 12.0–46.0)
Lymphs Abs: 1.5 10*3/uL (ref 0.7–4.0)
MCHC: 33.6 g/dL (ref 30.0–36.0)
MCV: 89 fl (ref 78.0–100.0)
Monocytes Absolute: 0.5 10*3/uL (ref 0.1–1.0)
Monocytes Relative: 8.1 % (ref 3.0–12.0)
Neutro Abs: 3.7 10*3/uL (ref 1.4–7.7)
Neutrophils Relative %: 63.8 % (ref 43.0–77.0)
Platelets: 200 10*3/uL (ref 150.0–400.0)
RBC: 4.39 Mil/uL (ref 3.87–5.11)
RDW: 13.6 % (ref 11.5–15.5)
WBC: 5.8 10*3/uL (ref 4.0–10.5)

## 2020-03-13 LAB — VITAMIN D 25 HYDROXY (VIT D DEFICIENCY, FRACTURES): VITD: 62.56 ng/mL (ref 30.00–100.00)

## 2020-03-13 LAB — TSH: TSH: 2.22 u[IU]/mL (ref 0.35–4.50)

## 2020-03-13 LAB — VITAMIN B12: Vitamin B-12: >1500 pg/mL — ABNORMAL HIGH (ref 211–911)

## 2020-03-13 NOTE — Assessment & Plan Note (Signed)
Accompanied by fatigue.  Screening labs normal . Etiology unclear. encouarged to see cardiologist for clearance ,  And if done would recommend more formal rehab /exercise program.   Lab Results  Component Value Date   TSH 2.22 03/12/2020   Lab Results  Component Value Date   VITAMINB12 >1500 (H) 03/12/2020   Lab Results  Component Value Date   WBC 5.8 03/12/2020   HGB 13.1 03/12/2020   HCT 39.1 03/12/2020   MCV 89.0 03/12/2020   PLT 200.0 03/12/2020   Lab Results  Component Value Date   IRON 102 11/13/2019   FERRITIN 28.4 11/13/2019   Lab Results  Component Value Date   CREATININE 0.68 09/20/2019

## 2020-03-13 NOTE — Assessment & Plan Note (Addendum)
Chest x ray normal.  Pain is episodic and does not appear to be consistently related to exertion.  However she does have CAD and  Has a cardiologist. Advised to  Try treating with mylanta for empiric treatment of gastritis.  If symptoms do not respond to mylanta, I recommend that she return to cardiologist to have symptom worked up with ECHO/stress test.

## 2020-03-25 ENCOUNTER — Other Ambulatory Visit: Payer: Self-pay

## 2020-03-25 ENCOUNTER — Inpatient Hospital Stay
Admission: EM | Admit: 2020-03-25 | Discharge: 2020-03-27 | DRG: 303 | Disposition: A | Discharge status: Home / Self Care | Payer: Medicare Other | Attending: Internal Medicine | Admitting: Internal Medicine

## 2020-03-25 ENCOUNTER — Emergency Department: Payer: Medicare Other

## 2020-03-25 ENCOUNTER — Observation Stay
Admit: 2020-03-25 | Discharge: 2020-03-25 | Disposition: A | Discharge status: Home / Self Care | Payer: Medicare Other | Attending: Internal Medicine | Admitting: Internal Medicine

## 2020-03-25 ENCOUNTER — Encounter: Payer: Self-pay | Admitting: Emergency Medicine

## 2020-03-25 DIAGNOSIS — K5790 Diverticulosis of intestine, part unspecified, without perforation or abscess without bleeding: Secondary | ICD-10-CM | POA: Diagnosis present

## 2020-03-25 DIAGNOSIS — K219 Gastro-esophageal reflux disease without esophagitis: Secondary | ICD-10-CM | POA: Diagnosis present

## 2020-03-25 DIAGNOSIS — R079 Chest pain, unspecified: Secondary | ICD-10-CM | POA: Diagnosis present

## 2020-03-25 DIAGNOSIS — I10 Essential (primary) hypertension: Secondary | ICD-10-CM | POA: Insufficient documentation

## 2020-03-25 DIAGNOSIS — Z881 Allergy status to other antibiotic agents status: Secondary | ICD-10-CM

## 2020-03-25 DIAGNOSIS — I160 Hypertensive urgency: Secondary | ICD-10-CM

## 2020-03-25 DIAGNOSIS — Z801 Family history of malignant neoplasm of trachea, bronchus and lung: Secondary | ICD-10-CM

## 2020-03-25 DIAGNOSIS — I700 Atherosclerosis of aorta: Secondary | ICD-10-CM | POA: Diagnosis present

## 2020-03-25 DIAGNOSIS — Z833 Family history of diabetes mellitus: Secondary | ICD-10-CM

## 2020-03-25 DIAGNOSIS — I2089 Other forms of angina pectoris: Secondary | ICD-10-CM | POA: Diagnosis present

## 2020-03-25 DIAGNOSIS — Z841 Family history of disorders of kidney and ureter: Secondary | ICD-10-CM

## 2020-03-25 DIAGNOSIS — K5730 Diverticulosis of large intestine without perforation or abscess without bleeding: Secondary | ICD-10-CM | POA: Diagnosis present

## 2020-03-25 DIAGNOSIS — Z66 Do not resuscitate: Secondary | ICD-10-CM | POA: Diagnosis present

## 2020-03-25 DIAGNOSIS — M1990 Unspecified osteoarthritis, unspecified site: Secondary | ICD-10-CM | POA: Diagnosis present

## 2020-03-25 DIAGNOSIS — E785 Hyperlipidemia, unspecified: Secondary | ICD-10-CM | POA: Diagnosis present

## 2020-03-25 DIAGNOSIS — I25118 Atherosclerotic heart disease of native coronary artery with other forms of angina pectoris: Secondary | ICD-10-CM | POA: Diagnosis not present

## 2020-03-25 DIAGNOSIS — N182 Chronic kidney disease, stage 2 (mild): Secondary | ICD-10-CM | POA: Diagnosis present

## 2020-03-25 DIAGNOSIS — Z823 Family history of stroke: Secondary | ICD-10-CM

## 2020-03-25 DIAGNOSIS — Z8249 Family history of ischemic heart disease and other diseases of the circulatory system: Secondary | ICD-10-CM

## 2020-03-25 DIAGNOSIS — Z888 Allergy status to other drugs, medicaments and biological substances status: Secondary | ICD-10-CM

## 2020-03-25 DIAGNOSIS — Z20822 Contact with and (suspected) exposure to covid-19: Secondary | ICD-10-CM | POA: Diagnosis present

## 2020-03-25 DIAGNOSIS — I7300 Raynaud's syndrome without gangrene: Secondary | ICD-10-CM | POA: Diagnosis present

## 2020-03-25 DIAGNOSIS — Z8052 Family history of malignant neoplasm of bladder: Secondary | ICD-10-CM

## 2020-03-25 DIAGNOSIS — I208 Other forms of angina pectoris: Secondary | ICD-10-CM | POA: Diagnosis present

## 2020-03-25 DIAGNOSIS — M810 Age-related osteoporosis without current pathological fracture: Secondary | ICD-10-CM | POA: Diagnosis present

## 2020-03-25 DIAGNOSIS — M545 Low back pain: Secondary | ICD-10-CM | POA: Diagnosis present

## 2020-03-25 DIAGNOSIS — M069 Rheumatoid arthritis, unspecified: Secondary | ICD-10-CM | POA: Diagnosis present

## 2020-03-25 DIAGNOSIS — Z811 Family history of alcohol abuse and dependence: Secondary | ICD-10-CM

## 2020-03-25 DIAGNOSIS — E279 Disorder of adrenal gland, unspecified: Secondary | ICD-10-CM | POA: Diagnosis present

## 2020-03-25 DIAGNOSIS — J449 Chronic obstructive pulmonary disease, unspecified: Secondary | ICD-10-CM | POA: Diagnosis present

## 2020-03-25 DIAGNOSIS — I129 Hypertensive chronic kidney disease with stage 1 through stage 4 chronic kidney disease, or unspecified chronic kidney disease: Secondary | ICD-10-CM | POA: Diagnosis present

## 2020-03-25 DIAGNOSIS — Z79899 Other long term (current) drug therapy: Secondary | ICD-10-CM

## 2020-03-25 DIAGNOSIS — K449 Diaphragmatic hernia without obstruction or gangrene: Secondary | ICD-10-CM | POA: Diagnosis present

## 2020-03-25 DIAGNOSIS — G8929 Other chronic pain: Secondary | ICD-10-CM | POA: Diagnosis present

## 2020-03-25 LAB — SARS CORONAVIRUS 2 BY RT PCR (HOSPITAL ORDER, PERFORMED IN ~~LOC~~ HOSPITAL LAB): SARS Coronavirus 2: NEGATIVE

## 2020-03-25 LAB — CBC
HCT: 43.4 % (ref 36.0–46.0)
Hemoglobin: 14.2 g/dL (ref 12.0–15.0)
MCH: 29.5 pg (ref 26.0–34.0)
MCHC: 32.7 g/dL (ref 30.0–36.0)
MCV: 90 fL (ref 80.0–100.0)
Platelets: 208 10*3/uL (ref 150–400)
RBC: 4.82 MIL/uL (ref 3.87–5.11)
RDW: 13.4 % (ref 11.5–15.5)
WBC: 5.8 10*3/uL (ref 4.0–10.5)
nRBC: 0 % (ref 0.0–0.2)

## 2020-03-25 LAB — COMPREHENSIVE METABOLIC PANEL
ALT: 24 U/L (ref 0–44)
AST: 49 U/L — ABNORMAL HIGH (ref 15–41)
Albumin: 4.4 g/dL (ref 3.5–5.0)
Alkaline Phosphatase: 52 U/L (ref 38–126)
Anion gap: 7 (ref 5–15)
BUN: 15 mg/dL (ref 8–23)
CO2: 28 mmol/L (ref 22–32)
Calcium: 9.1 mg/dL (ref 8.9–10.3)
Chloride: 103 mmol/L (ref 98–111)
Creatinine, Ser: 0.57 mg/dL (ref 0.44–1.00)
GFR calc Af Amer: >60 mL/min (ref >60)
GFR calc non Af Amer: >60 mL/min (ref >60)
Glucose, Bld: 95 mg/dL (ref 70–99)
Potassium: 4.1 mmol/L (ref 3.5–5.1)
Sodium: 138 mmol/L (ref 135–145)
Total Bilirubin: 0.7 mg/dL (ref 0.3–1.2)
Total Protein: 7.5 g/dL (ref 6.5–8.1)

## 2020-03-25 LAB — TROPONIN I (HIGH SENSITIVITY)
Troponin I (High Sensitivity): 15 ng/L (ref <18)
Troponin I (High Sensitivity): 16 ng/L (ref <18)
Troponin I (High Sensitivity): 19 ng/L — ABNORMAL HIGH (ref <18)

## 2020-03-25 MED ORDER — PANTOPRAZOLE SODIUM 40 MG PO TBEC
40.0000 mg | DELAYED_RELEASE_TABLET | Freq: Every day | ORAL | Status: DC
  Administered 2020-03-26 – 2020-03-27 (×2): 40 mg via ORAL
  Filled 2020-03-25 (×3): qty 1

## 2020-03-25 MED ORDER — SODIUM CHLORIDE 0.9% FLUSH: 3.0000 mL | Freq: Once | INTRAVENOUS | Status: DC

## 2020-03-25 MED ORDER — METOPROLOL TARTRATE 25 MG PO TABS
12.5000 mg | ORAL_TABLET | Freq: Two times a day (BID) | ORAL | Status: DC
  Administered 2020-03-25 – 2020-03-27 (×4): 12.5 mg via ORAL
  Filled 2020-03-25 (×4): qty 1

## 2020-03-25 MED ORDER — HYDRALAZINE HCL 10 MG PO TABS
10.0000 mg | ORAL_TABLET | Freq: Four times a day (QID) | ORAL | Status: DC | PRN
  Filled 2020-03-25: qty 1

## 2020-03-25 MED ORDER — ENOXAPARIN SODIUM 40 MG/0.4ML ~~LOC~~ SOLN
40.0000 mg | SUBCUTANEOUS | Status: DC
  Administered 2020-03-25 – 2020-03-26 (×2): 40 mg via SUBCUTANEOUS
  Filled 2020-03-25 (×2): qty 0.4

## 2020-03-25 MED ORDER — ACETAMINOPHEN 325 MG PO TABS: 650.0000 mg | ORAL_TABLET | ORAL | Status: DC | PRN

## 2020-03-25 MED ORDER — ASPIRIN EC 81 MG PO TBEC: 81.0000 mg | DELAYED_RELEASE_TABLET | Freq: Every day | ORAL | Status: DC

## 2020-03-25 MED ORDER — SODIUM CHLORIDE 0.9 % IV BOLUS
500.0000 mL | Freq: Once | INTRAVENOUS | Status: AC
  Administered 2020-03-25: 500 mL via INTRAVENOUS

## 2020-03-25 MED ORDER — ONDANSETRON HCL 4 MG/2ML IJ SOLN: 4.0000 mg | Freq: Four times a day (QID) | INTRAMUSCULAR | Status: DC | PRN

## 2020-03-25 MED ORDER — ATORVASTATIN CALCIUM 10 MG PO TABS
10.0000 mg | ORAL_TABLET | Freq: Every evening | ORAL | Status: DC
  Administered 2020-03-25 – 2020-03-26 (×2): 10 mg via ORAL
  Filled 2020-03-25 (×2): qty 1

## 2020-03-25 MED ORDER — ASPIRIN 81 MG PO CHEW
CHEWABLE_TABLET | ORAL | Status: AC
  Administered 2020-03-25: 81 mg
  Filled 2020-03-25: qty 1

## 2020-03-25 MED ORDER — IOHEXOL 350 MG/ML SOLN
75.0000 mL | Freq: Once | INTRAVENOUS | Status: AC | PRN
  Administered 2020-03-25: 75 mL via INTRAVENOUS

## 2020-03-25 MED ORDER — HYDRALAZINE HCL 20 MG/ML IJ SOLN
5.0000 mg | Freq: Once | INTRAMUSCULAR | Status: AC
  Administered 2020-03-25: 5 mg via INTRAVENOUS
  Filled 2020-03-25: qty 1

## 2020-03-25 MED ORDER — ASPIRIN EC 81 MG PO TBEC
81.0000 mg | DELAYED_RELEASE_TABLET | Freq: Every evening | ORAL | Status: DC
  Administered 2020-03-26: 81 mg via ORAL
  Filled 2020-03-25: qty 1

## 2020-03-25 NOTE — H&P (Signed)
History and Physical    [name] JSE:[id number] DOB: 1927/03/19 DOA: 03/25/2020  PCP: Crecencio Mc, MD  Patient coming from: Home  I have personally briefly reviewed patient's old medical records in [place]  Chief Complaint: Chest pain  HPI: [name] is a 86 y.o. female with medical history significant for COPD, hyperlipidemia, RA, GERD, chronic lumbar back pain who presents to the ED for evaluation of chest pain.  Patient reports couple weeks of intermittent lower sternal and right lower chest pain.  She says she saw her PCP when symptoms initially began and it was felt due to indigestion.  She has been taking Protonix and over-the-counter indigestion medications.  However, she has had continued symptoms and describes a squeezing sensation at her lower sternum and at the same area of her back.  Pain would also radiate from beneath her right rib cage down to her upper hip area.    Symptoms worsen with exertion and she did have an episode of lightheadedness when she walked up a flight of stairs.  She did not fall or lose consciousness.  She has had some associated dyspnea.  She has had occasional nausea without emesis.  She denies any difficulty with swallowing foods or liquids.  She otherwise denies any subjective fevers, chills, diaphoresis, abdominal pain, vomiting, dysuria.  She denies any similar episode in the past.  She says her only current regular medications are aspirin 81 mg daily, atorvastatin, and pantoprazole.  She reports a previous normal stress test in the 1980s.  Patient is very functional at baseline.  She lives with her daughter.  She ambulates on her own power and routinely does yard work.  ED Course:  Initial vitals showed BP of 207/96, pulse 58, RR 18, temp 97.6 Fahrenheit, SPO2 99% on room air.  Labs notable for WBC 5.8, hemoglobin 14.2, platelets 208,000, sodium 138, potassium 4.1, bicarb 28, BUN 15, creatinine 0.57, serum glucose 95,  high-sensitivity troponin I 15 and 16.  SARS-CoV-2 PCR is negative.  2 view chest x-ray showed hyperinflated lungs without acute abnormality.  CTA chest/abdomen/pelvis was negative for evidence of PE or acute abnormalities within the chest, abdomen or pelvis.  Stable left adrenal mass is seen as well as small hiatal hernia and noninflamed sigmoid diverticulosis.  Patient was given 500 cc normal saline and IV hydralazine 5 mg once hospitalist service was consulted to admit for further evaluation and management.  Review of Systems: All systems reviewed and are negative except as documented in history of present illness above.   Past Medical History:  Diagnosis Date  . Allergy   . Arthritis   . Atherosclerosis   . Atherosclerosis of abdominal aorta ([place])   . Chronic kidney disease    stage 2  . CKD (chronic kidney disease) stage 2, GFR 60-89 ml/min   . Collagen vascular disease ([place])   . Diverticulosis 2008  . GERD (gastroesophageal reflux disease)   . Heart murmur   . Hyperlipidemia   . Lumbar spinal stenosis   . Osteoporosis   . Ovarian lump   . RA (rheumatoid arthritis) ([place])   . Raynaud's disease   . Rheumatoid arthritis ([place])   . Spinal stenosis     Past Surgical History:  Procedure Laterality Date  . BREAST SURGERY Left 1965   Benign biopsy  . CHOLECYSTECTOMY  2005   [place]  . COLONOSCOPY  2008   Dr. Donnella Sham  . EXCISION OF BREAST BIOPSY Left 1965   benign  .  EYE SURGERY Bilateral    Cataract Extraction with IOL  . KYPHOPLASTY N/A 12/17/2016   Procedure: KYPHOPLASTY;  Surgeon: Hessie Knows, MD;  Location: [facility] ORS;  Service: Orthopedics;  Laterality: N/A;  l4  . LUMBAR LAMINECTOMY  05/09/2017   L2-L5  . VISCERAL ANGIOGRAPHY N/A 08/15/2017   Procedure: VISCERAL ANGIOGRAPHY;  Surgeon: Algernon Huxley, MD;  Location: [facility];  Service: Cardiovascular;  Laterality: N/A;  . VISCERAL ANGIOGRAPHY N/A 10/05/2017   Procedure: VISCERAL ANGIOGRAPHY;  Surgeon:  Algernon Huxley, MD;  Location: [facility];  Service: Cardiovascular;  Laterality: N/A;  . VISCERAL ARTERY INTERVENTION N/A 08/15/2017   Procedure: VISCERAL ARTERY INTERVENTION;  Surgeon: Algernon Huxley, MD;  Location: [facility];  Service: Cardiovascular;  Laterality: N/A;    Social History:  reports that she has never smoked. She has never used smokeless tobacco. She reports that she does not drink alcohol or use drugs.  Allergies  Allergen Reactions  . Ibandronic Acid Other (See Comments)    Achy all over. Flu like S/S  . Other Other (See Comments)    Achy all over. Flu like S/S Dysphagia  . Actonel [Risedronate] Other (See Comments)    Dysphagia  . Raloxifene Other (See Comments)    Mood swings  . Versed [Midazolam] Other (See Comments)    Difficult waking up and memory loss  . Doxycycline Rash  . Prednisone Rash    Family History  Problem Relation Age of Onset  . Heart disease Mother   . Hypertension Father   . Heart disease Brother 26       heart attack  . Cancer Brother        bladder  . Diabetes Sister   . Alcohol abuse Sister   . Kidney disease Sister   . Diabetes Brother   . Stroke Brother   . Diabetes Maternal Grandmother   . Heart disease Maternal Grandmother   . Cancer Brother        lung     Prior to Admission medications   Medication Sig Start Date End Date Taking? Authorizing Provider  atorvastatin (LIPITOR) 10 MG tablet Take 1 tablet (10 mg total) by mouth every evening. 12/21/19  Yes Crecencio Mc, MD  pantoprazole (PROTONIX) 40 MG tablet Take 1 tablet (40 mg total) by mouth daily. 10/11/19  Yes Crecencio Mc, MD  acetaminophen (TYLENOL) 500 MG tablet Take 500 mg by mouth every 6 (six) hours as needed for mild pain.    [provider]  Ascorbic Acid (VITAMIN C CR) 500 MG CPCR Take 500 mg by mouth daily.    [provider]  Calcium Carbonate-Vitamin D 600-200 MG-UNIT CAPS Take by mouth.    [provider]    Cholecalciferol (VITAMIN D3) 3000 UNITS TABS Take 3,000 Units by mouth 2 (two) times daily.     [provider]  CINNAMON PO Take 1,000 mg by mouth daily.     [provider]  clotrimazole-betamethasone (LOTRISONE) cream Apply topically 2 (two) times daily. 03/14/19   Poulose, Bethel Born, NP  co-enzyme Q-10 30 MG capsule Take 30 mg by mouth daily.     [provider]  diclofenac sodium (VOLTAREN) 1 % GEL  08/02/19   [provider]  fluticasone (FLONASE) 50 MCG/ACT nasal spray Place 1 spray into both nostrils daily.    [provider]  Ginkgo Biloba (GNP GINGKO BILOBA EXTRACT PO) Take 1 tablet by mouth daily.  [provider]  Glucosamine Sulfate 500 MG CAPS Take 500 mg by mouth daily.    [provider]  Lactobacillus (PROBIOTIC ACIDOPHILUS PO) Take 1 capsule by mouth daily.     [provider]  loratadine (CLARITIN) 10 MG tablet Take 10 mg by mouth daily as needed for allergies.    [provider]  Magnesium 400 MG CAPS Take 400 mg by mouth daily.    [provider]  Multiple Vitamin (MULTIVITAMIN) capsule Take 1 capsule by mouth daily.    [provider]  Omega-3 Fatty Acids (OMEGA 3 PO) Take 520 mg by mouth daily.     [provider]  Pramoxine-HC (HYDROCORTISONE ACE-PRAMOXINE) 2.5-1 % CREA Apply topically as needed.     [provider]  sodium chloride (OCEAN) 0.65 % SOLN nasal spray Place 1 spray into both nostrils every 4 (four) hours as needed for congestion.     [provider]  Turmeric 450 MG CAPS Take 450 Units by mouth.    [provider]  vitamin B-12 (CYANOCOBALAMIN) 1000 MCG tablet Take 1,000 mcg by mouth daily.    [provider]    Physical Exam: Vitals:   03/25/20 1924 03/25/20 1932 03/25/20 2000 03/25/20 2030  BP:  (!) 202/89 (!) 207/91 (!) 148/74  Pulse: 77   71  Resp: 16 (!) 28 (!) 21 (!) 28  Temp:      TempSrc:      SpO2:  100%   98%  Weight:      Height:       Constitutional: Thin elderly woman resting in bed with head elevated, NAD, calm, comfortable Eyes: PERRL, lids and conjunctivae normal ENMT: Mucous membranes are moist. Posterior pharynx clear of any exudate or lesions.Normal dentition.  Neck: normal, supple, no masses. Respiratory: clear to auscultation bilaterally, no wheezing, no crackles. Normal respiratory effort. No accessory muscle use.  Cardiovascular: Regular rate and rhythm, no murmurs / rubs / gallops. No extremity edema. 2+ pedal pulses. Abdomen: no tenderness, no masses palpated. No hepatosplenomegaly. Bowel sounds positive.  Musculoskeletal: no clubbing / cyanosis. No joint deformity upper and lower extremities. Good ROM, no contractures. Normal muscle tone.  Skin: no rashes, lesions, ulcers. No induration Neurologic: CN 2-12 grossly intact. Sensation intact,Strength 5/5 in all 4.  Psychiatric: Normal judgment and insight. Alert and oriented x 3. Normal mood.   Labs on Admission: I have personally reviewed following labs and imaging studies  CBC: Recent Labs  Lab 03/25/20 1138  WBC 5.8  HGB 14.2  HCT 43.4  MCV 90.0  PLT 540   Basic Metabolic Panel: Recent Labs  Lab 03/25/20 1138  NA 138  K 4.1  CL 103  CO2 28  GLUCOSE 95  BUN 15  CREATININE 0.57  CALCIUM 9.1   GFR: Estimated Creatinine Clearance: 33.6 mL/min (by C-G formula based on SCr of 0.57 mg/dL). Liver Function Tests: Recent Labs  Lab 03/25/20 1138  AST 49*  ALT 24  ALKPHOS 52  BILITOT 0.7  PROT 7.5  ALBUMIN 4.4   No results for input(s): LIPASE, AMYLASE in the last 168 hours. No results for input(s): AMMONIA in the last 168 hours. Coagulation Profile: No results for input(s): INR, PROTIME in the last 168 hours. Cardiac Enzymes: No results for input(s): CKTOTAL, CKMB, CKMBINDEX, TROPONINI in the last 168 hours. BNP (last 3 results) No results for input(s): PROBNP in the last 8760 hours. HbA1C: No  results for input(s): HGBA1C in the last 72 hours.  CBG: No results for input(s): GLUCAP in the last 168 hours. Lipid Profile: No results for input(s): CHOL, HDL, LDLCALC, TRIG, CHOLHDL, LDLDIRECT in the last 72 hours. Thyroid Function Tests: No results for input(s): TSH, T4TOTAL, FREET4, T3FREE, THYROIDAB in the last 72 hours. Anemia Panel: No results for input(s): VITAMINB12, FOLATE, FERRITIN, TIBC, IRON, RETICCTPCT in the last 72 hours. Urine analysis:    Component Value Date/Time   COLORURINE YELLOW 11/30/2016 1044   APPEARANCEUR CLEAR 11/30/2016 1044   APPEARANCEUR Clear 12/19/2015 1423   LABSPEC 1.008 11/30/2016 1044   PHURINE 6.0 11/30/2016 1044   GLUCOSEU NEGATIVE 11/30/2016 1044   HGBUR NEGATIVE 11/30/2016 1044   BILIRUBINUR NEG 11/30/2016 [place] 11/30/2016 1044   BILIRUBINUR Negative 12/19/2015 [place] 11/30/2016 1044   PROTEINUR NEG 11/30/2016 1047   PROTEINUR NEGATIVE 11/30/2016 1044   UROBILINOGEN 0.2 11/30/2016 1047   NITRITE NEG 11/30/2016 1047   NITRITE NEGATIVE 11/30/2016 1044   LEUKOCYTESUR Negative 11/30/2016 1047   LEUKOCYTESUR 1+ (A) 12/19/2015 1423    Radiological Exams on Admission: DG Chest 2 View  Result Date: 03/25/2020 CLINICAL DATA:  Right-sided chest pain EXAM: CHEST - 2 VIEW COMPARISON:  03/12/2020 FINDINGS: Cardiac shadows within normal limits. Aortic calcifications are again seen. Lungs are hyperinflated bilaterally consistent with COPD. Chronic compression deformity in the lower thoracic spine is noted. Multilevel degenerative changes are seen. IMPRESSION: COPD without acute abnormality. Electronically Signed   By: Inez Catalina M.D.   On: 03/25/2020 12:09   CT Angio Chest/Abd/Pel for Dissection W and/or Wo Contrast  Result Date: 03/25/2020 CLINICAL DATA:  Intermittent chest pain. EXAM: CT ANGIOGRAPHY CHEST, ABDOMEN AND PELVIS TECHNIQUE: Non-contrast CT of the chest was initially obtained. Multidetector CT  imaging through the chest, abdomen and pelvis was performed using the standard protocol during bolus administration of intravenous contrast. Multiplanar reconstructed images and MIPs were obtained and reviewed to evaluate the vascular anatomy. CONTRAST:  23mL OMNIPAQUE IOHEXOL 350 MG/ML SOLN COMPARISON:  July 24, 2019 FINDINGS: CTA CHEST FINDINGS Cardiovascular: Satisfactory opacification of the pulmonary arteries to the segmental level. No evidence of pulmonary embolism. Normal heart size. No pericardial effusion. Mediastinum/Nodes: No enlarged mediastinal, hilar, or axillary lymph nodes. Thyroid gland, trachea, and esophagus demonstrate no significant findings. Lungs/Pleura: Lungs are clear. No pleural effusion or pneumothorax. Musculoskeletal: Third, fourth and fifth chronic lateral right rib fractures are seen. Multilevel degenerative changes seen throughout the thoracic spine. Review of the MIP images confirms the above findings. CTA ABDOMEN AND PELVIS FINDINGS VASCULAR Aorta: Moderate severity calcification of a normal caliber aorta without aneurysm, dissection, vasculitis or significant stenosis. Celiac: Patent without evidence of aneurysm, dissection, vasculitis or significant stenosis. SMA: Patent SMA stent without evidence of aneurysm, dissection, vasculitis or significant stenosis. Renals: Moderate-severity calcification. Both renal arteries are patent without evidence of aneurysm, dissection, vasculitis, fibromuscular dysplasia or hemodynamically significant stenosis. IMA: Patent without evidence of aneurysm, dissection, vasculitis or significant stenosis. Inflow: Moderate to marked severity calcification without evidence of aneurysm, dissection, vasculitis or hemodynamically significant stenosis. Veins: No obvious venous abnormality within the limitations of this arterial phase study. Review of the MIP images confirms the above findings. NON-VASCULAR Hepatobiliary: No focal liver abnormality is  seen. Status post cholecystectomy. No biliary dilatation. Pancreas: Unremarkable. No pancreatic ductal dilatation or surrounding inflammatory changes. Spleen: Normal in size without focal abnormality. Adrenals/Urinary Tract: The right adrenal gland is normal in appearance. A stable 1.3 cm x 0.9 cm isodense left adrenal mass is noted. Kidneys are  normal, without renal calculi, focal lesion, or hydronephrosis. Bladder is unremarkable. Stomach/Bowel: The there is a small hiatal hernia. Appendix appears normal. No evidence of bowel wall thickening, distention, or inflammatory changes. Noninflamed diverticula are seen throughout the sigmoid colon. Lymphatic: No abnormal abdominal or pelvic lymph nodes are identified. Reproductive: A 1.2 cm x 0.8 cm calcified uterine fibroid is seen. The bilateral adnexa are unremarkable. Other: No abdominal wall hernia or abnormality. No abdominopelvic ascites. Musculoskeletal: Degenerative changes seen throughout the lumbar spine. Review of the MIP images confirms the above findings. IMPRESSION: 1. No evidence of pulmonary embolus. 2. No acute abnormalities within the chest, abdomen or pelvis. 3. Stable left adrenal mass which may represent an adrenal adenoma. 4. Small hiatal hernia. 5. Noninflamed sigmoid diverticulosis. Aortic Atherosclerosis (ICD10-I70.0). Electronically Signed   By: Virgina Norfolk M.D.   On: 03/25/2020 18:11    EKG: Independently reviewed. Normal sinus rhythm without acute ischemic changes.  Assessment/Plan Principal Problem:   Chest pain Active Problems:   Hyperlipidemia   Accelerated hypertension  [name] is a 86 y.o. female with medical history significant for COPD, hyperlipidemia, RA, GERD, chronic lumbar back pain who is admitted for chest pain rule out.  Chest pain: Patient with atypical chest pain symptoms.  Initial labs are reassuring with negative high-sensitivity troponin I x2.  EKG without significant ischemic changes.  CTA  chest/abdomen/pelvis without evidence of PE or acute abnormalities.  Patent SMA stent is seen.  Aortic atherosclerosis noted. -Continue aspirin and atorvastatin -Repeat troponin -Monitor on telemetry -Obtain echocardiogram -EKG as needed -Consider further evaluation with inpatient versus outpatient stress testing  Accelerated hypertension: Significant hypertension on ED arrival up to 223/85.  Improved with IV hydralazine.  Review of previous office notes show SBP is usually 160 or lower. -Start metoprolol 12.5 mg twice daily as HR tolerates -Hydralazine if needed for SBP >180 and/or DBP >110  Hyperlipidemia: Continue atorvastatin.  DVT prophylaxis: Lovenox Code Status: DNR, confirmed with patient Family Communication: Discussed with patient's daughter at bedside Disposition Plan: From home, likely discharge to home pending chest pain rule out Consults called: None Admission status:  Status is: Observation  The patient remains OBS appropriate and will d/c before 2 midnights.  Dispo: The patient is from: Home              Anticipated d/c is to: Home              Anticipated d/c date is: 1 day              Patient currently is not medically stable to d/c.   Zada Finders MD [facility]  If 7PM-7AM, please contact night-coverage [email or web address]  03/25/2020, 8:43 PM

## 2020-03-25 NOTE — ED Notes (Signed)
Patient assisted to ambulate to restroom. Steady gait with one person assist. Repositioned in bed and given warm blanket. Call light in reach. No further needs expressed at this time.

## 2020-03-25 NOTE — ED Notes (Signed)
Pt reports intermittent chest pain.  Pain beneath right breast and right mid back area.  No sob.  Sx for several weeks  No n/v/d.  Pt alert  Family with pt.

## 2020-03-25 NOTE — ED Notes (Signed)
Assisted pt to restroom  

## 2020-03-25 NOTE — ED Notes (Signed)
Pt updated and VS reassessed

## 2020-03-25 NOTE — ED Provider Notes (Signed)
[facility] Emergency Department Provider Note  ____________________________________________  Time seen: Approximately 6:57 PM  I have reviewed the triage vital signs and the nursing notes.   HISTORY  Chief Complaint Chest Pain and Dizziness    HPI Cyd Hostler is a 86 y.o. female with a history of atherosclerosis, CKD, diverticulosis who comes the ED complaining of chest pain for the past several days, intermittent, worse with exertion, better with rest, radiating to the back abdomen and groin, associated with shortness of breath and dizziness.  Mostly located in the right lower chest and retrosternal chest and described as tightness.  Sees cardiology Dr. Nehemiah Massed.  Has a history of celiac trunk stent by Dr. Lucky Cowboy 2 years ago      Past Medical History:  Diagnosis Date  . Allergy   . Arthritis   . Atherosclerosis   . Atherosclerosis of abdominal aorta ([place])   . Chronic kidney disease    stage 2  . CKD (chronic kidney disease) stage 2, GFR 60-89 ml/min   . Collagen vascular disease ([place])   . Diverticulosis 2008  . GERD (gastroesophageal reflux disease)   . Heart murmur   . Hyperlipidemia   . Lumbar spinal stenosis   . Osteoporosis   . Ovarian lump   . RA (rheumatoid arthritis) ([place])   . Raynaud's disease   . Rheumatoid arthritis ([place])   . Spinal stenosis      Patient Active Problem List   Diagnosis Date Noted  . Atypical chest pain 03/13/2020  . Elevated AST (SGOT) 10/11/2019  . Osteoporosis, postmenopausal 08/07/2019  . Chronic pain in right shoulder 12/06/2018  . Primary osteoarthritis of both hands 06/05/2018  . Pancreatic cyst 04/10/2018  . Myalgia due to statin 04/10/2018  . Raynaud's phenomenon without gangrene 04/04/2018  . Weakness 03/14/2018  . Varicose veins of leg with pain, right 09/13/2017  . History of lumbar laminectomy for spinal cord decompression 07/19/2017  . DNAR (do not attempt resuscitation) 04/19/2017  .  Compression fracture of L4 lumbar vertebra 12/10/2016  . Celiac artery stenosis ([place]) 12/06/2016  . Abdominal pain 04/09/2016  . Preventative health care 01/03/2016  . Hyperglycemia 12/19/2015  . Compression fracture of thoracic spine, non-traumatic ([place]) 08/19/2015  . Chronic thoracic spine pain 08/15/2015  . Low back pain 08/15/2015  . Compression fracture of L2 lumbar vertebra ([place]) 08/15/2015  . Hyperlipidemia   . Osteoporosis   . Rheumatoid arthritis ([place])   . Atherosclerosis of abdominal aorta ([place])   . Lumbar spinal stenosis   . Chronic kidney disease, stage III (moderate)   . CAD (coronary artery disease) 06/24/2014  . History of adenomatous polyp of colon 06/14/2014  . Pulmonary nodule seen on imaging study 07/13/2012  . Irritable bowel syndrome 02/16/2012     Past Surgical History:  Procedure Laterality Date  . BREAST SURGERY Left 1965   Benign biopsy  . CHOLECYSTECTOMY  2005   [place]  . COLONOSCOPY  2008   Dr. Donnella Sham  . EXCISION OF BREAST BIOPSY Left 1965   benign  . EYE SURGERY Bilateral    Cataract Extraction with IOL  . KYPHOPLASTY N/A 12/17/2016   Procedure: KYPHOPLASTY;  Surgeon: Hessie Knows, MD;  Location: [facility] ORS;  Service: Orthopedics;  Laterality: N/A;  l4  . LUMBAR LAMINECTOMY  05/09/2017   L2-L5  . VISCERAL ANGIOGRAPHY N/A 08/15/2017   Procedure: VISCERAL ANGIOGRAPHY;  Surgeon: Algernon Huxley, MD;  Location: [place] [facility];  Service: Cardiovascular;  Laterality: N/A;  .  VISCERAL ANGIOGRAPHY N/A 10/05/2017   Procedure: VISCERAL ANGIOGRAPHY;  Surgeon: Algernon Huxley, MD;  Location: [place] [facility];  Service: Cardiovascular;  Laterality: N/A;  . VISCERAL ARTERY INTERVENTION N/A 08/15/2017   Procedure: VISCERAL ARTERY INTERVENTION;  Surgeon: Algernon Huxley, MD;  Location: [place] [facility];  Service: Cardiovascular;  Laterality: N/A;     Prior to Admission medications   Medication Sig Start Date End Date Taking? Authorizing Provider   atorvastatin (LIPITOR) 10 MG tablet Take 1 tablet (10 mg total) by mouth every evening. 12/21/19  Yes Crecencio Mc, MD  pantoprazole (PROTONIX) 40 MG tablet Take 1 tablet (40 mg total) by mouth daily. 10/11/19  Yes Crecencio Mc, MD  acetaminophen (TYLENOL) 500 MG tablet Take 500 mg by mouth every 6 (six) hours as needed for mild pain.    [provider]  Ascorbic Acid (VITAMIN C CR) 500 MG CPCR Take 500 mg by mouth daily.    [provider]  Calcium Carbonate-Vitamin D 600-200 MG-UNIT CAPS Take by mouth.    [provider]  Cholecalciferol (VITAMIN D3) 3000 UNITS TABS Take 3,000 Units by mouth 2 (two) times daily.     [provider]  CINNAMON PO Take 1,000 mg by mouth daily.     [provider]  clotrimazole-betamethasone (LOTRISONE) cream Apply topically 2 (two) times daily. 03/14/19   Poulose, Bethel Born, NP  co-enzyme Q-10 30 MG capsule Take 30 mg by mouth daily.     [provider]  diclofenac sodium (VOLTAREN) 1 % GEL  08/02/19   [provider]  fluticasone (FLONASE) 50 MCG/ACT nasal spray Place 1 spray into both nostrils daily.    [provider]  Ginkgo Biloba (GNP GINGKO BILOBA EXTRACT PO) Take 1 tablet by mouth daily.    [provider]  Glucosamine Sulfate 500 MG CAPS Take 500 mg by mouth daily.    [provider]  Lactobacillus (PROBIOTIC ACIDOPHILUS PO) Take 1 capsule by mouth daily.     [provider]  loratadine (CLARITIN) 10 MG tablet Take 10 mg by mouth daily as needed for allergies.    [provider]  Magnesium 400 MG CAPS Take 400 mg by mouth daily.    [provider]  Multiple Vitamin (MULTIVITAMIN) capsule Take 1 capsule by mouth daily.    [provider]  Omega-3 Fatty Acids (OMEGA 3 PO) Take 520 mg by mouth daily.     [provider]  Pramoxine-HC (HYDROCORTISONE ACE-PRAMOXINE) 2.5-1 % CREA Apply topically as needed.     [provider]  sodium chloride (OCEAN) 0.65 % SOLN nasal spray Place 1 spray into both nostrils every 4 (four) hours as needed for congestion.     [provider]  Turmeric 450 MG CAPS Take 450 Units by mouth.    [provider]  vitamin B-12 (CYANOCOBALAMIN) 1000 MCG tablet Take 1,000 mcg by mouth daily.    [provider]     Allergies Ibandronic acid, Other, Actonel [risedronate], Raloxifene, Versed [midazolam], Doxycycline, and Prednisone   Family History  Problem Relation Age of Onset  . Heart disease Mother   . Hypertension Father   . Heart disease Brother 30       heart attack  . Cancer Brother        bladder  . Diabetes Sister   . Alcohol abuse Sister   . Kidney disease Sister   . Diabetes Brother   . Stroke Brother   .  Diabetes Maternal Grandmother   . Heart disease Maternal Grandmother   . Cancer Brother        lung    Social History Social History   Tobacco Use  . Smoking status: Never Smoker  . Smokeless tobacco: Never Used  . Tobacco comment: smoking cessation materials not required  Substance Use Topics  . Alcohol use: No    Alcohol/week: 0.0 standard drinks  . Drug use: No    Review of Systems  Constitutional:   No fever or chills.  ENT:   No sore throat. No rhinorrhea. Cardiovascular: Positive chest pain as above without syncope. Respiratory:   Positive shortness of breath without cough. Gastrointestinal:   Negative for abdominal pain, vomiting and diarrhea.  Musculoskeletal:   Negative for focal pain or swelling All other systems reviewed and are negative except as documented above in ROS and HPI.  ____________________________________________   PHYSICAL EXAM:  VITAL SIGNS: ED Triage Vitals  Enc Vitals Group     BP 03/25/20 1136 (!) 207/96     Pulse Rate 03/25/20 1136 (!) 58     Resp 03/25/20 1136 18     Temp 03/25/20 1136 97.6 F (36.4 C)     Temp Source 03/25/20 1136 Oral     SpO2 03/25/20 1136 99 %      Weight 03/25/20 1135 102 lb (46.3 kg)     Height 03/25/20 1135 5' (1.524 m)     Head Circumference --      Peak Flow --      Pain Score 03/25/20 1134 8     Pain Loc --      Pain Edu? --      Excl. in [place]? --     Vital signs reviewed, nursing assessments reviewed.   Constitutional:   Alert and oriented. Non-toxic appearance. Eyes:   Conjunctivae are normal. EOMI. PERRL. ENT      Head:   Normocephalic and atraumatic.      Nose:   Wearing a mask.      Mouth/Throat:   Wearing a mask.      Neck:   No meningismus. Full ROM. Hematological/Lymphatic/Immunilogical:   No cervical lymphadenopathy. Cardiovascular:   RRR, heart rate 60. Symmetric bilateral radial and DP pulses.  No murmurs. Cap refill less than 2 seconds. Respiratory:   Normal respiratory effort without tachypnea/retractions. Breath sounds are clear and equal bilaterally. No wheezes/rales/rhonchi. Gastrointestinal:   Soft and nontender. Non distended. There is no CVA tenderness.  No rebound, rigidity, or guarding. Musculoskeletal:   Normal range of motion in all extremities. No joint effusions.  No lower extremity tenderness.  No edema. Neurologic:   Normal speech and language.  Motor grossly intact. No acute focal neurologic deficits are appreciated.  Skin:    Skin is warm, dry and intact. No rash noted.  No petechiae, purpura, or bullae.  ____________________________________________    LABS (pertinent positives/negatives) (all labs ordered are listed, but only abnormal results are displayed) Labs Reviewed  COMPREHENSIVE METABOLIC PANEL - Abnormal; Notable for the following components:      Result Value   AST 49 (*)    All other components within normal limits  SARS CORONAVIRUS 2 BY RT PCR (HOSPITAL ORDER, [place] [facility])  CBC  TROPONIN I (HIGH SENSITIVITY)  TROPONIN I (HIGH SENSITIVITY)   ____________________________________________   EKG  Interpreted by me Normal sinus rhythm rate of  64, normal axis and intervals.  Poor R wave progression.  Slight ST  depression in V5 and V6 lateral leads.  No significant ST elevations.  Normal T waves.  ST depression appears new compared to previous EKG from January 18, 2019.  ____________________________________________    RADIOLOGY  DG Chest 2 View  Result Date: 03/25/2020 CLINICAL DATA:  Right-sided chest pain EXAM: CHEST - 2 VIEW COMPARISON:  03/12/2020 FINDINGS: Cardiac shadows within normal limits. Aortic calcifications are again seen. Lungs are hyperinflated bilaterally consistent with COPD. Chronic compression deformity in the lower thoracic spine is noted. Multilevel degenerative changes are seen. IMPRESSION: COPD without acute abnormality. Electronically Signed   By: Inez Catalina M.D.   On: 03/25/2020 12:09   CT Angio Chest/Abd/Pel for Dissection W and/or Wo Contrast  Result Date: 03/25/2020 CLINICAL DATA:  Intermittent chest pain. EXAM: CT ANGIOGRAPHY CHEST, ABDOMEN AND PELVIS TECHNIQUE: Non-contrast CT of the chest was initially obtained. Multidetector CT imaging through the chest, abdomen and pelvis was performed using the standard protocol during bolus administration of intravenous contrast. Multiplanar reconstructed images and MIPs were obtained and reviewed to evaluate the vascular anatomy. CONTRAST:  47mL OMNIPAQUE IOHEXOL 350 MG/ML SOLN COMPARISON:  July 24, 2019 FINDINGS: CTA CHEST FINDINGS Cardiovascular: Satisfactory opacification of the pulmonary arteries to the segmental level. No evidence of pulmonary embolism. Normal heart size. No pericardial effusion. Mediastinum/Nodes: No enlarged mediastinal, hilar, or axillary lymph nodes. Thyroid gland, trachea, and esophagus demonstrate no significant findings. Lungs/Pleura: Lungs are clear. No pleural effusion or pneumothorax. Musculoskeletal: Third, fourth and fifth chronic lateral right rib fractures are seen. Multilevel degenerative changes seen throughout the thoracic spine.  Review of the MIP images confirms the above findings. CTA ABDOMEN AND PELVIS FINDINGS VASCULAR Aorta: Moderate severity calcification of a normal caliber aorta without aneurysm, dissection, vasculitis or significant stenosis. Celiac: Patent without evidence of aneurysm, dissection, vasculitis or significant stenosis. SMA: Patent SMA stent without evidence of aneurysm, dissection, vasculitis or significant stenosis. Renals: Moderate-severity calcification. Both renal arteries are patent without evidence of aneurysm, dissection, vasculitis, fibromuscular dysplasia or hemodynamically significant stenosis. IMA: Patent without evidence of aneurysm, dissection, vasculitis or significant stenosis. Inflow: Moderate to marked severity calcification without evidence of aneurysm, dissection, vasculitis or hemodynamically significant stenosis. Veins: No obvious venous abnormality within the limitations of this arterial phase study. Review of the MIP images confirms the above findings. NON-VASCULAR Hepatobiliary: No focal liver abnormality is seen. Status post cholecystectomy. No biliary dilatation. Pancreas: Unremarkable. No pancreatic ductal dilatation or surrounding inflammatory changes. Spleen: Normal in size without focal abnormality. Adrenals/Urinary Tract: The right adrenal gland is normal in appearance. A stable 1.3 cm x 0.9 cm isodense left adrenal mass is noted. Kidneys are normal, without renal calculi, focal lesion, or hydronephrosis. Bladder is unremarkable. Stomach/Bowel: The there is a small hiatal hernia. Appendix appears normal. No evidence of bowel wall thickening, distention, or inflammatory changes. Noninflamed diverticula are seen throughout the sigmoid colon. Lymphatic: No abnormal abdominal or pelvic lymph nodes are identified. Reproductive: A 1.2 cm x 0.8 cm calcified uterine fibroid is seen. The bilateral adnexa are unremarkable. Other: No abdominal wall hernia or abnormality. No abdominopelvic ascites.  Musculoskeletal: Degenerative changes seen throughout the lumbar spine. Review of the MIP images confirms the above findings. IMPRESSION: 1. No evidence of pulmonary embolus. 2. No acute abnormalities within the chest, abdomen or pelvis. 3. Stable left adrenal mass which may represent an adrenal adenoma. 4. Small hiatal hernia. 5. Noninflamed sigmoid diverticulosis. Aortic Atherosclerosis (ICD10-I70.0). Electronically Signed   By: Virgina Norfolk M.D.   On: 03/25/2020 18:11  ____________________________________________   PROCEDURES .Critical Care Performed by: Carrie Mew, MD Authorized by: Carrie Mew, MD   Critical care provider statement:    Critical care time (minutes):  35   Critical care time was exclusive of:  Separately billable procedures and treating other patients   Critical care was necessary to treat or prevent imminent or life-threatening deterioration of the following conditions:  Circulatory failure   Critical care was time spent personally by me on the following activities:  Development of treatment plan with patient or surrogate, discussions with consultants, evaluation of patient's response to treatment, examination of patient, obtaining history from patient or surrogate, ordering and performing treatments and interventions, ordering and review of laboratory studies, ordering and review of radiographic studies, pulse oximetry, re-evaluation of patient's condition and review of old charts    ____________________________________________  DIFFERENTIAL DIAGNOSIS   Symptomatic hypertension, non-STEMI, aortic dissection, aortic aneurysm  CLINICAL IMPRESSION / ASSESSMENT AND PLAN / ED COURSE  Medications ordered in the ED: Medications  sodium chloride flush (NS) 0.9 % injection 3 mL (has no administration in time range)  sodium chloride 0.9 % bolus 500 mL (500 mLs Intravenous New Bag/Given 03/25/20 1731)  hydrALAZINE (APRESOLINE) injection 5 mg (5 mg  Intravenous Given 03/25/20 1728)  iohexol (OMNIPAQUE) 350 MG/ML injection 75 mL (75 mLs Intravenous Contrast Given 03/25/20 1745)    Pertinent labs & imaging results that were available during my care of the patient were reviewed by me and considered in my medical decision making (see chart for details).  Lynea Rollison was evaluated in Emergency Department on 03/25/2020 for the symptoms described in the history of present illness. She was evaluated in the context of the global COVID-19 pandemic, which necessitated consideration that the patient might be at risk for infection with the SARS-CoV-2 virus that causes COVID-19. Institutional protocols and algorithms that pertain to the evaluation of patients at risk for COVID-19 are in a state of rapid change based on information released by regulatory bodies including the CDC and federal and state organizations. These policies and algorithms were followed during the patient's care in the ED.   Patient presents with radiating chest pain that is also exertional, severe hypertension.  EKG shows some new slight lateral ST depression which could be ischemic or afterload related.  Initial labs including troponin are unremarkable.  CT angiogram of the chest abdomen pelvis unremarkable without evidence of dissection or aneurysm or PE.   ----------------------------------------- 7:00 PM on 03/25/2020 -----------------------------------------   Patient given IV hydralazine with improvement of blood pressure to 185/94.  Chest pain improved.  Case discussed with hospitalist for further evaluation and management.      ____________________________________________   FINAL CLINICAL IMPRESSION(S) / ED DIAGNOSES    Final diagnoses:  Hypertensive urgency  Nonspecific chest pain     ED Discharge Orders    None      Portions of this note were generated with dragon dictation software. Dictation errors may occur despite best attempts at  proofreading.   Carrie Mew, MD 03/25/20 1904

## 2020-03-25 NOTE — ED Notes (Signed)
Pt up to bathroom with assistance.  Family with pt.  Pt alert.

## 2020-03-25 NOTE — ED Notes (Signed)
Report off to paige rn.  Pt moved to cpod.

## 2020-03-25 NOTE — ED Triage Notes (Signed)
Pt presents to ED via POV with c/o intermittent CP that has been going on "for a while". Pt states "usually it's under my sternum, today it started in my R ribcage and then went to my sternum". Pt states pain is a tightness at this time.   Pt states pain has decreased and is just a "subtle thing under my R ribs right now". Pt A&O x4, ambulatory with steady gait, NAD noted.

## 2020-03-26 DIAGNOSIS — K5730 Diverticulosis of large intestine without perforation or abscess without bleeding: Secondary | ICD-10-CM | POA: Diagnosis present

## 2020-03-26 DIAGNOSIS — N182 Chronic kidney disease, stage 2 (mild): Secondary | ICD-10-CM | POA: Diagnosis present

## 2020-03-26 DIAGNOSIS — M810 Age-related osteoporosis without current pathological fracture: Secondary | ICD-10-CM | POA: Diagnosis present

## 2020-03-26 DIAGNOSIS — E785 Hyperlipidemia, unspecified: Secondary | ICD-10-CM | POA: Diagnosis present

## 2020-03-26 DIAGNOSIS — M069 Rheumatoid arthritis, unspecified: Secondary | ICD-10-CM | POA: Diagnosis present

## 2020-03-26 DIAGNOSIS — I2089 Other forms of angina pectoris: Secondary | ICD-10-CM | POA: Diagnosis present

## 2020-03-26 DIAGNOSIS — E279 Disorder of adrenal gland, unspecified: Secondary | ICD-10-CM | POA: Diagnosis present

## 2020-03-26 DIAGNOSIS — I160 Hypertensive urgency: Secondary | ICD-10-CM | POA: Diagnosis present

## 2020-03-26 DIAGNOSIS — G8929 Other chronic pain: Secondary | ICD-10-CM | POA: Diagnosis present

## 2020-03-26 DIAGNOSIS — K449 Diaphragmatic hernia without obstruction or gangrene: Secondary | ICD-10-CM | POA: Diagnosis present

## 2020-03-26 DIAGNOSIS — K5790 Diverticulosis of intestine, part unspecified, without perforation or abscess without bleeding: Secondary | ICD-10-CM | POA: Diagnosis present

## 2020-03-26 DIAGNOSIS — Z79899 Other long term (current) drug therapy: Secondary | ICD-10-CM | POA: Diagnosis not present

## 2020-03-26 DIAGNOSIS — M545 Low back pain: Secondary | ICD-10-CM | POA: Diagnosis present

## 2020-03-26 DIAGNOSIS — M1990 Unspecified osteoarthritis, unspecified site: Secondary | ICD-10-CM | POA: Diagnosis present

## 2020-03-26 DIAGNOSIS — I129 Hypertensive chronic kidney disease with stage 1 through stage 4 chronic kidney disease, or unspecified chronic kidney disease: Secondary | ICD-10-CM | POA: Diagnosis present

## 2020-03-26 DIAGNOSIS — R079 Chest pain, unspecified: Secondary | ICD-10-CM

## 2020-03-26 DIAGNOSIS — Z20822 Contact with and (suspected) exposure to covid-19: Secondary | ICD-10-CM | POA: Diagnosis present

## 2020-03-26 DIAGNOSIS — Z833 Family history of diabetes mellitus: Secondary | ICD-10-CM | POA: Diagnosis not present

## 2020-03-26 DIAGNOSIS — Z8249 Family history of ischemic heart disease and other diseases of the circulatory system: Secondary | ICD-10-CM | POA: Diagnosis not present

## 2020-03-26 DIAGNOSIS — Z66 Do not resuscitate: Secondary | ICD-10-CM | POA: Diagnosis present

## 2020-03-26 DIAGNOSIS — I25118 Atherosclerotic heart disease of native coronary artery with other forms of angina pectoris: Secondary | ICD-10-CM | POA: Diagnosis present

## 2020-03-26 DIAGNOSIS — K219 Gastro-esophageal reflux disease without esophagitis: Secondary | ICD-10-CM | POA: Diagnosis present

## 2020-03-26 DIAGNOSIS — I7300 Raynaud's syndrome without gangrene: Secondary | ICD-10-CM | POA: Diagnosis present

## 2020-03-26 DIAGNOSIS — Z841 Family history of disorders of kidney and ureter: Secondary | ICD-10-CM | POA: Diagnosis not present

## 2020-03-26 DIAGNOSIS — I208 Other forms of angina pectoris: Secondary | ICD-10-CM | POA: Diagnosis present

## 2020-03-26 DIAGNOSIS — I700 Atherosclerosis of aorta: Secondary | ICD-10-CM | POA: Diagnosis present

## 2020-03-26 DIAGNOSIS — J449 Chronic obstructive pulmonary disease, unspecified: Secondary | ICD-10-CM | POA: Diagnosis present

## 2020-03-26 LAB — BASIC METABOLIC PANEL
Anion gap: 8 (ref 5–15)
BUN: 15 mg/dL (ref 8–23)
CO2: 26 mmol/L (ref 22–32)
Calcium: 8.9 mg/dL (ref 8.9–10.3)
Chloride: 104 mmol/L (ref 98–111)
Creatinine, Ser: 0.46 mg/dL (ref 0.44–1.00)
GFR calc Af Amer: >60 mL/min (ref >60)
GFR calc non Af Amer: >60 mL/min (ref >60)
Glucose, Bld: 98 mg/dL (ref 70–99)
Potassium: 3.8 mmol/L (ref 3.5–5.1)
Sodium: 138 mmol/L (ref 135–145)

## 2020-03-26 LAB — ECHOCARDIOGRAM COMPLETE
Height: 60 in
Weight: 1632 oz

## 2020-03-26 LAB — TROPONIN I (HIGH SENSITIVITY): Troponin I (High Sensitivity): 19 ng/L — ABNORMAL HIGH (ref <18)

## 2020-03-26 LAB — CBC
HCT: 43.9 % (ref 36.0–46.0)
Hemoglobin: 14.3 g/dL (ref 12.0–15.0)
MCH: 29.2 pg (ref 26.0–34.0)
MCHC: 32.6 g/dL (ref 30.0–36.0)
MCV: 89.6 fL (ref 80.0–100.0)
Platelets: 194 10*3/uL (ref 150–400)
RBC: 4.9 MIL/uL (ref 3.87–5.11)
RDW: 13.5 % (ref 11.5–15.5)
WBC: 5.1 10*3/uL (ref 4.0–10.5)
nRBC: 0 % (ref 0.0–0.2)

## 2020-03-26 MED ORDER — DOCUSATE SODIUM 100 MG PO CAPS
200.0000 mg | ORAL_CAPSULE | Freq: Every day | ORAL | Status: DC | PRN
  Administered 2020-03-26: 200 mg via ORAL
  Filled 2020-03-26: qty 2

## 2020-03-26 NOTE — ED Notes (Signed)
Pt assisted to restroom to urinate. Pt washed hands and laid back in bed. Provided pt with her dentures. Daughter at bedside.

## 2020-03-26 NOTE — ED Notes (Signed)
Dr. Patel at bedside 

## 2020-03-26 NOTE — Discharge Summary (Signed)
[facility] Discharge Summary   Patient: [name] FYB:[id number]  PCP: Crecencio Mc, MD  Date of admission: 03/25/2020   Date of discharge: 03/27/2020 03/27/2020    Discharge Diagnoses:  Principal Problem:   Chest pain Active Problems:   Hyperlipidemia   Accelerated hypertension   Angina of effort [facility])  Admitted From: Home Disposition:  Home   Recommendations for Outpatient Follow-up:  1. PCP: Follow-up with PCP in 1 week 2. Follow up LABS/TEST: None  Follow-up Information    Crecencio Mc, MD On 04/03/2020.   Specialty: Internal Medicine Why: 9:00am Contact information: [place] [place] [address] [phone]        Corey Skains, MD. Go on 04/08/2020.   Specialty: Cardiology Why: 2:15pm Contact information: [facility] [place] [address] [phone]          Diet recommendation: Cardiac diet  Activity: The patient is advised to gradually reintroduce usual activities, as tolerated  Discharge Condition: stable  Code Status: DNR   History of present illness: As per the H and P dictated on admission, "[name] is a 86 y.o. female with medical history significant for COPD, hyperlipidemia, RA, GERD, chronic lumbar back pain who presents to the ED for evaluation of chest pain.  Patient reports couple weeks of intermittent lower sternal and right lower chest pain.  She says she saw her PCP when symptoms initially began and it was felt due to indigestion.  She has been taking Protonix and over-the-counter indigestion medications.  However, she has had continued symptoms and describes a squeezing sensation at her lower sternum and at the same area of her back.  Pain would also radiate from beneath her right rib cage down to her upper hip area.    Symptoms worsen with exertion and she did have an episode of lightheadedness when she walked up a flight of stairs.  She did  not fall or lose consciousness.  She has had some associated dyspnea.  She has had occasional nausea without emesis.  She denies any difficulty with swallowing foods or liquids.  She otherwise denies any subjective fevers, chills, diaphoresis, abdominal pain, vomiting, dysuria.  She denies any similar episode in the past.  She says her only current regular medications are aspirin 81 mg daily, atorvastatin, and pantoprazole.  She reports a previous normal stress test in the 1980s.  Patient is very functional at baseline.  She lives with her daughter.  She ambulates on her own power and routinely does yard work."  Hospital Course:  Summary of her active problems in the hospital is as following. Chest pain. Hyperlipidemia EKG without any significant ischemic changes. Troponin x4 - for any acute ischemia. CTA chest abdomen pelvis negative for PE or any other acute dissection. Continue aspirin, continue Lipitor. Cardiology was consulted. Outpatient stress test will be arranged. Currently chest pain-free. No further work-up.  Accelerated hypertension. Blood pressure still remains elevated. Does not take any medication at home. Since the patient presented with chest pain and has evidence of PVD we will add Imdur, patient reports dizziness therefore we will reduce the dose of Imdur to 50 mg daily. Also Toprol-XL 12.5 mg daily instead of metoprolol 12.5 mg daily due to same symptoms. Outpatient follow-up with PCP. Consider adding Norvasc in the follow-up if the blood pressure remains elevated.  Patient was seen by physical therapy, who recommended no therapy needed on discharge On the day of the discharge the  patient's vitals were stable, and no other acute medical condition were reported by patient. the patient was felt safe to be discharge at Home with no therapy needed on discharge.  Consultants: Cardiology  Procedures: Echocardiogram   Discharge Exam: General: Appear in no distress, no  Rash; Oral Mucosa Clear, moist. Cardiovascular: S1 and S2 Present, no Murmur, Respiratory: normal respiratory effort, Bilateral Air entry present and no Crackles, no wheezes Abdomen: Bowel Sound present, Soft and no tenderness, no hernia Extremities: no Pedal edema, no calf tenderness Neurology: alert and oriented to time, place, and person affect appropriate.  Filed Weights   03/25/20 1135 03/26/20 1626 03/27/20 0353  Weight: 46.3 kg 45 kg 45 kg   Vitals:   03/27/20 0905 03/27/20 1207  BP:  123/61  Pulse: 63 61  Resp:    Temp:    SpO2:  99%    DISCHARGE MEDICATION: Allergies as of 03/27/2020      Reactions   Ibandronic Acid Other (See Comments)   Achy all over. Flu like S/S   Other Other (See Comments)   Achy all over. Flu like S/S Dysphagia   Prednisone Rash   Actonel [risedronate] Other (See Comments)   Dysphagia   Raloxifene Other (See Comments)   Mood swings   Versed [midazolam] Other (See Comments)   Difficult waking up and memory loss   Doxycycline Rash      Medication List    TAKE these medications   acetaminophen 500 MG tablet Commonly known as: TYLENOL Take 500 mg by mouth every 6 (six) hours as needed for mild pain.   Alfalfa 500 MG Tabs Take 500 mg by mouth.   aspirin 81 MG EC tablet Take 1 tablet (81 mg total) by mouth every evening.   atorvastatin 10 MG tablet Commonly known as: LIPITOR Take 1 tablet (10 mg total) by mouth every evening.   Calcium Carbonate-Vitamin D 600-200 MG-UNIT Caps Take by mouth.   CINNAMON PO Take 1,000 mg by mouth daily.   Claritin 10 MG tablet Generic drug: loratadine Take 10 mg by mouth daily as needed for allergies.   clotrimazole-betamethasone cream Commonly known as: LOTRISONE Apply topically 2 (two) times daily.   co-enzyme Q-10 30 MG capsule Take 30 mg by mouth daily.   diclofenac sodium 1 % Gel Commonly known as: VOLTAREN   fluticasone 50 MCG/ACT nasal spray Commonly known as: FLONASE Place 1  spray into both nostrils daily.   Glucosamine Sulfate 500 MG Caps Take 500 mg by mouth daily.   GNP GINGKO BILOBA EXTRACT PO Take 1 tablet by mouth daily.   Hydrocortisone Ace-Pramoxine 2.5-1 % Crea Apply topically as needed.   isosorbide mononitrate 30 MG 24 hr tablet Commonly known as: IMDUR Take 0.5 tablets (15 mg total) by mouth daily.   Magnesium 400 MG Caps Take 400 mg by mouth daily.   metoprolol succinate 25 MG 24 hr tablet Commonly known as: Toprol XL Take 0.5 tablets (12.5 mg total) by mouth daily.   multivitamin capsule Take 1 capsule by mouth daily.   OMEGA 3 PO Take 520 mg by mouth daily.   pantoprazole 40 MG tablet Commonly known as: PROTONIX Take 1 tablet (40 mg total) by mouth daily.   PROBIOTIC ACIDOPHILUS PO Take 1 capsule by mouth daily.   Turmeric 450 MG Caps Take 450 Units by mouth.   vitamin B-12 1000 MCG tablet Commonly known as: CYANOCOBALAMIN Take 1,000 mcg by mouth daily.   Vitamin C CR 500 MG Cpcr Take  500 mg by mouth daily.   Vitamin D3 75 MCG (3000 UT) Tabs Take 3,000 Units by mouth 2 (two) times daily.      Allergies  Allergen Reactions  . Ibandronic Acid Other (See Comments)    Achy all over. Flu like S/S  . Other Other (See Comments)    Achy all over. Flu like S/S Dysphagia  . Prednisone Rash  . Actonel [Risedronate] Other (See Comments)    Dysphagia  . Raloxifene Other (See Comments)    Mood swings  . Versed [Midazolam] Other (See Comments)    Difficult waking up and memory loss  . Doxycycline Rash   Discharge Instructions    Diet - low sodium heart healthy   Complete by: As directed    Increase activity slowly   Complete by: As directed      The results of significant diagnostics from this hospitalization (including imaging, microbiology, ancillary and laboratory) are listed below for reference.    Significant Diagnostic Studies: DG Chest 2 View  Result Date: 03/25/2020 CLINICAL DATA:  Right-sided chest  pain EXAM: CHEST - 2 VIEW COMPARISON:  03/12/2020 FINDINGS: Cardiac shadows within normal limits. Aortic calcifications are again seen. Lungs are hyperinflated bilaterally consistent with COPD. Chronic compression deformity in the lower thoracic spine is noted. Multilevel degenerative changes are seen. IMPRESSION: COPD without acute abnormality. Electronically Signed   By: Inez Catalina M.D.   On: 03/25/2020 12:09   DG Chest 2 View  Result Date: 03/13/2020 CLINICAL DATA:  Shortness of breath.  Chest pain. EXAM: CHEST - 2 VIEW COMPARISON:  01/17/2019. FINDINGS: Mediastinum and hilar structures normal. Lungs are clear. No pleural effusion or pneumothorax. Heart size normal. Old right rib fractures again noted. Stable mild lower thoracic compression fracture. Degenerative changes both shoulders. Loose bodies about the left shoulder may be present. IMPRESSION: No acute cardiopulmonary disease. Electronically Signed   By: Marcello Moores  Register   On: 03/13/2020 07:41   ECHOCARDIOGRAM COMPLETE  Result Date: 03/26/2020    ECHOCARDIOGRAM REPORT   Patient Name:   Abbye Ardean Larsen Date of Exam: 03/25/2020 Medical Rec #:  [id number]           Height:       60.0 in Accession #:    [id number]          Weight:       102.0 lb Date of Birth:  10-Apr-1927          BSA:          1.402 m Patient Age:    86 years            BP:           112/68 mmHg Patient Gender: F                   HR:           67 bpm. Exam Location:  [facility] Procedure: 2D Echo, Cardiac Doppler and Color Doppler Indications:     R07.9 Chest pain  History:         Patient has no prior history of Echocardiogram examinations.                  Risk Factors:Dyslipidemia. Murmur. Chronic kidney disease.  Sonographer:     Wilford Sports Rodgers-Jones Referring Phys:  [id number] Lewis Diagnosing Phys: Serafina Royals MD IMPRESSIONS  1. Left ventricular ejection fraction, by estimation, is 60 to 65%. The left ventricle has normal function. The left  ventricle has no regional  wall motion abnormalities. Left ventricular diastolic parameters were normal.  2. Right ventricular systolic function is normal. The right ventricular size is normal.  3. The mitral valve is normal in structure. Mild mitral valve regurgitation.  4. The aortic valve is normal in structure. Aortic valve regurgitation is trivial. FINDINGS  Left Ventricle: Left ventricular ejection fraction, by estimation, is 60 to 65%. The left ventricle has normal function. The left ventricle has no regional wall motion abnormalities. The left ventricular internal cavity size was normal in size. There is  no left ventricular hypertrophy. Left ventricular diastolic parameters were normal. Right Ventricle: The right ventricular size is normal. No increase in right ventricular wall thickness. Right ventricular systolic function is normal. Left Atrium: Left atrial size was normal in size. Right Atrium: Right atrial size was normal in size. Pericardium: There is no evidence of pericardial effusion. Mitral Valve: The mitral valve is normal in structure. Mild mitral valve regurgitation. Tricuspid Valve: The tricuspid valve is normal in structure. Tricuspid valve regurgitation is trivial. Aortic Valve: The aortic valve is normal in structure. Aortic valve regurgitation is trivial. Pulmonic Valve: The pulmonic valve was normal in structure. Pulmonic valve regurgitation is not visualized. Aorta: The aortic root and ascending aorta are structurally normal, with no evidence of dilitation. IAS/Shunts: No atrial level shunt detected by color flow Doppler.  LEFT VENTRICLE PLAX 2D LVIDd:         3.96 cm  Diastology LVIDs:         2.54 cm  LV e' lateral:   6.85 cm/s LV PW:         1.07 cm  LV E/e' lateral: 10.4 LV IVS:        1.06 cm  LV e' medial:    4.24 cm/s LVOT diam:     2.10 cm  LV E/e' medial:  16.8 LV SV:         79 LV SV Index:   56 LVOT Area:     3.46 cm  RIGHT VENTRICLE RV Basal diam:  3.25 cm RV S prime:     16.00 cm/s TAPSE (M-mode): 2.4  cm LEFT ATRIUM             Index       RIGHT ATRIUM           Index LA diam:        3.50 cm 2.50 cm/m  RA Area:     13.30 cm LA Vol (A2C):   45.2 ml 32.24 ml/m RA Volume:   35.50 ml  25.32 ml/m LA Vol (A4C):   28.0 ml 19.97 ml/m LA Biplane Vol: 35.9 ml 25.60 ml/m  AORTIC VALVE LVOT Vmax:   113.00 cm/s LVOT Vmean:  79.100 cm/s LVOT VTI:    0.228 m  AORTA Ao Root diam: 3.10 cm MITRAL VALVE MV Area (PHT): 2.22 cm     SHUNTS MV Decel Time: 342 msec     Systemic VTI:  0.23 m MV E velocity: 71.10 cm/s   Systemic Diam: 2.10 cm MV A velocity: 108.00 cm/s MV E/A ratio:  0.66 Serafina Royals MD Electronically signed by Serafina Royals MD Signature Date/Time: 03/26/2020/5:20:33 PM    Final    CT Angio Chest/Abd/Pel for Dissection W and/or Wo Contrast  Result Date: 03/25/2020 CLINICAL DATA:  Intermittent chest pain. EXAM: CT ANGIOGRAPHY CHEST, ABDOMEN AND PELVIS TECHNIQUE: Non-contrast CT of the chest was initially obtained. Multidetector CT imaging through the chest, abdomen and pelvis  was performed using the standard protocol during bolus administration of intravenous contrast. Multiplanar reconstructed images and MIPs were obtained and reviewed to evaluate the vascular anatomy. CONTRAST:  65mL OMNIPAQUE IOHEXOL 350 MG/ML SOLN COMPARISON:  July 24, 2019 FINDINGS: CTA CHEST FINDINGS Cardiovascular: Satisfactory opacification of the pulmonary arteries to the segmental level. No evidence of pulmonary embolism. Normal heart size. No pericardial effusion. Mediastinum/Nodes: No enlarged mediastinal, hilar, or axillary lymph nodes. Thyroid gland, trachea, and esophagus demonstrate no significant findings. Lungs/Pleura: Lungs are clear. No pleural effusion or pneumothorax. Musculoskeletal: Third, fourth and fifth chronic lateral right rib fractures are seen. Multilevel degenerative changes seen throughout the thoracic spine. Review of the MIP images confirms the above findings. CTA ABDOMEN AND PELVIS FINDINGS VASCULAR  Aorta: Moderate severity calcification of a normal caliber aorta without aneurysm, dissection, vasculitis or significant stenosis. Celiac: Patent without evidence of aneurysm, dissection, vasculitis or significant stenosis. SMA: Patent SMA stent without evidence of aneurysm, dissection, vasculitis or significant stenosis. Renals: Moderate-severity calcification. Both renal arteries are patent without evidence of aneurysm, dissection, vasculitis, fibromuscular dysplasia or hemodynamically significant stenosis. IMA: Patent without evidence of aneurysm, dissection, vasculitis or significant stenosis. Inflow: Moderate to marked severity calcification without evidence of aneurysm, dissection, vasculitis or hemodynamically significant stenosis. Veins: No obvious venous abnormality within the limitations of this arterial phase study. Review of the MIP images confirms the above findings. NON-VASCULAR Hepatobiliary: No focal liver abnormality is seen. Status post cholecystectomy. No biliary dilatation. Pancreas: Unremarkable. No pancreatic ductal dilatation or surrounding inflammatory changes. Spleen: Normal in size without focal abnormality. Adrenals/Urinary Tract: The right adrenal gland is normal in appearance. A stable 1.3 cm x 0.9 cm isodense left adrenal mass is noted. Kidneys are normal, without renal calculi, focal lesion, or hydronephrosis. Bladder is unremarkable. Stomach/Bowel: The there is a small hiatal hernia. Appendix appears normal. No evidence of bowel wall thickening, distention, or inflammatory changes. Noninflamed diverticula are seen throughout the sigmoid colon. Lymphatic: No abnormal abdominal or pelvic lymph nodes are identified. Reproductive: A 1.2 cm x 0.8 cm calcified uterine fibroid is seen. The bilateral adnexa are unremarkable. Other: No abdominal wall hernia or abnormality. No abdominopelvic ascites. Musculoskeletal: Degenerative changes seen throughout the lumbar spine. Review of the MIP  images confirms the above findings. IMPRESSION: 1. No evidence of pulmonary embolus. 2. No acute abnormalities within the chest, abdomen or pelvis. 3. Stable left adrenal mass which may represent an adrenal adenoma. 4. Small hiatal hernia. 5. Noninflamed sigmoid diverticulosis. Aortic Atherosclerosis (ICD10-I70.0). Electronically Signed   By: Virgina Norfolk M.D.   On: 03/25/2020 18:11   Microbiology: Recent Results (from the past 240 hour(s))  SARS Coronavirus 2 by RT PCR (hospital order, performed in [facility] hospital lab) Nasopharyngeal Nasopharyngeal Swab     Status: None   Collection Time: 03/25/20  5:25 PM   Specimen: Nasopharyngeal Swab  Result Value Ref Range Status   SARS Coronavirus 2 NEGATIVE NEGATIVE Final    Comment: (NOTE) SARS-CoV-2 target nucleic acids are NOT DETECTED. The SARS-CoV-2 RNA is generally detectable in upper and lower respiratory specimens during the acute phase of infection. The lowest concentration of SARS-CoV-2 viral copies this assay can detect is 250 copies / mL. A negative result does not preclude SARS-CoV-2 infection and should not be used as the sole basis for treatment or other patient management decisions.  A negative result may occur with improper specimen collection / handling, submission of specimen other than nasopharyngeal swab, presence of viral mutation(s) within the areas targeted by  this assay, and inadequate number of viral copies (<250 copies / mL). A negative result must be combined with clinical observations, patient history, and epidemiological information. Fact Sheet for Patients:   StrictlyIdeas.no Fact Sheet for Healthcare Providers: BankingDealers.co.za This test is not yet approved or cleared  by the [place] FDA and has been authorized for detection and/or diagnosis of SARS-CoV-2 by FDA under an Emergency Use Authorization (EUA).  This EUA will remain in effect (meaning this  test can be used) for the duration of the COVID-19 declaration under Section 564(b)(1) of the Act, 21 U.S.C. section 360bbb-3(b)(1), unless the authorization is terminated or revoked sooner. Performed at [facility], [address] [name], [place] [address]    Labs: CBC: Recent Labs  Lab 03/25/20 1138 03/26/20 0433 03/27/20 0458  WBC 5.8 5.1 5.2  HGB 14.2 14.3 14.0  HCT 43.4 43.9 42.1  MCV 90.0 89.6 89.0  PLT 208 194 124   Basic Metabolic Panel: Recent Labs  Lab 03/25/20 1138 03/26/20 0433 03/27/20 0458  NA 138 138 139  K 4.1 3.8 4.0  CL 103 104 106  CO2 28 26 25   GLUCOSE 95 98 104*  BUN 15 15 21   CREATININE 0.57 0.46 0.59  CALCIUM 9.1 8.9 8.9   Liver Function Tests: Recent Labs  Lab 03/25/20 1138  AST 49*  ALT 24  ALKPHOS 52  BILITOT 0.7  PROT 7.5  ALBUMIN 4.4   No results for input(s): LIPASE, AMYLASE in the last 168 hours. No results for input(s): AMMONIA in the last 168 hours. Cardiac Enzymes: No results for input(s): CKTOTAL, CKMB, CKMBINDEX, TROPONINI in the last 168 hours. BNP (last 3 results) No results for input(s): BNP in the last 8760 hours. CBG: No results for input(s): GLUCAP in the last 168 hours.  Time spent: 35 minutes  Signed:  Berle Mull  [facility] 03/27/2020 5:13 PM

## 2020-03-26 NOTE — ED Notes (Signed)
Upon movement pt became hypertensive. Called pharmacy to obtain oral hydralazine as per ordered.  (please note this is back charted) By the time of medication arrival, BP back below order parameters.

## 2020-03-26 NOTE — ED Notes (Signed)
Pr provided [place] sandwich tray and ginger ale. Sitting up on stretcher. Daughter at bedside.

## 2020-03-26 NOTE — Progress Notes (Signed)
[facility] Progress Note  Patient: [name]    FBP:[id number]  DOA: 03/25/2020     Date of Service: the patient was seen and examined on 03/26/2020  Chief Complaint  Patient presents with  . Chest Pain  . Dizziness   Brief hospital course: PMH of COPD, hyperlipidemia, RA, GERD, chronic lumbar back pain.  Presented with chest pain both at rest as well as on exertion. Currently further plan is follow cardiology recommendation.  Assessment and Plan: 1.  Chest pain. Atypical in nature. EKG negative.  Troponins negative. Prior history of PVD.  Plan continue aspirin, Lipitor. Follow-up on echocardiogram results still pending. Cardiology consulted. Currently they are considering inpatient stress test versus cardiac catheterization.  2.  Accelerated hypertension. Blood pressure stable. Continue current regimen.  3.  Hyperlipidemia Continue statin  4.  Generalized body aches Patient reports multiple areas of pain on and off. Recommended outpatient follow-up with PCP for that.  Diet: Cardiac diet DVT Prophylaxis: Subcutaneous Heparin    Advance goals of care discussion: DNR  Family Communication: family was present at bedside, at the time of interview.  The pt provided permission to discuss medical plan with the family. Opportunity was given to ask question and all questions were answered satisfactorily.   Disposition:  Status is: Inpatient  Remains inpatient appropriate because:Ongoing diagnostic testing needed not appropriate for outpatient work up   Dispo: The patient is from: Home              Anticipated d/c is to: Home              Anticipated d/c date is: 1 day              Patient currently is not medically stable to d/c.         Subjective: No chest pain right now.  No dizziness right now.  No nausea no vomiting.  Has some mild shortness of breath.  Physical Exam: General:  alert oriented to time, place, and person.  Appear in mild  distress, affect appropriate Eyes: PERRL ENT: Oral Mucosa Clear, moist  Neck: no JVD,  Cardiovascular: S1 and S2 Present, no Murmur,  Respiratory: good respiratory effort, Bilateral Air entry equal and Decreased, no Crackles, no wheezes Abdomen: Bowel Sound present, Soft and no tenderness,  Skin: no rash Extremities: no Pedal edema, no calf tenderness Neurologic: without any new focal findings Gait not checked due to patient safety concerns  Vitals:   03/26/20 1500 03/26/20 1600 03/26/20 1626 03/26/20 1630  BP: (!) 150/115 (!) 115/50 (!) 141/59   Pulse: 75 (!) 52 (!) 57   Resp:   16 18  Temp:   98.3 F (36.8 C)   TempSrc:      SpO2: 97% 96% 98%   Weight:   45 kg   Height:   5' (1.524 m)     Intake/Output Summary (Last 24 hours) at 03/26/2020 1932 Last data filed at 03/26/2020 1700 Gross per 24 hour  Intake 120 ml  Output --  Net 120 ml   Filed Weights   03/25/20 1135 03/26/20 1626  Weight: 46.3 kg 45 kg    Data Reviewed: I have personally reviewed and interpreted daily labs, tele strips, imagings as discussed above. I reviewed all nursing notes, pharmacy notes, vitals, pertinent old records I have discussed plan of care as described above with RN and patient/family.  CBC: Recent Labs  Lab 03/25/20 1138 03/26/20 0433  WBC 5.8 5.1  HGB 14.2  14.3  HCT 43.4 43.9  MCV 90.0 89.6  PLT 208 176   Basic Metabolic Panel: Recent Labs  Lab 03/25/20 1138 03/26/20 0433  NA 138 138  K 4.1 3.8  CL 103 104  CO2 28 26  GLUCOSE 95 98  BUN 15 15  CREATININE 0.57 0.46  CALCIUM 9.1 8.9    Studies: ECHOCARDIOGRAM COMPLETE  Result Date: 03/26/2020    ECHOCARDIOGRAM REPORT   Patient Name:   [name] Date of Exam: 03/25/2020 Medical Rec #:  [id number]           Height:       60.0 in Accession #:    [id number]          Weight:       102.0 lb Date of Birth:  11-Nov-1926          BSA:          1.402 m Patient Age:    55 years            BP:           112/68 mmHg  Patient Gender: F                   HR:           67 bpm. Exam Location:  [facility] Procedure: 2D Echo, Cardiac Doppler and Color Doppler Indications:     R07.9 Chest pain  History:         Patient has no prior history of Echocardiogram examinations.                  Risk Factors:Dyslipidemia. Murmur. Chronic kidney disease.  Sonographer:     Wilford Sports Rodgers-Jones Referring Phys:  [id number] [place] Diagnosing Phys: Serafina Royals MD IMPRESSIONS  1. Left ventricular ejection fraction, by estimation, is 60 to 65%. The left ventricle has normal function. The left ventricle has no regional wall motion abnormalities. Left ventricular diastolic parameters were normal.  2. Right ventricular systolic function is normal. The right ventricular size is normal.  3. The mitral valve is normal in structure. Mild mitral valve regurgitation.  4. The aortic valve is normal in structure. Aortic valve regurgitation is trivial. FINDINGS  Left Ventricle: Left ventricular ejection fraction, by estimation, is 60 to 65%. The left ventricle has normal function. The left ventricle has no regional wall motion abnormalities. The left ventricular internal cavity size was normal in size. There is  no left ventricular hypertrophy. Left ventricular diastolic parameters were normal. Right Ventricle: The right ventricular size is normal. No increase in right ventricular wall thickness. Right ventricular systolic function is normal. Left Atrium: Left atrial size was normal in size. Right Atrium: Right atrial size was normal in size. Pericardium: There is no evidence of pericardial effusion. Mitral Valve: The mitral valve is normal in structure. Mild mitral valve regurgitation. Tricuspid Valve: The tricuspid valve is normal in structure. Tricuspid valve regurgitation is trivial. Aortic Valve: The aortic valve is normal in structure. Aortic valve regurgitation is trivial. Pulmonic Valve: The pulmonic valve was normal in structure. Pulmonic valve  regurgitation is not visualized. Aorta: The aortic root and ascending aorta are structurally normal, with no evidence of dilitation. IAS/Shunts: No atrial level shunt detected by color flow Doppler.  LEFT VENTRICLE PLAX 2D LVIDd:         3.96 cm  Diastology LVIDs:         2.54 cm  LV e' lateral:   6.85 cm/s  LV PW:         1.07 cm  LV E/e' lateral: 10.4 LV IVS:        1.06 cm  LV e' medial:    4.24 cm/s LVOT diam:     2.10 cm  LV E/e' medial:  16.8 LV SV:         79 LV SV Index:   56 LVOT Area:     3.46 cm  RIGHT VENTRICLE RV Basal diam:  3.25 cm RV S prime:     16.00 cm/s TAPSE (M-mode): 2.4 cm LEFT ATRIUM             Index       RIGHT ATRIUM           Index LA diam:        3.50 cm 2.50 cm/m  RA Area:     13.30 cm LA Vol (A2C):   45.2 ml 32.24 ml/m RA Volume:   35.50 ml  25.32 ml/m LA Vol (A4C):   28.0 ml 19.97 ml/m LA Biplane Vol: 35.9 ml 25.60 ml/m  AORTIC VALVE LVOT Vmax:   113.00 cm/s LVOT Vmean:  79.100 cm/s LVOT VTI:    0.228 m  AORTA Ao Root diam: 3.10 cm MITRAL VALVE MV Area (PHT): 2.22 cm     SHUNTS MV Decel Time: 342 msec     Systemic VTI:  0.23 m MV E velocity: 71.10 cm/s   Systemic Diam: 2.10 cm MV A velocity: 108.00 cm/s MV E/A ratio:  0.66 Serafina Royals MD Electronically signed by Serafina Royals MD Signature Date/Time: 03/26/2020/5:20:33 PM    Final     Scheduled Meds: . aspirin EC  81 mg Oral QPM  . atorvastatin  10 mg Oral QPM  . enoxaparin (LOVENOX) injection  40 mg Subcutaneous Q24H  . metoprolol tartrate  12.5 mg Oral BID  . pantoprazole  40 mg Oral Daily  . sodium chloride flush  3 mL Intravenous Once   Continuous Infusions: PRN Meds: acetaminophen, hydrALAZINE, ondansetron (ZOFRAN) IV  Time spent: 35 minutes  Author: Berle Mull, MD [facility] 03/26/2020 7:32 PM  To reach On-call, see care teams to locate the attending and reach out to them via [email or web address]. If 7PM-7AM, please contact night-coverage If you still have difficulty reaching the attending  provider, please page the [facility] (Director on Call) for [facility] on amion for assistance.

## 2020-03-26 NOTE — Consult Note (Signed)
[facility] Cardiology Consultation Note  Patient ID: [name], MRN: [id number], DOB/AGE: 1926-11-17 86 y.o. Admit date: 03/25/2020   Date of Consult: 03/26/2020 Primary Physician: Crecencio Mc, MD Primary Cardiologist: Nehemiah Massed  Chief Complaint:  Chief Complaint  Patient presents with  . Chest Pain  . Dizziness   Reason for Consult: Chest pain  HPI: 86 y.o. female with known hypertension hyperlipidemia and coronary atherosclerosis on appropriate medication management and overall very physically active with new onset fatigue over the last several months and worsening episodes of substernal chest discomfort occurring when she wakes up in the middle night with symptoms.  This is crushing in the middle of her chest and lasting for about 5 to 10 minutes and relieved by rest associated with shortness of breath.  Second episode today did occur when she was walking up the steps with significant chest discomfort.  When seen in the emergency room she was completely free of chest discomfort and feeling much better.  EKG shows normal sinus rhythm with left ventricular hypertrophy troponin was 19.  Currently she is comfortable  Past Medical History:  Diagnosis Date  . Allergy   . Arthritis   . Atherosclerosis   . Atherosclerosis of abdominal aorta ([place])   . Chronic kidney disease    stage 2  . CKD (chronic kidney disease) stage 2, GFR 60-89 ml/min   . Collagen vascular disease ([place])   . Diverticulosis 2008  . GERD (gastroesophageal reflux disease)   . Heart murmur   . Hyperlipidemia   . Lumbar spinal stenosis   . Osteoporosis   . Ovarian lump   . RA (rheumatoid arthritis) ([place])   . Raynaud's disease   . Rheumatoid arthritis ([place])   . Spinal stenosis       Surgical History:  Past Surgical History:  Procedure Laterality Date  . BREAST SURGERY Left 1965   Benign biopsy  . CHOLECYSTECTOMY  2005   [facility]  . COLONOSCOPY  2008   Dr. Donnella Sham  . EXCISION OF BREAST  BIOPSY Left 1965   benign  . EYE SURGERY Bilateral    Cataract Extraction with IOL  . KYPHOPLASTY N/A 12/17/2016   Procedure: KYPHOPLASTY;  Surgeon: Hessie Knows, MD;  Location: [facility] ORS;  Service: Orthopedics;  Laterality: N/A;  l4  . LUMBAR LAMINECTOMY  05/09/2017   L2-L5  . VISCERAL ANGIOGRAPHY N/A 08/15/2017   Procedure: VISCERAL ANGIOGRAPHY;  Surgeon: Algernon Huxley, MD;  Location: [facility];  Service: Cardiovascular;  Laterality: N/A;  . VISCERAL ANGIOGRAPHY N/A 10/05/2017   Procedure: VISCERAL ANGIOGRAPHY;  Surgeon: Algernon Huxley, MD;  Location: [facility];  Service: Cardiovascular;  Laterality: N/A;  . VISCERAL ARTERY INTERVENTION N/A 08/15/2017   Procedure: VISCERAL ARTERY INTERVENTION;  Surgeon: Algernon Huxley, MD;  Location: [facility];  Service: Cardiovascular;  Laterality: N/A;     Home Meds: Prior to Admission medications   Medication Sig Start Date End Date Taking? Authorizing Provider  acetaminophen (TYLENOL) 500 MG tablet Take 500 mg by mouth every 6 (six) hours as needed for mild pain.   Yes [provider]  Alfalfa 500 MG TABS Take 500 mg by mouth.   Yes [provider]  Ascorbic Acid (VITAMIN C CR) 500 MG CPCR Take 500 mg by mouth daily.   Yes [provider]  atorvastatin (LIPITOR) 10 MG tablet Take 1 tablet (10 mg total) by mouth every evening. 12/21/19  Yes Crecencio Mc, MD  Calcium Carbonate-Vitamin D 600-200 MG-UNIT CAPS Take by mouth.   Yes [provider]  Cholecalciferol (VITAMIN D3) 3000 UNITS TABS Take 3,000 Units by mouth 2 (two) times daily.    Yes [provider]  CINNAMON PO Take 1,000 mg by mouth daily.    Yes [provider]  clotrimazole-betamethasone (LOTRISONE) cream Apply topically 2 (two) times daily. 03/14/19  Yes Poulose, Bethel Born, NP  co-enzyme Q-10 30 MG capsule Take 30 mg by mouth daily.    Yes [provider]  diclofenac sodium (VOLTAREN) 1 % GEL  08/02/19   Yes [provider]  fluticasone (FLONASE) 50 MCG/ACT nasal spray Place 1 spray into both nostrils daily.   Yes [provider]  Ginkgo Biloba (GNP GINGKO BILOBA EXTRACT PO) Take 1 tablet by mouth daily.   Yes [provider]  Glucosamine Sulfate 500 MG CAPS Take 500 mg by mouth daily.   Yes [provider]  Lactobacillus (PROBIOTIC ACIDOPHILUS PO) Take 1 capsule by mouth daily.    Yes [provider]  loratadine (CLARITIN) 10 MG tablet Take 10 mg by mouth daily as needed for allergies.   Yes [provider]  Magnesium 400 MG CAPS Take 400 mg by mouth daily.   Yes [provider]  Multiple Vitamin (MULTIVITAMIN) capsule Take 1 capsule by mouth daily.   Yes [provider]  Omega-3 Fatty Acids (OMEGA 3 PO) Take 520 mg by mouth daily.    Yes [provider]  pantoprazole (PROTONIX) 40 MG tablet Take 1 tablet (40 mg total) by mouth daily. 10/11/19  Yes Crecencio Mc, MD  Pramoxine-HC (HYDROCORTISONE ACE-PRAMOXINE) 2.5-1 % CREA Apply topically as needed.    Yes [provider]  Turmeric 450 MG CAPS Take 450 Units by mouth.   Yes [provider]  vitamin B-12 (CYANOCOBALAMIN) 1000 MCG tablet Take 1,000 mcg by mouth daily.   Yes [provider]    Inpatient Medications:  . aspirin EC  81 mg Oral QPM  . atorvastatin  10 mg Oral QPM  . enoxaparin (LOVENOX) injection  40 mg Subcutaneous Q24H  . metoprolol tartrate  12.5 mg Oral BID  . pantoprazole  40 mg Oral Daily  . sodium chloride flush  3 mL Intravenous Once     Allergies:  Allergies  Allergen Reactions  . Ibandronic Acid Other (See Comments)    Achy all over. Flu like S/S  . Other Other (See Comments)    Achy all over. Flu like S/S Dysphagia  . Prednisone Rash  . Actonel [Risedronate] Other (See Comments)    Dysphagia  . Raloxifene Other (See Comments)    Mood swings  . Versed [Midazolam] Other (See Comments)    Difficult  waking up and memory loss  . Doxycycline Rash    Social History   Socioeconomic History  . Marital status: Widowed    Spouse name: Gwyndolyn Saxon  . Number of children: 3  . Years of education: some college  . Highest education level: 12th grade  Occupational History    Employer: RETIRED  Tobacco Use  . Smoking status: Never Smoker  . Smokeless tobacco: Never Used  . Tobacco comment: smoking cessation materials not required  Substance and Sexual Activity  . Alcohol use: No    Alcohol/week: 0.0 standard drinks  . Drug use: No  . Sexual activity: Not Currently  Other Topics Concern  . Not on file  Social History Narrative  . Not on file  Social Determinants of Health   Financial Resource Strain:   . Difficulty of Paying Living Expenses:   Food Insecurity:   . Worried About Charity fundraiser in the Last Year:   . Arboriculturist in the Last Year:   Transportation Needs:   . Film/video editor (Medical):   [name] Lack of Transportation (Non-Medical):   Physical Activity: Sufficiently Active  . Days of Exercise per Week: 7 days  . Minutes of Exercise per Session: 30 min  Stress:   . Feeling of Stress :   Social Connections:   . Frequency of Communication with Friends and Family:   . Frequency of Social Gatherings with Friends and Family:   . Attends Religious Services:   . Active Member of Clubs or Organizations:   . Attends Archivist Meetings:   [name] Marital Status:   Intimate Partner Violence:   . Fear of Current or Ex-Partner:   . Emotionally Abused:   [name] Physically Abused:   . Sexually Abused:      Family History  Problem Relation Age of Onset  . Heart disease Mother   . Hypertension Father   . Heart disease Brother 23       heart attack  . Cancer Brother        bladder  . Diabetes Sister   . Alcohol abuse Sister   . Kidney disease Sister   . Diabetes Brother   . Stroke Brother   . Diabetes Maternal Grandmother   . Heart disease Maternal  Grandmother   . Cancer Brother        lung     Review of Systems Positive for chest pain Negative for: General:  chills, fever, night sweats or weight changes.  Cardiovascular: PND orthopnea syncope dizziness  Dermatological skin lesions rashes Respiratory: Cough congestion Urologic: Frequent urination urination at night and hematuria Abdominal: negative for nausea, vomiting, diarrhea, bright red blood per rectum, melena, or hematemesis Neurologic: negative for visual changes, and/or hearing changes  All other systems reviewed and are otherwise negative except as noted above.  Labs: No results for input(s): CKTOTAL, CKMB, TROPONINI in the last 72 hours. Lab Results  Component Value Date   WBC 5.1 03/26/2020   HGB 14.3 03/26/2020   HCT 43.9 03/26/2020   MCV 89.6 03/26/2020   PLT 194 03/26/2020    Recent Labs  Lab 03/25/20 1138 03/25/20 1138 03/26/20 0433  NA 138   < > 138  K 4.1   < > 3.8  CL 103   < > 104  CO2 28   < > 26  BUN 15   < > 15  CREATININE 0.57   < > 0.46  CALCIUM 9.1   < > 8.9  PROT 7.5  --   --   BILITOT 0.7  --   --   ALKPHOS 52  --   --   ALT 24  --   --   AST 49*  --   --   GLUCOSE 95   < > 98   < > = values in this interval not displayed.   Lab Results  Component Value Date   CHOL 158 01/01/2019   HDL 77 01/01/2019   LDLCALC 63 01/01/2019   TRIG 96 01/01/2019   No results found for: DDIMER  Radiology/Studies:  DG Chest 2 View  Result Date: 03/25/2020 CLINICAL DATA:  Right-sided chest pain EXAM: CHEST - 2 VIEW COMPARISON:  03/12/2020 FINDINGS: Cardiac shadows  within normal limits. Aortic calcifications are again seen. Lungs are hyperinflated bilaterally consistent with COPD. Chronic compression deformity in the lower thoracic spine is noted. Multilevel degenerative changes are seen. IMPRESSION: COPD without acute abnormality. Electronically Signed   By: Inez Catalina M.D.   On: 03/25/2020 12:09   DG Chest 2 View  Result Date:  03/13/2020 CLINICAL DATA:  Shortness of breath.  Chest pain. EXAM: CHEST - 2 VIEW COMPARISON:  01/17/2019. FINDINGS: Mediastinum and hilar structures normal. Lungs are clear. No pleural effusion or pneumothorax. Heart size normal. Old right rib fractures again noted. Stable mild lower thoracic compression fracture. Degenerative changes both shoulders. Loose bodies about the left shoulder may be present. IMPRESSION: No acute cardiopulmonary disease. Electronically Signed   By: Marcello Moores  Register   On: 03/13/2020 07:41   CT Angio Chest/Abd/Pel for Dissection W and/or Wo Contrast  Result Date: 03/25/2020 CLINICAL DATA:  Intermittent chest pain. EXAM: CT ANGIOGRAPHY CHEST, ABDOMEN AND PELVIS TECHNIQUE: Non-contrast CT of the chest was initially obtained. Multidetector CT imaging through the chest, abdomen and pelvis was performed using the standard protocol during bolus administration of intravenous contrast. Multiplanar reconstructed images and MIPs were obtained and reviewed to evaluate the vascular anatomy. CONTRAST:  30mL OMNIPAQUE IOHEXOL 350 MG/ML SOLN COMPARISON:  July 24, 2019 FINDINGS: CTA CHEST FINDINGS Cardiovascular: Satisfactory opacification of the pulmonary arteries to the segmental level. No evidence of pulmonary embolism. Normal heart size. No pericardial effusion. Mediastinum/Nodes: No enlarged mediastinal, hilar, or axillary lymph nodes. Thyroid gland, trachea, and esophagus demonstrate no significant findings. Lungs/Pleura: Lungs are clear. No pleural effusion or pneumothorax. Musculoskeletal: Third, fourth and fifth chronic lateral right rib fractures are seen. Multilevel degenerative changes seen throughout the thoracic spine. Review of the MIP images confirms the above findings. CTA ABDOMEN AND PELVIS FINDINGS VASCULAR Aorta: Moderate severity calcification of a normal caliber aorta without aneurysm, dissection, vasculitis or significant stenosis. Celiac: Patent without evidence of aneurysm,  dissection, vasculitis or significant stenosis. SMA: Patent SMA stent without evidence of aneurysm, dissection, vasculitis or significant stenosis. Renals: Moderate-severity calcification. Both renal arteries are patent without evidence of aneurysm, dissection, vasculitis, fibromuscular dysplasia or hemodynamically significant stenosis. IMA: Patent without evidence of aneurysm, dissection, vasculitis or significant stenosis. Inflow: Moderate to marked severity calcification without evidence of aneurysm, dissection, vasculitis or hemodynamically significant stenosis. Veins: No obvious venous abnormality within the limitations of this arterial phase study. Review of the MIP images confirms the above findings. NON-VASCULAR Hepatobiliary: No focal liver abnormality is seen. Status post cholecystectomy. No biliary dilatation. Pancreas: Unremarkable. No pancreatic ductal dilatation or surrounding inflammatory changes. Spleen: Normal in size without focal abnormality. Adrenals/Urinary Tract: The right adrenal gland is normal in appearance. A stable 1.3 cm x 0.9 cm isodense left adrenal mass is noted. Kidneys are normal, without renal calculi, focal lesion, or hydronephrosis. Bladder is unremarkable. Stomach/Bowel: The there is a small hiatal hernia. Appendix appears normal. No evidence of bowel wall thickening, distention, or inflammatory changes. Noninflamed diverticula are seen throughout the sigmoid colon. Lymphatic: No abnormal abdominal or pelvic lymph nodes are identified. Reproductive: A 1.2 cm x 0.8 cm calcified uterine fibroid is seen. The bilateral adnexa are unremarkable. Other: No abdominal wall hernia or abnormality. No abdominopelvic ascites. Musculoskeletal: Degenerative changes seen throughout the lumbar spine. Review of the MIP images confirms the above findings. IMPRESSION: 1. No evidence of pulmonary embolus. 2. No acute abnormalities within the chest, abdomen or pelvis. 3. Stable left adrenal mass which  may represent an adrenal adenoma. 4.  Small hiatal hernia. 5. Noninflamed sigmoid diverticulosis. Aortic Atherosclerosis (ICD10-I70.0). Electronically Signed   By: Virgina Norfolk M.D.   On: 03/25/2020 18:11    EKG: Normal sinus rhythm left ventricular hypertrophy  Weights: Filed Weights   03/25/20 1135  Weight: 46.3 kg     Physical Exam: Blood pressure (!) 133/93, pulse (!) 55, temperature 98.2 F (36.8 C), temperature source Oral, resp. rate 18, height 5' (1.524 m), weight 46.3 kg, SpO2 (!) 87 %. Body mass index is 19.92 kg/m. General: Well developed, well nourished, in no acute distress. Head eyes ears nose throat: Normocephalic, atraumatic, sclera non-icteric, no xanthomas, nares are without discharge. No apparent thyromegaly and/or mass  Lungs: Normal respiratory effort.  no wheezes, no rales, no rhonchi.  Heart: RRR with normal S1 S2. no murmur gallop, no rub, PMI is normal size and placement, carotid upstroke normal without bruit, jugular venous pressure is normal Abdomen: Soft, non-tender, non-distended with normoactive bowel sounds. No hepatomegaly. No rebound/guarding. No obvious abdominal masses. Abdominal aorta is normal size without bruit Extremities: No edema. no cyanosis, no clubbing, no ulcers  Peripheral : 2+ bilateral upper extremity pulses, 2+ bilateral femoral pulses, 2+ bilateral dorsal pedal pulse Neuro: Alert and oriented. No facial asymmetry. No focal deficit. Moves all extremities spontaneously. Musculoskeletal: Normal muscle tone without kyphosis Psych:  Responds to questions appropriately with a normal affect.    Assessment:  86 year old female with hypertension hyperlipidemia coronary artery disease with classic stable angina without evidence of myocardial infarction or EKG changes or congestive heart failure  Plan: 1.  Continue serial ECG and troponins to assess for possible myocardial infarction 2.  Consider addition of nitrates 30 mg each day for  further treatment of possible anginal symptoms 3.  Aspirin 81 mg 4.  High intensity cholesterol therapy 5.  Begin ambulation and follow for improvements of symptoms.  We have discussed at length the possibility of stress test versus cardiac catheterization for further evaluation and treatment options.  Patient understands the risk and benefits card catheterization.  This includes the possibility of death stroke heart attack infection bleeding or blood clot.  She is at low risk for conscious sedation  Signed, Corey Skains M.D. [facility] 03/26/2020, 1:42 PM

## 2020-03-26 NOTE — ED Notes (Signed)
Dr. Gigi Gin at bedside.

## 2020-03-26 NOTE — ED Notes (Signed)
Pt ambulatory to toilet with steady gait. Pt cleaned dentures on own after eating [place] sandwich.

## 2020-03-26 NOTE — Progress Notes (Signed)
PT Cancellation Note  Patient Details Name: [name] MRN: [id number] DOB: [birth year]   Cancelled Treatment:    Reason Eval/Treat Not Completed: PT screened, no needs identified, will sign off(Chart reviewed, RN consulted. RN reports pt has been AMB seevral times today with family or staff, 'laps around' without device, without acute impairment, no c/o symptoms related to ACS. Pt has no skilled PT needs at this time, will sign off.) Any further needs can be met as an OP either in OPPT or cardiac rehab PRN- will defer to cardiology.   3:51 PM, 03/26/20 Etta Grandchild, PT, DPT Physical Therapist - [facility]  [phone] ([place])   [place] 03/26/2020, 3:49 PM

## 2020-03-26 NOTE — ED Notes (Signed)
Family at bedside. Pt is up ad lib with daughter assistance.

## 2020-03-26 NOTE — ED Notes (Signed)
Pt ambulatory around nurses desk. Ambulated to bathroom with 1 assist.

## 2020-03-26 NOTE — ED Notes (Signed)
Even unlabored respirations noted. Pt sleeping at this time.

## 2020-03-27 LAB — BASIC METABOLIC PANEL
Anion gap: 8 (ref 5–15)
BUN: 21 mg/dL (ref 8–23)
CO2: 25 mmol/L (ref 22–32)
Calcium: 8.9 mg/dL (ref 8.9–10.3)
Chloride: 106 mmol/L (ref 98–111)
Creatinine, Ser: 0.59 mg/dL (ref 0.44–1.00)
GFR calc Af Amer: >60 mL/min (ref >60)
GFR calc non Af Amer: >60 mL/min (ref >60)
Glucose, Bld: 104 mg/dL — ABNORMAL HIGH (ref 70–99)
Potassium: 4 mmol/L (ref 3.5–5.1)
Sodium: 139 mmol/L (ref 135–145)

## 2020-03-27 LAB — CBC
HCT: 42.1 % (ref 36.0–46.0)
Hemoglobin: 14 g/dL (ref 12.0–15.0)
MCH: 29.6 pg (ref 26.0–34.0)
MCHC: 33.3 g/dL (ref 30.0–36.0)
MCV: 89 fL (ref 80.0–100.0)
Platelets: 189 10*3/uL (ref 150–400)
RBC: 4.73 MIL/uL (ref 3.87–5.11)
RDW: 13.4 % (ref 11.5–15.5)
WBC: 5.2 10*3/uL (ref 4.0–10.5)
nRBC: 0 % (ref 0.0–0.2)

## 2020-03-27 MED ORDER — ISOSORBIDE MONONITRATE ER 30 MG PO TB24: 15.0000 mg | ORAL_TABLET | Freq: Every day | ORAL | Status: DC

## 2020-03-27 MED ORDER — ISOSORBIDE MONONITRATE ER 30 MG PO TB24: 15.0000 mg | ORAL_TABLET | Freq: Every day | ORAL | 0 refills | Status: DC

## 2020-03-27 MED ORDER — ISOSORBIDE MONONITRATE ER 30 MG PO TB24
30.0000 mg | ORAL_TABLET | Freq: Every day | ORAL | Status: DC
  Administered 2020-03-27: 30 mg via ORAL
  Filled 2020-03-27: qty 1

## 2020-03-27 MED ORDER — ASPIRIN 81 MG PO TBEC: 81.0000 mg | DELAYED_RELEASE_TABLET | Freq: Every evening | ORAL | 0 refills | Status: DC

## 2020-03-27 MED ORDER — METOPROLOL SUCCINATE ER 25 MG PO TB24
12.5000 mg | ORAL_TABLET | Freq: Every day | ORAL | 0 refills | Status: DC
Start: 2020-03-27 — End: 2020-04-25

## 2020-03-27 NOTE — Progress Notes (Signed)
[facility] Encounter Note  Patient: [name] / Admit Date: 03/25/2020 / Date of Encounter: 03/27/2020, 9:07 AM   Subjective: Patient feels well today on appropriate meds for cad risk factors Troponin normal and ecg unchanged without evidence of mi ' No more chest pain  Review of Systems: Positive QQV:ZDGL Negative for: Vision change, hearing change, syncope, dizziness, nausea, vomiting,diarrhea, bloody stool, stomach pain, cough, congestion, diaphoresis, urinary frequency, urinary pain,skin lesions, skin rashes Others previously listed  Objective: Telemetry: nsr Physical Exam: Blood pressure (!) 161/80, pulse 63, temperature 98.2 F (36.8 C), temperature source Oral, resp. rate 18, height 5' (1.524 m), weight 45 kg, SpO2 98 %. Body mass index is 19.37 kg/m. General: Well developed, well nourished, in no acute distress. Head: Normocephalic, atraumatic, sclera non-icteric, no xanthomas, nares are without discharge. Neck: No apparent masses Lungs: Normal respirations with no wheezes, no rhonchi, no rales , no crackles   Heart: Regular rate and rhythm, normal S1 S2, no murmur, no rub, no gallop, PMI is normal size and placement, carotid upstroke normal without bruit, jugular venous pressure normal Abdomen: Soft, non-tender, non-distended with normoactive bowel sounds. No hepatosplenomegaly. Abdominal aorta is normal size without bruit Extremities: No edema, no clubbing, no cyanosis, no ulcers,  Peripheral: 2+ radial, 2+ femoral, 2+ dorsal pedal pulses Neuro: Alert and oriented. Moves all extremities spontaneously. Psych:  Responds to questions appropriately with a normal affect.   Intake/Output Summary (Last 24 hours) at 03/27/2020 0907 Last data filed at 03/27/2020 0401 Gross per 24 hour  Intake 120 ml  Output 700 ml  Net -580 ml    Inpatient Medications:  . aspirin EC  81 mg Oral QPM  . atorvastatin  10 mg Oral QPM  . enoxaparin (LOVENOX)  injection  40 mg Subcutaneous Q24H  . isosorbide mononitrate  30 mg Oral Daily  . metoprolol tartrate  12.5 mg Oral BID  . pantoprazole  40 mg Oral Daily  . sodium chloride flush  3 mL Intravenous Once   Infusions:   Labs: Recent Labs    03/26/20 0433 03/27/20 0458  NA 138 139  K 3.8 4.0  CL 104 106  CO2 26 25  GLUCOSE 98 104*  BUN 15 21  CREATININE 0.46 0.59  CALCIUM 8.9 8.9   Recent Labs    03/25/20 1138  AST 49*  ALT 24  ALKPHOS 52  BILITOT 0.7  PROT 7.5  ALBUMIN 4.4   Recent Labs    03/26/20 0433 03/27/20 0458  WBC 5.1 5.2  HGB 14.3 14.0  HCT 43.9 42.1  MCV 89.6 89.0  PLT 194 189   No results for input(s): CKTOTAL, CKMB, TROPONINI in the last 72 hours. Invalid input(s): POCBNP No results for input(s): HGBA1C in the last 72 hours.   Weights: Filed Weights   03/25/20 1135 03/26/20 1626 03/27/20 0353  Weight: 46.3 kg 45 kg 45 kg     Radiology/Studies:  DG Chest 2 View  Result Date: 03/25/2020 CLINICAL DATA:  Right-sided chest pain EXAM: CHEST - 2 VIEW COMPARISON:  03/12/2020 FINDINGS: Cardiac shadows within normal limits. Aortic calcifications are again seen. Lungs are hyperinflated bilaterally consistent with COPD. Chronic compression deformity in the lower thoracic spine is noted. Multilevel degenerative changes are seen. IMPRESSION: COPD without acute abnormality. Electronically Signed   By: Inez Catalina M.D.   On: 03/25/2020 12:09   DG Chest 2 View  Result Date: 03/13/2020 CLINICAL DATA:  Shortness of breath.  Chest pain. EXAM: CHEST -  2 VIEW COMPARISON:  01/17/2019. FINDINGS: Mediastinum and hilar structures normal. Lungs are clear. No pleural effusion or pneumothorax. Heart size normal. Old right rib fractures again noted. Stable mild lower thoracic compression fracture. Degenerative changes both shoulders. Loose bodies about the left shoulder may be present. IMPRESSION: No acute cardiopulmonary disease. Electronically Signed   By: Marcello Moores  Register    On: 03/13/2020 07:41   ECHOCARDIOGRAM COMPLETE  Result Date: 03/26/2020    ECHOCARDIOGRAM REPORT   Patient Name:   [name] Date of Exam: 03/25/2020 Medical Rec #:  [id number]           Height:       60.0 in Accession #:    [id number]          Weight:       102.0 lb Date of Birth:  10-Mar-1927          BSA:          1.402 m Patient Age:    86 years            BP:           112/68 mmHg Patient Gender: F                   HR:           67 bpm. Exam Location:  [facility] Procedure: 2D Echo, Cardiac Doppler and Color Doppler Indications:     R07.9 Chest pain  History:         Patient has no prior history of Echocardiogram examinations.                  Risk Factors:Dyslipidemia. Murmur. Chronic kidney disease.  Sonographer:     Wilford Sports Rodgers-Jones Referring Phys:  [id number] [place] Diagnosing Phys: Serafina Royals MD IMPRESSIONS  1. Left ventricular ejection fraction, by estimation, is 60 to 65%. The left ventricle has normal function. The left ventricle has no regional wall motion abnormalities. Left ventricular diastolic parameters were normal.  2. Right ventricular systolic function is normal. The right ventricular size is normal.  3. The mitral valve is normal in structure. Mild mitral valve regurgitation.  4. The aortic valve is normal in structure. Aortic valve regurgitation is trivial. FINDINGS  Left Ventricle: Left ventricular ejection fraction, by estimation, is 60 to 65%. The left ventricle has normal function. The left ventricle has no regional wall motion abnormalities. The left ventricular internal cavity size was normal in size. There is  no left ventricular hypertrophy. Left ventricular diastolic parameters were normal. Right Ventricle: The right ventricular size is normal. No increase in right ventricular wall thickness. Right ventricular systolic function is normal. Left Atrium: Left atrial size was normal in size. Right Atrium: Right atrial size was normal in size. Pericardium: There is  no evidence of pericardial effusion. Mitral Valve: The mitral valve is normal in structure. Mild mitral valve regurgitation. Tricuspid Valve: The tricuspid valve is normal in structure. Tricuspid valve regurgitation is trivial. Aortic Valve: The aortic valve is normal in structure. Aortic valve regurgitation is trivial. Pulmonic Valve: The pulmonic valve was normal in structure. Pulmonic valve regurgitation is not visualized. Aorta: The aortic root and ascending aorta are structurally normal, with no evidence of dilitation. IAS/Shunts: No atrial level shunt detected by color flow Doppler.  LEFT VENTRICLE PLAX 2D LVIDd:         3.96 cm  Diastology LVIDs:         2.54 cm  LV e'  lateral:   6.85 cm/s LV PW:         1.07 cm  LV E/e' lateral: 10.4 LV IVS:        1.06 cm  LV e' medial:    4.24 cm/s LVOT diam:     2.10 cm  LV E/e' medial:  16.8 LV SV:         79 LV SV Index:   56 LVOT Area:     3.46 cm  RIGHT VENTRICLE RV Basal diam:  3.25 cm RV S prime:     16.00 cm/s TAPSE (M-mode): 2.4 cm LEFT ATRIUM             Index       RIGHT ATRIUM           Index LA diam:        3.50 cm 2.50 cm/m  RA Area:     13.30 cm LA Vol (A2C):   45.2 ml 32.24 ml/m RA Volume:   35.50 ml  25.32 ml/m LA Vol (A4C):   28.0 ml 19.97 ml/m LA Biplane Vol: 35.9 ml 25.60 ml/m  AORTIC VALVE LVOT Vmax:   113.00 cm/s LVOT Vmean:  79.100 cm/s LVOT VTI:    0.228 m  AORTA Ao Root diam: 3.10 cm MITRAL VALVE MV Area (PHT): 2.22 cm     SHUNTS MV Decel Time: 342 msec     Systemic VTI:  0.23 m MV E velocity: 71.10 cm/s   Systemic Diam: 2.10 cm MV A velocity: 108.00 cm/s MV E/A ratio:  0.66 Serafina Royals MD Electronically signed by Serafina Royals MD Signature Date/Time: 03/26/2020/5:20:33 PM    Final    CT Angio Chest/Abd/Pel for Dissection W and/or Wo Contrast  Result Date: 03/25/2020 CLINICAL DATA:  Intermittent chest pain. EXAM: CT ANGIOGRAPHY CHEST, ABDOMEN AND PELVIS TECHNIQUE: Non-contrast CT of the chest was initially obtained. Multidetector CT  imaging through the chest, abdomen and pelvis was performed using the standard protocol during bolus administration of intravenous contrast. Multiplanar reconstructed images and MIPs were obtained and reviewed to evaluate the vascular anatomy. CONTRAST:  56mL OMNIPAQUE IOHEXOL 350 MG/ML SOLN COMPARISON:  July 24, 2019 FINDINGS: CTA CHEST FINDINGS Cardiovascular: Satisfactory opacification of the pulmonary arteries to the segmental level. No evidence of pulmonary embolism. Normal heart size. No pericardial effusion. Mediastinum/Nodes: No enlarged mediastinal, hilar, or axillary lymph nodes. Thyroid gland, trachea, and esophagus demonstrate no significant findings. Lungs/Pleura: Lungs are clear. No pleural effusion or pneumothorax. Musculoskeletal: Third, fourth and fifth chronic lateral right rib fractures are seen. Multilevel degenerative changes seen throughout the thoracic spine. Review of the MIP images confirms the above findings. CTA ABDOMEN AND PELVIS FINDINGS VASCULAR Aorta: Moderate severity calcification of a normal caliber aorta without aneurysm, dissection, vasculitis or significant stenosis. Celiac: Patent without evidence of aneurysm, dissection, vasculitis or significant stenosis. SMA: Patent SMA stent without evidence of aneurysm, dissection, vasculitis or significant stenosis. Renals: Moderate-severity calcification. Both renal arteries are patent without evidence of aneurysm, dissection, vasculitis, fibromuscular dysplasia or hemodynamically significant stenosis. IMA: Patent without evidence of aneurysm, dissection, vasculitis or significant stenosis. Inflow: Moderate to marked severity calcification without evidence of aneurysm, dissection, vasculitis or hemodynamically significant stenosis. Veins: No obvious venous abnormality within the limitations of this arterial phase study. Review of the MIP images confirms the above findings. NON-VASCULAR Hepatobiliary: No focal liver abnormality is  seen. Status post cholecystectomy. No biliary dilatation. Pancreas: Unremarkable. No pancreatic ductal dilatation or surrounding inflammatory changes. Spleen: Normal in size without focal  abnormality. Adrenals/Urinary Tract: The right adrenal gland is normal in appearance. A stable 1.3 cm x 0.9 cm isodense left adrenal mass is noted. Kidneys are normal, without renal calculi, focal lesion, or hydronephrosis. Bladder is unremarkable. Stomach/Bowel: The there is a small hiatal hernia. Appendix appears normal. No evidence of bowel wall thickening, distention, or inflammatory changes. Noninflamed diverticula are seen throughout the sigmoid colon. Lymphatic: No abnormal abdominal or pelvic lymph nodes are identified. Reproductive: A 1.2 cm x 0.8 cm calcified uterine fibroid is seen. The bilateral adnexa are unremarkable. Other: No abdominal wall hernia or abnormality. No abdominopelvic ascites. Musculoskeletal: Degenerative changes seen throughout the lumbar spine. Review of the MIP images confirms the above findings. IMPRESSION: 1. No evidence of pulmonary embolus. 2. No acute abnormalities within the chest, abdomen or pelvis. 3. Stable left adrenal mass which may represent an adrenal adenoma. 4. Small hiatal hernia. 5. Noninflamed sigmoid diverticulosis. Aortic Atherosclerosis (ICD10-I70.0). Electronically Signed   By: Virgina Norfolk M.D.   On: 03/25/2020 18:11     Assessment and Recommendation  86 y.o. female with cad htn and lipidemia with angina and no MI 1. Continue b-blocker and mindur for cp 2. Lipid mgt 3. Ambulate and follow for sx 4. [place] for dc to home with follow up in office next week for further tx options  Signed, Serafina Royals M.D. FACC

## 2020-03-27 NOTE — Plan of Care (Signed)
  Problem: Education: Goal: Knowledge of General Education information will improve Description Including pain rating scale, medication(s)/side effects and non-pharmacologic comfort measures Outcome: Progressing   

## 2020-03-28 ENCOUNTER — Telehealth: Payer: Self-pay

## 2020-03-28 NOTE — Telephone Encounter (Signed)
Transition Care Management Follow-up Telephone Call  Date of discharge and from where: 03/27/20 from [facility]  How have you been since you were released from the hospital? Denies chest pain, nausea, emesis, diarrhea, headache, dizziness. Patient states, "I am doing so much better, good today, but the isosorbide made me feel a little weak and tired so I plan to take it at bedtime. Now taking Imdur 15mg  daily and will call Cardiology if worsening symptoms while taking this medication." Appetite is good. BM/voiding appropriate. Monitoring BP daily, today 140/88, P89.  Any questions or concerns? I am still having the episodic gas pains discussed with Dr. Derrel Nip on 5/5 that is not associated with chest pain. I would like to discuss it again at our upcoming visit and figure out why it is still happening.   Items Reviewed:  Did the pt receive and understand the discharge instructions provided? Increase activity slowly.   Medications obtained and verified? Yes. Taking all medications as directed. Added metoprolol 12.5 mg daily, imdur 15mg .   Any new allergies since your discharge? No  Dietary orders reviewed? Low sodium, heart healthy  Do you have support at home? Yes, daughter  Functional Questionnaire: (I = Independent and D = Dependent) ADLs: I  Bathing/Dressing- I  Meal Prep- I  Eating- I  Maintaining continence- I  Transferring/Ambulation- I  Managing Meds- Daughter manages  Follow up appointments reviewed:   PCP Hospital f/u appt confirmed? Scheduled to see Dr. Derrel Nip on 04/03/20 @ 9:00.  [facility] f/u appt confirmed? Scheduled to see Cardiology on 04/08/20 @ 2:15.  Are transportation arrangements needed? No  If their condition worsens, is the pt aware to call PCP or go to the Emergency Dept.? Yes  Was the patient provided with contact information for the PCP's office or ED? Yes  Was to pt encouraged to call back with questions or concerns? Yes

## 2020-04-01 ENCOUNTER — Other Ambulatory Visit: Payer: Self-pay

## 2020-04-03 ENCOUNTER — Ambulatory Visit (INDEPENDENT_AMBULATORY_CARE_PROVIDER_SITE_OTHER): Payer: Medicare Other | Admitting: Internal Medicine

## 2020-04-03 ENCOUNTER — Other Ambulatory Visit: Payer: Self-pay

## 2020-04-03 ENCOUNTER — Encounter: Payer: Self-pay | Admitting: Internal Medicine

## 2020-04-03 VITALS — BP 112/66 | HR 60 | Temp 98.0°F | Resp 15 | Ht 60.0 in | Wt 101.6 lb

## 2020-04-03 DIAGNOSIS — I208 Other forms of angina pectoris: Secondary | ICD-10-CM | POA: Diagnosis not present

## 2020-04-03 DIAGNOSIS — R079 Chest pain, unspecified: Secondary | ICD-10-CM

## 2020-04-03 DIAGNOSIS — I700 Atherosclerosis of aorta: Secondary | ICD-10-CM

## 2020-04-03 DIAGNOSIS — I10 Essential (primary) hypertension: Secondary | ICD-10-CM | POA: Diagnosis not present

## 2020-04-03 DIAGNOSIS — R1031 Right lower quadrant pain: Secondary | ICD-10-CM

## 2020-04-03 DIAGNOSIS — Z09 Encounter for follow-up examination after completed treatment for conditions other than malignant neoplasm: Secondary | ICD-10-CM

## 2020-04-03 LAB — LIPID PANEL
Cholesterol: 137 mg/dL (ref 0–200)
HDL: 62.8 mg/dL (ref >39.00)
LDL Cholesterol: 55 mg/dL (ref 0–99)
NonHDL: 74.25
Total CHOL/HDL Ratio: 2
Triglycerides: 95 mg/dL (ref 0.0–149.0)
VLDL: 19 mg/dL (ref 0.0–40.0)

## 2020-04-03 LAB — TROPONIN I (HIGH SENSITIVITY): High Sens Troponin I: 12 ng/L (ref 2–17)

## 2020-04-03 NOTE — Patient Instructions (Addendum)
Your BP is very very good on your current regimen   If it drops below 110/60,  Suspend the imdur until your readings are > 130/80   Use the nitroglycerin sublingual tablet for an episode of chest pain .  Repeat in 5 minutes if chest pain is not gone,  And again in 5 more minutes if still present . DO NOT TAKE MORE THAN 3 TOTAL .  You  Must call 911 if pain not gone  Take the paperwork with you to [facility] and if you go to the ER with chest pain,  Show the papers to the ER physician  I will make the referral to Dr Fletcher Anon and his colleagues for a second opinion because it will take a few weeks to get in.    I want you to take 1000 mg of tylenol two times daily (every 12 hours) as a test for one week to see if your side pain  Improves

## 2020-04-03 NOTE — Progress Notes (Signed)
Subjective:  Patient ID: [name], female    DOB: 1927-06-27  Age: 86 y.o. MRN: [id number]  CC: The primary encounter diagnosis was Atherosclerosis of abdominal aorta ([place]). Diagnoses of Angina of effort [facility]), Right lower quadrant abdominal pain, Accelerated hypertension, Chest pain, unspecified type, and Hospital discharge follow-up were also pertinent to this visit.  HPI [name] presents for [name]  This visit occurred during the SARS-CoV-2 public health emergency.  Safety protocols were in place, including screening questions prior to the visit, additional usage of staff PPE, and extensive cleaning of exam room while observing appropriate contact time as indicated for disinfecting solutions.   Admitted on May 18 with history of recurrent SSCP described as squeezing . Most recent episode occurred while coming up the basement stairs and was accompanied by feeling dizzy and presyncopal.    Admitting diagnoses was 1) angina  2) hypertensive urgency with BP 207/96  CT angio noted significant atherosclerosis without stenosis High sensitivity Troponin I  was slightly elevated  At 19 , no ekg changes ECHO normal  She was prescribed Imdur 30 and has  metoprolol  XL 25 mg and has been taking 1/2 tablet of each daily.  She has had no chest pain since discharge on May 20 .  She has Cardiology follow up June  1 with Nehemiah Massed and an outpatient stress test is planner.   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  Reviewed physiology and recent imaging studies. Outpatient Medications Prior to Visit  Medication Sig Dispense Refill  . acetaminophen (TYLENOL) 500 MG tablet Take 500 mg by mouth every 6 (six) hours as needed for mild pain.    . Alfalfa 500 MG TABS Take 500 mg by mouth.    . Ascorbic Acid (VITAMIN C CR) 500 MG CPCR Take 500 mg by mouth daily.    [name] aspirin EC 81 MG EC tablet Take 1 tablet (81 mg total) by mouth  every evening. 120 tablet 0  . atorvastatin (LIPITOR) 10 MG tablet Take 1 tablet (10 mg total) by mouth every evening. 30 tablet 6  . Calcium Carbonate-Vitamin D 600-200 MG-UNIT CAPS Take by mouth.    . Cholecalciferol (VITAMIN D3) 3000 UNITS TABS Take 3,000 Units by mouth 2 (two) times daily.     [name] CINNAMON PO Take 1,000 mg by mouth daily.     . clotrimazole-betamethasone (LOTRISONE) cream Apply topically 2 (two) times daily. 30 g 1  . co-enzyme Q-10 30 MG capsule Take 30 mg by mouth daily.     . diclofenac sodium (VOLTAREN) 1 % GEL     . fluticasone (FLONASE) 50 MCG/ACT nasal spray Place 1 spray into both nostrils daily.    . Ginkgo Biloba (GNP GINGKO BILOBA EXTRACT PO) Take 1 tablet by mouth daily.    . Glucosamine Sulfate 500 MG CAPS Take 500 mg by mouth daily.    . isosorbide mononitrate (IMDUR) 30 MG 24 hr tablet Take 0.5 tablets (15 mg total) by mouth daily. 15 tablet 0  . Lactobacillus (PROBIOTIC ACIDOPHILUS PO) Take 1 capsule by mouth daily.     [name] loratadine (CLARITIN) 10 MG tablet Take 10 mg by mouth daily as needed for allergies.    . Magnesium 400 MG CAPS Take 400 mg by mouth daily.    . metoprolol succinate (TOPROL XL) 25 MG 24 hr tablet Take 0.5 tablets (12.5 mg total) by mouth daily. 15 tablet 0  .  Multiple Vitamin (MULTIVITAMIN) capsule Take 1 capsule by mouth daily.    . Omega-3 Fatty Acids (OMEGA 3 PO) Take 520 mg by mouth daily.     . pantoprazole (PROTONIX) 40 MG tablet Take 1 tablet (40 mg total) by mouth daily. 90 tablet 1  . Pramoxine-HC (HYDROCORTISONE ACE-PRAMOXINE) 2.5-1 % CREA Apply topically as needed.     . Turmeric 450 MG CAPS Take 450 Units by mouth.    . vitamin B-12 (CYANOCOBALAMIN) 1000 MCG tablet Take 1,000 mcg by mouth daily.     No facility-administered medications prior to visit.    Review of Systems;  Patient denies headache, fevers, malaise, unintentional weight loss, skin rash, eye pain, sinus congestion and sinus pain, sore throat, dysphagia,   hemoptysis , cough, dyspnea, wheezing, chest pain, palpitations, orthopnea, edema, abdominal pain, nausea, melena, diarrhea, constipation, flank pain, dysuria, hematuria, urinary  Frequency, nocturia, numbness, tingling, seizures,  Focal weakness, Loss of consciousness,  Tremor, insomnia, depression, anxiety, and suicidal ideation.      Objective:  BP 112/66 (BP Location: Left Arm, Patient Position: Sitting, Cuff Size: Normal)   Pulse 60   Temp 98 F (36.7 C) (Temporal)   Resp 15   Ht 5' (1.524 m)   Wt 101 lb 9.6 oz (46.1 kg)   SpO2 95%   BMI 19.84 kg/m   BP Readings from Last 3 Encounters:  04/03/20 112/66  03/27/20 123/61  03/12/20 140/80    Wt Readings from Last 3 Encounters:  04/03/20 101 lb 9.6 oz (46.1 kg)  03/27/20 99 lb 3.2 oz (45 kg)  03/12/20 102 lb 3.2 oz (46.4 kg)    General appearance: alert, cooperative and appears stated age Ears: normal TM's and external ear canals both ears Throat: lips, mucosa, and tongue normal; teeth and gums normal Neck: no adenopathy, no carotid bruit, supple, symmetrical, trachea midline and thyroid not enlarged, symmetric, no tenderness/mass/nodules Back: symmetric, no curvature. ROM normal. No CVA tenderness. Lungs: clear to auscultation bilaterally Heart: regular rate and rhythm, S1, S2 normal, no murmur, click, rub or gallop Abdomen: soft, non-tender; bowel sounds normal; no masses,  no organomegaly Pulses: 2+ and symmetric Skin: Skin color, texture, turgor normal. No rashes or lesions Lymph nodes: Cervical, supraclavicular, and axillary nodes normal.  Lab Results  Component Value Date   HGBA1C 6.1 (H) 05/14/2019   HGBA1C 6.2 (H) 01/01/2019   HGBA1C 6.1 (H) 03/14/2018    Lab Results  Component Value Date   CREATININE 0.59 03/27/2020   CREATININE 0.46 03/26/2020   CREATININE 0.57 03/25/2020    Lab Results  Component Value Date   WBC 5.2 03/27/2020   HGB 14.0 03/27/2020   HCT 42.1 03/27/2020   PLT 189 03/27/2020    GLUCOSE 104 (H) 03/27/2020   CHOL 137 04/03/2020   TRIG 95.0 04/03/2020   HDL 62.80 04/03/2020   LDLCALC 55 04/03/2020   ALT 24 03/25/2020   AST 49 (H) 03/25/2020   NA 139 03/27/2020   K 4.0 03/27/2020   CL 106 03/27/2020   CREATININE 0.59 03/27/2020   BUN 21 03/27/2020   CO2 25 03/27/2020   TSH 2.22 03/12/2020   HGBA1C 6.1 (H) 05/14/2019    DG Chest 2 View  Result Date: 03/25/2020 CLINICAL DATA:  Right-sided chest pain EXAM: CHEST - 2 VIEW COMPARISON:  03/12/2020 FINDINGS: Cardiac shadows within normal limits. Aortic calcifications are again seen. Lungs are hyperinflated bilaterally consistent with COPD. Chronic compression deformity in the lower thoracic spine is noted. Multilevel degenerative changes  are seen. IMPRESSION: COPD without acute abnormality. Electronically Signed   By: Inez Catalina M.D.   On: 03/25/2020 12:09   ECHOCARDIOGRAM COMPLETE  Result Date: 03/26/2020    ECHOCARDIOGRAM REPORT   Patient Name:   Melony ELLORA VARNUM Date of Exam: 03/25/2020 Medical Rec #:  [id number]           Height:       60.0 in Accession #:    [id number]          Weight:       102.0 lb Date of Birth:  05/31/1927          BSA:          1.402 m Patient Age:    46 years            BP:           112/68 mmHg Patient Gender: F                   HR:           67 bpm. Exam Location:  [facility] Procedure: 2D Echo, Cardiac Doppler and Color Doppler Indications:     R07.9 Chest pain  History:         Patient has no prior history of Echocardiogram examinations.                  Risk Factors:Dyslipidemia. Murmur. Chronic kidney disease.  Sonographer:     Wilford Sports Rodgers-Jones Referring Phys:  [id number] [place] Diagnosing Phys: Serafina Royals MD IMPRESSIONS  1. Left ventricular ejection fraction, by estimation, is 60 to 65%. The left ventricle has normal function. The left ventricle has no regional wall motion abnormalities. Left ventricular diastolic parameters were normal.  2. Right ventricular systolic function is  normal. The right ventricular size is normal.  3. The mitral valve is normal in structure. Mild mitral valve regurgitation.  4. The aortic valve is normal in structure. Aortic valve regurgitation is trivial. FINDINGS  Left Ventricle: Left ventricular ejection fraction, by estimation, is 60 to 65%. The left ventricle has normal function. The left ventricle has no regional wall motion abnormalities. The left ventricular internal cavity size was normal in size. There is  no left ventricular hypertrophy. Left ventricular diastolic parameters were normal. Right Ventricle: The right ventricular size is normal. No increase in right ventricular wall thickness. Right ventricular systolic function is normal. Left Atrium: Left atrial size was normal in size. Right Atrium: Right atrial size was normal in size. Pericardium: There is no evidence of pericardial effusion. Mitral Valve: The mitral valve is normal in structure. Mild mitral valve regurgitation. Tricuspid Valve: The tricuspid valve is normal in structure. Tricuspid valve regurgitation is trivial. Aortic Valve: The aortic valve is normal in structure. Aortic valve regurgitation is trivial. Pulmonic Valve: The pulmonic valve was normal in structure. Pulmonic valve regurgitation is not visualized. Aorta: The aortic root and ascending aorta are structurally normal, with no evidence of dilitation. IAS/Shunts: No atrial level shunt detected by color flow Doppler.  LEFT VENTRICLE PLAX 2D LVIDd:         3.96 cm  Diastology LVIDs:         2.54 cm  LV e' lateral:   6.85 cm/s LV PW:         1.07 cm  LV E/e' lateral: 10.4 LV IVS:        1.06 cm  LV e' medial:    4.24 cm/s LVOT diam:  2.10 cm  LV E/e' medial:  16.8 LV SV:         79 LV SV Index:   56 LVOT Area:     3.46 cm  RIGHT VENTRICLE RV Basal diam:  3.25 cm RV S prime:     16.00 cm/s TAPSE (M-mode): 2.4 cm LEFT ATRIUM             Index       RIGHT ATRIUM           Index LA diam:        3.50 cm 2.50 cm/m  RA Area:      13.30 cm LA Vol (A2C):   45.2 ml 32.24 ml/m RA Volume:   35.50 ml  25.32 ml/m LA Vol (A4C):   28.0 ml 19.97 ml/m LA Biplane Vol: 35.9 ml 25.60 ml/m  AORTIC VALVE LVOT Vmax:   113.00 cm/s LVOT Vmean:  79.100 cm/s LVOT VTI:    0.228 m  AORTA Ao Root diam: 3.10 cm MITRAL VALVE MV Area (PHT): 2.22 cm     SHUNTS MV Decel Time: 342 msec     Systemic VTI:  0.23 m MV E velocity: 71.10 cm/s   Systemic Diam: 2.10 cm MV A velocity: 108.00 cm/s MV E/A ratio:  0.66 Serafina Royals MD Electronically signed by Serafina Royals MD Signature Date/Time: 03/26/2020/5:20:33 PM    Final    CT Angio Chest/Abd/Pel for Dissection W and/or Wo Contrast  Result Date: 03/25/2020 CLINICAL DATA:  Intermittent chest pain. EXAM: CT ANGIOGRAPHY CHEST, ABDOMEN AND PELVIS TECHNIQUE: Non-contrast CT of the chest was initially obtained. Multidetector CT imaging through the chest, abdomen and pelvis was performed using the standard protocol during bolus administration of intravenous contrast. Multiplanar reconstructed images and MIPs were obtained and reviewed to evaluate the vascular anatomy. CONTRAST:  67mL OMNIPAQUE IOHEXOL 350 MG/ML SOLN COMPARISON:  July 24, 2019 FINDINGS: CTA CHEST FINDINGS Cardiovascular: Satisfactory opacification of the pulmonary arteries to the segmental level. No evidence of pulmonary embolism. Normal heart size. No pericardial effusion. Mediastinum/Nodes: No enlarged mediastinal, hilar, or axillary lymph nodes. Thyroid gland, trachea, and esophagus demonstrate no significant findings. Lungs/Pleura: Lungs are clear. No pleural effusion or pneumothorax. Musculoskeletal: Third, fourth and fifth chronic lateral right rib fractures are seen. Multilevel degenerative changes seen throughout the thoracic spine. Review of the MIP images confirms the above findings. CTA ABDOMEN AND PELVIS FINDINGS VASCULAR Aorta: Moderate severity calcification of a normal caliber aorta without aneurysm, dissection, vasculitis or  significant stenosis. Celiac: Patent without evidence of aneurysm, dissection, vasculitis or significant stenosis. SMA: Patent SMA stent without evidence of aneurysm, dissection, vasculitis or significant stenosis. Renals: Moderate-severity calcification. Both renal arteries are patent without evidence of aneurysm, dissection, vasculitis, fibromuscular dysplasia or hemodynamically significant stenosis. IMA: Patent without evidence of aneurysm, dissection, vasculitis or significant stenosis. Inflow: Moderate to marked severity calcification without evidence of aneurysm, dissection, vasculitis or hemodynamically significant stenosis. Veins: No obvious venous abnormality within the limitations of this arterial phase study. Review of the MIP images confirms the above findings. NON-VASCULAR Hepatobiliary: No focal liver abnormality is seen. Status post cholecystectomy. No biliary dilatation. Pancreas: Unremarkable. No pancreatic ductal dilatation or surrounding inflammatory changes. Spleen: Normal in size without focal abnormality. Adrenals/Urinary Tract: The right adrenal gland is normal in appearance. A stable 1.3 cm x 0.9 cm isodense left adrenal mass is noted. Kidneys are normal, without renal calculi, focal lesion, or hydronephrosis. Bladder is unremarkable. Stomach/Bowel: The there is a small hiatal hernia. Appendix appears normal.  No evidence of bowel wall thickening, distention, or inflammatory changes. Noninflamed diverticula are seen throughout the sigmoid colon. Lymphatic: No abnormal abdominal or pelvic lymph nodes are identified. Reproductive: A 1.2 cm x 0.8 cm calcified uterine fibroid is seen. The bilateral adnexa are unremarkable. Other: No abdominal wall hernia or abnormality. No abdominopelvic ascites. Musculoskeletal: Degenerative changes seen throughout the lumbar spine. Review of the MIP images confirms the above findings. IMPRESSION: 1. No evidence of pulmonary embolus. 2. No acute abnormalities  within the chest, abdomen or pelvis. 3. Stable left adrenal mass which may represent an adrenal adenoma. 4. Small hiatal hernia. 5. Noninflamed sigmoid diverticulosis. Aortic Atherosclerosis (ICD10-I70.0). Electronically Signed   By: Virgina Norfolk M.D.   On: 03/25/2020 18:11    Assessment & Plan:   Problem List Items Addressed This Visit      Unprioritized   Abdominal pain    Right sided ,  Post prandial ,  With bloating.  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  Recommend use of Beano and Gas X       Accelerated hypertension    Resolved, BP now managed with minimal medications/       Angina of effort (HCC)   Relevant Orders   Troponin I (High Sensitivity) (Completed)   Ambulatory referral to Cardiology   Atherosclerosis of abdominal aorta ([place]) - Primary    Continue statin and annual follow up with vascular surgery       Relevant Orders   Lipid panel (Completed)   Chest pain    Given her PAD,  The probability of CAD is high .  She has normal renal function  And requests an aggressive approach.  Will refer to DR Fletcher Anon for second opinion       Hospital discharge follow-up    Patient is stable post discharge and has no new issues or questions about discharge plans at the visit today for hospital follow up. All labs , imaging studies and progress notes from admission were reviewed with patient today           I am having Ellie Lunch maintain her Vitamin D3, multivitamin, Omega-3 Fatty Acids (OMEGA 3 PO), Vitamin C CR, Lactobacillus (PROBIOTIC ACIDOPHILUS PO), Glucosamine Sulfate, vitamin B-12, CINNAMON PO, Turmeric, co-enzyme Q-10, acetaminophen, Magnesium, loratadine, Ginkgo Biloba (GNP GINGKO BILOBA EXTRACT PO), Calcium Carbonate-Vitamin D, Hydrocortisone Ace-Pramoxine, fluticasone, clotrimazole-betamethasone, diclofenac sodium, pantoprazole, atorvastatin, Alfalfa,  aspirin, isosorbide mononitrate, and metoprolol succinate.  No orders of the defined types were placed in this encounter.   There are no discontinued medications.  Follow-up: No follow-ups on file.   Crecencio Mc, MD

## 2020-04-05 DIAGNOSIS — Z09 Encounter for follow-up examination after completed treatment for conditions other than malignant neoplasm: Secondary | ICD-10-CM | POA: Insufficient documentation

## 2020-04-05 NOTE — Assessment & Plan Note (Signed)
Right sided ,  Post prandial ,  With bloating.  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  Recommend use of Beano and Gas X

## 2020-04-05 NOTE — Assessment & Plan Note (Signed)
Resolved, BP now managed with minimal medications/

## 2020-04-05 NOTE — Assessment & Plan Note (Signed)
Given her PAD,  The probability of CAD is high .  She has normal renal function  And requests an aggressive approach.  Will refer to DR Fletcher Anon for second opinion

## 2020-04-05 NOTE — Assessment & Plan Note (Addendum)
Continue statin and annual follow up with vascular surgery

## 2020-04-05 NOTE — Assessment & Plan Note (Signed)
Patient is stable post discharge and has no new issues or questions about discharge plans at the visit today for hospital follow up. All labs , imaging studies and progress notes from admission were reviewed with patient today   

## 2020-04-08 ENCOUNTER — Telehealth: Payer: Self-pay | Admitting: Internal Medicine

## 2020-04-08 NOTE — Telephone Encounter (Signed)
Pt was returning called

## 2020-04-09 NOTE — Telephone Encounter (Signed)
[name] spoke with pt yesterday in regards to her lab results.

## 2020-04-17 ENCOUNTER — Telehealth (INDEPENDENT_AMBULATORY_CARE_PROVIDER_SITE_OTHER): Payer: Self-pay | Admitting: Vascular Surgery

## 2020-04-17 NOTE — Telephone Encounter (Signed)
Patient was made aware with medical advice and verbalized understanding 

## 2020-04-17 NOTE — Telephone Encounter (Signed)
She can be seen sooner with ABIs as well as bil venous reflux.  If she is swelling she should use compression and elevate until her appt.  Me or dew

## 2020-04-17 NOTE — Telephone Encounter (Signed)
Has appt coming up in August. Wants to be seen sooner as she is experiencing BLE swelling R>L., tingling and toes stiff. She would like to be seen sooner. Please advise

## 2020-04-18 ENCOUNTER — Encounter: Payer: Self-pay | Admitting: Cardiology

## 2020-04-18 ENCOUNTER — Other Ambulatory Visit: Payer: Self-pay

## 2020-04-18 ENCOUNTER — Ambulatory Visit (INDEPENDENT_AMBULATORY_CARE_PROVIDER_SITE_OTHER): Payer: Medicare Other | Admitting: Cardiology

## 2020-04-18 VITALS — BP 146/90 | HR 64 | Ht 60.0 in | Wt 103.5 lb

## 2020-04-18 DIAGNOSIS — R079 Chest pain, unspecified: Secondary | ICD-10-CM | POA: Diagnosis not present

## 2020-04-18 DIAGNOSIS — I10 Essential (primary) hypertension: Secondary | ICD-10-CM | POA: Diagnosis not present

## 2020-04-18 DIAGNOSIS — E7800 Pure hypercholesterolemia, unspecified: Secondary | ICD-10-CM | POA: Diagnosis not present

## 2020-04-18 NOTE — Progress Notes (Signed)
Cardiology Office Note:    Date:  04/18/2020   ID:  [name], DOB [birth year], MRN [id number]  PCP:  Crecencio Mc, MD  [place] Cardiologist:  No primary care provider on file.  [facility] Electrophysiologist:  None   Referring MD: Crecencio Mc, MD   Chief Complaint  Patient presents with  . New Patient (Initial Visit)    Chest pain and DOE; Meds verbally reviewed with patient.    History of Present Illness:    [name] is a 86 y.o. female with a hx of hypertension, CKD, presenting with chest pain and shortness of breath.  Patient states having an episode of chest pain about 2 to 3 weeks ago which felt like a baseball hit the center of her chest.  She presented to the emergency room where EKG and troponin were unremarkable.  She was seen by Dr. Nehemiah Massed and sees him as outpatient.  She presents for second opinion.  Echocardiogram on 03/25/2020 showed normal systolic and diastolic function, EF 60 to 65%.  Imdur was recommended on discharge, as listed on patient's medication list, but patient states she has not taken.  She denies having any similar episodes since.  Currently feels fine.  Denies chest pain with exertion, but endorses some dyspnea.  Stress test is being planned by [facility] clinic.  Patient had questions or concerns about left heart cath.   Past Medical History:  Diagnosis Date  . Allergy   . Arthritis   . Atherosclerosis   . Atherosclerosis of abdominal aorta ([place])   . Chronic kidney disease    stage 2  . CKD (chronic kidney disease) stage 2, GFR 60-89 ml/min   . Collagen vascular disease ([place])   . Diverticulosis 2008  . GERD (gastroesophageal reflux disease)   . Heart murmur   . Hyperlipidemia   . Lumbar spinal stenosis   . Osteoporosis   . Ovarian lump   . RA (rheumatoid arthritis) ([place])   . Raynaud's disease   . Rheumatoid arthritis ([place])   . Spinal stenosis     Past Surgical History:  Procedure Laterality Date  .  BREAST SURGERY Left 1965   Benign biopsy  . CHOLECYSTECTOMY  2005   [place]  . COLONOSCOPY  2008   Dr. Donnella Sham  . EXCISION OF BREAST BIOPSY Left 1965   benign  . EYE SURGERY Bilateral    Cataract Extraction with IOL  . KYPHOPLASTY N/A 12/17/2016   Procedure: KYPHOPLASTY;  Surgeon: Hessie Knows, MD;  Location: [facility] ORS;  Service: Orthopedics;  Laterality: N/A;  l4  . LUMBAR LAMINECTOMY  05/09/2017   L2-L5  . VISCERAL ANGIOGRAPHY N/A 08/15/2017   Procedure: VISCERAL ANGIOGRAPHY;  Surgeon: Algernon Huxley, MD;  Location: [place] CV LAB;  Service: Cardiovascular;  Laterality: N/A;  . VISCERAL ANGIOGRAPHY N/A 10/05/2017   Procedure: VISCERAL ANGIOGRAPHY;  Surgeon: Algernon Huxley, MD;  Location: [place] CV LAB;  Service: Cardiovascular;  Laterality: N/A;  . VISCERAL ARTERY INTERVENTION N/A 08/15/2017   Procedure: VISCERAL ARTERY INTERVENTION;  Surgeon: Algernon Huxley, MD;  Location: [place] CV LAB;  Service: Cardiovascular;  Laterality: N/A;    Current Medications: Current Meds  Medication Sig  . acetaminophen (TYLENOL) 500 MG tablet Take 500 mg by mouth every 6 (six) hours as needed for mild pain.  . Alfalfa 500 MG TABS Take 500 mg by mouth daily.   . Ascorbic Acid (VITAMIN C CR) 500 MG CPCR Take 500 mg by mouth  daily.  . aspirin EC 81 MG EC tablet Take 1 tablet (81 mg total) by mouth every evening.  [name] atorvastatin (LIPITOR) 10 MG tablet Take 1 tablet (10 mg total) by mouth every evening.  . Calcium Carbonate-Vitamin D 600-200 MG-UNIT CAPS Take by mouth daily.   . Cholecalciferol (VITAMIN D3) 3000 UNITS TABS Take 3,000 Units by mouth 2 (two) times daily.   [name] CINNAMON PO Take 1,000 mg by mouth daily.   . clotrimazole-betamethasone (LOTRISONE) cream Apply topically 2 (two) times daily. (Patient taking differently: Apply topically 2 (two) times daily as needed. )  . co-enzyme Q-10 30 MG capsule Take 30 mg by mouth daily.   . diclofenac sodium (VOLTAREN) 1 % GEL Apply topically as  needed.   . fluticasone (FLONASE) 50 MCG/ACT nasal spray Place 1 spray into both nostrils daily.  . Ginkgo Biloba (GNP GINGKO BILOBA EXTRACT PO) Take 1 tablet by mouth daily.  . Glucosamine Sulfate 500 MG CAPS Take 500 mg by mouth daily.  . isosorbide mononitrate (IMDUR) 30 MG 24 hr tablet Take 0.5 tablets (15 mg total) by mouth daily.  . Lactobacillus (PROBIOTIC ACIDOPHILUS PO) Take 1 capsule by mouth daily.   [name] loratadine (CLARITIN) 10 MG tablet Take 10 mg by mouth daily as needed for allergies.  . Magnesium 400 MG CAPS Take 400 mg by mouth daily.  . metoprolol succinate (TOPROL XL) 25 MG 24 hr tablet Take 0.5 tablets (12.5 mg total) by mouth daily.  . Multiple Vitamin (MULTIVITAMIN) capsule Take 1 capsule by mouth daily.  . Omega-3 Fatty Acids (OMEGA 3 PO) Take 520 mg by mouth daily.   . pantoprazole (PROTONIX) 40 MG tablet Take 1 tablet (40 mg total) by mouth daily.  . Pramoxine-HC (HYDROCORTISONE ACE-PRAMOXINE) 2.5-1 % CREA Apply topically as needed.   . Turmeric 450 MG CAPS Take 450 Units by mouth daily.   . vitamin B-12 (CYANOCOBALAMIN) 1000 MCG tablet Take 1,000 mcg by mouth daily.     Allergies:   Ibandronic acid, Other, Prednisone, Actonel [risedronate], Raloxifene, Versed [midazolam], and Doxycycline   Social History   Socioeconomic History  . Marital status: Widowed    Spouse name: Gwyndolyn Saxon  . Number of children: 3  . Years of education: some college  . Highest education level: 12th grade  Occupational History    Employer: RETIRED  Tobacco Use  . Smoking status: Never Smoker  . Smokeless tobacco: Never Used  . Tobacco comment: smoking cessation materials not required  Vaping Use  . Vaping Use: Never used  Substance and Sexual Activity  . Alcohol use: No    Alcohol/week: 0.0 standard drinks  . Drug use: No  . Sexual activity: Not Currently  Other Topics Concern  . Not on file  Social History Narrative  . Not on file   Social Determinants of Health   Financial  Resource Strain:   . Difficulty of Paying Living Expenses:   Food Insecurity:   . Worried About Charity fundraiser in the Last Year:   . Arboriculturist in the Last Year:   Transportation Needs:   . Film/video editor (Medical):   [name] Lack of Transportation (Non-Medical):   Physical Activity: Sufficiently Active  . Days of Exercise per Week: 7 days  . Minutes of Exercise per Session: 30 min  Stress:   . Feeling of Stress :   Social Connections:   . Frequency of Communication with Friends and Family:   . Frequency of Social Gatherings  with Friends and Family:   . Attends Religious Services:   . Active Member of Clubs or Organizations:   . Attends Archivist Meetings:   [name] Marital Status:      Family History: The patient's family history includes Alcohol abuse in her sister; Cancer in her brother and brother; Diabetes in her brother, maternal grandmother, and sister; Heart disease in her maternal grandmother and mother; Heart disease (age of onset: 33) in her brother; Hypertension in her father; Kidney disease in her sister; Stroke in her brother.  ROS:   Please see the history of present illness.     All other systems reviewed and are negative.  EKGs/Labs/Other Studies Reviewed:    The following studies were reviewed today:   EKG:  EKG is  ordered today.  The ekg ordered today demonstrates sinus rhythm, normal ECG.  Recent Labs: 03/12/2020: TSH 2.22 03/25/2020: ALT 24 03/27/2020: BUN 21; Creatinine, Ser 0.59; Hemoglobin 14.0; Platelets 189; Potassium 4.0; Sodium 139  Recent Lipid Panel    Component Value Date/Time   CHOL 137 04/03/2020 1003   CHOL 205 (H) 12/19/2015 1422   TRIG 95.0 04/03/2020 1003   HDL 62.80 04/03/2020 1003   HDL 84 12/19/2015 1422   CHOLHDL 2 04/03/2020 1003   VLDL 19.0 04/03/2020 1003   LDLCALC 55 04/03/2020 1003   LDLCALC 63 01/01/2019 1238    Physical Exam:    VS:  BP (!) 146/90 (BP Location: Right Arm, Patient Position: Sitting,  Cuff Size: Normal)   Pulse 64   Ht 5' (1.524 m)   Wt 103 lb 8 oz (46.9 kg)   SpO2 96%   BMI 20.21 kg/m     Wt Readings from Last 3 Encounters:  04/18/20 103 lb 8 oz (46.9 kg)  04/03/20 101 lb 9.6 oz (46.1 kg)  03/27/20 99 lb 3.2 oz (45 kg)     GEN:  Well nourished, well developed in no acute distress HEENT: Normal NECK: No JVD; No carotid bruits LYMPHATICS: No lymphadenopathy CARDIAC: RRR, no murmurs, rubs, gallops RESPIRATORY:  Clear to auscultation without rales, wheezing or rhonchi  ABDOMEN: Soft, non-tender, non-distended MUSCULOSKELETAL:  No edema; No deformity  SKIN: Warm and dry NEUROLOGIC:  Alert and oriented x 3 PSYCHIATRIC:  Normal affect   ASSESSMENT:    1. Chest pain, unspecified type   2. Essential hypertension   3. Pure hypercholesterolemia    PLAN:    In order of problems listed above:  1. Patient with one episode of chest discomfort.  Has risk factors of age, hyperlipidemia, hypertension.  I counseled patient to take Imdur as prescribed for antianginal benefit.  Also counseled patient to decide on who to follow-up with.  It is totally okay to follow-up with [facility] clinic for cardiac eval and follow-up as she has been.  It is ill advised to have two cardiologists.  Agrees and understands.  For now she will follow-up with [facility].  If she is to follow-up with [place], she will call the office for further management.  If that is the case, we will plan for a Lexi Myoview. 2. hx of hypertension, continue current medications. 3. History of hyperlipidemia, last cholesterol levels at goal.  On statin.  Follow-up as needed if she decides to follow-up with [place] from a cardiac perspective.   Medication Adjustments/Labs and Tests Ordered: Current medicines are reviewed at length with the patient today.  Concerns regarding medicines are outlined above.  Orders Placed This Encounter  Procedures  .  EKG 12-Lead   No orders of the defined types were placed in this  encounter.   Patient Instructions  Medication Instructions:  No changes  *If you need a refill on your cardiac medications before your next appointment, please call your pharmacy*   Lab Work: None  If you have labs (blood work) drawn today and your tests are completely normal, you will receive your results only by: [name] MyChart Message (if you have MyChart) OR . A paper copy in the mail If you have any lab test that is abnormal or we need to change your treatment, we will call you to review the results.   Testing/Procedures: None   Follow-Up: At [facility], you and your health needs are our priority.  As part of our continuing mission to provide you with exceptional heart care, we have created designated Provider Care Teams.  These Care Teams include your primary Cardiologist (physician) and Advanced Practice Providers (APPs -  Physician Assistants and Nurse Practitioners) who all work together to provide you with the care you need, when you need it.  We recommend signing up for the patient portal called "MyChart".  Sign up information is provided on this After Visit Summary.  MyChart is used to connect with patients for Virtual Visits (Telemedicine).  Patients are able to view lab/test results, encounter notes, upcoming appointments, etc.  Non-urgent messages can be sent to your provider as well.   To learn more about what you can do with MyChart, go to [email or web address].    Your next appointment:   Follow up as needed.    Signed, Kate Sable, MD  04/18/2020 11:54 AM    [place]

## 2020-04-18 NOTE — Patient Instructions (Signed)

## 2020-04-22 ENCOUNTER — Telehealth: Payer: Self-pay | Admitting: Internal Medicine

## 2020-04-22 MED ORDER — NITROGLYCERIN 0.4 MG SL SUBL
0.4000 mg | SUBLINGUAL_TABLET | SUBLINGUAL | 3 refills | Status: DC | PRN
Start: 2020-04-22 — End: 2021-12-14

## 2020-04-22 NOTE — Telephone Encounter (Signed)
LMTCB

## 2020-04-22 NOTE — Telephone Encounter (Signed)
Patient called and  she saw Dr. Derrel Nip in May. Patient said that Dr. Derrel Nip was going to call in a prescription for nitroglycerin.

## 2020-04-22 NOTE — Telephone Encounter (Signed)
I see where this was  talked about in her AVS at her last appt. Is it okay if I send rx in?

## 2020-04-22 NOTE — Telephone Encounter (Signed)
Spoke with pt and she stated that she is taking the Imdur. She stated that she just called because she knew you had mentioned sending in the rx when she was here last and her pharmacy still did not have. She stated that she is not needing it because she is having chest pain she just wanted to have it on hand in case. Pt stated that she has not had any more angina.

## 2020-04-22 NOTE — Telephone Encounter (Signed)
I just sent it In NTG .  Is she taking the Imdur that the cardiologist recommended she start ? This medication is a long acting nitroglycerin

## 2020-04-23 ENCOUNTER — Telehealth: Payer: Self-pay | Admitting: Cardiology

## 2020-04-23 DIAGNOSIS — R079 Chest pain, unspecified: Secondary | ICD-10-CM

## 2020-04-23 NOTE — Telephone Encounter (Signed)
Patient states that she was currently seeing Dr. Serafina Royals, and he had ordered some testing. Patient would like for Dr. Garen Lah to order and perform these tests. Please call to discuss.

## 2020-04-24 NOTE — Telephone Encounter (Signed)
Spoke with patient. Reports she has decided to switch her cardiologist to Dr. Garen Lah from Dr Nehemiah Massed at [facility]. She has already cancelled her appointments with him. She would like to proceed with recommended testing as advised by Dr Garen Lah.  From OV on 04/18/20 with Dr. Garen Lah: ".  If she is to follow-up with [place], she will call the office for further management.  If that is the case, we will plan for a Lexi Myoview."  Also, wants Dr. Garen Lah to know that she does not need strong medicine and does not tolerate the normal doses of medicine well. She usually likes to start off with the lowest dose necessary.  Routing to Dr. Garen Lah to confirm ok to proceed with [facility].

## 2020-04-25 ENCOUNTER — Other Ambulatory Visit: Payer: Self-pay | Admitting: Cardiology

## 2020-04-25 ENCOUNTER — Telehealth: Payer: Self-pay | Admitting: Cardiology

## 2020-04-25 MED ORDER — METOPROLOL SUCCINATE ER 25 MG PO TB24: 12.5000 mg | ORAL_TABLET | Freq: Every day | ORAL | 3 refills | Status: DC

## 2020-04-25 MED ORDER — ISOSORBIDE MONONITRATE ER 30 MG PO TB24: 15.0000 mg | ORAL_TABLET | Freq: Every day | ORAL | 3 refills | Status: DC

## 2020-04-25 NOTE — Telephone Encounter (Signed)
Kate Sable, MD  You; Kavin Leech, RN Yesterday (12:57 PM)   Okay for patient to obtain Myoview. Please schedule patient for Myoview, schedule follow-up appointment with me after the stress test. Thank you     Myoview order entered.  No answer. Left message to call back.

## 2020-04-25 NOTE — Telephone Encounter (Signed)
. *  STAT* If patient is at the pharmacy, call can be transferred to refill team.   1. Which medications need to be refilled? (please list name of each medication and dose if known)   Isosorbide 15 mg po q d  Metoprolol 12.5 mg po q d   2. Which pharmacy/location (including street and city if local pharmacy) is medication to be sent to?  [facility] [address] [place]   3. Do they need a 30 day or 90 day supply? [place]

## 2020-04-25 NOTE — Telephone Encounter (Signed)
Patient scheduled for lexi and wants instructions.

## 2020-04-25 NOTE — Telephone Encounter (Signed)
Call to patient to review results. All questions were answered. Instructions reviewed verbally for testing next week.    [place]  Your caregiver has ordered a Stress Test with nuclear imaging. The purpose of this test is to evaluate the blood supply to your heart muscle. This procedure is referred to as a "Non-Invasive Stress Test." This is because other than having an IV started in your vein, nothing is inserted or "invades" your body. Cardiac stress tests are done to find areas of poor blood flow to the heart by determining the extent of coronary artery disease (CAD). Some patients exercise on a treadmill, which naturally increases the blood flow to your heart, while others who are  unable to walk on a treadmill due to physical limitations have a pharmacologic/chemical stress agent called Lexiscan . This medicine will mimic walking on a treadmill by temporarily increasing your coronary blood flow.   Please note: these test may take anywhere between 2-4 hours to complete  PLEASE REPORT TO [facility] AT THE FIRST DESK WILL DIRECT YOU WHERE TO GO  Date of Procedure:__________6/22___________________________  Arrival Time for Procedure:___________7:30A___________________  Instructions regarding medication:   __x__:  Hold betablocker(s) night before procedure and morning of procedure (metoprolol)   PLEASE NOTIFY THE OFFICE AT LEAST 24 HOURS IN ADVANCE IF YOU ARE UNABLE TO KEEP YOUR APPOINTMENT.  [phone] AND  PLEASE NOTIFY NUCLEAR MEDICINE AT [facility] AT LEAST 24 HOURS IN ADVANCE IF YOU ARE UNABLE TO KEEP YOUR APPOINTMENT. [phone]  How to prepare for your Myoview test:  1. Do not eat or drink after midnight 2. No caffeine for 24 hours prior to test 3. No smoking 24 hours prior to test. 4. Your medication may be taken with water.  If your doctor stopped a medication because of this test, do not take that medication. 5. Ladies, please do not wear  dresses.  Skirts or pants are appropriate. Please wear a short sleeve shirt. 6. No perfume, cologne or lotion. 7. Wear comfortable walking shoes. No heels!

## 2020-04-28 ENCOUNTER — Telehealth: Payer: Self-pay | Admitting: Internal Medicine

## 2020-04-28 NOTE — Telephone Encounter (Signed)
Left message for patient to call back and schedule Medicare Annual Wellness Visit (AWV) either virtually or audio only.  Last AWV 05/03/19; please schedule 05/03/20 OR AFTER with Denisa O'Brien-Blaney at [facility].

## 2020-04-29 ENCOUNTER — Other Ambulatory Visit: Payer: Self-pay

## 2020-04-29 ENCOUNTER — Ambulatory Visit
Admission: RE | Admit: 2020-04-29 | Discharge: 2020-04-29 | Disposition: A | Discharge status: Home / Self Care | Payer: Medicare Other | Source: Ambulatory Visit | Attending: Cardiology | Admitting: Cardiology

## 2020-04-29 DIAGNOSIS — R079 Chest pain, unspecified: Secondary | ICD-10-CM | POA: Diagnosis present

## 2020-04-29 LAB — NM MYOCAR MULTI W/SPECT W/WALL MOTION / EF
LV dias vol: 64 mL (ref 46–106)
LV sys vol: 25 mL
Peak HR: 82 {beats}/min
Percent HR: 63 %
Rest HR: 55 {beats}/min
TID: 1.1

## 2020-04-29 MED ORDER — TECHNETIUM TC 99M TETROFOSMIN IV KIT
30.0000 | PACK | Freq: Once | INTRAVENOUS | Status: AC | PRN
  Administered 2020-04-29: 30.902 via INTRAVENOUS

## 2020-04-29 MED ORDER — REGADENOSON 0.4 MG/5ML IV SOLN
0.4000 mg | Freq: Once | INTRAVENOUS | Status: AC
  Administered 2020-04-29: 0.4 mg via INTRAVENOUS
  Filled 2020-04-29: qty 5

## 2020-04-29 MED ORDER — TECHNETIUM TC 99M TETROFOSMIN IV KIT
10.0000 | PACK | Freq: Once | INTRAVENOUS | Status: AC | PRN
  Administered 2020-04-29: 9.565 via INTRAVENOUS

## 2020-04-29 NOTE — Telephone Encounter (Signed)
Medications refilled by Verlon Au, RN on 04/25/2020.

## 2020-04-29 NOTE — Telephone Encounter (Signed)
Patient currently in nm having myoview.

## 2020-05-01 ENCOUNTER — Telehealth: Payer: Self-pay | Admitting: Cardiology

## 2020-05-01 NOTE — Telephone Encounter (Signed)
Patient calling in to check the status of her myoview results from 6/22

## 2020-05-01 NOTE — Telephone Encounter (Signed)
No answer. Left message letting her know we are waiting for the doctor to review the final result and will call her with the details at that time. Or she can call back if she would like.

## 2020-05-01 NOTE — Telephone Encounter (Signed)
Pt is scheduled for 05/05/20 @ 9:30am

## 2020-05-01 NOTE — Telephone Encounter (Signed)
Spoke with the patient. Advised her that Dr. Garen Lah is out of the office until 05/05/20. Reviewed the patients pre-lim myoview results.  Advised the patient that she will be contacted with the finalized results and Dr. Thereasa Solo recommendation once he returns and they are available.  Patient verbalized understanding and voiced appreciation for the call back.

## 2020-05-05 ENCOUNTER — Ambulatory Visit (INDEPENDENT_AMBULATORY_CARE_PROVIDER_SITE_OTHER): Payer: Medicare Other

## 2020-05-05 VITALS — Ht 60.0 in | Wt 103.0 lb

## 2020-05-05 DIAGNOSIS — Z0000 Encounter for general adult medical examination without abnormal findings: Secondary | ICD-10-CM | POA: Diagnosis not present

## 2020-05-05 NOTE — Patient Instructions (Addendum)
[name] , Thank you for taking time to come for your Medicare Wellness Visit. I appreciate your ongoing commitment to your health goals. Please review the following plan we discussed and let me know if I can assist you in the future.   These are the goals we discussed: Goals    . DIET - INCREASE WATER INTAKE     Stay hydrated       This is a list of the screening recommended for you and due dates:  Health Maintenance  Topic Date Due  . Flu Shot  06/08/2020  . Tetanus Vaccine  03/13/2029  . DEXA scan (bone density measurement)  Completed  . COVID-19 Vaccine  Completed  . Pneumonia vaccines  Completed    Immunizations Immunization History  Administered Date(s) Administered  . Fluad Quad(high Dose 65+) 08/07/2019  . Influenza, High Dose Seasonal PF 09/12/2018  . Influenza,inj,Quad PF,6+ Mos 08/15/2015  . Influenza-Unspecified 08/20/2011, 08/11/2012, 09/08/2013, 09/05/2014, 08/15/2015, 09/13/2016, 08/25/2017  . PFIZER SARS-COV-2 Vaccination 12/04/2019, 12/25/2019  . Pneumococcal Conjugate-13 12/13/2014  . Pneumococcal Polysaccharide-23 11/08/2000, 08/08/2006  . Tdap 11/08/2008, 03/14/2019  . Zoster 01/07/2004, 11/08/2008   Advanced directives: on file  Conditions/risks identified: none new  Follow up in one year for your annual wellness visit    Preventive Care 65 Years and Older, Female Preventive care refers to lifestyle choices and visits with your health care provider that can promote health and wellness. What does preventive care include?  A yearly physical exam. This is also called an annual well check.  Dental exams once or twice a year.  Routine eye exams. Ask your health care provider how often you should have your eyes checked.  Personal lifestyle choices, including:  Daily care of your teeth and gums.  Regular physical activity.  Eating a healthy diet.  Avoiding tobacco and drug use.  Limiting alcohol use.  Practicing safe sex.  Taking  low-dose aspirin every day.  Taking vitamin and mineral supplements as recommended by your health care provider. What happens during an annual well check? The services and screenings done by your health care provider during your annual well check will depend on your age, overall health, lifestyle risk factors, and family history of disease. Counseling  Your health care provider may ask you questions about your:  Alcohol use.  Tobacco use.  Drug use.  Emotional well-being.  Home and relationship well-being.  Sexual activity.  Eating habits.  History of falls.  Memory and ability to understand (cognition).  Work and work Statistician.  Reproductive health. Screening  You may have the following tests or measurements:  Height, weight, and BMI.  Blood pressure.  Lipid and cholesterol levels. These may be checked every 5 years, or more frequently if you are over 23 years old.  Skin check.  Lung cancer screening. You may have this screening every year starting at age 66 if you have a 30-pack-year history of smoking and currently smoke or have quit within the past 15 years.  Fecal occult blood test (FOBT) of the stool. You may have this test every year starting at age 54.  Flexible sigmoidoscopy or colonoscopy. You may have a sigmoidoscopy every 5 years or a colonoscopy every 10 years starting at age 45.  Hepatitis C blood test.  Hepatitis B blood test.  Sexually transmitted disease (STD) testing.  Diabetes screening. This is done by checking your blood sugar (glucose) after you have not eaten for a while (fasting). You may have this done every 1-3  years.  Bone density scan. This is done to screen for osteoporosis. You may have this done starting at age 47.  Mammogram. This may be done every 1-2 years. Talk to your health care provider about how often you should have regular mammograms. Talk with your health care provider about your test results, treatment options, and  if necessary, the need for more tests. Vaccines  Your health care provider may recommend certain vaccines, such as:  Influenza vaccine. This is recommended every year.  Tetanus, diphtheria, and acellular pertussis (Tdap, Td) vaccine. You may need a Td booster every 10 years.  Zoster vaccine. You may need this after age 57.  Pneumococcal 13-valent conjugate (PCV13) vaccine. One dose is recommended after age 72.  Pneumococcal polysaccharide (PPSV23) vaccine. One dose is recommended after age 39. Talk to your health care provider about which screenings and vaccines you need and how often you need them. This information is not intended to replace advice given to you by your health care provider. Make sure you discuss any questions you have with your health care provider. Document Released: 11/21/2015 Document Revised: 07/14/2016 Document Reviewed: 08/26/2015 Elsevier Interactive Patient Education  2017 [place] Prevention in the Home Falls can cause injuries. They can happen to people of all ages. There are many things you can do to make your home safe and to help prevent falls. What can I do on the outside of my home?  Regularly fix the edges of walkways and driveways and fix any cracks.  Remove anything that might make you trip as you walk through a door, such as a raised step or threshold.  Trim any bushes or trees on the path to your home.  Use bright outdoor lighting.  Clear any walking paths of anything that might make someone trip, such as rocks or tools.  Regularly check to see if handrails are loose or broken. Make sure that both sides of any steps have handrails.  Any raised decks and porches should have guardrails on the edges.  Have any leaves, snow, or ice cleared regularly.  Use sand or salt on walking paths during winter.  Clean up any spills in your garage right away. This includes oil or grease spills. What can I do in the bathroom?  Use night  lights.  Install grab bars by the toilet and in the tub and shower. Do not use towel bars as grab bars.  Use non-skid mats or decals in the tub or shower.  If you need to sit down in the shower, use a plastic, non-slip stool.  Keep the floor dry. Clean up any water that spills on the floor as soon as it happens.  Remove soap buildup in the tub or shower regularly.  Attach bath mats securely with double-sided non-slip rug tape.  Do not have throw rugs and other things on the floor that can make you trip. What can I do in the bedroom?  Use night lights.  Make sure that you have a light by your bed that is easy to reach.  Do not use any sheets or blankets that are too big for your bed. They should not hang down onto the floor.  Have a firm chair that has side arms. You can use this for support while you get dressed.  Do not have throw rugs and other things on the floor that can make you trip. What can I do in the kitchen?  Clean up any spills right away.  Avoid walking on wet floors.  Keep items that you use a lot in easy-to-reach places.  If you need to reach something above you, use a strong step stool that has a grab bar.  Keep electrical cords out of the way.  Do not use floor polish or wax that makes floors slippery. If you must use wax, use non-skid floor wax.  Do not have throw rugs and other things on the floor that can make you trip. What can I do with my stairs?  Do not leave any items on the stairs.  Make sure that there are handrails on both sides of the stairs and use them. Fix handrails that are broken or loose. Make sure that handrails are as long as the stairways.  Check any carpeting to make sure that it is firmly attached to the stairs. Fix any carpet that is loose or worn.  Avoid having throw rugs at the top or bottom of the stairs. If you do have throw rugs, attach them to the floor with carpet tape.  Make sure that you have a light switch at the  top of the stairs and the bottom of the stairs. If you do not have them, ask someone to add them for you. What else can I do to help prevent falls?  Wear shoes that:  Do not have high heels.  Have rubber bottoms.  Are comfortable and fit you well.  Are closed at the toe. Do not wear sandals.  If you use a stepladder:  Make sure that it is fully opened. Do not climb a closed stepladder.  Make sure that both sides of the stepladder are locked into place.  Ask someone to hold it for you, if possible.  Clearly mark and make sure that you can see:  Any grab bars or handrails.  First and last steps.  Where the edge of each step is.  Use tools that help you move around (mobility aids) if they are needed. These include:  Canes.  Walkers.  Scooters.  Crutches.  Turn on the lights when you go into a dark area. Replace any light bulbs as soon as they burn out.  Set up your furniture so you have a clear path. Avoid moving your furniture around.  If any of your floors are uneven, fix them.  If there are any pets around you, be aware of where they are.  Review your medicines with your doctor. Some medicines can make you feel dizzy. This can increase your chance of falling. Ask your doctor what other things that you can do to help prevent falls. This information is not intended to replace advice given to you by your health care provider. Make sure you discuss any questions you have with your health care provider. Document Released: 08/21/2009 Document Revised: 04/01/2016 Document Reviewed: 11/29/2014 Elsevier Interactive Patient Education  2017 [employer].

## 2020-05-05 NOTE — Progress Notes (Addendum)
Subjective:   [name] is a 86 y.o. female who presents for Medicare Annual (Subsequent) preventive examination.  Review of Systems    No ROS.  Medicare Wellness Virtual Visit.     Cardiac Risk Factors include: advanced age (>5men, >23 women)     Objective:    Today's Vitals   05/05/20 0936  Weight: 103 lb (46.7 kg)  Height: 5' (1.524 m)   Body mass index is 20.12 kg/m.  Advanced Directives 05/05/2020 03/26/2020 03/25/2020 05/03/2019 04/28/2018 10/05/2017 08/30/2017  Does Patient Have a Medical Advance Directive? Yes Yes Yes Yes Yes Yes Yes  Type of Paramedic of [place];Living will Healthcare Power of [place] of [place];Living will [place];Living will [place];Living will [place];Living will  Does patient want to make changes to medical advance directive? No - Patient declined No - Patient declined - - - No - Patient declined -  Copy of [place] in Chart? Yes - validated most recent copy scanned in chart (See row information) No - copy requested - Yes - validated most recent copy scanned in chart (See row information) Yes No - copy requested -    Current Medications (verified) Outpatient Encounter Medications as of 05/05/2020  Medication Sig  . acetaminophen (TYLENOL) 500 MG tablet Take 500 mg by mouth every 6 (six) hours as needed for mild pain.  . Alfalfa 500 MG TABS Take 500 mg by mouth daily.   . Ascorbic Acid (VITAMIN C CR) 500 MG CPCR Take 500 mg by mouth daily.  [name] aspirin EC 81 MG EC tablet Take 1 tablet (81 mg total) by mouth every evening.  [name] atorvastatin (LIPITOR) 10 MG tablet Take 1 tablet (10 mg total) by mouth every evening.  . Calcium Carbonate-Vitamin D 600-200 MG-UNIT CAPS Take by mouth daily.   . Cholecalciferol (VITAMIN D3) 3000 UNITS TABS Take 3,000 Units by mouth 2 (two) times daily.   [name] CINNAMON  PO Take 1,000 mg by mouth daily.   . clotrimazole-betamethasone (LOTRISONE) cream Apply topically 2 (two) times daily. (Patient taking differently: Apply topically 2 (two) times daily as needed. )  . co-enzyme Q-10 30 MG capsule Take 30 mg by mouth daily.   . diclofenac sodium (VOLTAREN) 1 % GEL Apply topically as needed.   . fluticasone (FLONASE) 50 MCG/ACT nasal spray Place 1 spray into both nostrils daily.  . Ginkgo Biloba (GNP GINGKO BILOBA EXTRACT PO) Take 1 tablet by mouth daily.  . Glucosamine Sulfate 500 MG CAPS Take 500 mg by mouth daily.  . isosorbide mononitrate (IMDUR) 30 MG 24 hr tablet Take 0.5 tablets (15 mg total) by mouth daily.  . Lactobacillus (PROBIOTIC ACIDOPHILUS PO) Take 1 capsule by mouth daily.   [name] loratadine (CLARITIN) 10 MG tablet Take 10 mg by mouth daily as needed for allergies.  . Magnesium 400 MG CAPS Take 400 mg by mouth daily.  . metoprolol succinate (TOPROL XL) 25 MG 24 hr tablet Take 0.5 tablets (12.5 mg total) by mouth daily.  . Multiple Vitamin (MULTIVITAMIN) capsule Take 1 capsule by mouth daily.  . nitroGLYCERIN (NITROSTAT) 0.4 MG SL tablet Place 1 tablet (0.4 mg total) under the tongue every 5 (five) minutes as needed for chest pain.  . Omega-3 Fatty Acids (OMEGA 3 PO) Take 520 mg by mouth daily.   . pantoprazole (PROTONIX) 40 MG tablet Take 1 tablet (40 mg total) by mouth daily.  [name]  Pramoxine-HC (HYDROCORTISONE ACE-PRAMOXINE) 2.5-1 % CREA Apply topically as needed.   . Turmeric 450 MG CAPS Take 450 Units by mouth daily.   . vitamin B-12 (CYANOCOBALAMIN) 1000 MCG tablet Take 1,000 mcg by mouth daily.   No facility-administered encounter medications on file as of 05/05/2020.    Allergies (verified) Ibandronic acid, Other, Prednisone, Actonel [risedronate], Raloxifene, Versed [midazolam], and Doxycycline   History: Past Medical History:  Diagnosis Date  . Allergy   . Arthritis   . Atherosclerosis   . Atherosclerosis of abdominal aorta ([place])   .  Chronic kidney disease    stage 2  . CKD (chronic kidney disease) stage 2, GFR 60-89 ml/min   . Collagen vascular disease ([place])   . Diverticulosis 2008  . GERD (gastroesophageal reflux disease)   . Heart murmur   . Hyperlipidemia   . Lumbar spinal stenosis   . Osteoporosis   . Ovarian lump   . RA (rheumatoid arthritis) ([place])   . Raynaud's disease   . Rheumatoid arthritis ([employer])   . Spinal stenosis    Past Surgical History:  Procedure Laterality Date  . BREAST SURGERY Left 1965   Benign biopsy  . CHOLECYSTECTOMY  2005   [place]  . COLONOSCOPY  2008   Dr. Donnella Sham  . EXCISION OF BREAST BIOPSY Left 1965   benign  . EYE SURGERY Bilateral    Cataract Extraction with IOL  . KYPHOPLASTY N/A 12/17/2016   Procedure: KYPHOPLASTY;  Surgeon: Hessie Knows, MD;  Location: [facility] ORS;  Service: Orthopedics;  Laterality: N/A;  l4  . LUMBAR LAMINECTOMY  05/09/2017   L2-L5  . VISCERAL ANGIOGRAPHY N/A 08/15/2017   Procedure: VISCERAL ANGIOGRAPHY;  Surgeon: Algernon Huxley, MD;  Location: [facility];  Service: Cardiovascular;  Laterality: N/A;  . VISCERAL ANGIOGRAPHY N/A 10/05/2017   Procedure: VISCERAL ANGIOGRAPHY;  Surgeon: Algernon Huxley, MD;  Location: [facility];  Service: Cardiovascular;  Laterality: N/A;  . VISCERAL ARTERY INTERVENTION N/A 08/15/2017   Procedure: VISCERAL ARTERY INTERVENTION;  Surgeon: Algernon Huxley, MD;  Location: [facility];  Service: Cardiovascular;  Laterality: N/A;   Family History  Problem Relation Age of Onset  . Heart disease Mother   . Hypertension Father   . Heart disease Brother 25       heart attack  . Cancer Brother        bladder  . Diabetes Sister   . Alcohol abuse Sister   . Kidney disease Sister   . Diabetes Brother   . Stroke Brother   . Diabetes Maternal Grandmother   . Heart disease Maternal Grandmother   . Cancer Brother        lung   Social History   Socioeconomic History  . Marital status: Widowed    Spouse  name: Gwyndolyn Saxon  . Number of children: 3  . Years of education: some college  . Highest education level: 12th grade  Occupational History    Employer: RETIRED  Tobacco Use  . Smoking status: Never Smoker  . Smokeless tobacco: Never Used  . Tobacco comment: smoking cessation materials not required  Vaping Use  . Vaping Use: Never used  Substance and Sexual Activity  . Alcohol use: No    Alcohol/week: 0.0 standard drinks  . Drug use: No  . Sexual activity: Not Currently  Other Topics Concern  . Not on file  Social History Narrative  . Not on file   Social Determinants of Health   Financial  Resource Strain:   . Difficulty of Paying Living Expenses:   Food Insecurity:   . Worried About Charity fundraiser in the Last Year:   . Arboriculturist in the Last Year:   Transportation Needs:   . Film/video editor (Medical):   [name] Lack of Transportation (Non-Medical):   Physical Activity:   . Days of Exercise per Week:   . Minutes of Exercise per Session:   Stress:   . Feeling of Stress :   Social Connections:   . Frequency of Communication with Friends and Family:   . Frequency of Social Gatherings with Friends and Family:   . Attends Religious Services:   . Active Member of Clubs or Organizations:   . Attends Archivist Meetings:   [name] Marital Status:     Tobacco Counseling Counseling given: Not Answered Comment: smoking cessation materials not required   Clinical Intake:  Pre-visit preparation completed: Yes           How often do you need to have someone help you when you read instructions, pamphlets, or other written materials from your doctor or pharmacy?: 1 - Never   Interpreter Needed?: No      Activities of Daily Living In your present state of health, do you have any difficulty performing the following activities: 05/05/2020 03/26/2020  Hearing? Y N  Comment Hearing aids -  Vision? N N  Difficulty concentrating or making decisions? N N    Walking or climbing stairs? N N  Dressing or bathing? N N  Doing errands, shopping? N N  Preparing Food and eating ? N -  Using the Toilet? N -  In the past six months, have you accidently leaked urine? Y -  Comment Managed with daily garment -  Do you have problems with loss of bowel control? N -  Managing your Medications? N -  Managing your Finances? N -  Housekeeping or managing your Housekeeping? N -  Some recent data might be hidden    Patient Care Team: Crecencio Mc, MD as PCP - General (Internal Medicine) Dingeldein, Remo Lipps, MD (Ophthalmology) Clyde Canterbury, MD as Referring Physician (Otolaryngology) Corey Skains, MD as Consulting Physician (Cardiology) Oneta Rack, MD as Consulting Physician (Dermatology) Lucky Cowboy Erskine Squibb, MD as Consulting Physician (Vascular Surgery)  Indicate any recent Medical Services you may have received from other than [facility] providers in the past year (date may be approximate).     Assessment:   This is a routine wellness examination for Zianne.  I connected with Tejal today by telephone and verified that I am speaking with the correct person using two identifiers. Location patient: home Location provider: work Persons participating in the virtual visit: patient, Marine scientist.    I discussed the limitations, risks, security and privacy concerns of performing an evaluation and management service by telephone and the availability of in person appointments. The patient expressed understanding and verbally consented to this telephonic visit.    Interactive audio and video telecommunications were attempted between this provider and patient, however failed, due to patient having technical difficulties OR patient did not have access to video capability.  We continued and completed visit with audio only.  Some vital signs may be absent or patient reported.   Hearing/Vision screen  Hearing Screening   125Hz  250Hz  500Hz  1000Hz  2000Hz  3000Hz  4000Hz   6000Hz  8000Hz   Right ear:           Left ear:  Comments: Hearing aids  Vision Screening Comments: Followed by [facility], Dr. Jeni Salles. Wears corrective lenses Visual acuity not assessed, virtual visit.  They have seen their ophthalmologist in the last 12 months.     Dietary issues and exercise activities discussed: Current Exercise Habits: Home exercise routine, Intensity: Mild3 meals daily, snacks between Fair water intake  Goals    . DIET - INCREASE WATER INTAKE     Stay hydrated      Depression Screen PHQ 2/9 Scores 05/05/2020 09/19/2019 05/14/2019 05/03/2019 03/14/2019 01/17/2019 11/03/2018  PHQ - 2 Score 0 0 0 1 0 0 0  PHQ- 9 Score - - 0 3 0 - 0    Fall Risk Fall Risk  05/05/2020 04/03/2020 03/12/2020 09/19/2019 05/14/2019  Falls in the past year? 1 1 1  0 0  Comment None since last reported 2 weeks ago - - - -  Number falls in past yr: - 0 0 - 0  Injury with Fall? - 1 0 - 0  Risk for fall due to : - History of fall(s) - - -  Risk for fall due to: Comment - - - - -  Follow up Falls evaluation completed Falls evaluation completed Falls evaluation completed Falls evaluation completed Falls evaluation completed    Handrails in use when climbing stairs? Yes  Home free of loose throw rugs in walkways, pet beds, electrical cords, etc? Yes  Adequate lighting in your home to reduce risk of falls? Yes   ASSISTIVE DEVICES UTILIZED TO PREVENT FALLS:  Life alert? Yes Home alarm system? Yes Use of a cane, walker or w/c? No  Grab bars in the bathroom? No  Shower chair or bench in shower? No  Elevated toilet seat or a handicapped toilet? Yes  TIMED UP AND GO:  Was the test performed? No . Virtual visit.  Cognitive Function:     6CIT Screen 05/05/2020 05/03/2019 04/28/2018 04/19/2017  What Year? 0 points 0 points 0 points 0 points  What month? 0 points 0 points 0 points 0 points  What time? - 0 points 0 points 0 points  Count back from 20 - 0 points 0 points 0  points  Months in reverse 0 points 0 points 0 points 0 points  Repeat phrase - 0 points 2 points 0 points  Total Score - 0 2 0   Immunizations Immunization History  Administered Date(s) Administered  . Fluad Quad(high Dose 65+) 08/07/2019  . Influenza, High Dose Seasonal PF 09/12/2018  . Influenza,inj,Quad PF,6+ Mos 08/15/2015  . Influenza-Unspecified 08/20/2011, 08/11/2012, 09/08/2013, 09/05/2014, 08/15/2015, 09/13/2016, 08/25/2017  . PFIZER SARS-COV-2 Vaccination 12/04/2019, 12/25/2019  . Pneumococcal Conjugate-13 12/13/2014  . Pneumococcal Polysaccharide-23 11/08/2000, 08/08/2006  . Tdap 11/08/2008, 03/14/2019  . Zoster 01/07/2004, 11/08/2008    Health Maintenance There are no preventive care reminders to display for this patient. Health Maintenance  Topic Date Due  . INFLUENZA VACCINE  06/08/2020  . TETANUS/TDAP  03/13/2029  . DEXA SCAN  Completed  . COVID-19 Vaccine  Completed  . PNA vac Low Risk Adult  Completed   Lung Cancer Screening: (Low Dose CT Chest recommended if Age 84-80 years, 30 pack-year currently smoking OR have quit w/in 15years.) does not qualify.   Dental Screening: Recommended annual dental exams for proper oral hygiene. Wears dentures.   Community Resource Referral / Chronic Care Management: CRR required this visit?  No  CCM required this visit?  No     Plan:   Keep all routine maintenance appointments.  I have personally reviewed and noted the following in the patient's chart:   . Medical and social history . Use of alcohol, tobacco or illicit drugs  . Current medications and supplements . Functional ability and status . Nutritional status . Physical activity . Advanced directives . List of other physicians . Hospitalizations, surgeries, and ER visits in previous 12 months . Vitals . Screenings to include cognitive, depression, and falls . Referrals and appointments  In addition, I have reviewed and discussed with patient certain  preventive protocols, quality metrics, and best practice recommendations. A written personalized care plan for preventive services as well as general preventive health recommendations were provided to patient via mail.     OBrien-Blaney, Maks Cavallero L, LPN   [id number]     I have reviewed the above information and agree with above.   Deborra Medina, MD

## 2020-05-06 ENCOUNTER — Telehealth: Payer: Self-pay

## 2020-05-06 NOTE — Telephone Encounter (Signed)
Call to patient to review labs.    Pt verbalized understanding and has no further questions at this time.    Advised pt to call for any further questions or concerns.  No further orders.   

## 2020-05-06 NOTE — Telephone Encounter (Signed)
-----   Message from Kate Sable, MD sent at 05/05/2020  6:36 PM EDT ----- Normal stress test.  No evidence for ischemia.

## 2020-05-09 ENCOUNTER — Encounter (INDEPENDENT_AMBULATORY_CARE_PROVIDER_SITE_OTHER): Payer: Medicare Other

## 2020-05-09 ENCOUNTER — Ambulatory Visit (INDEPENDENT_AMBULATORY_CARE_PROVIDER_SITE_OTHER): Payer: Medicare Other | Admitting: Nurse Practitioner

## 2020-05-13 ENCOUNTER — Other Ambulatory Visit (INDEPENDENT_AMBULATORY_CARE_PROVIDER_SITE_OTHER): Payer: Self-pay | Admitting: Nurse Practitioner

## 2020-05-13 DIAGNOSIS — M7989 Other specified soft tissue disorders: Secondary | ICD-10-CM

## 2020-05-13 DIAGNOSIS — R200 Anesthesia of skin: Secondary | ICD-10-CM

## 2020-05-21 ENCOUNTER — Ambulatory Visit (INDEPENDENT_AMBULATORY_CARE_PROVIDER_SITE_OTHER): Payer: Medicare Other

## 2020-05-21 ENCOUNTER — Other Ambulatory Visit: Payer: Self-pay

## 2020-05-21 ENCOUNTER — Encounter (INDEPENDENT_AMBULATORY_CARE_PROVIDER_SITE_OTHER): Payer: Self-pay | Admitting: Nurse Practitioner

## 2020-05-21 ENCOUNTER — Ambulatory Visit (INDEPENDENT_AMBULATORY_CARE_PROVIDER_SITE_OTHER): Payer: Medicare Other | Admitting: Nurse Practitioner

## 2020-05-21 VITALS — BP 154/70 | HR 63 | Resp 15 | Wt 102.0 lb

## 2020-05-21 DIAGNOSIS — I872 Venous insufficiency (chronic) (peripheral): Secondary | ICD-10-CM | POA: Diagnosis not present

## 2020-05-21 DIAGNOSIS — I8311 Varicose veins of right lower extremity with inflammation: Secondary | ICD-10-CM

## 2020-05-21 DIAGNOSIS — R200 Anesthesia of skin: Secondary | ICD-10-CM | POA: Diagnosis not present

## 2020-05-21 DIAGNOSIS — R1013 Epigastric pain: Secondary | ICD-10-CM | POA: Diagnosis not present

## 2020-05-21 DIAGNOSIS — E782 Mixed hyperlipidemia: Secondary | ICD-10-CM | POA: Diagnosis not present

## 2020-05-21 DIAGNOSIS — R600 Localized edema: Secondary | ICD-10-CM

## 2020-05-21 DIAGNOSIS — R202 Paresthesia of skin: Secondary | ICD-10-CM

## 2020-05-21 DIAGNOSIS — M62838 Other muscle spasm: Secondary | ICD-10-CM

## 2020-05-21 DIAGNOSIS — M7989 Other specified soft tissue disorders: Secondary | ICD-10-CM

## 2020-05-21 DIAGNOSIS — I8312 Varicose veins of left lower extremity with inflammation: Secondary | ICD-10-CM

## 2020-05-21 NOTE — Progress Notes (Signed)
Subjective:    Patient ID: [name], female    DOB: January 30, 1927, 86 y.o.   MRN: [id number] Chief Complaint  Patient presents with  . Follow-up    ultrasound follow up    [name] is a 86 year old female that presents today with concerns about her lower extremities.  The patient notes that she has been having muscle spasms throughout the evening however it is not consistent or predictable.  The patient's bigger concern is swelling that occurs to the end of the day.  The patient notes that both of her lower extremities swell, however her toes seem to swell worse.  The patient notes that the right lower extremity is worse than the left lower extremity.  The patient has fairly extensive, large palpable varicosities bilaterally.  The patient notes that there has not been any spontaneous bleeds however after a neck from her dog they have bled on occasion.  She does report that her legs are tired at times and occasionally sore however it is the swelling that is most uncomfortable for her.  She denies any fever, chills, nausea, vomiting or diarrhea.  The patient also notes that she has recently been having some abdominal pain.  She notes that this pain occurs in the evening after she lays down.  She notes that the pain feels as if it is moving through her esophagus into the epigastric region.  The patient denies any food phobia.  She denies any nausea or vomiting.  She denies any extensive pain after eating.  She denies any extensive weight loss.  The patient does have a known previous history of celiac artery disease.  The patient also reports having some shortness of breath that was recently worked up by her cardiologist.  Today noninvasive studies showed no evidence of DVT or superficial venous thrombosis bilaterally.  There is evidence of deep venous insufficiency noted in the bilateral lower extremities.  The patient also has evidence of superficial venous reflux bilaterally beginning in  the saphenofemoral junction extending to the proximal calf.  The patient also had ABIs done with a right ABI of 1.34 and a left of 1.22.  The right lower extremity has biphasic waveform to the left having triphasic tibial artery waveforms.  Toe waveforms are normal bilaterally.   Review of Systems  Respiratory: Positive for shortness of breath (With exertion).   Cardiovascular: Positive for leg swelling.  Gastrointestinal: Positive for abdominal pain.  All other systems reviewed and are negative.      Objective:   Physical Exam Vitals reviewed.  HENT:     Head: Normocephalic.  Cardiovascular:     Rate and Rhythm: Normal rate.     Pulses: Normal pulses.     Heart sounds: Normal heart sounds.     Comments: Large varicosities bilaterally about 4 to 5 mm Pulmonary:     Effort: Pulmonary effort is normal.  Abdominal:     General: Bowel sounds are normal.  Skin:    General: Skin is warm.  Neurological:     Mental Status: She is alert and oriented to person, place, and time.  Psychiatric:        Mood and Affect: Mood normal.        Behavior: Behavior normal.        Thought Content: Thought content normal.        Judgment: Judgment normal.     BP (!) 154/70 (BP Location: Right Arm)   Pulse 63   Resp 15  Wt 102 lb (46.3 kg)   BMI 19.92 kg/m   Past Medical History:  Diagnosis Date  . Allergy   . Arthritis   . Atherosclerosis   . Atherosclerosis of abdominal aorta ([place])   . Chronic kidney disease    stage 2  . CKD (chronic kidney disease) stage 2, GFR 60-89 ml/min   . Collagen vascular disease ([place])   . Diverticulosis 2008  . GERD (gastroesophageal reflux disease)   . Heart murmur   . Hyperlipidemia   . Lumbar spinal stenosis   . Osteoporosis   . Ovarian lump   . RA (rheumatoid arthritis) ([place])   . Raynaud's disease   . Rheumatoid arthritis ([place])   . Spinal stenosis     Social History   Socioeconomic History  . Marital status: Widowed    Spouse name:  Gwyndolyn [facility]  . Number of children: 3  . Years of education: some college  . Highest education level: 12th grade  Occupational History    Employer: RETIRED  Tobacco Use  . Smoking status: Never Smoker  . Smokeless tobacco: Never Used  . Tobacco comment: smoking cessation materials not required  Vaping Use  . Vaping Use: Never used  Substance and Sexual Activity  . Alcohol use: No    Alcohol/week: 0.0 standard drinks  . Drug use: No  . Sexual activity: Not Currently  Other Topics Concern  . Not on file  Social History Narrative  . Not on file   Social Determinants of Health   Financial Resource Strain:   . Difficulty of Paying Living Expenses:   Food Insecurity:   . Worried About Charity fundraiser in the Last Year:   . Arboriculturist in the Last Year:   Transportation Needs:   . Film/video editor (Medical):   [name] Lack of Transportation (Non-Medical):   Physical Activity:   . Days of Exercise per Week:   . Minutes of Exercise per Session:   Stress:   . Feeling of Stress :   Social Connections:   . Frequency of Communication with Friends and Family:   . Frequency of Social Gatherings with Friends and Family:   . Attends Religious Services:   . Active Member of Clubs or Organizations:   . Attends Archivist Meetings:   [name] Marital Status:   Intimate Partner Violence:   . Fear of Current or Ex-Partner:   . Emotionally Abused:   [name] Physically Abused:   . Sexually Abused:     Past Surgical History:  Procedure Laterality Date  . BREAST SURGERY Left 1965   Benign biopsy  . CHOLECYSTECTOMY  2005   [place]  . COLONOSCOPY  2008   Dr. Donnella Sham  . EXCISION OF BREAST BIOPSY Left 1965   benign  . EYE SURGERY Bilateral    Cataract Extraction with IOL  . KYPHOPLASTY N/A 12/17/2016   Procedure: KYPHOPLASTY;  Surgeon: Hessie Knows, MD;  Location: [facility];  Service: Orthopedics;  Laterality: N/A;  l4  . LUMBAR LAMINECTOMY  05/09/2017   L2-L5  . VISCERAL  ANGIOGRAPHY N/A 08/15/2017   Procedure: VISCERAL ANGIOGRAPHY;  Surgeon: Algernon Huxley, MD;  Location: [facility];  Service: Cardiovascular;  Laterality: N/A;  . VISCERAL ANGIOGRAPHY N/A 10/05/2017   Procedure: VISCERAL ANGIOGRAPHY;  Surgeon: Algernon Huxley, MD;  Location: [facility];  Service: Cardiovascular;  Laterality: N/A;  . VISCERAL ARTERY INTERVENTION N/A 08/15/2017   Procedure: VISCERAL ARTERY INTERVENTION;  Surgeon: Lucky Cowboy,  Erskine Squibb, MD;  Location: [facility];  Service: Cardiovascular;  Laterality: N/A;    Family History  Problem Relation Age of Onset  . Heart disease Mother   . Hypertension Father   . Heart disease Brother 26       heart attack  . Cancer Brother        bladder  . Diabetes Sister   . Alcohol abuse Sister   . Kidney disease Sister   . Diabetes Brother   . Stroke Brother   . Diabetes Maternal Grandmother   . Heart disease Maternal Grandmother   . Cancer Brother        lung    Allergies  Allergen Reactions  . Ibandronic Acid Other (See Comments)    Achy all over. Flu like S/S  . Other Other (See Comments)    Achy all over. Flu like S/S Dysphagia  . Prednisone Rash  . Actonel [Risedronate] Other (See Comments)    Dysphagia  . Amoxicillin Hives  . Raloxifene Other (See Comments)    Mood swings  . Versed [Midazolam] Other (See Comments)    Difficult waking up and memory loss  . Doxycycline Rash       Assessment & Plan:   1. Varicose veins of both lower extremities with inflammation  Recommend:  The patient has large symptomatic varicose veins that are painful and associated with swelling.  I have had a long discussion with the patient regarding  varicose veins and why they cause symptoms.  Patient will begin wearing graduated compression stockings class 1 on a daily basis, beginning first thing in the morning and removing them in the evening. The patient is instructed specifically not to sleep in the stockings.    The  patient  will also begin using over-the-counter analgesics such as Motrin 600 mg po TID to help control the symptoms.    In addition, behavioral modification including elevation during the day will be initiated.    Pending the results of these changes the  patient will be reevaluated in three months.   An  ultrasound of the venous system will be obtained.   Further plans will be based on the ultrasound results and whether conservative therapies are successful at eliminating the pain and swelling.   2. Mixed hyperlipidemia Continue statin as ordered and reviewed, no changes at this time   3. Epigastric pain The patient does have a previous history of celiac artery stenosis.  The patient was originally scheduled to have a follow-up visit in August of this year however we will move up her visit to rule out any possible vascular causes for her discomfort.  If this test is unrevealing we will plan on possible referral to gastroenterology.  4. Chronic venous insufficiency No surgery or intervention at this point in time.    I have had a long discussion with the patient regarding venous insufficiency and why it  causes symptoms. I have discussed with the patient the chronic skin changes that accompany venous insufficiency and the long term sequela such as infection and ulceration.  Patient will begin wearing graduated compression stockings class 1 (20-30 mmHg) or compression wraps on a daily basis a prescription was given. The patient will put the stockings on first thing in the morning and removing them in the evening. The patient is instructed specifically not to sleep in the stockings.    In addition, behavioral modification including several periods of elevation of the lower extremities during the day will  be continued. I have demonstrated that proper elevation is a position with the ankles at heart level.  The patient is instructed to begin routine exercise, especially walking on a daily  basis    The patient can be assessed for a Lymph Pump following trial of conservative therapy.  5. Muscle spasms of both lower extremities Recommend:  The patient is describing Charley horse type leg cramps.  ABIs were unremarkable. No invasive studies, angiography or surgery at this time.    I have reviewed homeopathic remedies such as Cider vinegar or mustard; placing a bar of soap at the bottom of the bed. Quinine is also an option Magnesium supplementation at bedtime was also reviewed.  The patient should continue walking and begin a more formal exercise program.  The patient should continue antiplatelet therapy and aggressive treatment of the lipid abnormalities  The patient should continue wearing graduated compression socks 10-15 mmHg strength to control any mild edema.     Current Outpatient Medications on File Prior to Visit  Medication Sig Dispense Refill  . acetaminophen (TYLENOL) 500 MG tablet Take 500 mg by mouth every 6 (six) hours as needed for mild pain.    . Alfalfa 500 MG TABS Take 500 mg by mouth daily.     . Ascorbic Acid (VITAMIN C CR) 500 MG CPCR Take 500 mg by mouth daily.    [name] aspirin EC 81 MG EC tablet Take 1 tablet (81 mg total) by mouth every evening. 120 tablet 0  . atorvastatin (LIPITOR) 10 MG tablet Take 1 tablet (10 mg total) by mouth every evening. 30 tablet 6  . Calcium Carbonate-Vitamin D 600-200 MG-UNIT CAPS Take by mouth daily.     . Cholecalciferol (VITAMIN D3) 3000 UNITS TABS Take 3,000 Units by mouth 2 (two) times daily.     [name] CINNAMON PO Take 1,000 mg by mouth daily.     . clotrimazole-betamethasone (LOTRISONE) cream Apply topically 2 (two) times daily. (Patient taking differently: Apply topically 2 (two) times daily as needed. ) 30 g 1  . co-enzyme Q-10 30 MG capsule Take 30 mg by mouth daily.     . diclofenac sodium (VOLTAREN) 1 % GEL Apply topically as needed.     . fluticasone (FLONASE) 50 MCG/ACT nasal spray Place 1 spray into both  nostrils daily.    . Ginkgo Biloba (GNP GINGKO BILOBA EXTRACT PO) Take 1 tablet by mouth daily.    . Glucosamine Sulfate 500 MG CAPS Take 500 mg by mouth daily.    . isosorbide mononitrate (IMDUR) 30 MG 24 hr tablet Take 0.5 tablets (15 mg total) by mouth daily. 45 tablet 3  . Lactobacillus (PROBIOTIC ACIDOPHILUS PO) Take 1 capsule by mouth daily.     [name] loratadine (CLARITIN) 10 MG tablet Take 10 mg by mouth daily as needed for allergies.    . Magnesium 400 MG CAPS Take 400 mg by mouth daily.    . metoprolol succinate (TOPROL XL) 25 MG 24 hr tablet Take 0.5 tablets (12.5 mg total) by mouth daily. 45 tablet 3  . Multiple Vitamin (MULTIVITAMIN) capsule Take 1 capsule by mouth daily.    . nitroGLYCERIN (NITROSTAT) 0.4 MG SL tablet Place 1 tablet (0.4 mg total) under the tongue every 5 (five) minutes as needed for chest pain. 50 tablet 3  . Omega-3 Fatty Acids (OMEGA 3 PO) Take 520 mg by mouth daily.     . pantoprazole (PROTONIX) 40 MG tablet Take 1 tablet (40 mg total) by mouth  daily. 90 tablet 1  . Pramoxine-HC (HYDROCORTISONE ACE-PRAMOXINE) 2.5-1 % CREA Apply topically as needed.     . Turmeric 450 MG CAPS Take 450 Units by mouth daily.     . vitamin B-12 (CYANOCOBALAMIN) 1000 MCG tablet Take 1,000 mcg by mouth daily.     No current facility-administered medications on file prior to visit.    There are no Patient Instructions on file for this visit. No follow-ups on file.   Kris Hartmann, NP

## 2020-05-22 ENCOUNTER — Telehealth: Payer: Self-pay | Admitting: Internal Medicine

## 2020-05-22 ENCOUNTER — Encounter: Payer: Self-pay | Admitting: Emergency Medicine

## 2020-05-22 ENCOUNTER — Emergency Department
Admission: EM | Admit: 2020-05-22 | Discharge: 2020-05-22 | Disposition: A | Discharge status: Home / Self Care | Payer: Medicare Other | Attending: Emergency Medicine | Admitting: Emergency Medicine

## 2020-05-22 ENCOUNTER — Other Ambulatory Visit: Payer: Self-pay

## 2020-05-22 DIAGNOSIS — I2510 Atherosclerotic heart disease of native coronary artery without angina pectoris: Secondary | ICD-10-CM | POA: Diagnosis not present

## 2020-05-22 DIAGNOSIS — N183 Chronic kidney disease, stage 3 unspecified: Secondary | ICD-10-CM | POA: Diagnosis not present

## 2020-05-22 DIAGNOSIS — Z79899 Other long term (current) drug therapy: Secondary | ICD-10-CM | POA: Diagnosis not present

## 2020-05-22 DIAGNOSIS — M542 Cervicalgia: Secondary | ICD-10-CM

## 2020-05-22 DIAGNOSIS — I129 Hypertensive chronic kidney disease with stage 1 through stage 4 chronic kidney disease, or unspecified chronic kidney disease: Secondary | ICD-10-CM | POA: Insufficient documentation

## 2020-05-22 DIAGNOSIS — Z7982 Long term (current) use of aspirin: Secondary | ICD-10-CM | POA: Insufficient documentation

## 2020-05-22 DIAGNOSIS — I10 Essential (primary) hypertension: Secondary | ICD-10-CM

## 2020-05-22 LAB — COMPREHENSIVE METABOLIC PANEL
ALT: 22 U/L (ref 0–44)
AST: 42 U/L — ABNORMAL HIGH (ref 15–41)
Albumin: 4.2 g/dL (ref 3.5–5.0)
Alkaline Phosphatase: 53 U/L (ref 38–126)
Anion gap: 9 (ref 5–15)
BUN: 15 mg/dL (ref 8–23)
CO2: 28 mmol/L (ref 22–32)
Calcium: 8.9 mg/dL (ref 8.9–10.3)
Chloride: 103 mmol/L (ref 98–111)
Creatinine, Ser: 0.68 mg/dL (ref 0.44–1.00)
GFR calc Af Amer: >60 mL/min (ref >60)
GFR calc non Af Amer: >60 mL/min (ref >60)
Glucose, Bld: 105 mg/dL — ABNORMAL HIGH (ref 70–99)
Potassium: 4.1 mmol/L (ref 3.5–5.1)
Sodium: 140 mmol/L (ref 135–145)
Total Bilirubin: 0.6 mg/dL (ref 0.3–1.2)
Total Protein: 7.4 g/dL (ref 6.5–8.1)

## 2020-05-22 LAB — CBC
HCT: 40 % (ref 36.0–46.0)
Hemoglobin: 13.1 g/dL (ref 12.0–15.0)
MCH: 29 pg (ref 26.0–34.0)
MCHC: 32.8 g/dL (ref 30.0–36.0)
MCV: 88.5 fL (ref 80.0–100.0)
Platelets: 212 10*3/uL (ref 150–400)
RBC: 4.52 MIL/uL (ref 3.87–5.11)
RDW: 13 % (ref 11.5–15.5)
WBC: 5.9 10*3/uL (ref 4.0–10.5)
nRBC: 0 % (ref 0.0–0.2)

## 2020-05-22 LAB — TROPONIN I (HIGH SENSITIVITY)
Troponin I (High Sensitivity): 16 ng/L (ref <18)
Troponin I (High Sensitivity): 19 ng/L — ABNORMAL HIGH (ref <18)

## 2020-05-22 NOTE — ED Notes (Signed)
See triage note, pt reports neck pain and right shoulder pain that started at 230 this afternoon along with elevated BP. Reports taking BP medications as prescribed. Reports taking tylenol with no relief. Pt in NAD at this time. Alert and oriented.

## 2020-05-22 NOTE — Discharge Instructions (Addendum)
Please seek medical attention for any high fevers, chest pain, shortness of breath, change in behavior, persistent vomiting, bloody stool or any other new or concerning symptoms.  

## 2020-05-22 NOTE — Telephone Encounter (Signed)
Pt called her BP is 212/105

## 2020-05-22 NOTE — ED Notes (Signed)
Signature pad not working, pt verbalizes understanding of d/c instructions and when to return to ED. Denies questions or concerns at this time

## 2020-05-22 NOTE — Telephone Encounter (Signed)
Patient stated that her BP was 212/105 and she was having shoulder pain that is radiating down arm and back. I instructed patient to go to ED due to sx. Reluctantly she stated she would comply and go.

## 2020-05-22 NOTE — ED Triage Notes (Signed)
Pt here with c/o neck and right shoulder pain that began this am, states her bp has been high all morning as well and was told by her primary dr to be seen in the ED. Pt states neck/shoulder pain is a little better, has full right arm ROM. BP 144/88 in triage. NAD.

## 2020-05-22 NOTE — ED Provider Notes (Signed)
[facility] Emergency Department Provider Note    ____________________________________________   I have reviewed the triage vital signs and the nursing notes.   HISTORY  Chief Complaint High blood pressure   History limited by: Not Limited   HPI [name] is a 86 y.o. female who presents to the emergency department today because of concerns for high blood pressure.  The patient states that this morning she started developing neck pain.  It did radiate to her right shoulder.  She tried putting some arthritis cream on it.  It continued to give her pain so she then took her blood pressure.  She noticed it was elevated.  She repeated taking her blood pressure multiple times and highest blood pressure reading was 212/105.  She states that the time my exam she is feeling somewhat better.  She says that her normal blood pressure readings are systolic 732K.   Records reviewed. Per medical record review patient has a history of CKD, HLD. Arthritis.   Past Medical History:  Diagnosis Date  . Allergy   . Arthritis   . Atherosclerosis   . Atherosclerosis of abdominal aorta ([place])   . Chronic kidney disease    stage 2  . CKD (chronic kidney disease) stage 2, GFR 60-89 ml/min   . Collagen vascular disease ([place])   . Diverticulosis 2008  . GERD (gastroesophageal reflux disease)   . Heart murmur   . Hyperlipidemia   . Lumbar spinal stenosis   . Osteoporosis   . Ovarian lump   . RA (rheumatoid arthritis) ([place])   . Raynaud's disease   . Rheumatoid arthritis ([place])   . Spinal stenosis     Patient Active Problem List   Diagnosis Date Noted  . Hospital discharge follow-up 04/05/2020  . Angina of effort ([place]) 03/26/2020  . Chest pain 03/25/2020  . Accelerated hypertension 03/25/2020  . Atypical chest pain 03/13/2020  . Elevated AST (SGOT) 10/11/2019  . Osteoporosis, postmenopausal 08/07/2019  . Chronic pain in right shoulder 12/06/2018  . Primary  osteoarthritis of both hands 06/05/2018  . Pancreatic cyst 04/10/2018  . Myalgia due to statin 04/10/2018  . Raynaud's phenomenon without gangrene 04/04/2018  . Elevated rheumatoid factor 04/04/2018  . Positive ANA (antinuclear antibody) 04/04/2018  . Weakness 03/14/2018  . Varicose veins of leg with pain, right 09/13/2017  . History of lumbar laminectomy for spinal cord decompression 07/19/2017  . DNAR (do not attempt resuscitation) 04/19/2017  . Compression fracture of L4 lumbar vertebra 12/10/2016  . 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 Abdominal pain 04/09/2016  . Preventative health care 01/03/2016  . Hyperglycemia 12/19/2015  . Compression fracture of thoracic spine, non-traumatic ([place]) 08/19/2015  . Chronic thoracic spine pain 08/15/2015  . Low back pain 08/15/2015  . Compression fracture of L2 lumbar vertebra ([place]) 08/15/2015  . Hyperlipidemia   . Osteoporosis   . Rheumatoid arthritis ([place])   . Atherosclerosis of abdominal aorta ([place])   . Lumbar spinal stenosis   . Chronic kidney disease, stage III (moderate)   . CAD (coronary artery disease) 06/24/2014  . History of adenomatous polyp of colon 06/14/2014  . Amaurosis fugax of left eye 01/29/2013  . Pulmonary nodule seen on imaging study 07/13/2012  . Irritable bowel syndrome 02/16/2012    Past Surgical History:  Procedure Laterality Date  . BREAST SURGERY Left 1965   Benign biopsy  . CHOLECYSTECTOMY  2005   [place]  . COLONOSCOPY  2008   Dr. Donnella Sham  .  EXCISION OF BREAST BIOPSY Left 1965   benign  . EYE SURGERY Bilateral    Cataract Extraction with IOL  . KYPHOPLASTY N/A 12/17/2016   Procedure: KYPHOPLASTY;  Surgeon: Hessie Knows, MD;  Location: [facility] ORS;  Service: Orthopedics;  Laterality: N/A;  l4  . LUMBAR LAMINECTOMY  05/09/2017   L2-L5  . VISCERAL ANGIOGRAPHY N/A 08/15/2017   Procedure: VISCERAL ANGIOGRAPHY;  Surgeon: Algernon Huxley, MD;  Location: [place] [facility];  Service: Cardiovascular;   Laterality: N/A;  . VISCERAL ANGIOGRAPHY N/A 10/05/2017   Procedure: VISCERAL ANGIOGRAPHY;  Surgeon: Algernon Huxley, MD;  Location: [place] [facility];  Service: Cardiovascular;  Laterality: N/A;  . VISCERAL ARTERY INTERVENTION N/A 08/15/2017   Procedure: VISCERAL ARTERY INTERVENTION;  Surgeon: Algernon Huxley, MD;  Location: [place] [facility];  Service: Cardiovascular;  Laterality: N/A;    Prior to Admission medications   Medication Sig Start Date End Date Taking? Authorizing Provider  acetaminophen (TYLENOL) 500 MG tablet Take 500 mg by mouth every 6 (six) hours as needed for mild pain.    [provider]  Alfalfa 500 MG TABS Take 500 mg by mouth daily.     [provider]  Ascorbic Acid (VITAMIN C CR) 500 MG CPCR Take 500 mg by mouth daily.    [provider]  aspirin EC 81 MG EC tablet Take 1 tablet (81 mg total) by mouth every evening. 03/27/20   Lavina Hamman, MD  atorvastatin (LIPITOR) 10 MG tablet Take 1 tablet (10 mg total) by mouth every evening. 12/21/19   Crecencio Mc, MD  Calcium Carbonate-Vitamin D 600-200 MG-UNIT CAPS Take by mouth daily.     [provider]  Cholecalciferol (VITAMIN D3) 3000 UNITS TABS Take 3,000 Units by mouth 2 (two) times daily.     [provider]  CINNAMON PO Take 1,000 mg by mouth daily.     [provider]  clotrimazole-betamethasone (LOTRISONE) cream Apply topically 2 (two) times daily. Patient taking differently: Apply topically 2 (two) times daily as needed.  03/14/19   Poulose, Bethel Born, NP  co-enzyme Q-10 30 MG capsule Take 30 mg by mouth daily.     [provider]  diclofenac sodium (VOLTAREN) 1 % GEL Apply topically as needed.  08/02/19   [provider]  fluticasone (FLONASE) 50 MCG/ACT nasal spray Place 1 spray into both nostrils daily.    [provider]  Ginkgo Biloba (GNP GINGKO BILOBA EXTRACT PO) Take 1 tablet by mouth daily.    [provider]   Glucosamine Sulfate 500 MG CAPS Take 500 mg by mouth daily.    [provider]  isosorbide mononitrate (IMDUR) 30 MG 24 hr tablet Take 0.5 tablets (15 mg total) by mouth daily. 04/25/20   Kate Sable, MD  Lactobacillus (PROBIOTIC ACIDOPHILUS PO) Take 1 capsule by mouth daily.     [provider]  loratadine (CLARITIN) 10 MG tablet Take 10 mg by mouth daily as needed for allergies.    [provider]  Magnesium 400 MG CAPS Take 400 mg by mouth daily.    [provider]  metoprolol succinate (TOPROL XL) 25 MG 24 hr tablet Take 0.5 tablets (12.5 mg total) by mouth daily. 04/25/20 04/25/21  Kate Sable, MD  Multiple Vitamin (MULTIVITAMIN) capsule Take 1 capsule by mouth daily.    [provider]  nitroGLYCERIN (NITROSTAT) 0.4 MG SL tablet Place 1 tablet (0.4 mg total) under the tongue every 5 (five)  minutes as needed for chest pain. 04/22/20   Crecencio Mc, MD  Omega-3 Fatty Acids (OMEGA 3 PO) Take 520 mg by mouth daily.     [provider]  pantoprazole (PROTONIX) 40 MG tablet Take 1 tablet (40 mg total) by mouth daily. 10/11/19   Crecencio Mc, MD  Pramoxine-HC (HYDROCORTISONE ACE-PRAMOXINE) 2.5-1 % CREA Apply topically as needed.     [provider]  Turmeric 450 MG CAPS Take 450 Units by mouth daily.     [provider]  vitamin B-12 (CYANOCOBALAMIN) 1000 MCG tablet Take 1,000 mcg by mouth daily.    [provider]    Allergies Ibandronic acid, Other, Prednisone, Actonel [risedronate], Amoxicillin, Raloxifene, Versed [midazolam], and Doxycycline  Family History  Problem Relation Age of Onset  . Heart disease Mother   . Hypertension Father   . Heart disease Brother 12       heart attack  . Cancer Brother        bladder  . Diabetes Sister   . Alcohol abuse Sister   . Kidney disease Sister   . Diabetes Brother   . Stroke Brother   . Diabetes Maternal Grandmother   . Heart disease Maternal  Grandmother   . Cancer Brother        lung    Social History Social History   Tobacco Use  . Smoking status: Never Smoker  . Smokeless tobacco: Never Used  . Tobacco comment: smoking cessation materials not required  Vaping Use  . Vaping Use: Never used  Substance Use Topics  . Alcohol use: No    Alcohol/week: 0.0 standard drinks  . Drug use: No    Review of Systems Constitutional: No fever/chills Eyes: No visual changes. ENT: No sore throat. Cardiovascular: Denies chest pain. Respiratory: Denies shortness of breath. Gastrointestinal: No abdominal pain.  No nausea, no vomiting.  No diarrhea.   Genitourinary: Negative for dysuria. Musculoskeletal: Positive for neck pain and right shoulder pain. Skin: Negative for rash. Neurological: Negative for headaches, focal weakness or numbness.  ____________________________________________   PHYSICAL EXAM:  VITAL SIGNS: ED Triage Vitals  Enc Vitals Group     BP 05/22/20 1511 (!) 144/88     Pulse Rate 05/22/20 1511 (!) 58     Resp 05/22/20 1511 18     Temp 05/22/20 1511 97.9 F (36.6 C)     Temp Source 05/22/20 1511 Oral     SpO2 05/22/20 1511 98 %     Weight --      Height --      Head Circumference --      Peak Flow --      Pain Score 05/22/20 1531 2   Constitutional: Alert and oriented.  Eyes: Conjunctivae are normal.  ENT      Head: Normocephalic and atraumatic.      Nose: No congestion/rhinnorhea.      Mouth/Throat: Mucous membranes are moist.      Neck: No stridor. No midline tenderness.  Hematological/Lymphatic/Immunilogical: No cervical lymphadenopathy. Cardiovascular: Normal rate, regular rhythm.  No murmurs, rubs, or gallops.  Respiratory: Normal respiratory effort without tachypnea nor retractions. Breath sounds are clear and equal bilaterally. No wheezes/rales/rhonchi. Gastrointestinal: Soft and non tender. No rebound. No guarding.  Genitourinary: Deferred Musculoskeletal: Normal range of motion in all  extremities. No lower extremity edema. Neurologic:  Normal speech and language. No gross focal neurologic deficits are appreciated.  Skin:  Skin is warm, dry and intact. No rash noted. Psychiatric: Mood  and affect are normal. Speech and behavior are normal. Patient exhibits appropriate insight and judgment.  ____________________________________________    LABS (pertinent positives/negatives)  CBC wbc 5.9, hgb 13.1, plt 212 CMP wnl except glu 105, ast 42 Trop 16 to 19 ____________________________________________   EKG  None  ____________________________________________    RADIOLOGY  None  ____________________________________________   PROCEDURES  Procedures  ____________________________________________   INITIAL IMPRESSION / ASSESSMENT AND PLAN / ED COURSE  Pertinent labs & imaging results that were available during my care of the patient were reviewed by me and considered in my medical decision making (see chart for details).   Patient presented to the emergency department today because of concerns for high blood pressure as well as some neck and right shoulder pain.  By the time my exam her symptoms have improved.  CBC and CMP without concerning findings.  Diagnosed patient she did complain of some slight discomfort in her epigastric/lower chest.  Because of this I did order a troponin.  Due to emergency traffic nursing staff had a slight delay withdrawing the troponin.  Talk to the patient she did not want to wait in the emergency department for the results of that troponin.  Given that her blood pressure is not quite as severe as it had been and that she did feel better I think is reasonable for patient be discharged home.  Will follow up on the troponin.   Troponin went 16 to 19. This appears to be consistent with other recent troponins. ____________________________________________   FINAL CLINICAL IMPRESSION(S) / ED DIAGNOSES  Final diagnoses:  Neck pain   Hypertension, unspecified type     Note: This dictation was prepared with Dragon dictation. Any transcriptional errors that result from this process are unintentional     Nance Pear, MD 05/22/20 2121

## 2020-05-23 ENCOUNTER — Ambulatory Visit (INDEPENDENT_AMBULATORY_CARE_PROVIDER_SITE_OTHER): Payer: Medicare Other

## 2020-05-23 ENCOUNTER — Encounter (INDEPENDENT_AMBULATORY_CARE_PROVIDER_SITE_OTHER): Payer: Self-pay | Admitting: Nurse Practitioner

## 2020-05-23 ENCOUNTER — Other Ambulatory Visit: Payer: Self-pay

## 2020-05-23 ENCOUNTER — Ambulatory Visit (INDEPENDENT_AMBULATORY_CARE_PROVIDER_SITE_OTHER): Payer: Medicare Other | Admitting: Nurse Practitioner

## 2020-05-23 VITALS — BP 134/67 | HR 56 | Resp 16 | Wt 100.6 lb

## 2020-05-23 DIAGNOSIS — R1013 Epigastric pain: Secondary | ICD-10-CM

## 2020-05-23 DIAGNOSIS — I774 Celiac artery compression syndrome: Secondary | ICD-10-CM

## 2020-05-23 DIAGNOSIS — E782 Mixed hyperlipidemia: Secondary | ICD-10-CM

## 2020-05-23 DIAGNOSIS — I771 Stricture of artery: Secondary | ICD-10-CM

## 2020-05-23 DIAGNOSIS — I208 Other forms of angina pectoris: Secondary | ICD-10-CM

## 2020-05-23 NOTE — Progress Notes (Signed)
Subjective:    Patient ID: [name], female    DOB: 04-01-1927, 86 y.o.   MRN: [id number] Chief Complaint  Patient presents with  . Follow-up    ultrasound follow up    Patient presents today for follow-up of her celiac artery stenosis.  The patient denies any food phobia, pain after eating, nausea vomiting or unintentional weight loss.  The patient does note however that she has been having some pain in her epigastric region.  The patient notes that the pain is mostly when she lies down at night.  She also endorses that sometimes she has a discomfort feeling in her esophagus as well.  The patient denies that these pains have been consistently after eating although sometimes she has some fullness after eating.  But with movement it goes away.  She denies any fever, chills, nausea, vomiting or diarrhea.  The patient also incidentally notes that she had a recent episode of severe hypertension which was treated in the emergency room.  Today the patient has a widely patent celiac artery stent with moderately increased velocities that are consistent with a 70 to 99% stenosis.  There is no significant stenosis in the SMA or IMA.  Compared to the previous exam on 06/15/2019 the velocities are slightly decreased in the celiac artery.   Review of Systems  Gastrointestinal: Positive for abdominal pain.  All other systems reviewed and are negative.      Objective:   Physical Exam Vitals reviewed.  HENT:     Head: Normocephalic.  Cardiovascular:     Rate and Rhythm: Normal rate and regular rhythm.     Pulses: Normal pulses.     Heart sounds: Normal heart sounds.  Pulmonary:     Effort: Pulmonary effort is normal.     Breath sounds: Normal breath sounds.  Abdominal:     General: Abdomen is flat. Bowel sounds are normal.  Musculoskeletal:        General: Normal range of motion.  Skin:    General: Skin is warm and dry.  Neurological:     Mental Status: She is alert and oriented  to person, place, and time.  Psychiatric:        Mood and Affect: Mood normal.        Behavior: Behavior normal.        Thought Content: Thought content normal.        Judgment: Judgment normal.     BP 134/67 (BP Location: Right Arm)   Pulse (!) 56   Resp 16   Wt 100 lb 9.6 oz (45.6 kg)   BMI 19.65 kg/m   Past Medical History:  Diagnosis Date  . Allergy   . Arthritis   . Atherosclerosis   . Atherosclerosis of abdominal aorta ([place])   . Chronic kidney disease    stage 2  . CKD (chronic kidney disease) stage 2, GFR 60-89 ml/min   . Collagen vascular disease ([place])   . Diverticulosis 2008  . GERD (gastroesophageal reflux disease)   . Heart murmur   . Hyperlipidemia   . Lumbar spinal stenosis   . Osteoporosis   . Ovarian lump   . RA (rheumatoid arthritis) ([place])   . Raynaud's disease   . Rheumatoid arthritis ([place])   . Spinal stenosis     Social History   Socioeconomic History  . Marital status: Widowed    Spouse name: Gwyndolyn Saxon  . Number of children: 3  . Years of education: some college  .  Highest education level: 12th grade  Occupational History    Employer: RETIRED  Tobacco Use  . Smoking status: Never Smoker  . Smokeless tobacco: Never Used  . Tobacco comment: smoking cessation materials not required  Vaping Use  . Vaping Use: Never used  Substance and Sexual Activity  . Alcohol use: No    Alcohol/week: 0.0 standard drinks  . Drug use: No  . Sexual activity: Not Currently  Other Topics Concern  . Not on file  Social History Narrative  . Not on file   Social Determinants of Health   Financial Resource Strain:   . Difficulty of Paying Living Expenses:   Food Insecurity:   . Worried About Charity fundraiser in the Last Year:   . Arboriculturist in the Last Year:   Transportation Needs:   . Film/video editor (Medical):   [name] Lack of Transportation (Non-Medical):   Physical Activity:   . Days of Exercise per Week:   . Minutes of Exercise per  Session:   Stress:   . Feeling of Stress :   Social Connections:   . Frequency of Communication with Friends and Family:   . Frequency of Social Gatherings with Friends and Family:   . Attends Religious Services:   . Active Member of Clubs or Organizations:   . Attends Archivist Meetings:   [name] Marital Status:   Intimate Partner Violence:   . Fear of Current or Ex-Partner:   . Emotionally Abused:   [name] Physically Abused:   . Sexually Abused:     Past Surgical History:  Procedure Laterality Date  . BREAST SURGERY Left 1965   Benign biopsy  . CHOLECYSTECTOMY  2005   [place]  . COLONOSCOPY  2008   Dr. Donnella Sham  . EXCISION OF BREAST BIOPSY Left 1965   benign  . EYE SURGERY Bilateral    Cataract Extraction with IOL  . KYPHOPLASTY N/A 12/17/2016   Procedure: KYPHOPLASTY;  Surgeon: Hessie Knows, MD;  Location: [facility] ORS;  Service: Orthopedics;  Laterality: N/A;  l4  . LUMBAR LAMINECTOMY  05/09/2017   L2-L5  . VISCERAL ANGIOGRAPHY N/A 08/15/2017   Procedure: VISCERAL ANGIOGRAPHY;  Surgeon: Algernon Huxley, MD;  Location: [place] [facility];  Service: Cardiovascular;  Laterality: N/A;  . VISCERAL ANGIOGRAPHY N/A 10/05/2017   Procedure: VISCERAL ANGIOGRAPHY;  Surgeon: Algernon Huxley, MD;  Location: [place] [facility];  Service: Cardiovascular;  Laterality: N/A;  . VISCERAL ARTERY INTERVENTION N/A 08/15/2017   Procedure: VISCERAL ARTERY INTERVENTION;  Surgeon: Algernon Huxley, MD;  Location: [place] [facility];  Service: Cardiovascular;  Laterality: N/A;    Family History  Problem Relation Age of Onset  . Heart disease Mother   . Hypertension Father   . Heart disease Brother 82       heart attack  . Cancer Brother        bladder  . Diabetes Sister   . Alcohol abuse Sister   . Kidney disease Sister   . Diabetes Brother   . Stroke Brother   . Diabetes Maternal Grandmother   . Heart disease Maternal Grandmother   . Cancer Brother        lung    Allergies  Allergen  Reactions  . Ibandronic Acid Other (See Comments)    Achy all over. Flu like S/S  . Other Other (See Comments)    Achy all over. Flu like S/S Dysphagia  . Prednisone Rash  .  Actonel [Risedronate] Other (See Comments)    Dysphagia  . Amoxicillin Hives  . Raloxifene Other (See Comments)    Mood swings  . Versed [Midazolam] Other (See Comments)    Difficult waking up and memory loss  . Doxycycline Rash       Assessment & Plan:   1. Epigastric pain Based on the patient's description of pain in addition to the timing consistency, the culprit has a low probability of being related to her celiac artery stenosis.  The patient also has a known small hiatal hernia.  The patient also notes having some acid reflux previously.  Based on these other possibilities, we will send the patient to GI for further work-up to determine any possible causes of her epigastric pain. - Ambulatory referral to Gastroenterology  2. Mixed hyperlipidemia Continue statin as ordered and reviewed, no changes at this time   3. Celiac artery stenosis (HCC) Today the patient's velocities are actually decreased from her previous study a year ago.  Based on the patient's description of pain and discomfort, it is likely not related to her celiac artery stenosis.  After discussion with the patient and daughter, we will proceed with the GI work-up and in the event that it is unrevealing, we will will revisit the possibility of invasive measures.  Otherwise, we will have the patient follow-up in 1 year with mesenteric studies.   Current Outpatient Medications on File Prior to Visit  Medication Sig Dispense Refill  . acetaminophen (TYLENOL) 500 MG tablet Take 500 mg by mouth every 6 (six) hours as needed for mild pain.    . Alfalfa 500 MG TABS Take 500 mg by mouth daily.     . Ascorbic Acid (VITAMIN C CR) 500 MG CPCR Take 500 mg by mouth daily.    [name] aspirin EC 81 MG EC tablet Take 1 tablet (81 mg total) by mouth every  evening. 120 tablet 0  . atorvastatin (LIPITOR) 10 MG tablet Take 1 tablet (10 mg total) by mouth every evening. 30 tablet 6  . Calcium Carbonate-Vitamin D 600-200 MG-UNIT CAPS Take by mouth daily.     . Cholecalciferol (VITAMIN D3) 3000 UNITS TABS Take 3,000 Units by mouth 2 (two) times daily.     [name] CINNAMON PO Take 1,000 mg by mouth daily.     . clotrimazole-betamethasone (LOTRISONE) cream Apply topically 2 (two) times daily. (Patient taking differently: Apply topically 2 (two) times daily as needed. ) 30 g 1  . co-enzyme Q-10 30 MG capsule Take 30 mg by mouth daily.     . diclofenac sodium (VOLTAREN) 1 % GEL Apply topically as needed.     . fluticasone (FLONASE) 50 MCG/ACT nasal spray Place 1 spray into both nostrils daily.    . Ginkgo Biloba (GNP GINGKO BILOBA EXTRACT PO) Take 1 tablet by mouth daily.    . Glucosamine Sulfate 500 MG CAPS Take 500 mg by mouth daily.    . isosorbide mononitrate (IMDUR) 30 MG 24 hr tablet Take 0.5 tablets (15 mg total) by mouth daily. 45 tablet 3  . Lactobacillus (PROBIOTIC ACIDOPHILUS PO) Take 1 capsule by mouth daily.     [name] loratadine (CLARITIN) 10 MG tablet Take 10 mg by mouth daily as needed for allergies.    . Magnesium 400 MG CAPS Take 400 mg by mouth daily.    . metoprolol succinate (TOPROL XL) 25 MG 24 hr tablet Take 0.5 tablets (12.5 mg total) by mouth daily. 45 tablet 3  . Multiple  Vitamin (MULTIVITAMIN) capsule Take 1 capsule by mouth daily.    . nitroGLYCERIN (NITROSTAT) 0.4 MG SL tablet Place 1 tablet (0.4 mg total) under the tongue every 5 (five) minutes as needed for chest pain. 50 tablet 3  . Omega-3 Fatty Acids (OMEGA 3 PO) Take 520 mg by mouth daily.     . pantoprazole (PROTONIX) 40 MG tablet Take 1 tablet (40 mg total) by mouth daily. 90 tablet 1  . Pramoxine-HC (HYDROCORTISONE ACE-PRAMOXINE) 2.5-1 % CREA Apply topically as needed.     . Turmeric 450 MG CAPS Take 450 Units by mouth daily.     . vitamin B-12 (CYANOCOBALAMIN) 1000 MCG tablet  Take 1,000 mcg by mouth daily.     No current facility-administered medications on file prior to visit.    There are no Patient Instructions on file for this visit. No follow-ups on file.   Kris Hartmann, NP

## 2020-05-25 ENCOUNTER — Other Ambulatory Visit: Payer: Self-pay | Admitting: Internal Medicine

## 2020-05-28 ENCOUNTER — Other Ambulatory Visit: Payer: Self-pay

## 2020-05-28 ENCOUNTER — Ambulatory Visit (INDEPENDENT_AMBULATORY_CARE_PROVIDER_SITE_OTHER): Payer: Medicare Other | Admitting: Internal Medicine

## 2020-05-28 ENCOUNTER — Encounter: Payer: Self-pay | Admitting: Internal Medicine

## 2020-05-28 VITALS — BP 160/82 | HR 61 | Temp 97.7°F | Ht 60.0 in | Wt 101.0 lb

## 2020-05-28 DIAGNOSIS — S51002S Unspecified open wound of left elbow, sequela: Secondary | ICD-10-CM | POA: Insufficient documentation

## 2020-05-28 DIAGNOSIS — R1010 Upper abdominal pain, unspecified: Secondary | ICD-10-CM

## 2020-05-28 DIAGNOSIS — I25118 Atherosclerotic heart disease of native coronary artery with other forms of angina pectoris: Secondary | ICD-10-CM | POA: Diagnosis not present

## 2020-05-28 DIAGNOSIS — R7401 Elevation of levels of liver transaminase levels: Secondary | ICD-10-CM | POA: Diagnosis not present

## 2020-05-28 DIAGNOSIS — I10 Essential (primary) hypertension: Secondary | ICD-10-CM

## 2020-05-28 DIAGNOSIS — I208 Other forms of angina pectoris: Secondary | ICD-10-CM | POA: Diagnosis not present

## 2020-05-28 LAB — URINALYSIS, ROUTINE W REFLEX MICROSCOPIC
Bilirubin Urine: NEGATIVE
Hgb urine dipstick: NEGATIVE
Ketones, ur: NEGATIVE
Leukocytes,Ua: NEGATIVE
Nitrite: NEGATIVE
RBC / HPF: NONE SEEN (ref 0–?)
Specific Gravity, Urine: 1.015 (ref 1.000–1.030)
Total Protein, Urine: NEGATIVE
Urine Glucose: NEGATIVE
Urobilinogen, UA: 0.2 (ref 0.0–1.0)
WBC, UA: NONE SEEN (ref 0–?)
pH: 6.5 (ref 5.0–8.0)

## 2020-05-28 LAB — COMPREHENSIVE METABOLIC PANEL
ALT: 19 U/L (ref 0–35)
AST: 39 U/L — ABNORMAL HIGH (ref 0–37)
Albumin: 4.3 g/dL (ref 3.5–5.2)
Alkaline Phosphatase: 58 U/L (ref 39–117)
BUN: 12 mg/dL (ref 6–23)
CO2: 30 mEq/L (ref 19–32)
Calcium: 9.3 mg/dL (ref 8.4–10.5)
Chloride: 102 mEq/L (ref 96–112)
Creatinine, Ser: 0.68 mg/dL (ref 0.40–1.20)
GFR: 81.18 mL/min (ref >60.00)
Glucose, Bld: 90 mg/dL (ref 70–99)
Potassium: 4.1 mEq/L (ref 3.5–5.1)
Sodium: 138 mEq/L (ref 135–145)
Total Bilirubin: 0.5 mg/dL (ref 0.2–1.2)
Total Protein: 7.2 g/dL (ref 6.0–8.3)

## 2020-05-28 NOTE — Assessment & Plan Note (Signed)
Home readings remain elevated on metoprolol and Imdur.  Will add either 2.5 mg amlodipine or 25 mg losartan pending review of urinalysis today

## 2020-05-28 NOTE — Assessment & Plan Note (Signed)
Advised to keep vaseline on eschar and keep covered

## 2020-05-28 NOTE — Assessment & Plan Note (Signed)
Chronic, present for over 2 years.  Workup has included MR abd in 2019, CT angio May 2021, and vascular follow up for celiac artery stenosis s/p stenting in 2018.  thus far no vascular or GI source has been determined to be the culprit.  Patient's pain is not postprandial, more often related to position (worse when supine) and has persisted despite daily adherence to PPI therapy.  GI referral has been made by vascular.  She reports that the pain is moving from the epigastric region to the umbilical region and is not severe or relieved by defecating. My suspicion is that her DDD and prior T11-L1 compression fractures may be causing referred pain due spinal stenosis and  foraminal stenosis , which I have suggested in the past.  Will refer to Neurosurgery once GI has seen her if alternative cause has not been found.

## 2020-05-28 NOTE — Progress Notes (Signed)
Subjective:  Patient ID: [name], female    DOB: 04-Aug-1927  Age: 86 y.o. MRN: [id number]  CC: The primary encounter diagnosis was Elevated alanine aminotransferase (ALT) level. Diagnoses of Elevated blood pressure reading in office with diagnosis of hypertension, Pain of upper abdomen, Accelerated hypertension, Coronary artery disease of native heart with stable angina pectoris, unspecified vessel or lesion type ([place]), and Elbow wound, left, sequela were also pertinent to this visit.  HPI [name] presents for ER follow up and follow up on chronic abdominal pain of uncertain etiology.  ER records reviewed:  Taken to ER for hypertensive crisis accompanied by posterior neck pain radiating to right shoulder.  BP had normalized from 834 systolic at home by time of ER evaluation.  Troponin x 2,  Second one slightly high..  AST was slightly elevated , but this is chronic and relatively lower than on the past 5 occasions. 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  The celiac stenosis was not considered by Vascular to be the cause of her chronic epigastric pain which is NOT aggravated by eating and is worse with supine position and wakes her up at night.  Vascular has referred her to GI to evaluate for non vascular causes of her epigastric pain. Her last EGD was   She has lumbar spinal stenosis at L2-3 level and chronic T11/12 vertebral compression fractures without loss of height by May 2021 MRI thoracic spine. She has had prior lumbar surgery by Melina Schools at [facility] at the age of 55.   Nonhealing left elbow scab reported by patient . Outpatient Medications Prior to Visit  Medication Sig Dispense Refill  . acetaminophen (TYLENOL) 500 MG tablet Take 500 mg by mouth every 6 (six) hours as needed for mild pain.    . Alfalfa 500 MG TABS Take 500 mg by  mouth daily.     . Ascorbic Acid (VITAMIN C CR) 500 MG CPCR Take 500 mg by mouth daily.    [name] aspirin EC 81 MG EC tablet Take 1 tablet (81 mg total) by mouth every evening. 120 tablet 0  . atorvastatin (LIPITOR) 10 MG tablet Take 1 tablet (10 mg total) by mouth every evening. 30 tablet 6  . Calcium Carbonate-Vitamin D 600-200 MG-UNIT CAPS Take by mouth daily.     . Cholecalciferol (VITAMIN D3) 3000 UNITS TABS Take 3,000 Units by mouth 2 (two) times daily.     [name] CINNAMON PO Take 1,000 mg by mouth daily.     . clotrimazole-betamethasone (LOTRISONE) cream Apply topically 2 (two) times daily. (Patient taking differently: Apply topically 2 (two) times daily as needed. ) 30 g 1  . co-enzyme Q-10 30 MG capsule Take 30 mg by mouth daily.     . diclofenac sodium (VOLTAREN) 1 % GEL Apply topically as needed.     . fluticasone (FLONASE) 50 MCG/ACT nasal spray Place 1 spray into both nostrils daily.    . Ginkgo Biloba (GNP GINGKO BILOBA EXTRACT PO) Take 1 tablet by mouth daily.    . Glucosamine Sulfate 500 MG CAPS Take 500 mg by mouth daily.    . isosorbide mononitrate (IMDUR) 30 MG 24 hr tablet Take 0.5 tablets (15 mg total) by mouth daily. 45 tablet 3  . Lactobacillus (PROBIOTIC ACIDOPHILUS PO) Take 1 capsule by mouth daily.     [name]  loratadine (CLARITIN) 10 MG tablet Take 10 mg by mouth daily as needed for allergies.    . Magnesium 400 MG CAPS Take 400 mg by mouth daily.    . metoprolol succinate (TOPROL XL) 25 MG 24 hr tablet Take 0.5 tablets (12.5 mg total) by mouth daily. 45 tablet 3  . Multiple Vitamin (MULTIVITAMIN) capsule Take 1 capsule by mouth daily.    . nitroGLYCERIN (NITROSTAT) 0.4 MG SL tablet Place 1 tablet (0.4 mg total) under the tongue every 5 (five) minutes as needed for chest pain. 50 tablet 3  . Omega-3 Fatty Acids (OMEGA 3 PO) Take 520 mg by mouth daily.     . pantoprazole (PROTONIX) 40 MG tablet Take 1 tablet by mouth once daily 90 tablet 0  . Pramoxine-HC (HYDROCORTISONE  ACE-PRAMOXINE) 2.5-1 % CREA Apply topically as needed.     . Turmeric 450 MG CAPS Take 450 Units by mouth daily.     . vitamin B-12 (CYANOCOBALAMIN) 1000 MCG tablet Take 1,000 mcg by mouth daily.     No facility-administered medications prior to visit.    Review of Systems;  Patient denies headache, fevers, malaise, unintentional weight loss, skin rash, eye pain, sinus congestion and sinus pain, sore throat, dysphagia,  hemoptysis , cough, dyspnea, wheezing, chest pain, palpitations, orthopnea, edema, abdominal pain, nausea, melena, diarrhea, constipation, flank pain, dysuria, hematuria, urinary  Frequency, nocturia, numbness, tingling, seizures,  Focal weakness, Loss of consciousness,  Tremor, insomnia, depression, anxiety, and suicidal ideation.      Objective:  BP (!) 160/82 (BP Location: Left Arm, Patient Position: Sitting, Cuff Size: Normal)   Pulse 61   Temp 97.7 F (36.5 C) (Oral)   Ht 5' (1.524 m)   Wt 101 lb (45.8 kg)   SpO2 98%   BMI 19.73 kg/m   BP Readings from Last 3 Encounters:  05/28/20 (!) 160/82  05/23/20 134/67  05/22/20 (!) 185/83    Wt Readings from Last 3 Encounters:  05/28/20 101 lb (45.8 kg)  05/23/20 100 lb 9.6 oz (45.6 kg)  05/21/20 102 lb (46.3 kg)    General appearance: alert, cooperative and appears stated age Ears: normal TM's and external ear canals both ears Throat: lips, mucosa, and tongue normal; teeth and gums normal Neck: no adenopathy, no carotid bruit, supple, symmetrical, trachea midline and thyroid not enlarged, symmetric, no tenderness/mass/nodules Back: symmetric, no curvature. ROM normal. No CVA tenderness. Lungs: clear to auscultation bilaterally Heart: regular rate and rhythm, S1, S2 normal, no murmur, click, rub or gallop Abdomen: soft, non-tender; bowel sounds normal; no masses,  no organomegaly Pulses: 2+ and symmetric Ext:  Left elbow with small effusion, erythema without warmth noted..  Small central plug  eschar  Skin:  Skin color, texture, turgor normal. No rashes or lesions Lymph nodes: Cervical, supraclavicular, and axillary nodes normal.  Lab Results  Component Value Date   HGBA1C 6.1 (H) 05/14/2019   HGBA1C 6.2 (H) 01/01/2019   HGBA1C 6.1 (H) 03/14/2018    Lab Results  Component Value Date   CREATININE 0.68 05/22/2020   CREATININE 0.59 03/27/2020   CREATININE 0.46 03/26/2020    Lab Results  Component Value Date   WBC 5.9 05/22/2020   HGB 13.1 05/22/2020   HCT 40.0 05/22/2020   PLT 212 05/22/2020   GLUCOSE 105 (H) 05/22/2020   CHOL 137 04/03/2020   TRIG 95.0 04/03/2020   HDL 62.80 04/03/2020   LDLCALC 55 04/03/2020   ALT 22 05/22/2020   AST 42 (H) 05/22/2020  NA 140 05/22/2020   K 4.1 05/22/2020   CL 103 05/22/2020   CREATININE 0.68 05/22/2020   BUN 15 05/22/2020   CO2 28 05/22/2020   TSH 2.22 03/12/2020   HGBA1C 6.1 (H) 05/14/2019    No results found.  Assessment & Plan:   Problem List Items Addressed This Visit      Unprioritized   Abdominal pain    Chronic, present for over 2 years.  Workup has included MR abd in 2019, CT angio May 2021, and vascular follow up for celiac artery stenosis s/p stenting in 2018.  thus far no vascular or GI source has been determined to be the culprit.  Patient's pain is not postprandial, more often related to position (worse when supine) and has persisted despite daily adherence to PPI therapy.  GI referral has been made by vascular.  She reports that the pain is moving from the epigastric region to the umbilical region and is not severe or relieved by defecating. My suspicion is that her DDD and prior T11-L1 compression fractures may be causing referred pain due spinal stenosis and  foraminal stenosis , which I have suggested in the past.  Will refer to Neurosurgery once GI has seen her if alternative cause has not been found.       Accelerated hypertension    Home readings remain elevated on metoprolol and Imdur.  Will add either 2.5 mg  amlodipine or 25 mg losartan pending review of urinalysis today       CAD (coronary artery disease)    Stress test/myoview was done by Cardiology after recent hospitalization for chest pain with mildly elevated troponins and no EKG changes ("NSTEMI" mention in Dr Alveria Apley office note June 2021).  Low risk stress test results.  Continue medical management.       Elbow wound, left, sequela    Advised to keep vaseline on eschar and keep covered       Other Visit Diagnoses    Elevated alanine aminotransferase (ALT) level    -  Primary   Relevant Orders   Comp Met (CMET)   Elevated blood pressure reading in office with diagnosis of hypertension       Relevant Orders   Urinalysis, Routine w reflex microscopic      I am having [name] maintain her Vitamin D3, multivitamin, Omega-3 Fatty Acids (OMEGA 3 PO), Vitamin C CR, Lactobacillus (PROBIOTIC ACIDOPHILUS PO), Glucosamine Sulfate, vitamin B-12, CINNAMON PO, Turmeric, co-enzyme Q-10, acetaminophen, Magnesium, loratadine, Ginkgo Biloba (GNP GINGKO BILOBA EXTRACT PO), Calcium Carbonate-Vitamin D, Hydrocortisone Ace-Pramoxine, fluticasone, clotrimazole-betamethasone, diclofenac sodium, atorvastatin, Alfalfa, aspirin, nitroGLYCERIN, metoprolol succinate, isosorbide mononitrate, and pantoprazole.  No orders of the defined types were placed in this encounter.   There are no discontinued medications.  Follow-up: No follow-ups on file.   Crecencio Mc, MD

## 2020-05-28 NOTE — Assessment & Plan Note (Signed)
Stress test/myoview was done by Cardiology after recent hospitalization for chest pain with mildly elevated troponins and no EKG changes ("NSTEMI" mention in Dr Alveria Apley office note June 2021).  Low risk stress test results.  Continue medical management.

## 2020-05-30 ENCOUNTER — Telehealth: Payer: Self-pay

## 2020-05-30 NOTE — Telephone Encounter (Signed)
LMTCB in regards to lab results.  

## 2020-06-17 ENCOUNTER — Encounter (INDEPENDENT_AMBULATORY_CARE_PROVIDER_SITE_OTHER): Payer: Medicare Other

## 2020-06-17 ENCOUNTER — Other Ambulatory Visit: Payer: Self-pay | Admitting: Physician Assistant

## 2020-06-17 ENCOUNTER — Ambulatory Visit (INDEPENDENT_AMBULATORY_CARE_PROVIDER_SITE_OTHER): Payer: Medicare Other | Admitting: Vascular Surgery

## 2020-06-17 DIAGNOSIS — H9201 Otalgia, right ear: Secondary | ICD-10-CM

## 2020-06-27 ENCOUNTER — Ambulatory Visit
Admission: RE | Admit: 2020-06-27 | Discharge: 2020-06-27 | Disposition: A | Discharge status: Home / Self Care | Payer: Medicare Other | Source: Ambulatory Visit | Attending: Physician Assistant | Admitting: Physician Assistant

## 2020-06-27 ENCOUNTER — Other Ambulatory Visit: Payer: Self-pay

## 2020-06-27 DIAGNOSIS — H9201 Otalgia, right ear: Secondary | ICD-10-CM | POA: Diagnosis not present

## 2020-06-27 MED ORDER — IOHEXOL 300 MG/ML  SOLN
75.0000 mL | Freq: Once | INTRAMUSCULAR | Status: AC | PRN
  Administered 2020-06-27: 75 mL via INTRAVENOUS

## 2020-07-10 ENCOUNTER — Other Ambulatory Visit: Payer: Self-pay

## 2020-07-10 ENCOUNTER — Ambulatory Visit (INDEPENDENT_AMBULATORY_CARE_PROVIDER_SITE_OTHER): Payer: Medicare Other | Admitting: Gastroenterology

## 2020-07-10 ENCOUNTER — Encounter: Payer: Self-pay | Admitting: Gastroenterology

## 2020-07-10 VITALS — BP 145/66 | HR 67 | Temp 98.0°F | Ht 60.0 in | Wt 101.4 lb

## 2020-07-10 DIAGNOSIS — R748 Abnormal levels of other serum enzymes: Secondary | ICD-10-CM | POA: Diagnosis not present

## 2020-07-10 DIAGNOSIS — R1084 Generalized abdominal pain: Secondary | ICD-10-CM | POA: Diagnosis not present

## 2020-07-10 MED ORDER — FAMOTIDINE 20 MG PO TABS: 20.0000 mg | ORAL_TABLET | Freq: Two times a day (BID) | ORAL | 1 refills | Status: DC

## 2020-07-10 NOTE — Patient Instructions (Signed)
Please start taking Pepcid 20 MG two times a day.

## 2020-07-11 LAB — ALPHA-1-ANTITRYPSIN: A-1 Antitrypsin: 134 mg/dL (ref 101–187)

## 2020-07-11 LAB — H. PYLORI BREATH TEST: H pylori Breath Test: POSITIVE — AB

## 2020-07-11 LAB — HEPATITIS A ANTIBODY, TOTAL: hep A Total Ab: POSITIVE — AB

## 2020-07-11 LAB — ANTI-MICROSOMAL ANTIBODY LIVER / KIDNEY: LKM1 Ab: 5.4 Units (ref 0.0–20.0)

## 2020-07-11 LAB — IGG: IgG (Immunoglobin G), Serum: 1019 mg/dL (ref 586–1602)

## 2020-07-11 LAB — CERULOPLASMIN: Ceruloplasmin: 23.5 mg/dL (ref 19.0–39.0)

## 2020-07-11 NOTE — Progress Notes (Signed)
[name] [address]  [place]  [place], [place] [address]  Main: [phone]  Fax: [phone]   Gastroenterology Consultation  Referring Provider:     Kris Hartmann, NP Primary Care Physician:  Crecencio Mc, MD Reason for Consultation:   Abdominal pain          HPI:    Chief Complaint  Patient presents with  . Abdominal Pain    [name] is a 86 y.o. y/o female referred for consultation & management  by Dr. Crecencio Mc, MD.  Patient reports a few month history of epigastric abdominal pain.,  Dull, nonradiating, 5/10, does not vary with meals.  No nausea or vomiting.  No dysphagia.  No weight loss.  No altered bowel habits.  Reports previous colonoscopy years ago and states it was normal.  No prior upper endoscopy.  Past Medical History:  Diagnosis Date  . Allergy   . Arthritis   . Atherosclerosis   . Atherosclerosis of abdominal aorta ([place])   . Chronic kidney disease    stage 2  . CKD (chronic kidney disease) stage 2, GFR 60-89 ml/min   . Collagen vascular disease ([place])   . Diverticulosis 2008  . GERD (gastroesophageal reflux disease)   . Heart murmur   . Hyperlipidemia   . Lumbar spinal stenosis   . Osteoporosis   . Ovarian lump   . RA (rheumatoid arthritis) ([place])   . Raynaud's disease   . Rheumatoid arthritis ([place])   . Spinal stenosis     Past Surgical History:  Procedure Laterality Date  . BREAST SURGERY Left 1965   Benign biopsy  . CHOLECYSTECTOMY  2005   [facility]  . COLONOSCOPY  2008   Dr. Donnella Sham  . EXCISION OF BREAST BIOPSY Left 1965   benign  . EYE SURGERY Bilateral    Cataract Extraction with IOL  . KYPHOPLASTY N/A 12/17/2016   Procedure: KYPHOPLASTY;  Surgeon: Hessie Knows, MD;  Location: [facility] ORS;  Service: Orthopedics;  Laterality: N/A;  l4  . LUMBAR LAMINECTOMY  05/09/2017   L2-L5  . VISCERAL ANGIOGRAPHY N/A 08/15/2017   Procedure: VISCERAL ANGIOGRAPHY;  Surgeon: Algernon Huxley, MD;  Location: [facility];  Service: Cardiovascular;  Laterality: N/A;  . VISCERAL ANGIOGRAPHY N/A 10/05/2017   Procedure: VISCERAL ANGIOGRAPHY;  Surgeon: Algernon Huxley, MD;  Location: [facility];  Service: Cardiovascular;  Laterality: N/A;  . VISCERAL ARTERY INTERVENTION N/A 08/15/2017   Procedure: VISCERAL ARTERY INTERVENTION;  Surgeon: Algernon Huxley, MD;  Location: [name];  Service: Cardiovascular;  Laterality: N/A;    Prior to Admission medications   Medication Sig Start Date End Date Taking? Authorizing Provider  acetaminophen (TYLENOL) 500 MG tablet Take 500 mg by mouth every 6 (six) hours as needed for mild pain.   Yes [provider]  Alfalfa 500 MG TABS Take 500 mg by mouth daily.    Yes [provider]  Ascorbic Acid (VITAMIN C CR) 500 MG CPCR Take 500 mg by mouth daily.   Yes [provider]  aspirin EC 81 MG EC tablet Take 1 tablet (81 mg total) by mouth every evening. 03/27/20  Yes Lavina Hamman, MD  atorvastatin (LIPITOR) 10 MG tablet Take 1 tablet (10 mg total) by mouth every evening. 12/21/19  Yes Crecencio Mc, MD  Calcium Carbonate-Vitamin D 600-200 MG-UNIT CAPS Take by mouth daily.    Yes [provider]  Cholecalciferol (  VITAMIN D3) 3000 UNITS TABS Take 3,000 Units by mouth 2 (two) times daily.    Yes [provider]  CINNAMON PO Take 1,000 mg by mouth daily.    Yes [provider]  clotrimazole-betamethasone (LOTRISONE) cream Apply topically 2 (two) times daily. Patient taking differently: Apply topically 2 (two) times daily as needed.  03/14/19  Yes Poulose, Bethel Born, NP  diclofenac sodium (VOLTAREN) 1 % GEL Apply topically as needed.  08/02/19  Yes [provider]  fluticasone (FLONASE) 50 MCG/ACT nasal spray Place 1 spray into both nostrils daily.   Yes [provider]  Ginkgo Biloba (GNP GINGKO BILOBA EXTRACT PO) Take 1 tablet by mouth daily.   Yes [provider]  Glucosamine  Sulfate 500 MG CAPS Take 500 mg by mouth daily.   Yes [provider]  isosorbide mononitrate (IMDUR) 30 MG 24 hr tablet Take 0.5 tablets (15 mg total) by mouth daily. 04/25/20  Yes Agbor-Etang, Aaron Edelman, MD  Lactobacillus (PROBIOTIC ACIDOPHILUS PO) Take 1 capsule by mouth daily.    Yes [provider]  loratadine (CLARITIN) 10 MG tablet Take 10 mg by mouth daily as needed for allergies.   Yes [provider]  Magnesium 400 MG CAPS Take 400 mg by mouth daily.   Yes [provider]  metoprolol succinate (TOPROL XL) 25 MG 24 hr tablet Take 0.5 tablets (12.5 mg total) by mouth daily. 04/25/20 04/25/21 Yes Agbor-Etang, Aaron Edelman, MD  Multiple Vitamin (MULTIVITAMIN) capsule Take 1 capsule by mouth daily.   Yes [provider]  nitroGLYCERIN (NITROSTAT) 0.4 MG SL tablet Place 1 tablet (0.4 mg total) under the tongue every 5 (five) minutes as needed for chest pain. 04/22/20  Yes Crecencio Mc, MD  Omega-3 Fatty Acids (OMEGA 3 PO) Take 520 mg by mouth daily.    Yes [provider]  Pramoxine-HC (HYDROCORTISONE ACE-PRAMOXINE) 2.5-1 % CREA Apply topically as needed.    Yes [provider]  Turmeric 450 MG CAPS Take 450 Units by mouth daily.    Yes [provider]  vitamin B-12 (CYANOCOBALAMIN) 1000 MCG tablet Take 1,000 mcg by mouth daily.   Yes [provider]  famotidine (PEPCID) 20 MG tablet Take 1 tablet (20 mg total) by mouth 2 (two) times daily. 07/10/20   Virgel Manifold, MD    Family History  Problem Relation Age of Onset  . Heart disease Mother   . Hypertension Father   . Heart disease Brother 43       heart attack  . Cancer Brother        bladder  . Diabetes Sister   . Alcohol abuse Sister   . Kidney disease Sister   . Diabetes Brother   . Stroke Brother   . Diabetes Maternal Grandmother   . Heart disease Maternal Grandmother   . Cancer Brother        lung     Social History   Tobacco Use  . Smoking  status: Never Smoker  . Smokeless tobacco: Never Used  . Tobacco comment: smoking cessation materials not required  Vaping Use  . Vaping Use: Never used  Substance Use Topics  . Alcohol use: No    Alcohol/week: 0.0 standard drinks  . Drug use: No    Allergies as of 07/10/2020 - Review Complete 07/10/2020  Allergen Reaction Noted  . Ibandronic acid Other (See Comments) 08/14/2015  . Other Other (See Comments) 01/19/2012  . Prednisone Rash 12/26/2014  . Actonel [risedronate] Other (  See Comments) 06/19/2014  . Amoxicillin Hives 04/15/2020  . Raloxifene Other (See Comments) 08/14/2015  . Versed [midazolam] Other (See Comments) 12/16/2016  . Doxycycline Rash 12/26/2014    Review of Systems:    All systems reviewed and negative except where noted in HPI.   Physical Exam:  BP (!) 145/66   Pulse 67   Temp 98 F (36.7 C) (Oral)   Ht 5' (1.524 m)   Wt 101 lb 6.4 oz (46 kg)   BMI 19.80 kg/m  No LMP recorded. Patient is postmenopausal. Psych:  Alert and cooperative. Normal mood and affect. General:   Alert,  Well-developed, well-nourished, pleasant and cooperative in NAD Head:  Normocephalic and atraumatic. Eyes:  Sclera clear, no icterus.   Conjunctiva pink. Ears:  Normal auditory acuity. Nose:  No deformity, discharge, or lesions. Mouth:  No deformity or lesions,oropharynx pink & moist. Neck:  Supple; no masses or thyromegaly. Abdomen:  Normal bowel sounds.  No bruits.  Soft, non-tender and non-distended without masses, hepatosplenomegaly or hernias noted.  No guarding or rebound tenderness.    Msk:  Symmetrical without gross deformities. Good, equal movement & strength bilaterally. Pulses:  Normal pulses noted. Extremities:  No clubbing or edema.  No cyanosis. Neurologic:  Alert and oriented x3;  grossly normal neurologically. Skin:  Intact without significant lesions or rashes. No jaundice. Lymph Nodes:  No significant cervical adenopathy. Psych:  Alert and cooperative.  Normal mood and affect.   Labs: CBC    Component Value Date/Time   WBC 5.9 05/22/2020 1536   RBC 4.52 05/22/2020 1536   HGB 13.1 05/22/2020 1536   HGB 14.1 12/19/2015 1422   HCT 40.0 05/22/2020 1536   HCT 43.0 12/19/2015 1422   PLT 212 05/22/2020 1536   PLT 217 12/19/2015 1422   MCV 88.5 05/22/2020 1536   MCV 90 12/19/2015 1422   MCH 29.0 05/22/2020 1536   MCHC 32.8 05/22/2020 1536   RDW 13.0 05/22/2020 1536   RDW 14.2 12/19/2015 1422   LYMPHSABS 1.5 03/12/2020 1521   LYMPHSABS 1.4 12/19/2015 1422   MONOABS 0.5 03/12/2020 1521   EOSABS 0.1 03/12/2020 1521   EOSABS 0.0 12/19/2015 1422   BASOSABS 0.0 03/12/2020 1521   BASOSABS 0.0 12/19/2015 1422   CMP     Component Value Date/Time   NA 138 05/28/2020 1237   NA 143 12/19/2015 1422   K 4.1 05/28/2020 1237   CL 102 05/28/2020 1237   CO2 30 05/28/2020 1237   GLUCOSE 90 05/28/2020 1237   BUN 12 05/28/2020 1237   BUN 15 12/19/2015 1422   CREATININE 0.68 05/28/2020 1237   CREATININE 0.62 01/26/2019 1542   CALCIUM 9.3 05/28/2020 1237   PROT 7.2 05/28/2020 1237   PROT 6.2 12/19/2015 1422   ALBUMIN 4.3 05/28/2020 1237   ALBUMIN 4.0 12/19/2015 1422   AST 39 (H) 05/28/2020 1237   ALT 19 05/28/2020 1237   ALKPHOS 58 05/28/2020 1237   BILITOT 0.5 05/28/2020 1237   BILITOT <0.2 12/19/2015 1422   GFRNONAA >60 05/22/2020 1536   GFRNONAA 80 01/26/2019 1542   GFRAA >60 05/22/2020 1536   GFRAA 93 01/26/2019 1542    Imaging Studies: CT SOFT TISSUE NECK W CONTRAST  Result Date: 06/27/2020 CLINICAL DATA:  Right ear pain 1 week. EXAM: CT NECK WITH CONTRAST TECHNIQUE: Multidetector CT imaging of the neck was performed using the standard protocol following the bolus administration of intravenous contrast. CONTRAST:  60mL OMNIPAQUE IOHEXOL 300 MG/ML  SOLN COMPARISON:  None.  FINDINGS: Pharynx and larynx: Normal. No mass or swelling. Salivary glands: No inflammation, mass, or stone. Thyroid: Negative Lymph nodes: No enlarged lymph  nodes in the neck. Vascular: Atherosclerotic aortic arch without aneurysm. Atherosclerotic disease in the proximal left subclavian with mild stenosis. Atherosclerotic calcification carotid bifurcation. Moderate stenosis proximal left internal carotid artery and mild stenosis proximal right internal carotid artery Limited intracranial: Negative Visualized orbits: Bilateral cataract extraction.  No orbital mass. Mastoids and visualized paranasal sinuses: Paranasal sinuses clear bilaterally Mastoid sinus clear bilaterally without effusion or mass. Middle ear clear bilaterally. Skeleton: Degenerative changes in the cervical spine without acute skeletal abnormality. Upper chest: Lung apices clear bilaterally. Other: None IMPRESSION: 1. Mastoid sinus clear bilaterally without effusion or mass. 2. Negative for pharyngeal mass. 3. No adenopathy in the neck. 4. Atherosclerotic disease in the aortic arch and carotid arteries. Moderate stenosis proximal left internal carotid artery and mild stenosis proximal right internal carotid artery. * Electronically Signed   By: Franchot Gallo M.D.   On: 06/27/2020 15:19    Assessment and Plan:   [name] is a 86 y.o. y/o female has been referred for abdominal pain  Obtain H. pylori breath test Start H2 RA If symptoms no better, patient advised to let us know  Patient was found to have mildly elevated AST in the past.  Work-up for this has already been done by PCP in January of this year.  Will obtain other labs that were not done at that time to evaluate for etiology      Dr [name]  Speech recognition software was used to dictate the above note.

## 2020-07-15 MED ORDER — OMEPRAZOLE 20 MG PO CPDR
20.0000 mg | DELAYED_RELEASE_CAPSULE | Freq: Two times a day (BID) | ORAL | 0 refills | Status: DC
Start: 2020-07-15 — End: 2020-09-16

## 2020-07-15 MED ORDER — CLARITHROMYCIN 500 MG PO TABS
500.0000 mg | ORAL_TABLET | Freq: Two times a day (BID) | ORAL | 0 refills | Status: DC
Start: 2020-07-15 — End: 2020-07-17

## 2020-07-15 MED ORDER — METRONIDAZOLE 500 MG PO TABS
500.0000 mg | ORAL_TABLET | Freq: Three times a day (TID) | ORAL | 0 refills | Status: DC
Start: 2020-07-15 — End: 2020-07-17

## 2020-07-15 NOTE — Addendum Note (Signed)
Addended by: [name] on: 07/15/2020 01:26 PM   Modules accepted: Orders

## 2020-07-16 ENCOUNTER — Telehealth: Payer: Self-pay

## 2020-07-16 NOTE — Telephone Encounter (Signed)
-----   Message from Virgel Manifold, MD sent at 07/15/2020  1:27 PM EDT ----- Herb Grays please let the patient know, her testing showed that she is positive for H. pylori.  I have sent 2 antibiotics and omeprazole over to her pharmacy.  She will take these for 14 days.  Please instruct her to hold her atorvastatin for the 14 days that she is on this treatment, and restart it after the treatment is completed.  Please document that you explained this to her.  Eradication testing can be done on her next visit.

## 2020-07-16 NOTE — Telephone Encounter (Signed)
Called patient's daughters Eustaquio Maize and Manuela Schwartz) and they both did not answer. I will send a MyChart message letting them know what is happening with their mom.

## 2020-07-17 ENCOUNTER — Ambulatory Visit
Admission: EM | Admit: 2020-07-17 | Discharge: 2020-07-17 | Disposition: A | Discharge status: Home / Self Care | Payer: Medicare Other | Attending: Family Medicine | Admitting: Family Medicine

## 2020-07-17 ENCOUNTER — Other Ambulatory Visit: Payer: Self-pay

## 2020-07-17 DIAGNOSIS — L0390 Cellulitis, unspecified: Secondary | ICD-10-CM

## 2020-07-17 DIAGNOSIS — M79604 Pain in right leg: Secondary | ICD-10-CM

## 2020-07-17 MED ORDER — SULFAMETHOXAZOLE-TRIMETHOPRIM 800-160 MG PO TABS: 1.0000 | ORAL_TABLET | Freq: Two times a day (BID) | ORAL | 0 refills | Status: AC

## 2020-07-17 NOTE — ED Provider Notes (Signed)
[place]   [id number] 07/17/20 Arrival Time: 2355  CC: RASH  SUBJECTIVE:  [name] is a 86 y.o. female who presents with a skin complaint that began about 4 weeks ago.  Reports that she was outside working in the yard, and thinks that she got cut on her leg from the brush.  Reports that the area just will not heal.  She has tried over-the-counter antibiotic creams.  She reports that the area is red, warm, tender to touch.  She reports that the pain woke her up overnight last night.  She reports that the affected area is medial aspect of the right lower leg. There are no aggravating or alleviating factors. Denies similar symptoms in the past that.  Denies fever, chills, nausea, vomiting, discharge, oral lesions, SOB, chest pain, abdominal pain, changes in bowel or bladder function.    ROS: As per HPI.  All other pertinent ROS negative.     Past Medical History:  Diagnosis Date  . Allergy   . Arthritis   . Atherosclerosis   . Atherosclerosis of abdominal aorta ([place])   . Chronic kidney disease    stage 2  . CKD (chronic kidney disease) stage 2, GFR 60-89 ml/min   . Collagen vascular disease ([place])   . Diverticulosis 2008  . GERD (gastroesophageal reflux disease)   . Heart murmur   . Hyperlipidemia   . Lumbar spinal stenosis   . Osteoporosis   . Ovarian lump   . RA (rheumatoid arthritis) ([place])   . Raynaud's disease   . Rheumatoid arthritis ([place])   . Spinal stenosis    Past Surgical History:  Procedure Laterality Date  . BREAST SURGERY Left 1965   Benign biopsy  . CHOLECYSTECTOMY  2005   [place]  . COLONOSCOPY  2008   Dr. Donnella Sham  . EXCISION OF BREAST BIOPSY Left 1965   benign  . EYE SURGERY Bilateral    Cataract Extraction with IOL  . KYPHOPLASTY N/A 12/17/2016   Procedure: KYPHOPLASTY;  Surgeon: Hessie Knows, MD;  Location: [facility] OR[facility];  Service: Orthopedics;  Laterality: N/A;  l4  . LUMBAR LAMINECTOMY  05/09/2017   L2-L5  . VISCERAL ANGIOGRAPHY  N/A 08/15/2017   Procedure: VISCERAL ANGIOGRAPHY;  Surgeon: Algernon Huxley, MD;  Location: [place] [facility];  Service: Cardiovascular;  Laterality: N/A;  . VISCERAL ANGIOGRAPHY N/A 10/05/2017   Procedure: VISCERAL ANGIOGRAPHY;  Surgeon: Algernon Huxley, MD;  Location: [place] [facility];  Service: Cardiovascular;  Laterality: N/A;  . VISCERAL ARTERY INTERVENTION N/A 08/15/2017   Procedure: VISCERAL ARTERY INTERVENTION;  Surgeon: Algernon Huxley, MD;  Location: [place] [facility];  Service: Cardiovascular;  Laterality: N/A;   Allergies  Allergen Reactions  . Ibandronic Acid Other (See Comments)    Achy all over. Flu like S/S  . Other Other (See Comments)    Achy all over. Flu like S/S Dysphagia  . Prednisone Rash  . Actonel [Risedronate] Other (See Comments)    Dysphagia  . Amoxicillin Hives  . Raloxifene Other (See Comments)    Mood swings  . Versed [Midazolam] Other (See Comments)    Difficult waking up and memory loss  . Doxycycline Rash   No current facility-administered medications on file prior to encounter.   Current Outpatient Medications on File Prior to Encounter  Medication Sig Dispense Refill  . acetaminophen (TYLENOL) 500 MG tablet Take 500 mg by mouth every 6 (six) hours as needed for mild pain.    [name]  Alfalfa 500 MG TABS Take 500 mg by mouth daily.     . Ascorbic Acid (VITAMIN C CR) 500 MG CPCR Take 500 mg by mouth daily.    [name] aspirin EC 81 MG EC tablet Take 1 tablet (81 mg total) by mouth every evening. 120 tablet 0  . atorvastatin (LIPITOR) 10 MG tablet Take 1 tablet (10 mg total) by mouth every evening. 30 tablet 6  . Calcium Carbonate-Vitamin D 600-200 MG-UNIT CAPS Take by mouth daily.     . Cholecalciferol (VITAMIN D3) 3000 UNITS TABS Take 3,000 Units by mouth 2 (two) times daily.     [name] CINNAMON PO Take 1,000 mg by mouth daily.     . clotrimazole-betamethasone (LOTRISONE) cream Apply topically 2 (two) times daily. (Patient taking differently: Apply topically 2 (two)  times daily as needed. ) 30 g 1  . diclofenac sodium (VOLTAREN) 1 % GEL Apply topically as needed.     . famotidine (PEPCID) 20 MG tablet Take 1 tablet (20 mg total) by mouth 2 (two) times daily. 60 tablet 1  . fluticasone (FLONASE) 50 MCG/ACT nasal spray Place 1 spray into both nostrils daily.    . Ginkgo Biloba (GNP GINGKO BILOBA EXTRACT PO) Take 1 tablet by mouth daily.    . Glucosamine Sulfate 500 MG CAPS Take 500 mg by mouth daily.    . isosorbide mononitrate (IMDUR) 30 MG 24 hr tablet Take 0.5 tablets (15 mg total) by mouth daily. 45 tablet 3  . Lactobacillus (PROBIOTIC ACIDOPHILUS PO) Take 1 capsule by mouth daily.     [name] loratadine (CLARITIN) 10 MG tablet Take 10 mg by mouth daily as needed for allergies.    . Magnesium 400 MG CAPS Take 400 mg by mouth daily.    . metoprolol succinate (TOPROL XL) 25 MG 24 hr tablet Take 0.5 tablets (12.5 mg total) by mouth daily. 45 tablet 3  . Multiple Vitamin (MULTIVITAMIN) capsule Take 1 capsule by mouth daily.    . nitroGLYCERIN (NITROSTAT) 0.4 MG SL tablet Place 1 tablet (0.4 mg total) under the tongue every 5 (five) minutes as needed for chest pain. 50 tablet 3  . Omega-3 Fatty Acids (OMEGA 3 PO) Take 520 mg by mouth daily.     [name] omeprazole (PRILOSEC) 20 MG capsule Take 1 capsule (20 mg total) by mouth in the morning and at bedtime for 14 days. 28 capsule 0  . Pramoxine-HC (HYDROCORTISONE ACE-PRAMOXINE) 2.5-1 % CREA Apply topically as needed.     . Turmeric 450 MG CAPS Take 450 Units by mouth daily.     . vitamin B-12 (CYANOCOBALAMIN) 1000 MCG tablet Take 1,000 mcg by mouth daily.     Social History   Socioeconomic History  . Marital status: Widowed    Spouse name: Gwyndolyn Saxon  . Number of children: 3  . Years of education: some college  . Highest education level: 12th grade  Occupational History    Employer: RETIRED  Tobacco Use  . Smoking status: Never Smoker  . Smokeless tobacco: Never Used  . Tobacco comment: smoking cessation materials  not required  Vaping Use  . Vaping Use: Never used  Substance and Sexual Activity  . Alcohol use: No    Alcohol/week: 0.0 standard drinks  . Drug use: No  . Sexual activity: Not Currently  Other Topics Concern  . Not on file  Social History Narrative  . Not on file   Social Determinants of Health   Financial Resource Strain:   .  Difficulty of Paying Living Expenses: Not on file  Food Insecurity:   . Worried About Charity fundraiser in the Last Year: Not on file  . Ran Out of Food in the Last Year: Not on file  Transportation Needs:   . Lack of Transportation (Medical): Not on file  . Lack of Transportation (Non-Medical): Not on file  Physical Activity:   . Days of Exercise per Week: Not on file  . Minutes of Exercise per Session: Not on file  Stress:   . Feeling of Stress : Not on file  Social Connections:   . Frequency of Communication with Friends and Family: Not on file  . Frequency of Social Gatherings with Friends and Family: Not on file  . Attends Religious Services: Not on file  . Active Member of Clubs or Organizations: Not on file  . Attends Archivist Meetings: Not on file  . Marital Status: Not on file  Intimate Partner Violence:   . Fear of Current or Ex-Partner: Not on file  . Emotionally Abused: Not on file  . Physically Abused: Not on file  . Sexually Abused: Not on file   Family History  Problem Relation Age of Onset  . Heart disease Mother   . Hypertension Father   . Heart disease Brother 65       heart attack  . Cancer Brother        bladder  . Diabetes Sister   . Alcohol abuse Sister   . Kidney disease Sister   . Diabetes Brother   . Stroke Brother   . Diabetes Maternal Grandmother   . Heart disease Maternal Grandmother   . Cancer Brother        lung    OBJECTIVE: There were no vitals filed for this visit.  General appearance: alert; no distress Head: NCAT Lungs: clear to auscultation bilaterally Heart: regular rate and  rhythm.  Radial pulse 2+ bilaterally Extremities: no edema Skin: warm and dry;    Psychological: alert and cooperative; normal mood and affect  ASSESSMENT & PLAN:  1. Right leg pain   2. Cellulitis, unspecified cellulitis site     Meds ordered this encounter  Medications  . sulfamethoxazole-trimethoprim (BACTRIM DS) 800-160 MG tablet    Sig: Take 1 tablet by mouth 2 (two) times daily for 7 days.    Dispense:  14 tablet    Refill:  0    Order Specific Question:   Supervising Provider    Answer:   Chase Picket [id number]    Prescribed Bactrim Take as prescribed and to completion Avoid hot showers/ baths Moisturize skin daily  Follow up with PCP if symptoms persists Return or go to the ER if you have any new or worsening symptoms such as fever, chills, nausea, vomiting, redness, swelling, discharge, if symptoms do not improve with medications  Reviewed expectations re: course of current medical issues. Questions answered. Outlined signs and symptoms indicating need for more acute intervention. Patient verbalized understanding. After Visit Summary given.   Faustino Congress, NP 07/17/20 1614

## 2020-07-17 NOTE — ED Triage Notes (Signed)
Pt triaged by NP.  VS entered.

## 2020-07-17 NOTE — Discharge Instructions (Signed)
I am treating you for cellulitis  I have sent in Bactrim for you to take twice a day for 7 days  If you are not noticing improvement over the next 2 days on the antibiotics, follow-up with this office or with primary care

## 2020-08-25 ENCOUNTER — Other Ambulatory Visit: Payer: Self-pay

## 2020-08-26 ENCOUNTER — Ambulatory Visit (INDEPENDENT_AMBULATORY_CARE_PROVIDER_SITE_OTHER): Payer: Medicare Other | Admitting: Nurse Practitioner

## 2020-08-26 ENCOUNTER — Encounter: Payer: Self-pay | Admitting: Nurse Practitioner

## 2020-08-26 ENCOUNTER — Telehealth: Payer: Self-pay

## 2020-08-26 ENCOUNTER — Ambulatory Visit (INDEPENDENT_AMBULATORY_CARE_PROVIDER_SITE_OTHER): Payer: Medicare Other

## 2020-08-26 VITALS — BP 132/74 | HR 62 | Temp 97.8°F | Ht 60.0 in | Wt 101.0 lb

## 2020-08-26 DIAGNOSIS — Z23 Encounter for immunization: Secondary | ICD-10-CM | POA: Diagnosis not present

## 2020-08-26 DIAGNOSIS — S81831D Puncture wound without foreign body, right lower leg, subsequent encounter: Secondary | ICD-10-CM

## 2020-08-26 DIAGNOSIS — I8393 Asymptomatic varicose veins of bilateral lower extremities: Secondary | ICD-10-CM

## 2020-08-26 DIAGNOSIS — M79604 Pain in right leg: Secondary | ICD-10-CM | POA: Diagnosis not present

## 2020-08-26 DIAGNOSIS — S81831A Puncture wound without foreign body, right lower leg, initial encounter: Secondary | ICD-10-CM | POA: Insufficient documentation

## 2020-08-26 MED ORDER — MUPIROCIN 2 % EX OINT
TOPICAL_OINTMENT | CUTANEOUS | 0 refills | Status: DC
Start: 2020-08-26 — End: 2024-05-22

## 2020-08-26 NOTE — Telephone Encounter (Signed)
Patient aware of her appt date and time with Dr. Lucky Cowboy.

## 2020-08-26 NOTE — Progress Notes (Signed)
Established Patient Office Visit  Subjective:  Patient ID: [name], female    DOB: [birth year]  Age: 86 y.o. MRN: [id number]  CC:  Chief Complaint  Patient presents with  . Acute Visit    wounds on legs    HPI [name] is a 86 yo who presents for a poor healing leg wound obtained 2 months ago while picking up tree limbs in her yard. The limb scratched her leg as she was wearing pedal pusher pants that exposed her lower legs. She has other more minor scrapes on both legs. She has severe varicose veins bilateral and has not seen Dr. Lucky Cowboy in [place]. She walks daily and has no leg pain with walking.   The right lower medial wound is a possible minor puncture site as she thinks a stick may poked it. She does not feel anything inside of it, but wonders if there is something inside of it that is keeping it from closing.  She has been treating it with Neosporin and a Band-Aid. She was seen at the [facility] on 07/17/2020 for leg pain that woke her from sleep, redness and warmth. She was DX w cellulitis and treated with Bactrim DS 800-160 mg tablet twice daily for 7 days.  She tolerated all of the medication well. Currently, it is not as painful and has no drainage, warmth, swelling. Still is mildly tender to touch.  No fever or chills or any constitutional symptoms. Tdap in 2020- UTD.   Past Medical History:  Diagnosis Date  . Allergy   . Arthritis   . Atherosclerosis   . Atherosclerosis of abdominal aorta ([place])   . Chronic kidney disease    stage 2  . CKD (chronic kidney disease) stage 2, GFR 60-89 ml/min   . Collagen vascular disease ([place])   . Diverticulosis 2008  . GERD (gastroesophageal reflux disease)   . Heart murmur   . Hyperlipidemia   . Lumbar spinal stenosis   . Osteoporosis   . Ovarian lump   . RA (rheumatoid arthritis) ([place])   . Raynaud's disease   . Rheumatoid arthritis ([place])   . Spinal stenosis     Past Surgical History:  Procedure  Laterality Date  . BREAST SURGERY Left 1965   Benign biopsy  . CHOLECYSTECTOMY  2005   [place]  . COLONOSCOPY  2008   Dr. Donnella Sham  . EXCISION OF BREAST BIOPSY Left 1965   benign  . EYE SURGERY Bilateral    Cataract Extraction with IOL  . KYPHOPLASTY N/A 12/17/2016   Procedure: KYPHOPLASTY;  Surgeon: Hessie Knows, MD;  Location: [facility];  Service: Orthopedics;  Laterality: N/A;  l4  . LUMBAR LAMINECTOMY  05/09/2017   L2-L5  . VISCERAL ANGIOGRAPHY N/A 08/15/2017   Procedure: VISCERAL ANGIOGRAPHY;  Surgeon: Algernon Huxley, MD;  Location: [place] [facility];  Service: Cardiovascular;  Laterality: N/A;  . VISCERAL ANGIOGRAPHY N/A 10/05/2017   Procedure: VISCERAL ANGIOGRAPHY;  Surgeon: Algernon Huxley, MD;  Location: [place] [facility];  Service: Cardiovascular;  Laterality: N/A;  . VISCERAL ARTERY INTERVENTION N/A 08/15/2017   Procedure: VISCERAL ARTERY INTERVENTION;  Surgeon: Algernon Huxley, MD;  Location: [place] [facility];  Service: Cardiovascular;  Laterality: N/A;    Family History  Problem Relation Age of Onset  . Heart disease Mother   . Hypertension Father   . Heart disease Brother 102       heart attack  . Cancer Brother  bladder  . Diabetes Sister   . Alcohol abuse Sister   . Kidney disease Sister   . Diabetes Brother   . Stroke Brother   . Diabetes Maternal Grandmother   . Heart disease Maternal Grandmother   . Cancer Brother        lung    Social History   Socioeconomic History  . Marital status: Widowed    Spouse name: Gwyndolyn Saxon  . Number of children: 3  . Years of education: some college  . Highest education level: 12th grade  Occupational History    Employer: RETIRED  Tobacco Use  . Smoking status: Never Smoker  . Smokeless tobacco: Never Used  . Tobacco comment: smoking cessation materials not required  Vaping Use  . Vaping Use: Never used  Substance and Sexual Activity  . Alcohol use: No    Alcohol/week: 0.0 standard drinks  . Drug use: No    . Sexual activity: Not Currently  Other Topics Concern  . Not on file  Social History Narrative  . Not on file   Social Determinants of Health   Financial Resource Strain:   . Difficulty of Paying Living Expenses: Not on file  Food Insecurity:   . Worried About Charity fundraiser in the Last Year: Not on file  . Ran Out of Food in the Last Year: Not on file  Transportation Needs:   . Lack of Transportation (Medical): Not on file  . Lack of Transportation (Non-Medical): Not on file  Physical Activity:   . Days of Exercise per Week: Not on file  . Minutes of Exercise per Session: Not on file  Stress:   . Feeling of Stress : Not on file  Social Connections:   . Frequency of Communication with Friends and Family: Not on file  . Frequency of Social Gatherings with Friends and Family: Not on file  . Attends Religious Services: Not on file  . Active Member of Clubs or Organizations: Not on file  . Attends Archivist Meetings: Not on file  . Marital Status: Not on file  Intimate Partner Violence:   . Fear of Current or Ex-Partner: Not on file  . Emotionally Abused: Not on file  . Physically Abused: Not on file  . Sexually Abused: Not on file    Outpatient Medications Prior to Visit  Medication Sig Dispense Refill  . acetaminophen (TYLENOL) 500 MG tablet Take 500 mg by mouth every 6 (six) hours as needed for mild pain.    . Alfalfa 500 MG TABS Take 500 mg by mouth daily.     . Ascorbic Acid (VITAMIN C CR) 500 MG CPCR Take 500 mg by mouth daily.    [name] aspirin EC 81 MG EC tablet Take 1 tablet (81 mg total) by mouth every evening. 120 tablet 0  . atorvastatin (LIPITOR) 10 MG tablet Take 1 tablet (10 mg total) by mouth every evening. 30 tablet 6  . Calcium Carbonate (CALCIUM 600 PO) Take by mouth.    . Cholecalciferol (VITAMIN D3) 3000 UNITS TABS Take 3,000 Units by mouth 2 (two) times daily.     [name] CINNAMON PO Take 1,000 mg by mouth daily.     . clotrimazole-betamethasone  (LOTRISONE) cream Apply topically 2 (two) times daily. (Patient taking differently: Apply topically 2 (two) times daily as needed. ) 30 g 1  . diclofenac sodium (VOLTAREN) 1 % GEL Apply topically as needed.     . famotidine (PEPCID) 20 MG tablet Take  1 tablet (20 mg total) by mouth 2 (two) times daily. 60 tablet 1  . fluticasone (FLONASE) 50 MCG/ACT nasal spray Place 1 spray into both nostrils daily.    . Ginkgo Biloba (GNP GINGKO BILOBA EXTRACT PO) Take 1 tablet by mouth daily.    . Glucosamine Sulfate 500 MG CAPS Take 500 mg by mouth daily.    . isosorbide mononitrate (IMDUR) 30 MG 24 hr tablet Take 0.5 tablets (15 mg total) by mouth daily. 45 tablet 3  . Lactobacillus (PROBIOTIC ACIDOPHILUS PO) Take 1 capsule by mouth daily.     [name] loratadine (CLARITIN) 10 MG tablet Take 10 mg by mouth daily as needed for allergies.    . Magnesium 400 MG CAPS Take 400 mg by mouth daily.    . metoprolol succinate (TOPROL XL) 25 MG 24 hr tablet Take 0.5 tablets (12.5 mg total) by mouth daily. 45 tablet 3  . Multiple Vitamin (MULTIVITAMIN) capsule Take 1 capsule by mouth daily.    . nitroGLYCERIN (NITROSTAT) 0.4 MG SL tablet Place 1 tablet (0.4 mg total) under the tongue every 5 (five) minutes as needed for chest pain. 50 tablet 3  . Omega-3 Fatty Acids (OMEGA 3 PO) Take 520 mg by mouth daily.     . Pramoxine-HC (HYDROCORTISONE ACE-PRAMOXINE) 2.5-1 % CREA Apply topically as needed.     . Turmeric 450 MG CAPS Take 450 Units by mouth daily.     . vitamin B-12 (CYANOCOBALAMIN) 1000 MCG tablet Take 1,000 mcg by mouth daily.    [name] omeprazole (PRILOSEC) 20 MG capsule Take 1 capsule (20 mg total) by mouth in the morning and at bedtime for 14 days. 28 capsule 0  . Calcium Carbonate-Vitamin D 600-200 MG-UNIT CAPS Take by mouth daily.      No facility-administered medications prior to visit.    Allergies  Allergen Reactions  . Ibandronic Acid Other (See Comments)    Achy all over. Flu like S/S  . Other Other (See  Comments)    Achy all over. Flu like S/S Dysphagia  . Prednisone Rash  . Actonel [Risedronate] Other (See Comments)    Dysphagia  . Amoxicillin Hives  . Pantoprazole Other (See Comments)    Little blistery bumps  . Raloxifene Other (See Comments)    Mood swings  . Versed [Midazolam] Other (See Comments)    Difficult waking up and memory loss  . Doxycycline Rash    Review of Systems Pertinent positives noted in history of present illness and otherwise negative.   Objective:    Physical Exam Vitals reviewed.  Constitutional:      Appearance: Normal appearance. She is normal weight.  HENT:     Head: Normocephalic and atraumatic.  Cardiovascular:     Rate and Rhythm: Normal rate and regular rhythm.     Pulses: Normal pulses.     Heart sounds: Normal heart sounds.     Comments: Rt DP pulse 1+, Left DP 2+   Pulmonary:     Effort: Pulmonary effort is normal.     Breath sounds: Normal breath sounds.  Musculoskeletal:        General: Signs of injury present. Normal range of motion.     Cervical back: Normal range of motion and neck supple.     Right lower leg: No edema.     Left lower leg: No edema.     Comments: Varicose veins- bulging bilateral lower extremity.   Skin:    Comments: Fragile skin that tears and  bruises easily in this 86 yo very active person.  Picture #1 is an abrasion on right lateral lower leg. This is fresher in the last few weeks. Dry and healing. No warmth, redness, induration, drainage. Healing scab. Not painful.   Picture #2 is an abrasion right lateral lower leg without warmth, redness, swelling or drainage. Not painful. Healing scab.   Picture #3 is the right medial lower leg non healing wound of her concern and previously seen at Urgent care.The site is granulating. Still redness surrounding but no warmth and no drainage or induration.    Neurological:     General: No focal deficit present.     Mental Status: She is alert and oriented to person,  place, and time.  Psychiatric:        Mood and Affect: Mood normal.        Behavior: Behavior normal.       BP 132/74 (BP Location: Left Arm, Patient Position: Sitting, Cuff Size: Normal)   Pulse 62   Temp 97.8 F (36.6 C) (Oral)   Ht 5' (1.524 m)   Wt 101 lb (45.8 kg)   SpO2 97%   BMI 19.73 kg/m  Wt Readings from Last 3 Encounters:  08/26/20 101 lb (45.8 kg)  07/10/20 101 lb 6.4 oz (46 kg)  05/28/20 101 lb (45.8 kg)   Pulse Readings from Last 3 Encounters:  08/26/20 62  07/17/20 (!) 55  07/10/20 67    BP Readings from Last 3 Encounters:  08/26/20 132/74  07/17/20 (!) 161/70  07/10/20 (!) 145/66    Lab Results  Component Value Date   CHOL 137 04/03/2020   HDL 62.80 04/03/2020   LDLCALC 55 04/03/2020   TRIG 95.0 04/03/2020   CHOLHDL 2 04/03/2020      There are no preventive care reminders to display for this patient.  There are no preventive care reminders to display for this patient.  Lab Results  Component Value Date   TSH 2.22 03/12/2020   Lab Results  Component Value Date   WBC 5.9 05/22/2020   HGB 13.1 05/22/2020   HCT 40.0 05/22/2020   MCV 88.5 05/22/2020   PLT 212 05/22/2020   Lab Results  Component Value Date   NA 138 05/28/2020   K 4.1 05/28/2020   CO2 30 05/28/2020   GLUCOSE 90 05/28/2020   BUN 12 05/28/2020   CREATININE 0.68 05/28/2020   BILITOT 0.5 05/28/2020   ALKPHOS 58 05/28/2020   AST 39 (H) 05/28/2020   ALT 19 05/28/2020   PROT 7.2 05/28/2020   ALBUMIN 4.3 05/28/2020   CALCIUM 9.3 05/28/2020   ANIONGAP 9 05/22/2020   GFR 81.18 05/28/2020   Lab Results  Component Value Date   CHOL 137 04/03/2020   Lab Results  Component Value Date   HDL 62.80 04/03/2020   Lab Results  Component Value Date   LDLCALC 55 04/03/2020   Lab Results  Component Value Date   TRIG 95.0 04/03/2020   Lab Results  Component Value Date   CHOLHDL 2 04/03/2020   Lab Results  Component Value Date   HGBA1C 6.1 (H) 05/14/2019        Assessment & Plan:   Problem List Items Addressed This Visit      Cardiovascular and Mediastinum   Asymptomatic varicose veins of lower extremity, bilateral     Other   Puncture wound of right lower leg   Relevant Orders   AMB referral to wound care center  DG Tibia/Fibula Right (Completed)   Leg pain, right - Primary   Need for immunization against influenza   Relevant Orders   Flu Vaccine QUAD High Dose(Fluad) (Completed)     No drainage, swelling and it is mildly tender. Meds ordered this encounter  Medications  . mupirocin ointment (BACTROBAN) 2 %    Sig: Apply to right leg wound daily - a small dab as needed once a day.    Dispense:  15 g    Refill:  0    Order Specific Question:   Supervising Provider    Answer:   Einar Pheasant [[id number]]    Follow-up: Return in about 3 weeks (around 09/16/2020).   This visit occurred during the SARS-CoV-2 public health emergency.  Safety protocols were in place, including screening questions prior to the visit, additional usage of staff PPE, and extensive cleaning of exam room while observing appropriate contact time as indicated for disinfecting solutions.   Denice Paradise, NP

## 2020-08-26 NOTE — Patient Instructions (Addendum)
No drainage, swelling and it is mildly tender.   Puncture Wound A puncture wound is an injury that is caused by a sharp, thin object that goes through (penetrates) your skin. Usually, a puncture wound does not leave a large opening in your skin, so it may not bleed a lot. However, when you get a puncture wound, dirt or other materials (foreign bodies) can be forced into your wound and can break off inside. This increases the chance of infection, such as tetanus. There are many sharp, pointed objects that can cause puncture wounds, including teeth, nails, splinters of glass, fishhooks, and needles. Treatment may include washing out the wound with a germ-free (sterile) salt-water solution, having the wound opened surgically to remove a foreign object, closing the wound with stitches (sutures), and covering the wound with antibiotic ointment and a bandage (dressing). Depending on what caused the injury, you may also need a tetanus shot or a rabies shot. Follow these instructions at home: Medicines  Take or apply over-the-counter and prescription medicines only as told by your health care provider.  If you were prescribed an antibiotic medicine, take or apply it as told by your health care provider. Do not stop using the antibiotic even if your condition improves. Bathing  Keep the dressing dry as told by your health care provider.  Do not take baths, swim, or use a hot tub until your health care provider approves. Ask your health care provider if you may take showers. You may only be allowed to take sponge baths. Wound care   There are many ways to close and cover a wound. For example, a wound can be closed with sutures, skin glue, or adhesive strips. Follow instructions from your health care provider about how to take care of your wound. Make sure you: ? Wash your hands with soap and water before and after you change your dressing. If soap and water are not available, use hand sanitizer. ? Change  your dressing as told by your health care provider. ? Leave sutures, skin glue, or adhesive strips in place. These skin closures may need to stay in place for 2 weeks or longer. If adhesive strip edges start to loosen and curl up, you may trim the loose edges. Do not remove adhesive strips completely unless your health care provider tells you to do that.  Clean the wound as told by your health care provider.  Do not scratch or pick at the wound.  Check your wound every day for signs of infection. Check for: ? Redness, swelling, or pain. ? Fluid or blood. ? Warmth. ? Pus or a bad smell. General instructions  Raise (elevate) the injured area above the level of your heart while you are sitting or lying down.  If your puncture wound is in your foot, ask your health care provider if you need to avoid putting weight on your foot and for how long. Do not use the injured limb to support your body weight until your health care provider says that you can. Use crutches as told by your health care provider.  Keep all follow-up visits as told by your health care provider. This is important. Contact a health care provider if:  You received a tetanus shot and you have swelling, severe pain, redness, or bleeding at the injection site.  You have a fever.  Your sutures come out.  You notice a bad smell coming from your wound or your dressing.  You notice something coming out of your  wound, such as wood or glass.  Your pain is not controlled with medicine.  You have increased redness, swelling, or pain at the site of your wound.  You have fluid, blood, or pus coming from your wound.  You notice a change in the color of your skin near your wound.  You need to change the dressing frequently due to fluid, blood, or pus draining from your wound.  You develop a new rash.  You develop numbness around your wound.  You have warmth around your wound. Get help right away if:  You develop severe  swelling around your wound.  Your pain suddenly increases and is severe.  You develop painful skin lumps.  You have a red streak going away from your wound.  The wound is on your hand or foot and you: ? Cannot properly move a finger or toe. ? Notice that your fingers or toes look pale or bluish. Summary  A puncture wound is an injury that is caused by a sharp, thin object that goes through (penetrates) your skin.  Treatment may include washing out the wound, having the wound opened surgically to remove a foreign object, closing the wound with stitches (sutures), and covering the wound with antibiotic ointment and a bandage (dressing).  Follow instructions from your health care provider about how to take care of your wound.  Contact a health care provider if you have increased redness, swelling, or pain at the site of your wound.  Keep all follow-up visits as told by your health care provider. This is important. This information is not intended to replace advice given to you by your health care provider. Make sure you discuss any questions you have with your health care provider. Document Revised: 06/01/2018 Document Reviewed: 06/01/2018 Elsevier Patient Education  [place].

## 2020-08-26 NOTE — Telephone Encounter (Signed)
Patient wanted [place] to schedule vascular appt for her with Dr. Lucky Cowboy as she already sees him. I called and was able to get patient scheduled for 09/01/20 at 9:00 am. Left message for patient to call back to notify her of her appt.

## 2020-08-28 ENCOUNTER — Encounter: Payer: Self-pay | Admitting: Nurse Practitioner

## 2020-08-28 DIAGNOSIS — I8393 Asymptomatic varicose veins of bilateral lower extremities: Secondary | ICD-10-CM | POA: Insufficient documentation

## 2020-08-28 DIAGNOSIS — Z23 Encounter for immunization: Secondary | ICD-10-CM | POA: Insufficient documentation

## 2020-08-28 DIAGNOSIS — M79604 Pain in right leg: Secondary | ICD-10-CM | POA: Insufficient documentation

## 2020-09-01 ENCOUNTER — Other Ambulatory Visit: Payer: Self-pay

## 2020-09-01 ENCOUNTER — Ambulatory Visit (INDEPENDENT_AMBULATORY_CARE_PROVIDER_SITE_OTHER): Payer: Medicare Other | Admitting: Nurse Practitioner

## 2020-09-01 ENCOUNTER — Encounter (INDEPENDENT_AMBULATORY_CARE_PROVIDER_SITE_OTHER): Payer: Self-pay | Admitting: Nurse Practitioner

## 2020-09-01 VITALS — BP 167/80 | HR 65 | Resp 16 | Wt 101.4 lb

## 2020-09-01 DIAGNOSIS — S81831D Puncture wound without foreign body, right lower leg, subsequent encounter: Secondary | ICD-10-CM

## 2020-09-01 DIAGNOSIS — I83811 Varicose veins of right lower extremities with pain: Secondary | ICD-10-CM | POA: Diagnosis not present

## 2020-09-03 ENCOUNTER — Encounter (INDEPENDENT_AMBULATORY_CARE_PROVIDER_SITE_OTHER): Payer: Self-pay | Admitting: Nurse Practitioner

## 2020-09-07 NOTE — Progress Notes (Signed)
Subjective:    Patient ID: [name], female    DOB: [birth year], 86 y.o.   MRN: [id number] Chief Complaint  Patient presents with  . Follow-up    ref Jerelene Redden circulation issues    Patient presents today as a referral from her primary care provider with concern for a slow healing wound on her right lower extremity.  The patient notes that she was in the yard doing yard work when a branch fell and hit her leg and it punctured her leg.  This area is located over a large varicosity.  The patient has large varicosities bilaterally.  The patient is concerned that slow wound healing could be related to a possible retained splinter.  However the patient does not have any signs and symptoms of worsening infection or of foreign body.  She denies any fever, chills, nausea, vomiting or diarrhea.  She denies any drainage from the wound.  It is currently scabbed over.  The patient does wear medical grade 1 compression stockings however not frequently due to them being difficult placement.   Review of Systems  Cardiovascular: Positive for leg swelling.  Skin: Positive for wound.  All other systems reviewed and are negative.      Objective:   Physical Exam HENT:     Head: Normocephalic.  Cardiovascular:     Rate and Rhythm: Normal rate and regular rhythm.     Pulses: Normal pulses.     Comments: Large varicosities bilaterally Pulmonary:     Effort: Pulmonary effort is normal.  Skin:    Capillary Refill: Capillary refill takes less than 2 seconds.  Neurological:     Mental Status: She is alert and oriented to person, place, and time.  Psychiatric:        Mood and Affect: Mood normal.        Behavior: Behavior normal.        Thought Content: Thought content normal.        Judgment: Judgment normal.     BP (!) 167/80 (BP Location: Right Arm)   Pulse 65   Resp 16   Wt 101 lb 6.4 oz (46 kg)   BMI 19.80 kg/m   Past Medical History:  Diagnosis Date  . Allergy   . Arthritis   .  Atherosclerosis   . Atherosclerosis of abdominal aorta ([place])   . Chronic kidney disease    stage 2  . CKD (chronic kidney disease) stage 2, GFR 60-89 ml/min   . Collagen vascular disease ([place])   . Diverticulosis 2008  . GERD (gastroesophageal reflux disease)   . Heart murmur   . Hyperlipidemia   . Lumbar spinal stenosis   . Osteoporosis   . Ovarian lump   . RA (rheumatoid arthritis) ([place])   . Raynaud's disease   . Rheumatoid arthritis ([place])   . Spinal stenosis     Social History   Socioeconomic History  . Marital status: Widowed    Spouse name: Gwyndolyn Saxon  . Number of children: 3  . Years of education: some college  . Highest education level: 12th grade  Occupational History    Employer: RETIRED  Tobacco Use  . Smoking status: Never Smoker  . Smokeless tobacco: Never Used  . Tobacco comment: smoking cessation materials not required  Vaping Use  . Vaping Use: Never used  Substance and Sexual Activity  . Alcohol use: No    Alcohol/week: 0.0 standard drinks  . Drug use: No  . Sexual activity: Not Currently  Other Topics Concern  . Not on file  Social History Narrative  . Not on file   Social Determinants of Health   Financial Resource Strain:   . Difficulty of Paying Living Expenses: Not on file  Food Insecurity:   . Worried About Charity fundraiser in the Last Year: Not on file  . Ran Out of Food in the Last Year: Not on file  Transportation Needs:   . Lack of Transportation (Medical): Not on file  . Lack of Transportation (Non-Medical): Not on file  Physical Activity:   . Days of Exercise per Week: Not on file  . Minutes of Exercise per Session: Not on file  Stress:   . Feeling of Stress : Not on file  Social Connections:   . Frequency of Communication with Friends and Family: Not on file  . Frequency of Social Gatherings with Friends and Family: Not on file  . Attends Religious Services: Not on file  . Active Member of Clubs or Organizations: Not on file    . Attends Archivist Meetings: Not on file  . Marital Status: Not on file  Intimate Partner Violence:   . Fear of Current or Ex-Partner: Not on file  . Emotionally Abused: Not on file  . Physically Abused: Not on file  . Sexually Abused: Not on file    Past Surgical History:  Procedure Laterality Date  . BREAST SURGERY Left 1965   Benign biopsy  . CHOLECYSTECTOMY  2005   [facility]  . COLONOSCOPY  2008   Dr. Donnella Sham  . EXCISION OF BREAST BIOPSY Left 1965   benign  . EYE SURGERY Bilateral    Cataract Extraction with IOL  . KYPHOPLASTY N/A 12/17/2016   Procedure: KYPHOPLASTY;  Surgeon: Hessie Knows, MD;  Location: [facility] OR[facility];  Service: Orthopedics;  Laterality: N/A;  l4  . LUMBAR LAMINECTOMY  05/09/2017   L2-L5  . VISCERAL ANGIOGRAPHY N/A 08/15/2017   Procedure: VISCERAL ANGIOGRAPHY;  Surgeon: Algernon Huxley, MD;  Location: [place];  Service: Cardiovascular;  Laterality: N/A;  . VISCERAL ANGIOGRAPHY N/A 10/05/2017   Procedure: VISCERAL ANGIOGRAPHY;  Surgeon: Algernon Huxley, MD;  Location: [place] [facility];  Service: Cardiovascular;  Laterality: N/A;  . VISCERAL ARTERY INTERVENTION N/A 08/15/2017   Procedure: VISCERAL ARTERY INTERVENTION;  Surgeon: Algernon Huxley, MD;  Location: [place];  Service: Cardiovascular;  Laterality: N/A;    Family History  Problem Relation Age of Onset  . Heart disease Mother   . Hypertension Father   . Heart disease Brother 60       heart attack  . Cancer Brother        bladder  . Diabetes Sister   . Alcohol abuse Sister   . Kidney disease Sister   . Diabetes Brother   . Stroke Brother   . Diabetes Maternal Grandmother   . Heart disease Maternal Grandmother   . Cancer Brother        lung    Allergies  Allergen Reactions  . Ibandronic Acid Other (See Comments)    Achy all over. Flu like S/S  . Other Other (See Comments)    Achy all over. Flu like S/S Dysphagia  . Prednisone Rash  . Actonel [Risedronate]  Other (See Comments)    Dysphagia  . Amoxicillin Hives  . Pantoprazole Other (See Comments)    Little blistery bumps  . Raloxifene Other (See Comments)    Mood swings  .  Versed [Midazolam] Other (See Comments)    Difficult waking up and memory loss  . Doxycycline Rash    CBC Latest Ref Rng & Units 05/22/2020 03/27/2020 03/26/2020  WBC 4.0 - 10.5 K/uL 5.9 5.2 5.1  Hemoglobin 12.0 - 15.0 g/dL 13.1 14.0 14.3  Hematocrit 36 - 46 % 40.0 42.1 43.9  Platelets 150 - 400 K/uL 212 189 194      CMP     Component Value Date/Time   NA 138 05/28/2020 1237   NA 143 12/19/2015 1422   K 4.1 05/28/2020 1237   CL 102 05/28/2020 1237   CO2 30 05/28/2020 1237   GLUCOSE 90 05/28/2020 1237   BUN 12 05/28/2020 1237   BUN 15 12/19/2015 1422   CREATININE 0.68 05/28/2020 1237   CREATININE 0.62 01/26/2019 1542   CALCIUM 9.3 05/28/2020 1237   PROT 7.2 05/28/2020 1237   PROT 6.2 12/19/2015 1422   ALBUMIN 4.3 05/28/2020 1237   ALBUMIN 4.0 12/19/2015 1422   AST 39 (H) 05/28/2020 1237   ALT 19 05/28/2020 1237   ALKPHOS 58 05/28/2020 1237   BILITOT 0.5 05/28/2020 1237   BILITOT <0.2 12/19/2015 1422   GFRNONAA >60 05/22/2020 1536   GFRNONAA 80 01/26/2019 1542   GFRAA >60 05/22/2020 1536   GFRAA 93 01/26/2019 1542       Assessment & Plan:   1. Puncture wound of right lower leg, subsequent encounter I suspect the reason the wound is having difficulty with healing is because it is located over varicosity.  The patient has now been utilizing compression on a consistent basis due to their difficulty.  Continued distention from the varicosities is likely delaying wound healing.  We will place the patient in medical grade 1 compression stockings in order to allow the wound time to heal.  The patient is advised to continue with elevation and exercise as possible.  We will have the patient return on a weekly basis for wound evaluation.  We will reevaluate progress in 4 weeks.  2. Varicose veins of leg with  pain, right In addition to the Rives wraps placed today, we will plan on performing a venous reflux study in order to evaluate for venous reflux.  I strongly suspect that the patient does have venous reflux and possible endovenous ablation may be necessary to assist with wound healing if it does not help with compression and conservative therapy.   Current Outpatient Medications on File Prior to Visit  Medication Sig Dispense Refill  . acetaminophen (TYLENOL) 500 MG tablet Take 500 mg by mouth every 6 (six) hours as needed for mild pain.    . Alfalfa 500 MG TABS Take 500 mg by mouth daily.     . Ascorbic Acid (VITAMIN C CR) 500 MG CPCR Take 500 mg by mouth daily.    [name] aspirin EC 81 MG EC tablet Take 1 tablet (81 mg total) by mouth every evening. 120 tablet 0  . atorvastatin (LIPITOR) 10 MG tablet Take 1 tablet (10 mg total) by mouth every evening. 30 tablet 6  . Calcium Carbonate (CALCIUM 600 PO) Take by mouth.    . Cholecalciferol (VITAMIN D3) 3000 UNITS TABS Take 3,000 Units by mouth 2 (two) times daily.     [name] CINNAMON PO Take 1,000 mg by mouth daily.     . clotrimazole-betamethasone (LOTRISONE) cream Apply topically 2 (two) times daily. (Patient taking differently: Apply topically 2 (two) times daily as needed. ) 30 g 1  . diclofenac sodium (VOLTAREN)  1 % GEL Apply topically as needed.     . famotidine (PEPCID) 20 MG tablet Take 1 tablet (20 mg total) by mouth 2 (two) times daily. 60 tablet 1  . fluticasone (FLONASE) 50 MCG/ACT nasal spray Place 1 spray into both nostrils daily.    . Ginkgo Biloba (GNP GINGKO BILOBA EXTRACT PO) Take 1 tablet by mouth daily.    . Glucosamine Sulfate 500 MG CAPS Take 500 mg by mouth daily.    . isosorbide mononitrate (IMDUR) 30 MG 24 hr tablet Take 0.5 tablets (15 mg total) by mouth daily. 45 tablet 3  . Lactobacillus (PROBIOTIC ACIDOPHILUS PO) Take 1 capsule by mouth daily.     [name] loratadine (CLARITIN) 10 MG tablet Take 10 mg by mouth daily as needed for  allergies.    . Magnesium 400 MG CAPS Take 400 mg by mouth daily.    . metoprolol succinate (TOPROL XL) 25 MG 24 hr tablet Take 0.5 tablets (12.5 mg total) by mouth daily. 45 tablet 3  . Multiple Vitamin (MULTIVITAMIN) capsule Take 1 capsule by mouth daily.    . mupirocin ointment (BACTROBAN) 2 % Apply to right leg wound daily - a small dab as needed once a day. 15 g 0  . nitroGLYCERIN (NITROSTAT) 0.4 MG SL tablet Place 1 tablet (0.4 mg total) under the tongue every 5 (five) minutes as needed for chest pain. 50 tablet 3  . Omega-3 Fatty Acids (OMEGA 3 PO) Take 520 mg by mouth daily.     . Pramoxine-HC (HYDROCORTISONE ACE-PRAMOXINE) 2.5-1 % CREA Apply topically as needed.     . Turmeric 450 MG CAPS Take 450 Units by mouth daily.     . vitamin B-12 (CYANOCOBALAMIN) 1000 MCG tablet Take 1,000 mcg by mouth daily.    [name] omeprazole (PRILOSEC) 20 MG capsule Take 1 capsule (20 mg total) by mouth in the morning and at bedtime for 14 days. 28 capsule 0   No current facility-administered medications on file prior to visit.    There are no Patient Instructions on file for this visit. No follow-ups on file.   Kris Hartmann, NP

## 2020-09-08 ENCOUNTER — Ambulatory Visit (INDEPENDENT_AMBULATORY_CARE_PROVIDER_SITE_OTHER): Payer: Medicare Other | Admitting: Nurse Practitioner

## 2020-09-08 ENCOUNTER — Encounter (INDEPENDENT_AMBULATORY_CARE_PROVIDER_SITE_OTHER): Payer: Self-pay | Admitting: Nurse Practitioner

## 2020-09-08 ENCOUNTER — Other Ambulatory Visit: Payer: Self-pay

## 2020-09-08 VITALS — BP 180/76 | HR 59 | Ht 60.0 in | Wt 101.0 lb

## 2020-09-08 DIAGNOSIS — S81831D Puncture wound without foreign body, right lower leg, subsequent encounter: Secondary | ICD-10-CM | POA: Diagnosis not present

## 2020-09-08 NOTE — Progress Notes (Signed)
History of Present Illness  There is no documented history at this time  Assessments & Plan   There are no diagnoses linked to this encounter.    Additional instructions  Subjective:  Patient presents with venous ulcer of the Right lower extremity.    Procedure:  3 layer unna wrap was placed Right lower extremity.   Plan:   Follow up in one week.   

## 2020-09-15 ENCOUNTER — Ambulatory Visit (INDEPENDENT_AMBULATORY_CARE_PROVIDER_SITE_OTHER): Payer: Medicare Other | Admitting: Nurse Practitioner

## 2020-09-15 ENCOUNTER — Other Ambulatory Visit: Payer: Self-pay

## 2020-09-15 ENCOUNTER — Encounter (INDEPENDENT_AMBULATORY_CARE_PROVIDER_SITE_OTHER): Payer: Self-pay | Admitting: Nurse Practitioner

## 2020-09-15 VITALS — BP 182/84 | HR 59 | Ht 60.0 in

## 2020-09-15 DIAGNOSIS — S81831D Puncture wound without foreign body, right lower leg, subsequent encounter: Secondary | ICD-10-CM | POA: Diagnosis not present

## 2020-09-15 NOTE — Progress Notes (Signed)
History of Present Illness  There is no documented history at this time  Assessments & Plan   There are no diagnoses linked to this encounter.    Additional instructions  Subjective:  Patient presents with venous ulcer of the Right lower extremity.    Procedure:  3 layer unna wrap was placed Right lower extremity.   Plan:   Follow up in one week.   

## 2020-09-16 ENCOUNTER — Encounter: Payer: Self-pay | Admitting: Internal Medicine

## 2020-09-16 ENCOUNTER — Ambulatory Visit (INDEPENDENT_AMBULATORY_CARE_PROVIDER_SITE_OTHER): Payer: Medicare Other | Admitting: Internal Medicine

## 2020-09-16 DIAGNOSIS — Z8619 Personal history of other infectious and parasitic diseases: Secondary | ICD-10-CM | POA: Diagnosis not present

## 2020-09-16 DIAGNOSIS — I208 Other forms of angina pectoris: Secondary | ICD-10-CM

## 2020-09-16 DIAGNOSIS — I10 Essential (primary) hypertension: Secondary | ICD-10-CM

## 2020-09-16 DIAGNOSIS — I83811 Varicose veins of right lower extremities with pain: Secondary | ICD-10-CM | POA: Diagnosis not present

## 2020-09-16 MED ORDER — HYDRALAZINE HCL 10 MG PO TABS: 10.0000 mg | ORAL_TABLET | Freq: Three times a day (TID) | ORAL | 0 refills | Status: DC

## 2020-09-16 NOTE — Patient Instructions (Signed)
I will look through your chart to see what DR Bonna Gains treated you for!   I have sent a prescription for hydralazine to [facility].  Use this up to lower your blood pressure when it is 170 or higher.  You can use up to 25 mg if needed,  But the starting dose is 10 mg

## 2020-09-16 NOTE — Progress Notes (Signed)
Subjective:  Patient ID: [name], female    DOB: 10/17/1927  Age: 86 y.o. MRN: [id number]  CC: Diagnoses of Accelerated hypertension, Varicose veins of leg with pain, right, and History of hepatitis A were pertinent to this visit.  HPI [name] presents for 3 week follow up on leg pain.    This visit occurred during the SARS-CoV-2 public health emergency.  Safety protocols were in place, including screening questions prior to the visit, additional usage of staff PPE, and extensive cleaning of exam room while observing appropriate contact time as indicated for disinfecting solutions.     Patient has received both doses of the available COVID 19 vaccine without complications.  Patient continues to mask when outside of the home except when walking in yard or at safe distances from others .  Patient denies any change in mood or development of unhealthy behaviors resuting from the pandemic's restriction of activities and socialization.    86 yr old female with chronic pain seen 3 weeks ago by Dawson Bills for nonhealing wounds (3) to lower extremities that have failed to heal in the setting of varicose veins .    Has been wearing compression wraps put on by Vascular Surgery RN  Daily   Takes them off on Sunday   Has [place] scheduled for next Monday   Outpatient Medications Prior to Visit  Medication Sig Dispense Refill  . acetaminophen (TYLENOL) 500 MG tablet Take 500 mg by mouth every 6 (six) hours as needed for mild pain.    . Alfalfa 500 MG TABS Take 500 mg by mouth daily.     . Ascorbic Acid (VITAMIN C CR) 500 MG CPCR Take 500 mg by mouth daily.    [name] aspirin EC 81 MG EC tablet Take 1 tablet (81 mg total) by mouth every evening. 120 tablet 0  . atorvastatin (LIPITOR) 10 MG tablet Take 1 tablet (10 mg total) by mouth every evening. 30 tablet 6  . Calcium Carbonate (CALCIUM 600 PO) Take by mouth.    . Cholecalciferol (VITAMIN D3) 3000 UNITS TABS Take 3,000 Units by mouth 2  (two) times daily.     [name] CINNAMON PO Take 1,000 mg by mouth daily.     . clotrimazole-betamethasone (LOTRISONE) cream Apply topically 2 (two) times daily. (Patient taking differently: Apply topically 2 (two) times daily as needed. ) 30 g 1  . diclofenac sodium (VOLTAREN) 1 % GEL Apply topically as needed.     . famotidine (PEPCID) 20 MG tablet Take 1 tablet (20 mg total) by mouth 2 (two) times daily. 60 tablet 1  . fluticasone (FLONASE) 50 MCG/ACT nasal spray Place 1 spray into both nostrils daily.    . Ginkgo Biloba (GNP GINGKO BILOBA EXTRACT PO) Take 1 tablet by mouth daily.    . Glucosamine Sulfate 500 MG CAPS Take 500 mg by mouth daily.    . isosorbide mononitrate (IMDUR) 30 MG 24 hr tablet Take 0.5 tablets (15 mg total) by mouth daily. 45 tablet 3  . Lactobacillus (PROBIOTIC ACIDOPHILUS PO) Take 1 capsule by mouth daily.     [name] loratadine (CLARITIN) 10 MG tablet Take 10 mg by mouth daily as needed for allergies.    . Magnesium 400 MG CAPS Take 400 mg by mouth daily.    . metoprolol succinate (TOPROL XL) 25 MG 24 hr tablet Take 0.5 tablets (12.5 mg total) by mouth daily. 45 tablet 3  . Multiple Vitamin (MULTIVITAMIN) capsule Take 1 capsule by  mouth daily.    . mupirocin ointment (BACTROBAN) 2 % Apply to right leg wound daily - a small dab as needed once a day. 15 g 0  . nitroGLYCERIN (NITROSTAT) 0.4 MG SL tablet Place 1 tablet (0.4 mg total) under the tongue every 5 (five) minutes as needed for chest pain. 50 tablet 3  . Omega-3 Fatty Acids (OMEGA 3 PO) Take 520 mg by mouth daily.     . Pramoxine-HC (HYDROCORTISONE ACE-PRAMOXINE) 2.5-1 % CREA Apply topically as needed.     . Turmeric 450 MG CAPS Take 450 Units by mouth daily.     . vitamin B-12 (CYANOCOBALAMIN) 1000 MCG tablet Take 1,000 mcg by mouth daily.    [name] omeprazole (PRILOSEC) 20 MG capsule Take 1 capsule (20 mg total) by mouth in the morning and at bedtime for 14 days. 28 capsule 0   No facility-administered medications prior to  visit.    Review of Systems;  Patient denies headache, fevers, malaise, unintentional weight loss, skin rash, eye pain, sinus congestion and sinus pain, sore throat, dysphagia,  hemoptysis , cough, dyspnea, wheezing, chest pain, palpitations, orthopnea, edema, abdominal pain, nausea, melena, diarrhea, constipation, flank pain, dysuria, hematuria, urinary  Frequency, nocturia, numbness, tingling, seizures,  Focal weakness, Loss of consciousness,  Tremor, insomnia, depression, anxiety, and suicidal ideation.      Objective:  BP 122/82 (BP Location: Left Arm, Patient Position: Sitting, Cuff Size: Normal)   Pulse 80   Temp 97.9 F (36.6 C) (Oral)   Resp 16   Ht 5' (1.524 m)   Wt 100 lb 6.4 oz (45.5 kg)   SpO2 98%   BMI 19.61 kg/m   BP Readings from Last 3 Encounters:  09/16/20 122/82  09/15/20 (!) 182/84  09/08/20 (!) 180/76    Wt Readings from Last 3 Encounters:  09/16/20 100 lb 6.4 oz (45.5 kg)  09/08/20 101 lb (45.8 kg)  09/01/20 101 lb 6.4 oz (46 kg)    General appearance: alert, cooperative and appears stated age Ears: normal TM's and external ear canals both ears Throat: lips, mucosa, and tongue normal; teeth and gums normal Neck: no adenopathy, no carotid bruit, supple, symmetrical, trachea midline and thyroid not enlarged, symmetric, no tenderness/mass/nodules Back: symmetric, no curvature. ROM normal. No CVA tenderness. Lungs: clear to auscultation bilaterally Heart: regular rate and rhythm, S1, S2 normal, no murmur, click, rub or gallop Abdomen: soft, non-tender; bowel sounds normal; no masses,  no organomegaly Pulses: 2+ and symmetric Skin: Skin color, texture, turgor normal. No rashes or lesions Lymph nodes: Cervical, supraclavicular, and axillary nodes normal.  Lab Results  Component Value Date   HGBA1C 6.1 (H) 05/14/2019   HGBA1C 6.2 (H) 01/01/2019   HGBA1C 6.1 (H) 03/14/2018    Lab Results  Component Value Date   CREATININE 0.68 05/28/2020    CREATININE 0.68 05/22/2020   CREATININE 0.59 03/27/2020    Lab Results  Component Value Date   WBC 5.9 05/22/2020   HGB 13.1 05/22/2020   HCT 40.0 05/22/2020   PLT 212 05/22/2020   GLUCOSE 90 05/28/2020   CHOL 137 04/03/2020   TRIG 95.0 04/03/2020   HDL 62.80 04/03/2020   LDLCALC 55 04/03/2020   ALT 19 05/28/2020   AST 39 (H) 05/28/2020   NA 138 05/28/2020   K 4.1 05/28/2020   CL 102 05/28/2020   CREATININE 0.68 05/28/2020   BUN 12 05/28/2020   CO2 30 05/28/2020   TSH 2.22 03/12/2020   HGBA1C 6.1 (H) 05/14/2019  No results found.  Assessment & Plan:   Problem List Items Addressed This Visit      Unprioritized   Accelerated hypertension    She continues to have episodes of elevated systolic pressures Adding hydralazine 10 mg tid prn sbp > 270      Relevant Medications   hydrALAZINE (APRESOLINE) 10 MG tablet   History of hepatitis A    Date of infection unclear. She has a remote ER visit  For dehydration due to gastroenteritis but records are not available       Varicose veins of leg with pain, right    With venous ulcers bilaterally , managed with compression wraps wound care. .  For ABIs next week       Relevant Medications   hydrALAZINE (APRESOLINE) 10 MG tablet     A total of 40 minutes was spent with patient more than half of which was spent in counseling patient on the above mentioned issues , reviewing and explaining recent labs and imaging studies done, and coordination of care.   I have discontinued Mable T. Boman's omeprazole. I am also having her start on hydrALAZINE. Additionally, I am having her maintain her Vitamin D3, multivitamin, Omega-3 Fatty Acids (OMEGA 3 PO), Vitamin C CR, Lactobacillus (PROBIOTIC ACIDOPHILUS PO), Glucosamine Sulfate, vitamin B-12, CINNAMON PO, Turmeric, acetaminophen, Magnesium, loratadine, Ginkgo Biloba (GNP GINGKO BILOBA EXTRACT PO), Hydrocortisone Ace-Pramoxine, fluticasone, clotrimazole-betamethasone, diclofenac  sodium, atorvastatin, Alfalfa, aspirin, nitroGLYCERIN, metoprolol succinate, isosorbide mononitrate, famotidine, Calcium Carbonate (CALCIUM 600 PO), and mupirocin ointment.  Meds ordered this encounter  Medications  . hydrALAZINE (APRESOLINE) 10 MG tablet    Sig: Take 1 tablet (10 mg total) by mouth 3 (three) times daily. As needed for sbp  170 or higher    Dispense:  30 tablet    Refill:  0    Medications Discontinued During This Encounter  Medication Reason  . omeprazole (PRILOSEC) 20 MG capsule Completed Course    Follow-up: No follow-ups on file.   Crecencio Mc, MD

## 2020-09-16 NOTE — Assessment & Plan Note (Signed)
She continues to have episodes of elevated systolic pressures Adding hydralazine 10 mg tid prn sbp > 270

## 2020-09-16 NOTE — Assessment & Plan Note (Signed)
With venous ulcers bilaterally , managed with compression wraps wound care. .  For ABIs next week

## 2020-09-16 NOTE — Assessment & Plan Note (Signed)
Date of infection unclear. She has a remote ER visit  For dehydration due to gastroenteritis but records are not available

## 2020-09-19 ENCOUNTER — Other Ambulatory Visit (INDEPENDENT_AMBULATORY_CARE_PROVIDER_SITE_OTHER): Payer: Self-pay | Admitting: Nurse Practitioner

## 2020-09-19 DIAGNOSIS — S81831D Puncture wound without foreign body, right lower leg, subsequent encounter: Secondary | ICD-10-CM

## 2020-09-22 ENCOUNTER — Encounter (INDEPENDENT_AMBULATORY_CARE_PROVIDER_SITE_OTHER): Payer: Self-pay | Admitting: Nurse Practitioner

## 2020-09-22 ENCOUNTER — Ambulatory Visit (INDEPENDENT_AMBULATORY_CARE_PROVIDER_SITE_OTHER): Payer: Medicare Other | Admitting: Nurse Practitioner

## 2020-09-22 ENCOUNTER — Ambulatory Visit (INDEPENDENT_AMBULATORY_CARE_PROVIDER_SITE_OTHER): Payer: Medicare Other

## 2020-09-22 ENCOUNTER — Other Ambulatory Visit: Payer: Self-pay

## 2020-09-22 VITALS — BP 180/79 | HR 58 | Resp 16 | Wt 101.8 lb

## 2020-09-22 DIAGNOSIS — E782 Mixed hyperlipidemia: Secondary | ICD-10-CM

## 2020-09-22 DIAGNOSIS — S81831D Puncture wound without foreign body, right lower leg, subsequent encounter: Secondary | ICD-10-CM

## 2020-09-22 DIAGNOSIS — I10 Essential (primary) hypertension: Secondary | ICD-10-CM

## 2020-09-24 ENCOUNTER — Ambulatory Visit (INDEPENDENT_AMBULATORY_CARE_PROVIDER_SITE_OTHER): Payer: Medicare Other | Admitting: Gastroenterology

## 2020-09-24 ENCOUNTER — Encounter: Payer: Self-pay | Admitting: Gastroenterology

## 2020-09-24 ENCOUNTER — Other Ambulatory Visit: Payer: Self-pay

## 2020-09-24 VITALS — BP 150/82 | HR 72 | Temp 98.1°F | Ht 60.0 in | Wt 101.6 lb

## 2020-09-24 DIAGNOSIS — R1013 Epigastric pain: Secondary | ICD-10-CM

## 2020-09-24 DIAGNOSIS — R1319 Other dysphagia: Secondary | ICD-10-CM

## 2020-09-24 NOTE — Progress Notes (Signed)
Brandy Antigua, MD [address]  [place]  [place], [place] [address]  Main: [phone]  Fax: [phone]   Primary Care Physician: Crecencio Mc, MD   Chief Complaint  Patient presents with  . Abdominal Pain    HPI: [name] is a 86 y.o. female here for follow-up abdominal pain.  Since being treated for H. pylori she states her pain has significantly improved.  Reports good appetite. The patient denies abdominal or flank pain, anorexia, nausea or vomiting, dysphagia, change in bowel habits or black or bloody stools or weight loss.  She does report intermittent dysphagia to solids and liquids.  She had swallow evaluation in January 2020, that showed mild premature spillover, trace laryngeal penetration and mild retention on thin liquids.  She was recommended to be on regular solids with thin liquids by speech pathology.  She reports an upper endoscopy 20 years ago but was not having the symptoms at that time.  She would like to avoid any invasive procedures   Current Outpatient Medications  Medication Sig Dispense Refill  . acetaminophen (TYLENOL) 500 MG tablet Take 500 mg by mouth every 6 (six) hours as needed for mild pain.    . Alfalfa 500 MG TABS Take 500 mg by mouth daily.     . Ascorbic Acid (VITAMIN C CR) 500 MG CPCR Take 500 mg by mouth daily.    [name] aspirin EC 81 MG EC tablet Take 1 tablet (81 mg total) by mouth every evening. 120 tablet 0  . atorvastatin (LIPITOR) 10 MG tablet Take 1 tablet (10 mg total) by mouth every evening. 30 tablet 6  . Calcium Carbonate (CALCIUM 600 PO) Take by mouth.    . Cholecalciferol (VITAMIN D3) 3000 UNITS TABS Take 3,000 Units by mouth 2 (two) times daily.     [name] CINNAMON PO Take 1,000 mg by mouth daily.     . clotrimazole-betamethasone (LOTRISONE) cream Apply topically 2 (two) times daily. (Patient taking differently: Apply topically 2 (two) times daily as needed. ) 30 g 1  . diclofenac sodium (VOLTAREN) 1 % GEL Apply  topically as needed.     . famotidine (PEPCID) 20 MG tablet Take 1 tablet (20 mg total) by mouth 2 (two) times daily. 60 tablet 1  . fluticasone (FLONASE) 50 MCG/ACT nasal spray Place 1 spray into both nostrils daily.    . Ginkgo Biloba (GNP GINGKO BILOBA EXTRACT PO) Take 1 tablet by mouth daily.    . Glucosamine Sulfate 500 MG CAPS Take 500 mg by mouth daily.    . hydrALAZINE (APRESOLINE) 10 MG tablet Take 1 tablet (10 mg total) by mouth 3 (three) times daily. As needed for sbp  170 or higher 30 tablet 0  . isosorbide mononitrate (IMDUR) 30 MG 24 hr tablet Take 0.5 tablets (15 mg total) by mouth daily. 45 tablet 3  . Lactobacillus (PROBIOTIC ACIDOPHILUS PO) Take 1 capsule by mouth daily.     [name] loratadine (CLARITIN) 10 MG tablet Take 10 mg by mouth daily as needed for allergies.    . Magnesium 400 MG CAPS Take 400 mg by mouth daily.    . metoprolol succinate (TOPROL XL) 25 MG 24 hr tablet Take 0.5 tablets (12.5 mg total) by mouth daily. 45 tablet 3  . Multiple Vitamin (MULTIVITAMIN) capsule Take 1 capsule by mouth daily.    . mupirocin ointment (BACTROBAN) 2 % Apply to right leg wound daily - a small dab as needed once a day.  15 g 0  . nitroGLYCERIN (NITROSTAT) 0.4 MG SL tablet Place 1 tablet (0.4 mg total) under the tongue every 5 (five) minutes as needed for chest pain. 50 tablet 3  . Omega-3 Fatty Acids (OMEGA 3 PO) Take 520 mg by mouth daily.     . Pramoxine-HC (HYDROCORTISONE ACE-PRAMOXINE) 2.5-1 % CREA Apply topically as needed.     . Turmeric 450 MG CAPS Take 450 Units by mouth daily.     . vitamin B-12 (CYANOCOBALAMIN) 1000 MCG tablet Take 1,000 mcg by mouth daily.     No current facility-administered medications for this visit.    Allergies as of 09/24/2020 - Review Complete 09/24/2020  Allergen Reaction Noted  . Ibandronic acid Other (See Comments) 08/14/2015  . Other Other (See Comments) 01/19/2012  . Prednisone Rash 12/26/2014  . Actonel [risedronate] Other (See Comments)  06/19/2014  . Amoxicillin Hives 04/15/2020  . Pantoprazole Other (See Comments) 08/11/2020  . Raloxifene Other (See Comments) 08/14/2015  . Versed [midazolam] Other (See Comments) 12/16/2016  . Doxycycline Rash 12/26/2014    ROS:  General: Negative for anorexia, weight loss, fever, chills, fatigue, weakness. ENT: Negative for hoarseness, difficulty swallowing , nasal congestion. CV: Negative for chest pain, angina, palpitations, dyspnea on exertion, peripheral edema.  Respiratory: Negative for dyspnea at rest, dyspnea on exertion, cough, sputum, wheezing.  GI: See history of present illness. GU:  Negative for dysuria, hematuria, urinary incontinence, urinary frequency, nocturnal urination.  Endo: Negative for unusual weight change.    Physical Examination:   BP (!) 150/82   Pulse 72   Temp 98.1 F (36.7 C) (Oral)   Ht 5' (1.524 m)   Wt 101 lb 9.6 oz (46.1 kg)   BMI 19.84 kg/m   General: Well-nourished, well-developed in no acute distress.  Eyes: No icterus. Conjunctivae pink. Mouth: Oropharyngeal mucosa moist and pink , no lesions erythema or exudate. Neck: Supple, Trachea midline Abdomen: Bowel sounds are normal, nontender, nondistended, no hepatosplenomegaly or masses, no abdominal bruits or hernia , no rebound or guarding.   Extremities: No lower extremity edema. No clubbing or deformities. Neuro: Alert and oriented x 3.  Grossly intact. Skin: Warm and dry, no jaundice.   Psych: Alert and cooperative, normal mood and affect.   Labs: CMP     Component Value Date/Time   NA 138 05/28/2020 1237   NA 143 12/19/2015 1422   K 4.1 05/28/2020 1237   CL 102 05/28/2020 1237   CO2 30 05/28/2020 1237   GLUCOSE 90 05/28/2020 1237   BUN 12 05/28/2020 1237   BUN 15 12/19/2015 1422   CREATININE 0.68 05/28/2020 1237   CREATININE 0.62 01/26/2019 1542   CALCIUM 9.3 05/28/2020 1237   PROT 7.2 05/28/2020 1237   PROT 6.2 12/19/2015 1422   ALBUMIN 4.3 05/28/2020 1237   ALBUMIN  4.0 12/19/2015 1422   AST 39 (H) 05/28/2020 1237   ALT 19 05/28/2020 1237   ALKPHOS 58 05/28/2020 1237   BILITOT 0.5 05/28/2020 1237   BILITOT <0.2 12/19/2015 1422   GFRNONAA >60 05/22/2020 1536   GFRNONAA 80 01/26/2019 1542   GFRAA >60 05/22/2020 1536   GFRAA 93 01/26/2019 1542   Lab Results  Component Value Date   WBC 5.9 05/22/2020   HGB 13.1 05/22/2020   HCT 40.0 05/22/2020   MCV 88.5 05/22/2020   PLT 212 05/22/2020    Imaging Studies: DG Tibia/Fibula Right  Result Date: 08/26/2020 CLINICAL DATA:  Lower leg puncture wound EXAM: RIGHT TIBIA AND FIBULA -  2 VIEW COMPARISON:  None. FINDINGS: No fracture or malalignment. No radiopaque foreign body. Tortuous serpiginous soft tissue structures along the medial lower leg presumably varicosities. IMPRESSION: No acute osseous abnormality. Electronically Signed   By: Donavan Foil M.D.   On: 08/26/2020 23:46   VAS [place] ABI WITH/WO TBI  Result Date: 09/23/2020 LOWER EXTREMITY DOPPLER STUDY Indications: Peripheral artery disease, and Puncture Wound Rt leg. Other Factors: Diagnosis: Leg pain.  Comparison Study: 05/21/2020 Performing Technologist: Almira Coaster RVS  Examination Guidelines: A complete evaluation includes at minimum, Doppler waveform signals and systolic blood pressure reading at the level of bilateral brachial, anterior tibial, and posterior tibial arteries, when vessel segments are accessible. Bilateral testing is considered an integral part of a complete examination. Photoelectric Plethysmograph (PPG) waveforms and toe systolic pressure readings are included as required and additional duplex testing as needed. Limited examinations for reoccurring indications may be performed as noted.  ABI Findings: +---------+------------------+-----+---------+--------+ Right    Rt Pressure (mmHg)IndexWaveform Comment  +---------+------------------+-----+---------+--------+ Brachial 140                                       +---------+------------------+-----+---------+--------+ ATA      171               1.22 triphasic         +---------+------------------+-----+---------+--------+ PTA      154               1.10 triphasic         +---------+------------------+-----+---------+--------+ Great Toe101               0.72 Normal            +---------+------------------+-----+---------+--------+ +---------+------------------+-----+---------+-------+ Left     Lt Pressure (mmHg)IndexWaveform Comment +---------+------------------+-----+---------+-------+ Brachial 135                                     +---------+------------------+-----+---------+-------+ ATA      134               0.96 triphasic        +---------+------------------+-----+---------+-------+ PTA      158               1.13 triphasic        +---------+------------------+-----+---------+-------+ Great Toe84                0.60 Abnormal         +---------+------------------+-----+---------+-------+ +-------+-----------+-----------+------------+------------+ ABI/TBIToday's ABIToday's TBIPrevious ABIPrevious TBI +-------+-----------+-----------+------------+------------+ Right  1.22       .72        1.34        .99          +-------+-----------+-----------+------------+------------+ Left   1.13       .60        1.22        1.04         +-------+-----------+-----------+------------+------------+ Bilateral TBIs appear decreased compared to prior study on 05/21/2020. Bilateral ABIs appear essentially unchanged compared to prior study on 05/21/2020.  Summary: Right: Resting right ankle-brachial index is within normal range. No evidence of significant right lower extremity arterial disease. The right toe-brachial index is normal. Left: Resting left ankle-brachial index is within normal range. No evidence of significant left lower extremity arterial disease. The left toe-brachial index is abnormal.  *See table(s) above  for  measurements and observations.  Electronically signed by Leotis Pain MD on 09/23/2020 at 8:26:17 AM.    Final     Assessment and Plan:   [name] is a 86 y.o. y/o female here for follow-up of abdominal pain and H. Pylori  Abdominal pain has resolved since being treated for H. Pylori  We will test for eradication  If pain resumes patient advised to call us back and she verbalized understanding  Due to her dysphagia, will order barium esophagram, as that would be the least invasive way to rule out any esophageal lesions.  However, symptoms are likely due to oropharyngeal dysphagia.  Patient interested in avoiding endoscopic procedures at this time  Dr [name]

## 2020-09-24 NOTE — Patient Instructions (Signed)
Please go to the [facility] on 10/01/2020 at 8:45 AM. Please do not eat or drink 3 hours prior to your exam. If you need to reschedule, please call [phone].

## 2020-09-25 LAB — H. PYLORI BREATH TEST: H pylori Breath Test: NEGATIVE

## 2020-09-28 ENCOUNTER — Encounter (INDEPENDENT_AMBULATORY_CARE_PROVIDER_SITE_OTHER): Payer: Self-pay | Admitting: Nurse Practitioner

## 2020-09-28 NOTE — Progress Notes (Signed)
Subjective:    Patient ID: [name], female    DOB: 01-18-1927, 86 y.o.   MRN: [id number] Chief Complaint  Patient presents with  . Follow-up    unna check    The patient returns today for evaluation of her right lower extremity following several weeks of Unna wraps. The patient had a slow healing wound on her right lower extremity that resulted from a branch falling and hitting her leg. This area was located over a large varicosity and there was concern that the wound may not have been healing due to a possible retained splinter. Today, the patient has underwent 4 weeks of Unna wraps. The wound has decreased significantly and there is just a small scab over the area. The patient has tolerated the Unna wraps well. In order to be sure that the patient's wound did not have delayed healing because of decreased perfusion the also have the patient undergo ABIs. The right ABI is 1.22 with a left of 1.13. The patient has triphasic tibial artery waveforms bilaterally with good toe waveforms bilaterally.    Review of Systems  Cardiovascular: Positive for leg swelling.  Skin: Positive for wound.  All other systems reviewed and are negative.      Objective:   Physical Exam Vitals reviewed.  HENT:     Head: Normocephalic.  Cardiovascular:     Rate and Rhythm: Normal rate.     Pulses: Normal pulses.  Pulmonary:     Effort: Pulmonary effort is normal.  Skin:    General: Skin is warm and dry.     Comments: Large varicosities bilaterally  Neurological:     Mental Status: She is alert and oriented to person, place, and time.  Psychiatric:        Mood and Affect: Mood normal.        Behavior: Behavior normal.        Thought Content: Thought content normal.        Judgment: Judgment normal.     BP (!) 180/79 (BP Location: Left Arm)   Pulse (!) 58   Resp 16   Wt 101 lb 12.8 oz (46.2 kg)   BMI 19.88 kg/m   Past Medical History:  Diagnosis Date  . Allergy   . Arthritis     . Atherosclerosis   . Atherosclerosis of abdominal aorta ([place])   . Chronic kidney disease    stage 2  . CKD (chronic kidney disease) stage 2, GFR 60-89 ml/min   . Collagen vascular disease ([place])   . Diverticulosis 2008  . GERD (gastroesophageal reflux disease)   . Heart murmur   . Hyperlipidemia   . Lumbar spinal stenosis   . Osteoporosis   . Ovarian lump   . RA (rheumatoid arthritis) ([place])   . Raynaud's disease   . Rheumatoid arthritis ([place])   . Spinal stenosis     Social History   Socioeconomic History  . Marital status: Widowed    Spouse name: Gwyndolyn Saxon  . Number of children: 3  . Years of education: some college  . Highest education level: 12th grade  Occupational History    Employer: RETIRED  Tobacco Use  . Smoking status: Never Smoker  . Smokeless tobacco: Never Used  . Tobacco comment: smoking cessation materials not required  Vaping Use  . Vaping Use: Never used  Substance and Sexual Activity  . Alcohol use: No    Alcohol/week: 0.0 standard drinks  . Drug use: No  . Sexual activity:  Not Currently  Other Topics Concern  . Not on file  Social History Narrative  . Not on file   Social Determinants of Health   Financial Resource Strain:   . Difficulty of Paying Living Expenses: Not on file  Food Insecurity:   . Worried About Charity fundraiser in the Last Year: Not on file  . Ran Out of Food in the Last Year: Not on file  Transportation Needs:   . Lack of Transportation (Medical): Not on file  . Lack of Transportation (Non-Medical): Not on file  Physical Activity:   . Days of Exercise per Week: Not on file  . Minutes of Exercise per Session: Not on file  Stress:   . Feeling of Stress : Not on file  Social Connections:   . Frequency of Communication with Friends and Family: Not on file  . Frequency of Social Gatherings with Friends and Family: Not on file  . Attends Religious Services: Not on file  . Active Member of Clubs or Organizations: Not on  file  . Attends Archivist Meetings: Not on file  . Marital Status: Not on file  Intimate Partner Violence:   . Fear of Current or Ex-Partner: Not on file  . Emotionally Abused: Not on file  . Physically Abused: Not on file  . Sexually Abused: Not on file    Past Surgical History:  Procedure Laterality Date  . BREAST SURGERY Left 1965   Benign biopsy  . CHOLECYSTECTOMY  2005   [place]  . COLONOSCOPY  2008   Dr. Donnella Sham  . EXCISION OF BREAST BIOPSY Left 1965   benign  . EYE SURGERY Bilateral    Cataract Extraction with IOL  . KYPHOPLASTY N/A 12/17/2016   Procedure: KYPHOPLASTY;  Surgeon: Hessie Knows, MD;  Location: [facility] ORS;  Service: Orthopedics;  Laterality: N/A;  l4  . LUMBAR LAMINECTOMY  05/09/2017   L2-L5  . VISCERAL ANGIOGRAPHY N/A 08/15/2017   Procedure: VISCERAL ANGIOGRAPHY;  Surgeon: Algernon Huxley, MD;  Location: [facility];  Service: Cardiovascular;  Laterality: N/A;  . VISCERAL ANGIOGRAPHY N/A 10/05/2017   Procedure: VISCERAL ANGIOGRAPHY;  Surgeon: Algernon Huxley, MD;  Location: [facility];  Service: Cardiovascular;  Laterality: N/A;  . VISCERAL ARTERY INTERVENTION N/A 08/15/2017   Procedure: VISCERAL ARTERY INTERVENTION;  Surgeon: Algernon Huxley, MD;  Location: [facility];  Service: Cardiovascular;  Laterality: N/A;    Family History  Problem Relation Age of Onset  . Heart disease Mother   . Hypertension Father   . Heart disease Brother 38       heart attack  . Cancer Brother        bladder  . Diabetes Sister   . Alcohol abuse Sister   . Kidney disease Sister   . Diabetes Brother   . Stroke Brother   . Diabetes Maternal Grandmother   . Heart disease Maternal Grandmother   . Cancer Brother        lung    Allergies  Allergen Reactions  . Ibandronic Acid Other (See Comments)    Achy all over. Flu like S/S  . Other Other (See Comments)    Achy all over. Flu like S/S Dysphagia  . Prednisone Rash  . Actonel  [Risedronate] Other (See Comments)    Dysphagia  . Amoxicillin Hives  . Pantoprazole Other (See Comments)    Little blistery bumps  . Raloxifene Other (See Comments)    Mood swings  .  Versed [Midazolam] Other (See Comments)    Difficult waking up and memory loss  . Doxycycline Rash    CBC Latest Ref Rng & Units 05/22/2020 03/27/2020 03/26/2020  WBC 4.0 - 10.5 K/uL 5.9 5.2 5.1  Hemoglobin 12.0 - 15.0 g/dL 13.1 14.0 14.3  Hematocrit 36 - 46 % 40.0 42.1 43.9  Platelets 150 - 400 K/uL 212 189 194      CMP     Component Value Date/Time   NA 138 05/28/2020 1237   NA 143 12/19/2015 1422   K 4.1 05/28/2020 1237   CL 102 05/28/2020 1237   CO2 30 05/28/2020 1237   GLUCOSE 90 05/28/2020 1237   BUN 12 05/28/2020 1237   BUN 15 12/19/2015 1422   CREATININE 0.68 05/28/2020 1237   CREATININE 0.62 01/26/2019 1542   CALCIUM 9.3 05/28/2020 1237   PROT 7.2 05/28/2020 1237   PROT 6.2 12/19/2015 1422   ALBUMIN 4.3 05/28/2020 1237   ALBUMIN 4.0 12/19/2015 1422   AST 39 (H) 05/28/2020 1237   ALT 19 05/28/2020 1237   ALKPHOS 58 05/28/2020 1237   BILITOT 0.5 05/28/2020 1237   BILITOT <0.2 12/19/2015 1422   GFRNONAA >60 05/22/2020 1536   GFRNONAA 80 01/26/2019 1542   GFRAA >60 05/22/2020 1536   GFRAA 93 01/26/2019 1542     VAS [place] ABI WITH/WO TBI  Result Date: 09/23/2020 LOWER EXTREMITY DOPPLER STUDY Indications: Peripheral artery disease, and Puncture Wound Rt leg. Other Factors: Diagnosis: Leg pain.  Comparison Study: 05/21/2020 Performing Technologist: Almira Coaster RVS  Examination Guidelines: A complete evaluation includes at minimum, Doppler waveform signals and systolic blood pressure reading at the level of bilateral brachial, anterior tibial, and posterior tibial arteries, when vessel segments are accessible. Bilateral testing is considered an integral part of a complete examination. Photoelectric Plethysmograph (PPG) waveforms and toe systolic pressure readings are included as  required and additional duplex testing as needed. Limited examinations for reoccurring indications may be performed as noted.  ABI Findings: +---------+------------------+-----+---------+--------+ Right    Rt Pressure (mmHg)IndexWaveform Comment  +---------+------------------+-----+---------+--------+ Brachial 140                                      +---------+------------------+-----+---------+--------+ ATA      171               1.22 triphasic         +---------+------------------+-----+---------+--------+ PTA      154               1.10 triphasic         +---------+------------------+-----+---------+--------+ Great Toe101               0.72 Normal            +---------+------------------+-----+---------+--------+ +---------+------------------+-----+---------+-------+ Left     Lt Pressure (mmHg)IndexWaveform Comment +---------+------------------+-----+---------+-------+ Brachial 135                                     +---------+------------------+-----+---------+-------+ ATA      134               0.96 triphasic        +---------+------------------+-----+---------+-------+ PTA      158               1.13 triphasic        +---------+------------------+-----+---------+-------+ [place]  Toe84                0.60 Abnormal         +---------+------------------+-----+---------+-------+ +-------+-----------+-----------+------------+------------+ ABI/TBIToday's ABIToday's TBIPrevious ABIPrevious TBI +-------+-----------+-----------+------------+------------+ Right  1.22       .72        1.34        .99          +-------+-----------+-----------+------------+------------+ Left   1.13       .60        1.22        1.04         +-------+-----------+-----------+------------+------------+ Bilateral TBIs appear decreased compared to prior study on 05/21/2020. Bilateral ABIs appear essentially unchanged compared to prior study on 05/21/2020.  Summary:  Right: Resting right ankle-brachial index is within normal range. No evidence of significant right lower extremity arterial disease. The right toe-brachial index is normal. Left: Resting left ankle-brachial index is within normal range. No evidence of significant left lower extremity arterial disease. The left toe-brachial index is abnormal.  *See table(s) above for measurements and observations.  Electronically signed by Leotis Pain MD on 09/23/2020 at 8:26:17 AM.    Final        Assessment & Plan:   1. Puncture wound of right lower leg, subsequent encounter The patient tolerated interacts well however the wound has decreased significantly enough in size so that the patient can transition to medical grade 1 compression stockings. The patient is instructed to utilize her compression stockings daily as well as to elevate her lower extremities whenever she is not being active. Activity is advised for at least 30 minutes 5 days a week. Patient is advised that if the wound worsens she should contact her office otherwise we will have the patient follow-up in 3 months.  2. Essential hypertension Continue antihypertensive medications as already ordered, these medications have been reviewed and there are no changes at this time.   3. Mixed hyperlipidemia Continue statin as ordered and reviewed, no changes at this time    Current Outpatient Medications on File Prior to Visit  Medication Sig Dispense Refill  . acetaminophen (TYLENOL) 500 MG tablet Take 500 mg by mouth every 6 (six) hours as needed for mild pain.    . Alfalfa 500 MG TABS Take 500 mg by mouth daily.     . Ascorbic Acid (VITAMIN C CR) 500 MG CPCR Take 500 mg by mouth daily.    [name] aspirin EC 81 MG EC tablet Take 1 tablet (81 mg total) by mouth every evening. 120 tablet 0  . atorvastatin (LIPITOR) 10 MG tablet Take 1 tablet (10 mg total) by mouth every evening. 30 tablet 6  . Calcium Carbonate (CALCIUM 600 PO) Take by mouth.    .  Cholecalciferol (VITAMIN D3) 3000 UNITS TABS Take 3,000 Units by mouth 2 (two) times daily.     [name] CINNAMON PO Take 1,000 mg by mouth daily.     . clotrimazole-betamethasone (LOTRISONE) cream Apply topically 2 (two) times daily. (Patient taking differently: Apply topically 2 (two) times daily as needed. ) 30 g 1  . diclofenac sodium (VOLTAREN) 1 % GEL Apply topically as needed.     . famotidine (PEPCID) 20 MG tablet Take 1 tablet (20 mg total) by mouth 2 (two) times daily. 60 tablet 1  . fluticasone (FLONASE) 50 MCG/ACT nasal spray Place 1 spray into both nostrils daily.    . Ginkgo Biloba (GNP GINGKO BILOBA EXTRACT PO) Take 1 tablet by mouth  daily.    . Glucosamine Sulfate 500 MG CAPS Take 500 mg by mouth daily.    . hydrALAZINE (APRESOLINE) 10 MG tablet Take 1 tablet (10 mg total) by mouth 3 (three) times daily. As needed for sbp  170 or higher 30 tablet 0  . isosorbide mononitrate (IMDUR) 30 MG 24 hr tablet Take 0.5 tablets (15 mg total) by mouth daily. 45 tablet 3  . Lactobacillus (PROBIOTIC ACIDOPHILUS PO) Take 1 capsule by mouth daily.     [name] loratadine (CLARITIN) 10 MG tablet Take 10 mg by mouth daily as needed for allergies.    . Magnesium 400 MG CAPS Take 400 mg by mouth daily.    . metoprolol succinate (TOPROL XL) 25 MG 24 hr tablet Take 0.5 tablets (12.5 mg total) by mouth daily. 45 tablet 3  . Multiple Vitamin (MULTIVITAMIN) capsule Take 1 capsule by mouth daily.    . mupirocin ointment (BACTROBAN) 2 % Apply to right leg wound daily - a small dab as needed once a day. 15 g 0  . nitroGLYCERIN (NITROSTAT) 0.4 MG SL tablet Place 1 tablet (0.4 mg total) under the tongue every 5 (five) minutes as needed for chest pain. 50 tablet 3  . Omega-3 Fatty Acids (OMEGA 3 PO) Take 520 mg by mouth daily.     . Pramoxine-HC (HYDROCORTISONE ACE-PRAMOXINE) 2.5-1 % CREA Apply topically as needed.     . Turmeric 450 MG CAPS Take 450 Units by mouth daily.     . vitamin B-12 (CYANOCOBALAMIN) 1000 MCG tablet  Take 1,000 mcg by mouth daily.     No current facility-administered medications on file prior to visit.    There are no Patient Instructions on file for this visit. No follow-ups on file.   Kris Hartmann, NP

## 2020-10-01 ENCOUNTER — Other Ambulatory Visit: Payer: Medicare Other

## 2020-10-07 ENCOUNTER — Telehealth: Payer: Self-pay

## 2020-10-07 ENCOUNTER — Ambulatory Visit
Admission: RE | Admit: 2020-10-07 | Discharge: 2020-10-07 | Disposition: A | Discharge status: Home / Self Care | Payer: Medicare Other | Source: Ambulatory Visit | Attending: Gastroenterology | Admitting: Gastroenterology

## 2020-10-07 ENCOUNTER — Other Ambulatory Visit: Payer: Self-pay

## 2020-10-07 DIAGNOSIS — R1319 Other dysphagia: Secondary | ICD-10-CM

## 2020-10-07 NOTE — Telephone Encounter (Signed)
-----   Message from Virgel Manifold, MD sent at 10/07/2020 11:56 AM EST ----- [name] please let the patient know, her results show trace aspiration with 2 swallows. Her symptoms are likely due to problems with her swallowing. I recommend referral to speech and swallow therapy for oropharyngeal dysphagia as they would work on how she is swallowing and improving it.

## 2020-10-07 NOTE — Telephone Encounter (Signed)
Called patient to let her know the below information. Patient understood and agreed with referral to speech and swallow therapy. I told patient to give them a week and if she did not hear from them, to call me back to check on her referral. Patient agreed and had no further questions.

## 2020-10-08 NOTE — Telephone Encounter (Signed)
Speech pathology office called wanting patient to have a barium swallow study. I then informed Dr. Bonna Gains and she agreed for me to order it. Therefore, I placed the order and scheduled it. Patient was contacted and she agreed to go and have it done.

## 2020-10-08 NOTE — Addendum Note (Signed)
Addended by: Wayna Chalet on: 10/08/2020 11:07 AM   Modules accepted: Orders

## 2020-10-09 ENCOUNTER — Other Ambulatory Visit: Payer: Self-pay | Admitting: Gastroenterology

## 2020-10-09 NOTE — Progress Notes (Signed)
m °

## 2020-10-13 ENCOUNTER — Ambulatory Visit
Admission: RE | Admit: 2020-10-13 | Discharge: 2020-10-13 | Disposition: A | Discharge status: Home / Self Care | Payer: Medicare Other | Source: Ambulatory Visit | Attending: Gastroenterology | Admitting: Gastroenterology

## 2020-10-13 ENCOUNTER — Other Ambulatory Visit: Payer: Self-pay | Admitting: Gastroenterology

## 2020-10-13 ENCOUNTER — Other Ambulatory Visit: Payer: Self-pay

## 2020-10-13 ENCOUNTER — Ambulatory Visit: Admission: RE | Admit: 2020-10-13 | Payer: Medicare Other | Source: Ambulatory Visit

## 2020-10-13 DIAGNOSIS — R1319 Other dysphagia: Secondary | ICD-10-CM | POA: Diagnosis not present

## 2020-10-13 DIAGNOSIS — R1314 Dysphagia, pharyngoesophageal phase: Secondary | ICD-10-CM | POA: Insufficient documentation

## 2020-10-13 NOTE — Therapy (Addendum)
[facility], [place], [address] Phone: [phone]   Fax:     Modified Barium Swallow  Patient Details  Name: [name] MRN: [id number] Date of Birth: 02/23/1927 No data recorded  Encounter Date: 10/13/2020   End of Session - 10/13/20 1544    Visit Number 1    Number of Visits 1    Date for SLP Re-Evaluation 10/13/20    SLP Start Time 53    SLP Stop Time  1330    SLP Time Calculation (min) 60 min    Activity Tolerance Patient tolerated treatment well           Past Medical History:  Diagnosis Date  . Allergy   . Arthritis   . Atherosclerosis   . Atherosclerosis of abdominal aorta ([place])   . Chronic kidney disease    stage 2  . CKD (chronic kidney disease) stage 2, GFR 60-89 ml/min   . Collagen vascular disease ([place])   . Diverticulosis 2008  . GERD (gastroesophageal reflux disease)   . Heart murmur   . Hyperlipidemia   . Lumbar spinal stenosis   . Osteoporosis   . Ovarian lump   . RA (rheumatoid arthritis) ([place])   . Raynaud's disease   . Rheumatoid arthritis ([place])   . Spinal stenosis     Past Surgical History:  Procedure Laterality Date  . BREAST SURGERY Left 1965   Benign biopsy  . CHOLECYSTECTOMY  2005   [place]  . COLONOSCOPY  2008   Dr. Donnella Sham  . EXCISION OF BREAST BIOPSY Left 1965   benign  . EYE SURGERY Bilateral    Cataract Extraction with IOL  . KYPHOPLASTY N/A 12/17/2016   Procedure: KYPHOPLASTY;  Surgeon: Hessie Knows, MD;  Location: [facility] ORS;  Service: Orthopedics;  Laterality: N/A;  l4  . LUMBAR LAMINECTOMY  05/09/2017   L2-L5  . VISCERAL ANGIOGRAPHY N/A 08/15/2017   Procedure: VISCERAL ANGIOGRAPHY;  Surgeon: Algernon Huxley, MD;  Location: [facility];  Service: Cardiovascular;  Laterality: N/A;  . VISCERAL ANGIOGRAPHY N/A 10/05/2017   Procedure: VISCERAL ANGIOGRAPHY;  Surgeon: Algernon Huxley, MD;  Location: [facility];  Service:  Cardiovascular;  Laterality: N/A;  . VISCERAL ARTERY INTERVENTION N/A 08/15/2017   Procedure: VISCERAL ARTERY INTERVENTION;  Surgeon: Algernon Huxley, MD;  Location: [facility];  Service: Cardiovascular;  Laterality: N/A;    There were no vitals filed for this visit.      Subjective: Patient behavior: (alertness, ability to follow instructions, etc.): pt was pleasant, talkative and engaged easily w/ SLP describing her swallowing: report of "getting choked occasionally" when drinking liquids, but not consistently. She stated she does not eat much meat, or tough foods. She endorsed cornbread "catches in my throat sometimes". Pt had a recent DG Esophagus(Barium study) on 10/07/2020 in which Trace aspiration observed w/ "minimal cough reflex" -- pt was in LPO position when drinking the thin liquids at the time.   Pt denied CVA, any recent pneumonia in 1+ years.  Native Dentition w/ Denture Partials; OM Exam: wlf; strong cough.  Chief complaint: dysphagia.    Objective:  Radiological Procedure: A videoflouroscopic evaluation of oral-preparatory, reflex initiation, and pharyngeal phases of the swallow was performed; as well as a screening of the upper esophageal phase.  I. POSTURE: upright  II. VIEW: lateral III. COMPENSATORY STRATEGIES: f/u, Dry swallow w/ bites, sips. Head forward position -- Not tilting head back  when swallowing IV. BOLUSES ADMINISTERED:  Thin Liquid: 6 trials - via Cup  Nectar-thick Liquid: 1 trial  Honey-thick Liquid: NT  Puree: 3 trials  Mechanical Soft: 2 trials V. RESULTS OF EVALUATION: A. ORAL PREPARATORY PHASE: (The lips, tongue, and velum are observed for strength and coordination)       **Overall Severity Rating: grossly WFL-Mild. Adequate oral prep then bolus management for mastication and timely A-P transfer of bolus material for swallow. Noted min oral residue around Denture Partials and in buccal area which then spilled into pharynx post initial  swallow. Full oral clearing post swallows.  B. SWALLOW INITIATION/REFLEX: (The reflex is normal if "triggered" by the time the bolus reached the base of the tongue)  **Overall Severity Rating: grossly WFL. With attention to task, and NOT tilting head back, pt exhibited grossly Timely pharyngeal swallow initiation at the level of the Valleculae. Epiglottic inversion full; tight airway closure during the swallow. When pt did tilt her head back slightly when swallowing, noted thin liquid trials spilled somewhat faster into the pharynx from the Valleculae to the Pyriform Sinuses; pt may have been taking sips min faster as well. Inconsistent, slight laryngeal Penetration noted x2.   C. PHARYNGEAL PHASE: (Pharyngeal function is normal if the bolus shows rapid, smooth, and continuous transit through the pharynx and there is no pharyngeal residue after the swallow)  **Overall Severity Rating: [facility]. Clearing of all bolus residue in the pharynx occurred consistently indicating adequate pharyngeal pressure and laryngeal excursion during the swallow.  D. LARYNGEAL PENETRATION: (Material entering into the laryngeal inlet/vestibule but not aspirated): inconsistent, slight laryngeal Penetration noted x2 when pt was drinking faster and slight head tilt occurring -- laryngeal penetration was NOT noted when pt attended to Single, Small sips Head Forward.  E. ASPIRATION: NONE  F. ESOPHAGEAL PHASE: (Screening of the upper esophagus): slow clearing of bolus material in the lower Cervical-upper Thoracic Esophagus viewable. Cervical spine in this area appeared "bumpy" w/ slight amount of bolus stasis noted x2.    ASSESSMENT: Pt appears to present w/ grossly functional oropharyngeal phase swallowing w/ no significant neurological deficits of swallowing noted. Pt is at Reduced risk for prandial aspiration when following general aspiration precautions and safe swallowing practices.   PLAN/RECOMMENDATIONS:  A. Diet: continue  age-appropriate diet w/ foods Cut Small, Moistened for ease of chewing; Thin liquids VIA CUP -- No Straws. Pills Whole in Puree for ease and safety of swallowing   B. Swallowing Precautions: Head Forward(No Tilting Back when swallowing); f/u, Dry swallow w/ each bite/sip; No Straws  C. Recommended consultation to: n/a  D. Therapy recommendations: n/a at this time -- continue to monitor pt's Pulmonary status for any change/decline; Education if needed on safe swallowing practices and precautions  E. Results and recommendations were discussed w/ pt, video viewed and questions answered. Handout given on recommendation of safe swallow practices and general aspiration precautions. Pt agreed.            Pharyngoesophageal dysphagia  Esophageal dysphagia - Plan: DG SWALLOW FUNC OP MEDICARE SPEECH PATH, DG SWALLOW FUNC OP MEDICARE SPEECH PATH        Problem List Patient Active Problem List   Diagnosis Date Noted  . History of hepatitis A 09/16/2020  . Leg pain, right 08/28/2020  . Need for immunization against influenza 08/28/2020  . Asymptomatic varicose veins of lower extremity, bilateral 08/28/2020  . Puncture wound of right lower leg 08/26/2020  . Elbow wound, left, sequela 05/28/2020  . Hospital discharge follow-up  04/05/2020  . Angina of effort ([place]) 03/26/2020  . Chest pain 03/25/2020  . Accelerated hypertension 03/25/2020  . Atypical chest pain 03/13/2020  . Elevated AST (SGOT) 10/11/2019  . Osteoporosis, postmenopausal 08/07/2019  . Chronic pain in right shoulder 12/06/2018  . Primary osteoarthritis of both hands 06/05/2018  . Pancreatic cyst 04/10/2018  . Myalgia due to statin 04/10/2018  . Raynaud's phenomenon without gangrene 04/04/2018  . Elevated rheumatoid factor 04/04/2018  . Positive ANA (antinuclear antibody) 04/04/2018  . Weakness 03/14/2018  . Varicose veins of leg with pain, right 09/13/2017  . History of lumbar laminectomy for spinal cord decompression  07/19/2017  . DNAR (do not attempt resuscitation) 04/19/2017  . Compression fracture of L4 lumbar vertebra 12/10/2016  . Celiac artery stenosis ([place]) 12/06/2016  . Abdominal pain 04/09/2016  . Preventative health care 01/03/2016  . Hyperglycemia 12/19/2015  . Compression fracture of thoracic spine, non-traumatic ([place]) 08/19/2015  . Chronic thoracic spine pain 08/15/2015  . Low back pain 08/15/2015  . Compression fracture of L2 lumbar vertebra ([place]) 08/15/2015  . Hyperlipidemia   . Osteoporosis   . Rheumatoid arthritis ([place])   . Atherosclerosis of abdominal aorta ([place])   . Lumbar spinal stenosis   . Chronic kidney disease, stage III (moderate) ([facility])   . CAD (coronary artery disease) 06/24/2014  . History of adenomatous polyp of colon 06/14/2014  . Amaurosis fugax of left eye 01/29/2013  . Pulmonary nodule seen on imaging study 07/13/2012  . Irritable bowel syndrome 02/16/2012    [name] 10/13/2020, 3:45 PM  [facility] [place], [place], [address] Phone: [phone]   Fax:     Name: [name] MRN: [id number] Date of Birth: October 31, 1927

## 2020-10-30 ENCOUNTER — Encounter: Payer: Medicare Other | Admitting: Physician Assistant

## 2020-10-30 ENCOUNTER — Other Ambulatory Visit: Payer: Self-pay

## 2020-10-30 NOTE — Progress Notes (Signed)
Renda, Pohlman Kimaya T. ([id number]) Visit Report for 10/30/2020 Allergy List Details Patient Name: [name], [name] Date of Service: 10/30/2020 9:15 AM Medical Record Number: [id number] Patient Account Number: [id number] Date of Birth/Sex: July 08, 1927 (86 y.o. F) Treating RN: Dolan Amen Primary Care Goldie Tregoning: Deborra Medina Other Clinician: Referring Mckinzee Spirito: Deborra Medina Treating Kylin Dubs/Extender: Jeri Cos Weeks in Treatment: 0 Allergies Active Allergies prednisone Reaction: rash Type: Food doxycycline Reaction: hives Type: Food Allergy Notes Electronic Signature(s) Signed: 10/30/2020 10:29:30 AM By: Georges Mouse, Minus Breeding RN Entered By: Georges Mouse, Minus Breeding on 10/30/2020 09:29:31 Mcconathy, Kassadie T. ([id number]) -------------------------------------------------------------------------------- Arrival Information Details Patient Name: Demetrius, Amneet T. Date of Service: 10/30/2020 9:15 AM Medical Record Number: [id number] Patient Account Number: [id number] Date of Birth/Sex: 05-16-1927 (86 y.o. F) Treating RN: Dolan Amen Primary Care Breylan Lefevers: Deborra Medina Other Clinician: Referring Anysia Choi: Deborra Medina Treating Iantha Titsworth/Extender: Skipper Cliche in Treatment: 0 Visit Information Patient Arrived: Ambulatory Arrival Time: 09:24 Accompanied By: self Transfer Assistance: Manual Patient Identification Verified: Yes Secondary Verification Process Completed: Yes Electronic Signature(s) Signed: 10/30/2020 10:29:30 AM By: Georges Mouse, Minus Breeding RN Entered By: Georges Mouse, Minus Breeding on 10/30/2020 09:24:24 Fonseca, Lockie T. ([id number]) -------------------------------------------------------------------------------- General Visit Notes Details Patient Name: Espy, Talitha T. Date of Service: 10/30/2020 9:15 AM Medical Record Number: [id number] Patient Account Number: [id number] Date of Birth/Sex: [birth year] (86 y.o. F) Treating RN: Dolan Amen Primary Care Solomiya Pascale:  Deborra Medina Other Clinician: Referring Doyt Castellana: Deborra Medina Treating Shamel Germond/Extender: Skipper Cliche in Treatment: 0 Notes Patient came in today with no open wounds. Jeri Cos, PA-C went in the room and reassured patient. Patient aware to contact us if areas reopen. Electronic Signature(s) Signed: 10/30/2020 9:51:51 AM By: Georges Mouse, Minus Breeding RN Previous Signature: 10/30/2020 9:50:52 AM Version By: Georges Mouse, Minus Breeding RN Entered By: Georges Mouse, Kenia on 10/30/2020 09:51:51 Coviello, Sharlene T. ([id number]) -------------------------------------------------------------------------------- Pain Assessment Details Patient Name: Swartout, Sumayya T. Date of Service: 10/30/2020 9:15 AM Medical Record Number: [id number] Patient Account Number: [id number] Date of Birth/Sex: October 31, 1927 (86 y.o. F) Treating RN: Dolan Amen Primary Care Taevyn Hausen: Deborra Medina Other Clinician: Referring Jawon Dipiero: Deborra Medina Treating Jesten Cappuccio/Extender: Skipper Cliche in Treatment: 0 Active Problems Location of Pain Severity and Description of Pain Patient Has Paino No Site Locations Rate the pain. Current Pain Level: 0 Pain Management and Medication Current Pain Management: Electronic Signature(s) Signed: 10/30/2020 10:29:30 AM By: Georges Mouse, Minus Breeding RN Entered By: Georges Mouse, Kenia on 10/30/2020 09:25:00 Schatzman, Ethelreda T. ([id number]) -------------------------------------------------------------------------------- Vitals Details Patient Name: Ostermiller, Nahia T. Date of Service: 10/30/2020 9:15 AM Medical Record Number: [id number] Patient Account Number: [id number] Date of Birth/Sex: 1927/01/20 (86 y.o. F) Treating RN: Dolan Amen Primary Care Trenace Coughlin: Deborra Medina Other Clinician: Referring Xane Amsden: Deborra Medina Treating Hiroshi Krummel/Extender: Skipper Cliche in Treatment: 0 Vital Signs Time Taken: 09:26 Temperature (F): 98.2 Height (in): 60 Pulse (bpm):  60 Source: Stated Respiratory Rate (breaths/min): 16 Weight (lbs): 101 Blood Pressure (mmHg): 135/73 Source: Stated Reference Range: 80 - 120 mg / dl Body Mass Index (BMI): 19.7 Electronic Signature(s) Signed: 10/30/2020 10:29:30 AM By: Georges Mouse, Minus Breeding RN Entered By: Georges Mouse, Minus Breeding on 10/30/2020 09:28:20

## 2020-10-31 ENCOUNTER — Other Ambulatory Visit: Payer: Self-pay | Admitting: Gastroenterology

## 2020-10-31 DIAGNOSIS — R1084 Generalized abdominal pain: Secondary | ICD-10-CM

## 2020-11-12 ENCOUNTER — Telehealth: Payer: Self-pay

## 2020-11-12 NOTE — Telephone Encounter (Signed)
Called patient but had to leave her a detailed message letting her know that after Dr. Bonna Gains reviewing her speech pathology recommendations, if she was still having symptoms, to please give [place] a call to set up a follow up appointment with Dr. Bonna Gains. I will also send her a MyChart message.

## 2020-11-12 NOTE — Telephone Encounter (Signed)
-----   Message from Virgel Manifold, MD sent at 11/11/2020  1:26 PM EST ----- Please let the patient know that I reviewed the speech pathologist's note.  After following their recommendations if she still continues to have symptoms, please make follow-up appointment with me

## 2020-11-14 ENCOUNTER — Telehealth: Payer: Self-pay | Admitting: Internal Medicine

## 2020-11-14 NOTE — Telephone Encounter (Signed)
"  Rejection Reason - Other - patient came in to be seen but didn't have a wound so the appt was cancelled so the patient wouldn't be charged" [place] said on Nov 14, 2020 10:43 AM

## 2020-12-10 ENCOUNTER — Encounter: Payer: Self-pay | Admitting: Gastroenterology

## 2020-12-10 ENCOUNTER — Ambulatory Visit (INDEPENDENT_AMBULATORY_CARE_PROVIDER_SITE_OTHER): Payer: Medicare Other | Admitting: Gastroenterology

## 2020-12-10 ENCOUNTER — Other Ambulatory Visit: Payer: Self-pay

## 2020-12-10 VITALS — BP 154/68 | HR 61 | Temp 97.3°F | Ht 60.0 in | Wt 99.8 lb

## 2020-12-10 DIAGNOSIS — R748 Abnormal levels of other serum enzymes: Secondary | ICD-10-CM | POA: Diagnosis not present

## 2020-12-10 DIAGNOSIS — R1084 Generalized abdominal pain: Secondary | ICD-10-CM

## 2020-12-10 DIAGNOSIS — K219 Gastro-esophageal reflux disease without esophagitis: Secondary | ICD-10-CM | POA: Diagnosis not present

## 2020-12-10 MED ORDER — FAMOTIDINE 20 MG PO TABS: 20.0000 mg | ORAL_TABLET | Freq: Two times a day (BID) | ORAL | 0 refills | Status: DC

## 2020-12-10 NOTE — Patient Instructions (Signed)
Costochondritis  Costochondritis is inflammation of the tissue (cartilage) that connects the ribs to the breastbone (sternum). This causes pain in the front of the chest. The pain usually starts slowly and involves more than one rib. What are the causes? The exact cause of this condition is not always known. It results from stress on the cartilage where your ribs attach to your sternum. The cause of this stress could be:  Chest injury.  Exercise or activity, such as lifting.  Severe coughing. What increases the risk? You are more likely to develop this condition if you:  Are female.  Are 60-5 years old.  Recently started a new exercise or work activity.  Have low levels of vitamin D.  Have a condition that makes you cough frequently. What are the signs or symptoms? The main symptom of this condition is chest pain. The pain:  Usually starts gradually and can be sharp or dull.  Gets worse with deep breathing, coughing, or exercise.  Gets better with rest.  May be worse when you press on the affected area of your ribs and sternum. How is this diagnosed? This condition is diagnosed based on your symptoms, your medical history, and a physical exam. Your health care provider will check for pain when pressing on your sternum. You may also have tests to rule out other causes of chest pain. These may include:  A chest X-ray to check for lung problems.  An ECG (electrocardiogram) to see if you have a heart problem that could be causing the pain.  An imaging scan to rule out a chest or rib fracture. How is this treated? This condition usually goes away on its own over time. Your health care provider may prescribe an NSAID, such as ibuprofen, to reduce pain and inflammation. Treatment may also include:  Resting and avoiding activities that make pain worse.  Applying heat or ice to the area to reduce pain and inflammation.  Doing exercises to stretch your chest muscles. If these  treatments do not help, your health care provider may inject a numbing medicine at the sternum-rib connection to help relieve the pain. Follow these instructions at home: Managing pain, stiffness, and swelling  If directed, put ice on the painful area. To do this: ? Put ice in a plastic bag. ? Place a towel between your skin and the bag. ? Leave the ice on for 20 minutes, 2-3 times a day.  If directed, apply heat to the affected area as often as told by your health care provider. Use the heat source that your health care provider recommends, such as a moist heat pack or a heating pad. ? Place a towel between your skin and the heat source. ? Leave the heat on for 20-30 minutes. ? Remove the heat if your skin turns bright red. This is especially important if you are unable to feel pain, heat, or cold. You may have a greater risk of getting burned.      Activity  Rest as told by your health care provider. Avoid activities that make pain worse. This includes any activities that use chest, abdominal, and side muscles.  Do not lift anything that is heavier than 10 lb (4.5 kg), or the limit that you are told, until your health care provider says that it is safe.  Return to your normal activities as told by your health care provider. Ask your health care provider what activities are safe for you. General instructions  Take over-the-counter and prescription medicines  only as told by your health care provider.  Keep all follow-up visits as told by your health care provider. This is important. Contact a health care provider if:  You have chills or a fever.  Your pain does not go away or it gets worse.  You have a cough that does not go away. Get help right away if:  You have shortness of breath.  You have severe chest pain that is not relieved by medicines, heat, or ice. These symptoms may represent a serious problem that is an emergency. Do not wait to see if the symptoms will go away.  Get medical help right away. Call your local emergency services (911 in the [place] Do not drive yourself to the hospital.  Summary  Costochondritis is inflammation of the tissue (cartilage) that connects the ribs to the breastbone (sternum).  This condition causes pain in the front of the chest.  Costochondritis results from stress on the cartilage where your ribs attach to your sternum.  Treatment may include medicines, rest, heat or ice, and exercises. This information is not intended to replace advice given to you by your health care provider. Make sure you discuss any questions you have with your health care provider. Document Revised: 09/07/2019 Document Reviewed: 09/07/2019 Elsevier Patient Education  2021 [place] for Gastroesophageal Reflux Disease, Adult When you have gastroesophageal reflux disease (GERD), the foods you eat and your eating habits are very important. Choosing the right foods can help ease your discomfort. Think about working with a food expert (dietitian) to help you make good choices. What are tips for following this plan? Reading food labels  Look for foods that are low in saturated fat. Foods that may help with your symptoms include: ? Foods that have less than 5% of daily value (DV) of fat. ? Foods that have 0 grams of trans fat. Cooking  Do not fry your food.  Cook your food by baking, steaming, grilling, or broiling. These are all methods that do not need a lot of fat for cooking.  To add flavor, try to use herbs that are low in spice and acidity. Meal planning  Choose healthy foods that are low in fat, such as: ? Fruits and vegetables. ? Whole grains. ? Low-fat dairy products. ? Lean meats, fish, and poultry.  Eat small meals often instead of eating 3 large meals each day. Eat your meals slowly in a place where you are relaxed. Avoid bending over or lying down until 2-3 hours after eating.  Limit high-fat foods such as fatty  meats or fried foods.  Limit your intake of fatty foods, such as oils, butter, and shortening.  Avoid the following as told by your doctor: ? Foods that cause symptoms. These may be different for different people. Keep a food diary to keep track of foods that cause symptoms. ? Alcohol. ? Drinking a lot of liquid with meals. ? Eating meals during the 2-3 hours before bed.   Lifestyle  Stay at a healthy weight. Ask your doctor what weight is healthy for you. If you need to lose weight, work with your doctor to do so safely.  Exercise for at least 30 minutes on 5 or more days each week, or as told by your doctor.  Wear loose-fitting clothes.  Do not smoke or use any products that contain nicotine or tobacco. If you need help quitting, ask your doctor.  Sleep with the head of your bed higher than your feet. Use  a wedge under the mattress or blocks under the bed frame to raise the head of the bed.  Chew sugar-free gum after meals. What foods should eat? Eat a healthy, well-balanced diet of fruits, vegetables, whole grains, low-fat dairy products, lean meats, fish, and poultry. Each person is different. Foods that may cause symptoms in one person may not cause any symptoms in another person. Work with your doctor to find foods that are safe for you. The items listed above may not be a complete list of what you can eat and drink. Contact a food expert for more options.   What foods should I avoid? Limiting some of these foods may help in managing the symptoms of GERD. Everyone is different. Talk with a food expert or your doctor to help you find the exact foods to avoid, if any. Fruits Any fruits prepared with added fat. Any fruits that cause symptoms. For some people, this may include citrus fruits, such as oranges, grapefruit, pineapple, and lemons. Vegetables Deep-fried vegetables. [place] fries. Any vegetables prepared with added fat. Any vegetables that cause symptoms. For some people,  this may include tomatoes and tomato products, chili peppers, onions and garlic, and horseradish. Grains Pastries or quick breads with added fat. Meats and other proteins High-fat meats, such as fatty beef or pork, hot dogs, ribs, ham, sausage, salami, and bacon. Fried meat or protein, including fried fish and fried chicken. Nuts and nut butters, in large amounts. Dairy Whole milk and chocolate milk. Sour cream. Cream. Ice cream. Cream cheese. Milkshakes. Fats and oils Butter. Margarine. Shortening. Ghee. Beverages Coffee and tea, with or without caffeine. Carbonated beverages. Sodas. Energy drinks. Fruit juice made with acidic fruits, such as orange or grapefruit. Tomato juice. Alcoholic drinks. Sweets and desserts Chocolate and cocoa. Donuts. Seasonings and condiments Pepper. Peppermint and spearmint. Added salt. Any condiments, herbs, or seasonings that cause symptoms. For some people, this may include curry, hot sauce, or vinegar-based salad dressings. The items listed above may not be a complete list of what you should not eat and drink. Contact a food expert for more options. Questions to ask your doctor Diet and lifestyle changes are often the first steps that are taken to manage symptoms of GERD. If diet and lifestyle changes do not help, talk with your doctor about taking medicines. Where to find more information  International Foundation for Gastrointestinal Disorders: aboutgerd.org Summary  When you have GERD, food and lifestyle choices are very important in easing your symptoms.  Eat small meals often instead of 3 large meals a day. Eat your meals slowly and in a place where you are relaxed.  Avoid bending over or lying down until 2-3 hours after eating.  Limit high-fat foods such as fatty meats or fried foods. This information is not intended to replace advice given to you by your health care provider. Make sure you discuss any questions you have with your health care  provider. Document Revised: 05/05/2020 Document Reviewed: 05/05/2020 Elsevier Patient Education  [place].

## 2020-12-11 NOTE — Progress Notes (Signed)
Brandy Antigua, MD [address]  [place]  [place], [place] [address]  Main: [phone]  Fax: [phone]   Primary Care Physician: Crecencio Mc, MD   Chief Complaint  Patient presents with  . esophageal dysphagia    HPI: [name] is a 87 y.o. female here for follow-up of dysphagia.  Patient was evaluated by speech pathology and since seeing them, she states she has instituted measures recommended by them and her dysphagia has significantly improved.  In fact, she denies any issues with swallowing liquids or swallows.  No weight loss.  No nausea or vomiting.  Esophagram did not show any concerning lesions in the esophagus.  She does report some symptoms with heartburn/indigestion and states these were better when she was taking her medications for H. pylori treatment  Patient also has chronically elevated AST.  Work-up for this has only revealed positive ANA and that patient is immune to hepatitis A.  As per notes from PCP under that ANA result, patient has seen rheumatology for this already.  Current Outpatient Medications  Medication Sig Dispense Refill  . acetaminophen (TYLENOL) 500 MG tablet Take 500 mg by mouth every 6 (six) hours as needed for mild pain.    . Alfalfa 500 MG TABS Take 500 mg by mouth daily.     . Ascorbic Acid (VITAMIN C CR) 500 MG CPCR Take 500 mg by mouth daily.    [name] aspirin EC 81 MG EC tablet Take 1 tablet (81 mg total) by mouth every evening. 120 tablet 0  . atorvastatin (LIPITOR) 10 MG tablet Take 1 tablet (10 mg total) by mouth every evening. 30 tablet 6  . Calcium Carbonate (CALCIUM 600 PO) Take by mouth.    . Cholecalciferol (VITAMIN D3) 3000 UNITS TABS Take 3,000 Units by mouth 2 (two) times daily.     [name] CINNAMON PO Take 1,000 mg by mouth daily.     . clotrimazole-betamethasone (LOTRISONE) cream Apply topically 2 (two) times daily. (Patient taking differently: Apply topically 2 (two) times daily as needed.) 30 g 1  .  diclofenac sodium (VOLTAREN) 1 % GEL Apply topically as needed.     . famotidine (PEPCID) 20 MG tablet Take 1 tablet (20 mg total) by mouth 2 (two) times daily. 30 tablet 0  . fluticasone (FLONASE) 50 MCG/ACT nasal spray Place 1 spray into both nostrils daily.    . Ginkgo Biloba (GNP GINGKO BILOBA EXTRACT PO) Take 1 tablet by mouth daily.    . Glucosamine Sulfate 500 MG CAPS Take 500 mg by mouth daily.    . hydrALAZINE (APRESOLINE) 10 MG tablet Take 1 tablet (10 mg total) by mouth 3 (three) times daily. As needed for sbp  170 or higher 30 tablet 0  . isosorbide mononitrate (IMDUR) 30 MG 24 hr tablet Take 0.5 tablets (15 mg total) by mouth daily. 45 tablet 3  . Lactobacillus (PROBIOTIC ACIDOPHILUS PO) Take 1 capsule by mouth daily.     [name] loratadine (CLARITIN) 10 MG tablet Take 10 mg by mouth daily as needed for allergies.    . Magnesium 400 MG CAPS Take 400 mg by mouth daily.    . metoprolol succinate (TOPROL XL) 25 MG 24 hr tablet Take 0.5 tablets (12.5 mg total) by mouth daily. 45 tablet 3  . Multiple Vitamin (MULTIVITAMIN) capsule Take 1 capsule by mouth daily.    . mupirocin ointment (BACTROBAN) 2 % Apply to right leg wound daily - a small dab as  needed once a day. 15 g 0  . nitroGLYCERIN (NITROSTAT) 0.4 MG SL tablet Place 1 tablet (0.4 mg total) under the tongue every 5 (five) minutes as needed for chest pain. 50 tablet 3  . Omega-3 Fatty Acids (OMEGA 3 PO) Take 520 mg by mouth daily.     . Pramoxine-HC (HYDROCORTISONE ACE-PRAMOXINE) 2.5-1 % CREA Apply topically as needed.     . Turmeric 450 MG CAPS Take 450 Units by mouth daily.     . vitamin B-12 (CYANOCOBALAMIN) 1000 MCG tablet Take 1,000 mcg by mouth daily.     No current facility-administered medications for this visit.    Allergies as of 12/10/2020 - Review Complete 12/10/2020  Allergen Reaction Noted  . Ibandronic acid Other (See Comments) 08/14/2015  . Other Other (See Comments) 01/19/2012  . Prednisone Rash 12/26/2014  .  Actonel [risedronate] Other (See Comments) 06/19/2014  . Amoxicillin Hives 04/15/2020  . Pantoprazole Other (See Comments) 08/11/2020  . Raloxifene Other (See Comments) 08/14/2015  . Versed [midazolam] Other (See Comments) 12/16/2016  . Doxycycline Rash 12/26/2014    ROS:  General: Negative for anorexia, weight loss, fever, chills, fatigue, weakness. ENT: Negative for hoarseness, difficulty swallowing , nasal congestion. CV: Negative for chest pain, angina, palpitations, dyspnea on exertion, peripheral edema.  Respiratory: Negative for dyspnea at rest, dyspnea on exertion, cough, sputum, wheezing.  GI: See history of present illness. GU:  Negative for dysuria, hematuria, urinary incontinence, urinary frequency, nocturnal urination.  Endo: Negative for unusual weight change.    Physical Examination:   BP (!) 154/68   Pulse 61   Temp (!) 97.3 F (36.3 C) (Oral)   Ht 5' (1.524 m)   Wt 99 lb 12.8 oz (45.3 kg)   BMI 19.49 kg/m   General: Well-nourished, well-developed in no acute distress.  Eyes: No icterus. Conjunctivae pink. Mouth: Oropharyngeal mucosa moist and pink , no lesions erythema or exudate. Neck: Supple, Trachea midline Abdomen: Bowel sounds are normal, nontender, nondistended, no hepatosplenomegaly or masses, no abdominal bruits or hernia , no rebound or guarding.   Extremities: No lower extremity edema. No clubbing or deformities. Neuro: Alert and oriented x 3.  Grossly intact. Skin: Warm and dry, no jaundice.   Psych: Alert and cooperative, normal mood and affect.   Labs: CMP     Component Value Date/Time   NA 138 05/28/2020 1237   NA 143 12/19/2015 1422   K 4.1 05/28/2020 1237   CL 102 05/28/2020 1237   CO2 30 05/28/2020 1237   GLUCOSE 90 05/28/2020 1237   BUN 12 05/28/2020 1237   BUN 15 12/19/2015 1422   CREATININE 0.68 05/28/2020 1237   CREATININE 0.62 01/26/2019 1542   CALCIUM 9.3 05/28/2020 1237   PROT 7.2 05/28/2020 1237   PROT 6.2 12/19/2015  1422   ALBUMIN 4.3 05/28/2020 1237   ALBUMIN 4.0 12/19/2015 1422   AST 39 (H) 05/28/2020 1237   ALT 19 05/28/2020 1237   ALKPHOS 58 05/28/2020 1237   BILITOT 0.5 05/28/2020 1237   BILITOT <0.2 12/19/2015 1422   GFRNONAA >60 05/22/2020 1536   GFRNONAA 80 01/26/2019 1542   GFRAA >60 05/22/2020 1536   GFRAA 93 01/26/2019 1542   Lab Results  Component Value Date   WBC 5.9 05/22/2020   HGB 13.1 05/22/2020   HCT 40.0 05/22/2020   MCV 88.5 05/22/2020   PLT 212 05/22/2020    Imaging Studies: No results found.  Assessment and Plan:   Clessie Karras is a 87  y.o. y/o female here for follow-up of dysphagia  Symptoms resolved after visit with speech pathology and their recommendations If symptoms return patient was advised to let us know and she verbalized understanding  Patient was also treated for H. pylori and eradication testing was negative  Patient is reporting reflux symptoms, start low-dose H2 RA  Patient educated extensively on acid reflux lifestyle modification, including buying a bed wedge, not eating 3 hrs before bedtime, diet modifications, and handout given for the same.   Check anti-smooth muscle antibody as I do not see this was done in the past Also check right upper quadrant ultrasound.  CT abdomen pelvis in 2020 showed an unremarkable liver  Dr [name]

## 2020-12-22 ENCOUNTER — Ambulatory Visit (INDEPENDENT_AMBULATORY_CARE_PROVIDER_SITE_OTHER): Payer: Medicare Other | Admitting: Nurse Practitioner

## 2020-12-22 ENCOUNTER — Other Ambulatory Visit: Payer: Self-pay

## 2020-12-22 ENCOUNTER — Encounter (INDEPENDENT_AMBULATORY_CARE_PROVIDER_SITE_OTHER): Payer: Self-pay | Admitting: Nurse Practitioner

## 2020-12-22 VITALS — BP 156/67 | HR 58 | Resp 15 | Wt 103.0 lb

## 2020-12-22 DIAGNOSIS — E782 Mixed hyperlipidemia: Secondary | ICD-10-CM

## 2020-12-22 DIAGNOSIS — I771 Stricture of artery: Secondary | ICD-10-CM

## 2020-12-22 DIAGNOSIS — I83811 Varicose veins of right lower extremities with pain: Secondary | ICD-10-CM | POA: Diagnosis not present

## 2020-12-22 DIAGNOSIS — I774 Celiac artery compression syndrome: Secondary | ICD-10-CM

## 2020-12-22 DIAGNOSIS — S81831D Puncture wound without foreign body, right lower leg, subsequent encounter: Secondary | ICD-10-CM | POA: Diagnosis not present

## 2020-12-22 DIAGNOSIS — I10 Essential (primary) hypertension: Secondary | ICD-10-CM | POA: Diagnosis not present

## 2020-12-30 ENCOUNTER — Telehealth: Payer: Self-pay

## 2020-12-30 ENCOUNTER — Encounter (INDEPENDENT_AMBULATORY_CARE_PROVIDER_SITE_OTHER): Payer: Self-pay | Admitting: Nurse Practitioner

## 2020-12-30 DIAGNOSIS — R748 Abnormal levels of other serum enzymes: Secondary | ICD-10-CM

## 2020-12-30 NOTE — Telephone Encounter (Signed)
-----   Message from Virgel Manifold, MD sent at 12/11/2020 12:47 PM EST ----- Herb Grays, I ordered a new lab for this patient, please ask her to get this done.  I would also recommend right upper quadrant ultrasound.  These are for elevated liver enzymes

## 2020-12-30 NOTE — Progress Notes (Signed)
Subjective:    Patient ID: [name], female    DOB: [birth year], 87 y.o.   MRN: [id number] Chief Complaint  Patient presents with  . Follow-up    3 month follow up    Ms. Kurihara is a 87 year old female that presents today for follow-up in regards to a lower extremity wound.  Today the wound is completely healed.  The wound resulted from a puncture following doing some yard work.  This wound was located over several large varicosities.  The patient had Unna wraps for several weeks which she tolerated well.  Today the wound is completely healed with no previous evidence of the wound.  The patient's biggest complaint however is regarding abdominal discomfort.  She describes tightness around her midsection.  She notes that this is not always associated with meals.  She denies any nausea or vomiting.  This discomfort does happen mostly when she lays down or in the evening time.  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   Review of Systems  Gastrointestinal: Positive for abdominal pain.  Skin: Negative for wound.       Objective:   Physical Exam Vitals reviewed.  HENT:     Head: Normocephalic.  Cardiovascular:     Rate and Rhythm: Normal rate.     Pulses: Normal pulses.  Pulmonary:     Effort: Pulmonary effort is normal.  Skin:    General: Skin is warm and dry.  Neurological:     Mental Status: She is alert and oriented to person, place, and time.  Psychiatric:        Mood and Affect: Mood normal.        Behavior: Behavior normal.        Thought Content: Thought content normal.        Judgment: Judgment normal.     BP (!) 156/67 (BP Location: Left Arm)   Pulse (!) 58   Resp 15   Wt 103 lb (46.7 kg)   BMI 20.12 kg/m   Past Medical History:  Diagnosis Date  . Allergy   . Arthritis   . Atherosclerosis   . Atherosclerosis of abdominal aorta ([place])   . Chronic kidney disease    stage 2  . CKD (chronic kidney disease) stage  2, GFR 60-89 ml/min   . Collagen vascular disease ([place])   . Diverticulosis 2008  . GERD (gastroesophageal reflux disease)   . Heart murmur   . Hyperlipidemia   . Lumbar spinal stenosis   . Osteoporosis   . Ovarian lump   . RA (rheumatoid arthritis) ([place])   . Raynaud's disease   . Rheumatoid arthritis ([place])   . Spinal stenosis     Social History   Socioeconomic History  . Marital status: Widowed    Spouse name: Gwyndolyn Saxon  . Number of children: 3  . Years of education: some college  . Highest education level: 12th grade  Occupational History    Employer: RETIRED  Tobacco Use  . Smoking status: Never Smoker  . Smokeless tobacco: Never Used  . Tobacco comment: smoking cessation materials not required  Vaping Use  . Vaping Use: Never used  Substance and Sexual Activity  . Alcohol use: No    Alcohol/week: 0.0 standard drinks  . Drug use: No  . Sexual activity: Not Currently  Other Topics Concern  . Not on file  Social History Narrative  . Not on file   Social Determinants of Health  Financial Resource Strain: Not on file  Food Insecurity: Not on file  Transportation Needs: Not on file  Physical Activity: Not on file  Stress: Not on file  Social Connections: Not on file  Intimate Partner Violence: Not on file    Past Surgical History:  Procedure Laterality Date  . BREAST SURGERY Left 1965   Benign biopsy  . CHOLECYSTECTOMY  2005   [place]  . COLONOSCOPY  2008   Dr. Donnella Sham  . EXCISION OF BREAST BIOPSY Left 1965   benign  . EYE SURGERY Bilateral    Cataract Extraction with IOL  . KYPHOPLASTY N/A 12/17/2016   Procedure: KYPHOPLASTY;  Surgeon: Hessie Knows, MD;  Location: [facility];  Service: Orthopedics;  Laterality: N/A;  l4  . LUMBAR LAMINECTOMY  05/09/2017   L2-L5  . VISCERAL ANGIOGRAPHY N/A 08/15/2017   Procedure: VISCERAL ANGIOGRAPHY;  Surgeon: Algernon Huxley, MD;  Location: [facility];  Service: Cardiovascular;  Laterality: N/A;  . VISCERAL  ANGIOGRAPHY N/A 10/05/2017   Procedure: VISCERAL ANGIOGRAPHY;  Surgeon: Algernon Huxley, MD;  Location: [facility];  Service: Cardiovascular;  Laterality: N/A;  . VISCERAL ARTERY INTERVENTION N/A 08/15/2017   Procedure: VISCERAL ARTERY INTERVENTION;  Surgeon: Algernon Huxley, MD;  Location: [place] [facility];  Service: Cardiovascular;  Laterality: N/A;    Family History  Problem Relation Age of Onset  . Heart disease Mother   . Hypertension Father   . Heart disease Brother 65       heart attack  . Cancer Brother        bladder  . Diabetes Sister   . Alcohol abuse Sister   . Kidney disease Sister   . Diabetes Brother   . Stroke Brother   . Diabetes Maternal Grandmother   . Heart disease Maternal Grandmother   . Cancer Brother        lung    Allergies  Allergen Reactions  . Ibandronic Acid Other (See Comments)    Achy all over. Flu like S/S  . Other Other (See Comments)    Achy all over. Flu like S/S Dysphagia  . Prednisone Rash  . Actonel [Risedronate] Other (See Comments)    Dysphagia  . Amoxicillin Hives  . Pantoprazole Other (See Comments)    Little blistery bumps  . Raloxifene Other (See Comments)    Mood swings  . Versed [Midazolam] Other (See Comments)    Difficult waking up and memory loss  . Doxycycline Rash    CBC Latest Ref Rng & Units 05/22/2020 03/27/2020 03/26/2020  WBC 4.0 - 10.5 K/uL 5.9 5.2 5.1  Hemoglobin 12.0 - 15.0 g/dL 13.1 14.0 14.3  Hematocrit 36.0 - 46.0 % 40.0 42.1 43.9  Platelets 150 - 400 K/uL 212 189 194      CMP     Component Value Date/Time   NA 138 05/28/2020 1237   NA 143 12/19/2015 1422   K 4.1 05/28/2020 1237   CL 102 05/28/2020 1237   CO2 30 05/28/2020 1237   GLUCOSE 90 05/28/2020 1237   BUN 12 05/28/2020 1237   BUN 15 12/19/2015 1422   CREATININE 0.68 05/28/2020 1237   CREATININE 0.62 01/26/2019 1542   CALCIUM 9.3 05/28/2020 1237   PROT 7.2 05/28/2020 1237   PROT 6.2 12/19/2015 1422   ALBUMIN 4.3 05/28/2020  1237   ALBUMIN 4.0 12/19/2015 1422   AST 39 (H) 05/28/2020 1237   ALT 19 05/28/2020 1237   ALKPHOS 58 05/28/2020 1237  BILITOT 0.5 05/28/2020 1237   BILITOT <0.2 12/19/2015 1422   GFRNONAA >60 05/22/2020 1536   GFRNONAA 80 01/26/2019 1542   GFRAA >60 05/22/2020 1536   GFRAA 93 01/26/2019 1542     No results found.     Assessment & Plan:   1. Puncture wound of right lower leg, subsequent encounter This has healed.  No further intervention necessary.  2. Varicose veins of leg with pain, right Patient is relatively asymptomatic in regards to her varicosities.  Patient does relatively well with medical grade 1 compression stockings.  Patient is advised to continue with this and elevate lower extremities when possible.  3. Essential hypertension Continue antihypertensive medications as already ordered, these medications have been reviewed and there are no changes at this time.   4. Celiac artery stenosis (HCC) The patient does have some abdominal pain and tightness during the midsection.  The patient has some known celiac artery stenosis.  We will bring the patient in for sooner follow-up to evaluate if this pain may be related to her celiac artery stenosis.  5. Mixed hyperlipidemia Continue statin as ordered and reviewed, no changes at this time    Current Outpatient Medications on File Prior to Visit  Medication Sig Dispense Refill  . acetaminophen (TYLENOL) 500 MG tablet Take 500 mg by mouth every 6 (six) hours as needed for mild pain.    . Alfalfa 500 MG TABS Take 500 mg by mouth daily.     . Ascorbic Acid (VITAMIN C CR) 500 MG CPCR Take 500 mg by mouth daily.    [name] aspirin EC 81 MG EC tablet Take 1 tablet (81 mg total) by mouth every evening. 120 tablet 0  . atorvastatin (LIPITOR) 10 MG tablet Take 1 tablet (10 mg total) by mouth every evening. 30 tablet 6  . Calcium Carbonate (CALCIUM 600 PO) Take by mouth.    . Cholecalciferol (VITAMIN D3) 3000 UNITS TABS Take 3,000  Units by mouth 2 (two) times daily.     [name] CINNAMON PO Take 1,000 mg by mouth daily.     . clotrimazole-betamethasone (LOTRISONE) cream Apply topically 2 (two) times daily. (Patient taking differently: Apply topically 2 (two) times daily as needed.) 30 g 1  . diclofenac sodium (VOLTAREN) 1 % GEL Apply topically as needed.     . famotidine (PEPCID) 20 MG tablet Take 1 tablet (20 mg total) by mouth 2 (two) times daily. 30 tablet 0  . fluticasone (FLONASE) 50 MCG/ACT nasal spray Place 1 spray into both nostrils daily.    . Ginkgo Biloba (GNP GINGKO BILOBA EXTRACT PO) Take 1 tablet by mouth daily.    . Glucosamine Sulfate 500 MG CAPS Take 500 mg by mouth daily.    . hydrALAZINE (APRESOLINE) 10 MG tablet Take 1 tablet (10 mg total) by mouth 3 (three) times daily. As needed for sbp  170 or higher 30 tablet 0  . isosorbide mononitrate (IMDUR) 30 MG 24 hr tablet Take 0.5 tablets (15 mg total) by mouth daily. 45 tablet 3  . Lactobacillus (PROBIOTIC ACIDOPHILUS PO) Take 1 capsule by mouth daily.     [name] loratadine (CLARITIN) 10 MG tablet Take 10 mg by mouth daily as needed for allergies.    . Magnesium 400 MG CAPS Take 400 mg by mouth daily.    . metoprolol succinate (TOPROL XL) 25 MG 24 hr tablet Take 0.5 tablets (12.5 mg total) by mouth daily. 45 tablet 3  . Multiple Vitamin (MULTIVITAMIN) capsule Take 1  capsule by mouth daily.    . mupirocin ointment (BACTROBAN) 2 % Apply to right leg wound daily - a small dab as needed once a day. 15 g 0  . nitroGLYCERIN (NITROSTAT) 0.4 MG SL tablet Place 1 tablet (0.4 mg total) under the tongue every 5 (five) minutes as needed for chest pain. 50 tablet 3  . Omega-3 Fatty Acids (OMEGA 3 PO) Take 520 mg by mouth daily.     . Pramoxine-HC (HYDROCORTISONE ACE-PRAMOXINE) 2.5-1 % CREA Apply topically as needed.     . Turmeric 450 MG CAPS Take 450 Units by mouth daily.     . vitamin B-12 (CYANOCOBALAMIN) 1000 MCG tablet Take 1,000 mcg by mouth daily.     No current  facility-administered medications on file prior to visit.    There are no Patient Instructions on file for this visit. No follow-ups on file.   Kris Hartmann, NP

## 2020-12-30 NOTE — Telephone Encounter (Signed)
Order RUQ ultrasound. Called and left a message for call back

## 2021-01-02 ENCOUNTER — Encounter (INDEPENDENT_AMBULATORY_CARE_PROVIDER_SITE_OTHER): Payer: Self-pay | Admitting: Nurse Practitioner

## 2021-01-02 ENCOUNTER — Ambulatory Visit (INDEPENDENT_AMBULATORY_CARE_PROVIDER_SITE_OTHER): Payer: Medicare Other | Admitting: Nurse Practitioner

## 2021-01-02 ENCOUNTER — Ambulatory Visit (INDEPENDENT_AMBULATORY_CARE_PROVIDER_SITE_OTHER): Payer: Medicare Other

## 2021-01-02 ENCOUNTER — Other Ambulatory Visit: Payer: Self-pay

## 2021-01-02 VITALS — BP 180/80 | HR 56 | Resp 16 | Wt 101.4 lb

## 2021-01-02 DIAGNOSIS — I774 Celiac artery compression syndrome: Secondary | ICD-10-CM | POA: Diagnosis not present

## 2021-01-02 DIAGNOSIS — E782 Mixed hyperlipidemia: Secondary | ICD-10-CM

## 2021-01-02 DIAGNOSIS — I771 Stricture of artery: Secondary | ICD-10-CM

## 2021-01-03 ENCOUNTER — Encounter (INDEPENDENT_AMBULATORY_CARE_PROVIDER_SITE_OTHER): Payer: Self-pay | Admitting: Nurse Practitioner

## 2021-01-03 NOTE — Progress Notes (Signed)
Subjective:    Patient ID: [name], female    DOB: 1927/10/22, 87 y.o.   MRN: [id number] Chief Complaint  Patient presents with  . Mesenteric Insufficiency    1 yr follow up    Patient presents today for follow-up in regards to abdominal discomfort.  The patient notes that she has pain in her epigastric region.  This pain occurs mostly when she lies down at night.  In reviewing previous office notes, we saw the patient for this previously in July 2021.  The patient notes the pain has been ongoing for approximately 2 years.  The patient had celiac artery stents placed in 2018.  The patient notes that the pains do not occur with eating.  In fact she notes that she has no difficulty with eating at all.  She denies any food phobia.  She denies any nausea, vomiting or diarrhea.  The only issues are some shortness of breath which the patient plans to follow-up with her PCP for evaluation.  Today the patient has normal superior mesenteric artery, splenic artery hepatic artery findings.  The celiac artery appears to be widely patent.  It is noted that there is a 70 to 99% stenosis in the celiac artery.  In comparing the velocities, the velocities are actually somewhat decreased from the previous studies in July.  In reviewing previous studies going back to 2019 these have been consistently lower.  No evidence of an abdominal aortic aneurysm.     Review of Systems  Gastrointestinal: Positive for abdominal pain.  All other systems reviewed and are negative.      Objective:   Physical Exam Vitals reviewed.  HENT:     Head: Normocephalic.  Cardiovascular:     Rate and Rhythm: Normal rate.     Pulses: Normal pulses.  Pulmonary:     Effort: Pulmonary effort is normal.  Abdominal:     General: Abdomen is flat.     Palpations: Abdomen is soft.  Neurological:     Mental Status: She is alert and oriented to person, place, and time.  Psychiatric:        Mood and Affect: Mood normal.         Behavior: Behavior normal.        Thought Content: Thought content normal.        Judgment: Judgment normal.     BP (!) 180/80 (BP Location: Left Arm)   Pulse (!) 56   Resp 16   Wt 101 lb 6.4 oz (46 kg)   BMI 19.80 kg/m   Past Medical History:  Diagnosis Date  . Allergy   . Arthritis   . Atherosclerosis   . Atherosclerosis of abdominal aorta ([place])   . Chronic kidney disease    stage 2  . CKD (chronic kidney disease) stage 2, GFR 60-89 ml/min   . Collagen vascular disease ([place])   . Diverticulosis 2008  . GERD (gastroesophageal reflux disease)   . Heart murmur   . Hyperlipidemia   . Lumbar spinal stenosis   . Osteoporosis   . Ovarian lump   . RA (rheumatoid arthritis) ([place])   . Raynaud's disease   . Rheumatoid arthritis ([place])   . Spinal stenosis     Social History   Socioeconomic History  . Marital status: Widowed    Spouse name: Gwyndolyn Saxon  . Number of children: 3  . Years of education: some college  . Highest education level: 12th grade  Occupational History  Employer: RETIRED  Tobacco Use  . Smoking status: Never Smoker  . Smokeless tobacco: Never Used  . Tobacco comment: smoking cessation materials not required  Vaping Use  . Vaping Use: Never used  Substance and Sexual Activity  . Alcohol use: No    Alcohol/week: 0.0 standard drinks  . Drug use: No  . Sexual activity: Not Currently  Other Topics Concern  . Not on file  Social History Narrative  . Not on file   Social Determinants of Health   Financial Resource Strain: Not on file  Food Insecurity: Not on file  Transportation Needs: Not on file  Physical Activity: Not on file  Stress: Not on file  Social Connections: Not on file  Intimate Partner Violence: Not on file    Past Surgical History:  Procedure Laterality Date  . BREAST SURGERY Left 1965   Benign biopsy  . CHOLECYSTECTOMY  2005   [place]  . COLONOSCOPY  2008   Dr. Donnella Sham  . EXCISION OF BREAST BIOPSY Left 1965    benign  . EYE SURGERY Bilateral    Cataract Extraction with IOL  . KYPHOPLASTY N/A 12/17/2016   Procedure: KYPHOPLASTY;  Surgeon: Hessie Knows, MD;  Location: [facility];  Service: Orthopedics;  Laterality: N/A;  l4  . LUMBAR LAMINECTOMY  05/09/2017   L2-L5  . VISCERAL ANGIOGRAPHY N/A 08/15/2017   Procedure: VISCERAL ANGIOGRAPHY;  Surgeon: Algernon Huxley, MD;  Location: [place] [facility];  Service: Cardiovascular;  Laterality: N/A;  . VISCERAL ANGIOGRAPHY N/A 10/05/2017   Procedure: VISCERAL ANGIOGRAPHY;  Surgeon: Algernon Huxley, MD;  Location: [place] [facility];  Service: Cardiovascular;  Laterality: N/A;  . VISCERAL ARTERY INTERVENTION N/A 08/15/2017   Procedure: VISCERAL ARTERY INTERVENTION;  Surgeon: Algernon Huxley, MD;  Location: [place] [facility];  Service: Cardiovascular;  Laterality: N/A;    Family History  Problem Relation Age of Onset  . Heart disease Mother   . Hypertension Father   . Heart disease Brother 50       heart attack  . Cancer Brother        bladder  . Diabetes Sister   . Alcohol abuse Sister   . Kidney disease Sister   . Diabetes Brother   . Stroke Brother   . Diabetes Maternal Grandmother   . Heart disease Maternal Grandmother   . Cancer Brother        lung    Allergies  Allergen Reactions  . Ibandronic Acid Other (See Comments)    Achy all over. Flu like S/S  . Other Other (See Comments)    Achy all over. Flu like S/S Dysphagia  . Prednisone Rash  . Actonel [Risedronate] Other (See Comments)    Dysphagia  . Amoxicillin Hives  . Pantoprazole Other (See Comments)    Little blistery bumps  . Raloxifene Other (See Comments)    Mood swings  . Versed [Midazolam] Other (See Comments)    Difficult waking up and memory loss  . Doxycycline Rash    CBC Latest Ref Rng & Units 05/22/2020 03/27/2020 03/26/2020  WBC 4.0 - 10.5 K/uL 5.9 5.2 5.1  Hemoglobin 12.0 - 15.0 g/dL 13.1 14.0 14.3  Hematocrit 36.0 - 46.0 % 40.0 42.1 43.9  Platelets 150 - 400 K/uL  212 189 194      CMP     Component Value Date/Time   NA 138 05/28/2020 1237   NA 143 12/19/2015 1422   K 4.1 05/28/2020 1237   CL  102 05/28/2020 1237   CO2 30 05/28/2020 1237   GLUCOSE 90 05/28/2020 1237   BUN 12 05/28/2020 1237   BUN 15 12/19/2015 1422   CREATININE 0.68 05/28/2020 1237   CREATININE 0.62 01/26/2019 1542   CALCIUM 9.3 05/28/2020 1237   PROT 7.2 05/28/2020 1237   PROT 6.2 12/19/2015 1422   ALBUMIN 4.3 05/28/2020 1237   ALBUMIN 4.0 12/19/2015 1422   AST 39 (H) 05/28/2020 1237   ALT 19 05/28/2020 1237   ALKPHOS 58 05/28/2020 1237   BILITOT 0.5 05/28/2020 1237   BILITOT <0.2 12/19/2015 1422   GFRNONAA >60 05/22/2020 1536   GFRNONAA 80 01/26/2019 1542   GFRAA >60 05/22/2020 1536   GFRAA 93 01/26/2019 1542     No results found.     Assessment & Plan:   1. Celiac artery stenosis (HCC) The patient's velocities within her celiac arteries are actually less than previous studies in July.  Reviewing the mesenteric duplex over the last several years the velocities have been consistently lower and the stent remains patent.  The patient also does not have any symptoms consistent with worsening celiac artery stenosis.  Previous CT scan done on abdomen pelvis in May 2021 also indicate a widely patent celiac artery.  We will continue to monitor the patient's celiac artery stenosis on an annual basis.  The patient will also continue to follow-up with GI.  2. Mixed hyperlipidemia Continue statin as ordered and reviewed, no changes at this time    Current Outpatient Medications on File Prior to Visit  Medication Sig Dispense Refill  . acetaminophen (TYLENOL) 500 MG tablet Take 500 mg by mouth every 6 (six) hours as needed for mild pain.    . Alfalfa 500 MG TABS Take 500 mg by mouth daily.     . Ascorbic Acid (VITAMIN C CR) 500 MG CPCR Take 500 mg by mouth daily.    [name] aspirin EC 81 MG EC tablet Take 1 tablet (81 mg total) by mouth every evening. 120 tablet 0  .  atorvastatin (LIPITOR) 10 MG tablet Take 1 tablet (10 mg total) by mouth every evening. 30 tablet 6  . Calcium Carbonate (CALCIUM 600 PO) Take by mouth.    . Cholecalciferol (VITAMIN D3) 3000 UNITS TABS Take 3,000 Units by mouth 2 (two) times daily.     [name] CINNAMON PO Take 1,000 mg by mouth daily.     . clotrimazole-betamethasone (LOTRISONE) cream Apply topically 2 (two) times daily. (Patient taking differently: Apply topically 2 (two) times daily as needed.) 30 g 1  . diclofenac sodium (VOLTAREN) 1 % GEL Apply topically as needed.     . famotidine (PEPCID) 20 MG tablet Take 1 tablet (20 mg total) by mouth 2 (two) times daily. 30 tablet 0  . fluticasone (FLONASE) 50 MCG/ACT nasal spray Place 1 spray into both nostrils daily.    . Ginkgo Biloba (GNP GINGKO BILOBA EXTRACT PO) Take 1 tablet by mouth daily.    . Glucosamine Sulfate 500 MG CAPS Take 500 mg by mouth daily.    . hydrALAZINE (APRESOLINE) 10 MG tablet Take 1 tablet (10 mg total) by mouth 3 (three) times daily. As needed for sbp  170 or higher 30 tablet 0  . isosorbide mononitrate (IMDUR) 30 MG 24 hr tablet Take 0.5 tablets (15 mg total) by mouth daily. 45 tablet 3  . Lactobacillus (PROBIOTIC ACIDOPHILUS PO) Take 1 capsule by mouth daily.     [name] loratadine (CLARITIN) 10 MG tablet Take 10  mg by mouth daily as needed for allergies.    . Magnesium 400 MG CAPS Take 400 mg by mouth daily.    . metoprolol succinate (TOPROL XL) 25 MG 24 hr tablet Take 0.5 tablets (12.5 mg total) by mouth daily. 45 tablet 3  . Multiple Vitamin (MULTIVITAMIN) capsule Take 1 capsule by mouth daily.    . mupirocin ointment (BACTROBAN) 2 % Apply to right leg wound daily - a small dab as needed once a day. 15 g 0  . nitroGLYCERIN (NITROSTAT) 0.4 MG SL tablet Place 1 tablet (0.4 mg total) under the tongue every 5 (five) minutes as needed for chest pain. 50 tablet 3  . Omega-3 Fatty Acids (OMEGA 3 PO) Take 520 mg by mouth daily.     . Pramoxine-HC (HYDROCORTISONE  ACE-PRAMOXINE) 2.5-1 % CREA Apply topically as needed.     . Turmeric 450 MG CAPS Take 450 Units by mouth daily.     . vitamin B-12 (CYANOCOBALAMIN) 1000 MCG tablet Take 1,000 mcg by mouth daily.     No current facility-administered medications on file prior to visit.    There are no Patient Instructions on file for this visit. No follow-ups on file.   Kris Hartmann, NP

## 2021-01-12 ENCOUNTER — Other Ambulatory Visit: Payer: Self-pay | Admitting: Internal Medicine

## 2021-02-04 ENCOUNTER — Telehealth: Payer: Self-pay

## 2021-02-04 ENCOUNTER — Other Ambulatory Visit: Payer: Self-pay

## 2021-02-04 ENCOUNTER — Encounter: Payer: Self-pay | Admitting: Gastroenterology

## 2021-02-04 ENCOUNTER — Ambulatory Visit (INDEPENDENT_AMBULATORY_CARE_PROVIDER_SITE_OTHER): Payer: Medicare Other | Admitting: Gastroenterology

## 2021-02-04 VITALS — BP 166/70 | HR 72 | Temp 97.8°F | Ht 60.0 in | Wt 103.2 lb

## 2021-02-04 DIAGNOSIS — R109 Unspecified abdominal pain: Secondary | ICD-10-CM

## 2021-02-04 DIAGNOSIS — R748 Abnormal levels of other serum enzymes: Secondary | ICD-10-CM | POA: Diagnosis not present

## 2021-02-04 NOTE — Progress Notes (Signed)
Brandy Antigua, MD [address]  [place]  [place], [place] [address]  Main: [phone]  Fax: [phone]   Primary Care Physician: Crecencio Mc, MD   Chief Complaint  Patient presents with  . Gastroesophageal Reflux    HPI: [name] is a 87 y.o. female previously seen for dysphagia here for follow-up.  Patient denies any speech pathology and dysphagia has completely resolved.  Her current complaints on this evaluation today is right upper and lower quadrant abdominal pain for the last few weeks, that worsens on walking.  Also reports abdominal bloating.  Reports 1-2 formed bowel movements a day.  No straining.  Previous history of H. pylori that was treated and eradication testing was negative  Previous history: Patient also has chronically elevated AST. Patient also has chronically elevated AST.  Work-up for this has only revealed positive ANA and that patient is immune to hepatitis A.  As per notes from PCP under that ANA result, patient has seen rheumatology for this already.  Current Outpatient Medications  Medication Sig Dispense Refill  . acetaminophen (TYLENOL) 500 MG tablet Take 500 mg by mouth every 6 (six) hours as needed for mild pain.    . Alfalfa 500 MG TABS Take 500 mg by mouth daily.     . Ascorbic Acid (VITAMIN C CR) 500 MG CPCR Take 500 mg by mouth daily.    [name] aspirin EC 81 MG EC tablet Take 1 tablet (81 mg total) by mouth every evening. 120 tablet 0  . atorvastatin (LIPITOR) 10 MG tablet TAKE 1 TABLET BY MOUTH ONCE DAILY IN THE EVENING 90 tablet 0  . Calcium Carbonate (CALCIUM 600 PO) Take by mouth.    . Cholecalciferol (VITAMIN D3) 3000 UNITS TABS Take 3,000 Units by mouth 2 (two) times daily.     [name] CINNAMON PO Take 1,000 mg by mouth daily.     . clotrimazole-betamethasone (LOTRISONE) cream Apply topically 2 (two) times daily. (Patient taking differently: Apply topically 2 (two) times daily as needed.) 30 g 1  . diclofenac sodium  (VOLTAREN) 1 % GEL Apply topically as needed.     . famotidine (PEPCID) 20 MG tablet Take 1 tablet (20 mg total) by mouth 2 (two) times daily. 30 tablet 0  . fluticasone (FLONASE) 50 MCG/ACT nasal spray Place 1 spray into both nostrils daily.    . Ginkgo Biloba (GNP GINGKO BILOBA EXTRACT PO) Take 1 tablet by mouth daily.    . Glucosamine Sulfate 500 MG CAPS Take 500 mg by mouth daily.    . hydrALAZINE (APRESOLINE) 10 MG tablet Take 1 tablet (10 mg total) by mouth 3 (three) times daily. As needed for sbp  170 or higher 30 tablet 0  . isosorbide mononitrate (IMDUR) 30 MG 24 hr tablet Take 0.5 tablets (15 mg total) by mouth daily. 45 tablet 3  . Lactobacillus (PROBIOTIC ACIDOPHILUS PO) Take 1 capsule by mouth daily.     [name] loratadine (CLARITIN) 10 MG tablet Take 10 mg by mouth daily as needed for allergies.    . Magnesium 400 MG CAPS Take 400 mg by mouth daily.    . metoprolol succinate (TOPROL XL) 25 MG 24 hr tablet Take 0.5 tablets (12.5 mg total) by mouth daily. 45 tablet 3  . Multiple Vitamin (MULTIVITAMIN) capsule Take 1 capsule by mouth daily.    . mupirocin ointment (BACTROBAN) 2 % Apply to right leg wound daily - a small dab as needed once a day. 15  g 0  . nitroGLYCERIN (NITROSTAT) 0.4 MG SL tablet Place 1 tablet (0.4 mg total) under the tongue every 5 (five) minutes as needed for chest pain. 50 tablet 3  . Omega-3 Fatty Acids (OMEGA 3 PO) Take 520 mg by mouth daily.     . Pramoxine-HC (HYDROCORTISONE ACE-PRAMOXINE) 2.5-1 % CREA Apply topically as needed.     . Turmeric 450 MG CAPS Take 450 Units by mouth daily.     . vitamin B-12 (CYANOCOBALAMIN) 1000 MCG tablet Take 1,000 mcg by mouth daily.     No current facility-administered medications for this visit.    Allergies as of 02/04/2021 - Review Complete 02/04/2021  Allergen Reaction Noted  . Ibandronic acid Other (See Comments) 08/14/2015  . Other Other (See Comments) 01/19/2012  . Prednisone Rash 12/26/2014  . Actonel [risedronate]  Other (See Comments) 06/19/2014  . Amoxicillin Hives 04/15/2020  . Pantoprazole Other (See Comments) 08/11/2020  . Raloxifene Other (See Comments) 08/14/2015  . Versed [midazolam] Other (See Comments) 12/16/2016  . Doxycycline Rash 12/26/2014    ROS:  General: Negative for anorexia, weight loss, fever, chills, fatigue, weakness. ENT: Negative for hoarseness, difficulty swallowing , nasal congestion. CV: Negative for chest pain, angina, palpitations, dyspnea on exertion, peripheral edema.  Respiratory: Negative for dyspnea at rest, dyspnea on exertion, cough, sputum, wheezing.  GI: See history of present illness. GU:  Negative for dysuria, hematuria, urinary incontinence, urinary frequency, nocturnal urination.  Endo: Negative for unusual weight change.    Physical Examination:   BP (!) 166/70   Pulse 72   Temp 97.8 F (36.6 C) (Oral)   Ht 5' (1.524 m)   Wt 103 lb 3.2 oz (46.8 kg)   BMI 20.15 kg/m   General: Well-nourished, well-developed in no acute distress.  Eyes: No icterus. Conjunctivae pink. Mouth: Oropharyngeal mucosa moist and pink , no lesions erythema or exudate. Neck: Supple, Trachea midline Abdomen: Bowel sounds are normal, nontender, nondistended, no hepatosplenomegaly or masses, no abdominal bruits or hernia , no rebound or guarding.   Extremities: No lower extremity edema. No clubbing or deformities. Neuro: Alert and oriented x 3.  Grossly intact. Skin: Warm and dry, no jaundice.   Psych: Alert and cooperative, normal mood and affect.   Labs: CMP     Component Value Date/Time   NA 138 05/28/2020 1237   NA 143 12/19/2015 1422   K 4.1 05/28/2020 1237   CL 102 05/28/2020 1237   CO2 30 05/28/2020 1237   GLUCOSE 90 05/28/2020 1237   BUN 12 05/28/2020 1237   BUN 15 12/19/2015 1422   CREATININE 0.68 05/28/2020 1237   CREATININE 0.62 01/26/2019 1542   CALCIUM 9.3 05/28/2020 1237   PROT 7.2 05/28/2020 1237   PROT 6.2 12/19/2015 1422   ALBUMIN 4.3  05/28/2020 1237   ALBUMIN 4.0 12/19/2015 1422   AST 39 (H) 05/28/2020 1237   ALT 19 05/28/2020 1237   ALKPHOS 58 05/28/2020 1237   BILITOT 0.5 05/28/2020 1237   BILITOT <0.2 12/19/2015 1422   GFRNONAA >60 05/22/2020 1536   GFRNONAA 80 01/26/2019 1542   GFRAA >60 05/22/2020 1536   GFRAA 93 01/26/2019 1542   Lab Results  Component Value Date   WBC 5.9 05/22/2020   HGB 13.1 05/22/2020   HCT 40.0 05/22/2020   MCV 88.5 05/22/2020   PLT 212 05/22/2020    Imaging Studies: No results found.  Assessment and Plan:   Aaleyah Witherow is a 87 y.o. y/o female with right upper  and lower abdominal pain associated with bloating  Will obtain CT abdomen pelvis due to patient's new abdominal pain  Anti-smooth muscle antibody was ordered on last visit the patient has not completed this test.  Check on future visits  After CT abdomen pelvis is available, we may start patient on Metamucil and MiraLAX temporarily to see if for complete bowel movements help with her bloating and abdominal pain and may be related to underlying constipation  Dr [name]

## 2021-02-04 NOTE — Telephone Encounter (Signed)
Per Dr. Bonna Gains by secure chat: [name], I had ordered anti smooth muscle ab for her on last visit. it never got done, can you ask her to get it done whenever. she can even do it when she goes for her CT to make it easier perhaps.    Called patient but had to leave her a detailed message letting her know that she is still needing this lab drawn. Therefore, I told her to go to [facility] and [address] or to come to our office whenever she had the chance. I also let her know that she could call me prior to answer any question she might have.

## 2021-02-04 NOTE — Patient Instructions (Signed)
Please go to the [facility] and pick up your Contrast at the Radiology Department.

## 2021-02-09 DIAGNOSIS — R748 Abnormal levels of other serum enzymes: Secondary | ICD-10-CM | POA: Diagnosis not present

## 2021-02-09 DIAGNOSIS — R768 Other specified abnormal immunological findings in serum: Secondary | ICD-10-CM | POA: Diagnosis not present

## 2021-02-09 DIAGNOSIS — I7300 Raynaud's syndrome without gangrene: Secondary | ICD-10-CM | POA: Diagnosis not present

## 2021-02-09 DIAGNOSIS — M19042 Primary osteoarthritis, left hand: Secondary | ICD-10-CM | POA: Diagnosis not present

## 2021-02-09 DIAGNOSIS — M19041 Primary osteoarthritis, right hand: Secondary | ICD-10-CM | POA: Diagnosis not present

## 2021-02-10 LAB — ANTI-SMOOTH MUSCLE ANTIBODY, IGG: Smooth Muscle Ab: 4 Units (ref 0–19)

## 2021-02-18 ENCOUNTER — Other Ambulatory Visit: Payer: Self-pay

## 2021-02-18 ENCOUNTER — Ambulatory Visit
Admission: RE | Admit: 2021-02-18 | Discharge: 2021-02-18 | Disposition: A | Discharge status: Home / Self Care | Payer: Medicare Other | Source: Ambulatory Visit | Attending: Gastroenterology | Admitting: Gastroenterology

## 2021-02-18 DIAGNOSIS — R109 Unspecified abdominal pain: Secondary | ICD-10-CM | POA: Insufficient documentation

## 2021-02-18 LAB — POCT I-STAT CREATININE: Creatinine, Ser: 0.7 mg/dL (ref 0.44–1.00)

## 2021-02-18 MED ORDER — IOHEXOL 300 MG/ML  SOLN
75.0000 mL | Freq: Once | INTRAMUSCULAR | Status: AC | PRN
  Administered 2021-02-18: 75 mL via INTRAVENOUS

## 2021-03-10 ENCOUNTER — Telehealth: Payer: Self-pay

## 2021-03-10 DIAGNOSIS — K551 Chronic vascular disorders of intestine: Secondary | ICD-10-CM

## 2021-03-10 NOTE — Telephone Encounter (Signed)
-----   Message from Virgel Manifold, MD sent at 02/23/2021  9:43 AM EDT ----- Herb Grays please let the patient know, I recommend referral to vascular surgery for evaluation of her previously placed celiac stent, and also for evaluation of calcified plaque at the superior mesenteric artery.  This may explain her abdominal pain.  Please place referral and call vascular surgery clinic to make an appointment as soon as possible.

## 2021-03-10 NOTE — Telephone Encounter (Signed)
Patient was notified, referral was faxed and they will contact the patient with an appointment date and time.

## 2021-03-12 ENCOUNTER — Telehealth (INDEPENDENT_AMBULATORY_CARE_PROVIDER_SITE_OTHER): Payer: Self-pay | Admitting: Vascular Surgery

## 2021-03-12 NOTE — Telephone Encounter (Signed)
Documentation only.

## 2021-03-13 ENCOUNTER — Other Ambulatory Visit: Payer: Self-pay

## 2021-03-13 ENCOUNTER — Encounter (INDEPENDENT_AMBULATORY_CARE_PROVIDER_SITE_OTHER): Payer: Self-pay | Admitting: Vascular Surgery

## 2021-03-13 ENCOUNTER — Ambulatory Visit (INDEPENDENT_AMBULATORY_CARE_PROVIDER_SITE_OTHER): Payer: Medicare Other | Admitting: Vascular Surgery

## 2021-03-13 VITALS — BP 176/82 | HR 54 | Resp 16 | Wt 101.2 lb

## 2021-03-13 DIAGNOSIS — I774 Celiac artery compression syndrome: Secondary | ICD-10-CM | POA: Diagnosis not present

## 2021-03-13 DIAGNOSIS — E782 Mixed hyperlipidemia: Secondary | ICD-10-CM | POA: Diagnosis not present

## 2021-03-13 DIAGNOSIS — N183 Chronic kidney disease, stage 3 unspecified: Secondary | ICD-10-CM | POA: Diagnosis not present

## 2021-03-13 DIAGNOSIS — I771 Stricture of artery: Secondary | ICD-10-CM

## 2021-03-13 DIAGNOSIS — I700 Atherosclerosis of aorta: Secondary | ICD-10-CM

## 2021-03-13 NOTE — Assessment & Plan Note (Signed)
Her findings on the CT scan are not surprising and are stable from what has been expected from her previous studies.  The mesenteric artery duplex actually is more informative as it gives velocity criteria and it has been stable.  I would not recommend any intervention for this patient.  She already has a follow-up scheduled with her mesenteric duplex and we will continue to monitor with this.

## 2021-03-13 NOTE — Progress Notes (Signed)
MRN : [id number]  [name] is a 87 y.o. (September 17, 1927) female who presents with chief complaint of  Chief Complaint  Patient presents with  . Follow-up    Ref [place] chronic mesenteric ischemia  .  History of Present Illness: Patient returns today in follow up of her known mesenteric artery disease.  She had a recent CT venogram of her abdomen pelvis performed which I have reviewed.  There was comment on plaque in her aorta and at the level of her visceral vessels.  For this reason, she is referred back for further evaluation of her mesenteric disease.  She had a mesenteric duplex performed a couple of months ago which showed a patent celiac artery stent with mild increased velocities but stable and normal velocities in the SMA.  Her symptoms are vague and difficult to discern whether or not they are truly from ischemic issues but they do not sound to be.  She has had no major changes or problems since her last visit other than her chronic issues.  Current Outpatient Medications  Medication Sig Dispense Refill  . acetaminophen (TYLENOL) 500 MG tablet Take 500 mg by mouth every 6 (six) hours as needed for mild pain.    . Alfalfa 500 MG TABS Take 500 mg by mouth daily.     . Ascorbic Acid (VITAMIN C CR) 500 MG CPCR Take 500 mg by mouth daily.    [name] aspirin EC 81 MG EC tablet Take 1 tablet (81 mg total) by mouth every evening. 120 tablet 0  . atorvastatin (LIPITOR) 10 MG tablet TAKE 1 TABLET BY MOUTH ONCE DAILY IN THE EVENING 90 tablet 0  . Calcium Carbonate (CALCIUM 600 PO) Take by mouth.    . Cholecalciferol (VITAMIN D3) 3000 UNITS TABS Take 3,000 Units by mouth 2 (two) times daily.     [name] CINNAMON PO Take 1,000 mg by mouth daily.     . clotrimazole-betamethasone (LOTRISONE) cream Apply topically 2 (two) times daily. (Patient taking differently: Apply topically 2 (two) times daily as needed.) 30 g 1  . diclofenac sodium (VOLTAREN) 1 % GEL Apply topically as needed.     .  famotidine (PEPCID) 20 MG tablet Take 1 tablet (20 mg total) by mouth 2 (two) times daily. 30 tablet 0  . fluticasone (FLONASE) 50 MCG/ACT nasal spray Place 1 spray into both nostrils daily.    . Ginkgo Biloba (GNP GINGKO BILOBA EXTRACT PO) Take 1 tablet by mouth daily.    . Glucosamine Sulfate 500 MG CAPS Take 500 mg by mouth daily.    . hydrALAZINE (APRESOLINE) 10 MG tablet Take 1 tablet (10 mg total) by mouth 3 (three) times daily. As needed for sbp  170 or higher 30 tablet 0  . isosorbide mononitrate (IMDUR) 30 MG 24 hr tablet Take 0.5 tablets (15 mg total) by mouth daily. 45 tablet 3  . Lactobacillus (PROBIOTIC ACIDOPHILUS PO) Take 1 capsule by mouth daily.     [name] loratadine (CLARITIN) 10 MG tablet Take 10 mg by mouth daily as needed for allergies.    . Magnesium 400 MG CAPS Take 400 mg by mouth daily.    . metoprolol succinate (TOPROL XL) 25 MG 24 hr tablet Take 0.5 tablets (12.5 mg total) by mouth daily. 45 tablet 3  . Multiple Vitamin (MULTIVITAMIN) capsule Take 1 capsule by mouth daily.    . mupirocin ointment (BACTROBAN) 2 % Apply to right leg wound daily - a small dab as needed once  a day. 15 g 0  . nitroGLYCERIN (NITROSTAT) 0.4 MG SL tablet Place 1 tablet (0.4 mg total) under the tongue every 5 (five) minutes as needed for chest pain. 50 tablet 3  . Omega-3 Fatty Acids (OMEGA 3 PO) Take 520 mg by mouth daily.     . pantoprazole (PROTONIX) 20 MG tablet Take 20 mg by mouth daily.    . Pramoxine-HC (HYDROCORTISONE ACE-PRAMOXINE) 2.5-1 % CREA Apply topically as needed.     . Turmeric 450 MG CAPS Take 450 Units by mouth daily.     . vitamin B-12 (CYANOCOBALAMIN) 1000 MCG tablet Take 1,000 mcg by mouth daily.     No current facility-administered medications for this visit.    Past Medical History:  Diagnosis Date  . Allergy   . Arthritis   . Atherosclerosis   . Atherosclerosis of abdominal aorta ([facility])   . Chronic kidney disease    stage 2  . CKD (chronic kidney disease) stage 2,  GFR 60-89 ml/min   . Collagen vascular disease ([place])   . Diverticulosis 2008  . GERD (gastroesophageal reflux disease)   . Heart murmur   . Hyperlipidemia   . Lumbar spinal stenosis   . Osteoporosis   . Ovarian lump   . RA (rheumatoid arthritis) ([place])   . Raynaud's disease   . Rheumatoid arthritis ([place])   . Spinal stenosis     Past Surgical History:  Procedure Laterality Date  . BREAST SURGERY Left 1965   Benign biopsy  . CHOLECYSTECTOMY  2005   [facility]  . COLONOSCOPY  2008   Dr. Donnella Sham  . EXCISION OF BREAST BIOPSY Left 1965   benign  . EYE SURGERY Bilateral    Cataract Extraction with IOL  . KYPHOPLASTY N/A 12/17/2016   Procedure: KYPHOPLASTY;  Surgeon: Hessie Knows, MD;  Location: [facility] ORS;  Service: Orthopedics;  Laterality: N/A;  l4  . LUMBAR LAMINECTOMY  05/09/2017   L2-L5  . VISCERAL ANGIOGRAPHY N/A 08/15/2017   Procedure: VISCERAL ANGIOGRAPHY;  Surgeon: Algernon Huxley, MD;  Location: [facility];  Service: Cardiovascular;  Laterality: N/A;  . VISCERAL ANGIOGRAPHY N/A 10/05/2017   Procedure: VISCERAL ANGIOGRAPHY;  Surgeon: Algernon Huxley, MD;  Location: [facility];  Service: Cardiovascular;  Laterality: N/A;  . VISCERAL ARTERY INTERVENTION N/A 08/15/2017   Procedure: VISCERAL ARTERY INTERVENTION;  Surgeon: Algernon Huxley, MD;  Location: [facility];  Service: Cardiovascular;  Laterality: N/A;     Social History   Tobacco Use  . Smoking status: Never Smoker  . Smokeless tobacco: Never Used  . Tobacco comment: smoking cessation materials not required  Vaping Use  . Vaping Use: Never used  Substance Use Topics  . Alcohol use: No    Alcohol/week: 0.0 standard drinks  . Drug use: No      Family History  Problem Relation Age of Onset  . Heart disease Mother   . Hypertension Father   . Heart disease Brother 47       heart attack  . Cancer Brother        bladder  . Diabetes Sister   . Alcohol abuse Sister   . Kidney disease Sister    . Diabetes Brother   . Stroke Brother   . Diabetes Maternal Grandmother   . Heart disease Maternal Grandmother   . Cancer Brother        lung     Allergies  Allergen Reactions  . Ibandronic Acid Other (See  Comments)    Achy all over. Flu like S/S  . Other Other (See Comments)    Achy all over. Flu like S/S Dysphagia  . Prednisone Rash  . Actonel [Risedronate] Other (See Comments)    Dysphagia  . Amoxicillin Hives  . Pantoprazole Other (See Comments)    Little blistery bumps  . Raloxifene Other (See Comments)    Mood swings  . Versed [Midazolam] Other (See Comments)    Difficult waking up and memory loss  . Doxycycline Rash    REVIEW OF SYSTEMS(Negative unless checked)  Constitutional: [x] ?Weight loss[] ?Fever[] ?Chills Cardiac:[] ?Chest pain[] ?Chest pressure[x] ?Palpitations [] ?Shortness of breath when laying flat [] ?Shortness of breath at rest [] ?Shortness of breath with exertion. Vascular: [] ?Pain in legs with walking[] ?Pain in legsat rest[] ?Pain in legs when laying flat [] ?Claudication [] ?Pain in feet when walking [] ?Pain in feet at rest [] ?Pain in feet when laying flat [] ?History of DVT [] ?Phlebitis [] ?Swelling in legs [x] ?Varicose veins [] ?Non-healing ulcers Pulmonary: [] ?Uses home oxygen [] ?Productive cough[] ?Hemoptysis [] ?Wheeze [] ?COPD [] ?Asthma Neurologic: [] ?Dizziness [] ?Blackouts [] ?Seizures [] ?History of stroke [] ?History of TIA[] ?Aphasia [] ?Temporary blindness[] ?Dysphagia [] ?Weaknessor numbness in arms [] ?Weakness or numbnessin legs Musculoskeletal: [x] ?Arthritis [] ?Joint swelling [] ?Joint pain [x] ?Low back pain Hematologic:[] ?Easy bruising[] ?Easy bleeding [] ?Hypercoagulable state [] ?Anemic  Gastrointestinal:[] ?Blood in stool[] ?Vomiting blood[x] ?Gastroesophageal reflux/heartburn[x] ?Abdominal pain Genitourinary: [x] ?Chronic kidney disease [] ?Difficulturination  [] ?Frequenturination [] ?Burning with urination[] ?Hematuria Skin: [] ?Rashes [] ?Ulcers [] ?Wounds Psychological: [x] ?History of anxiety[] ?History of major depression.  Physical Examination  BP (!) 176/82 (BP Location: Right Arm)   Pulse (!) 54   Resp 16   Wt 101 lb 3.2 oz (45.9 kg)   BMI 19.76 kg/m  Gen:  WD/WN, NAD.  Appears younger than stated age Head: [place]/AT, No temporalis wasting. Ear/Nose/Throat: Hearing grossly intact, nares w/o erythema or drainage Eyes: Conjunctiva clear. Sclera non-icteric Neck: Supple.  Trachea midline Pulmonary:  Good air movement, no use of accessory muscles.  Cardiac: Irregular Vascular:  Vessel Right Left  Radial Palpable Palpable       Gastrointestinal: soft, non-tender/non-distended. No guarding/reflex.  Musculoskeletal: M/S 5/5 throughout.  No deformity or atrophy.  No significant lower extremity edema. Neurologic: Sensation grossly intact in extremities.  Symmetrical.  Speech is fluent.  Psychiatric: Judgment intact, Mood & affect appropriate for pt's clinical situation. Dermatologic: No rashes or ulcers noted.  No cellulitis or open wounds.       Labs Recent Results (from the past 2160 hour(s))  Anti-smooth muscle antibody, IgG     Status: None   Collection Time: 02/09/21  2:52 PM  Result Value Ref Range   Smooth Muscle Ab 4 0 - 19 Units    Comment:                  Negative                     0 - 19                  Weak positive               20 - 30                  Moderate to strong positive     >30  Actin Antibodies are found in 52-85% of patients with  autoimmune hepatitis or chronic active hepatitis and  in 22% of patients with primary biliary cirrhosis.   I-STAT creatinine     Status: None   Collection Time: 02/18/21  2:08 PM  Result Value Ref Range   Creatinine, Ser 0.70 0.44 - 1.00  mg/dL    Radiology CT Abdomen Pelvis W Contrast  Result Date: 02/19/2021 CLINICAL DATA:  Generalized abdominal pain for  2 months. EXAM: CT ABDOMEN AND PELVIS WITH CONTRAST TECHNIQUE: Multidetector CT imaging of the abdomen and pelvis was performed using the standard protocol following bolus administration of intravenous contrast. CONTRAST:  43mL OMNIPAQUE IOHEXOL 300 MG/ML  SOLN COMPARISON:  CT of the abdomen and pelvis with contrast on 07/24/2019 FINDINGS: Lower chest: No acute abnormality. Hepatobiliary: No focal liver abnormality is seen. Status post cholecystectomy. No biliary dilatation. Pancreas: Unremarkable. No pancreatic ductal dilatation or surrounding inflammatory changes. Spleen: Normal in size without focal abnormality. Adrenals/Urinary Tract: Adrenal glands are unremarkable. Kidneys are normal, without renal calculi, focal lesion, or hydronephrosis. Bladder is moderately distended with urine. Stomach/Bowel: Bowel shows no evidence of obstruction, ileus, inflammation or visible lesion. No free intraperitoneal air identified. Vascular/Lymphatic: Previously placed intravascular stent again noted in the proximal celiac axis. Stent lumen is not adequately evaluated on the venous phase of imaging but demonstrates potential neo-intimal hyperplasia within its proximal to mid segment and intrastent stenosis cannot be excluded. There is heavily calcified plaque at the origin of the superior mesenteric artery. The inferior mesenteric artery origin is difficult to identify but the IMA trunk appears to be open. Reproductive: Calcifications within a postmenopausal uterus. No adnexal masses. Other: No abdominal wall hernia or abnormality. No abdominopelvic ascites. Musculoskeletal: Prior vertebral augmentation at L4. Loss of height of L2 with approximately 50-60% compression. IMPRESSION: 1. No evidence of acute inflammatory process or gross bowel ischemia. 2. Previously placed intravascular stent in the proximal celiac axis. Stent lumen is not adequately evaluated on the venous phase of imaging but demonstrates potential neo-intimal  hyperplasia within its proximal to mid segment and intrastent stenosis cannot be excluded. Consider CTA of the abdomen and pelvis for mesenteric arterial evaluation and if symptoms are suggestive of mesenteric ischemia. 3. Heavily calcified plaque at the origin of the superior mesenteric artery. The inferior mesenteric artery origin is difficult to identify but the IMA trunk appears to be open. 4. Prior vertebral augmentation at L4. Loss of height of L2 with approximately 50-60% compression. Electronically Signed   By: [name] M.D.   On: 02/19/2021 08:24    Assessment/Plan Hyperlipidemia lipid control important in reducing the progression of atherosclerotic disease.  Chronic kidney disease, stage III (moderate) Given the need to limit contrast, we willbe cautious with any procedures.  Atherosclerosis of abdominal aorta ([facility]) Known and not the cause of her abdominal pain  Celiac artery stenosis ([place]) Her findings on the CT scan are not surprising and are stable from what has been expected from her previous studies.  The mesenteric artery duplex actually is more informative as it gives velocity criteria and it has been stable.  I would not recommend any intervention for this patient.  She already has a follow-up scheduled with her mesenteric duplex and we will continue to monitor with this.    Leotis Pain, MD  03/13/2021 2:03 PM    This note was created with Dragon medical transcription system.  Any errors from dictation are purely unintentional

## 2021-03-16 ENCOUNTER — Telehealth: Payer: Self-pay | Admitting: Internal Medicine

## 2021-03-16 NOTE — Telephone Encounter (Signed)
Pt returned your call.  

## 2021-03-16 NOTE — Telephone Encounter (Signed)
LMTCB

## 2021-03-16 NOTE — Telephone Encounter (Signed)
Patient called in wanted Dr.Tullo to referral her to a heart doctor Arida  At [facility]

## 2021-03-16 NOTE — Telephone Encounter (Signed)
Pt was advised that she would need to schedule an appt with Dr. Derrel Nip to discuss the need for the referral. Pt stated that she has not seen one in over a year and would like to get a referral to see Dr. Fletcher Anon. Pt was put on Metoprolol and Imdur last year after being in the hospital and about 2 weeks ago she stated that she started having some blister likes places on her thighs and some hives on her on her stomach. She started using a cream(didn't know the name of it) and cut both the metoprolol and isosorbide into a 1/4 and stated that the rash is now gone. Pt has been scheduled for an appt with Dr. Derrel Nip on 04/01/2021 at 2:30 pm.

## 2021-04-01 ENCOUNTER — Ambulatory Visit (INDEPENDENT_AMBULATORY_CARE_PROVIDER_SITE_OTHER): Payer: Medicare Other | Admitting: Internal Medicine

## 2021-04-01 ENCOUNTER — Other Ambulatory Visit: Payer: Self-pay

## 2021-04-01 ENCOUNTER — Ambulatory Visit (INDEPENDENT_AMBULATORY_CARE_PROVIDER_SITE_OTHER): Payer: Medicare Other | Admitting: Gastroenterology

## 2021-04-01 ENCOUNTER — Encounter: Payer: Self-pay | Admitting: Internal Medicine

## 2021-04-01 ENCOUNTER — Encounter: Payer: Self-pay | Admitting: Gastroenterology

## 2021-04-01 VITALS — BP 146/90 | HR 54 | Wt 100.6 lb

## 2021-04-01 VITALS — BP 150/78 | HR 73 | Temp 96.6°F | Resp 14 | Ht 60.0 in | Wt 101.6 lb

## 2021-04-01 DIAGNOSIS — I700 Atherosclerosis of aorta: Secondary | ICD-10-CM | POA: Diagnosis not present

## 2021-04-01 DIAGNOSIS — I774 Celiac artery compression syndrome: Secondary | ICD-10-CM

## 2021-04-01 DIAGNOSIS — I771 Stricture of artery: Secondary | ICD-10-CM

## 2021-04-01 DIAGNOSIS — I2089 Other forms of angina pectoris: Secondary | ICD-10-CM

## 2021-04-01 DIAGNOSIS — I10 Essential (primary) hypertension: Secondary | ICD-10-CM | POA: Diagnosis not present

## 2021-04-01 DIAGNOSIS — K5900 Constipation, unspecified: Secondary | ICD-10-CM

## 2021-04-01 DIAGNOSIS — Z66 Do not resuscitate: Secondary | ICD-10-CM | POA: Diagnosis not present

## 2021-04-01 DIAGNOSIS — R1013 Epigastric pain: Secondary | ICD-10-CM

## 2021-04-01 DIAGNOSIS — R4589 Other symptoms and signs involving emotional state: Secondary | ICD-10-CM

## 2021-04-01 DIAGNOSIS — F418 Other specified anxiety disorders: Secondary | ICD-10-CM | POA: Diagnosis not present

## 2021-04-01 DIAGNOSIS — I208 Other forms of angina pectoris: Secondary | ICD-10-CM

## 2021-04-01 MED ORDER — OMEPRAZOLE 20 MG PO CPDR: 20.0000 mg | DELAYED_RELEASE_CAPSULE | Freq: Every day | ORAL | 0 refills | Status: DC

## 2021-04-01 NOTE — Progress Notes (Signed)
Brandy Antigua, MD [address]  [place]  [place], [place] [address]  Main: [phone]  Fax: [phone]   Primary Care Physician: Crecencio Mc, MD   Chief complaint: Abdominal pain  HPI: [name] is a 87 y.o. female here for follow-up of abdominal pain.  Patient was referred to vascular surgery given her CT findings and as per Dr. Marcha Solders, CT scan findings are not surprising.  Did not recommend any intervention at this time except for mesenteric Doppler that they will be scheduling.  Patient denies any abdominal pain after meals.  States her abdominal pain occurs in the epigastric region as a burning sensation specially when she lays down and she does have some tightness in her diffuse abdomen at that time as well.  No nausea or vomiting.  She states she is now having 1 formed bowel movement daily, but sometimes does have a day where she skips a bowel movement and then she takes a herbal supplement to help her go that day.  No blood in stool.  No weight loss.  She was previously also seen for dysphagia and found to have oropharyngeal dysphagia on speech and swallow evaluation.  She continues to have some symptoms of solid food dysphagia and esophagram was limited due to laryngeal aspiration, but did not show any esophageal lesions  Has previous been treated for H. pylori and eradication confirmed with breath test  Current Outpatient Medications  Medication Sig Dispense Refill  . acetaminophen (TYLENOL) 500 MG tablet Take 500 mg by mouth every 6 (six) hours as needed for mild pain.    . Alfalfa 500 MG TABS Take 500 mg by mouth daily.     . Ascorbic Acid (VITAMIN C CR) 500 MG CPCR Take 500 mg by mouth daily.    [name] aspirin EC 81 MG EC tablet Take 1 tablet (81 mg total) by mouth every evening. 120 tablet 0  . atorvastatin (LIPITOR) 10 MG tablet TAKE 1 TABLET BY MOUTH ONCE DAILY IN THE EVENING 90 tablet 0  . Calcium Carbonate (CALCIUM 600 PO) Take by mouth.    .  Cholecalciferol (VITAMIN D3) 3000 UNITS TABS Take 3,000 Units by mouth 2 (two) times daily.     [name] CINNAMON PO Take 1,000 mg by mouth daily.     . clotrimazole-betamethasone (LOTRISONE) cream Apply topically 2 (two) times daily. (Patient taking differently: Apply topically 2 (two) times daily as needed.) 30 g 1  . diclofenac sodium (VOLTAREN) 1 % GEL Apply topically as needed.     . fluticasone (FLONASE) 50 MCG/ACT nasal spray Place 1 spray into both nostrils daily.    . Ginkgo Biloba (GNP GINGKO BILOBA EXTRACT PO) Take 1 tablet by mouth daily.    . Glucosamine Sulfate 500 MG CAPS Take 500 mg by mouth daily.    . hydrALAZINE (APRESOLINE) 10 MG tablet Take 1 tablet (10 mg total) by mouth 3 (three) times daily. As needed for sbp  170 or higher 30 tablet 0  . isosorbide mononitrate (IMDUR) 30 MG 24 hr tablet Take 0.5 tablets (15 mg total) by mouth daily. 45 tablet 3  . Lactobacillus (PROBIOTIC ACIDOPHILUS PO) Take 1 capsule by mouth daily.     [name] loratadine (CLARITIN) 10 MG tablet Take 10 mg by mouth daily as needed for allergies.    . Magnesium 400 MG CAPS Take 400 mg by mouth daily.    . metoprolol succinate (TOPROL XL) 25 MG 24 hr tablet Take 0.5 tablets (  12.5 mg total) by mouth daily. 45 tablet 3  . Multiple Vitamin (MULTIVITAMIN) capsule Take 1 capsule by mouth daily.    . mupirocin ointment (BACTROBAN) 2 % Apply to right leg wound daily - a small dab as needed once a day. 15 g 0  . nitroGLYCERIN (NITROSTAT) 0.4 MG SL tablet Place 1 tablet (0.4 mg total) under the tongue every 5 (five) minutes as needed for chest pain. 50 tablet 3  . Omega-3 Fatty Acids (OMEGA 3 PO) Take 520 mg by mouth daily.     [name] omeprazole (PRILOSEC) 20 MG capsule Take 1 capsule (20 mg total) by mouth daily. 30 capsule 0  . Pramoxine-HC (HYDROCORTISONE ACE-PRAMOXINE) 2.5-1 % CREA Apply topically as needed.     . Turmeric 450 MG CAPS Take 450 Units by mouth daily.     . vitamin B-12 (CYANOCOBALAMIN) 1000 MCG tablet Take 1,000  mcg by mouth daily.     No current facility-administered medications for this visit.    Allergies as of 04/01/2021 - Review Complete 04/01/2021  Allergen Reaction Noted  . Ibandronic acid Other (See Comments) 08/14/2015  . Other Other (See Comments) 01/19/2012  . Prednisone Rash 12/26/2014  . Actonel [risedronate] Other (See Comments) 06/19/2014  . Amoxicillin Hives 04/15/2020  . Pantoprazole Other (See Comments) 08/11/2020  . Raloxifene Other (See Comments) 08/14/2015  . Versed [midazolam] Other (See Comments) 12/16/2016  . Doxycycline Rash 12/26/2014    ROS:  General: Negative for anorexia, weight loss, fever, chills, fatigue, weakness. ENT: Negative for hoarseness, difficulty swallowing , nasal congestion. CV: Negative for chest pain, angina, palpitations, dyspnea on exertion, peripheral edema.  Respiratory: Negative for dyspnea at rest, dyspnea on exertion, cough, sputum, wheezing.  GI: See history of present illness. GU:  Negative for dysuria, hematuria, urinary incontinence, urinary frequency, nocturnal urination.  Endo: Negative for unusual weight change.    Physical Examination:   BP (!) 146/90   Pulse (!) 54   Wt 100 lb 9.6 oz (45.6 kg)   BMI 19.65 kg/m   General: Well-nourished, well-developed in no acute distress.  Eyes: No icterus. Conjunctivae pink. Mouth: Oropharyngeal mucosa moist and pink , no lesions erythema or exudate. Neck: Supple, Trachea midline Abdomen: Bowel sounds are normal, nontender, nondistended, no hepatosplenomegaly or masses, no abdominal bruits or hernia , no rebound or guarding.   Extremities: No lower extremity edema. No clubbing or deformities. Neuro: Alert and oriented x 3.  Grossly intact. Skin: Warm and dry, no jaundice.   Psych: Alert and cooperative, normal mood and affect.   Labs: CMP     Component Value Date/Time   NA 138 05/28/2020 1237   NA 143 12/19/2015 1422   K 4.1 05/28/2020 1237   CL 102 05/28/2020 1237   CO2 30  05/28/2020 1237   GLUCOSE 90 05/28/2020 1237   BUN 12 05/28/2020 1237   BUN 15 12/19/2015 1422   CREATININE 0.70 02/18/2021 1408   CREATININE 0.62 01/26/2019 1542   CALCIUM 9.3 05/28/2020 1237   PROT 7.2 05/28/2020 1237   PROT 6.2 12/19/2015 1422   ALBUMIN 4.3 05/28/2020 1237   ALBUMIN 4.0 12/19/2015 1422   AST 39 (H) 05/28/2020 1237   ALT 19 05/28/2020 1237   ALKPHOS 58 05/28/2020 1237   BILITOT 0.5 05/28/2020 1237   BILITOT <0.2 12/19/2015 1422   GFRNONAA >60 05/22/2020 1536   GFRNONAA 80 01/26/2019 1542   GFRAA >60 05/22/2020 1536   GFRAA 93 01/26/2019 1542   Lab Results  Component Value  Date   WBC 5.9 05/22/2020   HGB 13.1 05/22/2020   HCT 40.0 05/22/2020   MCV 88.5 05/22/2020   PLT 212 05/22/2020    Imaging Studies: No results found.  Assessment and Plan:   [name] is a 87 y.o. y/o female here for follow-up of abdominal pain  Epigastric burning sensation that occurs specially when she lays down is likely from reflux.  She is on Pepcid twice a day, although she forgets taking the evening dose sometimes.  Therefore we will change to PPI at this time and reassess symptoms.  Protonix is listed as an allergy but patient does not recall having any allergic reaction to Protonix.  We will prescribe omeprazole 20 mg once daily for now given patient has not done well with H2 RA therapy.  (Risks of PPI use were discussed with patient including bone loss, C. Diff diarrhea, pneumonia, infections, CKD, electrolyte abnormalities.  Pt. Verbalizes understanding and chooses to continue the medication.)  Given her ongoing dysphagia and limited evaluation of the esophagus on esophagram, I did discuss upper endoscopy, but patient states she is not interested in an upper endoscopy at this time.  If symptoms continue, she states she will reconsider and call us back.  Given that she is having days where she skips a bowel movement and then takes a laxative, I have asked her to  start taking Metamucil daily as she does have it at home, but has not been taking it.  Metamucil once daily, hold for 1 to 3 days for loose stools.  Goal of 1-2 soft bowel movements per day.  Follow-up in clinic in about 6 weeks to reassess symptoms    Dr [name]

## 2021-04-01 NOTE — Patient Instructions (Signed)
I recommend that you increase your isosorbide mononitrate to 1/2 tablet  continue 1/2 tablet of metoprolol for 2 weeks to see if you have any reaction to the isosorbide   You do NOT have any kidney dysfunction ,  And we are checking it again today

## 2021-04-01 NOTE — Progress Notes (Signed)
Subjective:  Patient ID: [name], female    DOB: 08-Jun-1927  Age: 87 y.o. MRN: [id number]  CC: The primary encounter diagnosis was Accelerated hypertension. Diagnoses of Epigastric pain, Angina of effort [facility]), Atherosclerosis of abdominal aorta (HCC), Celiac artery stenosis ([place]), Do not resuscitate status, and Anxiety about health were also pertinent to this visit.  HPI [name] presents for follow up on multiple issues, including  Chronic abdominal pain, aortic atherosclerosis , hypertension and hyperlipidemia   This visit occurred during the SARS-CoV-2 public health emergency.  Safety protocols were in place, including screening questions prior to the visit, additional usage of staff PPE, and extensive cleaning of exam room while observing appropriate contact time as indicated for disinfecting solutions.   Abd pain:  She recently followed up with Vascular Surgery and  GI for recurrent epigastric pain of unknown etiology aggravated by lying down.  Her H2 blocker was stopped and therapy changed from  To PPI for presumed GERD   Saw Vascular for follow up on celiac artery stenosis:  No intervention planned .  Not considered to be the cause of her abd pain .   Seeing rheumatology for OA of hands, Raynaud's.  voltaren gel prescribed.   Requesting cardiology referral for evaluation and treatment of : angina.  Reviewed prior workup.  Negative myoview June 2021 by [facility] Cardiologist Agbor Eldridge Abrahams. ECHO normal.  She has reduced her doses of Imdur and metoprolol because after 11 months of therapy she developed a bumpy  Pruritic rash followed by a whelpy kind of rash on her chest and abdomen that occurred in April that she attributed to her medictions. The rash has resolved. She is still taking 1/4 imdur and 1/4  metoprolol at night  .  She checks bp several days per week . More often if not feeling good.   Systolic has been as high as 948,   And diastolic in 54-62.  She  acknowledges that she experiences recurrent anxiety regarding her health issues,  Which is driving her frequent visits to specialists.  She is concerned about becoming a burden to her daughters.  Outpatient Medications Prior to Visit  Medication Sig Dispense Refill  . acetaminophen (TYLENOL) 500 MG tablet Take 500 mg by mouth every 6 (six) hours as needed for mild pain.    . Alfalfa 500 MG TABS Take 500 mg by mouth daily.     . Ascorbic Acid (VITAMIN C CR) 500 MG CPCR Take 500 mg by mouth daily.    [name] aspirin EC 81 MG EC tablet Take 1 tablet (81 mg total) by mouth every evening. 120 tablet 0  . atorvastatin (LIPITOR) 10 MG tablet TAKE 1 TABLET BY MOUTH ONCE DAILY IN THE EVENING 90 tablet 0  . Calcium Carbonate (CALCIUM 600 PO) Take by mouth.    . Cholecalciferol (VITAMIN D3) 3000 UNITS TABS Take 3,000 Units by mouth 2 (two) times daily.     [name] CINNAMON PO Take 1,000 mg by mouth daily.     . clotrimazole-betamethasone (LOTRISONE) cream Apply topically 2 (two) times daily. (Patient taking differently: Apply topically 2 (two) times daily as needed.) 30 g 1  . diclofenac sodium (VOLTAREN) 1 % GEL Apply topically as needed.     . fluticasone (FLONASE) 50 MCG/ACT nasal spray Place 1 spray into both nostrils daily.    . Ginkgo Biloba (GNP GINGKO BILOBA EXTRACT PO) Take 1 tablet by mouth daily.    . Glucosamine Sulfate 500 MG CAPS Take  500 mg by mouth daily.    . hydrALAZINE (APRESOLINE) 10 MG tablet Take 1 tablet (10 mg total) by mouth 3 (three) times daily. As needed for sbp  170 or higher 30 tablet 0  . isosorbide mononitrate (IMDUR) 30 MG 24 hr tablet Take 0.5 tablets (15 mg total) by mouth daily. 45 tablet 3  . Lactobacillus (PROBIOTIC ACIDOPHILUS PO) Take 1 capsule by mouth daily.     [name] loratadine (CLARITIN) 10 MG tablet Take 10 mg by mouth daily as needed for allergies.    . Magnesium 400 MG CAPS Take 400 mg by mouth daily.    . metoprolol succinate (TOPROL XL) 25 MG 24 hr tablet Take 0.5 tablets  (12.5 mg total) by mouth daily. 45 tablet 3  . Multiple Vitamin (MULTIVITAMIN) capsule Take 1 capsule by mouth daily.    . mupirocin ointment (BACTROBAN) 2 % Apply to right leg wound daily - a small dab as needed once a day. 15 g 0  . nitroGLYCERIN (NITROSTAT) 0.4 MG SL tablet Place 1 tablet (0.4 mg total) under the tongue every 5 (five) minutes as needed for chest pain. 50 tablet 3  . Omega-3 Fatty Acids (OMEGA 3 PO) Take 520 mg by mouth daily.     [name] omeprazole (PRILOSEC) 20 MG capsule Take 1 capsule (20 mg total) by mouth daily. 30 capsule 0  . Pramoxine-HC (HYDROCORTISONE ACE-PRAMOXINE) 2.5-1 % CREA Apply topically as needed.     . Turmeric 450 MG CAPS Take 450 Units by mouth daily.     . vitamin B-12 (CYANOCOBALAMIN) 1000 MCG tablet Take 1,000 mcg by mouth daily.     No facility-administered medications prior to visit.    Review of Systems;  Patient denies headache, fevers, malaise, unintentional weight loss, skin rash, eye pain, sinus congestion and sinus pain, sore throat, dysphagia,  hemoptysis , cough, dyspnea, wheezing, chest pain, palpitations, orthopnea, edema, abdominal pain, nausea, melena, diarrhea, constipation, flank pain, dysuria, hematuria, urinary  Frequency, nocturia, numbness, tingling, seizures,  Focal weakness, Loss of consciousness,  Tremor, insomnia, depression, anxiety, and suicidal ideation.      Objective:  BP (!) 150/78 (BP Location: Left Arm, Patient Position: Sitting, Cuff Size: Normal)   Pulse 73   Temp (!) 96.6 F (35.9 C) (Temporal)   Resp 14   Ht 5' (1.524 m)   Wt 101 lb 9.6 oz (46.1 kg)   SpO2 95%   BMI 19.84 kg/m   BP Readings from Last 3 Encounters:  04/01/21 (!) 150/78  04/01/21 (!) 146/90  03/13/21 (!) 176/82    Wt Readings from Last 3 Encounters:  04/01/21 101 lb 9.6 oz (46.1 kg)  04/01/21 100 lb 9.6 oz (45.6 kg)  03/13/21 101 lb 3.2 oz (45.9 kg)    General appearance: alert, cooperative and appears stated age Ears: normal TM's and  external ear canals both ears Throat: lips, mucosa, and tongue normal; teeth and gums normal Neck: no adenopathy, no carotid bruit, supple, symmetrical, trachea midline and thyroid not enlarged, symmetric, no tenderness/mass/nodules Back: symmetric, no curvature. ROM normal. No CVA tenderness. Lungs: clear to auscultation bilaterally Heart: regular rate and rhythm, S1, S2 normal, no murmur, click, rub or gallop Abdomen: soft, non-tender; bowel sounds normal; no masses,  no organomegaly Pulses: 2+ and symmetric Skin: Skin color, texture, turgor normal. No rashes or lesions Lymph nodes: Cervical, supraclavicular, and axillary nodes normal.  Lab Results  Component Value Date   HGBA1C 6.1 (H) 05/14/2019   HGBA1C 6.2 (H) 01/01/2019  HGBA1C 6.1 (H) 03/14/2018    Lab Results  Component Value Date   CREATININE 0.62 04/01/2021   CREATININE 0.70 02/18/2021   CREATININE 0.68 05/28/2020    Lab Results  Component Value Date   WBC 5.9 05/22/2020   HGB 13.1 05/22/2020   HCT 40.0 05/22/2020   PLT 212 05/22/2020   GLUCOSE 154 (H) 04/01/2021   CHOL 137 04/03/2020   TRIG 95.0 04/03/2020   HDL 62.80 04/03/2020   LDLCALC 55 04/03/2020   ALT 18 04/01/2021   AST 32 04/01/2021   NA 138 04/01/2021   K 4.1 04/01/2021   CL 101 04/01/2021   CREATININE 0.62 04/01/2021   BUN 15 04/01/2021   CO2 29 04/01/2021   TSH 2.22 03/12/2020   HGBA1C 6.1 (H) 05/14/2019    CT Abdomen Pelvis W Contrast  Result Date: 02/19/2021 CLINICAL DATA:  Generalized abdominal pain for 2 months. EXAM: CT ABDOMEN AND PELVIS WITH CONTRAST TECHNIQUE: Multidetector CT imaging of the abdomen and pelvis was performed using the standard protocol following bolus administration of intravenous contrast. CONTRAST:  41mL OMNIPAQUE IOHEXOL 300 MG/ML  SOLN COMPARISON:  CT of the abdomen and pelvis with contrast on 07/24/2019 FINDINGS: Lower chest: No acute abnormality. Hepatobiliary: No focal liver abnormality is seen. Status post  cholecystectomy. No biliary dilatation. Pancreas: Unremarkable. No pancreatic ductal dilatation or surrounding inflammatory changes. Spleen: Normal in size without focal abnormality. Adrenals/Urinary Tract: Adrenal glands are unremarkable. Kidneys are normal, without renal calculi, focal lesion, or hydronephrosis. Bladder is moderately distended with urine. Stomach/Bowel: Bowel shows no evidence of obstruction, ileus, inflammation or visible lesion. No free intraperitoneal air identified. Vascular/Lymphatic: Previously placed intravascular stent again noted in the proximal celiac axis. Stent lumen is not adequately evaluated on the venous phase of imaging but demonstrates potential neo-intimal hyperplasia within its proximal to mid segment and intrastent stenosis cannot be excluded. There is heavily calcified plaque at the origin of the superior mesenteric artery. The inferior mesenteric artery origin is difficult to identify but the IMA trunk appears to be open. Reproductive: Calcifications within a postmenopausal uterus. No adnexal masses. Other: No abdominal wall hernia or abnormality. No abdominopelvic ascites. Musculoskeletal: Prior vertebral augmentation at L4. Loss of height of L2 with approximately 50-60% compression. IMPRESSION: 1. No evidence of acute inflammatory process or gross bowel ischemia. 2. Previously placed intravascular stent in the proximal celiac axis. Stent lumen is not adequately evaluated on the venous phase of imaging but demonstrates potential neo-intimal hyperplasia within its proximal to mid segment and intrastent stenosis cannot be excluded. Consider CTA of the abdomen and pelvis for mesenteric arterial evaluation and if symptoms are suggestive of mesenteric ischemia. 3. Heavily calcified plaque at the origin of the superior mesenteric artery. The inferior mesenteric artery origin is difficult to identify but the IMA trunk appears to be open. 4. Prior vertebral augmentation at L4. Loss  of height of L2 with approximately 50-60% compression. Electronically Signed   By: [name] M.D.   On: 02/19/2021 08:24    Assessment & Plan:   Problem List Items Addressed This Visit      Unprioritized   Abdominal pain    Chronic, present for over 2 years.  Workup has included MR abd in 2019, CT angio May 2021, and vascular follow up for celiac artery stenosis s/p stenting in 2018.  thus far no vascular or GI source has been determined to be the culprit.  Patient's pain is not postprandial, more often related to position (worse when supine)  and has persisted despite daily adherence to PPI therapy.  Vascular etiology considered ; Dr Lucky Cowboy has seen her and ruled it out.   My suspicion is that her DDD and prior T11-L1 compression fractures may be causing referred pain due spinal stenosis and  foraminal stenosis , which I have suggested in the past.  However,  GI feels that GERD is the cause and has placed her back on PPI  As of today       Accelerated hypertension - Primary    Encouraged to increase imdur dose to 1/2 tablet and watch for occurrence of rash which she has attributed to her medications.         Relevant Orders   Comprehensive metabolic panel (Completed)   Angina of effort ([place])    Continue Imdur. Noninvasive workup is negative for ischemia      Anxiety about health    Her anxiety is due to worry about becoming  a burden to her daughters.       Atherosclerosis of abdominal aorta [facility])    Reviewed findings of prior CT scan today..  Patient is tolerating high potency statin therapy       Celiac artery stenosis (HCC)    Stable by serial imaging including mesenteric artery duplex        Other Visit Diagnoses    Do not resuscitate status       Relevant Orders   DNR (Do Not Resuscitate)     A total of 40 minutes was spent with patient more than half of which was spent in counseling patient on her anxiety, hypertension and abdominal pain,  reviewing and explaining  recent labs and imaging studies done, and coordination of care.   I am having [name] maintain her Vitamin D3, multivitamin, Omega-3 Fatty Acids (OMEGA 3 PO), Vitamin C CR, Lactobacillus (PROBIOTIC ACIDOPHILUS PO), Glucosamine Sulfate, vitamin B-12, CINNAMON PO, Turmeric, acetaminophen, Magnesium, loratadine, Ginkgo Biloba (GNP GINGKO BILOBA EXTRACT PO), Hydrocortisone Ace-Pramoxine, fluticasone, clotrimazole-betamethasone, diclofenac sodium, Alfalfa, aspirin, nitroGLYCERIN, metoprolol succinate, isosorbide mononitrate, Calcium Carbonate (CALCIUM 600 PO), mupirocin ointment, hydrALAZINE, atorvastatin, and omeprazole.  No orders of the defined types were placed in this encounter.   There are no discontinued medications.  Follow-up: Return in about 4 weeks (around 04/29/2021).   Crecencio Mc, MD

## 2021-04-02 DIAGNOSIS — F418 Other specified anxiety disorders: Secondary | ICD-10-CM | POA: Insufficient documentation

## 2021-04-02 LAB — COMPREHENSIVE METABOLIC PANEL
ALT: 18 U/L (ref 0–35)
AST: 32 U/L (ref 0–37)
Albumin: 4.3 g/dL (ref 3.5–5.2)
Alkaline Phosphatase: 49 U/L (ref 39–117)
BUN: 15 mg/dL (ref 6–23)
CO2: 29 mEq/L (ref 19–32)
Calcium: 9.4 mg/dL (ref 8.4–10.5)
Chloride: 101 mEq/L (ref 96–112)
Creatinine, Ser: 0.62 mg/dL (ref 0.40–1.20)
GFR: 77.84 mL/min (ref >60.00)
Glucose, Bld: 154 mg/dL — ABNORMAL HIGH (ref 70–99)
Potassium: 4.1 mEq/L (ref 3.5–5.1)
Sodium: 138 mEq/L (ref 135–145)
Total Bilirubin: 0.4 mg/dL (ref 0.2–1.2)
Total Protein: 6.6 g/dL (ref 6.0–8.3)

## 2021-04-02 NOTE — Assessment & Plan Note (Signed)
Encouraged to increase imdur dose to 1/2 tablet and watch for occurrence of rash which she has attributed to her medications.

## 2021-04-02 NOTE — Assessment & Plan Note (Signed)
Her anxiety is due to worry about becoming  a burden to her daughters.

## 2021-04-02 NOTE — Assessment & Plan Note (Signed)
Reviewed findings of prior CT scan today..  Patient is tolerating high potency statin therapy  

## 2021-04-02 NOTE — Assessment & Plan Note (Signed)
Stable by serial imaging including mesenteric artery duplex

## 2021-04-02 NOTE — Assessment & Plan Note (Signed)
Chronic, present for over 2 years.  Workup has included MR abd in 2019, CT angio May 2021, and vascular follow up for celiac artery stenosis s/p stenting in 2018.  thus far no vascular or GI source has been determined to be the culprit.  Patient's pain is not postprandial, more often related to position (worse when supine) and has persisted despite daily adherence to PPI therapy.  Vascular etiology considered ; Dr Lucky Cowboy has seen her and ruled it out.   My suspicion is that her DDD and prior T11-L1 compression fractures may be causing referred pain due spinal stenosis and  foraminal stenosis , which I have suggested in the past.  However,  GI feels that GERD is the cause and has placed her back on PPI  As of today

## 2021-04-02 NOTE — Assessment & Plan Note (Signed)
Continue Imdur. Noninvasive workup is negative for ischemia

## 2021-04-03 DIAGNOSIS — B301 Conjunctivitis due to adenovirus: Secondary | ICD-10-CM | POA: Diagnosis not present

## 2021-04-21 ENCOUNTER — Other Ambulatory Visit: Payer: Self-pay | Admitting: Internal Medicine

## 2021-05-05 ENCOUNTER — Other Ambulatory Visit: Payer: Self-pay | Admitting: *Deleted

## 2021-05-06 ENCOUNTER — Ambulatory Visit (INDEPENDENT_AMBULATORY_CARE_PROVIDER_SITE_OTHER): Payer: Medicare Other

## 2021-05-06 VITALS — Ht 60.0 in | Wt 101.0 lb

## 2021-05-06 DIAGNOSIS — Z0000 Encounter for general adult medical examination without abnormal findings: Secondary | ICD-10-CM

## 2021-05-06 NOTE — Progress Notes (Addendum)
Subjective:   [name] is a 87 y.o. female who presents for Medicare Annual (Subsequent) preventive examination.  Review of Systems    No ROS.  Medicare Wellness Virtual Visit.  Visual/audio telehealth visit, UTA vital signs.   See social history for additional risk factors.   Cardiac Risk Factors include: advanced age (>47men, >41 women)     Objective:    Today's Vitals   05/06/21 0950  Weight: 101 lb (45.8 kg)  Height: 5' (1.524 m)   Body mass index is 19.73 kg/m.  Advanced Directives 05/06/2021 05/22/2020 05/05/2020 03/26/2020 03/25/2020 05/03/2019 04/28/2018  Does Patient Have a Medical Advance Directive? Yes Yes Yes Yes Yes Yes Yes  Type of Paramedic of [place];Living will - [place];Living will Healthcare Power of [place] of [place];Living will [place];Living will  Does patient want to make changes to medical advance directive? No - Patient declined No - Patient declined No - Patient declined No - Patient declined - - -  Copy of [place] in Chart? Yes - validated most recent copy scanned in chart (See row information) - Yes - validated most recent copy scanned in chart (See row information) No - copy requested - Yes - validated most recent copy scanned in chart (See row information) Yes    Current Medications (verified) Outpatient Encounter Medications as of 05/06/2021  Medication Sig   acetaminophen (TYLENOL) 500 MG tablet Take 500 mg by mouth every 6 (six) hours as needed for mild pain.   Alfalfa 500 MG TABS Take 500 mg by mouth daily.    Ascorbic Acid (VITAMIN C CR) 500 MG CPCR Take 500 mg by mouth daily.   aspirin EC 81 MG EC tablet Take 1 tablet (81 mg total) by mouth every evening.   atorvastatin (LIPITOR) 10 MG tablet TAKE 1 TABLET BY MOUTH ONCE DAILY IN THE EVENING   Calcium Carbonate (CALCIUM 600 PO) Take by mouth.    Cholecalciferol (VITAMIN D3) 3000 UNITS TABS Take 3,000 Units by mouth 2 (two) times daily.    CINNAMON PO Take 1,000 mg by mouth daily.    clotrimazole-betamethasone (LOTRISONE) cream Apply topically 2 (two) times daily. (Patient taking differently: Apply topically 2 (two) times daily as needed.)   diclofenac sodium (VOLTAREN) 1 % GEL Apply topically as needed.    fluticasone (FLONASE) 50 MCG/ACT nasal spray Place 1 spray into both nostrils daily.   Ginkgo Biloba (GNP GINGKO BILOBA EXTRACT PO) Take 1 tablet by mouth daily.   Glucosamine Sulfate 500 MG CAPS Take 500 mg by mouth daily.   hydrALAZINE (APRESOLINE) 10 MG tablet Take 1 tablet (10 mg total) by mouth 3 (three) times daily. As needed for sbp  170 or higher   isosorbide mononitrate (IMDUR) 30 MG 24 hr tablet Take 0.5 tablets (15 mg total) by mouth daily.   Lactobacillus (PROBIOTIC ACIDOPHILUS PO) Take 1 capsule by mouth daily.    loratadine (CLARITIN) 10 MG tablet Take 10 mg by mouth daily as needed for allergies.   Magnesium 400 MG CAPS Take 400 mg by mouth daily.   metoprolol succinate (TOPROL XL) 25 MG 24 hr tablet Take 0.5 tablets (12.5 mg total) by mouth daily.   Multiple Vitamin (MULTIVITAMIN) capsule Take 1 capsule by mouth daily.   mupirocin ointment (BACTROBAN) 2 % Apply to right leg wound daily - a small dab as needed once a day.   nitroGLYCERIN (NITROSTAT) 0.4  MG SL tablet Place 1 tablet (0.4 mg total) under the tongue every 5 (five) minutes as needed for chest pain.   Omega-3 Fatty Acids (OMEGA 3 PO) Take 520 mg by mouth daily.    omeprazole (PRILOSEC) 20 MG capsule Take 1 capsule (20 mg total) by mouth daily.   Pramoxine-HC (HYDROCORTISONE ACE-PRAMOXINE) 2.5-1 % CREA Apply topically as needed.    Turmeric 450 MG CAPS Take 450 Units by mouth daily.    vitamin B-12 (CYANOCOBALAMIN) 1000 MCG tablet Take 1,000 mcg by mouth daily.   No facility-administered encounter medications on file as of 05/06/2021.    Allergies  (verified) Ibandronic acid, Other, Prednisone, Actonel [risedronate], Amoxicillin, Pantoprazole, Raloxifene, Versed [midazolam], and Doxycycline   History: Past Medical History:  Diagnosis Date   Allergy    Arthritis    Atherosclerosis    Atherosclerosis of abdominal aorta (HCC)    Chronic kidney disease    stage 2   CKD (chronic kidney disease) stage 2, GFR 60-89 ml/min    Collagen vascular disease ([place])    Diverticulosis 2008   GERD (gastroesophageal reflux disease)    Heart murmur    Hyperlipidemia    Lumbar spinal stenosis    Osteoporosis    Ovarian lump    RA (rheumatoid arthritis) ([place])    Raynaud's disease    Rheumatoid arthritis ([place])    Spinal stenosis    Past Surgical History:  Procedure Laterality Date   BREAST SURGERY Left 1965   Benign biopsy   CHOLECYSTECTOMY  2005   [place]   COLONOSCOPY  2008   Dr. Donnella Sham   EXCISION OF BREAST BIOPSY Left 1965   benign   EYE SURGERY Bilateral    Cataract Extraction with IOL   KYPHOPLASTY N/A 12/17/2016   Procedure: KYPHOPLASTY;  Surgeon: Hessie Knows, MD;  Location: [facility] ORS;  Service: Orthopedics;  Laterality: N/A;  l4   LUMBAR LAMINECTOMY  05/09/2017   L2-L5   VISCERAL ANGIOGRAPHY N/A 08/15/2017   Procedure: VISCERAL ANGIOGRAPHY;  Surgeon: Algernon Huxley, MD;  Location: [place] [facility];  Service: Cardiovascular;  Laterality: N/A;   VISCERAL ANGIOGRAPHY N/A 10/05/2017   Procedure: VISCERAL ANGIOGRAPHY;  Surgeon: Algernon Huxley, MD;  Location: [place] [facility];  Service: Cardiovascular;  Laterality: N/A;   VISCERAL ARTERY INTERVENTION N/A 08/15/2017   Procedure: VISCERAL ARTERY INTERVENTION;  Surgeon: Algernon Huxley, MD;  Location: [place] [facility];  Service: Cardiovascular;  Laterality: N/A;   Family History  Problem Relation Age of Onset   Heart disease Mother    Hypertension Father    Heart disease Brother 38       heart attack   Cancer Brother        bladder   Diabetes Sister    Alcohol abuse Sister     Kidney disease Sister    Diabetes Brother    Stroke Brother    Diabetes Maternal Grandmother    Heart disease Maternal Grandmother    Cancer Brother        lung   Social History   Socioeconomic History   Marital status: Widowed    Spouse name: Gwyndolyn Saxon   Number of children: 3   Years of education: some college   Highest education level: 12th grade  Occupational History    Employer: RETIRED  Tobacco Use   Smoking status: Never   Smokeless tobacco: Never   Tobacco comments:    smoking cessation materials not required  Vaping Use   Vaping Use:  Never used  Substance and Sexual Activity   Alcohol use: No    Alcohol/week: 0.0 standard drinks   Drug use: No   Sexual activity: Not Currently  Other Topics Concern   Not on file  Social History Narrative   Not on file   Social Determinants of Health   Financial Resource Strain: Low Risk    Difficulty of Paying Living Expenses: Not hard at all  Food Insecurity: No Food Insecurity   Worried About Charity fundraiser in the Last Year: Never true   [place] in the Last Year: Never true  Transportation Needs: No Transportation Needs   Lack of Transportation (Medical): No   Lack of Transportation (Non-Medical): No  Physical Activity: Sufficiently Active   Days of Exercise per Week: 7 days   Minutes of Exercise per Session: 30 min  Stress: No Stress Concern Present   Feeling of Stress : Not at all  Social Connections: Moderately Integrated   Frequency of Communication with Friends and Family: More than three times a week   Frequency of Social Gatherings with Friends and Family: Three times a week   Attends Religious Services: More than 4 times per year   Active Member of Clubs or Organizations: Yes   Attends Archivist Meetings: More than 4 times per year   Marital Status: Widowed    Tobacco Counseling Counseling given: Not Answered Tobacco comments: smoking cessation materials not required   Clinical  Intake:  Pre-visit preparation completed: Yes        Diabetes: No  How often do you need to have someone help you when you read instructions, pamphlets, or other written materials from your doctor or pharmacy?: 1 - Never  Interpreter Needed?: No      Activities of Daily Living In your present state of health, do you have any difficulty performing the following activities: 05/06/2021  Hearing? Y  Comment Hearing aids  Vision? N  Difficulty concentrating or making decisions? N  Walking or climbing stairs? N  Comment Paces self.  Dressing or bathing? N  Doing errands, shopping? N  Preparing Food and eating ? N  Using the Toilet? N  In the past six months, have you accidently leaked urine? Y  Comment Managed with daily brief/pad. Followed by PCP.  Do you have problems with loss of bowel control? N  Managing your Medications? N  Managing your Finances? N  Housekeeping or managing your Housekeeping? N  Some recent data might be hidden    Patient Care Team: Crecencio Mc, MD as PCP - General (Internal Medicine) Dingeldein, Remo Lipps, MD (Ophthalmology) Clyde Canterbury, MD as Referring Physician (Otolaryngology) Corey Skains, MD as Consulting Physician (Cardiology) Oneta Rack, MD as Consulting Physician (Dermatology) Lucky Cowboy Erskine Squibb, MD as Consulting Physician (Vascular Surgery)  Indicate any recent Medical Services you may have received from other than [facility] providers in the past year (date may be approximate).     Assessment:   This is a routine wellness examination for Malvina.  I connected with Waynetta today by telephone and verified that I am speaking with the correct person using two identifiers. Location patient: home Location provider: work Persons participating in the virtual visit: patient, Marine scientist.    I discussed the limitations, risks, security and privacy concerns of performing an evaluation and management service by telephone and the availability of in  person appointments. The patient expressed understanding and verbally consented to this telephonic visit.  Interactive audio and video telecommunications were attempted between this provider and patient, however failed, due to patient having technical difficulties OR patient did not have access to video capability.  We continued and completed visit with audio only.  Some vital signs may be absent or patient reported.   Hearing/Vision screen Hearing Screening - Comments:: Hearing aids Vision Screening - Comments:: Followed by [facility], Dr. Jeni Salles.  Wears corrective lenses. They have seen their ophthalmologist in the last 12 months.   Dietary issues and exercise activities discussed: Current Exercise Habits: Home exercise routine, Intensity: Mild   Goals Addressed             This Visit's Progress    Maintain Healthy Lifestyle       Stay active Stay hydrated        Depression Screen PHQ 2/9 Scores 05/06/2021 04/01/2021 05/05/2020 09/19/2019 05/14/2019 05/03/2019 03/14/2019  PHQ - 2 Score 0 0 0 0 0 1 0  PHQ- 9 Score 0 2 - - 0 3 0    Fall Risk Fall Risk  05/06/2021 04/01/2021 09/16/2020 05/05/2020 04/03/2020  Falls in the past year? 0 0 1 1 1   Comment - - - None since last reported 2 weeks ago -  Number falls in past yr: 0 - 0 - 0  Injury with Fall? 0 - 0 - 1  Risk for fall due to : - - - - History of fall(s)  Risk for fall due to: Comment - - - - -  Follow up Falls evaluation completed Falls evaluation completed Falls evaluation completed Falls evaluation completed Falls evaluation completed    [place]: Handrails in use when climbing stairs? Yes Home free of loose throw rugs in walkways, pet beds, electrical cords, etc? Yes  Adequate lighting in your home to reduce risk of falls? Yes   ASSISTIVE DEVICES UTILIZED TO PREVENT FALLS: Life alert? Yes  Use of a cane, walker or w/c? No   TIMED UP AND GO: Was the test performed? No  . Virtual visit.   Cognitive Function:  Patient is alert and oriented x3.     6CIT Screen 05/05/2020 05/03/2019 04/28/2018 04/19/2017  What Year? 0 points 0 points 0 points 0 points  What month? 0 points 0 points 0 points 0 points  What time? - 0 points 0 points 0 points  Count back from 20 - 0 points 0 points 0 points  Months in reverse 0 points 0 points 0 points 0 points  Repeat phrase - 0 points 2 points 0 points  Total Score - 0 2 0    Immunizations Immunization History  Administered Date(s) Administered   Fluad Quad(high Dose 65+) 08/07/2019, 08/26/2020   Influenza, High Dose Seasonal PF 09/12/2018   Influenza,inj,Quad PF,6+ Mos 08/15/2015   Influenza-Unspecified 08/20/2011, 08/11/2012, 09/08/2013, 09/05/2014, 08/15/2015, 09/13/2016, 08/25/2017   PFIZER(Purple Top)SARS-COV-2 Vaccination 12/04/2019, 12/25/2019, 10/17/2020   Pneumococcal Conjugate-13 12/13/2014   Pneumococcal Polysaccharide-23 11/08/2000, 08/08/2006   Tdap 11/08/2008, 03/14/2019   Zoster, Live 01/07/2004, 11/08/2008   Covid vaccine- 3 vaccine received.  Shingrix- deferred per patient.   Health Maintenance Health Maintenance  Topic Date Due   Zoster Vaccines- Shingrix (1 of 2) 08/06/2021 (Originally 10/24/1977)   COVID-19 Vaccine (4 - Booster for [place] series) 11/06/2021 (Originally 02/15/2021)   INFLUENZA VACCINE  06/08/2021   TETANUS/TDAP  03/13/2029   DEXA SCAN  Completed   PNA vac Low Risk Adult  Completed   HPV VACCINES  Aged Out  Colorectal cancer screening: No longer required.   Mammogram- deferred per patient. Considering a conversation with pcp due hx dense tissue.   DG Chest 2 View: 03/25/20.   Hepatitis C Screening: does not qualify.  Vision Screening: Recommended annual ophthalmology exams for early detection of glaucoma and other disorders of the eye.  Dental Screening: Recommended annual dental exams for proper oral hygiene.  Community Resource Referral / Chronic Care  Management: CRR required this visit?  No   CCM required this visit?  No      Plan:   Keep all routine maintenance appointments.   I have personally reviewed and noted the following in the patient's chart:   Medical and social history Use of alcohol, tobacco or illicit drugs  Current medications and supplements including opioid prescriptions. Patient is not currently taking opioid.  Functional ability and status Nutritional status Physical activity Advanced directives List of other physicians Hospitalizations, surgeries, and ER visits in previous 12 months Vitals Screenings to include cognitive, depression, and falls Referrals and appointments  In addition, I have reviewed and discussed with patient certain preventive protocols, quality metrics, and best practice recommendations. A written personalized care plan for preventive services as well as general preventive health recommendations were provided to patient.     OBrien-Blaney, Ayen Viviano L, LPN   [id number]/73/[id number]     I have reviewed the above information and agree with above.   Deborra Medina, MD

## 2021-05-06 NOTE — Patient Instructions (Addendum)
[name] , Thank you for taking time to come for your Medicare Wellness Visit. I appreciate your ongoing commitment to your health goals. Please review the following plan we discussed and let me know if I can assist you in the future.   These are the goals we discussed:  Goals      DIET - INCREASE WATER INTAKE     Stay hydrated      Maintain Healthy Lifestyle     Stay active Stay hydrated         This is a list of the screening recommended for you and due dates:  Health Maintenance  Topic Date Due   Zoster (Shingles) Vaccine (1 of 2) 08/06/2021*   COVID-19 Vaccine (4 - Booster for Pfizer series) 11/06/2021*   Flu Shot  06/08/2021   Tetanus Vaccine  03/13/2029   DEXA scan (bone density measurement)  Completed   Pneumonia vaccines  Completed   HPV Vaccine  Aged Out  *Topic was postponed. The date shown is not the original due date.    Advanced directives: on file  Conditions/risks identified: none new  Follow up in one year for your annual wellness visit    Preventive Care 65 Years and Older, Female Preventive care refers to lifestyle choices and visits with your health care provider that can promote health and wellness. What does preventive care include? A yearly physical exam. This is also called an annual well check. Dental exams once or twice a year. Routine eye exams. Ask your health care provider how often you should have your eyes checked. Personal lifestyle choices, including: Daily care of your teeth and gums. Regular physical activity. Eating a healthy diet. Avoiding tobacco and drug use. Limiting alcohol use. Practicing safe sex. Taking low-dose aspirin every day. Taking vitamin and mineral supplements as recommended by your health care provider. What happens during an annual well check? The services and screenings done by your health care provider during your annual well check will depend on your age, overall health, lifestyle risk factors, and  family history of disease. Counseling  Your health care provider may ask you questions about your: Alcohol use. Tobacco use. Drug use. Emotional well-being. Home and relationship well-being. Sexual activity. Eating habits. History of falls. Memory and ability to understand (cognition). Work and work Statistician. Reproductive health. Screening  You may have the following tests or measurements: Height, weight, and BMI. Blood pressure. Lipid and cholesterol levels. These may be checked every 5 years, or more frequently if you are over 42 years old. Skin check. Lung cancer screening. You may have this screening every year starting at age 42 if you have a 30-pack-year history of smoking and currently smoke or have quit within the past 15 years. Fecal occult blood test (FOBT) of the stool. You may have this test every year starting at age 47. Flexible sigmoidoscopy or colonoscopy. You may have a sigmoidoscopy every 5 years or a colonoscopy every 10 years starting at age 59. Hepatitis C blood test. Hepatitis B blood test. Sexually transmitted disease (STD) testing. Diabetes screening. This is done by checking your blood sugar (glucose) after you have not eaten for a while (fasting). You may have this done every 1-3 years. Bone density scan. This is done to screen for osteoporosis. You may have this done starting at age 4. Mammogram. This may be done every 1-2 years. Talk to your health care provider about how often you should have regular mammograms. Talk with your health care provider  about your test results, treatment options, and if necessary, the need for more tests. Vaccines  Your health care provider may recommend certain vaccines, such as: Influenza vaccine. This is recommended every year. Tetanus, diphtheria, and acellular pertussis (Tdap, Td) vaccine. You may need a Td booster every 10 years. Zoster vaccine. You may need this after age 79. Pneumococcal 13-valent conjugate  (PCV13) vaccine. One dose is recommended after age 73. Pneumococcal polysaccharide (PPSV23) vaccine. One dose is recommended after age 84. Talk to your health care provider about which screenings and vaccines you need and how often you need them. This information is not intended to replace advice given to you by your health care provider. Make sure you discuss any questions you have with your health care provider. Document Released: 11/21/2015 Document Revised: 07/14/2016 Document Reviewed: 08/26/2015 Elsevier Interactive Patient Education  2017 [place] Prevention in the Home Falls can cause injuries. They can happen to people of all ages. There are many things you can do to make your home safe and to help prevent falls. What can I do on the outside of my home? Regularly fix the edges of walkways and driveways and fix any cracks. Remove anything that might make you trip as you walk through a door, such as a raised step or threshold. Trim any bushes or trees on the path to your home. Use bright outdoor lighting. Clear any walking paths of anything that might make someone trip, such as rocks or tools. Regularly check to see if handrails are loose or broken. Make sure that both sides of any steps have handrails. Any raised decks and porches should have guardrails on the edges. Have any leaves, snow, or ice cleared regularly. Use sand or salt on walking paths during winter. Clean up any spills in your garage right away. This includes oil or grease spills. What can I do in the bathroom? Use night lights. Install grab bars by the toilet and in the tub and shower. Do not use towel bars as grab bars. Use non-skid mats or decals in the tub or shower. If you need to sit down in the shower, use a plastic, non-slip stool. Keep the floor dry. Clean up any water that spills on the floor as soon as it happens. Remove soap buildup in the tub or shower regularly. Attach bath mats securely with  double-sided non-slip rug tape. Do not have throw rugs and other things on the floor that can make you trip. What can I do in the bedroom? Use night lights. Make sure that you have a light by your bed that is easy to reach. Do not use any sheets or blankets that are too big for your bed. They should not hang down onto the floor. Have a firm chair that has side arms. You can use this for support while you get dressed. Do not have throw rugs and other things on the floor that can make you trip. What can I do in the kitchen? Clean up any spills right away. Avoid walking on wet floors. Keep items that you use a lot in easy-to-reach places. If you need to reach something above you, use a strong step stool that has a grab bar. Keep electrical cords out of the way. Do not use floor polish or wax that makes floors slippery. If you must use wax, use non-skid floor wax. Do not have throw rugs and other things on the floor that can make you trip. What can I do  with my stairs? Do not leave any items on the stairs. Make sure that there are handrails on both sides of the stairs and use them. Fix handrails that are broken or loose. Make sure that handrails are as long as the stairways. Check any carpeting to make sure that it is firmly attached to the stairs. Fix any carpet that is loose or worn. Avoid having throw rugs at the top or bottom of the stairs. If you do have throw rugs, attach them to the floor with carpet tape. Make sure that you have a light switch at the top of the stairs and the bottom of the stairs. If you do not have them, ask someone to add them for you. What else can I do to help prevent falls? Wear shoes that: Do not have high heels. Have rubber bottoms. Are comfortable and fit you well. Are closed at the toe. Do not wear sandals. If you use a stepladder: Make sure that it is fully opened. Do not climb a closed stepladder. Make sure that both sides of the stepladder are locked  into place. Ask someone to hold it for you, if possible. Clearly mark and make sure that you can see: Any grab bars or handrails. First and last steps. Where the edge of each step is. Use tools that help you move around (mobility aids) if they are needed. These include: Canes. Walkers. Scooters. Crutches. Turn on the lights when you go into a dark area. Replace any light bulbs as soon as they burn out. Set up your furniture so you have a clear path. Avoid moving your furniture around. If any of your floors are uneven, fix them. If there are any pets around you, be aware of where they are. Review your medicines with your doctor. Some medicines can make you feel dizzy. This can increase your chance of falling. Ask your doctor what other things that you can do to help prevent falls. This information is not intended to replace advice given to you by your health care provider. Make sure you discuss any questions you have with your health care provider. Document Released: 08/21/2009 Document Revised: 04/01/2016 Document Reviewed: 11/29/2014 Elsevier Interactive Patient Education  2017 [employer].

## 2021-05-07 MED ORDER — METOPROLOL SUCCINATE ER 25 MG PO TB24: 12.5000 mg | ORAL_TABLET | Freq: Every day | ORAL | 0 refills | Status: DC

## 2021-05-07 MED ORDER — ISOSORBIDE MONONITRATE ER 30 MG PO TB24: 15.0000 mg | ORAL_TABLET | Freq: Every day | ORAL | 0 refills | Status: DC

## 2021-05-07 NOTE — Telephone Encounter (Signed)
LMOV  

## 2021-05-13 ENCOUNTER — Other Ambulatory Visit: Payer: Self-pay

## 2021-05-13 ENCOUNTER — Ambulatory Visit (INDEPENDENT_AMBULATORY_CARE_PROVIDER_SITE_OTHER): Payer: Medicare Other | Admitting: Gastroenterology

## 2021-05-13 VITALS — BP 172/76 | HR 60 | Temp 97.7°F | Wt 102.0 lb

## 2021-05-13 DIAGNOSIS — K5900 Constipation, unspecified: Secondary | ICD-10-CM | POA: Diagnosis not present

## 2021-05-13 DIAGNOSIS — R1013 Epigastric pain: Secondary | ICD-10-CM | POA: Diagnosis not present

## 2021-05-13 MED ORDER — OMEPRAZOLE 20 MG PO CPDR: 20.0000 mg | DELAYED_RELEASE_CAPSULE | Freq: Every day | ORAL | 0 refills | Status: DC

## 2021-05-13 NOTE — Progress Notes (Signed)
[name], MD [address]  [place]  [place], [place] [address]  Main: [phone]  Fax: [phone]   Primary Care Physician: Crecencio Mc, MD   Chief complaint: Abdominal pain  HPI: [name] is a 87 y.o. female here for follow-up of epigastric pain.  Patient describes that the pain is better since her last visit, but continues to have occasional episodes, about 2-3 times a month, specifically when laying down and sleeping.  The pain wakes her up from sleep.  She states she drinks milk and this relieves the pain.  She has been taking omeprazole once a day 30 minutes before breakfast.  No dysphagia.  No nausea or vomiting.  Good appetite.  No weight loss.  Has been drinking Metamucil daily and with this reports 1 soft bowel movement daily without straining.  States this is better since last visit as well.   ROS: All ROS reviewed and negative except as per HPI   Past Medical History:  Diagnosis Date   Allergy    Arthritis    Atherosclerosis    Atherosclerosis of abdominal aorta (HCC)    Chronic kidney disease    stage 2   CKD (chronic kidney disease) stage 2, GFR 60-89 ml/min    Collagen vascular disease ([place])    Diverticulosis 2008   GERD (gastroesophageal reflux disease)    Heart murmur    Hyperlipidemia    Lumbar spinal stenosis    Osteoporosis    Ovarian lump    RA (rheumatoid arthritis) ([place])    Raynaud's disease    Rheumatoid arthritis ([place])    Spinal stenosis     Past Surgical History:  Procedure Laterality Date   BREAST SURGERY Left 1965   Benign biopsy   CHOLECYSTECTOMY  2005   [place]   COLONOSCOPY  2008   Dr. Donnella Sham   EXCISION OF BREAST BIOPSY Left 1965   benign   EYE SURGERY Bilateral    Cataract Extraction with IOL   KYPHOPLASTY N/A 12/17/2016   Procedure: KYPHOPLASTY;  Surgeon: Hessie Knows, MD;  Location: [facility];  Service: Orthopedics;  Laterality: N/A;  l4   LUMBAR LAMINECTOMY  05/09/2017   L2-L5    VISCERAL ANGIOGRAPHY N/A 08/15/2017   Procedure: VISCERAL ANGIOGRAPHY;  Surgeon: Algernon Huxley, MD;  Location: [facility];  Service: Cardiovascular;  Laterality: N/A;   VISCERAL ANGIOGRAPHY N/A 10/05/2017   Procedure: VISCERAL ANGIOGRAPHY;  Surgeon: Algernon Huxley, MD;  Location: [facility];  Service: Cardiovascular;  Laterality: N/A;   VISCERAL ARTERY INTERVENTION N/A 08/15/2017   Procedure: VISCERAL ARTERY INTERVENTION;  Surgeon: Algernon Huxley, MD;  Location: [facility];  Service: Cardiovascular;  Laterality: N/A;    Prior to Admission medications   Medication Sig Start Date End Date Taking? Authorizing Provider  acetaminophen (TYLENOL) 500 MG tablet Take 500 mg by mouth every 6 (six) hours as needed for mild pain.   Yes [provider]  Alfalfa 500 MG TABS Take 500 mg by mouth daily.    Yes [provider]  Ascorbic Acid (VITAMIN C CR) 500 MG CPCR Take 500 mg by mouth daily.   Yes [provider]  aspirin EC 81 MG EC tablet Take 1 tablet (81 mg total) by mouth every evening. 03/27/20  Yes Lavina Hamman, MD  atorvastatin (LIPITOR) 10 MG tablet TAKE 1 TABLET BY MOUTH ONCE DAILY IN THE EVENING 04/21/21  Yes Crecencio Mc, MD  Calcium  Carbonate (CALCIUM 600 PO) Take by mouth.   Yes [provider]  Cholecalciferol (VITAMIN D3) 3000 UNITS TABS Take 3,000 Units by mouth 2 (two) times daily.    Yes [provider]  CINNAMON PO Take 1,000 mg by mouth daily.    Yes [provider]  clotrimazole-betamethasone (LOTRISONE) cream Apply topically 2 (two) times daily. Patient taking differently: Apply topically 2 (two) times daily as needed. 03/14/19  Yes Poulose, Bethel Born, NP  diclofenac sodium (VOLTAREN) 1 % GEL Apply topically as needed.  08/02/19  Yes [provider]  fluticasone (FLONASE) 50 MCG/ACT nasal spray Place 1 spray into both nostrils daily.   Yes [provider]  Ginkgo Biloba (GNP GINGKO BILOBA  EXTRACT PO) Take 1 tablet by mouth daily.   Yes [provider]  Glucosamine Sulfate 500 MG CAPS Take 500 mg by mouth daily.   Yes [provider]  hydrALAZINE (APRESOLINE) 10 MG tablet Take 1 tablet (10 mg total) by mouth 3 (three) times daily. As needed for sbp  170 or higher 09/16/20  Yes Crecencio Mc, MD  isosorbide mononitrate (IMDUR) 30 MG 24 hr tablet Take 0.5 tablets (15 mg total) by mouth daily. 05/07/21  Yes Agbor-Etang, Aaron Edelman, MD  Lactobacillus (PROBIOTIC ACIDOPHILUS PO) Take 1 capsule by mouth daily.    Yes [provider]  loratadine (CLARITIN) 10 MG tablet Take 10 mg by mouth daily as needed for allergies.   Yes [provider]  Magnesium 400 MG CAPS Take 400 mg by mouth daily.   Yes [provider]  metoprolol succinate (TOPROL XL) 25 MG 24 hr tablet Take 0.5 tablets (12.5 mg total) by mouth daily. 05/07/21 05/07/22 Yes Agbor-Etang, Aaron Edelman, MD  Multiple Vitamin (MULTIVITAMIN) capsule Take 1 capsule by mouth daily.   Yes [provider]  mupirocin ointment (BACTROBAN) 2 % Apply to right leg wound daily - a small dab as needed once a day. 08/26/20  Yes Marval Regal, NP  nitroGLYCERIN (NITROSTAT) 0.4 MG SL tablet Place 1 tablet (0.4 mg total) under the tongue every 5 (five) minutes as needed for chest pain. 04/22/20  Yes Crecencio Mc, MD  Omega-3 Fatty Acids (OMEGA 3 PO) Take 520 mg by mouth daily.    Yes [provider]  Pramoxine-HC (HYDROCORTISONE ACE-PRAMOXINE) 2.5-1 % CREA Apply topically as needed.    Yes [provider]  Turmeric 450 MG CAPS Take 450 Units by mouth daily.    Yes [provider]  vitamin B-12 (CYANOCOBALAMIN) 1000 MCG tablet Take 1,000 mcg by mouth daily.   Yes [provider]  omeprazole (PRILOSEC) 20 MG capsule Take 1 capsule (20 mg total) by mouth daily. 30 minutes before dinner 05/13/21   Virgel Manifold, MD    Family History  Problem Relation Age of Onset    Heart disease Mother    Hypertension Father    Heart disease Brother 12       heart attack   Cancer Brother        bladder   Diabetes Sister    Alcohol abuse Sister    Kidney disease Sister    Diabetes Brother    Stroke Brother    Diabetes Maternal Grandmother    Heart disease Maternal Grandmother    Cancer Brother        lung     Social History   Tobacco Use   Smoking status: Never   Smokeless tobacco: Never   Tobacco comments:  smoking cessation materials not required  Vaping Use   Vaping Use: Never used  Substance Use Topics   Alcohol use: No    Alcohol/week: 0.0 standard drinks   Drug use: No    Allergies as of 05/13/2021 - Review Complete 05/13/2021  Allergen Reaction Noted   Ibandronic acid Other (See Comments) 08/14/2015   Other Other (See Comments) 01/19/2012   Prednisone Rash 12/26/2014   Actonel [risedronate] Other (See Comments) 06/19/2014   Amoxicillin Hives 04/15/2020   Pantoprazole Other (See Comments) 08/11/2020   Raloxifene Other (See Comments) 08/14/2015   Versed [midazolam] Other (See Comments) 12/16/2016   Doxycycline Rash 12/26/2014    Physical Examination:  Constitutional: General:   Alert,  Well-developed, well-nourished, pleasant and cooperative in NAD BP (!) 172/76   Pulse 60   Temp 97.7 F (36.5 C) (Oral)   Wt 102 lb (46.3 kg)   BMI 19.92 kg/m   Respiratory: Normal respiratory effort  Gastrointestinal:  Soft, non-tender and non-distended without masses, hepatosplenomegaly or hernias noted.  No guarding or rebound tenderness.     Cardiac: No clubbing or edema.  No cyanosis. Normal posterior tibial pedal pulses noted.  Psych:  Alert and cooperative. Normal mood and affect.  Musculoskeletal:  Normal gait. Head normocephalic, atraumatic. Symmetrical without gross deformities. 5/5 Lower extremity strength bilaterally.  Skin: Warm. Intact without significant lesions or rashes. No jaundice.  Neck: Supple, trachea  midline  Lymph: No cervical lymphadenopathy  Psych:  Alert and oriented x3, Alert and cooperative. Normal mood and affect.  Labs: CMP     Component Value Date/Time   NA 138 04/01/2021 1528   NA 143 12/19/2015 1422   K 4.1 04/01/2021 1528   CL 101 04/01/2021 1528   CO2 29 04/01/2021 1528   GLUCOSE 154 (H) 04/01/2021 1528   BUN 15 04/01/2021 1528   BUN 15 12/19/2015 1422   CREATININE 0.62 04/01/2021 1528   CREATININE 0.62 01/26/2019 1542   CALCIUM 9.4 04/01/2021 1528   PROT 6.6 04/01/2021 1528   PROT 6.2 12/19/2015 1422   ALBUMIN 4.3 04/01/2021 1528   ALBUMIN 4.0 12/19/2015 1422   AST 32 04/01/2021 1528   ALT 18 04/01/2021 1528   ALKPHOS 49 04/01/2021 1528   BILITOT 0.4 04/01/2021 1528   BILITOT <0.2 12/19/2015 1422   GFRNONAA >60 05/22/2020 1536   GFRNONAA 80 01/26/2019 1542   GFRAA >60 05/22/2020 1536   GFRAA 93 01/26/2019 1542   Lab Results  Component Value Date   WBC 5.9 05/22/2020   HGB 13.1 05/22/2020   HCT 40.0 05/22/2020   MCV 88.5 05/22/2020   PLT 212 05/22/2020    Imaging Studies:   Assessment and Plan:   [name] is a 87 y.o. y/o female here for follow-up of epigastric pain  Epigastric pain has improved compared to before, however she is still having episodes that wake her up at night  Patient has had work-up for this including CT abdomen pelvis with subsequent evaluation by vascular surgery.  Please see their notes.  Symptoms likely related to GERD specially since it occurs when she is laying down and relieved with drinking milk  I have advised her to start taking her Prilosec 30 minutes before dinner to see if it improves her nightly symptoms  If symptoms get worse during the day or do not improve at night, patient vies to call us back in  2 weeks and let us know and we may need to do twice daily PPI therapy short-term  (  Risks of PPI use were discussed with patient including bone loss, C. Diff diarrhea, pneumonia, infections, CKD,  electrolyte abnormalities.  Pt. Verbalizes understanding and chooses to continue the medication.)  Constipation improved with Metamucil once a day, continue.     Dr [name]

## 2021-05-15 ENCOUNTER — Other Ambulatory Visit: Payer: Self-pay

## 2021-05-15 ENCOUNTER — Encounter: Payer: Self-pay | Admitting: Internal Medicine

## 2021-05-15 ENCOUNTER — Ambulatory Visit (INDEPENDENT_AMBULATORY_CARE_PROVIDER_SITE_OTHER): Payer: Medicare Other | Admitting: Internal Medicine

## 2021-05-15 DIAGNOSIS — M810 Age-related osteoporosis without current pathological fracture: Secondary | ICD-10-CM

## 2021-05-15 DIAGNOSIS — I208 Other forms of angina pectoris: Secondary | ICD-10-CM | POA: Diagnosis not present

## 2021-05-15 DIAGNOSIS — M65321 Trigger finger, right index finger: Secondary | ICD-10-CM | POA: Diagnosis not present

## 2021-05-15 DIAGNOSIS — K2100 Gastro-esophageal reflux disease with esophagitis, without bleeding: Secondary | ICD-10-CM

## 2021-05-15 DIAGNOSIS — I83811 Varicose veins of right lower extremities with pain: Secondary | ICD-10-CM | POA: Diagnosis not present

## 2021-05-15 NOTE — Patient Instructions (Signed)
I'm glad you are feeling better  Try using a softer wedge for your sleeping.  It will prevent reflux   Your pain can be treated safely every day with up to 2000 mg of tylenol  Your finger may be developing a trigger finger.  Dr Jefm Bryant may be able ot treat it with a steroid injection

## 2021-05-15 NOTE — Progress Notes (Signed)
Subjective:  Patient ID: [name], female    DOB: Jun 02, 1927  Age: 87 y.o. MRN: [id number]  CC: Diagnoses of Osteoporosis, postmenopausal, Varicose veins of leg with pain, right, Gastroesophageal reflux disease with esophagitis without hemorrhage, and Trigger finger, right index finger were pertinent to this visit.  HPI [name] presents for FOLLOW UP ON CHRONIC issues.  She is accompanied by her daughter [name].    This visit occurred during the SARS-CoV-2 public health emergency.  Safety protocols were in place, including screening questions prior to the visit, additional usage of staff PPE, and extensive cleaning of exam room while observing appropriate contact time as indicated for disinfecting solutions.   HTN:  Patient is taking her medications as prescribed and notes no adverse effects.  Home BP readings have been done  daily and are  generally < 130/80 .  She is avoiding added salt in her diet and active physically at home but not walking regularly .   She is developing a trigger finger on her right hand, index finger.  She has developed edema around the ankles .  She has varicose veins and has not been wearing support stockings during the last several weeks due to the heat.   Her recurrent epigastric pain occurring at night in supine position has improved but not resolved with treatment of GERD . She has not tried using a Wedge pillow   Outpatient Medications Prior to Visit  Medication Sig Dispense Refill   acetaminophen (TYLENOL) 500 MG tablet Take 500 mg by mouth every 6 (six) hours as needed for mild pain.     Alfalfa 500 MG TABS Take 500 mg by mouth daily.      Ascorbic Acid (VITAMIN C CR) 500 MG CPCR Take 500 mg by mouth daily.     aspirin EC 81 MG EC tablet Take 1 tablet (81 mg total) by mouth every evening. 120 tablet 0   atorvastatin (LIPITOR) 10 MG tablet TAKE 1 TABLET BY MOUTH ONCE DAILY IN THE EVENING 90 tablet 0   Calcium Carbonate (CALCIUM 600  PO) Take by mouth.     Cholecalciferol (VITAMIN D3) 3000 UNITS TABS Take 3,000 Units by mouth 2 (two) times daily.      CINNAMON PO Take 1,000 mg by mouth daily.      clotrimazole-betamethasone (LOTRISONE) cream Apply topically 2 (two) times daily. (Patient taking differently: Apply topically 2 (two) times daily as needed.) 30 g 1   diclofenac sodium (VOLTAREN) 1 % GEL Apply topically as needed.      fluticasone (FLONASE) 50 MCG/ACT nasal spray Place 1 spray into both nostrils daily.     Ginkgo Biloba (GNP GINGKO BILOBA EXTRACT PO) Take 1 tablet by mouth daily.     Glucosamine Sulfate 500 MG CAPS Take 500 mg by mouth daily.     hydrALAZINE (APRESOLINE) 10 MG tablet Take 1 tablet (10 mg total) by mouth 3 (three) times daily. As needed for sbp  170 or higher 30 tablet 0   isosorbide mononitrate (IMDUR) 30 MG 24 hr tablet Take 0.5 tablets (15 mg total) by mouth daily. 45 tablet 0   Lactobacillus (PROBIOTIC ACIDOPHILUS PO) Take 1 capsule by mouth daily.      loratadine (CLARITIN) 10 MG tablet Take 10 mg by mouth daily as needed for allergies.     Magnesium 400 MG CAPS Take 400 mg by mouth daily.     metoprolol succinate (TOPROL XL) 25 MG 24 hr tablet Take 0.5 tablets (  12.5 mg total) by mouth daily. 45 tablet 0   Multiple Vitamin (MULTIVITAMIN) capsule Take 1 capsule by mouth daily.     mupirocin ointment (BACTROBAN) 2 % Apply to right leg wound daily - a small dab as needed once a day. 15 g 0   nitroGLYCERIN (NITROSTAT) 0.4 MG SL tablet Place 1 tablet (0.4 mg total) under the tongue every 5 (five) minutes as needed for chest pain. 50 tablet 3   Omega-3 Fatty Acids (OMEGA 3 PO) Take 520 mg by mouth daily.      omeprazole (PRILOSEC) 20 MG capsule Take 1 capsule (20 mg total) by mouth daily. 30 minutes before dinner 30 capsule 0   Pramoxine-HC (HYDROCORTISONE ACE-PRAMOXINE) 2.5-1 % CREA Apply topically as needed.      Turmeric 450 MG CAPS Take 450 Units by mouth daily.      vitamin B-12  (CYANOCOBALAMIN) 1000 MCG tablet Take 1,000 mcg by mouth daily.     No facility-administered medications prior to visit.    Review of Systems;  Patient denies headache, fevers, malaise, unintentional weight loss, skin rash, eye pain, sinus congestion and sinus pain, sore throat, dysphagia,  hemoptysis , cough, dyspnea, wheezing, chest pain, palpitations, orthopnea, edema, abdominal pain, nausea, melena, diarrhea, constipation, flank pain, dysuria, hematuria, urinary  Frequency, nocturia, numbness, tingling, seizures,  Focal weakness, Loss of consciousness,  Tremor, insomnia, depression, anxiety, and suicidal ideation.      Objective:  BP 124/66 (BP Location: Left Arm, Patient Position: Sitting, Cuff Size: Normal)   Pulse 64   Temp (!) 95.6 F (35.3 C) (Temporal)   Resp 15   Ht 5' (1.524 m)   Wt 101 lb 6.4 oz (46 kg)   SpO2 99%   BMI 19.80 kg/m   BP Readings from Last 3 Encounters:  05/15/21 124/66  05/13/21 (!) 172/76  04/01/21 (!) 150/78    Wt Readings from Last 3 Encounters:  05/15/21 101 lb 6.4 oz (46 kg)  05/13/21 102 lb (46.3 kg)  05/06/21 101 lb (45.8 kg)    General appearance: alert, cooperative and appears stated age Ears: normal TM's and external ear canals both ears Throat: lips, mucosa, and tongue normal; teeth and gums normal Neck: no adenopathy, no carotid bruit, supple, symmetrical, trachea midline and thyroid not enlarged, symmetric, no tenderness/mass/nodules Back: symmetric, no curvature. ROM normal. No CVA tenderness. Lungs: clear to auscultation bilaterally Heart: regular rate and rhythm, S1, S2 normal, no murmur, click, rub or gallop Abdomen: soft, non-tender; bowel sounds normal; no masses,  no organomegaly Pulses: 2+ and symmetric Skin: Skin color, texture, turgor normal. No rashes or lesions Lymph nodes: Cervical, supraclavicular, and axillary nodes normal.  Lab Results  Component Value Date   HGBA1C 6.1 (H) 05/14/2019   HGBA1C 6.2 (H)  01/01/2019   HGBA1C 6.1 (H) 03/14/2018    Lab Results  Component Value Date   CREATININE 0.62 04/01/2021   CREATININE 0.70 02/18/2021   CREATININE 0.68 05/28/2020    Lab Results  Component Value Date   WBC 5.9 05/22/2020   HGB 13.1 05/22/2020   HCT 40.0 05/22/2020   PLT 212 05/22/2020   GLUCOSE 154 (H) 04/01/2021   CHOL 137 04/03/2020   TRIG 95.0 04/03/2020   HDL 62.80 04/03/2020   LDLCALC 55 04/03/2020   ALT 18 04/01/2021   AST 32 04/01/2021   NA 138 04/01/2021   K 4.1 04/01/2021   CL 101 04/01/2021   CREATININE 0.62 04/01/2021   BUN 15 04/01/2021   CO2  29 04/01/2021   TSH 2.22 03/12/2020   HGBA1C 6.1 (H) 05/14/2019     Assessment & Plan:   Problem List Items Addressed This Visit       Unprioritized   GERD (gastroesophageal reflux disease)    Improved with chang in adminisration of PPI therapy to dinner time. Advised her to use a wedge pillow at night to minimize epigastric pain        Osteoporosis, postmenopausal    Has been offered prolia by Dr Jefm Bryant in the past but has declined due to fear of side effects        Trigger finger, right index finger    Early,  With appt planned with  Dr Jefm Bryant.        Varicose veins of leg with pain, right    Advised to elevate leg and resume use of compression stockings        I provided  30 minutes of  face-to-face time during this encounter reviewing patient's epigastric pain,  trigger finger and lower extremity edema; recent   labs and imaging studies, providing counseling on the above mentioned problems , and coordination  of care .   I am having Ellie Lunch maintain her Vitamin D3, multivitamin, Omega-3 Fatty Acids (OMEGA 3 PO), Vitamin C CR, Lactobacillus (PROBIOTIC ACIDOPHILUS PO), Glucosamine Sulfate, vitamin B-12, CINNAMON PO, Turmeric, acetaminophen, Magnesium, loratadine, Ginkgo Biloba (GNP GINGKO BILOBA EXTRACT PO), Hydrocortisone Ace-Pramoxine, fluticasone, clotrimazole-betamethasone, diclofenac  sodium, Alfalfa, aspirin, nitroGLYCERIN, Calcium Carbonate (CALCIUM 600 PO), mupirocin ointment, hydrALAZINE, atorvastatin, isosorbide mononitrate, metoprolol succinate, and omeprazole.  No orders of the defined types were placed in this encounter.   There are no discontinued medications.  Follow-up: Return in about 6 months (around 11/15/2021).   Crecencio Mc, MD

## 2021-05-15 NOTE — Assessment & Plan Note (Addendum)
Has been offered prolia by Dr Jefm Bryant in the past but has declined due to fear of side effects

## 2021-05-17 DIAGNOSIS — M65321 Trigger finger, right index finger: Secondary | ICD-10-CM | POA: Insufficient documentation

## 2021-05-17 NOTE — Assessment & Plan Note (Signed)
Early,  With appt planned with  Dr Jefm Bryant.

## 2021-05-17 NOTE — Assessment & Plan Note (Signed)
Advised to elevate leg and resume use of compression stockings

## 2021-05-17 NOTE — Assessment & Plan Note (Signed)
   Lab Results  Component Value Date   CREATININE 0.62 04/01/2021

## 2021-05-17 NOTE — Assessment & Plan Note (Signed)
Improved with chang in adminisration of PPI therapy to dinner time. Advised her to use a wedge pillow at night to minimize epigastric pain

## 2021-05-22 ENCOUNTER — Ambulatory Visit (INDEPENDENT_AMBULATORY_CARE_PROVIDER_SITE_OTHER): Payer: Medicare Other | Admitting: Vascular Surgery

## 2021-05-22 ENCOUNTER — Encounter (INDEPENDENT_AMBULATORY_CARE_PROVIDER_SITE_OTHER): Payer: Medicare Other

## 2021-05-30 ENCOUNTER — Other Ambulatory Visit: Payer: Self-pay

## 2021-05-30 ENCOUNTER — Ambulatory Visit
Admission: EM | Admit: 2021-05-30 | Discharge: 2021-05-30 | Disposition: A | Discharge status: Home / Self Care | Payer: Medicare Other | Attending: Physician Assistant | Admitting: Physician Assistant

## 2021-05-30 ENCOUNTER — Encounter: Payer: Self-pay | Admitting: Emergency Medicine

## 2021-05-30 DIAGNOSIS — Z79899 Other long term (current) drug therapy: Secondary | ICD-10-CM | POA: Diagnosis not present

## 2021-05-30 DIAGNOSIS — I5A Non-ischemic myocardial injury (non-traumatic): Secondary | ICD-10-CM | POA: Diagnosis not present

## 2021-05-30 DIAGNOSIS — U071 COVID-19: Secondary | ICD-10-CM

## 2021-05-30 DIAGNOSIS — R9431 Abnormal electrocardiogram [ECG] [EKG]: Secondary | ICD-10-CM | POA: Diagnosis not present

## 2021-05-30 DIAGNOSIS — I7300 Raynaud's syndrome without gangrene: Secondary | ICD-10-CM | POA: Diagnosis not present

## 2021-05-30 DIAGNOSIS — R079 Chest pain, unspecified: Secondary | ICD-10-CM | POA: Diagnosis not present

## 2021-05-30 DIAGNOSIS — Z833 Family history of diabetes mellitus: Secondary | ICD-10-CM | POA: Diagnosis not present

## 2021-05-30 DIAGNOSIS — I493 Ventricular premature depolarization: Secondary | ICD-10-CM | POA: Diagnosis not present

## 2021-05-30 DIAGNOSIS — I4949 Other premature depolarization: Secondary | ICD-10-CM | POA: Diagnosis not present

## 2021-05-30 DIAGNOSIS — I10 Essential (primary) hypertension: Secondary | ICD-10-CM | POA: Diagnosis not present

## 2021-05-30 DIAGNOSIS — Z823 Family history of stroke: Secondary | ICD-10-CM | POA: Diagnosis not present

## 2021-05-30 DIAGNOSIS — R0789 Other chest pain: Secondary | ICD-10-CM | POA: Diagnosis not present

## 2021-05-30 DIAGNOSIS — R0981 Nasal congestion: Secondary | ICD-10-CM | POA: Diagnosis not present

## 2021-05-30 DIAGNOSIS — R5383 Other fatigue: Secondary | ICD-10-CM | POA: Diagnosis not present

## 2021-05-30 DIAGNOSIS — I771 Stricture of artery: Secondary | ICD-10-CM | POA: Diagnosis not present

## 2021-05-30 DIAGNOSIS — R509 Fever, unspecified: Secondary | ICD-10-CM | POA: Diagnosis not present

## 2021-05-30 DIAGNOSIS — E782 Mixed hyperlipidemia: Secondary | ICD-10-CM | POA: Diagnosis not present

## 2021-05-30 DIAGNOSIS — K219 Gastro-esophageal reflux disease without esophagitis: Secondary | ICD-10-CM | POA: Diagnosis not present

## 2021-05-30 DIAGNOSIS — Z8249 Family history of ischemic heart disease and other diseases of the circulatory system: Secondary | ICD-10-CM | POA: Diagnosis not present

## 2021-05-30 DIAGNOSIS — R778 Other specified abnormalities of plasma proteins: Secondary | ICD-10-CM | POA: Diagnosis not present

## 2021-05-30 NOTE — ED Provider Notes (Signed)
[facility]    CSN: [id number] Arrival date & time: 05/30/21  1444      History   Chief Complaint Chief Complaint  Patient presents with   Fatigue   Cough    HPI [name] is a 87 y.o. female presenting for 3 to 4-day history of fatigue, body aches, sore throat, cough, nasal congestion, chills and low-grade temperatures.  She says she did take an at home COVID test yesterday that was definitely positive.  Patient also states she has been vaccinated for COVID-19 x3.  She has not been taking any OTC meds for symptoms.  She says that she will feel pulsating in her arms at times and describes occasional chest tightness.  She denies any chest pain or breathing difficulty.  No abdominal pain, nausea/vomiting or diarrhea.  Patient does have history of stage II CKD, hyperlipidemia, RA, but for the most part is very healthy.  She has no other complaints.  HPI  Past Medical History:  Diagnosis Date   Allergy    Arthritis    Atherosclerosis    Atherosclerosis of abdominal aorta (HCC)    Chronic kidney disease    stage 2   CKD (chronic kidney disease) stage 2, GFR 60-89 ml/min    Collagen vascular disease ([place])    Diverticulosis 2008   GERD (gastroesophageal reflux disease)    Heart murmur    Hyperlipidemia    Lumbar spinal stenosis    Osteoporosis    Ovarian lump    RA (rheumatoid arthritis) ([place])    Raynaud's disease    Rheumatoid arthritis ([place])    Spinal stenosis     Patient Active Problem List   Diagnosis Date Noted   Trigger finger, right index finger 05/17/2021   Anxiety about health 04/02/2021   History of hepatitis A 09/16/2020   Leg pain, right 08/28/2020   Need for immunization against influenza 08/28/2020   Asymptomatic varicose veins of lower extremity, bilateral 08/28/2020   Hospital discharge follow-up 04/05/2020   Angina of effort ([place]) 03/26/2020   Chest pain 03/25/2020   Accelerated hypertension 03/25/2020   Atypical chest pain  03/13/2020   Elevated AST (SGOT) 10/11/2019   Osteoporosis, postmenopausal 08/07/2019   Chronic pain in right shoulder 12/06/2018   Primary osteoarthritis of both hands 06/05/2018   Pancreatic cyst 04/10/2018   Myalgia due to statin 04/10/2018   Raynaud's phenomenon without gangrene 04/04/2018   Elevated rheumatoid factor 04/04/2018   Positive ANA (antinuclear antibody) 04/04/2018   Varicose veins of leg with pain, right 09/13/2017   History of lumbar laminectomy for spinal cord decompression 07/19/2017   DNAR (do not attempt resuscitation) 04/19/2017   Compression fracture of L4 lumbar vertebra 12/10/2016   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   GERD (gastroesophageal reflux disease) 04/09/2016   Preventative health care 01/03/2016   Hyperglycemia 12/19/2015   Compression fracture of thoracic spine, non-traumatic ([place]) 08/19/2015   Chronic thoracic spine pain 08/15/2015   Compression fracture of L2 lumbar vertebra ([place]) 08/15/2015   Hyperlipidemia    Rheumatoid arthritis ([place])    Atherosclerosis of abdominal aorta (HCC)    Lumbar spinal stenosis    CAD (coronary artery disease) 06/24/2014   History of adenomatous polyp of colon 06/14/2014   Amaurosis fugax of left eye 01/29/2013   Pulmonary nodule seen on imaging study 07/13/2012   Irritable bowel syndrome 02/16/2012    Past Surgical History:  Procedure Laterality Date   BREAST SURGERY Left 1965   Benign biopsy  CHOLECYSTECTOMY  2005   [facility]   COLONOSCOPY  2008   Dr. Donnella Sham   EXCISION OF BREAST BIOPSY Left 1965   benign   EYE SURGERY Bilateral    Cataract Extraction with IOL   KYPHOPLASTY N/A 12/17/2016   Procedure: KYPHOPLASTY;  Surgeon: Hessie Knows, MD;  Location: [facility];  Service: Orthopedics;  Laterality: N/A;  l4   LUMBAR LAMINECTOMY  05/09/2017   L2-L5   VISCERAL ANGIOGRAPHY N/A 08/15/2017   Procedure: VISCERAL ANGIOGRAPHY;  Surgeon: Algernon Huxley, MD;  Location: [facility];  Service:  Cardiovascular;  Laterality: N/A;   VISCERAL ANGIOGRAPHY N/A 10/05/2017   Procedure: VISCERAL ANGIOGRAPHY;  Surgeon: Algernon Huxley, MD;  Location: [facility];  Service: Cardiovascular;  Laterality: N/A;   VISCERAL ARTERY INTERVENTION N/A 08/15/2017   Procedure: VISCERAL ARTERY INTERVENTION;  Surgeon: Algernon Huxley, MD;  Location: [facility];  Service: Cardiovascular;  Laterality: N/A;    OB History     Gravida  3   Para  3   Term      Preterm      AB      Living         SAB      IAB      Ectopic      Multiple      Live Births           Obstetric Comments  1st Menstrual Cycle 13  1st Pregnancy:  21          Home Medications    Prior to Admission medications   Medication Sig Start Date End Date Taking? Authorizing Provider  Ascorbic Acid (VITAMIN C CR) 500 MG CPCR Take 500 mg by mouth daily.   Yes [provider]  aspirin EC 81 MG EC tablet Take 1 tablet (81 mg total) by mouth every evening. 03/27/20  Yes Lavina Hamman, MD  atorvastatin (LIPITOR) 10 MG tablet TAKE 1 TABLET BY MOUTH ONCE DAILY IN THE EVENING 04/21/21  Yes Crecencio Mc, MD  Calcium Carbonate (CALCIUM 600 PO) Take by mouth.   Yes [provider]  Cholecalciferol (VITAMIN D3) 3000 UNITS TABS Take 3,000 Units by mouth 2 (two) times daily.    Yes [provider]  CINNAMON PO Take 1,000 mg by mouth daily.    Yes [provider]  Ginkgo Biloba (GNP GINGKO BILOBA EXTRACT PO) Take 1 tablet by mouth daily.   Yes [provider]  Glucosamine Sulfate 500 MG CAPS Take 500 mg by mouth daily.   Yes [provider]  isosorbide mononitrate (IMDUR) 30 MG 24 hr tablet Take 0.5 tablets (15 mg total) by mouth daily. 05/07/21  Yes Agbor-Etang, Aaron Edelman, MD  Lactobacillus (PROBIOTIC ACIDOPHILUS PO) Take 1 capsule by mouth daily.    Yes [provider]  Magnesium 400 MG CAPS Take 400 mg by mouth daily.   Yes [provider]   metoprolol succinate (TOPROL XL) 25 MG 24 hr tablet Take 0.5 tablets (12.5 mg total) by mouth daily. 05/07/21 05/07/22 Yes Agbor-Etang, Aaron Edelman, MD  Multiple Vitamin (MULTIVITAMIN) capsule Take 1 capsule by mouth daily.   Yes [provider]  Omega-3 Fatty Acids (OMEGA 3 PO) Take 520 mg by mouth daily.    Yes [provider]  omeprazole (PRILOSEC) 20 MG capsule Take 1 capsule (20 mg total) by mouth daily. 30 minutes before dinner 05/13/21  Yes Tahiliani, Lennette Bihari, MD  Turmeric 450 MG CAPS Take  450 Units by mouth daily.    Yes [provider]  vitamin B-12 (CYANOCOBALAMIN) 1000 MCG tablet Take 1,000 mcg by mouth daily.   Yes [provider]  acetaminophen (TYLENOL) 500 MG tablet Take 500 mg by mouth every 6 (six) hours as needed for mild pain.    [provider]  Alfalfa 500 MG TABS Take 500 mg by mouth daily.     [provider]  clotrimazole-betamethasone (LOTRISONE) cream Apply topically 2 (two) times daily. Patient taking differently: Apply topically 2 (two) times daily as needed. 03/14/19   Poulose, Bethel Born, NP  diclofenac sodium (VOLTAREN) 1 % GEL Apply topically as needed.  08/02/19   [provider]  fluticasone (FLONASE) 50 MCG/ACT nasal spray Place 1 spray into both nostrils daily.    [provider]  hydrALAZINE (APRESOLINE) 10 MG tablet Take 1 tablet (10 mg total) by mouth 3 (three) times daily. As needed for sbp  170 or higher 09/16/20   Crecencio Mc, MD  loratadine (CLARITIN) 10 MG tablet Take 10 mg by mouth daily as needed for allergies.    [provider]  mupirocin ointment (BACTROBAN) 2 % Apply to right leg wound daily - a small dab as needed once a day. 08/26/20   Marval Regal, NP  nitroGLYCERIN (NITROSTAT) 0.4 MG SL tablet Place 1 tablet (0.4 mg total) under the tongue every 5 (five) minutes as needed for chest pain. 04/22/20   Crecencio Mc, MD  Pramoxine-HC (HYDROCORTISONE ACE-PRAMOXINE) 2.5-1  % CREA Apply topically as needed.     [provider]    Family History Family History  Problem Relation Age of Onset   Heart disease Mother    Hypertension Father    Heart disease Brother 62       heart attack   Cancer Brother        bladder   Diabetes Sister    Alcohol abuse Sister    Kidney disease Sister    Diabetes Brother    Stroke Brother    Diabetes Maternal Grandmother    Heart disease Maternal Grandmother    Cancer Brother        lung    Social History Social History   Tobacco Use   Smoking status: Never   Smokeless tobacco: Never   Tobacco comments:    smoking cessation materials not required  Vaping Use   Vaping Use: Never used  Substance Use Topics   Alcohol use: No    Alcohol/week: 0.0 standard drinks   Drug use: No     Allergies   Ibandronic acid, Other, Prednisone, Actonel [risedronate], Amoxicillin, Pantoprazole, Raloxifene, Versed [midazolam], and Doxycycline   Review of Systems Review of Systems  Constitutional:  Positive for chills, fatigue and fever. Negative for diaphoresis.  HENT:  Positive for congestion, rhinorrhea and sore throat. Negative for ear pain, sinus pressure and sinus pain.   Respiratory:  Positive for cough and chest tightness. Negative for shortness of breath.   Cardiovascular:  Negative for chest pain.  Gastrointestinal:  Negative for abdominal pain, nausea and vomiting.  Musculoskeletal:  Negative for arthralgias and myalgias.  Skin:  Negative for rash.  Neurological:  Negative for dizziness, weakness and headaches.  Hematological:  Negative for adenopathy.    Physical Exam Triage Vital Signs ED Triage Vitals  Enc Vitals Group     BP 05/30/21 1501 (!) 154/73     Pulse Rate 05/30/21 1501 71     Resp 05/30/21  1501 15     Temp 05/30/21 1501 99.6 F (37.6 C)     Temp Source 05/30/21 1501 Oral     SpO2 05/30/21 1501 99 %     Weight 05/30/21 1456 101 lb (45.8 kg)     Height 05/30/21 1456 5' (1.524 m)      Head Circumference --      Peak Flow --      Pain Score 05/30/21 1456 0     Pain Loc --      Pain Edu? --      Excl. in [place]? --    No data found.  Updated Vital Signs BP (!) 154/73 (BP Location: Left Arm)   Pulse 71   Temp 99.6 F (37.6 C) (Oral)   Resp 15   Ht 5' (1.524 m)   Wt 101 lb (45.8 kg)   SpO2 99%   BMI 19.73 kg/m      Physical Exam Vitals and nursing note reviewed.  Constitutional:      General: She is not in acute distress.    Appearance: Normal appearance. She is not ill-appearing or toxic-appearing.  HENT:     Head: Normocephalic and atraumatic.     Nose: Congestion and rhinorrhea present.     Mouth/Throat:     Mouth: Mucous membranes are moist.     Pharynx: Oropharynx is clear. Posterior oropharyngeal erythema present.  Eyes:     General: No scleral icterus.       Right eye: No discharge.        Left eye: No discharge.     Conjunctiva/sclera: Conjunctivae normal.  Cardiovascular:     Rate and Rhythm: Normal rate. Rhythm irregular.     Heart sounds: Normal heart sounds.  Pulmonary:     Effort: Pulmonary effort is normal. No respiratory distress.     Breath sounds: Normal breath sounds. No wheezing, rhonchi or rales.  Musculoskeletal:     Cervical back: Neck supple.  Skin:    General: Skin is dry.  Neurological:     General: No focal deficit present.     Mental Status: She is alert. Mental status is at baseline.     Motor: No weakness.     Gait: Gait normal.  Psychiatric:        Mood and Affect: Mood normal.        Behavior: Behavior normal.        Thought Content: Thought content normal.     UC Treatments / Results  Labs (all labs ordered are listed, but only abnormal results are displayed) Labs Reviewed - No data to display  EKG   Radiology No results found.  Procedures ED EKG  Date/Time: 05/30/2021 4:47 PM Performed by: Danton Clap, PA-C Authorized by: Danton Clap, PA-C   ECG reviewed by ED Physician in the absence of  a cardiologist: yes   Previous ECG:    Previous ECG:  Compared to current Interpretation:    Interpretation: abnormal   Rate:    ECG rate:  85   ECG rate assessment: normal   Rhythm:    Rhythm: sinus rhythm   Ectopy:    Ectopy: PAC   QRS:    QRS axis:  Normal   QRS intervals:  Normal   QRS conduction: normal   ST segments:    ST segments:  Normal T waves:    T waves: normal   Other findings:    Other findings: LVH   Comments:  NSR, PACs with abberant conduction, LVH (including critical care time)  Medications Ordered in UC Medications - No data to display  Initial Impression / Assessment and Plan / UC Course  I have reviewed the triage vital signs and the nursing notes.  Pertinent labs & imaging results that were available during my care of the patient were reviewed by me and considered in my medical decision making (see chart for details).  87 year old female presenting with her daughter for 3 to 4-day history of fatigue, body aches, sore throat, cough, congestion, chills and low-grade fevers.  Also admits to chest tightness.  States that she had chest tightness starting 4 days ago but has gotten worse over the past 2 days.  She says it feels like it is mucous stuck in her throat/chest.  No radiation of the pain.  Denies any breathing difficulty.  No chest pain on breathing.  Vital signs are stable but blood pressure is little elevated at 154/73.  Temp elevated at 99.6.  The remainder of the vital signs are normal.  Patient is overall well-appearing and in no distress.  Exam is significant for minor congestion and clear rhinorrhea.  Chest is clear to auscultation but she does have an irregular rhythm.  EKG performed today shows sinus rhythm with PACs and aberrant conduction.  EKG compared to EKG from last year which showed normal sinus rhythm and no abnormal findings.  Reviewed this result with patient.  Since she does have COVID-19 and complaints of chest tightness with an  abnormal EKG/EKG changes from last year, advised that she needs work-up in the emergency department.  Patient declines to go by EMS and does not want to go to [facility].  Her daughter will take her to [facility] [place] at this time.  Patient leaving in stable condition.   Final Clinical Impressions(s) / UC Diagnoses   Final diagnoses:  [id number]  Chest pain, unspecified type  Nonspecific abnormal electrocardiogram (ECG) (EKG)     Discharge Instructions      Your EKG is abnormal compared to a year ago.  Given the fact that you are 87 years old and recently has a past for COVID-19 and have an abnormal EKG with complaint of chest tightness over the past couple of days that has been worsening he needs to be seen in the emergency department.  You have agreed to go to [facility] at this time.  You have been advised to follow up immediately in the emergency department for concerning signs.symptoms. If you declined EMS transport, please have a family member take you directly to the ED at this time. Do not delay. Based on concerns about condition, if you do not follow up in th e ED, you may risk poor outcomes including worsening of condition, delayed treatment and potentially life threatening issues. If you have declined to go to the ED at this time, you should call your PCP immediately to set up a follow up appointment.  Go to ED for red flag symptoms, including; fevers you cannot reduce with Tylenol/Motrin, severe headaches, vision changes, numbness/weakness in part of the body, lethargy, confusion, intractable vomiting, severe dehydration, chest pain, breathing difficulty, severe persistent abdominal or pelvic pain, signs of severe infection (increased redness, swelling of an area), feeling faint or passing out, dizziness, etc. You should especially go to the ED for sudden acute worsening of condition if you do not elect to go at this time.      ED Prescriptions   None  PDMP not reviewed this  encounter.   Danton Clap, PA-C 05/30/21 1651

## 2021-05-30 NOTE — ED Triage Notes (Signed)
Patient c/o cough, nasal congestion, sore throat, chills, and low grade fever and fatigue that started on Wed.  Patient took home covid test and was positive.

## 2021-05-30 NOTE — ED Notes (Signed)
Patient is being discharged from the Urgent Care and sent to the [facility] Emergency Department via private vehicle . Per Laurene Footman, PA, patient is in need of higher level of care due to COVID+ and chest tightness. Patient is aware and verbalizes understanding of plan of care.  Vitals:   05/30/21 1501  BP: (!) 154/73  Pulse: 71  Resp: 15  Temp: 99.6 F (37.6 C)  SpO2: 99%

## 2021-05-30 NOTE — Discharge Instructions (Addendum)
Your EKG is abnormal compared to a year ago.  Given the fact that you are 87 years old and recently has a past for COVID-19 and have an abnormal EKG with complaint of chest tightness over the past couple of days that has been worsening he needs to be seen in the emergency department.  You have agreed to go to [facility] at this time.  You have been advised to follow up immediately in the emergency department for concerning signs.symptoms. If you declined EMS transport, please have a family member take you directly to the ED at this time. Do not delay. Based on concerns about condition, if you do not follow up in th e ED, you may risk poor outcomes including worsening of condition, delayed treatment and potentially life threatening issues. If you have declined to go to the ED at this time, you should call your PCP immediately to set up a follow up appointment.  Go to ED for red flag symptoms, including; fevers you cannot reduce with Tylenol/Motrin, severe headaches, vision changes, numbness/weakness in part of the body, lethargy, confusion, intractable vomiting, severe dehydration, chest pain, breathing difficulty, severe persistent abdominal or pelvic pain, signs of severe infection (increased redness, swelling of an area), feeling faint or passing out, dizziness, etc. You should especially go to the ED for sudden acute worsening of condition if you do not elect to go at this time.

## 2021-05-31 DIAGNOSIS — U071 COVID-19: Secondary | ICD-10-CM | POA: Diagnosis not present

## 2021-05-31 DIAGNOSIS — I5A Non-ischemic myocardial injury (non-traumatic): Secondary | ICD-10-CM | POA: Diagnosis not present

## 2021-05-31 DIAGNOSIS — E785 Hyperlipidemia, unspecified: Secondary | ICD-10-CM | POA: Diagnosis not present

## 2021-05-31 DIAGNOSIS — I10 Essential (primary) hypertension: Secondary | ICD-10-CM | POA: Diagnosis not present

## 2021-06-01 ENCOUNTER — Telehealth: Payer: Self-pay

## 2021-06-01 NOTE — Telephone Encounter (Signed)
Noted Seen in ed 05/30/21

## 2021-06-01 NOTE — Telephone Encounter (Signed)
Pt was seen in the ED on 05/30/21 and told to start taking Paxlovid.

## 2021-06-02 NOTE — Telephone Encounter (Signed)
Pt was seen yesterday at ed and was advised to follow up with PCP. No appts avail-please advise

## 2021-06-02 NOTE — Telephone Encounter (Signed)
Called and spoke with [name]. [name] scheduled for 07/14/21. [name] verbalized understanding and had no other questions. Pt was understanding that Dr. Lupita Dawn august schedule was full.

## 2021-06-03 NOTE — Telephone Encounter (Signed)
Spoke to [name]. [name] is agreeable to moving her appointment to 06/04/21 at 1:30pm. 07/14/21 appointment has been rescheduled.[name]

## 2021-06-03 NOTE — Telephone Encounter (Signed)
LMTCB

## 2021-06-04 ENCOUNTER — Telehealth (INDEPENDENT_AMBULATORY_CARE_PROVIDER_SITE_OTHER): Payer: Medicare Other | Admitting: Internal Medicine

## 2021-06-04 ENCOUNTER — Encounter: Payer: Self-pay | Admitting: Internal Medicine

## 2021-06-04 VITALS — BP 139/81 | HR 64 | Temp 95.5°F | Ht 60.0 in | Wt 101.0 lb

## 2021-06-04 DIAGNOSIS — Z79899 Other long term (current) drug therapy: Secondary | ICD-10-CM | POA: Diagnosis not present

## 2021-06-04 DIAGNOSIS — I252 Old myocardial infarction: Secondary | ICD-10-CM | POA: Diagnosis not present

## 2021-06-04 DIAGNOSIS — Z09 Encounter for follow-up examination after completed treatment for conditions other than malignant neoplasm: Secondary | ICD-10-CM

## 2021-06-04 DIAGNOSIS — U071 COVID-19: Secondary | ICD-10-CM | POA: Diagnosis not present

## 2021-06-04 HISTORY — DX: COVID-19: U07.1

## 2021-06-04 NOTE — Assessment & Plan Note (Signed)
Admitted overnight to [facility] on July 23 after she presented to Urgent Care in [facility] wit a positive COVID home test and was noted to have frequent PVC's and tachycardia on EKG.  She was treated with Remdesivir and discharged on Paxlovid.Her symptoms are now limited to PND and dry cough both improving

## 2021-06-04 NOTE — Assessment & Plan Note (Signed)
Patient is stable post discharge and has no new issues or questions about discharge plans at the visit today for hospital follow up.  I have reviewed the records from the hospital admission in detail with patient today and corrected her plans to resume Lipitor from post paxlovid 3 days to 8 days per [facility] discharge summary . Patient advised to suspend until august 1.  cmet needed next week

## 2021-06-04 NOTE — Progress Notes (Signed)
Virtual Visit via CAREGILITY Note  This visit type was conducted due to national recommendations for restrictions regarding the COVID-19 pandemic (e.g. social distancing).  This format is felt to be most appropriate for this patient at this time.  All issues noted in this document were discussed and addressed.  No physical exam was performed (except for noted visual exam findings with Video Visits).   I connected withNAME@ on 06/04/21 at  1:30 PM EDT by a video enabled telemedicine application and verified that I am speaking with the correct person using two identifiers. Location patient: home Location provider: work or home office Persons participating in the virtual visit: patient, provider  I discussed the limitations, risks, security and privacy concerns of performing an evaluation and management service by telephone and the availability of in person appointments. I also discussed with the patient that there may be a patient responsible charge related to this service. The patient expressed understanding and agreed to proceed.  Reason for visit:  HOSPITAL FOLLOW UP  HPI:  87 YR OLD FEMALE  with history of atypical chest pain,  noncritical CAD ,  admitted to [facility] on July 23 after presenting to [facility] in [facility] with symptomatic COVID 19 infection.  Found o be tachycardic,  frequent PBCs,  referred to [facility]>  Admitted with positive hsTropnin I, (42)  no acute EKG changes) attributed to demand ischemia.  Given remdesivir and observed overnight on telemetry.  Repeat troponin was 36 and she was released the following day on Paxlovid with instructions to suspend Lipitor for 8 days,  and have repeat CMET in "a few weeks."  Patient has been steadily improving since discharge.   and tolerating the medication.  She denies recurrent chest pain/tightness and her cough is dry and minimally intrusive,  appeitte is good,  she did not lose sense of taste. Medications reviewed and corrected her plan to  resume lipitor after 3 days to the recommended 8 day suspension per dc summary   She is ambulating without hypoxia or use of walker.    ROS: See pertinent positives and negatives per HPI.  Past Medical History:  Diagnosis Date   Allergy    Arthritis    Atherosclerosis    Atherosclerosis of abdominal aorta (HCC)    Chronic kidney disease    stage 2   CKD (chronic kidney disease) stage 2, GFR 60-89 ml/min    Collagen vascular disease ([place])    Diverticulosis 2008   GERD (gastroesophageal reflux disease)    Heart murmur    Hyperlipidemia    Lumbar spinal stenosis    Osteoporosis    Ovarian lump    RA (rheumatoid arthritis) ([place])    Raynaud's disease    Rheumatoid arthritis ([place])    Spinal stenosis     Past Surgical History:  Procedure Laterality Date   BREAST SURGERY Left 1965   Benign biopsy   CHOLECYSTECTOMY  2005   [place]   COLONOSCOPY  2008   Dr. Donnella Sham   EXCISION OF BREAST BIOPSY Left 1965   benign   EYE SURGERY Bilateral    Cataract Extraction with IOL   KYPHOPLASTY N/A 12/17/2016   Procedure: KYPHOPLASTY;  Surgeon: Hessie Knows, MD;  Location: [facility] ORS;  Service: Orthopedics;  Laterality: N/A;  l4   LUMBAR LAMINECTOMY  05/09/2017   L2-L5   VISCERAL ANGIOGRAPHY N/A 08/15/2017   Procedure: VISCERAL ANGIOGRAPHY;  Surgeon: Algernon Huxley, MD;  Location: [place] [facility];  Service: Cardiovascular;  Laterality: N/A;  VISCERAL ANGIOGRAPHY N/A 10/05/2017   Procedure: VISCERAL ANGIOGRAPHY;  Surgeon: Algernon Huxley, MD;  Location: [place] [facility];  Service: Cardiovascular;  Laterality: N/A;   VISCERAL ARTERY INTERVENTION N/A 08/15/2017   Procedure: VISCERAL ARTERY INTERVENTION;  Surgeon: Algernon Huxley, MD;  Location: [place] [facility];  Service: Cardiovascular;  Laterality: N/A;    Family History  Problem Relation Age of Onset   Heart disease Mother    Hypertension Father    Heart disease Brother 90       heart attack   Cancer Brother        bladder    Diabetes Sister    Alcohol abuse Sister    Kidney disease Sister    Diabetes Brother    Stroke Brother    Diabetes Maternal Grandmother    Heart disease Maternal Grandmother    Cancer Brother        lung    SOCIAL HX: Lives with daughters Daine Floras and Eustaquio Maize  reports that she has never smoked. She has never used smokeless tobacco. She reports that she does not drink alcohol and does not use drugs.    Current Outpatient Medications:    acetaminophen (TYLENOL) 500 MG tablet, Take 500 mg by mouth every 6 (six) hours as needed for mild pain., Disp: , Rfl:    Alfalfa 500 MG TABS, Take 500 mg by mouth daily., Disp: , Rfl:    Ascorbic Acid (VITAMIN C CR) 500 MG CPCR, Take 500 mg by mouth daily., Disp: , Rfl:    aspirin EC 81 MG EC tablet, Take 1 tablet (81 mg total) by mouth every evening., Disp: 120 tablet, Rfl: 0   atorvastatin (LIPITOR) 10 MG tablet, TAKE 1 TABLET BY MOUTH ONCE DAILY IN THE EVENING, Disp: 90 tablet, Rfl: 0   Calcium Carbonate (CALCIUM 600 PO), Take by mouth., Disp: , Rfl:    Cholecalciferol (VITAMIN D3) 3000 UNITS TABS, Take 3,000 Units by mouth 2 (two) times daily. , Disp: , Rfl:    CINNAMON PO, Take 1,000 mg by mouth daily. , Disp: , Rfl:    clotrimazole-betamethasone (LOTRISONE) cream, Apply topically 2 (two) times daily. (Patient taking differently: Apply topically 2 (two) times daily as needed.), Disp: 30 g, Rfl: 1   diclofenac sodium (VOLTAREN) 1 % GEL, Apply topically as needed. , Disp: , Rfl:    fluticasone (FLONASE) 50 MCG/ACT nasal spray, Place 1 spray into both nostrils daily., Disp: , Rfl:    Ginkgo Biloba (GNP GINGKO BILOBA EXTRACT PO), Take 1 tablet by mouth daily., Disp: , Rfl:    Glucosamine Sulfate 500 MG CAPS, Take 500 mg by mouth daily., Disp: , Rfl:    hydrALAZINE (APRESOLINE) 10 MG tablet, Take 1 tablet (10 mg total) by mouth 3 (three) times daily. As needed for sbp  170 or higher, Disp: 30 tablet, Rfl: 0   isosorbide mononitrate (IMDUR) 30 MG 24 hr tablet,  Take 0.5 tablets (15 mg total) by mouth daily., Disp: 45 tablet, Rfl: 0   Lactobacillus (PROBIOTIC ACIDOPHILUS PO), Take 1 capsule by mouth daily. , Disp: , Rfl:    loratadine (CLARITIN) 10 MG tablet, Take 10 mg by mouth daily as needed for allergies., Disp: , Rfl:    Magnesium 400 MG CAPS, Take 400 mg by mouth daily., Disp: , Rfl:    metoprolol succinate (TOPROL XL) 25 MG 24 hr tablet, Take 0.5 tablets (12.5 mg total) by mouth daily., Disp: 45 tablet, Rfl: 0   Multiple Vitamin (MULTIVITAMIN)  capsule, Take 1 capsule by mouth daily., Disp: , Rfl:    mupirocin ointment (BACTROBAN) 2 %, Apply to right leg wound daily - a small dab as needed once a day., Disp: 15 g, Rfl: 0   Nirmatrelvir & Ritonavir (PAXLOVID) 20 x 150 MG & 10 x 100MG  TBPK, See package instructions., Disp: , Rfl:    nitroGLYCERIN (NITROSTAT) 0.4 MG SL tablet, Place 1 tablet (0.4 mg total) under the tongue every 5 (five) minutes as needed for chest pain., Disp: 50 tablet, Rfl: 3   Omega-3 Fatty Acids (OMEGA 3 PO), Take 520 mg by mouth daily. , Disp: , Rfl:    omeprazole (PRILOSEC) 20 MG capsule, Take 1 capsule (20 mg total) by mouth daily. 30 minutes before dinner, Disp: 30 capsule, Rfl: 0   Pramoxine-HC (HYDROCORTISONE ACE-PRAMOXINE) 2.5-1 % CREA, Apply topically as needed. , Disp: , Rfl:    Turmeric 450 MG CAPS, Take 450 Units by mouth daily. , Disp: , Rfl:    vitamin B-12 (CYANOCOBALAMIN) 1000 MCG tablet, Take 1,000 mcg by mouth daily., Disp: , Rfl:   EXAM:  VITALS per patient if applicable:  GENERAL: alert, oriented, appears well and in no acute distress  HEENT: atraumatic, conjunttiva clear, no obvious abnormalities on inspection of external nose and ears  NECK: normal movements of the head and neck  LUNGS: on inspection no signs of respiratory distress, breathing rate appears normal, no obvious gross SOB, gasping or wheezing  CV: no obvious cyanosis  MS: moves all visible extremities without noticeable  abnormality  PSYCH/NEURO: pleasant and cooperative, no obvious depression or anxiety, speech and thought processing grossly intact  ASSESSMENT AND PLAN:  Discussed the following assessment and plan:  Long-term use of high-risk medication - Plan: Comprehensive metabolic panel  History of myocardial infarction due to demand ischemia  Acute COVID-19  Hospital discharge follow-up  History of myocardial infarction due to demand ischemia Hs troponin I elevation fot 42 during acute COVID illness per [facility] discharge summary.  She denies chest pain at follow up.    Acute COVID-19 Admitted overnight to [facility] on July 23 after she presented to Urgent Care in [facility] wit a positive COVID home test and was noted to have frequent PVC's and tachycardia on EKG.  She was treated with Remdesivir and discharged on Paxlovid.Her symptoms are now limited to PND and dry cough both improving   Hospital discharge follow-up Patient is stable post discharge and has no new issues or questions about discharge plans at the visit today for hospital follow up.  I have reviewed the records from the hospital admission in detail with patient today and corrected her plans to resume Lipitor from post paxlovid 3 days to 8 days per [facility] discharge summary . Patient advised to suspend until august 1.  cmet needed next week     I discussed the assessment and treatment plan with the patient. The patient was provided an opportunity to ask questions and all were answered. The patient agreed with the plan and demonstrated an understanding of the instructions.   The patient was advised to call back or seek an in-person evaluation if the symptoms worsen or if the condition fails to improve as anticipated.   I spent 30 minutes dedicated to the care of this patient on the date of this encounter to include pre-visit review of her hospital records   Face-to-face time with the patient , medication reconciliation and post visit  ordering of testing and therapeutics.  Crecencio Mc, MD

## 2021-06-04 NOTE — Patient Instructions (Signed)
DO NOT RESUME LIPITOR OR SUPPLEMENTS UNTIL AUGUST 1  OFFICE WILL CALL YOU FOR A LAB APPT NEXT Thursday

## 2021-06-04 NOTE — Assessment & Plan Note (Signed)
Hs troponin I elevation fot 42 during acute COVID illness per [facility] discharge summary.  She denies chest pain at follow up.

## 2021-06-06 DIAGNOSIS — Z20822 Contact with and (suspected) exposure to covid-19: Secondary | ICD-10-CM | POA: Diagnosis not present

## 2021-06-10 NOTE — Progress Notes (Signed)
Cardiology Office Note:    Date:  06/12/2021   ID:  [name], DOB 07-Feb-1927, MRN [id number]  PCP:  Brandy Mc, MD  [facility] Cardiologist:  None  [facility] Electrophysiologist:  None   Referring MD: Brandy Mc, MD   Chief Complaint: ER follow-up  History of Present Illness:    [name] is a 87 y.o. female with a hx of HTN, CKD stage 2, GERD, HLD who presents for ER follow-up.   Seen 04/18/20 for chest pain and shortness of breath. Had a recent ER visit with negative trop. She was seen by Dr. Nehemiah Massed but wanted a second opinion. Echo 03/25/20 showed normal systolic and diastolic function. [place] ordered a stress test however she chose to follow with [facility]. Myoview stress test was ordered.  Myoview Lexi scan showed no significant ischemia or scar, EF greater than 65%, aortic atherosclerosis and faint coronary artery calcifications noted on CT attenuation, low risk study.  Diagnosed with COVID 19 in the ER 05/30/21 HS trop 42>36. Patient had upper chest pain at the time. Also there was a concern for extra heart beats.   Today, the patient reports she is doing well. Still feels weak from COVID-19. Patient walks daily, she keeps busy throughout the day. She lives with her daughter on large piece of property. She has no chest pain or pressure with exertion or at rest. She has some shortness of breath after climbing steps, but this is rare. she has varicose veins and has chronic LLE, wears compression socks. She has acid reflux pain at night when she lays down. No orthopnea or pnd. No lightheadedness or dizziness. Once and a while she can feel her heart racing, seems to be rare. Breathing deep seems to help.   Never had a heart attack or MI. 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 She reported acid-reflux symptoms. Brother had a heart attack at 34. No diabetes.   Past Medical History:  Diagnosis Date   Allergy    Arthritis    Atherosclerosis     Atherosclerosis of abdominal aorta (HCC)    Chronic kidney disease    stage 2   CKD (chronic kidney disease) stage 2, GFR 60-89 ml/min    Collagen vascular disease ([place])    Diverticulosis 2008   GERD (gastroesophageal reflux disease)    Heart murmur    Hyperlipidemia    Lumbar spinal stenosis    Osteoporosis    Ovarian lump    RA (rheumatoid arthritis) ([place])    Raynaud's disease    Rheumatoid arthritis ([place])    Spinal stenosis     Past Surgical History:  Procedure Laterality Date   BREAST SURGERY Left 1965   Benign biopsy   CHOLECYSTECTOMY  2005   [place]   COLONOSCOPY  2008   Dr. Donnella Sham   EXCISION OF BREAST BIOPSY Left 1965   benign   EYE SURGERY Bilateral    Cataract Extraction with IOL   KYPHOPLASTY N/A 12/17/2016   Procedure: KYPHOPLASTY;  Surgeon: Hessie Knows, MD;  Location: [facility] ORS;  Service: Orthopedics;  Laterality: N/A;  l4   LUMBAR LAMINECTOMY  05/09/2017   L2-L5   VISCERAL ANGIOGRAPHY N/A 08/15/2017   Procedure: VISCERAL ANGIOGRAPHY;  Surgeon: Algernon Huxley, MD;  Location: [place] [facility];  Service: Cardiovascular;  Laterality: N/A;   VISCERAL ANGIOGRAPHY N/A 10/05/2017   Procedure: VISCERAL ANGIOGRAPHY;  Surgeon: Algernon Huxley, MD;  Location: [place] [facility];  Service: Cardiovascular;  Laterality: N/A;   VISCERAL ARTERY INTERVENTION N/A 08/15/2017   Procedure: VISCERAL ARTERY INTERVENTION;  Surgeon: Algernon Huxley, MD;  Location: [place] [facility];  Service: Cardiovascular;  Laterality: N/A;    Current Medications: Current Meds  Medication Sig   acetaminophen (TYLENOL) 500 MG tablet Take 500 mg by mouth every 6 (six) hours as needed for mild pain.   Alfalfa 500 MG TABS Take 500 mg by mouth daily.   Ascorbic Acid (VITAMIN C CR) 500 MG CPCR Take 500 mg by mouth daily.   aspirin EC 81 MG EC tablet Take 1 tablet (81 mg total) by mouth every evening.   atorvastatin (LIPITOR) 10 MG tablet TAKE 1 TABLET BY MOUTH ONCE DAILY IN THE EVENING   Calcium  Carbonate (CALCIUM 600 PO) Take by mouth.   Cholecalciferol (VITAMIN D3) 3000 UNITS TABS Take 3,000 Units by mouth 2 (two) times daily.    CINNAMON PO Take 1,000 mg by mouth daily.    diclofenac sodium (VOLTAREN) 1 % GEL Apply topically as needed.    fluticasone (FLONASE) 50 MCG/ACT nasal spray Place 1 spray into both nostrils daily.   Ginkgo Biloba (GNP GINGKO BILOBA EXTRACT PO) Take 1 tablet by mouth daily.   Glucosamine Sulfate 500 MG CAPS Take 500 mg by mouth daily.   hydrALAZINE (APRESOLINE) 10 MG tablet Take 1 tablet (10 mg total) by mouth 3 (three) times daily. As needed for sbp  170 or higher   isosorbide mononitrate (IMDUR) 30 MG 24 hr tablet Take 0.5 tablets (15 mg total) by mouth daily.   Lactobacillus (PROBIOTIC ACIDOPHILUS PO) Take 1 capsule by mouth daily.    loratadine (CLARITIN) 10 MG tablet Take 10 mg by mouth daily as needed for allergies.   Magnesium 400 MG CAPS Take 400 mg by mouth daily.   metoprolol succinate (TOPROL XL) 25 MG 24 hr tablet Take 0.5 tablets (12.5 mg total) by mouth daily.   Multiple Vitamin (MULTIVITAMIN) capsule Take 1 capsule by mouth daily.   mupirocin ointment (BACTROBAN) 2 % Apply to right leg wound daily - a small dab as needed once a day.   Nirmatrelvir & Ritonavir (PAXLOVID) 20 x 150 MG & 10 x 100MG  TBPK See package instructions.   nitroGLYCERIN (NITROSTAT) 0.4 MG SL tablet Place 1 tablet (0.4 mg total) under the tongue every 5 (five) minutes as needed for chest pain.   Omega-3 Fatty Acids (OMEGA 3 PO) Take 520 mg by mouth daily.    omeprazole (PRILOSEC) 20 MG capsule Take 1 capsule (20 mg total) by mouth daily. 30 minutes before dinner   Pramoxine-HC (HYDROCORTISONE ACE-PRAMOXINE) 2.5-1 % CREA Apply topically as needed.    Turmeric 450 MG CAPS Take 450 Units by mouth daily.    vitamin B-12 (CYANOCOBALAMIN) 1000 MCG tablet Take 1,000 mcg by mouth daily.     Allergies:   Ibandronic acid, Other, Prednisone, Actonel [risedronate], Amoxicillin,  Pantoprazole, Raloxifene, Versed [midazolam], and Doxycycline   Social History   Socioeconomic History   Marital status: Widowed    Spouse name: [name]   Number of children: 3   Years of education: some college   Highest education level: 12th grade  Occupational History    Employer: RETIRED  Tobacco Use   Smoking status: Never   Smokeless tobacco: Never   Tobacco comments:    smoking cessation materials not required  Vaping Use   Vaping Use: Never used  Substance and Sexual Activity   Alcohol use: No  Alcohol/week: 0.0 standard drinks   Drug use: No   Sexual activity: Not Currently  Other Topics Concern   Not on file  Social History Narrative   Not on file   Social Determinants of Health   Financial Resource Strain: Low Risk    Difficulty of Paying Living Expenses: Not hard at all  Food Insecurity: No Food Insecurity   Worried About Charity fundraiser in the Last Year: Never true   [place] in the Last Year: Never true  Transportation Needs: No Transportation Needs   Lack of Transportation (Medical): No   Lack of Transportation (Non-Medical): No  Physical Activity: Sufficiently Active   Days of Exercise per Week: 7 days   Minutes of Exercise per Session: 30 min  Stress: No Stress Concern Present   Feeling of Stress : Not at all  Social Connections: Moderately Integrated   Frequency of Communication with Friends and Family: More than three times a week   Frequency of Social Gatherings with Friends and Family: Three times a week   Attends Religious Services: More than 4 times per year   Active Member of Clubs or Organizations: Yes   Attends Archivist Meetings: More than 4 times per year   Marital Status: Widowed     Family History: The patient's family history includes Alcohol abuse in her sister; Cancer in her brother and brother; Diabetes in her brother, maternal grandmother, and sister; Heart disease in her maternal grandmother and mother;  Heart disease (age of onset: 60) in her brother; Hypertension in her father; Kidney disease in her sister; Stroke in her brother.  ROS:   Please see the history of present illness.     All other systems reviewed and are negative.  EKGs/Labs/Other Studies Reviewed:    The following studies were reviewed today:  Echo 03/2020  1. Left ventricular ejection fraction, by estimation, is 60 to 65%. The  left ventricle has normal function. The left ventricle has no regional  wall motion abnormalities. Left ventricular diastolic parameters were  normal.   2. Right ventricular systolic function is normal. The right ventricular  size is normal.   3. The mitral valve is normal in structure. Mild mitral valve  regurgitation.   4. The aortic valve is normal in structure. Aortic valve regurgitation is  trivial.    Myoview Lexiscan 04/2020 Normal pharmacologic myocardial perfusion stress test without significant ischemia or scar. The left ventricular ejection fraction is normal (>65%). Aortic atherosclerosis and faint coronary artery calcifications are noted on the attenuation correction CT. This is a low risk study.   EKG:  EKG is  ordered today.  The ekg ordered today demonstrates NSR, PVCs, 75bpm, poor baseline, nonspecific T wave changes  Recent Labs: 04/01/2021: ALT 18; BUN 15; Creatinine, Ser 0.62; Potassium 4.1; Sodium 138  Recent Lipid Panel    Component Value Date/Time   CHOL 137 04/03/2020 1003   CHOL 205 (H) 12/19/2015 1422   TRIG 95.0 04/03/2020 1003   HDL 62.80 04/03/2020 1003   HDL 84 12/19/2015 1422   CHOLHDL 2 04/03/2020 1003   VLDL 19.0 04/03/2020 1003   LDLCALC 55 04/03/2020 1003   LDLCALC 63 01/01/2019 1238     Physical Exam:    VS:  BP 112/78 (BP Location: Left Arm, Patient Position: Sitting, Cuff Size: Normal)   Pulse 75   Ht 5' (1.524 m)   Wt 100 lb 8 oz (45.6 kg)   SpO2 98%   BMI  19.63 kg/m     Wt Readings from Last 3 Encounters:  06/12/21 100 lb 8 oz  (45.6 kg)  06/04/21 101 lb (45.8 kg)  05/30/21 101 lb (45.8 kg)     GEN:  Well nourished, well developed in no acute distress HEENT: Normal NECK: No JVD; No carotid bruits LYMPHATICS: No lymphadenopathy CARDIAC: RRR, no murmurs, rubs, gallops RESPIRATORY:  Clear to auscultation without rales, wheezing or rhonchi  ABDOMEN: Soft, non-tender, non-distended MUSCULOSKELETAL:  No edema; No deformity  SKIN: Warm and dry NEUROLOGIC:  Alert and oriented x 3 PSYCHIATRIC:  Normal affect   ASSESSMENT:    1. Chest pain, unspecified type   2. Essential hypertension   3. Hyperlipidemia, mixed   4. PVC (premature ventricular contraction)   5. Palpitations   6. Elevated troponin    PLAN:    In order of problems listed above:  Chest discomfort in the setting of COVID-19 with mildly elevated troponin Known coronary artery calcifications Patient had upper chest discomfort in the setting of COVID-19 infection. HS trop 42>36. Since the ER visit the patient has been recovering well, still has some fatigue at times. She denies any exertional chest pain. She is very active for her age and stays busy all day. She has chronic dyspnea on exertion, but this is unchanged. Prior echo 03/2020 showed normal EF, no WMA, normal diastolic parameters, mild MR. Prior MPI 04/2020 showed no significant ischemia or scar, CT attenuation showed aortic atherosclerosis and coronary artery calcifications, low risk. EKG shows NSR with PVCs and nonspecific T wave changes (old). Suspect chest pain more so from COVID infection and elevated troponin likely demand ischemia. Prior studies reassuring as well. Continue Aspirin, statin, Imdur, and BB. I will check an echo. Further ischemic testing can be discussed at follow-up, however unsure this would change her quality of life given lack of symptoms, age, and overall good function.   PVCs/palpitations Patient reports rare palpitations. PVCs noted on EKG. Will check BMET, Mag, TSH, CBC.  I will order a 2 week heart monitor  HTN BP normotensive. Continue Imdur, Hydralazine and toprol.   HLD LDL 55 in 2021. Continue atorvastatin. Can update lipid panel at follow-up  Disposition: Follow up in 2 month(s) with MD/APP    Signed, Sabree Nuon Ninfa Meeker, PA-C  06/12/2021 9:43 AM    [place]

## 2021-06-12 ENCOUNTER — Encounter: Payer: Self-pay | Admitting: Medical

## 2021-06-12 ENCOUNTER — Ambulatory Visit (INDEPENDENT_AMBULATORY_CARE_PROVIDER_SITE_OTHER): Payer: Medicare Other

## 2021-06-12 ENCOUNTER — Other Ambulatory Visit: Payer: Self-pay

## 2021-06-12 ENCOUNTER — Ambulatory Visit (INDEPENDENT_AMBULATORY_CARE_PROVIDER_SITE_OTHER): Payer: Medicare Other | Admitting: Medical

## 2021-06-12 ENCOUNTER — Other Ambulatory Visit
Admission: RE | Admit: 2021-06-12 | Discharge: 2021-06-12 | Disposition: A | Discharge status: Home / Self Care | Payer: Medicare Other | Source: Ambulatory Visit | Attending: Medical | Admitting: Medical

## 2021-06-12 VITALS — BP 112/78 | HR 75 | Ht 60.0 in | Wt 100.5 lb

## 2021-06-12 DIAGNOSIS — R002 Palpitations: Secondary | ICD-10-CM

## 2021-06-12 DIAGNOSIS — I493 Ventricular premature depolarization: Secondary | ICD-10-CM | POA: Insufficient documentation

## 2021-06-12 DIAGNOSIS — I10 Essential (primary) hypertension: Secondary | ICD-10-CM

## 2021-06-12 DIAGNOSIS — R079 Chest pain, unspecified: Secondary | ICD-10-CM

## 2021-06-12 DIAGNOSIS — Z8616 Personal history of COVID-19: Secondary | ICD-10-CM | POA: Insufficient documentation

## 2021-06-12 DIAGNOSIS — E782 Mixed hyperlipidemia: Secondary | ICD-10-CM | POA: Diagnosis not present

## 2021-06-12 DIAGNOSIS — R778 Other specified abnormalities of plasma proteins: Secondary | ICD-10-CM | POA: Diagnosis not present

## 2021-06-12 LAB — BASIC METABOLIC PANEL
Anion gap: 6 (ref 5–15)
BUN: 14 mg/dL (ref 8–23)
CO2: 29 mmol/L (ref 22–32)
Calcium: 9.1 mg/dL (ref 8.9–10.3)
Chloride: 102 mmol/L (ref 98–111)
Creatinine, Ser: 0.54 mg/dL (ref 0.44–1.00)
GFR, Estimated: >60 mL/min (ref >60)
Glucose, Bld: 92 mg/dL (ref 70–99)
Potassium: 4.6 mmol/L (ref 3.5–5.1)
Sodium: 137 mmol/L (ref 135–145)

## 2021-06-12 LAB — MAGNESIUM: Magnesium: 1.9 mg/dL (ref 1.7–2.4)

## 2021-06-12 LAB — CBC
HCT: 43 % (ref 36.0–46.0)
Hemoglobin: 13.9 g/dL (ref 12.0–15.0)
MCH: 29.4 pg (ref 26.0–34.0)
MCHC: 32.3 g/dL (ref 30.0–36.0)
MCV: 91.1 fL (ref 80.0–100.0)
Platelets: 192 10*3/uL (ref 150–400)
RBC: 4.72 MIL/uL (ref 3.87–5.11)
RDW: 12.9 % (ref 11.5–15.5)
WBC: 5.4 10*3/uL (ref 4.0–10.5)
nRBC: 0 % (ref 0.0–0.2)

## 2021-06-12 LAB — TSH: TSH: 1.433 u[IU]/mL (ref 0.350–4.500)

## 2021-06-12 NOTE — Patient Instructions (Signed)
Medication Instructions:   Your physician recommends that you continue on your current medications as directed. Please refer to the Current Medication list given to you today.  *If you need a refill on your cardiac medications before your next appointment, please call your pharmacy*   Lab Work:  Your physician recommends that you have lab work TODAY at the [facility] at [facility]: CBC, BMET, Magnesium, TSH  -  Please go to the [facility]. You will check in at the front desk to the right as you walk into the atrium.    If you have labs (blood work) drawn today and your tests are completely normal, you will receive your results only by: [place] (if you have MyChart) OR A paper copy in the mail If you have any lab test that is abnormal or we need to change your treatment, we will call you to review the results.   Testing/Procedures:  1) Your physician has requested that you have an echocardiogram AFTER wearing heart monitor for TWO WEEKS. Echocardiography is a painless test that uses sound waves to create images of your heart. It provides your doctor with information about the size and shape of your heart and how well your heart's chambers and valves are working. This procedure takes approximately one hour. There are no restrictions for this procedure.  2) Your physician has recommended that you wear a Zio XT monitor for TWO WEEKS.   This monitor is a medical device that records the heart's electrical activity. Doctors most often use these monitors to diagnose arrhythmias. Arrhythmias are problems with the speed or rhythm of the heartbeat. The monitor is a small device applied to your chest. You can wear one while you do your normal daily activities. While wearing this monitor if you have any symptoms to push the button and record what you felt. Once you have worn this monitor for the period of time provider prescribed (Usually 14 days), you will return the monitor device in the  postage paid box. Once it is returned they will download the data collected and provide [place] with a report which the provider will then review and we will call you with those results. Important tips:  Avoid showering during the first 24 hours of wearing the monitor. Avoid excessive sweating to help maximize wear time. Do not submerge the device, no hot tubs, and no swimming pools. Keep any lotions or oils away from the patch. After 24 hours you may shower with the patch on. Take brief showers with your back facing the shower head.  Do not remove patch once it has been placed because that will interrupt data and decrease adhesive wear time. Push the button when you have any symptoms and write down what you were feeling. Once you have completed wearing your monitor, remove and place into box which has postage paid and place in your outgoing mailbox.  If for some reason you have misplaced your box then call our office and we can provide another box and/or mail it off for you.     Follow-Up: At [facility], you and your health needs are our priority.  As part of our continuing mission to provide you with exceptional heart care, we have created designated Provider Care Teams.  These Care Teams include your primary Cardiologist (physician) and Advanced Practice Providers (APPs -  Physician Assistants and Nurse Practitioners) who all work together to provide you with the care you need, when you need it.  We recommend  signing up for the patient portal called "MyChart".  Sign up information is provided on this After Visit Summary.  MyChart is used to connect with patients for Virtual Visits (Telemedicine).  Patients are able to view lab/test results, encounter notes, upcoming appointments, etc.  Non-urgent messages can be sent to your provider as well.   To learn more about what you can do with MyChart, go to [email or web address].    Your next appointment:   2 month(s)  The format for your next  appointment:   In Person  Provider:   You may see Kate Sable, MD or one of the following Advanced Practice Providers on your designated Care Team:   Murray Hodgkins, NP Christell Faith, PA-C Marrianne Mood, PA-C [place], [place]

## 2021-06-17 ENCOUNTER — Ambulatory Visit (INDEPENDENT_AMBULATORY_CARE_PROVIDER_SITE_OTHER): Payer: Medicare Other | Admitting: Gastroenterology

## 2021-06-17 ENCOUNTER — Other Ambulatory Visit: Payer: Self-pay

## 2021-06-17 ENCOUNTER — Encounter: Payer: Self-pay | Admitting: Gastroenterology

## 2021-06-17 VITALS — BP 161/73 | HR 54 | Temp 97.5°F | Ht 60.0 in | Wt 100.2 lb

## 2021-06-17 DIAGNOSIS — R1013 Epigastric pain: Secondary | ICD-10-CM

## 2021-06-17 MED ORDER — FAMOTIDINE 20 MG PO TABS: ORAL_TABLET | ORAL | 0 refills | Status: DC

## 2021-06-17 NOTE — Patient Instructions (Signed)
Take Pepcid 20mg  2 times a day for 1 month and then go down to taking Pecid 20mg  at bedtime. If you have GERD symptoms during the day you can use the Pepcid as needed during the day

## 2021-06-18 ENCOUNTER — Encounter: Payer: Medicare Other | Attending: Physician Assistant | Admitting: Physician Assistant

## 2021-06-18 DIAGNOSIS — I872 Venous insufficiency (chronic) (peripheral): Secondary | ICD-10-CM | POA: Diagnosis not present

## 2021-06-18 DIAGNOSIS — I7389 Other specified peripheral vascular diseases: Secondary | ICD-10-CM | POA: Diagnosis not present

## 2021-06-18 DIAGNOSIS — L97812 Non-pressure chronic ulcer of other part of right lower leg with fat layer exposed: Secondary | ICD-10-CM | POA: Diagnosis not present

## 2021-06-18 DIAGNOSIS — I83893 Varicose veins of bilateral lower extremities with other complications: Secondary | ICD-10-CM | POA: Diagnosis not present

## 2021-06-18 NOTE — Progress Notes (Signed)
[name], MD [address]  [place], [place] [address]  Main: [phone]  Fax: [phone]   Primary Care Physician: Crecencio Mc, MD   Chief Complaint  Patient presents with   Abdominal Pain    Epigastric pain not having no symptoms    Constipation    Not having no constipation     HPI: [name] is a 87 y.o. female here for follow-up of epigastric pain.  Patient feels that her symptoms are much better, she is not waking up at night with symptoms like she was before.  Reports good appetite.  No weight loss.  No nausea or vomiting.  Presents with her daughter today who confirms her history as well  She was previously on omeprazole and is now taking Pepcid twice a day.  Most of her symptoms are at night, but as long as she takes the Pepcid she does well.  During the day she states she only has symptoms when she does things like bending to do gardening   ROS: All ROS reviewed and negative except as per HPI   Past Medical History:  Diagnosis Date   Allergy    Arthritis    Atherosclerosis    Atherosclerosis of abdominal aorta (HCC)    Chronic kidney disease    stage 2   CKD (chronic kidney disease) stage 2, GFR 60-89 ml/min    Collagen vascular disease ([place])    Diverticulosis 2008   GERD (gastroesophageal reflux disease)    Heart murmur    Hyperlipidemia    Lumbar spinal stenosis    Osteoporosis    Ovarian lump    RA (rheumatoid arthritis) ([place])    Raynaud's disease    Rheumatoid arthritis ([place])    Spinal stenosis     Past Surgical History:  Procedure Laterality Date   BREAST SURGERY Left 1965   Benign biopsy   CHOLECYSTECTOMY  2005   JAARS   COLONOSCOPY  2008   Dr. Donnella Sham   EXCISION OF BREAST BIOPSY Left 1965   benign   EYE SURGERY Bilateral    Cataract Extraction with IOL   KYPHOPLASTY N/A 12/17/2016   Procedure: KYPHOPLASTY;  Surgeon: Hessie Knows, MD;  Location: [facility] ORS;  Service: Orthopedics;  Laterality:  N/A;  l4   LUMBAR LAMINECTOMY  05/09/2017   L2-L5   VISCERAL ANGIOGRAPHY N/A 08/15/2017   Procedure: VISCERAL ANGIOGRAPHY;  Surgeon: Algernon Huxley, MD;  Location: [place] [facility];  Service: Cardiovascular;  Laterality: N/A;   VISCERAL ANGIOGRAPHY N/A 10/05/2017   Procedure: VISCERAL ANGIOGRAPHY;  Surgeon: Algernon Huxley, MD;  Location: [place] [facility];  Service: Cardiovascular;  Laterality: N/A;   VISCERAL ARTERY INTERVENTION N/A 08/15/2017   Procedure: VISCERAL ARTERY INTERVENTION;  Surgeon: Algernon Huxley, MD;  Location: [place];  Service: Cardiovascular;  Laterality: N/A;    Prior to Admission medications   Medication Sig Start Date End Date Taking? Authorizing Provider  acetaminophen (TYLENOL) 500 MG tablet Take 500 mg by mouth every 6 (six) hours as needed for mild pain.   Yes [provider]  Alfalfa 500 MG TABS Take 500 mg by mouth daily.   Yes [provider]  Ascorbic Acid (VITAMIN C CR) 500 MG CPCR Take 500 mg by mouth daily.   Yes [provider]  aspirin EC 81 MG EC tablet Take 1 tablet (81 mg total) by mouth every evening. 03/27/20  Yes Lavina Hamman, MD  atorvastatin (LIPITOR) 10 MG tablet TAKE 1 TABLET BY MOUTH ONCE DAILY IN THE EVENING 04/21/21  Yes Crecencio Mc, MD  Calcium Carbonate (CALCIUM 600 PO) Take by mouth.   Yes [provider]  Cholecalciferol (VITAMIN D3) 3000 UNITS TABS Take 3,000 Units by mouth 2 (two) times daily.    Yes [provider]  diclofenac sodium (VOLTAREN) 1 % GEL Apply topically as needed.  08/02/19  Yes [provider]  famotidine (PEPCID) 20 MG tablet Take 1 tablet (20 mg total) by mouth 2 (two) times daily for 30 days, THEN 1 tablet (20 mg total) at bedtime. 06/17/21 08/16/21 Yes Virgel Manifold, MD  fluticasone (FLONASE) 50 MCG/ACT nasal spray Place 1 spray into both nostrils daily.   Yes [provider]  Ginkgo Biloba (GNP GINGKO BILOBA EXTRACT PO) Take 1 tablet by  mouth daily.   Yes [provider]  Glucosamine Sulfate 500 MG CAPS Take 500 mg by mouth daily.   Yes [provider]  hydrALAZINE (APRESOLINE) 10 MG tablet Take 1 tablet (10 mg total) by mouth 3 (three) times daily. As needed for sbp  170 or higher 09/16/20  Yes Crecencio Mc, MD  isosorbide mononitrate (IMDUR) 30 MG 24 hr tablet Take 0.5 tablets (15 mg total) by mouth daily. 05/07/21  Yes Agbor-Etang, Aaron Edelman, MD  Lactobacillus (PROBIOTIC ACIDOPHILUS PO) Take 1 capsule by mouth daily.    Yes [provider]  loratadine (CLARITIN) 10 MG tablet Take 10 mg by mouth daily as needed for allergies.   Yes [provider]  Magnesium 400 MG CAPS Take 400 mg by mouth daily.   Yes [provider]  metoprolol succinate (TOPROL XL) 25 MG 24 hr tablet Take 0.5 tablets (12.5 mg total) by mouth daily. 05/07/21 05/07/22 Yes Agbor-Etang, Aaron Edelman, MD  Multiple Vitamin (MULTIVITAMIN) capsule Take 1 capsule by mouth daily.   Yes [provider]  mupirocin ointment (BACTROBAN) 2 % Apply to right leg wound daily - a small dab as needed once a day. 08/26/20  Yes Marval Regal, NP  nitroGLYCERIN (NITROSTAT) 0.4 MG SL tablet Place 1 tablet (0.4 mg total) under the tongue every 5 (five) minutes as needed for chest pain. 04/22/20  Yes Crecencio Mc, MD  Omega-3 Fatty Acids (OMEGA 3 PO) Take 520 mg by mouth daily.    Yes [provider]  Pramoxine-HC (HYDROCORTISONE ACE-PRAMOXINE) 2.5-1 % CREA Apply topically as needed.    Yes [provider]  Turmeric 450 MG CAPS Take 450 Units by mouth daily.    Yes [provider]  vitamin B-12 (CYANOCOBALAMIN) 1000 MCG tablet Take 1,000 mcg by mouth daily.   Yes [provider]  CINNAMON PO Take 1,000 mg by mouth daily.  Patient not taking: Reported on 06/17/2021    [provider]  clotrimazole-betamethasone (LOTRISONE) cream Apply topically 2 (two) times daily. Patient not taking: No sig  reported 03/14/19   Fredderick Severance, NP    Family History  Problem Relation Age of Onset   Heart disease Mother    Hypertension Father    Heart disease Brother 39       heart attack   Cancer Brother        bladder   Diabetes Sister    Alcohol abuse Sister    Kidney disease Sister    Diabetes Brother    Stroke Brother    Diabetes Maternal Grandmother    Heart disease Maternal Grandmother  Cancer Brother        lung     Social History   Tobacco Use   Smoking status: Never   Smokeless tobacco: Never   Tobacco comments:    smoking cessation materials not required  Vaping Use   Vaping Use: Never used  Substance Use Topics   Alcohol use: No    Alcohol/week: 0.0 standard drinks   Drug use: No    Allergies as of 06/17/2021 - Review Complete 06/17/2021  Allergen Reaction Noted   Ibandronic acid Other (See Comments) 08/14/2015   Other Other (See Comments) 01/19/2012   Prednisone Rash 12/26/2014   Actonel [risedronate] Other (See Comments) 06/19/2014   Amoxicillin Hives 04/15/2020   Pantoprazole Other (See Comments) 08/11/2020   Raloxifene Other (See Comments) 08/14/2015   Versed [midazolam] Other (See Comments) 12/16/2016   Doxycycline Rash 12/26/2014    Physical Examination:  Constitutional: General:   Alert,  Well-developed, well-nourished, pleasant and cooperative in NAD BP (!) 161/73 (BP Location: Left Arm, Patient Position: Sitting, Cuff Size: Normal)   Pulse (!) 54   Temp (!) 97.5 F (36.4 C) (Oral)   Ht 5' (1.524 m)   Wt 100 lb 4 oz (45.5 kg)   BMI 19.58 kg/m   Respiratory: Normal respiratory effort  Gastrointestinal:  Soft, non-tender and non-distended without masses, hepatosplenomegaly or hernias noted.  No guarding or rebound tenderness.     Cardiac: No clubbing or edema.  No cyanosis. Normal posterior tibial pedal pulses noted.  Psych:  Alert and cooperative. Normal mood and affect.  Musculoskeletal:  Normal gait. Head normocephalic,  atraumatic. Symmetrical without gross deformities. 5/5 Lower extremity strength bilaterally.  Skin: Warm. Intact without significant lesions or rashes. No jaundice.  Neck: Supple, trachea midline  Lymph: No cervical lymphadenopathy  Psych:  Alert and oriented x3, Alert and cooperative. Normal mood and affect.  Labs: CMP     Component Value Date/Time   NA 137 06/12/2021 1028   NA 143 12/19/2015 1422   K 4.6 06/12/2021 1028   CL 102 06/12/2021 1028   CO2 29 06/12/2021 1028   GLUCOSE 92 06/12/2021 1028   BUN 14 06/12/2021 1028   BUN 15 12/19/2015 1422   CREATININE 0.54 06/12/2021 1028   CREATININE 0.62 01/26/2019 1542   CALCIUM 9.1 06/12/2021 1028   PROT 6.6 04/01/2021 1528   PROT 6.2 12/19/2015 1422   ALBUMIN 4.3 04/01/2021 1528   ALBUMIN 4.0 12/19/2015 1422   AST 32 04/01/2021 1528   ALT 18 04/01/2021 1528   ALKPHOS 49 04/01/2021 1528   BILITOT 0.4 04/01/2021 1528   BILITOT <0.2 12/19/2015 1422   GFRNONAA >60 06/12/2021 1028   GFRNONAA 80 01/26/2019 1542   GFRAA >60 05/22/2020 1536   GFRAA 93 01/26/2019 1542   Lab Results  Component Value Date   WBC 5.4 06/12/2021   HGB 13.9 06/12/2021   HCT 43.0 06/12/2021   MCV 91.1 06/12/2021   PLT 192 06/12/2021    Imaging Studies:   Assessment and Plan:   Jeniah Kishi is a 87 y.o. y/o female here for follow-up of abdominal pain and reflux  Symptoms improved   I have advised patient to continue Pepcid twice a day for 1 more month given her symptoms are well controlled.  However, after this patient advised to continue the evening or bedtime Pepcid given that most of her symptoms were while she was laying down.  And only take the daytime Pepcid as needed when she is doing activities that  exacerbate her symptoms such as bending to do gardening  If symptoms worsen during this time, patient advised to let us know by giving [place] a call and she is agreeable with this plan  Patient educated extensively on acid reflux  lifestyle modification, using a bed wedge, not eating 3 hrs before bedtime, diet modifications, and handout given for the same.    Dr [name]

## 2021-06-18 NOTE — Progress Notes (Signed)
Sargun, Rummell Dafina T. ([id number]) Visit Report for 06/18/2021 Abuse/Suicide Risk Screen Details Patient Name: [name], [name] Date of Service: 06/18/2021 9:45 AM Medical Record Number: [id number] Patient Account Number: [id number] Date of Birth/Sex: 26-Apr-1927 (87 y.o. F) Treating RN: Donnamarie Poag Primary Care Areonna Bran: Deborra Medina Other Clinician: Referring Francella Barnett: Referral, Self Treating Kelcey Wickstrom/Extender: Skipper Cliche in Treatment: 0 Abuse/Suicide Risk Screen Items Answer ABUSE RISK SCREEN: Has anyone close to you tried to hurt or harm you recentlyo No Do you feel uncomfortable with anyone in your familyo No Has anyone forced you do things that you didnot want to doo No Electronic Signature(s) Signed: 06/18/2021 11:38:51 AM By: Donnamarie Poag Entered By: Donnamarie Poag on 06/18/2021 10:00:34 Franqui, Sevin T. ([id number]) -------------------------------------------------------------------------------- Activities of Daily Living Details Patient Name: [name], [name] Date of Service: 06/18/2021 9:45 AM Medical Record Number: [id number] Patient Account Number: [id number] Date of Birth/Sex: 1927-02-18 (87 y.o. F) Treating RN: Donnamarie Poag Primary Care Leahann Lempke: Deborra Medina Other Clinician: Referring Javien Tesch: Referral, Self Treating Lessie Funderburke/Extender: Skipper Cliche in Treatment: 0 Activities of Daily Living Items Answer Activities of Daily Living (Please select one for each item) Drive Automobile Need Assistance Take Medications Completely Able Use Telephone Completely Able Care for Appearance Completely Able Use Toilet Completely Able Bath / Shower Completely Able Dress Self Completely Able Feed Self Completely Able Walk Completely Able Get In / Out Bed Completely Able Housework Completely Able Prepare Meals Completely [place] for Self Need Assistance Electronic Signature(s) Signed: 06/18/2021 11:38:51 AM By: Donnamarie Poag Entered By:  Donnamarie Poag on 06/18/2021 09:58:48 Matton, Lockie T. ([id number]) -------------------------------------------------------------------------------- Education Screening Details Patient Name: [name], [name] Date of Service: 06/18/2021 9:45 AM Medical Record Number: [id number] Patient Account Number: [id number] Date of Birth/Sex: [birth year] (87 y.o. F) Treating RN: Donnamarie Poag Primary Care Yosiel Thieme: Deborra Medina Other Clinician: Referring Castor Gittleman: Referral, Self Treating Mars Scheaffer/Extender: Skipper Cliche in Treatment: 0 Primary Learner Assessed: Patient Learning Preferences/Education Level/Primary Language Learning Preference: Explanation, Demonstration Highest Education Level: College or Above Preferred Language: English Cognitive Barrier Language Barrier: No Translator Needed: No Memory Deficit: No Emotional Barrier: No Cultural/Religious Beliefs Affecting Medical Care: No Physical Barrier Impaired Vision: No Impaired Hearing: No Decreased Hand dexterity: No Knowledge/Comprehension Knowledge Level: High Comprehension Level: High Ability to understand written instructions: High Ability to understand verbal instructions: High Motivation Anxiety Level: Calm Cooperation: Cooperative Education Importance: Acknowledges Need Interest in Health Problems: Asks Questions Perception: Coherent Willingness to Engage in Self-Management High Activities: Readiness to Engage in Self-Management High Activities: Electronic Signature(s) Signed: 06/18/2021 11:38:51 AM By: Donnamarie Poag Entered By: Donnamarie Poag on 06/18/2021 09:59:29 [name], [name] ([id number]) -------------------------------------------------------------------------------- Fall Risk Assessment Details Patient Name: [name], [name] Date of Service: 06/18/2021 9:45 AM Medical Record Number: [id number] Patient Account Number: [id number] Date of Birth/Sex: [birth year] (87 y.o. F) Treating RN: Donnamarie Poag Primary Care  Ralston Venus: Deborra Medina Other Clinician: Referring Sabreena Vogan: Referral, Self Treating Blakelee Allington/Extender: Skipper Cliche in Treatment: 0 Fall Risk Assessment Items Have you had 2 or more falls in the last 12 monthso 0 No Have you had any fall that resulted in injury in the last 12 monthso 0 No FALLS RISK SCREEN History of falling - immediate or within 3 months 0 No Secondary diagnosis (Do you have 2 or more medical diagnoseso) 0 No Ambulatory aid None/bed rest/wheelchair/nurse 0 Yes Crutches/cane/walker 0 No Furniture 0 No Intravenous therapy Access/Saline/Heparin Lock 0 No Gait/Transferring Normal/ bed rest/ wheelchair 0 Yes Weak (short steps with or without  shuffle, stooped but able to lift head while walking, may 0 No seek support from furniture) Impaired (short steps with shuffle, may have difficulty arising from chair, head down, impaired 0 No balance) Mental Status Oriented to own ability 0 Yes Electronic Signature(s) Signed: 06/18/2021 11:38:51 AM By: Donnamarie Poag Entered By: Donnamarie Poag on 06/18/2021 09:59:46 [name], [name] ([id number]) -------------------------------------------------------------------------------- Foot Assessment Details Patient Name: [name], [name] Date of Service: 06/18/2021 9:45 AM Medical Record Number: [id number] Patient Account Number: [id number] Date of Birth/Sex: [birth year] (87 y.o. F) Treating RN: Donnamarie Poag Primary Care Lorane Cousar: Deborra Medina Other Clinician: Referring Rolondo Pierre: Referral, Self Treating Skilar Marcou/Extender: Skipper Cliche in Treatment: 0 Foot Assessment Items Site Locations + = Sensation present, - = Sensation absent, C = Callus, U = Ulcer R = Redness, W = Warmth, M = Maceration, PU = Pre-ulcerative lesion F = Fissure, S = Swelling, D = Dryness Assessment Right: Left: Other Deformity: No No Prior Foot Ulcer: No No Prior Amputation: No No Charcot Joint: No No Ambulatory Status: Ambulatory Without Help Gait:  Steady Electronic Signature(s) Signed: 06/18/2021 11:38:51 AM By: Donnamarie Poag Entered By: Donnamarie Poag on 06/18/2021 10:03:47 [name], [name] ([id number]) -------------------------------------------------------------------------------- Nutrition Risk Screening Details Patient Name: [name], [name] Date of Service: 06/18/2021 9:45 AM Medical Record Number: [id number] Patient Account Number: [id number] Date of Birth/Sex: 10-29-1927 (87 y.o. F) Treating RN: Donnamarie Poag Primary Care Aceyn Kathol: Deborra Medina Other Clinician: Referring Fronia Depass: Referral, Self Treating Selene Peltzer/Extender: Skipper Cliche in Treatment: 0 Height (in): 60 Weight (lbs): 100 Body Mass Index (BMI): 19.5 Nutrition Risk Screening Items Score Screening NUTRITION RISK SCREEN: I have an illness or condition that made me change the kind and/or amount of food I eat 0 No I eat fewer than two meals per day 0 No I eat few fruits and vegetables, or milk products 0 No I have three or more drinks of beer, liquor or wine almost every day 0 No I have tooth or mouth problems that make it hard for me to eat 0 No I don't always have enough money to buy the food I need 0 No I eat alone most of the time 1 Yes I take three or more different prescribed or over-the-counter drugs a day 1 Yes Without wanting to, I have lost or gained 10 pounds in the last six months 0 No I am not always physically able to shop, cook and/or feed myself 0 No Nutrition Protocols Good Risk Protocol 0 No interventions needed Moderate Risk Protocol High Risk Proctocol Risk Level: Good Risk Score: 2 Electronic Signature(s) Signed: 06/18/2021 11:38:51 AM By: Donnamarie Poag Entered ByDonnamarie Poag on 06/18/2021 10:00:06

## 2021-06-18 NOTE — Progress Notes (Signed)
[name], [name] ([id number]) Visit Report for 06/18/2021 Allergy List Details Patient Name: [name], [name] Date of Service: 06/18/2021 9:45 AM Medical Record Number: [id number] Patient Account Number: [id number] Date of Birth/Sex: Jun 29, 1927 (87 y.o. F) Treating RN: [name] Primary Care [name]: [name] Other Clinician: Referring [name]: Referral, Self Treating [name]/Extender: [name] in Treatment: 0 Allergies Active Allergies amoxicillin Reaction: hives Severity: Moderate doxycycline Reaction: hives Severity: Moderate prednisone Reaction: hives Severity: Moderate Actonel Severity: Moderate pantoprazole ibandronic acid Versed raloxifene Allergy Notes Electronic Signature(s) Signed: 06/18/2021 11:38:51 AM By: [name] Entered By: [name] on 06/18/2021 09:52:05 [name], [name] ([id number]) -------------------------------------------------------------------------------- Arrival Information Details Patient Name: [name], [name] Date of Service: 06/18/2021 9:45 AM Medical Record Number: [id number] Patient Account Number: [id number] Date of Birth/Sex: Jun 11, 1927 (87 y.o. F) Treating RN: [name] Primary Care Jarris Kortz: [name] Other Clinician: Referring [name]: Referral, Self Treating [name]/Extender: [name] in Treatment: 0 Visit Information Patient Arrived: Ambulatory Arrival Time: 09:39 Accompanied By: daughter Transfer Assistance: None Patient Identification Verified: Yes Secondary Verification Process Completed: Yes Patient Has Alerts: Yes Patient Alerts: Patient on Blood Thinner Aspirin 81mg  vascular ABI/TBI 2021 Electronic Signature(s) Signed: 06/18/2021 10:33:31 AM By: [name] Entered By: [name] on 06/18/2021 10:33:31 [name], [name] ([id number]) -------------------------------------------------------------------------------- Clinic Level of Care Assessment Details Patient Name: [name], Brandy  T. Date of Service: 06/18/2021 9:45 AM Medical Record Number: [id number] Patient Account Number: [id number] Date of Birth/Sex: [birth year] (87 y.o. F) Treating RN: [name] Primary Care Raedyn Klinck: [name] Other Clinician: Referring [name]: Referral, Self Treating [name]/Extender: [name] in Treatment: 0 Clinic Level of Care Assessment Items TOOL 1 Quantity Score []  - Use when EandM and Procedure is performed on INITIAL visit 0 ASSESSMENTS - Nursing Assessment / Reassessment X - General Physical Exam (combine w/ comprehensive assessment (listed just below) when performed on new 1 20 pt. evals) []  - 0 Comprehensive Assessment (HX, ROS, Risk Assessments, Wounds Hx, etc.) ASSESSMENTS - Wound and Skin Assessment / Reassessment []  - Dermatologic / Skin Assessment (not related to wound area) 0 ASSESSMENTS - Ostomy and/or Continence Assessment and Care []  - Incontinence Assessment and Management 0 []  - 0 Ostomy Care Assessment and Management (repouching, etc.) PROCESS - Coordination of Care X - Simple Patient / Family Education for ongoing care 1 15 []  - 0 Complex (extensive) Patient / Family Education for ongoing care X- 1 10 Staff obtains Programmer, systems, Records, Test Results / Process Orders []  - 0 Staff telephones HHA, Nursing Homes / Clarify orders / etc []  - 0 Routine Transfer to another Facility (non-emergent condition) []  - 0 Routine Hospital Admission (non-emergent condition) []  - 0 New Admissions / Biomedical engineer / Ordering NPWT, Apligraf, etc. []  - 0 Emergency Hospital Admission (emergent condition) PROCESS - Special Needs []  - Pediatric / Minor Patient Management 0 []  - 0 Isolation Patient Management []  - 0 Hearing / Language / Visual special needs []  - 0 Assessment of Community assistance (transportation, D/C planning, etc.) []  - 0 Additional assistance / Altered mentation []  - 0 Support Surface(s) Assessment (bed, cushion, seat,  etc.) INTERVENTIONS - Miscellaneous []  - External ear exam 0 []  - 0 Patient Transfer (multiple staff / Civil Service fast streamer / Similar devices) []  - 0 Simple Staple / Suture removal (25 or less) []  - 0 Complex Staple / Suture removal (26 or more) []  - 0 Hypo/Hyperglycemic Management (do not check if billed separately) X- 1 15 Ankle / Brachial Index (ABI) - do not check  if billed separately Has the patient been seen at the hospital within the last three years: Yes Total Score: 60 Level Of Care: New/Established - Level 2 [name], [name] ([id number]) Electronic Signature(s) Signed: 06/18/2021 11:38:51 AM By: [name] Entered By: [name] on 06/18/2021 11:17:14 [name], [name] ([id number]) -------------------------------------------------------------------------------- Encounter Discharge Information Details Patient Name: [name], [name] Date of Service: 06/18/2021 9:45 AM Medical Record Number: [id number] Patient Account Number: [id number] Date of Birth/Sex: [birth year] (87 y.o. F) Treating RN: [name] Primary Care [name]: [name] Other Clinician: Referring Akanksha Bellmore: Referral, Self Treating [name]/Extender: [name] in Treatment: 0 Encounter Discharge Information Items Post Procedure Vitals Discharge Condition: Stable Temperature (F): 97.8 Ambulatory Status: Ambulatory Pulse (bpm): 57 Discharge Destination: Home Respiratory Rate (breaths/min): 16 Transportation: Private Auto Blood Pressure (mmHg): 138/74 Accompanied By: self Schedule Follow-up Appointment: Yes Clinical Summary of Care: Electronic Signature(s) Signed: 06/18/2021 11:38:51 AM By: [name] Entered By: [name] on 06/18/2021 11:18:56 [name], [name] ([id number]) -------------------------------------------------------------------------------- Lower Extremity Assessment Details Patient Name: [name], [name] Date of Service: 06/18/2021 9:45 AM Medical Record Number: [id number] Patient  Account Number: [id number] Date of Birth/Sex: Sep 20, 1927 (87 y.o. F) Treating RN: [name] Primary Care Laquinda Moller: [name] Other Clinician: Referring Alegria Dominique: Referral, Self Treating Kennede Lusk/Extender: [name] in Treatment: 0 Edema Assessment Assessed: [Left: No] [Right: Yes] Edema: [Left: N] [Right: o] Calf Left: Right: Point of Measurement: 29 cm From Medial Instep 29 cm Ankle Left: Right: Point of Measurement: 9 cm From Medial Instep 19.4 cm Knee To Floor Left: Right: From Medial Instep 39 cm Vascular Assessment Pulses: Dorsalis Pedis Palpable: [Right:Yes] Blood Pressure: Brachial: [Right:132] Ankle: [Right:Dorsalis Pedis: 64 0.48] Electronic Signature(s) Signed: 06/18/2021 11:38:51 AM By: [name] Entered By: [name] on 06/18/2021 10:13:21 Correll, Nelly T. ([id number]) -------------------------------------------------------------------------------- Multi Wound Chart Details Patient Name: [name], [name] Date of Service: 06/18/2021 9:45 AM Medical Record Number: [id number] Patient Account Number: [id number] Date of Birth/Sex: 1927/03/11 (87 y.o. F) Treating RN: [name] Primary Care Beau Vanduzer: [name] Other Clinician: Referring Diamond Martucci: Referral, Self Treating Nidya Bouyer/Extender: [name] in Treatment: 0 Vital Signs Height(in): 60 Pulse(bpm): 68 Weight(lbs): 100 Blood Pressure(mmHg): 138/74 Body Mass Index(BMI): 20 Temperature(F): 97.8 Respiratory Rate(breaths/min): 16 Photos: [N/A:N/A] Wound Location: Right, Distal, Lateral Lower Leg N/A N/A Wounding Event: Laceration N/A N/A Primary Etiology: Venous Leg Ulcer N/A N/A Comorbid History: Cataracts, Chronic Obstructive N/A N/A Pulmonary Disease (COPD), Arrhythmia, Coronary Artery Disease, Hypertension, Rheumatoid Arthritis, Osteoarthritis Date Acquired: 03/23/2021 N/A N/A Weeks of Treatment: 0 N/A N/A Wound Status: Open N/A N/A Measurements L x W x D (cm)  0.4x0.4x0.1 N/A N/A Area (cm) : 0.126 N/A N/A Volume (cm) : 0.013 N/A N/A Classification: Full Thickness Without Exposed N/A N/A Support Structures Exudate Amount: None Present N/A N/A Wound Margin: Flat and Intact N/A N/A Granulation Amount: None Present (0%) N/A N/A Necrotic Amount: Large (67-100%) N/A N/A Necrotic Tissue: Eschar N/A N/A Treatment Notes Electronic Signature(s) Signed: 06/18/2021 11:38:51 AM By: [name] Entered By: [name] on 06/18/2021 10:45:03 [name], [name] ([id number]) -------------------------------------------------------------------------------- Multi-Disciplinary Care Plan Details Patient Name: [name], [name] Date of Service: 06/18/2021 9:45 AM Medical Record Number: [id number] Patient Account Number: [id number] Date of Birth/Sex: Jan 30, 1927 (87 y.o. F) Treating RN: [name] Primary Care Benett Swoyer: [name] Other Clinician: Referring Jock Mahon: Referral, Self Treating Rivkah Wolz/Extender: [name] in Treatment: 0 Active Inactive Orientation to the Wound Care Program Nursing Diagnoses: Knowledge deficit related to the wound healing center program Goals: Patient/caregiver will  verbalize understanding of the [place] Program Date Initiated: 06/18/2021 Target Resolution Date: 07/08/2021 Goal Status: Active Interventions: Provide education on orientation to the wound center Notes: Wound/Skin Impairment Nursing Diagnoses: Impaired tissue integrity Knowledge deficit related to smoking impact on wound healing Knowledge deficit related to ulceration/compromised skin integrity Goals: Ulcer/skin breakdown will have a volume reduction of 30% by week 4 Date Initiated: 06/18/2021 Target Resolution Date: 07/19/2021 Goal Status: Active Ulcer/skin breakdown will have a volume reduction of 50% by week 8 Date Initiated: 06/18/2021 Target Resolution Date: 08/18/2021 Goal Status: Active Ulcer/skin breakdown will have a volume  reduction of 80% by week 12 Date Initiated: 06/18/2021 Target Resolution Date: 09/18/2021 Goal Status: Active Interventions: Assess patient/caregiver ability to obtain necessary supplies Assess patient/caregiver ability to perform ulcer/skin care regimen upon admission and as needed Assess ulceration(s) every visit Notes: Electronic Signature(s) Signed: 06/18/2021 11:38:51 AM By: [name] Entered By: [name] on 06/18/2021 10:44:43 [name], [name] ([id number]) -------------------------------------------------------------------------------- Pain Assessment Details Patient Name: [name], [name] Date of Service: 06/18/2021 9:45 AM Medical Record Number: [id number] Patient Account Number: [id number] Date of Birth/Sex: December 19, 1926 (87 y.o. F) Treating RN: [name] Primary Care Shaylee Stanislawski: [name] Other Clinician: Referring Tyronica Truxillo: Referral, Self Treating Rhythm Gubbels/Extender: [name] in Treatment: 0 Active Problems Location of Pain Severity and Description of Pain Patient Has Paino No Site Locations Rate the pain. Current Pain Level: 0 Pain Management and Medication Current Pain Management: Electronic Signature(s) Signed: 06/18/2021 11:38:51 AM By: [name] Entered By: [name] on 06/18/2021 09:47:56 Brahm, Baleria T. ([id number]) -------------------------------------------------------------------------------- Patient/Caregiver Education Details Patient Name: Manasco, Selen T. Date of Service: 06/18/2021 9:45 AM Medical Record Number: [id number] Patient Account Number: [id number] Date of Birth/Gender: 09/11/1927 (87 y.o. F) Treating RN: [name] Primary Care Physician: [name] Other Clinician: Referring Physician: Referral, Self Treating Physician/Extender: [name] in Treatment: 0 Education Assessment Education Provided To: Patient and Caregiver Education Topics Provided Basic Hygiene: Nutrition: Venous: Welcome [facility] [place]: Wound Debridement: Wound/Skin Impairment: Electronic Signature(s) Signed: 06/18/2021 11:38:51 AM By: [name] Entered By: [name] on 06/18/2021 10:45:35 Giuliano, Malayla T. ([id number]) -------------------------------------------------------------------------------- Wound Assessment Details Patient Name: Wiltgen, Alveta T. Date of Service: 06/18/2021 9:45 AM Medical Record Number: [id number] Patient Account Number: [id number] Date of Birth/Sex: 21-Jan-1927 (87 y.o. F) Treating RN: [name] Primary Care Katreena Schupp: [name] Other Clinician: Referring Amarien Carne: Referral, Self Treating Graylin Sperling/Extender: [name] in Treatment: 0 Wound Status Wound Number: 1 Primary Venous Leg Ulcer Etiology: Wound Location: Right, Distal, Lateral Lower Leg Wound Open Wounding Event: Laceration Status: Date Acquired: 03/23/2021 Comorbid Cataracts, Chronic Obstructive Pulmonary Disease (COPD), Weeks Of Treatment: 0 History: Arrhythmia, Coronary Artery Disease, Hypertension, Clustered Wound: No Rheumatoid Arthritis, Osteoarthritis Photos Wound Measurements Length: (cm) 0.4 Width: (cm) 0.4 Depth: (cm) 0.1 Area: (cm) 0.126 Volume: (cm) 0.013 % Reduction in Area: % Reduction in Volume: Tunneling: No Undermining: No Wound Description Classification: Full Thickness Without Exposed Support Structures Wound Margin: Flat and Intact Exudate Amount: None Present Foul Odor After Cleansing: No Slough/Fibrino Yes Wound Bed Granulation Amount: None Present (0%) Necrotic Amount: Large (67-100%) Necrotic Quality: Eschar Treatment Notes Wound #1 (Lower Leg) Wound Laterality: Right, Lateral, Distal Cleanser Normal Saline Discharge Instruction: Wash your hands with soap and water. Remove old dressing, discard into plastic bag and place into trash. Cleanse the wound with Normal Saline prior to applying a clean dressing using gauze sponges, not tissues or cotton balls. Do not scrub  or use excessive force.  Pat dry using gauze sponges, not tissue or cotton balls. Soap and Water Discharge Instruction: Gently cleanse wound with antibacterial soap, rinse and pat dry prior to dressing wounds Peri-Wound Care Dimaggio, Trinidad T. ([id number]) Topical Primary Dressing Prisma 4.34 (in) Discharge Instruction: Moisten w/normal saline ; Cover wound as directed. Do not remove from wound bed. Secondary Dressing ABD Pad 5x9 (in/in) Discharge Instruction: Cover with ABD pad [place] Discharge Instruction: Apply Conforming Stretch Guaze Bandage as directed Secured With Tubigrip Size D, 3x10 (in/yd) Compression Wrap Compression Stockings Add-Ons Electronic Signature(s) Signed: 06/18/2021 11:38:51 AM By: [name] Entered By: [name] on 06/18/2021 10:07:58 Vilar, Azyah T. ([id number]) -------------------------------------------------------------------------------- Vitals Details Patient Name: [name], Kenesha T. Date of Service: 06/18/2021 9:45 AM Medical Record Number: [id number] Patient Account Number: [id number] Date of Birth/Sex: 04-Feb-1927 (87 y.o. F) Treating RN: [name] Primary Care Jacquiline Zurcher: [name] Other Clinician: Referring Kolyn Rozario: Referral, Self Treating Dillen Belmontes/Extender: [name] in Treatment: 0 Vital Signs Time Taken: 09:48 Temperature (F): 97.8 Height (in): 60 Pulse (bpm): 57 Source: Stated Respiratory Rate (breaths/min): 16 Weight (lbs): 100 Blood Pressure (mmHg): 138/74 Source: Measured Reference Range: 80 - 120 mg / dl Body Mass Index (BMI): 19.5 Electronic Signature(s) Signed: 06/18/2021 11:38:51 AM By: [name] Entered ByDonnamarie [name] on 06/18/2021 09:48:39

## 2021-06-18 NOTE — Progress Notes (Signed)
[name] ([id number]) Visit Report for 06/18/2021 Chief Complaint Document Details Patient Name: [name], [name] Date of Service: 06/18/2021 9:45 AM Medical Record Number: [id number] Patient Account Number: [id number] Date of Birth/Sex: 23-Aug-1927 (87 y.o. F) Treating RN: Donnamarie Poag Primary Care Provider: Deborra Medina Other Clinician: Referring Provider: Referral, Self Treating Provider/Extender: Skipper Cliche in Treatment: 0 Information Obtained from: Patient Chief Complaint Right LE Ulcer Electronic Signature(s) Signed: 06/18/2021 10:53:35 AM By: Worthy Keeler PA-C Entered By: Worthy Keeler on 06/18/2021 10:53:35 Dulac, Vickey T. ([id number]) -------------------------------------------------------------------------------- Debridement Details Patient Name: [name], [name] Date of Service: 06/18/2021 9:45 AM Medical Record Number: [id number] Patient Account Number: [id number] Date of Birth/Sex: August 31, 1927 (87 y.o. F) Treating RN: Donnamarie Poag Primary Care Provider: Deborra Medina Other Clinician: Referring Provider: Referral, Self Treating Provider/Extender: Skipper Cliche in Treatment: 0 Debridement Performed for Wound #1 Right,Distal,Lateral Lower Leg Assessment: Performed By: Physician Brandy Sams., PA-C Debridement Type: Debridement Severity of Tissue Pre Debridement: Fat layer exposed Level of Consciousness (Pre- Awake and Alert procedure): Pre-procedure Verification/Time Out Yes - 10:59 Taken: Start Time: 10:59 Pain Control: Lidocaine Total Area Debrided (L x W): 0.4 (cm) x 0.4 (cm) = 0.16 (cm) Tissue and other material Viable, Non-Viable, Eschar, Subcutaneous, Skin: Dermis debrided: Level: Skin/Subcutaneous Tissue Debridement Description: Excisional Instrument: Curette Bleeding: Moderate Hemostasis Achieved: Pressure End Time: 11:01 Response to Treatment: Procedure was tolerated well Level of Consciousness (Post- Awake and  Alert procedure): Post Debridement Measurements of Total Wound Length: (cm) 0.4 Width: (cm) 0.4 Depth: (cm) 0.1 Volume: (cm) 0.013 Character of Wound/Ulcer Post Debridement: Improved Severity of Tissue Post Debridement: Fat layer exposed Post Procedure Diagnosis Same as Pre-procedure Electronic Signature(s) Signed: 06/18/2021 11:38:51 AM By: Donnamarie Poag Signed: 06/18/2021 5:38:02 PM By: Worthy Keeler PA-C Entered By: Donnamarie Poag on 06/18/2021 11:01:56 [name], [name] ([id number]) -------------------------------------------------------------------------------- HPI Details Patient Name: [name], [name] Date of Service: 06/18/2021 9:45 AM Medical Record Number: [id number] Patient Account Number: [id number] Date of Birth/Sex: June 20, 1927 (87 y.o. F) Treating RN: Donnamarie Poag Primary Care Provider: Deborra Medina Other Clinician: Referring Provider: Referral, Self Treating Provider/Extender: Skipper Cliche in Treatment: 0 History of Present Illness HPI Description: 06/18/2021 upon evaluation today patient presents for initial evaluation here in our clinic concerning issues that she has been having with a wound on the right lateral lower leg. She has been leaving is pretty much open air she tells me that she is having a very hard time healing. Fortunately there does not appear to be any signs of active infection which is great news she does have significant varicose veins however. She has seen vascular in the past her most recent ABI was on 05/21/2020 and revealed that she had a right ABI of 1.22 with a TBI of 0.72 and on the left an ABI of 1.13 with a TBI of 0.60. With that being said this was a decline especially with regard to the TBI's and where things were previous. Today we did her ABI in the clinic it seemed to show on the right 0.48 there is some question here as to what exactly may be going on and if her blood flow really has diminished that significantly. This wound initially  occurred on May 16 she tells me that she often has issues with injuries on the right leg that just do not seem to want to heal as good as on the left leg. I do think a light debridement to try to get off some of the  eschar would be helpful so we can see #1 how deep this is a number to what the best treatment option would be going forward. Electronic Signature(s) Signed: 06/18/2021 10:52:39 AM By: Worthy Keeler PA-C Entered By: Worthy Keeler on 06/18/2021 10:52:39 Clapham, Vernadine T. ([id number]) -------------------------------------------------------------------------------- Physical Exam Details Patient Name: [name], [name] Date of Service: 06/18/2021 9:45 AM Medical Record Number: [id number] Patient Account Number: [id number] Date of Birth/Sex: 05/11/1927 (87 y.o. F) Treating RN: Donnamarie Poag Primary Care Provider: Deborra Medina Other Clinician: Referring Provider: Referral, Self Treating Provider/Extender: Skipper Cliche in Treatment: 0 Constitutional sitting or standing blood pressure is within target range for patient.. pulse regular and within target range for patient.[name] respirations regular, non- labored and within target range for patient.[name] temperature within target range for patient.. Well-nourished and well-hydrated in no acute distress. Eyes conjunctiva clear no eyelid edema noted. pupils equal round and reactive to light and accommodation. Ears, Nose, Mouth, and Throat no gross abnormality of ear auricles or external auditory canals. normal hearing noted during conversation. mucus membranes moist. Respiratory normal breathing without difficulty. Cardiovascular 1+ dorsalis pedis/posterior tibialis pulses. trace pitting edema of the bilateral lower extremities. Musculoskeletal normal gait and posture. no significant deformity or arthritic changes, no loss or range of motion, no clubbing. Psychiatric this patient is able to make decisions and demonstrates good insight into  disease process. Alert and Oriented x 3. pleasant and cooperative. Notes Upon inspection patient's wound bed actually showed signs of eschar covering this is a very small area. Fortunately there does not appear to be any evidence of active infection which is great news and overall very pleased in that regard. With that being said I do think that a light debridement would be beneficial for the patient to try to help clear away some of the necrotic debris and eschar on the surface of the wound allow this to heal much more effectively and quickly. I did perform a very light debridement to remove the eschar the patient tolerated that without complication I did remove some slough as well down to subcutaneous she had just a slight amount of bleeding again I was trying to be very lightheaded with this as I am not certain her arterial flow is sufficient for a heavy debridement. Nonetheless postdebridement this appears to be doing much better. Electronic Signature(s) Signed: 06/18/2021 11:05:16 AM By: Worthy Keeler PA-C Previous Signature: 06/18/2021 10:54:24 AM Version By: Worthy Keeler PA-C Entered By: Worthy Keeler on 06/18/2021 11:05:16 [name], [name] ([id number]) -------------------------------------------------------------------------------- Physician Orders Details Patient Name: [name], [name] Date of Service: 06/18/2021 9:45 AM Medical Record Number: [id number] Patient Account Number: [id number] Date of Birth/Sex: November 20, 1926 (87 y.o. F) Treating RN: Donnamarie Poag Primary Care Provider: Deborra Medina Other Clinician: Referring Provider: Referral, Self Treating Provider/Extender: Skipper Cliche in Treatment: 0 Verbal / Phone Orders: No Diagnosis Coding ICD-10 Coding Code Description I87.2 Venous insufficiency (chronic) (peripheral) L97.812 Non-pressure chronic ulcer of other part of right lower leg with fat layer exposed I73.89 Other specified peripheral vascular diseases I83.893  Varicose veins of bilateral lower extremities with other complications Follow-up Appointments o Return Appointment in 1 week. o Nurse Visit as needed - call to schedule if needed Bathing/ Shower/ Hygiene o May shower; gently cleanse wound with antibacterial soap, rinse and pat dry prior to dressing wounds - always change dressing and keep dry after showers o No tub bath. Edema Control - Lymphedema / Segmental Compressive Device / Other o Tubigrip single layer  applied. - right leg, over dressing o Patient to wear own compression stockings. Remove compression stockings every night before going to bed and put on every morning when getting up. - left leg o Elevate legs to the level of the heart and pump ankles as often as possible o Elevate leg(s) parallel to the floor when sitting. o DO YOUR BEST to sleep in the bed at night. DO NOT sleep in your recliner. Long hours of sitting in a recliner leads to swelling of the legs and/or potential wounds on your backside. Wound Treatment Wound #1 - Lower Leg Wound Laterality: Right, Lateral, Distal Cleanser: Normal Saline Every Other Day/30 Days Discharge Instructions: Wash your hands with soap and water. Remove old dressing, discard into plastic bag and place into trash. Cleanse the wound with Normal Saline prior to applying a clean dressing using gauze sponges, not tissues or cotton balls. Do not scrub or use excessive force. Pat dry using gauze sponges, not tissue or cotton balls. Cleanser: Soap and Water Every Other Day/30 Days Discharge Instructions: Gently cleanse wound with antibacterial soap, rinse and pat dry prior to dressing wounds Primary Dressing: Prisma 4.34 (in) (Generic) Every Other Day/30 Days Discharge Instructions: Moisten w/normal saline ; Cover wound as directed. Do not remove from wound bed. Primary Dressing: Curad Oil Emulsion Dressing 3x3 (in/in) Every Other Day/30 Days Discharge Instructions: place on top of  collagen Secondary Dressing: ABD Pad 5x9 (in/in) Every Other Day/30 Days Discharge Instructions: Cover with ABD pad Secondary Dressing: Conforming Guaze Roll-Large (DME) (Generic) Every Other Day/30 Days Discharge Instructions: [place] as directed Secured With: Tubigrip Size D, 3x10 (in/yd) Every Other Day/30 Days Consults o Vascular Nuon, Kadyn T. ([id number]) Electronic Signature(s) Signed: 06/18/2021 11:38:51 AM By: Donnamarie Poag Signed: 06/18/2021 5:38:02 PM By: Worthy Keeler PA-C Entered By: Donnamarie Poag on 06/18/2021 11:37:54 [name], [name] ([id number]) -------------------------------------------------------------------------------- Problem List Details Patient Name: [name], [name] Date of Service: 06/18/2021 9:45 AM Medical Record Number: [id number] Patient Account Number: [id number] Date of Birth/Sex: [birth year] (87 y.o. F) Treating RN: Donnamarie Poag Primary Care Provider: Deborra Medina Other Clinician: Referring Provider: Referral, Self Treating Provider/Extender: Skipper Cliche in Treatment: 0 Active Problems ICD-10 Encounter Code Description Active Date MDM Diagnosis I87.2 Venous insufficiency (chronic) (peripheral) 06/18/2021 No Yes L97.812 Non-pressure chronic ulcer of other part of right lower leg with fat layer 06/18/2021 No Yes exposed I73.89 Other specified peripheral vascular diseases 06/18/2021 No Yes I83.893 Varicose veins of bilateral lower extremities with other complications 7/59/1638 No Yes Inactive Problems Resolved Problems Electronic Signature(s) Signed: 06/18/2021 10:53:11 AM By: Worthy Keeler PA-C Entered By: Worthy Keeler on 06/18/2021 10:53:11 [name], [name] ([id number]) -------------------------------------------------------------------------------- Progress Note Details Patient Name: [name], [name] Date of Service: 06/18/2021 9:45 AM Medical Record Number: [id number] Patient Account Number:  [id number] Date of Birth/Sex: 01-15-1927 (87 y.o. F) Treating RN: Donnamarie Poag Primary Care Provider: Deborra Medina Other Clinician: Referring Provider: Referral, Self Treating Provider/Extender: Skipper Cliche in Treatment: 0 Subjective Chief Complaint Information obtained from Patient Right LE Ulcer History of Present Illness (HPI) 06/18/2021 upon evaluation today patient presents for initial evaluation here in our clinic concerning issues that she has been having with a wound on the right lateral lower leg. She has been leaving is pretty much open air she tells me that she is having a very hard time healing. Fortunately there does not appear to be any signs of active infection which is great news she does have significant  varicose veins however. She has seen vascular in the past her most recent ABI was on 05/21/2020 and revealed that she had a right ABI of 1.22 with a TBI of 0.72 and on the left an ABI of 1.13 with a TBI of 0.60. With that being said this was a decline especially with regard to the TBI's and where things were previous. Today we did her ABI in the clinic it seemed to show on the right 0.48 there is some question here as to what exactly may be going on and if her blood flow really has diminished that significantly. This wound initially occurred on May 16 she tells me that she often has issues with injuries on the right leg that just do not seem to want to heal as good as on the left leg. I do think a light debridement to try to get off some of the eschar would be helpful so we can see #1 how deep this is a number to what the best treatment option would be going forward. Patient History Information obtained from Patient. Allergies amoxicillin (Severity: Moderate, Reaction: hives), doxycycline (Severity: Moderate, Reaction: hives), prednisone (Severity: Moderate, Reaction: hives), Actonel (Severity: Moderate), pantoprazole, ibandronic acid, Versed, raloxifene Social  History Never smoker, Marital Status - Widowed, Alcohol Use - Never, Drug Use - No History, Caffeine Use - Rarely. Medical History Eyes Patient has history of Cataracts - hx surgery Respiratory Patient has history of Chronic Obstructive Pulmonary Disease (COPD) Cardiovascular Patient has history of Arrhythmia - follows with cardio/echo planned, Coronary Artery Disease, Hypertension Musculoskeletal Patient has history of Rheumatoid Arthritis, Osteoarthritis Review of Systems (ROS) Constitutional Symptoms ([place]) Denies complaints or symptoms of Fatigue, Fever, Chills, Marked Weight Change. Ear/Nose/Mouth/Throat Complains or has symptoms of Sinusitis. Hematologic/Lymphatic Denies complaints or symptoms of Bleeding / Clotting Disorders, Human Immunodeficiency Virus. Respiratory Complains or has symptoms of Shortness of Breath. Cardiovascular Complains or has symptoms of LE edema. Gastrointestinal Denies complaints or symptoms of Frequent diarrhea, Nausea, Vomiting. Endocrine Denies complaints or symptoms of Hepatitis, Thyroid disease, Polydypsia (Excessive Thirst). Genitourinary Complains or has symptoms of Incontinence/dribbling, hx CKD stage 2 documented Immunological Denies complaints or symptoms of Hives, Itching. Integumentary (Skin) Complains or has symptoms of Breakdown - lower legs, Swelling. Neurologic Denies complaints or symptoms of Numbness/parasthesias, Focal/Weakness. Psychiatric Complains or has symptoms of Anxiety. [name], [name] ([id number]) Objective Constitutional sitting or standing blood pressure is within target range for patient.. pulse regular and within target range for patient.[name] respirations regular, non- labored and within target range for patient.[name] temperature within target range for patient.. Well-nourished and well-hydrated in no acute distress. Vitals Time Taken: 9:48 AM, Height: 60 in, Source: Stated, Weight: 100 lbs, Source: Measured,  BMI: 19.5, Temperature: 97.8 F, Pulse: 57 bpm, Respiratory Rate: 16 breaths/min, Blood Pressure: 138/74 mmHg. Eyes conjunctiva clear no eyelid edema noted. pupils equal round and reactive to light and accommodation. Ears, Nose, Mouth, and Throat no gross abnormality of ear auricles or external auditory canals. normal hearing noted during conversation. mucus membranes moist. Respiratory normal breathing without difficulty. Cardiovascular 1+ dorsalis pedis/posterior tibialis pulses. trace pitting edema of the bilateral lower extremities. Musculoskeletal normal gait and posture. no significant deformity or arthritic changes, no loss or range of motion, no clubbing. Psychiatric this patient is able to make decisions and demonstrates good insight into disease process. Alert and Oriented x 3. pleasant and cooperative. General Notes: Upon inspection patient's wound bed actually showed signs of eschar covering this is a very small area. Fortunately  there does not appear to be any evidence of active infection which is great news and overall very pleased in that regard. With that being said I do think that a light debridement would be beneficial for the patient to try to help clear away some of the necrotic debris and eschar on the surface of the wound allow this to heal much more effectively and quickly. I did perform a very light debridement to remove the eschar the patient tolerated that without complication I did remove some slough as well down to subcutaneous she had just a slight amount of bleeding again I was trying to be very lightheaded with this as I am not certain her arterial flow is sufficient for a heavy debridement. Nonetheless postdebridement this appears to be doing much better. Integumentary (Hair, Skin) Wound #1 status is Open. Original cause of wound was Laceration. The date acquired was: 03/23/2021. The wound is located on the Right,Distal,Lateral Lower Leg. The wound measures  0.4cm length x 0.4cm width x 0.1cm depth; 0.126cm^2 area and 0.013cm^3 volume. There is no tunneling or undermining noted. There is a none present amount of drainage noted. The wound margin is flat and intact. There is no granulation within the wound bed. There is a large (67-100%) amount of necrotic tissue within the wound bed including Eschar. Assessment Active Problems ICD-10 Venous insufficiency (chronic) (peripheral) Non-pressure chronic ulcer of other part of right lower leg with fat layer exposed Other specified peripheral vascular diseases Varicose veins of bilateral lower extremities with other complications Procedures Wound #1 Pre-procedure diagnosis of Wound #1 is a Venous Leg Ulcer located on the Right,Distal,Lateral Lower Leg .Severity of Tissue Pre Debridement is: Fat layer exposed. There was a Excisional Skin/Subcutaneous Tissue Debridement with a total area of 0.16 sq cm performed by Brandy Sams., PA-C. With the following instrument(s): Curette to remove Viable and Non-Viable tissue/material. Material removed includes Eschar, Subcutaneous Tissue, and Skin: Dermis after achieving pain control using Lidocaine. A time out was conducted at 10:59, prior to the start of the procedure. A Moderate amount of bleeding was controlled with Pressure. The procedure was tolerated well. Post Debridement Measurements: Santiago, Kalani T. ([id number]) 0.4cm length x 0.4cm width x 0.1cm depth; 0.013cm^3 volume. Character of Wound/Ulcer Post Debridement is improved. Severity of Tissue Post Debridement is: Fat layer exposed. Post procedure Diagnosis Wound #1: Same as Pre-Procedure Plan 1. Would recommend currently that we actually go ahead and initiate treatment with a silver collagen dressing I think this can be the best way to go. 2. I am also can recommend that we use oil emulsion over top to keep it from drying out. 3. I would also suggest that we use Tubigrip to secure everything in place and  help with some compression as well this will be light until we can get the arterial study. 4. I am also can recommend arterial study with ABI and TBI to be checked as well. Her 1 from previous in July 2021 showed to be okay but again she did showed decline from the one that was previously done. We will see patient back for reevaluation in 1 week here in the clinic. If anything worsens or changes patient will contact our office for additional recommendations. Electronic Signature(s) Signed: 06/18/2021 11:06:16 AM By: Worthy Keeler PA-C Entered By: Worthy Keeler on 06/18/2021 11:06:16 Cortese, Linna T. ([id number]) -------------------------------------------------------------------------------- ROS/PFSH Details Patient Name: [name], [name] Date of Service: 06/18/2021 9:45 AM Medical Record Number: [id number] Patient Account Number: [id number] Date  of Birth/Sex: 09-03-1927 (87 y.o. F) Treating RN: Donnamarie Poag Primary Care Provider: Deborra Medina Other Clinician: Referring Provider: Referral, Self Treating Provider/Extender: Skipper Cliche in Treatment: 0 Information Obtained From Patient Constitutional Symptoms (General Health) Complaints and Symptoms: Negative for: Fatigue; Fever; Chills; Marked Weight Change Ear/Nose/Mouth/Throat Complaints and Symptoms: Positive for: Sinusitis Hematologic/Lymphatic Complaints and Symptoms: Negative for: Bleeding / Clotting Disorders; Human Immunodeficiency Virus Respiratory Complaints and Symptoms: Positive for: Shortness of Breath Medical History: Positive for: Chronic Obstructive Pulmonary Disease (COPD) Cardiovascular Complaints and Symptoms: Positive for: LE edema Medical History: Positive for: Arrhythmia - follows with cardio/echo planned; Coronary Artery Disease; Hypertension Gastrointestinal Complaints and Symptoms: Negative for: Frequent diarrhea; Nausea; Vomiting Endocrine Complaints and Symptoms: Negative for: Hepatitis;  Thyroid disease; Polydypsia (Excessive Thirst) Genitourinary Complaints and Symptoms: Positive for: Incontinence/dribbling Review of System Notes: hx CKD stage 2 documented Immunological Complaints and Symptoms: Negative for: Hives; Itching Integumentary (Skin) Mihelich, Kairee T. ([id number]) Complaints and Symptoms: Positive for: Breakdown - lower legs; Swelling Neurologic Complaints and Symptoms: Negative for: Numbness/parasthesias; Focal/Weakness Psychiatric Complaints and Symptoms: Positive for: Anxiety Eyes Medical History: Positive for: Cataracts - hx surgery Musculoskeletal Medical History: Positive for: Rheumatoid Arthritis; Osteoarthritis Oncologic HBO Extended History Items Eyes: Cataracts Immunizations Pneumococcal Vaccine: Received Pneumococcal Vaccination: Yes Received Pneumococcal Vaccination On or After 60th Birthday: Yes Implantable Devices None Family and Social History Never smoker; Marital Status - Widowed; Alcohol Use: Never; Drug Use: No History; Caffeine Use: Rarely; Financial Concerns: No; Food, Clothing or Shelter Needs: No; Support System Lacking: No; Transportation Concerns: No Electronic Signature(s) Signed: 06/18/2021 11:38:51 AM By: Donnamarie Poag Signed: 06/18/2021 5:38:02 PM By: Worthy Keeler PA-C Entered By: Donnamarie Poag on 06/18/2021 09:58:08 Stemm, Achaia T. ([id number]) -------------------------------------------------------------------------------- SuperBill Details Patient Name: Kok, Leshonda T. Date of Service: 06/18/2021 Medical Record Number: [id number] Patient Account Number: [id number] Date of Birth/Sex: [birth year] (87 y.o. F) Treating RN: Donnamarie Poag Primary Care Provider: Deborra Medina Other Clinician: Referring Provider: Referral, Self Treating Provider/Extender: Skipper Cliche in Treatment: 0 Diagnosis Coding ICD-10 Codes Code Description I87.2 Venous insufficiency (chronic) (peripheral) L97.812 Non-pressure chronic  ulcer of other part of right lower leg with fat layer exposed I73.89 Other specified peripheral vascular diseases I83.893 Varicose veins of bilateral lower extremities with other complications Facility Procedures CPT4 Code: [id number] Description: 88502 - DEB SUBQ TISSUE 20 SQ CM/< Modifier: Quantity: 1 CPT4 Code: Description: ICD-10 Diagnosis Description D74.128 Non-pressure chronic ulcer of other part of right lower leg with fat lay Modifier: er exposed Quantity: Physician Procedures CPT4 Code: 7867672 Description: 99204 - WC PHYS LEVEL 4 - NEW PT Modifier: 25 Quantity: 1 CPT4 Code: Description: ICD-10 Diagnosis Description I87.2 Venous insufficiency (chronic) (peripheral) L97.812 Non-pressure chronic ulcer of other part of right lower leg with fat lay I73.89 Other specified peripheral vascular diseases I83.893 Varicose veins of  bilateral lower extremities with other complications Modifier: er exposed Quantity: CPT4 Code: [id number] Description: 28366 - WC PHYS SUBQ TISS 20 SQ CM Modifier: Quantity: 1 CPT4 Code: Description: ICD-10 Diagnosis Description 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

## 2021-06-19 ENCOUNTER — Other Ambulatory Visit (INDEPENDENT_AMBULATORY_CARE_PROVIDER_SITE_OTHER): Payer: Self-pay | Admitting: Physician Assistant

## 2021-06-19 DIAGNOSIS — L97812 Non-pressure chronic ulcer of other part of right lower leg with fat layer exposed: Secondary | ICD-10-CM

## 2021-06-19 DIAGNOSIS — I83893 Varicose veins of bilateral lower extremities with other complications: Secondary | ICD-10-CM

## 2021-06-19 DIAGNOSIS — I872 Venous insufficiency (chronic) (peripheral): Secondary | ICD-10-CM

## 2021-06-22 ENCOUNTER — Other Ambulatory Visit: Payer: Self-pay

## 2021-06-22 ENCOUNTER — Ambulatory Visit (INDEPENDENT_AMBULATORY_CARE_PROVIDER_SITE_OTHER): Payer: Medicare Other

## 2021-06-22 DIAGNOSIS — L97812 Non-pressure chronic ulcer of other part of right lower leg with fat layer exposed: Secondary | ICD-10-CM

## 2021-06-22 DIAGNOSIS — I872 Venous insufficiency (chronic) (peripheral): Secondary | ICD-10-CM | POA: Diagnosis not present

## 2021-06-22 DIAGNOSIS — I83893 Varicose veins of bilateral lower extremities with other complications: Secondary | ICD-10-CM

## 2021-06-25 ENCOUNTER — Encounter: Payer: Medicare Other | Admitting: Physician Assistant

## 2021-06-25 ENCOUNTER — Other Ambulatory Visit: Payer: Self-pay

## 2021-06-25 DIAGNOSIS — I872 Venous insufficiency (chronic) (peripheral): Secondary | ICD-10-CM | POA: Diagnosis not present

## 2021-06-25 DIAGNOSIS — I7389 Other specified peripheral vascular diseases: Secondary | ICD-10-CM | POA: Diagnosis not present

## 2021-06-25 DIAGNOSIS — L97812 Non-pressure chronic ulcer of other part of right lower leg with fat layer exposed: Secondary | ICD-10-CM | POA: Diagnosis not present

## 2021-06-25 DIAGNOSIS — I83893 Varicose veins of bilateral lower extremities with other complications: Secondary | ICD-10-CM | POA: Diagnosis not present

## 2021-06-25 NOTE — Progress Notes (Addendum)
[name], [name] ([id number]) Visit Report for 06/25/2021 Chief Complaint Document Details Patient Name: [name], [name] Date of Service: 06/25/2021 2:15 PM Medical Record Number: [id number] Patient Account Number: [id number] Date of Birth/Sex: 07-Jul-1927 (87 y.o. F) Treating RN: Donnamarie Poag Primary Care Provider: Deborra Medina Other Clinician: Referring Provider: Deborra Medina Treating Provider/Extender: Skipper Cliche in Treatment: 1 Information Obtained from: Patient Chief Complaint Right LE Ulcer Electronic Signature(s) Signed: 06/25/2021 2:24:50 PM By: Worthy Keeler PA-C Entered By: Worthy Keeler on 06/25/2021 14:24:50 [name], [name] ([id number]) -------------------------------------------------------------------------------- Debridement Details Patient Name: [name], [name] Date of Service: 06/25/2021 2:15 PM Medical Record Number: [id number] Patient Account Number: [id number] Date of Birth/Sex: [birth year] (87 y.o. F) Treating RN: Donnamarie Poag Primary Care Provider: Deborra Medina Other Clinician: Referring Provider: Deborra Medina Treating Provider/Extender: Skipper Cliche in Treatment: 1 Debridement Performed for Wound #1 Right,Distal,Lateral Lower Leg Assessment: Performed By: Physician Tommie Sams., PA-C Debridement Type: Debridement Severity of Tissue Pre Debridement: Fat layer exposed Level of Consciousness (Pre- Awake and Alert procedure): Pre-procedure Verification/Time Out Yes - 15:05 Taken: Start Time: 15:05 Pain Control: Lidocaine Total Area Debrided (L x W): 0.3 (cm) x 0.2 (cm) = 0.06 (cm) Tissue and other material Viable, Non-Viable, Slough, Subcutaneous, Biofilm, Slough debrided: Level: Skin/Subcutaneous Tissue Debridement Description: Excisional Instrument: Curette Bleeding: Minimum Hemostasis Achieved: Pressure End Time: 15:08 Response to Treatment: Procedure was tolerated well Level of Consciousness (Post- Awake and  Alert procedure): Post Debridement Measurements of Total Wound Length: (cm) 0.3 Width: (cm) 0.2 Depth: (cm) 0.1 Volume: (cm) 0.005 Character of Wound/Ulcer Post Debridement: Improved Severity of Tissue Post Debridement: Fat layer exposed Post Procedure Diagnosis Same as Pre-procedure Electronic Signature(s) Signed: 06/25/2021 4:48:07 PM By: Worthy Keeler PA-C Signed: 06/25/2021 4:53:42 PM By: Donnamarie Poag Entered By: Donnamarie Poag on 06/25/2021 15:07:40 [name], [name] ([id number]) -------------------------------------------------------------------------------- HPI Details Patient Name: [name], [name] Date of Service: 06/25/2021 2:15 PM Medical Record Number: [id number] Patient Account Number: [id number] Date of Birth/Sex: Jan 10, 1927 (87 y.o. F) Treating RN: Donnamarie Poag Primary Care Provider: Deborra Medina Other Clinician: Referring Provider: Deborra Medina Treating Provider/Extender: Skipper Cliche in Treatment: 1 History of Present Illness HPI Description: 06/18/2021 upon evaluation today patient presents for initial evaluation here in our clinic concerning issues that she has been having with a wound on the right lateral lower leg. She has been leaving is pretty much open air she tells me that she is having a very hard time healing. Fortunately there does not appear to be any signs of active infection which is great news she does have significant varicose veins however. She has seen vascular in the past her most recent ABI was on 05/21/2020 and revealed that she had a right ABI of 1.22 with a TBI of 0.72 and on the left an ABI of 1.13 with a TBI of 0.60. With that being said this was a decline especially with regard to the TBI's and where things were previous. Today we did her ABI in the clinic it seemed to show on the right 0.48 there is some question here as to what exactly may be going on and if her blood flow really has diminished that significantly. This wound initially  occurred on May 16 she tells me that she often has issues with injuries on the right leg that just do not seem to want to heal as good as on the left leg. I do think a light debridement to try to get off some of the  eschar would be helpful so we can see #1 how deep this is a number to what the best treatment option would be going forward. 06/25/2021 upon evaluation today patient appears to be doing well with regard to her wound on the leg. Fortunately there does not appear to be any signs of active infection there was a little bit of necrotic tissue in the base of the wound that I did perform sharp debridement in regard to today with a curette. She tolerated the debridement without complication and postdebridement the wound bed appears to be doing better. Overall however I think that this is definitely showing signs of improving compared to where she was initially on inspection. Electronic Signature(s) Signed: 06/25/2021 3:11:27 PM By: Worthy Keeler PA-C Entered By: Worthy Keeler on 06/25/2021 15:11:26 [name], [name] ([id number]) -------------------------------------------------------------------------------- Physical Exam Details Patient Name: [name], [name] Date of Service: 06/25/2021 2:15 PM Medical Record Number: [id number] Patient Account Number: [id number] Date of Birth/Sex: 12/10/1926 (87 y.o. F) Treating RN: Donnamarie Poag Primary Care Provider: Deborra Medina Other Clinician: Referring Provider: Deborra Medina Treating Provider/Extender: Skipper Cliche in Treatment: 1 Constitutional Well-nourished and well-hydrated in no acute distress. Respiratory normal breathing without difficulty. Psychiatric this patient is able to make decisions and demonstrates good insight into disease process. Alert and Oriented x 3. pleasant and cooperative. Notes Patient's wound bed did have some slough and biofilm noted on the surface of the wound sharp debridement was undertaken today to clear  this away and the patient tolerated that today without complication. Post debridement the wound bed appears to be doing much better which is great news. Electronic Signature(s) Signed: 06/25/2021 3:11:47 PM By: Worthy Keeler PA-C Entered By: Worthy Keeler on 06/25/2021 15:11:47 [name], [name] ([id number]) -------------------------------------------------------------------------------- Physician Orders Details Patient Name: [name], [name] Date of Service: 06/25/2021 2:15 PM Medical Record Number: [id number] Patient Account Number: [id number] Date of Birth/Sex: [birth year] (87 y.o. F) Treating RN: Donnamarie Poag Primary Care Provider: Deborra Medina Other Clinician: Referring Provider: Deborra Medina Treating Provider/Extender: Skipper Cliche in Treatment: 1 Verbal / Phone Orders: No Diagnosis Coding ICD-10 Coding Code Description I87.2 Venous insufficiency (chronic) (peripheral) L97.812 Non-pressure chronic ulcer of other part of right lower leg with fat layer exposed I73.89 Other specified peripheral vascular diseases I83.893 Varicose veins of bilateral lower extremities with other complications Follow-up Appointments o Return Appointment in 1 week. o Nurse Visit as needed - call to schedule if needed Bathing/ Shower/ Hygiene o May shower; gently cleanse wound with antibacterial soap, rinse and pat dry prior to dressing wounds - always change dressing and keep dry after showers o No tub bath. Edema Control - Lymphedema / Segmental Compressive Device / Other o Tubigrip single layer applied. - right leg, over dressing o Patient to wear own compression stockings. Remove compression stockings every night before going to bed and put on every morning when getting up. - left leg o Elevate legs to the level of the heart and pump ankles as often as possible o Elevate leg(s) parallel to the floor when sitting. o DO YOUR BEST to sleep in the bed at night. DO NOT sleep  in your recliner. Long hours of sitting in a recliner leads to swelling of the legs and/or potential wounds on your backside. o Other: - Dermasaver recommended if legs are exposed to avoid breakdown-see handout Wound Treatment Wound #1 - Lower Leg Wound Laterality: Right, Lateral, Distal Cleanser: Normal Saline Every Other Day/30 Days Discharge Instructions: Wash your  hands with soap and water. Remove old dressing, discard into plastic bag and place into trash. Cleanse the wound with Normal Saline prior to applying a clean dressing using gauze sponges, not tissues or cotton balls. Do not scrub or use excessive force. Pat dry using gauze sponges, not tissue or cotton balls. Cleanser: Soap and Water Every Other Day/30 Days Discharge Instructions: Gently cleanse wound with antibacterial soap, rinse and pat dry prior to dressing wounds Primary Dressing: Prisma 4.34 (in) (Generic) Every Other Day/30 Days Discharge Instructions: Moisten w/normal saline ; Cover wound as directed. Do not remove from wound bed. Primary Dressing: Curad Oil Emulsion Dressing 3x3 (in/in) Every Other Day/30 Days Discharge Instructions: place on top of collagen Secondary Dressing: ABD Pad 5x9 (in/in) Every Other Day/30 Days Discharge Instructions: Cover with ABD pad Secondary Dressing: Conforming Guaze Roll-Large (Generic) Every Other Day/30 Days Discharge Instructions: [place] as directed Secured With: Tubigrip Size D, 3x10 (in/yd) Every Other Day/30 Days Electronic Signature(s) Signed: 06/25/2021 4:48:07 PM By: Precious Haws, Takhia T. ([id number]) Signed: 06/25/2021 4:53:42 PM By: Donnamarie Poag Entered By: Donnamarie Poag on 06/25/2021 15:06:29 Keim, Cedar T. ([id number]) -------------------------------------------------------------------------------- Problem List Details Patient Name: Loncar, [name] Date of Service: 06/25/2021 2:15 PM Medical Record Number:  [id number] Patient Account Number: [id number] Date of Birth/Sex: [birth year] (87 y.o. F) Treating RN: Donnamarie Poag Primary Care Provider: Deborra Medina Other Clinician: Referring Provider: Deborra Medina Treating Provider/Extender: Skipper Cliche in Treatment: 1 Active Problems ICD-10 Encounter Code Description Active Date MDM Diagnosis I87.2 Venous insufficiency (chronic) (peripheral) 06/18/2021 No Yes L97.812 Non-pressure chronic ulcer of other part of right lower leg with fat layer 06/18/2021 No Yes exposed I73.89 Other specified peripheral vascular diseases 06/18/2021 No Yes I83.893 Varicose veins of bilateral lower extremities with other complications 02/01/7123 No Yes Inactive Problems Resolved Problems Electronic Signature(s) Signed: 06/25/2021 2:24:32 PM By: Worthy Keeler PA-C Entered By: Worthy Keeler on 06/25/2021 14:24:32 [name], [name] ([id number]) -------------------------------------------------------------------------------- Progress Note Details Patient Name: [name], [name] Date of Service: 06/25/2021 2:15 PM Medical Record Number: [id number] Patient Account Number: [id number] Date of Birth/Sex: Jun 19, 1927 (87 y.o. F) Treating RN: Donnamarie Poag Primary Care Provider: Deborra Medina Other Clinician: Referring Provider: Deborra Medina Treating Provider/Extender: Skipper Cliche in Treatment: 1 Subjective Chief Complaint Information obtained from Patient Right LE Ulcer History of Present Illness (HPI) 06/18/2021 upon evaluation today patient presents for initial evaluation here in our clinic concerning issues that she has been having with a wound on the right lateral lower leg. She has been leaving is pretty much open air she tells me that she is having a very hard time healing. Fortunately there does not appear to be any signs of active infection which is great news she does have significant varicose veins however. She has seen vascular in the past her most recent  ABI was on 05/21/2020 and revealed that she had a right ABI of 1.22 with a TBI of 0.72 and on the left an ABI of 1.13 with a TBI of 0.60. With that being said this was a decline especially with regard to the TBI's and where things were previous. Today we did her ABI in the clinic it seemed to show on the right 0.48 there is some question here as to what exactly may be going on and if her blood flow really has diminished that significantly. This wound initially occurred on May 16 she tells me that she often has issues with injuries on  the right leg that just do not seem to want to heal as good as on the left leg. I do think a light debridement to try to get off some of the eschar would be helpful so we can see #1 how deep this is a number to what the best treatment option would be going forward. 06/25/2021 upon evaluation today patient appears to be doing well with regard to her wound on the leg. Fortunately there does not appear to be any signs of active infection there was a little bit of necrotic tissue in the base of the wound that I did perform sharp debridement in regard to today with a curette. She tolerated the debridement without complication and postdebridement the wound bed appears to be doing better. Overall however I think that this is definitely showing signs of improving compared to where she was initially on inspection. Objective Constitutional Well-nourished and well-hydrated in no acute distress. Vitals Time Taken: 2:22 PM, Height: 60 in, Weight: 100 lbs, BMI: 19.5, Temperature: 98 F, Pulse: 76 bpm, Respiratory Rate: 16 breaths/min, Blood Pressure: 126/71 mmHg. Respiratory normal breathing without difficulty. Psychiatric this patient is able to make decisions and demonstrates good insight into disease process. Alert and Oriented x 3. pleasant and cooperative. General Notes: Patient's wound bed did have some slough and biofilm noted on the surface of the wound sharp debridement was  undertaken today to clear this away and the patient tolerated that today without complication. Post debridement the wound bed appears to be doing much better which is great news. Integumentary (Hair, Skin) Wound #1 status is Open. Original cause of wound was Laceration. The date acquired was: 03/23/2021. The wound has been in treatment 1 weeks. The wound is located on the Right,Distal,Lateral Lower Leg. The wound measures 0.3cm length x 0.2cm width x 0.1cm depth; 0.047cm^2 area and 0.005cm^3 volume. There is no tunneling or undermining noted. There is a none present amount of drainage noted. The wound margin is flat and intact. There is large (67-100%) granulation within the wound bed. There is a small (1-33%) amount of necrotic tissue within the wound bed including Eschar and Adherent Slough. Assessment Active Problems Smolinski, [name] ([id number]) ICD-10 Venous insufficiency (chronic) (peripheral) Non-pressure chronic ulcer of other part of right lower leg with fat layer exposed Other specified peripheral vascular diseases Varicose veins of bilateral lower extremities with other complications Procedures Wound #1 Pre-procedure diagnosis of Wound #1 is a Venous Leg Ulcer located on the Right,Distal,Lateral Lower Leg .Severity of Tissue Pre Debridement is: Fat layer exposed. There was a Excisional Skin/Subcutaneous Tissue Debridement with a total area of 0.06 sq cm performed by Tommie Sams., PA-C. With the following instrument(s): Curette to remove Viable and Non-Viable tissue/material. Material removed includes Subcutaneous Tissue, Slough, and Biofilm after achieving pain control using Lidocaine. A time out was conducted at 15:05, prior to the start of the procedure. A Minimum amount of bleeding was controlled with Pressure. The procedure was tolerated well. Post Debridement Measurements: 0.3cm length x 0.2cm width x 0.1cm depth; 0.005cm^3 volume. Character of Wound/Ulcer Post Debridement is  improved. Severity of Tissue Post Debridement is: Fat layer exposed. Post procedure Diagnosis Wound #1: Same as Pre-Procedure Plan Follow-up Appointments: Return Appointment in 1 week. Nurse Visit as needed - call to schedule if needed Bathing/ Shower/ Hygiene: May shower; gently cleanse wound with antibacterial soap, rinse and pat dry prior to dressing wounds - always change dressing and keep dry after showers No tub bath. Edema Control -  Lymphedema / Segmental Compressive Device / Other: Tubigrip single layer applied. - right leg, over dressing Patient to wear own compression stockings. Remove compression stockings every night before going to bed and put on every morning when getting up. - left leg Elevate legs to the level of the heart and pump ankles as often as possible Elevate leg(s) parallel to the floor when sitting. DO YOUR BEST to sleep in the bed at night. DO NOT sleep in your recliner. Long hours of sitting in a recliner leads to swelling of the legs and/or potential wounds on your backside. Other: - Dermasaver recommended if legs are exposed to avoid breakdown-see handout WOUND #1: - Lower Leg Wound Laterality: Right, Lateral, Distal Cleanser: Normal Saline Every Other Day/30 Days Discharge Instructions: Wash your hands with soap and water. Remove old dressing, discard into plastic bag and place into trash. Cleanse the wound with Normal Saline prior to applying a clean dressing using gauze sponges, not tissues or cotton balls. Do not scrub or use excessive force. Pat dry using gauze sponges, not tissue or cotton balls. Cleanser: Soap and Water Every Other Day/30 Days Discharge Instructions: Gently cleanse wound with antibacterial soap, rinse and pat dry prior to dressing wounds Primary Dressing: Prisma 4.34 (in) (Generic) Every Other Day/30 Days Discharge Instructions: Moisten w/normal saline ; Cover wound as directed. Do not remove from wound bed. Primary Dressing: Curad  Oil Emulsion Dressing 3x3 (in/in) Every Other Day/30 Days Discharge Instructions: place on top of collagen Secondary Dressing: ABD Pad 5x9 (in/in) Every Other Day/30 Days Discharge Instructions: Cover with ABD pad Secondary Dressing: Conforming Guaze Roll-Large (Generic) Every Other Day/30 Days Discharge Instructions: [place] as directed Secured With: Tubigrip Size D, 3x10 (in/yd) Every Other Day/30 Days 1. Would recommend currently that we go ahead and continue with the silver collagen dressing I think this is still the best way to go. 2. I am also can recommend that we have the patient continue with the oil emulsion to cover which I think is also a good thing. 3. We will continue with an ABD pad followed by Tubigrip which I think is perfect. 4. The patient did have her evaluation with vascular but that has not been released yet so they told her everything was okay but I do not have the actual report hopefully we should have that by next week. We will see patient back for reevaluation in 1 week here in the clinic. If anything worsens or changes patient will contact our office for additional recommendations. TREADWELL, Nonna T. ([id number]) Electronic Signature(s) Signed: 06/25/2021 3:12:33 PM By: Worthy Keeler PA-C Entered By: Worthy Keeler on 06/25/2021 15:12:32 Tolen, Pearlene T. ([id number]) -------------------------------------------------------------------------------- SuperBill Details Patient Name: [name], [name] Date of Service: 06/25/2021 Medical Record Number: [id number] Patient Account Number: [id number] Date of Birth/Sex: 01/30/1927 (87 y.o. F) Treating RN: Donnamarie Poag Primary Care Provider: Deborra Medina Other Clinician: Referring Provider: Deborra Medina Treating Provider/Extender: Skipper Cliche in Treatment: 1 Diagnosis Coding ICD-10 Codes Code Description I87.2 Venous insufficiency (chronic) (peripheral) L97.812 Non-pressure chronic  ulcer of other part of right lower leg with fat layer exposed I73.89 Other specified peripheral vascular diseases I83.893 Varicose veins of bilateral lower extremities with other complications Facility Procedures CPT4 Code: [id number] Description: 27782 - DEB SUBQ TISSUE 20 SQ CM/< Modifier: Quantity: 1 CPT4 Code: Description: ICD-10 Diagnosis Description U23.536 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

## 2021-06-25 NOTE — Progress Notes (Signed)
Charlie, Char Channel T. ([id number]) Visit Report for 06/25/2021 Arrival Information Details Patient Name: STURGELL, [name] Date of Service: 06/25/2021 2:15 PM Medical Record Number: [id number] Patient Account Number: [id number] Date of Birth/Sex: 1926/12/24 (87 y.o. F) Treating RN: Donnamarie Poag Primary Care Earland Reish: Deborra Medina Other Clinician: Referring Marrietta Thunder: Deborra Medina Treating Cote Mayabb/Extender: Skipper Cliche in Treatment: 1 Visit Information History Since Last Visit Added or deleted any medications: No Patient Arrived: Ambulatory Had a fall or experienced change in No Arrival Time: 14:19 activities of daily living that may affect Accompanied By: daughter risk of falls: Transfer Assistance: None Hospitalized since last visit: No Patient Identification Verified: Yes Has Dressing in Place as Prescribed: Yes Secondary Verification Process Completed: Yes Pain Present Now: No Patient Has Alerts: Yes Patient Alerts: Patient on Blood Thinner Aspirin 81mg  vascular ABI/TBI 2021 Electronic Signature(s) Signed: 06/25/2021 4:53:42 PM By: Donnamarie Poag Entered By: Donnamarie Poag on 06/25/2021 14:22:27 Renteria, Fareedah T. ([id number]) -------------------------------------------------------------------------------- Clinic Level of Care Assessment Details Patient Name: Scicchitano, Kennedey T. Date of Service: 06/25/2021 2:15 PM Medical Record Number: [id number] Patient Account Number: [id number] Date of Birth/Sex: 07-20-1927 (87 y.o. F) Treating RN: Donnamarie Poag Primary Care Byrl Latin: Deborra Medina Other Clinician: Referring Lizandro Spellman: Deborra Medina Treating Cornella Emmer/Extender: Skipper Cliche in Treatment: 1 Clinic Level of Care Assessment Items TOOL 1 Quantity Score []  - Use when EandM and Procedure is performed on INITIAL visit 0 ASSESSMENTS - Nursing Assessment / Reassessment []  - General Physical Exam (combine w/ comprehensive assessment (listed just below) when performed on new 0 pt.  evals) []  - 0 Comprehensive Assessment (HX, ROS, Risk Assessments, Wounds Hx, etc.) ASSESSMENTS - Wound and Skin Assessment / Reassessment []  - Dermatologic / Skin Assessment (not related to wound area) 0 ASSESSMENTS - Ostomy and/or Continence Assessment and Care []  - Incontinence Assessment and Management 0 []  - 0 Ostomy Care Assessment and Management (repouching, etc.) PROCESS - Coordination of Care []  - Simple Patient / Family Education for ongoing care 0 []  - 0 Complex (extensive) Patient / Family Education for ongoing care []  - 0 Staff obtains Consents, Records, Test Results / Process Orders []  - 0 Staff telephones HHA, Nursing Homes / Clarify orders / etc []  - 0 Routine Transfer to another Facility (non-emergent condition) []  - 0 Routine Hospital Admission (non-emergent condition) []  - 0 New Admissions / Biomedical engineer / Ordering NPWT, Apligraf, etc. []  - 0 Emergency Hospital Admission (emergent condition) PROCESS - Special Needs []  - Pediatric / Minor Patient Management 0 []  - 0 Isolation Patient Management []  - 0 Hearing / Language / Visual special needs []  - 0 Assessment of Community assistance (transportation, D/C planning, etc.) []  - 0 Additional assistance / Altered mentation []  - 0 Support Surface(s) Assessment (bed, cushion, seat, etc.) INTERVENTIONS - Miscellaneous []  - External ear exam 0 []  - 0 Patient Transfer (multiple staff / Civil Service fast streamer / Similar devices) []  - 0 Simple Staple / Suture removal (25 or less) []  - 0 Complex Staple / Suture removal (26 or more) []  - 0 Hypo/Hyperglycemic Management (do not check if billed separately) []  - 0 Ankle / Brachial Index (ABI) - do not check if billed separately Has the patient been seen at the hospital within the last three years: Yes Total Score: 0 Level Of Care: ____ Ellie Lunch ([id number]) Electronic Signature(s) Signed: 06/25/2021 4:53:42 PM By: Donnamarie Poag Entered By: Donnamarie Poag on  06/25/2021 15:07:56 Hessling, Tonja T. ([id number]) -------------------------------------------------------------------------------- Encounter Discharge Information Details Patient Name: Harmon, Keli T. Date  of Service: 06/25/2021 2:15 PM Medical Record Number: [id number] Patient Account Number: [id number] Date of Birth/Sex: September 11, 1927 (87 y.o. F) Treating RN: Donnamarie Poag Primary Care Deseray Daponte: Deborra Medina Other Clinician: Referring Louise Victory: Deborra Medina Treating Aurora Rody/Extender: Skipper Cliche in Treatment: 1 Encounter Discharge Information Items Post Procedure Vitals Discharge Condition: Stable Temperature (F): 98.0 Ambulatory Status: Ambulatory Pulse (bpm): 76 Discharge Destination: Home Respiratory Rate (breaths/min): 16 Transportation: Private Auto Blood Pressure (mmHg): 126/71 Accompanied By: daughter Schedule Follow-up Appointment: Yes Clinical Summary of Care: Electronic Signature(s) Signed: 06/25/2021 4:53:42 PM By: Donnamarie Poag Entered By: Donnamarie Poag on 06/25/2021 15:09:05 Pistole, Shivani T. ([id number]) -------------------------------------------------------------------------------- Lower Extremity Assessment Details Patient Name: Narang, Naraly T. Date of Service: 06/25/2021 2:15 PM Medical Record Number: [id number] Patient Account Number: [id number] Date of Birth/Sex: 09-07-1927 (87 y.o. F) Treating RN: Donnamarie Poag Primary Care Estell Puccini: Deborra Medina Other Clinician: Referring Ejay Lashley: Deborra Medina Treating Shawntelle Ungar/Extender: Jeri Cos Weeks in Treatment: 1 Edema Assessment Assessed: [Left: No] [Right: Yes] Edema: [Left: N] [Right: o] Vascular Assessment Pulses: Dorsalis Pedis Palpable: [Right:Yes] Electronic Signature(s) Signed: 06/25/2021 4:53:42 PM By: Donnamarie Poag Entered By: Donnamarie Poag on 06/25/2021 14:34:52 Bribiesca, Leshay T. ([id number]) -------------------------------------------------------------------------------- Multi Wound Chart  Details Patient Name: Searls, Lylla T. Date of Service: 06/25/2021 2:15 PM Medical Record Number: [id number] Patient Account Number: [id number] Date of Birth/Sex: 08-Sep-1927 (87 y.o. F) Treating RN: Donnamarie Poag Primary Care Dacoda Finlay: Deborra Medina Other Clinician: Referring Aleshka Corney: Deborra Medina Treating Nariya Neumeyer/Extender: Skipper Cliche in Treatment: 1 Vital Signs Height(in): 60 Pulse(bpm): 72 Weight(lbs): 100 Blood Pressure(mmHg): 126/71 Body Mass Index(BMI): 20 Temperature(F): 98 Respiratory Rate(breaths/min): 16 Photos: [N/A:N/A] Wound Location: Right, Distal, Lateral Lower Leg N/A N/A Wounding Event: Laceration N/A N/A Primary Etiology: Venous Leg Ulcer N/A N/A Comorbid History: Cataracts, Chronic Obstructive N/A N/A Pulmonary Disease (COPD), Arrhythmia, Coronary Artery Disease, Hypertension, Rheumatoid Arthritis, Osteoarthritis Date Acquired: 03/23/2021 N/A N/A Weeks of Treatment: 1 N/A N/A Wound Status: Open N/A N/A Measurements L x W x D (cm) 0.3x0.2x0.1 N/A N/A Area (cm) : 0.047 N/A N/A Volume (cm) : 0.005 N/A N/A % Reduction in Area: 62.70% N/A N/A % Reduction in Volume: 61.50% N/A N/A Classification: Full Thickness Without Exposed N/A N/A Support Structures Exudate Amount: None Present N/A N/A Wound Margin: Flat and Intact N/A N/A Granulation Amount: Large (67-100%) N/A N/A Necrotic Amount: Small (1-33%) N/A N/A Necrotic Tissue: Eschar, Adherent Slough N/A N/A Treatment Notes Electronic Signature(s) Signed: 06/25/2021 4:53:42 PM By: Donnamarie Poag Entered By: Donnamarie Poag on 06/25/2021 14:35:37 Pinho, Maniyah T. ([id number]) -------------------------------------------------------------------------------- Multi-Disciplinary Care Plan Details Patient Name: Spagnolo, Livianna T. Date of Service: 06/25/2021 2:15 PM Medical Record Number: [id number] Patient Account Number: [id number] Date of Birth/Sex: 1927-07-01 (87 y.o. F) Treating RN: Donnamarie Poag Primary Care  Atsushi Yom: Deborra Medina Other Clinician: Referring Takoda Janowiak: Deborra Medina Treating Alexandru Moorer/Extender: Skipper Cliche in Treatment: 1 Active Inactive Wound/Skin Impairment Nursing Diagnoses: Impaired tissue integrity Knowledge deficit related to smoking impact on wound healing Knowledge deficit related to ulceration/compromised skin integrity Goals: Ulcer/skin breakdown will have a volume reduction of 30% by week 4 Date Initiated: 06/18/2021 Target Resolution Date: 07/19/2021 Goal Status: Active Ulcer/skin breakdown will have a volume reduction of 50% by week 8 Date Initiated: 06/18/2021 Target Resolution Date: 08/18/2021 Goal Status: Active Ulcer/skin breakdown will have a volume reduction of 80% by week 12 Date Initiated: 06/18/2021 Target Resolution Date: 09/18/2021 Goal Status: Active Interventions: Assess patient/caregiver ability to obtain necessary supplies Assess patient/caregiver ability to perform ulcer/skin care regimen upon admission and as needed Assess  ulceration(s) every visit Notes: Electronic Signature(s) Signed: 06/25/2021 4:53:42 PM By: Donnamarie Poag Entered By: Donnamarie Poag on 06/25/2021 14:35:05 Depp, Eryka T. ([id number]) -------------------------------------------------------------------------------- Pain Assessment Details Patient Name: Scheffler, Bettey T. Date of Service: 06/25/2021 2:15 PM Medical Record Number: [id number] Patient Account Number: [id number] Date of Birth/Sex: February 26, 1927 (87 y.o. F) Treating RN: Donnamarie Poag Primary Care Zohair Epp: Deborra Medina Other Clinician: Referring Malvin Morrish: Deborra Medina Treating Greig Altergott/Extender: Skipper Cliche in Treatment: 1 Active Problems Location of Pain Severity and Description of Pain Patient Has Paino No Site Locations Rate the pain. Current Pain Level: 0 Pain Management and Medication Current Pain Management: Electronic Signature(s) Signed: 06/25/2021 4:53:42 PM By: Donnamarie Poag Entered By:  Donnamarie Poag on 06/25/2021 14:28:43 Moccio, Breslin T. ([id number]) -------------------------------------------------------------------------------- Patient/Caregiver Education Details Patient Name: Brierley, Greenly T. Date of Service: 06/25/2021 2:15 PM Medical Record Number: [id number] Patient Account Number: [id number] Date of Birth/Gender: 12/06/1926 (87 y.o. F) Treating RN: Donnamarie Poag Primary Care Physician: Deborra Medina Other Clinician: Referring Physician: Deborra Medina Treating Physician/Extender: Skipper Cliche in Treatment: 1 Education Assessment Education Provided To: Patient Education Topics Provided Basic Hygiene: Nutrition: Wound Debridement: Wound/Skin Impairment: Electronic Signature(s) Signed: 06/25/2021 4:53:42 PM By: Donnamarie Poag Entered By: Donnamarie Poag on 06/25/2021 15:08:18 Hitchcock, Florabel T. ([id number]) -------------------------------------------------------------------------------- Wound Assessment Details Patient Name: Farnworth, Deniece T. Date of Service: 06/25/2021 2:15 PM Medical Record Number: [id number] Patient Account Number: [id number] Date of Birth/Sex: [birth year] (87 y.o. F) Treating RN: Donnamarie Poag Primary Care Dervin Vore: Deborra Medina Other Clinician: Referring Joline Encalada: Deborra Medina Treating Marcellene Shivley/Extender: Skipper Cliche in Treatment: 1 Wound Status Wound Number: 1 Primary Venous Leg Ulcer Etiology: Wound Location: Right, Distal, Lateral Lower Leg Wound Open Wounding Event: Laceration Status: Date Acquired: 03/23/2021 Comorbid Cataracts, Chronic Obstructive Pulmonary Disease (COPD), Weeks Of Treatment: 1 History: Arrhythmia, Coronary Artery Disease, Hypertension, Clustered Wound: No Rheumatoid Arthritis, Osteoarthritis Photos Wound Measurements Length: (cm) 0.3 Width: (cm) 0.2 Depth: (cm) 0.1 Area: (cm) 0.047 Volume: (cm) 0.005 % Reduction in Area: 62.7% % Reduction in Volume: 61.5% Tunneling: No Undermining: No Wound  Description Classification: Full Thickness Without Exposed Support Structures Wound Margin: Flat and Intact Exudate Amount: None Present Foul Odor After Cleansing: No Slough/Fibrino Yes Wound Bed Granulation Amount: Large (67-100%) Necrotic Amount: Small (1-33%) Necrotic Quality: Eschar, Adherent Slough Treatment Notes Wound #1 (Lower Leg) Wound Laterality: Right, Lateral, Distal Cleanser Normal Saline Discharge Instruction: Wash your hands with soap and water. Remove old dressing, discard into plastic bag and place into trash. Cleanse the wound with Normal Saline prior to applying a clean dressing using gauze sponges, not tissues or cotton balls. Do not scrub or use excessive force. Pat dry using gauze sponges, not tissue or cotton balls. Soap and Water Discharge Instruction: Gently cleanse wound with antibacterial soap, rinse and pat dry prior to dressing wounds Peri-Wound Care Stucki, Rashaun T. ([id number]) Topical Primary Dressing Prisma 4.34 (in) Discharge Instruction: Moisten w/normal saline ; Cover wound as directed. Do not remove from wound bed. Curad Oil Emulsion Dressing 3x3 (in/in) Discharge Instruction: place on top of collagen Secondary Dressing ABD Pad 5x9 (in/in) Discharge Instruction: Cover with ABD pad [place] Discharge Instruction: Apply Conforming Stretch Guaze Bandage as directed Secured With Tubigrip Size D, 3x10 (in/yd) Compression Wrap Compression Stockings Add-Ons Electronic Signature(s) Signed: 06/25/2021 4:53:42 PM By: Donnamarie Poag Entered By: Donnamarie Poag on 06/25/2021 14:34:30 Corbitt, Maridel T. ([id number]) -------------------------------------------------------------------------------- Vitals Details Patient Name: Ortlieb, Dalani T. Date of Service: 06/25/2021 2:15 PM Medical Record Number: [id number] Patient Account Number:  [id number] Date of Birth/Sex: 08/28/1927 (87 y.o. F) Treating RN: Donnamarie Poag Primary Care Nishant Schrecengost: Deborra Medina Other Clinician: Referring Olayinka Gathers: Deborra Medina Treating Remedios Mckone/Extender: Skipper Cliche in Treatment: 1 Vital Signs Time Taken: 14:22 Temperature (F): 98 Height (in): 60 Pulse (bpm): 76 Weight (lbs): 100 Respiratory Rate (breaths/min): 16 Body Mass Index (BMI): 19.5 Blood Pressure (mmHg): 126/71 Reference Range: 80 - 120 mg / dl Electronic Signature(s) Signed: 06/25/2021 4:53:42 PM By: Donnamarie Poag Entered ByDonnamarie Poag on 06/25/2021 06:23:76

## 2021-06-30 DIAGNOSIS — I493 Ventricular premature depolarization: Secondary | ICD-10-CM | POA: Diagnosis not present

## 2021-06-30 DIAGNOSIS — R002 Palpitations: Secondary | ICD-10-CM | POA: Diagnosis not present

## 2021-07-02 ENCOUNTER — Encounter: Payer: Medicare Other | Admitting: Physician Assistant

## 2021-07-02 ENCOUNTER — Other Ambulatory Visit: Payer: Self-pay

## 2021-07-02 DIAGNOSIS — I7389 Other specified peripheral vascular diseases: Secondary | ICD-10-CM | POA: Diagnosis not present

## 2021-07-02 DIAGNOSIS — I872 Venous insufficiency (chronic) (peripheral): Secondary | ICD-10-CM | POA: Diagnosis not present

## 2021-07-02 DIAGNOSIS — L97812 Non-pressure chronic ulcer of other part of right lower leg with fat layer exposed: Secondary | ICD-10-CM | POA: Diagnosis not present

## 2021-07-02 DIAGNOSIS — I83893 Varicose veins of bilateral lower extremities with other complications: Secondary | ICD-10-CM | POA: Diagnosis not present

## 2021-07-02 NOTE — Progress Notes (Addendum)
[name] ([id number]) Visit Report for 07/02/2021 Chief Complaint Document Details Patient Name: [name], [name] Date of Service: 07/02/2021 8:30 AM Medical Record Number: [id number] Patient Account Number: [id number] Date of Birth/Sex: December 06, 1926 (87 y.o. F) Treating RN: Cornell Barman Primary Care Provider: Deborra Medina Other Clinician: Referring Provider: Deborra Medina Treating Provider/Extender: Skipper Cliche in Treatment: 2 Information Obtained from: Patient Chief Complaint Right LE Ulcer Electronic Signature(s) Signed: 07/02/2021 8:48:52 AM By: Worthy Keeler PA-C Entered By: Worthy Keeler on 07/02/2021 08:48:51 [name], [name] ([id number]) -------------------------------------------------------------------------------- HPI Details Patient Name: [name], [name] Date of Service: 07/02/2021 8:30 AM Medical Record Number: [id number] Patient Account Number: [id number] Date of Birth/Sex: [birth year] (87 y.o. F) Treating RN: Cornell Barman Primary Care Provider: Deborra Medina Other Clinician: Referring Provider: Deborra Medina Treating Provider/Extender: Skipper Cliche in Treatment: 2 History of Present Illness HPI Description: 06/18/2021 upon evaluation today patient presents for initial evaluation here in our clinic concerning issues that she has been having with a wound on the right lateral lower leg. She has been leaving is pretty much open air she tells me that she is having a very hard time healing. Fortunately there does not appear to be any signs of active infection which is great news she does have significant varicose veins however. She has seen vascular in the past her most recent ABI was on 05/21/2020 and revealed that she had a right ABI of 1.22 with a TBI of 0.72 and on the left an ABI of 1.13 with a TBI of 0.60. With that being said this was a decline especially with regard to the TBI's and where things were previous. Today we did her ABI in the clinic it seemed  to show on the right 0.48 there is some question here as to what exactly may be going on and if her blood flow really has diminished that significantly. This wound initially occurred on May 16 she tells me that she often has issues with injuries on the right leg that just do not seem to want to heal as good as on the left leg. I do think a light debridement to try to get off some of the eschar would be helpful so we can see #1 how deep this is a number to what the best treatment option would be going forward. 06/25/2021 upon evaluation today patient appears to be doing well with regard to her wound on the leg. Fortunately there does not appear to be any signs of active infection there was a little bit of necrotic tissue in the base of the wound that I did perform sharp debridement in regard to today with a curette. She tolerated the debridement without complication and postdebridement the wound bed appears to be doing better. Overall however I think that this is definitely showing signs of improving compared to where she was initially on inspection. 07/02/2021 upon evaluation today patient appears to be doing well with regard to her wound on the leg. In fact this appears to be completely healed which is great news. Fortunately there is no signs of active infection at this time. No fevers, chills, nausea, vomiting, or diarrhea. Electronic Signature(s) Signed: 07/02/2021 9:18:41 AM By: Worthy Keeler PA-C Entered By: Worthy Keeler on 07/02/2021 09:18:41 [name], [name] ([id number]) -------------------------------------------------------------------------------- Physical Exam Details Patient Name: [name], [name] Date of Service: 07/02/2021 8:30 AM Medical Record Number: [id number] Patient Account Number: [id number] Date of Birth/Sex: 05-02-1927 (87 y.o. F) Treating RN: Cornell Barman Primary  Care Provider: Deborra Medina Other Clinician: Referring Provider: Deborra Medina Treating Provider/Extender:  Skipper Cliche in Treatment: 2 Constitutional Well-nourished and well-hydrated in no acute distress. Respiratory normal breathing without difficulty. Psychiatric this patient is able to make decisions and demonstrates good insight into disease process. Alert and Oriented x 3. pleasant and cooperative. Notes Upon inspection patient's wound bed actually showed signs of good epithelization at this point. Fortunately there does not appear to be any signs of active infection at this time which is great news and overall very pleased with where things stand today. No fevers, chills, nausea, vomiting, or diarrhea. Electronic Signature(s) Signed: 07/02/2021 9:19:04 AM By: Worthy Keeler PA-C Entered By: Worthy Keeler on 07/02/2021 09:19:04 [name], [name] ([id number]) -------------------------------------------------------------------------------- Physician Orders Details Patient Name: [name], [name] Date of Service: 07/02/2021 8:30 AM Medical Record Number: [id number] Patient Account Number: [id number] Date of Birth/Sex: 1927-03-09 (87 y.o. F) Treating RN: Cornell Barman Primary Care Provider: Deborra Medina Other Clinician: Referring Provider: Deborra Medina Treating Provider/Extender: Skipper Cliche in Treatment: 2 Verbal / Phone Orders: No Diagnosis Coding ICD-10 Coding Code Description I87.2 Venous insufficiency (chronic) (peripheral) L97.812 Non-pressure chronic ulcer of other part of right lower leg with fat layer exposed I73.89 Other specified peripheral vascular diseases I83.893 Varicose veins of bilateral lower extremities with other complications Discharge From [facility] o Discharge from [place] [facility] o Wear compression garments daily. Put garments on first thing when you wake up and remove them before bed. o Moisturize legs daily after removing compression garments. o Elevate, Exercise Daily and Avoid Standing for Long Periods of  Time. Follow-up Appointments o Other: - as needed Electronic Signature(s) Signed: 07/02/2021 3:56:40 PM By: Worthy Keeler PA-C Signed: 07/03/2021 4:00:05 PM By: Gretta Cool, BSN, RN, CWS, Kim RN, BSN Entered By: Gretta Cool, BSN, RN, CWS, Kim on 07/02/2021 09:12:23 [name], [name] ([id number]) -------------------------------------------------------------------------------- Problem List Details Patient Name: [name], [name] Date of Service: 07/02/2021 8:30 AM Medical Record Number: [id number] Patient Account Number: [id number] Date of Birth/Sex: 03-18-1927 (87 y.o. F) Treating RN: Cornell Barman Primary Care Provider: Deborra Medina Other Clinician: Referring Provider: Deborra Medina Treating Provider/Extender: Skipper Cliche in Treatment: 2 Active Problems ICD-10 Encounter Code Description Active Date MDM Diagnosis I87.2 Venous insufficiency (chronic) (peripheral) 06/18/2021 No Yes L97.812 Non-pressure chronic ulcer of other part of right lower leg with fat layer 06/18/2021 No Yes exposed I73.89 Other specified peripheral vascular diseases 06/18/2021 No Yes I83.893 Varicose veins of bilateral lower extremities with other complications 3/00/9233 No Yes Inactive Problems Resolved Problems Electronic Signature(s) Signed: 07/02/2021 8:48:45 AM By: Worthy Keeler PA-C Entered By: Worthy Keeler on 07/02/2021 08:48:44 [name], [name] ([id number]) -------------------------------------------------------------------------------- Progress Note Details Patient Name: [name], [name] Date of Service: 07/02/2021 8:30 AM Medical Record Number: [id number] Patient Account Number: [id number] Date of Birth/Sex: May 04, 1927 (87 y.o. F) Treating RN: Cornell Barman Primary Care Provider: Deborra Medina Other Clinician: Referring Provider: Deborra Medina Treating Provider/Extender: Skipper Cliche in Treatment: 2 Subjective Chief Complaint Information obtained from Patient Right LE Ulcer History of Present  Illness (HPI) 06/18/2021 upon evaluation today patient presents for initial evaluation here in our clinic concerning issues that she has been having with a wound on the right lateral lower leg. She has been leaving is pretty much open air she tells me that she is having a very hard time healing. Fortunately there does not appear to be any signs of active infection which is great news she does have  significant varicose veins however. She has seen vascular in the past her most recent ABI was on 05/21/2020 and revealed that she had a right ABI of 1.22 with a TBI of 0.72 and on the left an ABI of 1.13 with a TBI of 0.60. With that being said this was a decline especially with regard to the TBI's and where things were previous. Today we did her ABI in the clinic it seemed to show on the right 0.48 there is some question here as to what exactly may be going on and if her blood flow really has diminished that significantly. This wound initially occurred on May 16 she tells me that she often has issues with injuries on the right leg that just do not seem to want to heal as good as on the left leg. I do think a light debridement to try to get off some of the eschar would be helpful so we can see #1 how deep this is a number to what the best treatment option would be going forward. 06/25/2021 upon evaluation today patient appears to be doing well with regard to her wound on the leg. Fortunately there does not appear to be any signs of active infection there was a little bit of necrotic tissue in the base of the wound that I did perform sharp debridement in regard to today with a curette. She tolerated the debridement without complication and postdebridement the wound bed appears to be doing better. Overall however I think that this is definitely showing signs of improving compared to where she was initially on inspection. 07/02/2021 upon evaluation today patient appears to be doing well with regard to her wound on  the leg. In fact this appears to be completely healed which is great news. Fortunately there is no signs of active infection at this time. No fevers, chills, nausea, vomiting, or diarrhea. Objective Constitutional Well-nourished and well-hydrated in no acute distress. Vitals Time Taken: 8:43 AM, Height: 60 in, Weight: 100 lbs, BMI: 19.5, Temperature: 97.8 F, Pulse: 64 bpm, Respiratory Rate: 16 breaths/min, Blood Pressure: 137/69 mmHg. Respiratory normal breathing without difficulty. Psychiatric this patient is able to make decisions and demonstrates good insight into disease process. Alert and Oriented x 3. pleasant and cooperative. General Notes: Upon inspection patient's wound bed actually showed signs of good epithelization at this point. Fortunately there does not appear to be any signs of active infection at this time which is great news and overall very pleased with where things stand today. No fevers, chills, nausea, vomiting, or diarrhea. Integumentary (Hair, Skin) Wound #1 status is Healed - Epithelialized. Original cause of wound was Laceration. The date acquired was: 03/23/2021. The wound has been in treatment 2 weeks. The wound is located on the Right,Distal,Lateral Lower Leg. The wound measures 0cm length x 0cm width x 0cm depth; 0cm^2 area and 0cm^3 volume. There is no tunneling or undermining noted. There is a none present amount of drainage noted. The wound margin is flat and intact. There is no granulation within the wound bed. There is no necrotic tissue within the wound bed. Assessment [name], [name] ([id number]) Active Problems ICD-10 Venous insufficiency (chronic) (peripheral) Non-pressure chronic ulcer of other part of right lower leg with fat layer exposed Other specified peripheral vascular diseases Varicose veins of bilateral lower extremities with other complications Plan Discharge From [facility]: Discharge from [place] [facility] Wear  compression garments daily. Put garments on first thing when you wake up and remove them  before bed. Moisturize legs daily after removing compression garments. Elevate, Exercise Daily and Avoid Standing for Long Periods of Time. Follow-up Appointments: Other: - as needed 1. Would recommend that we go ahead and currently discontinue wound care services as the patient appears to be completely healed and she is in agreement with this plan. We will go ahead and plan to just see her back as needed in the future if anything changes. Patient is in agreement with that plan. 2. I am also can recommend she should be wearing compression socks I did suggest that whether they are a zipper or even pull up that this will be something that would definitely be beneficial for her keep swelling under control. We will see her back for follow-up visit as needed. Electronic Signature(s) Signed: 07/02/2021 9:21:47 AM By: Worthy Keeler PA-C Entered By: Worthy Keeler on 07/02/2021 09:21:47 [name], [name] ([id number]) -------------------------------------------------------------------------------- SuperBill Details Patient Name: [name], [name] Date of Service: 07/02/2021 Medical Record Number: [id number] Patient Account Number: [id number] Date of Birth/Sex: Jun 01, 1927 (87 y.o. F) Treating RN: Cornell Barman Primary Care Provider: Deborra Medina Other Clinician: Referring Provider: Deborra Medina Treating Provider/Extender: Skipper Cliche in Treatment: 2 Diagnosis Coding ICD-10 Codes Code Description I87.2 Venous insufficiency (chronic) (peripheral) L97.812 Non-pressure chronic ulcer of other part of right lower leg with fat layer exposed I73.89 Other specified peripheral vascular diseases I83.893 Varicose veins of bilateral lower extremities with other complications Facility Procedures CPT4 Code: [id number] Description: [phone] - WOUND CARE VISIT-LEV 2 EST PT Modifier: Quantity: 1 Physician Procedures CPT4  Code: [id number] Description: 56314 - WC PHYS LEVEL 3 - EST PT Modifier: Quantity: 1 CPT4 Code: Description: ICD-10 Diagnosis Description I87.2 Venous insufficiency (chronic) (peripheral) L97.812 Non-pressure chronic ulcer of other part of right lower leg with fat la I73.89 Other specified peripheral vascular diseases I83.893 Varicose veins of  bilateral lower extremities with other complications Modifier: yer exposed Quantity: Electronic Signature(s) Signed: 07/02/2021 9:24:04 AM By: Worthy Keeler PA-C Entered By: Worthy Keeler on 07/02/2021 09:24:04

## 2021-07-03 ENCOUNTER — Telehealth: Payer: Self-pay | Admitting: Medical

## 2021-07-03 NOTE — Telephone Encounter (Signed)
I spoke with the patient regarding her heart monitor results. 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  The patient advised that she felt as though she was having quite a few of these and started reading up on her current medications. She read that her famotidine may cause palpitations, so she stopped this a few days ago and is feeling some better.   I have advised her that she may continue to monitor her symptoms for a few more days off the famotidine, but if her symptoms worsen, then she has the option to increase the famotidine to a full 25 mg once daily.   The patient voices understanding and is agreeable.  I have asked her to please call back if she increases the metoprolol so we may change her medication list.   She is agreeable.

## 2021-07-03 NOTE — Telephone Encounter (Signed)
Cadence Ninfa Meeker, PA-C  07/03/2021  9:30 AM EDT     Heart monitor showed predominately NSR, average rate 67bpm, occasional SVT rare ectopy (irregular beats), triggered events associated with PVCs.  increase Toprol to 25mg  daily for further repression of PVCs

## 2021-07-03 NOTE — Progress Notes (Signed)
LUPIE, SAWA ([id number]) Visit Report for 07/02/2021 Arrival Information Details Patient Name: [name], [name] Date of Service: 07/02/2021 8:30 AM Medical Record Number: [id number] Patient Account Number: [id number] Date of Birth/Sex: 02/07/1927 (87 y.o. F) Treating RN: Cornell Barman Primary Care Amayrany Cafaro: Deborra Medina Other Clinician: Referring Zoriah Pulice: Deborra Medina Treating Sohum Delillo/Extender: Skipper Cliche in Treatment: 2 Visit Information History Since Last Visit Has Dressing in Place as Prescribed: No Patient Arrived: Ambulatory Pain Present Now: No Arrival Time: 08:34 Accompanied By: daughter, [name] Transfer Assistance: None Patient Identification Verified: Yes Secondary Verification Process Completed: Yes Patient Has Alerts: Yes Patient Alerts: Patient on Blood Thinner Aspirin 81mg  vascular ABI/TBI 2021 Electronic Signature(s) Signed: 07/03/2021 4:00:05 PM By: Gretta Cool, BSN, RN, CWS, Kim RN, BSN Entered By: Gretta Cool, BSN, RN, CWS, Kim on 07/02/2021 08:41:27 [name], [name] -------------------------------------------------------------------------------- Clinic Level of Care Assessment Details Patient Name: [name], [name] Date of Service: 07/02/2021 8:30 AM Medical Record Number: [id number] Patient Account Number: [id number] Date of Birth/Sex: Apr 30, 1927 (87 y.o. F) Treating RN: Cornell Barman Primary Care Nickolas Chalfin: Deborra Medina Other Clinician: Referring Ji Fairburn: Deborra Medina Treating Laquon Emel/Extender: Skipper Cliche in Treatment: 2 Clinic Level of Care Assessment Items TOOL 4 Quantity Score []  - Use when only an EandM is performed on FOLLOW-UP visit 0 ASSESSMENTS - Nursing Assessment / Reassessment X - Reassessment of Co-morbidities (includes updates in patient status) 1 10 X- 1 5 Reassessment of Adherence to Treatment Plan ASSESSMENTS - Wound and Skin Assessment / Reassessment X - Simple Wound Assessment / Reassessment - one wound 1 5 []  -  0 Complex Wound Assessment / Reassessment - multiple wounds []  - 0 Dermatologic / Skin Assessment (not related to wound area) ASSESSMENTS - Focused Assessment []  - Circumferential Edema Measurements - multi extremities 0 []  - 0 Nutritional Assessment / Counseling / Intervention []  - 0 Lower Extremity Assessment (monofilament, tuning fork, pulses) []  - 0 Peripheral Arterial Disease Assessment (using hand held doppler) ASSESSMENTS - Ostomy and/or Continence Assessment and Care []  - Incontinence Assessment and Management 0 []  - 0 Ostomy Care Assessment and Management (repouching, etc.) PROCESS - Coordination of Care X - Simple Patient / Family Education for ongoing care 1 15 []  - 0 Complex (extensive) Patient / Family Education for ongoing care []  - 0 Staff obtains Programmer, systems, Records, Test Results / Process Orders []  - 0 Staff telephones HHA, Nursing Homes / Clarify orders / etc []  - 0 Routine Transfer to another Facility (non-emergent condition) []  - 0 Routine Hospital Admission (non-emergent condition) []  - 0 New Admissions / Biomedical engineer / Ordering NPWT, Apligraf, etc. []  - 0 Emergency Hospital Admission (emergent condition) X- 1 10 Simple Discharge Coordination []  - 0 Complex (extensive) Discharge Coordination PROCESS - Special Needs []  - Pediatric / Minor Patient Management 0 []  - 0 Isolation Patient Management []  - 0 Hearing / Language / Visual special needs []  - 0 Assessment of Community assistance (transportation, D/C planning, etc.) []  - 0 Additional assistance / Altered mentation []  - 0 Support Surface(s) Assessment (bed, cushion, seat, etc.) INTERVENTIONS - Wound Cleansing / Measurement [name], [name] ([id number]) X- 1 5 Simple Wound Cleansing - one wound []  - 0 Complex Wound Cleansing - multiple wounds X- 1 5 Wound Imaging (photographs - any number of wounds) []  - 0 Wound Tracing (instead of photographs) X- 1 5 Simple Wound Measurement -  one wound []  - 0 Complex Wound Measurement - multiple wounds INTERVENTIONS - Wound Dressings []  - Small Wound Dressing one or  multiple wounds 0 []  - 0 Medium Wound Dressing one or multiple wounds []  - 0 Large Wound Dressing one or multiple wounds []  - 0 Application of Medications - topical []  - 0 Application of Medications - injection INTERVENTIONS - Miscellaneous []  - External ear exam 0 []  - 0 Specimen Collection (cultures, biopsies, blood, body fluids, etc.) []  - 0 Specimen(s) / Culture(s) sent or taken to Lab for analysis []  - 0 Patient Transfer (multiple staff / Civil Service fast streamer / Similar devices) []  - 0 Simple Staple / Suture removal (25 or less) []  - 0 Complex Staple / Suture removal (26 or more) []  - 0 Hypo / Hyperglycemic Management (close monitor of Blood Glucose) []  - 0 Ankle / Brachial Index (ABI) - do not check if billed separately X- 1 5 Vital Signs Has the patient been seen at the hospital within the last three years: Yes Total Score: 65 Level Of Care: New/Established - Level 2 Electronic Signature(s) Signed: 07/03/2021 4:00:05 PM By: Gretta Cool, BSN, RN, CWS, Kim RN, BSN Entered By: Gretta Cool, BSN, RN, CWS, Kim on 07/02/2021 09:12:49 [name], [name] -------------------------------------------------------------------------------- Encounter Discharge Information Details Patient Name: [name], [name] Date of Service: 07/02/2021 8:30 AM Medical Record Number: [id number] Patient Account Number: [id number] Date of Birth/Sex: 03-28-1927 (87 y.o. F) Treating RN: Cornell Barman Primary Care Kenric Ginger: Deborra Medina Other Clinician: Referring Lonza Shimabukuro: Deborra Medina Treating Ilithyia Titzer/Extender: Skipper Cliche in Treatment: 2 Encounter Discharge Information Items Discharge Condition: Stable Ambulatory Status: Ambulatory Discharge Destination: Home Transportation: Private Auto Accompanied By: daughter Schedule Follow-up Appointment: No Clinical Summary of  Care: Electronic Signature(s) Signed: 07/03/2021 4:00:05 PM By: Gretta Cool, BSN, RN, CWS, Kim RN, BSN Entered By: Gretta Cool, BSN, RN, CWS, Kim on 07/02/2021 09:14:02 Kilgore, Sindhu T. ([id number]) -------------------------------------------------------------------------------- Lower Extremity Assessment Details Patient Name: [name], [name] Date of Service: 07/02/2021 8:30 AM Medical Record Number: [id number] Patient Account Number: [id number] Date of Birth/Sex: Sep 18, 1927 (87 y.o. F) Treating RN: Cornell Barman Primary Care Brittne Kawasaki: Deborra Medina Other Clinician: Referring Zareah Hunzeker: Deborra Medina Treating Mohamed Portlock/Extender: Skipper Cliche in Treatment: 2 Edema Assessment Assessed: Shirlyn Goltz: No] [Right: Yes] Edema: [Left: N] [Right: o] Vascular Assessment Pulses: Dorsalis Pedis Palpable: [Right:Yes] Electronic Signature(s) Signed: 07/03/2021 4:00:05 PM By: Gretta Cool, BSN, RN, CWS, Kim RN, BSN Entered By: Gretta Cool, BSN, RN, CWS, Kim on 07/02/2021 08:49:54 Moeller, Jala T. ([id number]) -------------------------------------------------------------------------------- Multi Wound Chart Details Patient Name: [name], [name] Date of Service: 07/02/2021 8:30 AM Medical Record Number: [id number] Patient Account Number: [id number] Date of Birth/Sex: [birth year] (87 y.o. F) Treating RN: Cornell Barman Primary Care Tienna Bienkowski: Deborra Medina Other Clinician: Referring Hiro Vipond: Deborra Medina Treating Charmian Forbis/Extender: Skipper Cliche in Treatment: 2 Vital Signs Height(in): 60 Pulse(bpm): 63 Weight(lbs): 100 Blood Pressure(mmHg): 137/69 Body Mass Index(BMI): 20 Temperature(F): 97.8 Respiratory Rate(breaths/min): 16 Photos: [N/A:N/A] Wound Location: Right, Distal, Lateral Lower Leg N/A N/A Wounding Event: Laceration N/A N/A Primary Etiology: Venous Leg Ulcer N/A N/A Comorbid History: Cataracts, Chronic Obstructive N/A N/A Pulmonary Disease (COPD), Arrhythmia, Coronary Artery Disease, Hypertension,  Rheumatoid Arthritis, Osteoarthritis Date Acquired: 03/23/2021 N/A N/A Weeks of Treatment: 2 N/A N/A Wound Status: Healed - Epithelialized N/A N/A Measurements L x W x D (cm) 0x0x0 N/A N/A Area (cm) : 0 N/A N/A Volume (cm) : 0 N/A N/A % Reduction in Area: 100.00% N/A N/A % Reduction in Volume: 100.00% N/A N/A Classification: Full Thickness Without Exposed N/A N/A Support Structures Exudate Amount: None Present N/A N/A Wound Margin: Flat and Intact N/A N/A Granulation Amount: None Present (0%) N/A  N/A Necrotic Amount: None Present (0%) N/A N/A Exposed Structures: Fascia: No N/A N/A Fat Layer (Subcutaneous Tissue): No Tendon: No Muscle: No Joint: No Bone: No Epithelialization: Large (67-100%) N/A N/A Treatment Notes Electronic Signature(s) Signed: 07/03/2021 4:00:05 PM By: Gretta Cool, BSN, RN, CWS, Kim RN, BSN Entered By: Gretta Cool, BSN, RN, CWS, Kim on 07/02/2021 09:05:36 Varble, Neima T. ([id number]) -------------------------------------------------------------------------------- Multi-Disciplinary Care Plan Details Patient Name: [name], [name] Date of Service: 07/02/2021 8:30 AM Medical Record Number: [id number] Patient Account Number: [id number] Date of Birth/Sex: [birth year] (87 y.o. F) Treating RN: Cornell Barman Primary Care Jerika Wales: Deborra Medina Other Clinician: Referring Bracken Moffa: Deborra Medina Treating Florence Antonelli/Extender: Skipper Cliche in Treatment: 2 Active Inactive Electronic Signature(s) Signed: 07/03/2021 4:00:05 PM By: Gretta Cool, BSN, RN, CWS, Kim RN, BSN Entered By: Gretta Cool, BSN, RN, CWS, Kim on 07/02/2021 09:05:28 [name], [name] ([id number]) -------------------------------------------------------------------------------- Pain Assessment Details Patient Name: [name], [name] Date of Service: 07/02/2021 8:30 AM Medical Record Number: [id number] Patient Account Number: [id number] Date of Birth/Sex: 1927-01-08 (87 y.o. F) Treating RN: Cornell Barman Primary Care Saleen Peden:  Deborra Medina Other Clinician: Referring Olusegun Gerstenberger: Deborra Medina Treating Garrick Midgley/Extender: Skipper Cliche in Treatment: 2 Active Problems Location of Pain Severity and Description of Pain Patient Has Paino No Site Locations Pain Management and Medication Current Pain Management: Notes Patient denies pain at this time. Electronic Signature(s) Signed: 07/03/2021 4:00:05 PM By: Gretta Cool, BSN, RN, CWS, Kim RN, BSN Entered By: Gretta Cool, BSN, RN, CWS, Kim on 07/02/2021 08:44:08 [name], Brandy Jean ([id number]) -------------------------------------------------------------------------------- Patient/Caregiver Education Details Patient Name: [name], [name] Date of Service: 07/02/2021 8:30 AM Medical Record Number: [id number] Patient Account Number: [id number] Date of Birth/Gender: 08-14-1927 (87 y.o. F) Treating RN: Cornell Barman Primary Care Physician: Deborra Medina Other Clinician: Referring Physician: Deborra Medina Treating Physician/Extender: Skipper Cliche in Treatment: 2 Education Assessment Education Provided To: Patient Education Topics Provided Venous: Handouts: Controlling Swelling with Compression Stockings Methods: Demonstration, Explain/Verbal Responses: State content correctly Electronic Signature(s) Signed: 07/03/2021 4:00:05 PM By: Gretta Cool, BSN, RN, CWS, Kim RN, BSN Entered By: Gretta Cool, BSN, RN, CWS, Kim on 07/02/2021 09:13:10 [name], [name] ([id number]) -------------------------------------------------------------------------------- Wound Assessment Details Patient Name: [name], [name] Date of Service: 07/02/2021 8:30 AM Medical Record Number: [id number] Patient Account Number: [id number] Date of Birth/Sex: [birth year] (87 y.o. F) Treating RN: Cornell Barman Primary Care Raju Coppolino: Deborra Medina Other Clinician: Referring Rakeen Gaillard: Deborra Medina Treating Johann Gascoigne/Extender: Jeri Cos Weeks in Treatment: 2 Wound Status Wound Number: 1 Primary Venous Leg  Ulcer Etiology: Wound Location: Right, Distal, Lateral Lower Leg Wound Healed - Epithelialized Wounding Event: Laceration Status: Date Acquired: 03/23/2021 Comorbid Cataracts, Chronic Obstructive Pulmonary Disease (COPD), Weeks Of Treatment: 2 History: Arrhythmia, Coronary Artery Disease, Hypertension, Clustered Wound: No Rheumatoid Arthritis, Osteoarthritis Photos Wound Measurements Length: (cm) 0 Width: (cm) 0 Depth: (cm) 0 Area: (cm) 0 Volume: (cm) 0 % Reduction in Area: 100% % Reduction in Volume: 100% Epithelialization: Large (67-100%) Tunneling: No Undermining: No Wound Description Classification: Full Thickness Without Exposed Support Structure Wound Margin: Flat and Intact Exudate Amount: None Present s Foul Odor After Cleansing: No Slough/Fibrino No Wound Bed Granulation Amount: None Present (0%) Exposed Structure Necrotic Amount: None Present (0%) Fascia Exposed: No Fat Layer (Subcutaneous Tissue) Exposed: No Tendon Exposed: No Muscle Exposed: No Joint Exposed: No Bone Exposed: No Electronic Signature(s) Signed: 07/03/2021 4:00:05 PM By: Gretta Cool, BSN, RN, CWS, Kim RN, BSN Entered By: Gretta Cool, BSN, RN, CWS, Kim on 07/02/2021 09:05:12 Randon, [name] ([id number]) -------------------------------------------------------------------------------- Vitals Details Patient Name: Virani, Toba T. Date  of Service: 07/02/2021 8:30 AM Medical Record Number: [id number] Patient Account Number: [id number] Date of Birth/Sex: 06-Sep-1927 (87 y.o. F) Treating RN: Cornell Barman Primary Care Zuriyah Shatz: Deborra Medina Other Clinician: Referring Sumiko Ceasar: Deborra Medina Treating Reuben Knoblock/Extender: Skipper Cliche in Treatment: 2 Vital Signs Time Taken: 08:43 Temperature (F): 97.8 Height (in): 60 Pulse (bpm): 64 Weight (lbs): 100 Respiratory Rate (breaths/min): 16 Body Mass Index (BMI): 19.5 Blood Pressure (mmHg): 137/69 Reference Range: 80 - 120 mg / dl Electronic  Signature(s) Signed: 07/03/2021 4:00:05 PM By: Gretta Cool, BSN, RN, CWS, Kim RN, BSN Entered By: Gretta Cool, BSN, RN, CWS, Kim on 07/02/2021 08:43:53

## 2021-07-06 ENCOUNTER — Ambulatory Visit (INDEPENDENT_AMBULATORY_CARE_PROVIDER_SITE_OTHER): Payer: Medicare Other

## 2021-07-06 ENCOUNTER — Other Ambulatory Visit: Payer: Self-pay

## 2021-07-06 DIAGNOSIS — R079 Chest pain, unspecified: Secondary | ICD-10-CM | POA: Diagnosis not present

## 2021-07-06 DIAGNOSIS — Z8616 Personal history of COVID-19: Secondary | ICD-10-CM

## 2021-07-09 ENCOUNTER — Ambulatory Visit: Payer: Medicare Other | Admitting: Physician Assistant

## 2021-07-09 LAB — ECHOCARDIOGRAM COMPLETE
AR max vel: 1.95 cm2
AV Area VTI: 2.04 cm2
AV Area mean vel: 1.89 cm2
AV Mean grad: 3 mmHg
AV Peak grad: 5.9 mmHg
Ao pk vel: 1.21 m/s
Area-P 1/2: 2.24 cm2
MV VTI: 1.57 cm2
S' Lateral: 2.3 cm

## 2021-07-14 ENCOUNTER — Ambulatory Visit: Payer: Medicare Other | Admitting: Internal Medicine

## 2021-08-05 ENCOUNTER — Other Ambulatory Visit: Payer: Self-pay | Admitting: *Deleted

## 2021-08-05 MED ORDER — METOPROLOL SUCCINATE ER 25 MG PO TB24: 12.5000 mg | ORAL_TABLET | Freq: Every day | ORAL | 0 refills | Status: DC

## 2021-08-10 DIAGNOSIS — Z23 Encounter for immunization: Secondary | ICD-10-CM | POA: Diagnosis not present

## 2021-08-10 DIAGNOSIS — U071 COVID-19: Secondary | ICD-10-CM | POA: Diagnosis not present

## 2021-08-11 DIAGNOSIS — M19041 Primary osteoarthritis, right hand: Secondary | ICD-10-CM | POA: Diagnosis not present

## 2021-08-11 DIAGNOSIS — M19042 Primary osteoarthritis, left hand: Secondary | ICD-10-CM | POA: Diagnosis not present

## 2021-08-11 DIAGNOSIS — R768 Other specified abnormal immunological findings in serum: Secondary | ICD-10-CM | POA: Diagnosis not present

## 2021-08-11 DIAGNOSIS — I7300 Raynaud's syndrome without gangrene: Secondary | ICD-10-CM | POA: Diagnosis not present

## 2021-08-13 ENCOUNTER — Other Ambulatory Visit: Payer: Self-pay

## 2021-08-13 ENCOUNTER — Encounter: Payer: Self-pay | Admitting: Cardiology

## 2021-08-13 ENCOUNTER — Ambulatory Visit (INDEPENDENT_AMBULATORY_CARE_PROVIDER_SITE_OTHER): Payer: Medicare Other | Admitting: Cardiology

## 2021-08-13 VITALS — BP 118/62 | HR 58 | Ht 60.0 in | Wt 100.0 lb

## 2021-08-13 DIAGNOSIS — I493 Ventricular premature depolarization: Secondary | ICD-10-CM | POA: Diagnosis not present

## 2021-08-13 DIAGNOSIS — I10 Essential (primary) hypertension: Secondary | ICD-10-CM

## 2021-08-13 DIAGNOSIS — E7800 Pure hypercholesterolemia, unspecified: Secondary | ICD-10-CM

## 2021-08-13 NOTE — Progress Notes (Signed)
Cardiology Office Note:    Date:  08/13/2021   ID:  [name], DOB 12/22/1926, MRN [id number]  PCP:  Crecencio Mc, MD  [place] Cardiologist:  Kate Sable, MD  [facility] Electrophysiologist:  None   Referring MD: Crecencio Mc, MD   Chief Complaint  Patient presents with   Other    2 month follow up -- Meds reviewed verbally with patient.     History of Present Illness:    [name] is a 87 y.o. female with a hx of hypertension, CKD, who presents for follow-up.    Previously seen with symptoms of chest pain, underwent echo and Myoview which were both normal, no evidence for ischemia.  Last clinic visit was seen because of palpitations, cardiac monitor showed frequent PVCs.  Toprol-XL was increased to 25 mg daily, but patient states taking only 12.5 mg daily.  Repeat echo was obtained showing normal EF.  She denies chest pain, palpitations, shortness of breath.  Presents today with daughter.  Feels well.  Wondering if she can stop taking aspirin due to easy bruising.   Prior notes Echo 06/2021 EF 60 to 65%, impaired relaxation. Lexiscan Myoview 04/2020, no evidence for ischemia, low risk study.   Past Medical History:  Diagnosis Date   Allergy    Arthritis    Atherosclerosis    Atherosclerosis of abdominal aorta (HCC)    Chronic kidney disease    stage 2   CKD (chronic kidney disease) stage 2, GFR 60-89 ml/min    Collagen vascular disease ([place])    Diverticulosis 2008   GERD (gastroesophageal reflux disease)    Heart murmur    Hyperlipidemia    Lumbar spinal stenosis    Osteoporosis    Ovarian lump    RA (rheumatoid arthritis) ([place])    Raynaud's disease    Rheumatoid arthritis ([place])    Spinal stenosis     Past Surgical History:  Procedure Laterality Date   BREAST SURGERY Left 1965   Benign biopsy   CHOLECYSTECTOMY  2005   [place]   COLONOSCOPY  2008   Dr. Donnella Sham   EXCISION OF BREAST BIOPSY Left 1965   benign    EYE SURGERY Bilateral    Cataract Extraction with IOL   KYPHOPLASTY N/A 12/17/2016   Procedure: KYPHOPLASTY;  Surgeon: Hessie Knows, MD;  Location: [facility] ORS;  Service: Orthopedics;  Laterality: N/A;  l4   LUMBAR LAMINECTOMY  05/09/2017   L2-L5   VISCERAL ANGIOGRAPHY N/A 08/15/2017   Procedure: VISCERAL ANGIOGRAPHY;  Surgeon: Algernon Huxley, MD;  Location: [place] [facility];  Service: Cardiovascular;  Laterality: N/A;   VISCERAL ANGIOGRAPHY N/A 10/05/2017   Procedure: VISCERAL ANGIOGRAPHY;  Surgeon: Algernon Huxley, MD;  Location: [place] [facility];  Service: Cardiovascular;  Laterality: N/A;   VISCERAL ARTERY INTERVENTION N/A 08/15/2017   Procedure: VISCERAL ARTERY INTERVENTION;  Surgeon: Algernon Huxley, MD;  Location: [place] [facility];  Service: Cardiovascular;  Laterality: N/A;    Current Medications: Current Meds  Medication Sig   acetaminophen (TYLENOL) 500 MG tablet Take 500 mg by mouth every 6 (six) hours as needed for mild pain.   Alfalfa 500 MG TABS Take 500 mg by mouth daily.   Ascorbic Acid (VITAMIN C CR) 500 MG CPCR Take 500 mg by mouth daily.   Calcium Carbonate (CALCIUM 600 PO) Take by mouth.   Cholecalciferol (VITAMIN D3) 3000 UNITS TABS Take 3,000 Units by mouth 2 (two) times daily.  CINNAMON PO Take 1,000 mg by mouth daily.   clotrimazole-betamethasone (LOTRISONE) cream Apply topically 2 (two) times daily.   diclofenac sodium (VOLTAREN) 1 % GEL Apply topically as needed.    fluticasone (FLONASE) 50 MCG/ACT nasal spray Place 1 spray into both nostrils daily.   Ginkgo Biloba (GNP GINGKO BILOBA EXTRACT PO) Take 1 tablet by mouth daily.   Glucosamine Sulfate 500 MG CAPS Take 500 mg by mouth daily.   hydrALAZINE (APRESOLINE) 10 MG tablet Take 1 tablet (10 mg total) by mouth 3 (three) times daily. As needed for sbp  170 or higher   isosorbide mononitrate (IMDUR) 30 MG 24 hr tablet Take 0.5 tablets (15 mg total) by mouth daily.   Lactobacillus (PROBIOTIC ACIDOPHILUS PO) Take 1  capsule by mouth daily.    loratadine (CLARITIN) 10 MG tablet Take 10 mg by mouth daily as needed for allergies.   Magnesium 400 MG CAPS Take 400 mg by mouth daily.   metoprolol succinate (TOPROL XL) 25 MG 24 hr tablet Take 0.5 tablets (12.5 mg total) by mouth daily.   Multiple Vitamin (MULTIVITAMIN) capsule Take 1 capsule by mouth daily.   mupirocin ointment (BACTROBAN) 2 % Apply to right leg wound daily - a small dab as needed once a day.   nitroGLYCERIN (NITROSTAT) 0.4 MG SL tablet Place 1 tablet (0.4 mg total) under the tongue every 5 (five) minutes as needed for chest pain.   Omega-3 Fatty Acids (OMEGA 3 PO) Take 520 mg by mouth daily.    Pramoxine-HC (HYDROCORTISONE ACE-PRAMOXINE) 2.5-1 % CREA Apply topically as needed.    Turmeric 450 MG CAPS Take 450 Units by mouth daily.    vitamin B-12 (CYANOCOBALAMIN) 1000 MCG tablet Take 1,000 mcg by mouth daily.   [DISCONTINUED] aspirin EC 81 MG EC tablet Take 1 tablet (81 mg total) by mouth every evening.     Allergies:   Ibandronic acid, Other, Prednisone, Actonel [risedronate], Amoxicillin, Pantoprazole, Raloxifene, Versed [midazolam], and Doxycycline   Social History   Socioeconomic History   Marital status: Widowed    Spouse name: Gwyndolyn Saxon   Number of children: 3   Years of education: some college   Highest education level: 12th grade  Occupational History    Employer: RETIRED  Tobacco Use   Smoking status: Never   Smokeless tobacco: Never   Tobacco comments:    smoking cessation materials not required  Vaping Use   Vaping Use: Never used  Substance and Sexual Activity   Alcohol use: No    Alcohol/week: 0.0 standard drinks   Drug use: No   Sexual activity: Not Currently  Other Topics Concern   Not on file  Social History Narrative   Not on file   Social Determinants of Health   Financial Resource Strain: Low Risk    Difficulty of Paying Living Expenses: Not hard at all  Food Insecurity: No Food Insecurity   Worried  About Charity fundraiser in the Last Year: Never true   Ran Out of Food in the Last Year: Never true  Transportation Needs: No Transportation Needs   Lack of Transportation (Medical): No   Lack of Transportation (Non-Medical): No  Physical Activity: Sufficiently Active   Days of Exercise per Week: 7 days   Minutes of Exercise per Session: 30 min  Stress: No Stress Concern Present   Feeling of Stress : Not at all  Social Connections: Moderately Integrated   Frequency of Communication with Friends and Family: More than three times a  week   Frequency of Social Gatherings with Friends and Family: Three times a week   Attends Religious Services: More than 4 times per year   Active Member of Clubs or Organizations: Yes   Attends Archivist Meetings: More than 4 times per year   Marital Status: Widowed     Family History: The patient's family history includes Alcohol abuse in her sister; Cancer in her brother and brother; Diabetes in her brother, maternal grandmother, and sister; Heart disease in her maternal grandmother and mother; Heart disease (age of onset: 35) in her brother; Hypertension in her father; Kidney disease in her sister; Stroke in her brother.  ROS:   Please see the history of present illness.     All other systems reviewed and are negative.  EKGs/Labs/Other Studies Reviewed:    The following studies were reviewed today:   EKG:  EKG is  ordered today.  The ekg ordered today demonstrates sinus bradycardia.  Recent Labs: 04/01/2021: ALT 18 06/12/2021: BUN 14; Creatinine, Ser 0.54; Hemoglobin 13.9; Magnesium 1.9; Platelets 192; Potassium 4.6; Sodium 137; TSH 1.433  Recent Lipid Panel    Component Value Date/Time   CHOL 137 04/03/2020 1003   CHOL 205 (H) 12/19/2015 1422   TRIG 95.0 04/03/2020 1003   HDL 62.80 04/03/2020 1003   HDL 84 12/19/2015 1422   CHOLHDL 2 04/03/2020 1003   VLDL 19.0 04/03/2020 1003   LDLCALC 55 04/03/2020 1003   LDLCALC 63 01/01/2019  1238    Physical Exam:    VS:  BP 118/62 (BP Location: Left Arm, Patient Position: Sitting, Cuff Size: Normal)   Pulse (!) 58   Ht 5' (1.524 m)   Wt 100 lb (45.4 kg)   SpO2 99%   BMI 19.53 kg/m     Wt Readings from Last 3 Encounters:  08/13/21 100 lb (45.4 kg)  06/17/21 100 lb 4 oz (45.5 kg)  06/12/21 100 lb 8 oz (45.6 kg)     GEN:  Well nourished, well developed in no acute distress HEENT: Normal NECK: No JVD; No carotid bruits LYMPHATICS: No lymphadenopathy CARDIAC: RRR, no murmurs, rubs, gallops RESPIRATORY:  Clear to auscultation without rales, wheezing or rhonchi  ABDOMEN: Soft, non-tender, non-distended MUSCULOSKELETAL:  No edema; No deformity  SKIN: Warm and dry NEUROLOGIC:  Alert and oriented x 3 PSYCHIATRIC:  Normal affect   ASSESSMENT:    1. PVC (premature ventricular contraction)   2. Primary hypertension   3. Pure hypercholesterolemia     PLAN:    In order of problems listed above:  Frequent PVCs, echo with preserved EF.  Cardiac monitor 12.2% PVC burden.  Continue Toprol-XL 12.5 mg daily.  Symptoms of palpitations improved. Hypertension, BP controlled.  Toprol-XL, Imdur. History of hyperlipidemia, cholesterol controlled.  Due to age, okay to stop aspirin, statin.  No clear benefits.  Follow-up yearly.   Medication Adjustments/Labs and Tests Ordered: Current medicines are reviewed at length with the patient today.  Concerns regarding medicines are outlined above.  Orders Placed This Encounter  Procedures   EKG 12-Lead    No orders of the defined types were placed in this encounter.   Patient Instructions  Medication Instructions:   Your physician has recommended you make the following change in your medication:    STOP taking your Aspirin   *If you need a refill on your cardiac medications before your next appointment, please call your pharmacy*   Lab Work: None ordered If you have labs (blood work) drawn today  and your tests are  completely normal, you will receive your results only by: MyChart Message (if you have MyChart) OR A paper copy in the mail If you have any lab test that is abnormal or we need to change your treatment, we will call you to review the results.   Testing/Procedures: None ordered   Follow-Up: At [facility], you and your health needs are our priority.  As part of our continuing mission to provide you with exceptional heart care, we have created designated Provider Care Teams.  These Care Teams include your primary Cardiologist (physician) and Advanced Practice Providers (APPs -  Physician Assistants and Nurse Practitioners) who all work together to provide you with the care you need, when you need it.  We recommend signing up for the patient portal called "MyChart".  Sign up information is provided on this After Visit Summary.  MyChart is used to connect with patients for Virtual Visits (Telemedicine).  Patients are able to view lab/test results, encounter notes, upcoming appointments, etc.  Non-urgent messages can be sent to your provider as well.   To learn more about what you can do with MyChart, go to [email or web address].    Your next appointment:   1 year(s)  The format for your next appointment:   In Person  Provider:   You may see Kate Sable, MD or one of the following Advanced Practice Providers on your designated Care Team:   Murray Hodgkins, NP Christell Faith, PA-C Marrianne Mood, PA-C Cadence [place], [place]   Other Instructions    Signed, Kate Sable, MD  08/13/2021 12:47 PM    [place]

## 2021-08-13 NOTE — Patient Instructions (Signed)
Medication Instructions:   Your physician has recommended you make the following change in your medication:    STOP taking your Aspirin   *If you need a refill on your cardiac medications before your next appointment, please call your pharmacy*   Lab Work: None ordered If you have labs (blood work) drawn today and your tests are completely normal, you will receive your results only by: [place] (if you have MyChart) OR A paper copy in the mail If you have any lab test that is abnormal or we need to change your treatment, we will call you to review the results.   Testing/Procedures: None ordered   Follow-Up: At [facility], you and your health needs are our priority.  As part of our continuing mission to provide you with exceptional heart care, we have created designated Provider Care Teams.  These Care Teams include your primary Cardiologist (physician) and Advanced Practice Providers (APPs -  Physician Assistants and Nurse Practitioners) who all work together to provide you with the care you need, when you need it.  We recommend signing up for the patient portal called "MyChart".  Sign up information is provided on this After Visit Summary.  MyChart is used to connect with patients for Virtual Visits (Telemedicine).  Patients are able to view lab/test results, encounter notes, upcoming appointments, etc.  Non-urgent messages can be sent to your provider as well.   To learn more about what you can do with MyChart, go to [email or web address].    Your next appointment:   1 year(s)  The format for your next appointment:   In Person  Provider:   You may see Kate Sable, MD or one of the following Advanced Practice Providers on your designated Care Team:   Murray Hodgkins, NP Christell Faith, PA-C Marrianne Mood, PA-C Cadence [place], [place]   Other Instructions

## 2021-09-08 ENCOUNTER — Encounter: Payer: Self-pay | Admitting: Gastroenterology

## 2021-09-08 ENCOUNTER — Other Ambulatory Visit: Payer: Self-pay

## 2021-09-08 ENCOUNTER — Ambulatory Visit (INDEPENDENT_AMBULATORY_CARE_PROVIDER_SITE_OTHER): Payer: Medicare Other | Admitting: Gastroenterology

## 2021-09-08 VITALS — BP 144/80 | HR 60 | Temp 97.4°F | Wt 101.6 lb

## 2021-09-08 DIAGNOSIS — R1011 Right upper quadrant pain: Secondary | ICD-10-CM | POA: Diagnosis not present

## 2021-09-08 DIAGNOSIS — R197 Diarrhea, unspecified: Secondary | ICD-10-CM | POA: Diagnosis not present

## 2021-09-08 NOTE — Progress Notes (Signed)
[name], MD [address]  [place]  [place],  [address]  Main: [phone]  Fax: [phone]   Primary Care Physician: Crecencio Mc, MD   Chief Complaint  Patient presents with   Follow-up    3 month   Abdominal Pain    Pt reports RUQ intermittent abd pain under ribs, and tightness denies N/V did have diarrhea x 3 weeks ago    HPI: [name] is a 87 y.o. female here for follow-up.  Reports feeling better compared to last visit.  Denies heartburn.  Does report intermittent right upper quadrant pain, that is reproducible on palpation.  No nausea or vomiting.  unrelated to meals.  Most recent CT abdomen pelvis showed history of cholecystectomy, no biliary duct dilatation.  No dysphagia.  Is not on any PPI or H2 RA therapy  As described having loose stools recently.  No blood in stool.   ROS: All ROS reviewed and negative except as per HPI   Past Medical History:  Diagnosis Date   Allergy    Arthritis    Atherosclerosis    Atherosclerosis of abdominal aorta (HCC)    Chronic kidney disease    stage 2   CKD (chronic kidney disease) stage 2, GFR 60-89 ml/min    Collagen vascular disease ([place])    Diverticulosis 2008   GERD (gastroesophageal reflux disease)    Heart murmur    Hyperlipidemia    Lumbar spinal stenosis    Osteoporosis    Ovarian lump    RA (rheumatoid arthritis) ([place])    Raynaud's disease    Rheumatoid arthritis ([place])    Spinal stenosis     Past Surgical History:  Procedure Laterality Date   BREAST SURGERY Left 1965   Benign biopsy   CHOLECYSTECTOMY  2005   [place]   COLONOSCOPY  2008   Dr. Donnella Sham   EXCISION OF BREAST BIOPSY Left 1965   benign   EYE SURGERY Bilateral    Cataract Extraction with IOL   KYPHOPLASTY N/A 12/17/2016   Procedure: KYPHOPLASTY;  Surgeon: Hessie Knows, MD;  Location: [facility] ORS;  Service: Orthopedics;  Laterality: N/A;  l4   LUMBAR LAMINECTOMY  05/09/2017   L2-L5   VISCERAL  ANGIOGRAPHY N/A 08/15/2017   Procedure: VISCERAL ANGIOGRAPHY;  Surgeon: Algernon Huxley, MD;  Location: [place] [facility];  Service: Cardiovascular;  Laterality: N/A;   VISCERAL ANGIOGRAPHY N/A 10/05/2017   Procedure: VISCERAL ANGIOGRAPHY;  Surgeon: Algernon Huxley, MD;  Location: [place] [facility];  Service: Cardiovascular;  Laterality: N/A;   VISCERAL ARTERY INTERVENTION N/A 08/15/2017   Procedure: VISCERAL ARTERY INTERVENTION;  Surgeon: Algernon Huxley, MD;  Location: [place] [facility];  Service: Cardiovascular;  Laterality: N/A;    Prior to Admission medications   Medication Sig Start Date End Date Taking? Authorizing Provider  acetaminophen (TYLENOL) 500 MG tablet Take 500 mg by mouth every 6 (six) hours as needed for mild pain.   Yes [provider]  Alfalfa 500 MG TABS Take 500 mg by mouth daily.   Yes [provider]  Ascorbic Acid (VITAMIN C CR) 500 MG CPCR Take 500 mg by mouth daily.   Yes [provider]  Calcium Carbonate (CALCIUM 600 PO) Take by mouth.   Yes [provider]  Cholecalciferol (VITAMIN D3) 3000 UNITS TABS Take 3,000 Units by mouth 2 (two) times daily.    Yes [provider]  CINNAMON PO Take 1,000 mg by mouth  daily.   Yes [provider]  clotrimazole-betamethasone (LOTRISONE) cream Apply topically 2 (two) times daily. 03/14/19  Yes Poulose, Bethel Born, NP  diclofenac sodium (VOLTAREN) 1 % GEL Apply topically as needed.  08/02/19  Yes [provider]  fluticasone (FLONASE) 50 MCG/ACT nasal spray Place 1 spray into both nostrils daily.   Yes [provider]  Ginkgo Biloba (GNP GINGKO BILOBA EXTRACT PO) Take 1 tablet by mouth daily.   Yes [provider]  Glucosamine Sulfate 500 MG CAPS Take 500 mg by mouth daily.   Yes [provider]  hydrALAZINE (APRESOLINE) 10 MG tablet Take 1 tablet (10 mg total) by mouth 3 (three) times daily. As needed for sbp  170 or higher 09/16/20  Yes Crecencio Mc, MD  isosorbide mononitrate (IMDUR) 30 MG 24 hr tablet Take 0.5 tablets (15 mg total) by mouth daily. 05/07/21  Yes Agbor-Etang, Aaron Edelman, MD  Lactobacillus (PROBIOTIC ACIDOPHILUS PO) Take 1 capsule by mouth daily.    Yes [provider]  loratadine (CLARITIN) 10 MG tablet Take 10 mg by mouth daily as needed for allergies.   Yes [provider]  Magnesium 400 MG CAPS Take 400 mg by mouth daily.   Yes [provider]  metoprolol succinate (TOPROL XL) 25 MG 24 hr tablet Take 0.5 tablets (12.5 mg total) by mouth daily. 08/05/21 08/05/22 Yes Agbor-Etang, Aaron Edelman, MD  Multiple Vitamin (MULTIVITAMIN) capsule Take 1 capsule by mouth daily.   Yes [provider]  mupirocin ointment (BACTROBAN) 2 % Apply to right leg wound daily - a small dab as needed once a day. 08/26/20  Yes Marval Regal, NP  nitroGLYCERIN (NITROSTAT) 0.4 MG SL tablet Place 1 tablet (0.4 mg total) under the tongue every 5 (five) minutes as needed for chest pain. 04/22/20  Yes Crecencio Mc, MD  Omega-3 Fatty Acids (OMEGA 3 PO) Take 520 mg by mouth daily.    Yes [provider]  Pramoxine-HC (HYDROCORTISONE ACE-PRAMOXINE) 2.5-1 % CREA Apply topically as needed.    Yes [provider]  Turmeric 450 MG CAPS Take 450 Units by mouth daily.    Yes [provider]  vitamin B-12 (CYANOCOBALAMIN) 1000 MCG tablet Take 1,000 mcg by mouth daily.   Yes [provider]    Family History  Problem Relation Age of Onset   Heart disease Mother    Hypertension Father    Heart disease Brother 106       heart attack   Cancer Brother        bladder   Diabetes Sister    Alcohol abuse Sister    Kidney disease Sister    Diabetes Brother    Stroke Brother    Diabetes Maternal Grandmother    Heart disease Maternal Grandmother    Cancer Brother        lung     Social History   Tobacco Use   Smoking status: Never   Smokeless tobacco: Never   Tobacco comments:     smoking cessation materials not required  Vaping Use   Vaping Use: Never used  Substance Use Topics   Alcohol use: No    Alcohol/week: 0.0 standard drinks   Drug use: No    Allergies as of 09/08/2021 - Review Complete 09/08/2021  Allergen Reaction Noted   Ibandronic acid Other (See Comments) 08/14/2015   Other Other (See Comments) 01/19/2012   Prednisone Rash 12/26/2014   Actonel [risedronate] Other (See Comments) 06/19/2014  Amoxicillin Hives 04/15/2020   Pantoprazole Other (See Comments) 08/11/2020   Raloxifene Other (See Comments) 08/14/2015   Versed [midazolam] Other (See Comments) 12/16/2016   Doxycycline Rash 12/26/2014    Physical Examination:  Constitutional: General:   Alert,  Well-developed, well-nourished, pleasant and cooperative in NAD BP (!) 144/80   Pulse 60   Temp (!) 97.4 F (36.3 C) (Oral)   Wt 101 lb 9.6 oz (46.1 kg)   BMI 19.84 kg/m   Respiratory: Normal respiratory effort  Gastrointestinal:  Soft, mild pain on superficial palpation under right ribs, and non-distended without masses, hepatosplenomegaly or hernias noted.  No guarding or rebound tenderness.     Cardiac: No clubbing or edema.  No cyanosis. Normal posterior tibial pedal pulses noted.  Psych:  Alert and cooperative. Normal mood and affect.  Musculoskeletal:  Normal gait. Head normocephalic, atraumatic. Symmetrical without gross deformities. 5/5 Lower extremity strength bilaterally.  Skin: Warm. Intact without significant lesions or rashes. No jaundice.  Neck: Supple, trachea midline  Lymph: No cervical lymphadenopathy  Psych:  Alert and oriented x3, Alert and cooperative. Normal mood and affect.  Labs: CMP     Component Value Date/Time   NA 137 06/12/2021 1028   NA 143 12/19/2015 1422   K 4.6 06/12/2021 1028   CL 102 06/12/2021 1028   CO2 29 06/12/2021 1028   GLUCOSE 92 06/12/2021 1028   BUN 14 06/12/2021 1028   BUN 15 12/19/2015 1422   CREATININE 0.54 06/12/2021 1028    CREATININE 0.62 01/26/2019 1542   CALCIUM 9.1 06/12/2021 1028   PROT 6.6 04/01/2021 1528   PROT 6.2 12/19/2015 1422   ALBUMIN 4.3 04/01/2021 1528   ALBUMIN 4.0 12/19/2015 1422   AST 32 04/01/2021 1528   ALT 18 04/01/2021 1528   ALKPHOS 49 04/01/2021 1528   BILITOT 0.4 04/01/2021 1528   BILITOT <0.2 12/19/2015 1422   GFRNONAA >60 06/12/2021 1028   GFRNONAA 80 01/26/2019 1542   GFRAA >60 05/22/2020 1536   GFRAA 93 01/26/2019 1542   Lab Results  Component Value Date   WBC 5.4 06/12/2021   HGB 13.9 06/12/2021   HCT 43.0 06/12/2021   MCV 91.1 06/12/2021   PLT 192 06/12/2021    Imaging Studies:   Assessment and Plan:   Melayna Robarts is a 87 y.o. y/o female here for follow-up and is reporting reproducible right upper quadrant pain, with no nausea or vomiting, relation to meals.  Good appetite.  No heartburn.  Pain consistent with likely musculoskeletal etiology Ice pack to 3 times a day recommended until pain improves Pain does not improve in 2 weeks, patient advised to call us We will also obtain hepatic function panel Patient has previous history of cholecystectomy  Since patient is reporting loose stools and has reported that before as well, I will check fecal elastase as that has not been done before  In the meantime, patient can use Metamucil to help bulk stool and she verbalized understanding of this.  Dr [name]

## 2021-09-09 ENCOUNTER — Other Ambulatory Visit: Payer: Self-pay | Admitting: Gastroenterology

## 2021-09-09 DIAGNOSIS — R197 Diarrhea, unspecified: Secondary | ICD-10-CM | POA: Diagnosis not present

## 2021-09-09 DIAGNOSIS — R1011 Right upper quadrant pain: Secondary | ICD-10-CM | POA: Diagnosis not present

## 2021-09-09 LAB — HEPATIC FUNCTION PANEL
ALT: 17 IU/L (ref 0–32)
AST: 32 IU/L (ref 0–40)
Albumin: 4.4 g/dL (ref 3.5–4.6)
Alkaline Phosphatase: 60 IU/L (ref 44–121)
Bilirubin Total: 0.2 mg/dL (ref 0.0–1.2)
Bilirubin, Direct: <0.1 mg/dL (ref 0.00–0.40)
Total Protein: 6.9 g/dL (ref 6.0–8.5)

## 2021-09-11 DIAGNOSIS — Z20828 Contact with and (suspected) exposure to other viral communicable diseases: Secondary | ICD-10-CM | POA: Diagnosis not present

## 2021-09-13 LAB — PANCREATIC ELASTASE, FECAL: Pancreatic Elastase, Fecal: 456 ug Elast./g (ref >200)

## 2021-10-19 DIAGNOSIS — H16223 Keratoconjunctivitis sicca, not specified as Sjogren's, bilateral: Secondary | ICD-10-CM | POA: Diagnosis not present

## 2021-10-28 ENCOUNTER — Ambulatory Visit
Admission: RE | Admit: 2021-10-28 | Discharge: 2021-10-28 | Disposition: A | Discharge status: Home / Self Care | Payer: Medicare Other | Source: Ambulatory Visit | Attending: Internal Medicine | Admitting: Internal Medicine

## 2021-10-28 ENCOUNTER — Other Ambulatory Visit: Payer: Self-pay | Admitting: Internal Medicine

## 2021-10-28 ENCOUNTER — Telehealth: Payer: Self-pay | Admitting: Internal Medicine

## 2021-10-28 ENCOUNTER — Ambulatory Visit
Admission: RE | Admit: 2021-10-28 | Discharge: 2021-10-28 | Disposition: A | Discharge status: Home / Self Care | Payer: Medicare Other | Attending: Internal Medicine | Admitting: Internal Medicine

## 2021-10-28 DIAGNOSIS — R052 Subacute cough: Secondary | ICD-10-CM

## 2021-10-28 DIAGNOSIS — S2231XA Fracture of one rib, right side, initial encounter for closed fracture: Secondary | ICD-10-CM | POA: Diagnosis not present

## 2021-10-28 MED ORDER — LEVOFLOXACIN 500 MG PO TABS: 500.0000 mg | ORAL_TABLET | Freq: Every day | ORAL | 0 refills | Status: AC

## 2021-10-28 NOTE — Telephone Encounter (Signed)
I called and spoke with patient. She will have daughter pick up from pharmacy.

## 2021-10-28 NOTE — Telephone Encounter (Signed)
Patient called and wanted Dr Derrel Nip to know she is starting to feel better. She wanted to know if she should use the hot or cold vaporizer. She is a little concerned today, she said she has a lot of chest congestion, not fever. She thought the vaporizer would help.

## 2021-10-28 NOTE — Telephone Encounter (Signed)
Spoke with pt and she stated that she never tested for covid. Pt is aware of the antibiotic that will be sent in. Pt stated that she has already had the xray done this morning. What dose of Levaquin do you want her to take and I can get it sent in for you?

## 2021-10-28 NOTE — Telephone Encounter (Signed)
Please find out if she is having   Fever (when was her last temp > 100.4)  Productive cough Shortness of breath at rest or with exertion  Chest x ray ordered : do at [facility] today

## 2021-10-28 NOTE — Telephone Encounter (Signed)
Spoke with pt and she stated that she has never had a fever of 100.4 or higher. The cough has become productive and the phlegm is blood dark green to light green with blood streaks in them. She stated that she does get SOBr with exertion but she has not been doing much so she doesn't get SOBr often. Pt is going to [facility] to have xray done today.

## 2021-11-16 ENCOUNTER — Encounter: Payer: Self-pay | Admitting: Internal Medicine

## 2021-11-16 ENCOUNTER — Other Ambulatory Visit: Payer: Self-pay

## 2021-11-16 ENCOUNTER — Ambulatory Visit (INDEPENDENT_AMBULATORY_CARE_PROVIDER_SITE_OTHER): Payer: Medicare Other | Admitting: Internal Medicine

## 2021-11-16 VITALS — BP 160/74 | HR 64 | Temp 97.6°F | Ht 60.0 in | Wt 101.2 lb

## 2021-11-16 DIAGNOSIS — E782 Mixed hyperlipidemia: Secondary | ICD-10-CM | POA: Diagnosis not present

## 2021-11-16 DIAGNOSIS — F418 Other specified anxiety disorders: Secondary | ICD-10-CM | POA: Diagnosis not present

## 2021-11-16 DIAGNOSIS — E559 Vitamin D deficiency, unspecified: Secondary | ICD-10-CM | POA: Diagnosis not present

## 2021-11-16 DIAGNOSIS — I10 Essential (primary) hypertension: Secondary | ICD-10-CM

## 2021-11-16 DIAGNOSIS — I700 Atherosclerosis of aorta: Secondary | ICD-10-CM

## 2021-11-16 DIAGNOSIS — M810 Age-related osteoporosis without current pathological fracture: Secondary | ICD-10-CM

## 2021-11-16 DIAGNOSIS — R4589 Other symptoms and signs involving emotional state: Secondary | ICD-10-CM

## 2021-11-16 DIAGNOSIS — R739 Hyperglycemia, unspecified: Secondary | ICD-10-CM | POA: Diagnosis not present

## 2021-11-16 DIAGNOSIS — Z79899 Other long term (current) drug therapy: Secondary | ICD-10-CM

## 2021-11-16 LAB — CBC WITH DIFFERENTIAL/PLATELET
Basophils Absolute: 0.1 10*3/uL (ref 0.0–0.1)
Basophils Relative: 1.3 % (ref 0.0–3.0)
Eosinophils Absolute: 0.1 10*3/uL (ref 0.0–0.7)
Eosinophils Relative: 1.5 % (ref 0.0–5.0)
HCT: 41.2 % (ref 36.0–46.0)
Hemoglobin: 13.3 g/dL (ref 12.0–15.0)
Lymphocytes Relative: 27.1 % (ref 12.0–46.0)
Lymphs Abs: 1.1 10*3/uL (ref 0.7–4.0)
MCHC: 32.4 g/dL (ref 30.0–36.0)
MCV: 91 fl (ref 78.0–100.0)
Monocytes Absolute: 0.5 10*3/uL (ref 0.1–1.0)
Monocytes Relative: 13.1 % — ABNORMAL HIGH (ref 3.0–12.0)
Neutro Abs: 2.3 10*3/uL (ref 1.4–7.7)
Neutrophils Relative %: 57 % (ref 43.0–77.0)
Platelets: 200 10*3/uL (ref 150.0–400.0)
RBC: 4.53 Mil/uL (ref 3.87–5.11)
RDW: 13.2 % (ref 11.5–15.5)
WBC: 4 10*3/uL (ref 4.0–10.5)

## 2021-11-16 LAB — MICROALBUMIN / CREATININE URINE RATIO
Creatinine,U: 25.7 mg/dL
Microalb Creat Ratio: 2.7 mg/g (ref 0.0–30.0)
Microalb, Ur: <0.7 mg/dL (ref 0.0–1.9)

## 2021-11-16 LAB — COMPREHENSIVE METABOLIC PANEL
ALT: 17 U/L (ref 0–35)
AST: 32 U/L (ref 0–37)
Albumin: 3.8 g/dL (ref 3.5–5.2)
Alkaline Phosphatase: 46 U/L (ref 39–117)
BUN: 13 mg/dL (ref 6–23)
CO2: 30 mEq/L (ref 19–32)
Calcium: 9 mg/dL (ref 8.4–10.5)
Chloride: 103 mEq/L (ref 96–112)
Creatinine, Ser: 0.61 mg/dL (ref 0.40–1.20)
GFR: 77.8 mL/min (ref >60.00)
Glucose, Bld: 74 mg/dL (ref 70–99)
Potassium: 4.2 mEq/L (ref 3.5–5.1)
Sodium: 139 mEq/L (ref 135–145)
Total Bilirubin: 0.4 mg/dL (ref 0.2–1.2)
Total Protein: 6.3 g/dL (ref 6.0–8.3)

## 2021-11-16 LAB — LIPID PANEL
Cholesterol: 204 mg/dL — ABNORMAL HIGH (ref 0–200)
HDL: 68.1 mg/dL (ref >39.00)
LDL Cholesterol: 110 mg/dL — ABNORMAL HIGH (ref 0–99)
NonHDL: 135.76
Total CHOL/HDL Ratio: 3
Triglycerides: 127 mg/dL (ref 0.0–149.0)
VLDL: 25.4 mg/dL (ref 0.0–40.0)

## 2021-11-16 LAB — VITAMIN D 25 HYDROXY (VIT D DEFICIENCY, FRACTURES): VITD: 33.43 ng/mL (ref 30.00–100.00)

## 2021-11-16 LAB — HEMOGLOBIN A1C: Hgb A1c MFr Bld: 7 % — ABNORMAL HIGH (ref 4.6–6.5)

## 2021-11-16 MED ORDER — ISOSORBIDE MONONITRATE ER 30 MG PO TB24: 30.0000 mg | ORAL_TABLET | Freq: Every day | ORAL | 1 refills | Status: DC

## 2021-11-16 MED ORDER — ZOSTER VAC RECOMB ADJUVANTED 50 MCG/0.5ML IM SUSR: 0.5000 mL | Freq: Once | INTRAMUSCULAR | 1 refills | Status: AC

## 2021-11-16 NOTE — Patient Instructions (Addendum)
Increase isosorbide mononitrate to a full tablet  at night  Continue your other medications  at current doses.    You can  take up to 2000 mg of acetominophen (tylenol) every day safely  In divided doses (500 mg every 6 hours  Or 1000 mg every 12 hours.)      The Shingrix vaccine series  will be COVERED BY MEDICARE if you get them at your pharmacy .  Please note that the vaccine does cause flu like symptoms for up to 48 hours,  so plan accordingly

## 2021-11-16 NOTE — Assessment & Plan Note (Signed)
Untreated due to fear of side effects of drugs,incluidng prolia .  Taking vitamin D and calciun

## 2021-11-16 NOTE — Assessment & Plan Note (Signed)
Currently untreated due to age,  But willing to resume statin for LDL >100

## 2021-11-16 NOTE — Assessment & Plan Note (Signed)
Currently untreated based on cardiology's rec's. 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

## 2021-11-16 NOTE — Assessment & Plan Note (Signed)
She is not afraid of death and dying.  She is afraid of becoming a burden  to her daughters.

## 2021-11-16 NOTE — Assessment & Plan Note (Signed)
Increasing imdur dose to 30 mg.  encouarged to avoid over management of blood  pressure

## 2021-11-16 NOTE — Progress Notes (Signed)
Subjective:  Patient ID: [name], female    DOB: 1927/10/19  Age: 88 y.o. MRN: [id number]  CC: The primary encounter diagnosis was Mixed hyperlipidemia. Diagnoses of Hyperglycemia, Long-term use of high-risk medication, Accelerated hypertension, Osteoporosis, postmenopausal, Vitamin D deficiency, Atherosclerosis of abdominal aorta ([name]), and Anxiety about health were also pertinent to this visit.   This visit occurred during the SARS-CoV-2 public health emergency.  Safety protocols were in place, including screening questions prior to the visit, additional usage of staff PPE, and extensive cleaning of exam room while observing appropriate contact time as indicated for disinfecting solutions.    HPI [name] presents for follow up on hypertension, aortic atheroscleoris , GERD, and other chronic issues . She is accompanied by her dughter [name] Chief Complaint  Patient presents with   Follow-up    6 month follow up    1) COVID infection in July,  symptoms resolved.  Had another nonspecific infection in December.  Chest x ray done Dec 21 for persistent cough, reviewed today "The lungs are hyperinflated. Mild, diffuse, chronic appearing increased lung markings are seen. There is no evidence of acute  infiltrate, pleural effusion or pneumothorax. The heart size and mediastinal contours are within normal limits. Chronic third, sixth,  seventh and eighth right rib fractures are seen. " She is asymptomatic between infections and has no history of asthma or COPD.    2) Seen by GI I n  Nov for persistent RUQ pain and loose stool follow up.  S/pCHOLE,  CT was NORMAL.  EXAM SUGGESTED   [facility] ORIGIN  FOR PAIN  3) Saw Cardiology in October .  No ischemia or systolic dysfunction,  just PVC;'s addressed with continued USE OF toprol 12.5 mg daily   4) HTN ;  last 2 readings in office have been elevated. For the last several months , since thanksgiving has been labile at home. Checks  daily at home.  Has frequently seen 893 to 810  systolic readings and up to 175 diastolic .  Taking 15 mg Imdur,  12.5 mg toprol and hydralazine prn sbp > 170/   5) Dry eye:  ophthalmology treated in late December with trial of xiidra . Started having occipital headache  and upper neck feels tight .since starting use of topical drops.     6)  Stools improving  .  Taking probiotics   Cc: still having RUQ pain   Not taking atorvastatin and asa due to age per cardiology's recs.  No longer having reflux with out PPI        Outpatient Medications Prior to Visit  Medication Sig Dispense Refill   acetaminophen (TYLENOL) 500 MG tablet Take 500 mg by mouth every 6 (six) hours as needed for mild pain.     Alfalfa 500 MG TABS Take 500 mg by mouth daily.     Ascorbic Acid (VITAMIN C CR) 500 MG CPCR Take 500 mg by mouth daily.     Calcium Carbonate (CALCIUM 600 PO) Take by mouth.     Cholecalciferol (VITAMIN D3) 3000 UNITS TABS Take 3,000 Units by mouth 2 (two) times daily.      CINNAMON PO Take 1,000 mg by mouth daily.     clotrimazole-betamethasone (LOTRISONE) cream Apply topically 2 (two) times daily. 30 g 1   diclofenac sodium (VOLTAREN) 1 % GEL Apply topically as needed.      fluticasone (FLONASE) 50 MCG/ACT nasal spray Place 1 spray into both nostrils daily.  Ginkgo Biloba (GNP GINGKO BILOBA EXTRACT PO) Take 1 tablet by mouth daily.     Glucosamine Sulfate 500 MG CAPS Take 500 mg by mouth daily.     hydrALAZINE (APRESOLINE) 10 MG tablet Take 1 tablet (10 mg total) by mouth 3 (three) times daily. As needed for sbp  170 or higher 30 tablet 0   isosorbide mononitrate (IMDUR) 30 MG 24 hr tablet Take 0.5 tablets (15 mg total) by mouth daily. 45 tablet 0   Lactobacillus (PROBIOTIC ACIDOPHILUS PO) Take 1 capsule by mouth daily.      loratadine (CLARITIN) 10 MG tablet Take 10 mg by mouth daily as needed for allergies.     Magnesium 400 MG CAPS Take 400 mg by mouth daily.     metoprolol succinate  (TOPROL XL) 25 MG 24 hr tablet Take 0.5 tablets (12.5 mg total) by mouth daily. 45 tablet 0   Multiple Vitamin (MULTIVITAMIN) capsule Take 1 capsule by mouth daily.     mupirocin ointment (BACTROBAN) 2 % Apply to right leg wound daily - a small dab as needed once a day. 15 g 0   nitroGLYCERIN (NITROSTAT) 0.4 MG SL tablet Place 1 tablet (0.4 mg total) under the tongue every 5 (five) minutes as needed for chest pain. 50 tablet 3   Omega-3 Fatty Acids (OMEGA 3 PO) Take 520 mg by mouth daily.      Pramoxine-HC (HYDROCORTISONE ACE-PRAMOXINE) 2.5-1 % CREA Apply topically as needed.      vitamin B-12 (CYANOCOBALAMIN) 1000 MCG tablet Take 1,000 mcg by mouth daily.     Turmeric 450 MG CAPS Take 450 Units by mouth daily.  (Patient not taking: Reported on 11/16/2021)     No facility-administered medications prior to visit.    Review of Systems;  Patient denies headache, fevers, malaise, unintentional weight loss, skin rash, eye pain, sinus congestion and sinus pain, sore throat, dysphagia,  hemoptysis , cough, dyspnea, wheezing, chest pain, palpitations, orthopnea, edema, abdominal pain, nausea, melena, diarrhea, constipation, flank pain, dysuria, hematuria, urinary  Frequency, nocturia, numbness, tingling, seizures,  Focal weakness, Loss of consciousness,  Tremor, insomnia, depression, anxiety, and suicidal ideation.      Objective:  BP (!) 160/74 (BP Location: Left Arm, Patient Position: Sitting, Cuff Size: Normal)    Pulse 64    Temp 97.6 F (36.4 C) (Oral)    Ht 5' (1.524 m)    Wt 101 lb 3.2 oz (45.9 kg)    BMI 19.76 kg/m   BP Readings from Last 3 Encounters:  11/16/21 (!) 160/74  09/08/21 (!) 144/80  08/13/21 118/62    Wt Readings from Last 3 Encounters:  11/16/21 101 lb 3.2 oz (45.9 kg)  09/08/21 101 lb 9.6 oz (46.1 kg)  08/13/21 100 lb (45.4 kg)    General appearance: alert, cooperative and appears stated age Ears: normal TM's and external ear canals both ears Throat: lips, mucosa, and  tongue normal; teeth and gums normal Neck: no adenopathy, no carotid bruit, supple, symmetrical, trachea midline and thyroid not enlarged, symmetric, no tenderness/mass/nodules Back: symmetric, no curvature. ROM normal. No CVA tenderness. Lungs: clear to auscultation bilaterally Heart: regular rate and rhythm, S1, S2 normal, no murmur, click, rub or gallop Abdomen: soft, non-tender; bowel sounds normal; no masses,  no organomegaly Pulses: 2+ and symmetric Skin: Skin color, texture, turgor normal. No rashes or lesions Lymph nodes: Cervical, supraclavicular, and axillary nodes normal.  Lab Results  Component Value Date   HGBA1C 7.0 (H) 11/16/2021   HGBA1C  6.1 (H) 05/14/2019   HGBA1C 6.2 (H) 01/01/2019    Lab Results  Component Value Date   CREATININE 0.61 11/16/2021   CREATININE 0.54 06/12/2021   CREATININE 0.62 04/01/2021    Lab Results  Component Value Date   WBC 4.0 11/16/2021   HGB 13.3 11/16/2021   HCT 41.2 11/16/2021   PLT 200.0 11/16/2021   GLUCOSE 74 11/16/2021   CHOL 204 (H) 11/16/2021   TRIG 127.0 11/16/2021   HDL 68.10 11/16/2021   LDLCALC 110 (H) 11/16/2021   ALT 17 11/16/2021   AST 32 11/16/2021   NA 139 11/16/2021   K 4.2 11/16/2021   CL 103 11/16/2021   CREATININE 0.61 11/16/2021   BUN 13 11/16/2021   CO2 30 11/16/2021   TSH 1.433 06/12/2021   HGBA1C 7.0 (H) 11/16/2021   MICROALBUR <0.7 11/16/2021    DG Chest 2 View  Result Date: 10/28/2021 CLINICAL DATA:  Cough and flu like symptoms. EXAM: CHEST - 2 VIEW COMPARISON:  None. FINDINGS: The lungs are hyperinflated. Mild, diffuse, chronic appearing increased lung markings are seen. There is no evidence of acute infiltrate, pleural effusion or pneumothorax. The heart size and mediastinal contours are within normal limits. Chronic third, sixth, seventh and eighth right rib fractures are seen. IMPRESSION: No active cardiopulmonary disease. Electronically Signed   By: Virgina Norfolk M.D.   On: 10/28/2021  23:45    Assessment & Plan:   Problem List Items Addressed This Visit     Hyperlipidemia - Primary    Currently untreated based on cardiology's rec's. 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  Taking vitamin D and calciun      Accelerated hypertension    Increasing imdur dose to 30 mg.  encouarged to avoid over management of blood  pressure      Relevant Orders   Comp Met (CMET) (Completed)   Urine Microalbumin w/creat. ratio (Completed)   Anxiety about health    She is not afraid of death and dying.  She is afraid of becoming a burden  to her daughters.       Other Visit Diagnoses     Long-term use of high-risk medication       Relevant Orders   CBC with Differential/Platelet (Completed)   Vitamin D deficiency       Relevant Orders   VITAMIN D 25 Hydroxy (Vit-D Deficiency, Fractures) (Completed)       Meds ordered this encounter  Medications   Zoster Vaccine Adjuvanted [facility]) injection    Sig: Inject 0.5 mLs into the muscle once for 1 dose.    Dispense:  1 each    Refill:  1     I provided  30 minutes of  face-to-face time during this encounter reviewing patient's current problems and past surgeries, labs and imaging studies, providing counseling on the above mentioned problems , and coordination  of care .   Follow-up: Return in about 6 months (around 05/16/2022).   Crecencio Mc, MD

## 2021-11-18 DIAGNOSIS — H16223 Keratoconjunctivitis sicca, not specified as Sjogren's, bilateral: Secondary | ICD-10-CM | POA: Diagnosis not present

## 2021-11-23 ENCOUNTER — Other Ambulatory Visit
Admission: RE | Admit: 2021-11-23 | Discharge: 2021-11-23 | Disposition: A | Discharge status: Home / Self Care | Payer: Medicare Other | Attending: Ophthalmology | Admitting: Ophthalmology

## 2021-11-23 DIAGNOSIS — G5731 Lesion of lateral popliteal nerve, right lower limb: Secondary | ICD-10-CM | POA: Diagnosis not present

## 2021-11-23 DIAGNOSIS — H532 Diplopia: Secondary | ICD-10-CM | POA: Diagnosis not present

## 2021-11-23 DIAGNOSIS — H4921 Sixth [abducent] nerve palsy, right eye: Secondary | ICD-10-CM | POA: Diagnosis not present

## 2021-11-23 LAB — CBC WITH DIFFERENTIAL/PLATELET
Abs Immature Granulocytes: 0.02 10*3/uL (ref 0.00–0.07)
Basophils Absolute: 0.1 10*3/uL (ref 0.0–0.1)
Basophils Relative: 1 %
Eosinophils Absolute: 0.1 10*3/uL (ref 0.0–0.5)
Eosinophils Relative: 2 %
HCT: 42.3 % (ref 36.0–46.0)
Hemoglobin: 14.2 g/dL (ref 12.0–15.0)
Immature Granulocytes: 1 %
Lymphocytes Relative: 35 %
Lymphs Abs: 1.5 10*3/uL (ref 0.7–4.0)
MCH: 30.3 pg (ref 26.0–34.0)
MCHC: 33.6 g/dL (ref 30.0–36.0)
MCV: 90.4 fL (ref 80.0–100.0)
Monocytes Absolute: 0.4 10*3/uL (ref 0.1–1.0)
Monocytes Relative: 10 %
Neutro Abs: 2.1 10*3/uL (ref 1.7–7.7)
Neutrophils Relative %: 51 %
Platelets: 209 10*3/uL (ref 150–400)
RBC: 4.68 MIL/uL (ref 3.87–5.11)
RDW: 13.2 % (ref 11.5–15.5)
WBC: 4.2 10*3/uL (ref 4.0–10.5)
nRBC: 0 % (ref 0.0–0.2)

## 2021-11-23 LAB — SEDIMENTATION RATE: Sed Rate: 10 mm/hr (ref 0–30)

## 2021-11-23 LAB — C-REACTIVE PROTEIN: CRP: 0.7 mg/dL (ref <1.0)

## 2021-11-24 ENCOUNTER — Other Ambulatory Visit: Payer: Self-pay | Admitting: Ophthalmology

## 2021-11-24 DIAGNOSIS — H4921 Sixth [abducent] nerve palsy, right eye: Secondary | ICD-10-CM

## 2021-12-03 ENCOUNTER — Ambulatory Visit
Admission: RE | Admit: 2021-12-03 | Discharge: 2021-12-03 | Disposition: A | Discharge status: Home / Self Care | Payer: Medicare Other | Source: Ambulatory Visit | Attending: Ophthalmology | Admitting: Ophthalmology

## 2021-12-03 ENCOUNTER — Other Ambulatory Visit: Payer: Self-pay

## 2021-12-03 DIAGNOSIS — H4921 Sixth [abducent] nerve palsy, right eye: Secondary | ICD-10-CM | POA: Insufficient documentation

## 2021-12-03 DIAGNOSIS — I6782 Cerebral ischemia: Secondary | ICD-10-CM | POA: Diagnosis not present

## 2021-12-03 MED ORDER — GADOBUTROL 1 MMOL/ML IV SOLN
5.0000 mL | Freq: Once | INTRAVENOUS | Status: AC | PRN
  Administered 2021-12-03: 5 mL via INTRAVENOUS

## 2021-12-08 ENCOUNTER — Other Ambulatory Visit: Payer: Self-pay

## 2021-12-08 MED ORDER — METOPROLOL SUCCINATE ER 25 MG PO TB24: 12.5000 mg | ORAL_TABLET | Freq: Every day | ORAL | 2 refills | Status: DC

## 2021-12-14 ENCOUNTER — Other Ambulatory Visit: Payer: Self-pay | Admitting: Internal Medicine

## 2021-12-18 ENCOUNTER — Telehealth: Payer: Self-pay | Admitting: Cardiology

## 2021-12-18 NOTE — Telephone Encounter (Signed)
Patient states that her PCP has increased her Isosorbide last week, and she would like to know if she should get a refill with the new prescription that Dr. Derrel Nip changed, or stay with what she originally was taking. Please call to discuss.

## 2021-12-18 NOTE — Telephone Encounter (Signed)
Spoke with Dr. Garen Lah and he agreed with the dose increase of patients Isosorbide to 30 MG once a day.  Left a detailed VM per DPR on file.

## 2021-12-21 DIAGNOSIS — H4921 Sixth [abducent] nerve palsy, right eye: Secondary | ICD-10-CM | POA: Diagnosis not present

## 2021-12-22 DIAGNOSIS — Z20822 Contact with and (suspected) exposure to covid-19: Secondary | ICD-10-CM | POA: Diagnosis not present

## 2021-12-28 ENCOUNTER — Other Ambulatory Visit (INDEPENDENT_AMBULATORY_CARE_PROVIDER_SITE_OTHER): Payer: Self-pay | Admitting: Nurse Practitioner

## 2021-12-28 DIAGNOSIS — I700 Atherosclerosis of aorta: Secondary | ICD-10-CM

## 2021-12-28 DIAGNOSIS — K551 Chronic vascular disorders of intestine: Secondary | ICD-10-CM

## 2021-12-29 ENCOUNTER — Encounter (INDEPENDENT_AMBULATORY_CARE_PROVIDER_SITE_OTHER): Payer: Self-pay | Admitting: Vascular Surgery

## 2021-12-29 ENCOUNTER — Ambulatory Visit (INDEPENDENT_AMBULATORY_CARE_PROVIDER_SITE_OTHER): Payer: Medicare Other

## 2021-12-29 ENCOUNTER — Ambulatory Visit (INDEPENDENT_AMBULATORY_CARE_PROVIDER_SITE_OTHER): Payer: Medicare Other | Admitting: Vascular Surgery

## 2021-12-29 ENCOUNTER — Other Ambulatory Visit: Payer: Self-pay

## 2021-12-29 VITALS — BP 162/72 | HR 62 | Resp 16 | Wt 100.0 lb

## 2021-12-29 DIAGNOSIS — I700 Atherosclerosis of aorta: Secondary | ICD-10-CM | POA: Diagnosis not present

## 2021-12-29 DIAGNOSIS — I771 Stricture of artery: Secondary | ICD-10-CM

## 2021-12-29 DIAGNOSIS — L97511 Non-pressure chronic ulcer of other part of right foot limited to breakdown of skin: Secondary | ICD-10-CM

## 2021-12-29 DIAGNOSIS — I774 Celiac artery compression syndrome: Secondary | ICD-10-CM

## 2021-12-29 DIAGNOSIS — K551 Chronic vascular disorders of intestine: Secondary | ICD-10-CM | POA: Diagnosis not present

## 2021-12-29 DIAGNOSIS — E782 Mixed hyperlipidemia: Secondary | ICD-10-CM | POA: Diagnosis not present

## 2021-12-29 NOTE — Assessment & Plan Note (Signed)
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  This intervention was originally done in 2018.  We have been doing 74-month follow-up intervals and we will continue that for now.  Continue current medical regimen.

## 2021-12-29 NOTE — Progress Notes (Signed)
MRN : [id number]  [name] is a 88 y.o. (1927/09/26) female who presents with chief complaint of  Chief Complaint  Patient presents with   Follow-up    Ultrasound follow up  .  History of Present Illness: Patient returns today in follow up of her mesenteric disease as well as evaluation of perfusion for nonhealing ulcer on the right.  This ulcer has been slow to heal and persistent.  It is not overly painful.  She does not have claudication or rest pain symptoms. ABIs were checked today and were entirely normal at 1.18 on the right and 1.04 on the left.  Her waveforms are triphasic. She is status post celiac artery stent placement previously for chronic visceral ischemic symptoms.  She still has intermittent abdominal pain but her weight is stable and she is eating well.  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  Current Outpatient Medications  Medication Sig Dispense Refill   acetaminophen (TYLENOL) 500 MG tablet Take 500 mg by mouth every 6 (six) hours as needed for mild pain.     Alfalfa 500 MG TABS Take 500 mg by mouth daily.     Ascorbic Acid (VITAMIN C CR) 500 MG CPCR Take 500 mg by mouth daily.     Calcium Carbonate (CALCIUM 600 PO) Take by mouth.     Cholecalciferol (VITAMIN D3) 3000 UNITS TABS Take 3,000 Units by mouth 2 (two) times daily.      CINNAMON PO Take 1,000 mg by mouth daily.     clotrimazole-betamethasone (LOTRISONE) cream Apply topically 2 (two) times daily. 30 g 1   diclofenac sodium (VOLTAREN) 1 % GEL Apply topically as needed.      fluticasone (FLONASE) 50 MCG/ACT nasal spray Place 1 spray into both nostrils daily.     Ginkgo Biloba (GNP GINGKO BILOBA EXTRACT PO) Take 1 tablet by mouth daily.     Glucosamine Sulfate 500 MG CAPS Take 500 mg by mouth daily.     hydrALAZINE (APRESOLINE) 10 MG tablet TAKE 1 TABLET BY MOUTH THREE TIMES DAILY AS NEEDED FOR  SBP  170  OR   HIGHER 30 tablet 0   isosorbide mononitrate (IMDUR) 30 MG 24 hr tablet Take 1 tablet (30 mg total) by mouth daily. 90 tablet 1   Lactobacillus (PROBIOTIC ACIDOPHILUS PO) Take 1 capsule by mouth daily.      loratadine (CLARITIN) 10 MG tablet Take 10 mg by mouth daily as needed for allergies.     Magnesium 400 MG CAPS Take 400 mg by mouth daily.     metoprolol succinate (TOPROL XL) 25 MG 24 hr tablet Take 0.5 tablets (12.5 mg total) by mouth daily. 45 tablet 2   Multiple Vitamin (MULTIVITAMIN) capsule Take 1 capsule by mouth daily.     mupirocin ointment (BACTROBAN) 2 % Apply to right leg wound daily - a small dab as needed once a day. 15 g 0   nitroGLYCERIN (NITROSTAT) 0.4 MG SL tablet DISSOLVE ONE TABLET UNDER THE TONGUE EVERY 5 MINUTES AS NEEDED FOR CHEST PAIN.  DO NOT EXCEED A TOTAL OF 3 DOSES IN 15 MINUTES 50 tablet 0   Omega-3 Fatty Acids (OMEGA 3 PO) Take 520 mg by mouth daily.      Pramoxine-HC (HYDROCORTISONE ACE-PRAMOXINE) 2.5-1 % CREA Apply topically as needed.      vitamin B-12 (CYANOCOBALAMIN) 1000 MCG tablet Take 1,000 mcg by mouth daily.  No current facility-administered medications for this visit.    Past Medical History:  Diagnosis Date   Allergy    Arthritis    Atherosclerosis    Atherosclerosis of abdominal aorta (HCC)    Chronic kidney disease    stage 2   CKD (chronic kidney disease) stage 2, GFR 60-89 ml/min    Collagen vascular disease (HCC)    Compression fracture of thoracic spine, non-traumatic ([place]) 08/19/2015   Seen on plain films October 2016   Diverticulosis 2008   GERD (gastroesophageal reflux disease)    Heart murmur    Hyperlipidemia    Lumbar spinal stenosis    Osteoporosis    Ovarian lump    RA (rheumatoid arthritis) ([place])    Raynaud's disease    Rheumatoid arthritis ([place])    Spinal stenosis     Past Surgical History:  Procedure Laterality Date   BREAST SURGERY Left 1965   Benign biopsy   CHOLECYSTECTOMY  2005   [place]    COLONOSCOPY  2008   Dr. Donnella Sham   EXCISION OF BREAST BIOPSY Left 1965   benign   EYE SURGERY Bilateral    Cataract Extraction with IOL   KYPHOPLASTY N/A 12/17/2016   Procedure: KYPHOPLASTY;  Surgeon: Hessie Knows, MD;  Location: [facility];  Service: Orthopedics;  Laterality: N/A;  l4   LUMBAR LAMINECTOMY  05/09/2017   L2-L5   VISCERAL ANGIOGRAPHY N/A 08/15/2017   Procedure: VISCERAL ANGIOGRAPHY;  Surgeon: Algernon Huxley, MD;  Location: [place] [facility];  Service: Cardiovascular;  Laterality: N/A;   VISCERAL ANGIOGRAPHY N/A 10/05/2017   Procedure: VISCERAL ANGIOGRAPHY;  Surgeon: Algernon Huxley, MD;  Location: [place] [facility];  Service: Cardiovascular;  Laterality: N/A;   VISCERAL ARTERY INTERVENTION N/A 08/15/2017   Procedure: VISCERAL ARTERY INTERVENTION;  Surgeon: Algernon Huxley, MD;  Location: [facility] [facility];  Service: Cardiovascular;  Laterality: N/A;     Social History   Tobacco Use   Smoking status: Never   Smokeless tobacco: Never   Tobacco comments:    smoking cessation materials not required  Vaping Use   Vaping Use: Never used  Substance Use Topics   Alcohol use: No    Alcohol/week: 0.0 standard drinks   Drug use: No      Family History  Problem Relation Age of Onset   Heart disease Mother    Hypertension Father    Heart disease Brother 76       heart attack   Cancer Brother        bladder   Diabetes Sister    Alcohol abuse Sister    Kidney disease Sister    Diabetes Brother    Stroke Brother    Diabetes Maternal Grandmother    Heart disease Maternal Grandmother    Cancer Brother        lung     Allergies  Allergen Reactions   Ibandronic Acid Other (See Comments)    Achy all over. Flu like S/S   Other Other (See Comments)    Achy all over. Flu like S/S Dysphagia   Prednisone Rash   Actonel [Risedronate] Other (See Comments)    Dysphagia   Amoxicillin Hives   Pantoprazole Other (See Comments)    Little blistery bumps   Raloxifene Other  (See Comments)    Mood swings   Versed [Midazolam] Other (See Comments)    Difficult waking up and memory loss   Doxycycline Rash    REVIEW OF SYSTEMS (Negative  unless checked)   Constitutional: '[x]' Weight loss  '[]' Fever  '[]' Chills Cardiac: '[]' Chest pain   '[]' Chest pressure   '[x]' Palpitations   '[]' Shortness of breath when laying flat   '[]' Shortness of breath at rest   '[]' Shortness of breath with exertion. Vascular:  '[]' Pain in legs with walking   '[]' Pain in legs at rest   '[]' Pain in legs when laying flat   '[]' Claudication   '[]' Pain in feet when walking  '[]' Pain in feet at rest  '[]' Pain in feet when laying flat   '[]' History of DVT   '[]' Phlebitis   '[]' Swelling in legs   '[x]' Varicose veins   '[]' Non-healing ulcers Pulmonary:   '[]' Uses home oxygen   '[]' Productive cough   '[]' Hemoptysis   '[]' Wheeze  '[]' COPD   '[]' Asthma Neurologic:  '[]' Dizziness  '[]' Blackouts   '[]' Seizures   '[]' History of stroke   '[]' History of TIA  '[]' Aphasia   '[]' Temporary blindness   '[]' Dysphagia   '[]' Weakness or numbness in arms   '[]' Weakness or numbness in legs Musculoskeletal:  '[x]' Arthritis   '[]' Joint swelling   '[]' Joint pain   '[x]' Low back pain Hematologic:  '[]' Easy bruising  '[]' Easy bleeding   '[]' Hypercoagulable state   '[]' Anemic   Gastrointestinal:  '[]' Blood in stool   '[]' Vomiting blood  '[x]' Gastroesophageal reflux/heartburn   '[x]' Abdominal pain Genitourinary:  '[x]' Chronic kidney disease   '[]' Difficult urination  '[]' Frequent urination  '[]' Burning with urination   '[]' Hematuria Skin:  '[]' Rashes   '[]' Ulcers   '[]' Wounds Psychological:  '[x]' History of anxiety   '[]'  History of major depression.  Physical Examination  BP (!) 162/72 (BP Location: Left Arm)    Pulse 62    Resp 16    Wt 100 lb (45.4 kg)    BMI 19.53 kg/m  Gen:  WD/WN, NAD.  Appears far younger than stated age Head: [place]/AT, No temporalis wasting. Ear/Nose/Throat: Hearing grossly intact, nares w/o erythema or drainage Eyes: Right eye with a covering patch today Neck: Supple.  Trachea midline Pulmonary:  Good air movement,  no use of accessory muscles.  Cardiac: Irregular Vascular:  Vessel Right Left  Radial Palpable Palpable                          PT Palpable Palpable  DP Palpable Palpable   Gastrointestinal: soft, non-tender/non-distended. No guarding/reflex.  Musculoskeletal: M/S 5/5 throughout.  No deformity or atrophy.  No significant lower extremity edema. Neurologic: Sensation grossly intact in extremities.  Symmetrical.  Speech is fluent.  Psychiatric: Judgment intact, Mood & affect appropriate for pt's clinical situation. Dermatologic: Right foot wound currently dressed      Labs Recent Results (from the past 2160 hour(s))  Lipid Profile     Status: Abnormal   Collection Time: 11/16/21 10:07 AM  Result Value Ref Range   Cholesterol 204 (H) 0 - 200 mg/dL    Comment: ATP III Classification       Desirable:  < 200 mg/dL               Borderline High:  200 - 239 mg/dL          High:  > = 240 mg/dL   Triglycerides 127.0 0.0 - 149.0 mg/dL    Comment: Normal:  <150 mg/dLBorderline High:  150 - 199 mg/dL   HDL 68.10 >39.00 mg/dL   VLDL 25.4 0.0 - 40.0 mg/dL   LDL Cholesterol 110 (H) 0 - 99 mg/dL   Total CHOL/HDL Ratio 3     Comment:  Men          Women1/2 Average Risk     3.4          3.3Average Risk          5.0          4.42X Average Risk          9.6          7.13X Average Risk          15.0          11.0                       NonHDL 135.76     Comment: NOTE:  Non-HDL goal should be 30 mg/dL higher than patient's LDL goal (i.e. LDL goal of < 70 mg/dL, would have non-HDL goal of < 100 mg/dL)  Comp Met (CMET)     Status: None   Collection Time: 11/16/21 10:07 AM  Result Value Ref Range   Sodium 139 135 - 145 mEq/L   Potassium 4.2 3.5 - 5.1 mEq/L   Chloride 103 96 - 112 mEq/L   CO2 30 19 - 32 mEq/L   Glucose, Bld 74 70 - 99 mg/dL   BUN 13 6 - 23 mg/dL   Creatinine, Ser 0.61 0.40 - 1.20 mg/dL   Total Bilirubin 0.4 0.2 - 1.2 mg/dL   Alkaline Phosphatase 46 39 - 117 U/L    AST 32 0 - 37 U/L   ALT 17 0 - 35 U/L   Total Protein 6.3 6.0 - 8.3 g/dL   Albumin 3.8 3.5 - 5.2 g/dL   GFR 77.80 >60.00 mL/min    Comment: Calculated using the CKD-EPI Creatinine Equation (2021)   Calcium 9.0 8.4 - 10.5 mg/dL  Urine Microalbumin w/creat. ratio     Status: None   Collection Time: 11/16/21 10:07 AM  Result Value Ref Range   Microalb, Ur <0.7 0.0 - 1.9 mg/dL   Creatinine,U 25.7 mg/dL   Microalb Creat Ratio 2.7 0.0 - 30.0 mg/g  HgB A1c     Status: Abnormal   Collection Time: 11/16/21 10:07 AM  Result Value Ref Range   Hgb A1c MFr Bld 7.0 (H) 4.6 - 6.5 %    Comment: Glycemic Control Guidelines for People with Diabetes:Non Diabetic:  <6%Goal of Therapy: <7%Additional Action Suggested:  >8%   CBC with Differential/Platelet     Status: Abnormal   Collection Time: 11/16/21 10:07 AM  Result Value Ref Range   WBC 4.0 4.0 - 10.5 K/uL   RBC 4.53 3.87 - 5.11 Mil/uL   Hemoglobin 13.3 12.0 - 15.0 g/dL   HCT 41.2 36.0 - 46.0 %   MCV 91.0 78.0 - 100.0 fl   MCHC 32.4 30.0 - 36.0 g/dL   RDW 13.2 11.5 - 15.5 %   Platelets 200.0 150.0 - 400.0 K/uL   Neutrophils Relative % 57.0 43.0 - 77.0 %   Lymphocytes Relative 27.1 12.0 - 46.0 %   Monocytes Relative 13.1 (H) 3.0 - 12.0 %   Eosinophils Relative 1.5 0.0 - 5.0 %   Basophils Relative 1.3 0.0 - 3.0 %   Neutro Abs 2.3 1.4 - 7.7 K/uL   Lymphs Abs 1.1 0.7 - 4.0 K/uL   Monocytes Absolute 0.5 0.1 - 1.0 K/uL   Eosinophils Absolute 0.1 0.0 - 0.7 K/uL   Basophils Absolute 0.1 0.0 - 0.1 K/uL  VITAMIN D 25 Hydroxy (Vit-D Deficiency, Fractures)     Status:  None   Collection Time: 11/16/21 10:07 AM  Result Value Ref Range   VITD 33.43 30.00 - 100.00 ng/mL  CBC with Differential/Platelet     Status: None   Collection Time: 11/23/21  3:10 PM  Result Value Ref Range   WBC 4.2 4.0 - 10.5 K/uL   RBC 4.68 3.87 - 5.11 MIL/uL   Hemoglobin 14.2 12.0 - 15.0 g/dL   HCT 42.3 36.0 - 46.0 %   MCV 90.4 80.0 - 100.0 fL   MCH 30.3 26.0 - 34.0 pg    MCHC 33.6 30.0 - 36.0 g/dL   RDW 13.2 11.5 - 15.5 %   Platelets 209 150 - 400 K/uL   nRBC 0.0 0.0 - 0.2 %   Neutrophils Relative % 51 %   Neutro Abs 2.1 1.7 - 7.7 K/uL   Lymphocytes Relative 35 %   Lymphs Abs 1.5 0.7 - 4.0 K/uL   Monocytes Relative 10 %   Monocytes Absolute 0.4 0.1 - 1.0 K/uL   Eosinophils Relative 2 %   Eosinophils Absolute 0.1 0.0 - 0.5 K/uL   Basophils Relative 1 %   Basophils Absolute 0.1 0.0 - 0.1 K/uL   Immature Granulocytes 1 %   Abs Immature Granulocytes 0.02 0.00 - 0.07 K/uL    Comment: Performed at [facility], [place], [place] [address]  C-reactive protein     Status: None   Collection Time: 11/23/21  3:10 PM  Result Value Ref Range   CRP 0.7 <1.0 mg/dL    Comment: Performed at [facility], [address] [place], [place] [address]  Sedimentation rate     Status: None   Collection Time: 11/23/21  3:10 PM  Result Value Ref Range   Sed Rate 10 0 - 30 mm/hr    Comment: Performed at [facility], [address] [place], [place] [address]    Radiology MR BRAIN W WO CONTRAST  Result Date: 12/05/2021 CLINICAL DATA:  Sixth nerve palsy of right eye EXAM: MRI HEAD WITHOUT AND WITH CONTRAST TECHNIQUE: Multiplanar, multiecho pulse sequences of the brain and surrounding structures were obtained without and with intravenous contrast. CONTRAST:  15m GADAVIST GADOBUTROL 1 MMOL/ML IV SOLN COMPARISON:  None. FINDINGS: Brain: There is no acute infarction or intracranial hemorrhage. There is no intracranial mass, mass effect, or edema. There is no hydrocephalus or extra-axial fluid collection. Prominence of the ventricles and sulci reflects parenchymal volume loss. Patchy and confluent T2 hyperintensity in the supratentorial and pontine white matter is nonspecific but may reflect moderate chronic microvascular ischemic changes. No abnormal enhancement. Vascular: Major vessel flow voids at the skull base are preserved. Skull and  upper cervical spine: Normal marrow signal is preserved. Sinuses/Orbits: Paranasal sinuses are aerated. Bilateral lens replacements. Other: Sella is unremarkable.  Mastoid air cells are clear. IMPRESSION: No evidence of recent infarction, hemorrhage, or mass. No abnormal enhancement. Moderate chronic microvascular ischemic changes. Electronically Signed   By: PMacy MisM.D.   On: 12/05/2021 07:14    Assessment/Plan Hyperlipidemia lipid control important in reducing the progression of atherosclerotic disease.    Chronic kidney disease, stage III (moderate) Given the need to limit contrast, we will be cautious with any procedures.   Atherosclerosis of abdominal aorta (HCC) Known and not the cause of her abdominal pain  Ulcerated, foot, right, limited to breakdown of skin (HCC) ABIs were checked today and were entirely normal at 1.18 on the right and 1.04 on the left.  Her  waveforms are triphasic.  Her ulceration is not from lack of arterial flow and she has adequate flow for wound healing.  Recheck as needed.  Celiac artery stenosis (HCC) Duplex today shows the celiac artery stent to be widely patent with normal velocities in the superior mesenteric artery as well.  This intervention was originally done in 2018.  We have been doing 44-monthfollow-up intervals and we will continue that for now.  Continue current medical regimen.    JLeotis Pain MD  12/29/2021 10:17 AM    This note was created with Dragon medical transcription system.  Any errors from dictation are purely unintentional

## 2021-12-29 NOTE — Assessment & Plan Note (Signed)
ABIs were checked today and were entirely normal at 1.18 on the right and 1.04 on the left.  Her waveforms are triphasic.  Her ulceration is not from lack of arterial flow and she has adequate flow for wound healing.  Recheck as needed.

## 2022-01-11 ENCOUNTER — Telehealth: Payer: Self-pay | Admitting: Cardiology

## 2022-01-11 DIAGNOSIS — H4921 Sixth [abducent] nerve palsy, right eye: Secondary | ICD-10-CM | POA: Diagnosis not present

## 2022-01-11 MED ORDER — ISOSORBIDE MONONITRATE ER 30 MG PO TB24: 30.0000 mg | ORAL_TABLET | Freq: Every day | ORAL | 3 refills | Status: DC

## 2022-01-11 NOTE — Telephone Encounter (Signed)
Called patient back and informed her that I sent the refill in for her as requested. Patient was very grateful for the follow up. ?

## 2022-01-11 NOTE — Telephone Encounter (Signed)
Pt c/o medication issue: ? ?1. Name of Medication: isosorbide  ? ?2. How are you currently taking this medication (dosage and times per day)? 30 mg po q d  ? ?3. Are you having a reaction (difficulty breathing--STAT)? no ? ?4. What is your medication issue?  Pcp increased and now patient is out of meds early.  Patient is asking for refill if md approves or if pcp needs to manage .  ? ?

## 2022-01-25 ENCOUNTER — Telehealth: Payer: Self-pay

## 2022-01-25 NOTE — Telephone Encounter (Signed)
Patient left a voicemail about making a appointment. Called and left a message for call back  ?

## 2022-01-26 NOTE — Telephone Encounter (Signed)
Pt returned call from yesterday left message. ?

## 2022-02-09 DIAGNOSIS — R768 Other specified abnormal immunological findings in serum: Secondary | ICD-10-CM | POA: Diagnosis not present

## 2022-02-09 DIAGNOSIS — M19041 Primary osteoarthritis, right hand: Secondary | ICD-10-CM | POA: Diagnosis not present

## 2022-02-09 DIAGNOSIS — M19011 Primary osteoarthritis, right shoulder: Secondary | ICD-10-CM | POA: Diagnosis not present

## 2022-02-09 DIAGNOSIS — M19012 Primary osteoarthritis, left shoulder: Secondary | ICD-10-CM | POA: Diagnosis not present

## 2022-02-09 DIAGNOSIS — M19042 Primary osteoarthritis, left hand: Secondary | ICD-10-CM | POA: Diagnosis not present

## 2022-02-09 DIAGNOSIS — M810 Age-related osteoporosis without current pathological fracture: Secondary | ICD-10-CM | POA: Diagnosis not present

## 2022-02-10 DIAGNOSIS — H4921 Sixth [abducent] nerve palsy, right eye: Secondary | ICD-10-CM | POA: Diagnosis not present

## 2022-02-20 ENCOUNTER — Encounter: Payer: Self-pay | Admitting: Emergency Medicine

## 2022-02-20 ENCOUNTER — Ambulatory Visit
Admission: EM | Admit: 2022-02-20 | Discharge: 2022-02-20 | Disposition: A | Discharge status: Home / Self Care | Payer: Medicare Other

## 2022-02-20 DIAGNOSIS — H66001 Acute suppurative otitis media without spontaneous rupture of ear drum, right ear: Secondary | ICD-10-CM | POA: Diagnosis not present

## 2022-02-20 DIAGNOSIS — R519 Headache, unspecified: Secondary | ICD-10-CM

## 2022-02-20 DIAGNOSIS — H6121 Impacted cerumen, right ear: Secondary | ICD-10-CM

## 2022-02-20 DIAGNOSIS — J329 Chronic sinusitis, unspecified: Secondary | ICD-10-CM

## 2022-02-20 LAB — POCT URINALYSIS DIP (MANUAL ENTRY)
Bilirubin, UA: NEGATIVE
Blood, UA: NEGATIVE
Glucose, UA: NEGATIVE mg/dL
Ketones, POC UA: NEGATIVE mg/dL
Leukocytes, UA: NEGATIVE
Nitrite, UA: NEGATIVE
Protein Ur, POC: NEGATIVE mg/dL
Spec Grav, UA: 1.015 (ref 1.010–1.025)
Urobilinogen, UA: 0.2 E.U./dL
pH, UA: 5 (ref 5.0–8.0)

## 2022-02-20 MED ORDER — SULFAMETHOXAZOLE-TRIMETHOPRIM 800-160 MG PO TABS: 1.0000 | ORAL_TABLET | Freq: Two times a day (BID) | ORAL | 0 refills | Status: AC

## 2022-02-20 NOTE — ED Triage Notes (Signed)
Pt states she woke up with right side sinus pain and right ear pain. She is also having right side rib pain that radiates to her back.  ?

## 2022-02-20 NOTE — ED Provider Notes (Signed)
?[place] ? ? ? ?CSN: [id number] ?Arrival date & time: 02/20/22  1105 ? ? ?  ? ?History   ?Chief Complaint ?Chief Complaint  ?Patient presents with  ? Otalgia  ? Sinus Pressure  ? ? ?HPI ?[name] is a 88 y.o. female.  ? ?HPI ?Patient is here for evaluation of right ear pain, right facial pain, and right rib pain. Patient reports these symptoms occurred upon awakening this morning and she is currently not experiencing any ear pain, facial pain or weakness. She became concern as she didn't feel right when these symptoms occurred. She denies dizziness, numbness or tingling, chest pain or shortness of breath. She endorses falling over one month ago, but did not injure herself and noticed the rib pain only today. She also reports lower abdominal discomfort intermittently. No dysuria although experiences urine frequency routinely. She has not taken any medication for her symptoms today. ?Past Medical History:  ?Diagnosis Date  ? Allergy   ? Arthritis   ? Atherosclerosis   ? Atherosclerosis of abdominal aorta ([place])   ? Chronic kidney disease   ? stage 2  ? CKD (chronic kidney disease) stage 2, GFR 60-89 ml/min   ? Collagen vascular disease ([place])   ? Compression fracture of thoracic spine, non-traumatic ([place]) 08/19/2015  ? Seen on plain films October 2016  ? Diverticulosis 2008  ? GERD (gastroesophageal reflux disease)   ? Heart murmur   ? Hyperlipidemia   ? Lumbar spinal stenosis   ? Osteoporosis   ? Ovarian lump   ? RA (rheumatoid arthritis) ([place])   ? Raynaud's disease   ? Rheumatoid arthritis ([place])   ? Spinal stenosis   ? ? ?Patient Active Problem List  ? Diagnosis Date Noted  ? Ulcerated, foot, right, limited to breakdown of skin ([place]) 12/29/2021  ? History of myocardial infarction due to demand ischemia 06/04/2021  ? Acute COVID-19 06/04/2021  ? Trigger finger, right index finger 05/17/2021  ? Anxiety about health 04/02/2021  ? History of hepatitis A 09/16/2020  ? Leg pain, right 08/28/2020  ?  Need for immunization against influenza 08/28/2020  ? Asymptomatic varicose veins of lower extremity, bilateral 08/28/2020  ? Hospital discharge follow-up 04/05/2020  ? Angina of effort ([place]) 03/26/2020  ? Chest pain 03/25/2020  ? Accelerated hypertension 03/25/2020  ? Atypical chest pain 03/13/2020  ? Elevated AST (SGOT) 10/11/2019  ? Osteoporosis, postmenopausal 08/07/2019  ? Chronic pain in right shoulder 12/06/2018  ? Primary osteoarthritis of both hands 06/05/2018  ? Pancreatic cyst 04/10/2018  ? Myalgia due to statin 04/10/2018  ? Raynaud's phenomenon without gangrene 04/04/2018  ? Elevated rheumatoid factor 04/04/2018  ? Positive ANA (antinuclear antibody) 04/04/2018  ? Varicose veins of leg with pain, right 09/13/2017  ? History of lumbar laminectomy for spinal cord decompression 07/19/2017  ? DNAR (do not attempt resuscitation) 04/19/2017  ? Compression fracture of L4 lumbar vertebra 12/10/2016  ? Celiac artery stenosis ([place]) 12/06/2016  ? GERD (gastroesophageal reflux disease) 04/09/2016  ? Hyperglycemia 12/19/2015  ? Chronic thoracic spine pain 08/15/2015  ? Compression fracture of L2 lumbar vertebra ([place]) 08/15/2015  ? Hyperlipidemia   ? Rheumatoid arthritis ([place])   ? Atherosclerosis of abdominal aorta ([place])   ? Lumbar spinal stenosis   ? CAD (coronary artery disease) 06/24/2014  ? History of adenomatous polyp of colon 06/14/2014  ? Amaurosis fugax of left eye 01/29/2013  ? Pulmonary nodule seen on imaging study 07/13/2012  ? Irritable bowel syndrome 02/16/2012  ? ? ?  Past Surgical History:  ?Procedure Laterality Date  ? BREAST SURGERY Left 1965  ? Benign biopsy  ? CHOLECYSTECTOMY  2005  ? [place]  ? COLONOSCOPY  2008  ? Dr. Donnella Sham  ? EXCISION OF BREAST BIOPSY Left 1965  ? benign  ? EYE SURGERY Bilateral   ? Cataract Extraction with IOL  ? KYPHOPLASTY N/A 12/17/2016  ? Procedure: KYPHOPLASTY;  Surgeon: Hessie Knows, MD;  Location: [facility] ORS;  Service: Orthopedics;  Laterality: N/A;  l4  ? LUMBAR  LAMINECTOMY  05/09/2017  ? L2-L5  ? VISCERAL ANGIOGRAPHY N/A 08/15/2017  ? Procedure: VISCERAL ANGIOGRAPHY;  Surgeon: Algernon Huxley, MD;  Location: [place] [facility];  Service: Cardiovascular;  Laterality: N/A;  ? VISCERAL ANGIOGRAPHY N/A 10/05/2017  ? Procedure: VISCERAL ANGIOGRAPHY;  Surgeon: Algernon Huxley, MD;  Location: [place] [facility];  Service: Cardiovascular;  Laterality: N/A;  ? VISCERAL ARTERY INTERVENTION N/A 08/15/2017  ? Procedure: VISCERAL ARTERY INTERVENTION;  Surgeon: Algernon Huxley, MD;  Location: [place] [facility];  Service: Cardiovascular;  Laterality: N/A;  ? ? ?OB History   ? ? Gravida  ?3  ? Para  ?3  ? Term  ?   ? Preterm  ?   ? AB  ?   ? Living  ?   ?  ? ? SAB  ?   ? IAB  ?   ? Ectopic  ?   ? Multiple  ?   ? Live Births  ?   ?   ?  ? Obstetric Comments  ?1st Menstrual Cycle 13  ?1st Pregnancy:  21  ?  ? ?  ? ? ? ?Home Medications   ? ?Prior to Admission medications   ?Medication Sig Start Date End Date Taking? Authorizing Provider  ?hydrALAZINE (APRESOLINE) 10 MG tablet as needed. 12/14/21  Yes [provider]  ?sulfamethoxazole-trimethoprim (BACTRIM DS) 800-160 MG tablet Take 1 tablet by mouth 2 (two) times daily for 7 days. 02/20/22 02/27/22 Yes Scot Jun, FNP  ?acetaminophen (TYLENOL) 500 MG tablet Take 500 mg by mouth every 6 (six) hours as needed for mild pain.    [provider]  ?Alfalfa 500 MG TABS Take 500 mg by mouth daily.    [provider]  ?Ascorbic Acid (VITAMIN C CR) 500 MG CPCR Take 500 mg by mouth daily.    [provider]  ?Calcium Carbonate (CALCIUM 600 PO) Take by mouth.    [provider]  ?Cholecalciferol (VITAMIN D3) 3000 UNITS TABS Take 3,000 Units by mouth 2 (two) times daily.     [provider]  ?CINNAMON PO Take 1,000 mg by mouth daily.    [provider]  ?clotrimazole-betamethasone (LOTRISONE) cream Apply topically 2 (two) times daily. 03/14/19   Poulose, Bethel Born, NP  ?diclofenac sodium  (VOLTAREN) 1 % GEL Apply topically as needed.  08/02/19   [provider]  ?fluticasone (FLONASE) 50 MCG/ACT nasal spray Place 1 spray into both nostrils daily.    [provider]  ?Ginkgo Biloba (GNP GINGKO BILOBA EXTRACT PO) Take 1 tablet by mouth daily.    [provider]  ?Glucosamine Sulfate 500 MG CAPS Take 500 mg by mouth daily.    [provider]  ?isosorbide mononitrate (IMDUR) 30 MG 24 hr tablet Take 1 tablet (30 mg total) by mouth daily. 01/11/22   Kate Sable, MD  ?Lactobacillus (PROBIOTIC ACIDOPHILUS PO) Take 1 capsule by mouth daily.     [provider]  ?loratadine (  CLARITIN) 10 MG tablet Take 10 mg by mouth daily as needed for allergies.    [provider]  ?Magnesium 400 MG CAPS Take 400 mg by mouth daily.    [provider]  ?metoprolol succinate (TOPROL XL) 25 MG 24 hr tablet Take 0.5 tablets (12.5 mg total) by mouth daily. 12/08/21 12/08/22  Kate Sable, MD  ?Multiple Vitamin (MULTIVITAMIN) capsule Take 1 capsule by mouth daily.    [provider]  ?mupirocin ointment (BACTROBAN) 2 % Apply to right leg wound daily - a small dab as needed once a day. 08/26/20   Marval Regal, NP  ?nitroGLYCERIN (NITROSTAT) 0.4 MG SL tablet DISSOLVE ONE TABLET UNDER THE TONGUE EVERY 5 MINUTES AS NEEDED FOR CHEST PAIN.  DO NOT EXCEED A TOTAL OF 3 DOSES IN 15 MINUTES 12/14/21   Crecencio Mc, MD  ?Omega-3 Fatty Acids (OMEGA 3 PO) Take 520 mg by mouth daily.     [provider]  ?Pramoxine-HC (HYDROCORTISONE ACE-PRAMOXINE) 2.5-1 % CREA Apply topically as needed.     [provider]  ?vitamin B-12 (CYANOCOBALAMIN) 1000 MCG tablet Take 1,000 mcg by mouth daily.    [provider]  ? ? ?Family History ?Family History  ?Problem Relation Age of Onset  ? Heart disease Mother   ? Hypertension Father   ? Heart disease Brother 38  ?     heart attack  ? Cancer Brother   ?     bladder  ? Diabetes Sister   ? Alcohol  abuse Sister   ? Kidney disease Sister   ? Diabetes Brother   ? Stroke Brother   ? Diabetes Maternal Grandmother   ? Heart disease Maternal Grandmother   ? Cancer Brother   ?     lung  ? ? ?Social History ?Social

## 2022-02-21 ENCOUNTER — Ambulatory Visit: Payer: Self-pay

## 2022-02-23 DIAGNOSIS — Z20822 Contact with and (suspected) exposure to covid-19: Secondary | ICD-10-CM | POA: Diagnosis not present

## 2022-03-01 DIAGNOSIS — M25511 Pain in right shoulder: Secondary | ICD-10-CM | POA: Diagnosis not present

## 2022-03-01 DIAGNOSIS — G8929 Other chronic pain: Secondary | ICD-10-CM | POA: Diagnosis not present

## 2022-03-10 DIAGNOSIS — G8929 Other chronic pain: Secondary | ICD-10-CM | POA: Diagnosis not present

## 2022-03-10 DIAGNOSIS — M25511 Pain in right shoulder: Secondary | ICD-10-CM | POA: Diagnosis not present

## 2022-03-17 DIAGNOSIS — G8929 Other chronic pain: Secondary | ICD-10-CM | POA: Diagnosis not present

## 2022-03-17 DIAGNOSIS — M25511 Pain in right shoulder: Secondary | ICD-10-CM | POA: Diagnosis not present

## 2022-05-03 ENCOUNTER — Emergency Department
Admission: EM | Admit: 2022-05-03 | Discharge: 2022-05-03 | Disposition: A | Discharge status: Home / Self Care | Payer: Medicare Other | Attending: Emergency Medicine | Admitting: Emergency Medicine

## 2022-05-03 DIAGNOSIS — I10 Essential (primary) hypertension: Secondary | ICD-10-CM | POA: Insufficient documentation

## 2022-05-03 DIAGNOSIS — R030 Elevated blood-pressure reading, without diagnosis of hypertension: Secondary | ICD-10-CM | POA: Diagnosis present

## 2022-05-03 DIAGNOSIS — H43811 Vitreous degeneration, right eye: Secondary | ICD-10-CM | POA: Diagnosis not present

## 2022-05-03 DIAGNOSIS — H4921 Sixth [abducent] nerve palsy, right eye: Secondary | ICD-10-CM | POA: Diagnosis not present

## 2022-05-03 LAB — BASIC METABOLIC PANEL
Anion gap: 8 (ref 5–15)
BUN: 14 mg/dL (ref 8–23)
CO2: 24 mmol/L (ref 22–32)
Calcium: 8.9 mg/dL (ref 8.9–10.3)
Chloride: 107 mmol/L (ref 98–111)
Creatinine, Ser: 0.44 mg/dL (ref 0.44–1.00)
GFR, Estimated: >60 mL/min (ref >60)
Glucose, Bld: 97 mg/dL (ref 70–99)
Potassium: 3.7 mmol/L (ref 3.5–5.1)
Sodium: 139 mmol/L (ref 135–145)

## 2022-05-03 LAB — CBC WITH DIFFERENTIAL/PLATELET
Abs Immature Granulocytes: 0.01 10*3/uL (ref 0.00–0.07)
Basophils Absolute: 0 10*3/uL (ref 0.0–0.1)
Basophils Relative: 1 %
Eosinophils Absolute: 0.1 10*3/uL (ref 0.0–0.5)
Eosinophils Relative: 1 %
HCT: 44.2 % (ref 36.0–46.0)
Hemoglobin: 14.1 g/dL (ref 12.0–15.0)
Immature Granulocytes: 0 %
Lymphocytes Relative: 37 %
Lymphs Abs: 1.7 10*3/uL (ref 0.7–4.0)
MCH: 29.3 pg (ref 26.0–34.0)
MCHC: 31.9 g/dL (ref 30.0–36.0)
MCV: 91.9 fL (ref 80.0–100.0)
Monocytes Absolute: 0.5 10*3/uL (ref 0.1–1.0)
Monocytes Relative: 10 %
Neutro Abs: 2.3 10*3/uL (ref 1.7–7.7)
Neutrophils Relative %: 51 %
Platelets: 189 10*3/uL (ref 150–400)
RBC: 4.81 MIL/uL (ref 3.87–5.11)
RDW: 13.2 % (ref 11.5–15.5)
WBC: 4.6 10*3/uL (ref 4.0–10.5)
nRBC: 0 % (ref 0.0–0.2)

## 2022-05-03 LAB — TROPONIN I (HIGH SENSITIVITY): Troponin I (High Sensitivity): 18 ng/L — ABNORMAL HIGH (ref <18)

## 2022-05-03 MED ORDER — HYDRALAZINE HCL 20 MG/ML IJ SOLN
10.0000 mg | Freq: Once | INTRAMUSCULAR | Status: AC
  Administered 2022-05-03: 10 mg via INTRAVENOUS
  Filled 2022-05-03: qty 1

## 2022-05-03 NOTE — ED Notes (Signed)
Pt Dc to home. Dc instructions reviewed with all questions answered. Pt verbalizes understanding. IV removed. Cath intact. Pressure dressing applied. No bleeding noted at site. Pt ambulatory out of dept with steady gait with family members.

## 2022-05-03 NOTE — ED Triage Notes (Signed)
Pt presents via POV c/o HTN. Reports BP at home 210 systolic., Reports unresolved with PO Hydralazine. A&O x4.

## 2022-05-03 NOTE — ED Provider Notes (Signed)
[facility] Provider Note    Event Date/Time   First MD Initiated Contact with Patient 05/03/22 2031     (approximate)   History   Hypertension   HPI  [name] is a 88 y.o. female who presents to the emergency department today because of concerns for elevated blood pressure.  Patient states she does have a history of high blood pressure.  She has noticed over the past couple days that she was not feeling quite right.  Because of this she checked her blood pressure yesterday and noticed it was high.  It was again high today.  The patient does have hydralazine which she can take as needed for high blood pressure.  She states she tried taking it without any significant relief of her symptoms or her blood pressure.      Physical Exam   Triage Vital Signs: ED Triage Vitals  Enc Vitals Group     BP 05/03/22 1950 (!) 194/92     Pulse Rate 05/03/22 1950 62     Resp 05/03/22 1950 16     Temp 05/03/22 1950 97.8 F (36.6 C)     Temp Source 05/03/22 1950 Oral     SpO2 05/03/22 1950 97 %     Weight 05/03/22 1951 100 lb (45.4 kg)     Height --      Head Circumference --      Peak Flow --      Pain Score 05/03/22 1950 0     Pain Loc --      Pain Edu? --      Excl. in GC? --     Most recent vital signs: Vitals:   05/03/22 1950  BP: (!) 194/92  Pulse: 62  Resp: 16  Temp: 97.8 F (36.6 C)  SpO2: 97%   General: Awake, alert, oriented. CV:  Good peripheral perfusion. Regular rate and rhythm. Resp:  Normal effort. Lungs clear. Abd:  No distention.    ED Results / Procedures / Treatments   Labs (all labs ordered are listed, but only abnormal results are displayed) Labs Reviewed  TROPONIN I (HIGH SENSITIVITY) - Abnormal; Notable for the following components:      Result Value   Troponin I (High Sensitivity) 18 (*)    All other components within normal limits  CBC WITH DIFFERENTIAL/PLATELET  BASIC METABOLIC PANEL  TROPONIN I (HIGH  SENSITIVITY)     EKG  I, Phineas Semen, attending physician, personally viewed and interpreted this EKG  EKG Time: 2132 Rate: 63 Rhythm: sinus rhythm Axis: normal Intervals: qtc 470 QRS: narrow, q waves v1, v2 ST changes: no st elevation Impression: abnormal ekg  RADIOLOGY None  PROCEDURES:  Critical Care performed: No  Procedures   MEDICATIONS ORDERED IN ED: Medications - No data to display   IMPRESSION / MDM / ASSESSMENT AND PLAN / ED COURSE  I reviewed the triage vital signs and the nursing notes.                              Differential diagnosis includes, but is not limited to, hypertension, acs.  Patient's presentation is most consistent with acute presentation with potential threat to life or bodily function.  Patient presents to the emergency department today because of concern for high blood pressure.  Initial blood pressure was quite elevated here in the emergency department.  Blood work without any concerning leukocytosis anemia or electrolyte  abnormality.  Troponin was 18.  I did discuss this with the patient.  She has had elevated troponins in the past.  At this time I have low suspicion for true ACS and patient felt comfortable deferring further testing.  She did feel better after hydralazine and blood pressure improvement.  Discussed importance of following up with primary care.  FINAL CLINICAL IMPRESSION(S) / ED DIAGNOSES   Final diagnoses:  Hypertension, unspecified type     Note:  This document was prepared using Dragon voice recognition software and may include unintentional dictation errors.    Phineas Semen, MD 05/03/22 [phone]

## 2022-05-04 ENCOUNTER — Emergency Department: Payer: Medicare Other

## 2022-05-04 ENCOUNTER — Observation Stay
Admission: EM | Admit: 2022-05-04 | Discharge: 2022-05-05 | Disposition: A | Discharge status: Home / Self Care | Payer: Medicare Other | Attending: Internal Medicine | Admitting: Internal Medicine

## 2022-05-04 ENCOUNTER — Other Ambulatory Visit: Payer: Self-pay

## 2022-05-04 ENCOUNTER — Encounter: Payer: Self-pay | Admitting: Emergency Medicine

## 2022-05-04 DIAGNOSIS — N182 Chronic kidney disease, stage 2 (mild): Secondary | ICD-10-CM | POA: Insufficient documentation

## 2022-05-04 DIAGNOSIS — J9809 Other diseases of bronchus, not elsewhere classified: Secondary | ICD-10-CM | POA: Diagnosis not present

## 2022-05-04 DIAGNOSIS — R0789 Other chest pain: Secondary | ICD-10-CM | POA: Diagnosis not present

## 2022-05-04 DIAGNOSIS — I6381 Other cerebral infarction due to occlusion or stenosis of small artery: Secondary | ICD-10-CM | POA: Diagnosis not present

## 2022-05-04 DIAGNOSIS — R918 Other nonspecific abnormal finding of lung field: Secondary | ICD-10-CM | POA: Diagnosis not present

## 2022-05-04 DIAGNOSIS — K5730 Diverticulosis of large intestine without perforation or abscess without bleeding: Secondary | ICD-10-CM | POA: Diagnosis not present

## 2022-05-04 DIAGNOSIS — R079 Chest pain, unspecified: Secondary | ICD-10-CM | POA: Diagnosis not present

## 2022-05-04 DIAGNOSIS — I2510 Atherosclerotic heart disease of native coronary artery without angina pectoris: Secondary | ICD-10-CM | POA: Diagnosis not present

## 2022-05-04 DIAGNOSIS — I160 Hypertensive urgency: Secondary | ICD-10-CM

## 2022-05-04 DIAGNOSIS — S22080A Wedge compression fracture of T11-T12 vertebra, initial encounter for closed fracture: Secondary | ICD-10-CM | POA: Diagnosis not present

## 2022-05-04 DIAGNOSIS — I358 Other nonrheumatic aortic valve disorders: Secondary | ICD-10-CM | POA: Diagnosis not present

## 2022-05-04 DIAGNOSIS — Z79899 Other long term (current) drug therapy: Secondary | ICD-10-CM | POA: Insufficient documentation

## 2022-05-04 DIAGNOSIS — N858 Other specified noninflammatory disorders of uterus: Secondary | ICD-10-CM | POA: Diagnosis not present

## 2022-05-04 DIAGNOSIS — Z955 Presence of coronary angioplasty implant and graft: Secondary | ICD-10-CM | POA: Diagnosis not present

## 2022-05-04 HISTORY — DX: Hypertensive urgency: I16.0

## 2022-05-04 LAB — LIPASE, BLOOD: Lipase: 56 U/L — ABNORMAL HIGH (ref 11–51)

## 2022-05-04 LAB — TROPONIN I (HIGH SENSITIVITY)
Troponin I (High Sensitivity): 21 ng/L — ABNORMAL HIGH (ref <18)
Troponin I (High Sensitivity): 16 ng/L (ref <18)

## 2022-05-04 LAB — BASIC METABOLIC PANEL
Anion gap: 9 (ref 5–15)
BUN: 13 mg/dL (ref 8–23)
CO2: 26 mmol/L (ref 22–32)
Calcium: 9.6 mg/dL (ref 8.9–10.3)
Chloride: 104 mmol/L (ref 98–111)
Creatinine, Ser: 0.47 mg/dL (ref 0.44–1.00)
GFR, Estimated: >60 mL/min (ref >60)
Glucose, Bld: 168 mg/dL — ABNORMAL HIGH (ref 70–99)
Potassium: 3.9 mmol/L (ref 3.5–5.1)
Sodium: 139 mmol/L (ref 135–145)

## 2022-05-04 LAB — HEPATIC FUNCTION PANEL
ALT: 24 U/L (ref 0–44)
AST: 44 U/L — ABNORMAL HIGH (ref 15–41)
Albumin: 4.5 g/dL (ref 3.5–5.0)
Alkaline Phosphatase: 53 U/L (ref 38–126)
Bilirubin, Direct: <0.1 mg/dL (ref 0.0–0.2)
Total Bilirubin: 0.4 mg/dL (ref 0.3–1.2)
Total Protein: 7.8 g/dL (ref 6.5–8.1)

## 2022-05-04 LAB — CBC
HCT: 50.2 % — ABNORMAL HIGH (ref 36.0–46.0)
Hemoglobin: 16.3 g/dL — ABNORMAL HIGH (ref 12.0–15.0)
MCH: 29.4 pg (ref 26.0–34.0)
MCHC: 32.5 g/dL (ref 30.0–36.0)
MCV: 90.5 fL (ref 80.0–100.0)
Platelets: 199 10*3/uL (ref 150–400)
RBC: 5.55 MIL/uL — ABNORMAL HIGH (ref 3.87–5.11)
RDW: 13.3 % (ref 11.5–15.5)
WBC: 6.6 10*3/uL (ref 4.0–10.5)
nRBC: 0 % (ref 0.0–0.2)

## 2022-05-04 MED ORDER — LABETALOL HCL 5 MG/ML IV SOLN: 10.0000 mg | Freq: Four times a day (QID) | INTRAVENOUS | Status: DC | PRN

## 2022-05-04 MED ORDER — LABETALOL HCL 5 MG/ML IV SOLN
5.0000 mg | Freq: Once | INTRAVENOUS | Status: AC
  Administered 2022-05-04: 5 mg via INTRAVENOUS
  Filled 2022-05-04: qty 4

## 2022-05-04 MED ORDER — SODIUM CHLORIDE 0.9% FLUSH: 3.0000 mL | INTRAVENOUS | Status: DC | PRN

## 2022-05-04 MED ORDER — METOPROLOL SUCCINATE ER 25 MG PO TB24
12.5000 mg | ORAL_TABLET | Freq: Every day | ORAL | Status: DC
  Administered 2022-05-04 – 2022-05-05 (×2): 12.5 mg via ORAL
  Filled 2022-05-04: qty 0.5
  Filled 2022-05-04: qty 1

## 2022-05-04 MED ORDER — HYDRALAZINE HCL 20 MG/ML IJ SOLN
10.0000 mg | Freq: Four times a day (QID) | INTRAMUSCULAR | Status: DC | PRN
  Administered 2022-05-04: 10 mg via INTRAVENOUS
  Filled 2022-05-04: qty 1

## 2022-05-04 MED ORDER — FLUTICASONE PROPIONATE 50 MCG/ACT NA SUSP
1.0000 | Freq: Every day | NASAL | Status: DC
Start: 2022-05-05 — End: 2022-05-04
  Filled 2022-05-04: qty 16

## 2022-05-04 MED ORDER — ADULT MULTIVITAMIN W/MINERALS CH
1.0000 | ORAL_TABLET | Freq: Every day | ORAL | Status: DC
  Administered 2022-05-04 – 2022-05-05 (×2): 1 via ORAL
  Filled 2022-05-04 (×2): qty 1

## 2022-05-04 MED ORDER — ONDANSETRON HCL 4 MG PO TABS: 4.0000 mg | ORAL_TABLET | Freq: Four times a day (QID) | ORAL | Status: DC | PRN

## 2022-05-04 MED ORDER — HYDRALAZINE HCL 20 MG/ML IJ SOLN: 5.0000 mg | Freq: Once | INTRAMUSCULAR | Status: DC

## 2022-05-04 MED ORDER — NITROGLYCERIN 0.4 MG SL SUBL: 0.4000 mg | SUBLINGUAL_TABLET | SUBLINGUAL | Status: DC | PRN

## 2022-05-04 MED ORDER — VITAMIN D3 25 MCG (1000 UNIT) PO TABS
3000.0000 [IU] | ORAL_TABLET | Freq: Two times a day (BID) | ORAL | Status: DC
  Administered 2022-05-04 – 2022-05-05 (×2): 3000 [IU] via ORAL
  Filled 2022-05-04 (×4): qty 3

## 2022-05-04 MED ORDER — IOHEXOL 350 MG/ML SOLN
75.0000 mL | Freq: Once | INTRAVENOUS | Status: AC | PRN
  Administered 2022-05-04: 75 mL via INTRAVENOUS

## 2022-05-04 MED ORDER — SODIUM CHLORIDE 0.9 % IV SOLN: 250.0000 mL | INTRAVENOUS | Status: DC | PRN

## 2022-05-04 MED ORDER — SODIUM CHLORIDE 0.9% FLUSH
3.0000 mL | Freq: Two times a day (BID) | INTRAVENOUS | Status: DC
Start: 2022-05-04 — End: 2022-05-05
  Administered 2022-05-04 – 2022-05-05 (×3): 3 mL via INTRAVENOUS

## 2022-05-04 MED ORDER — ENOXAPARIN SODIUM 40 MG/0.4ML IJ SOSY
40.0000 mg | PREFILLED_SYRINGE | INTRAMUSCULAR | Status: DC
  Administered 2022-05-04 – 2022-05-05 (×2): 40 mg via SUBCUTANEOUS
  Filled 2022-05-04 (×2): qty 0.4

## 2022-05-04 MED ORDER — ACETAMINOPHEN 500 MG PO TABS: 500.0000 mg | ORAL_TABLET | Freq: Four times a day (QID) | ORAL | Status: DC | PRN

## 2022-05-04 MED ORDER — ONDANSETRON HCL 4 MG/2ML IJ SOLN: 4.0000 mg | Freq: Four times a day (QID) | INTRAMUSCULAR | Status: DC | PRN

## 2022-05-04 MED ORDER — ISOSORBIDE MONONITRATE ER 30 MG PO TB24
30.0000 mg | ORAL_TABLET | Freq: Every day | ORAL | Status: DC
  Administered 2022-05-04 – 2022-05-05 (×2): 30 mg via ORAL
  Filled 2022-05-04 (×2): qty 1

## 2022-05-04 MED ORDER — OMEGA 3 1000 MG PO CAPS: ORAL_CAPSULE | Freq: Every day | ORAL | Status: DC

## 2022-05-04 MED ORDER — SULFAMETHOXAZOLE-TRIMETHOPRIM 800-160 MG PO TABS
1.0000 | ORAL_TABLET | Freq: Two times a day (BID) | ORAL | Status: DC
  Administered 2022-05-04 – 2022-05-05 (×3): 1 via ORAL
  Filled 2022-05-04 (×3): qty 1

## 2022-05-04 MED ORDER — OMEGA-3-ACID ETHYL ESTERS 1 G PO CAPS
1.0000 g | ORAL_CAPSULE | Freq: Every day | ORAL | Status: DC
  Administered 2022-05-05: 1 g via ORAL
  Filled 2022-05-04: qty 1

## 2022-05-04 MED ORDER — FLUTICASONE PROPIONATE 50 MCG/ACT NA SUSP: 1.0000 | Freq: Every day | NASAL | Status: DC | PRN

## 2022-05-04 NOTE — Assessment & Plan Note (Addendum)
--  Improved --Continue Imdur and metoprolol --prn hydralazine --bp much better

## 2022-05-04 NOTE — Assessment & Plan Note (Addendum)
--  Patient has a history of coronary artery disease status post stent angioplasty and presented to the ER for evaluation of chest pain which has since resolved. Serial troponin is negative Continue metoprolol and Imdur Follow-up with cardiology as an outpatient --pt is CP free

## 2022-05-04 NOTE — Assessment & Plan Note (Addendum)
--  Patient has a chest x-ray which shows interval development of a 2.6 cm right upper lobe opacity, possibly representing a primary pulmonary mass. --CT angiogram of the chest shows a chronically obstructed bronchus in the right upper lobe with postobstructive changes in this region indicating of pneumonia/pneumonitis.  Associated with the most central aspect of the obstructed bronchus is a small nodule concerning for neoplasm. -- consult pulmonology Dr aleskerov--labs sent out. Pt will f/u as outpt- Empiric bactrim started by Dr Loni Muse

## 2022-05-04 NOTE — ED Notes (Signed)
2 visitors remain with pt. Pt updated about room assignment. Pt in NAD.

## 2022-05-05 DIAGNOSIS — R918 Other nonspecific abnormal finding of lung field: Secondary | ICD-10-CM | POA: Diagnosis not present

## 2022-05-05 DIAGNOSIS — I160 Hypertensive urgency: Secondary | ICD-10-CM | POA: Diagnosis not present

## 2022-05-05 LAB — CBC
HCT: 44.5 % (ref 36.0–46.0)
Hemoglobin: 14.7 g/dL (ref 12.0–15.0)
MCH: 29.6 pg (ref 26.0–34.0)
MCHC: 33 g/dL (ref 30.0–36.0)
MCV: 89.7 fL (ref 80.0–100.0)
Platelets: 178 10*3/uL (ref 150–400)
RBC: 4.96 MIL/uL (ref 3.87–5.11)
RDW: 13.7 % (ref 11.5–15.5)
WBC: 5.6 10*3/uL (ref 4.0–10.5)
nRBC: 0 % (ref 0.0–0.2)

## 2022-05-05 LAB — CRYPTOCOCCUS ANTIGEN, SERUM: Cryptococcus Antigen, Serum: NEGATIVE

## 2022-05-05 LAB — BASIC METABOLIC PANEL
Anion gap: 9 (ref 5–15)
BUN: 15 mg/dL (ref 8–23)
CO2: 25 mmol/L (ref 22–32)
Calcium: 9.1 mg/dL (ref 8.9–10.3)
Chloride: 105 mmol/L (ref 98–111)
Creatinine, Ser: 0.72 mg/dL (ref 0.44–1.00)
GFR, Estimated: >60 mL/min (ref >60)
Glucose, Bld: 95 mg/dL (ref 70–99)
Potassium: 4.4 mmol/L (ref 3.5–5.1)
Sodium: 139 mmol/L (ref 135–145)

## 2022-05-05 MED ORDER — SULFAMETHOXAZOLE-TRIMETHOPRIM 800-160 MG PO TABS: 1.0000 | ORAL_TABLET | Freq: Two times a day (BID) | ORAL | 0 refills | Status: AC

## 2022-05-05 NOTE — TOC Transition Note (Signed)
Transition of Care [facility]) - CM/SW Discharge Note   Patient Details  Name: [name] MRN: [id number] Date of Birth: 1927-07-03  [facility] CM/SW Contact:  Donnelly Angelica, LCSW Phone Number: 05/05/2022, 1:05 PM   Clinical Narrative:    Pt has orders to discharge today. No further concerns. CSW signing off.    Final next level of care: Home/Self Care Barriers to Discharge: Barriers Resolved   Patient Goals and CMS Choice        Discharge Placement                    Patient and family notified of of transfer: 05/05/22  Discharge Plan and Services                                     Social Determinants of Health (SDOH) Interventions     Readmission Risk Interventions     No data to display

## 2022-05-05 NOTE — Progress Notes (Signed)
PULMONOLOGY         Date: 05/05/2022,   MRN# [id number] Shandora Koogler 1927/10/28     AdmissionWeight: 45.4 kg                 CurrentWeight: 45.4 kg  Referring provider: Dr Francine Graven   CHIEF COMPLAINT:   Dyspnea on exertion with presyncope and chest pain   HISTORY OF PRESENT ILLNESS   This is a 88 year old female with a history of CAD status post PCI, rheumatoid arthritis, CKD 2 GERD who came in for accelerated hypertension.  In the ER patient was treated medically and subsequently discharged home however returned same day due to left-sided chest pain and dizziness.  Incidentally she was found to have a 2.6 cm right upper lobe opacity possible lung mass as well as postobstructive pneumonia of the right upper lobe pulmonary consultation placed for additional evaluation management.  05/05/22- patient is doing well and is cleared for dc home from pulmonary standpoint.  Plan for outpatient follow up and additional testing including PET scan and lung biopsy.  Her Accelerated HTN seems to be under much better control now. She is asymptomatic during my eval. Reviewed with family today at bedside.   PAST MEDICAL HISTORY   Past Medical History:  Diagnosis Date   Allergy    Arthritis    Atherosclerosis    Atherosclerosis of abdominal aorta (HCC)    Chronic kidney disease    stage 2   CKD (chronic kidney disease) stage 2, GFR 60-89 ml/min    Collagen vascular disease ([place])    Compression fracture of thoracic spine, non-traumatic ([place]) 08/19/2015   Seen on plain films October 2016   Diverticulosis 2008   GERD (gastroesophageal reflux disease)    Heart murmur    Hyperlipidemia    Lumbar spinal stenosis    Osteoporosis    Ovarian lump    RA (rheumatoid arthritis) ([place])    Raynaud's disease    Rheumatoid arthritis ([place])    Spinal stenosis      SURGICAL HISTORY   Past Surgical History:  Procedure Laterality Date   BREAST SURGERY Left 1965   Benign biopsy    CHOLECYSTECTOMY  2005   [place]   COLONOSCOPY  2008   Dr. Donnella Sham   EXCISION OF BREAST BIOPSY Left 1965   benign   EYE SURGERY Bilateral    Cataract Extraction with IOL   KYPHOPLASTY N/A 12/17/2016   Procedure: KYPHOPLASTY;  Surgeon: Hessie Knows, MD;  Location: [facility] ORS;  Service: Orthopedics;  Laterality: N/A;  l4   LUMBAR LAMINECTOMY  05/09/2017   L2-L5   VISCERAL ANGIOGRAPHY N/A 08/15/2017   Procedure: VISCERAL ANGIOGRAPHY;  Surgeon: Algernon Huxley, MD;  Location: [place] [facility];  Service: Cardiovascular;  Laterality: N/A;   VISCERAL ANGIOGRAPHY N/A 10/05/2017   Procedure: VISCERAL ANGIOGRAPHY;  Surgeon: Algernon Huxley, MD;  Location: [place] [facility];  Service: Cardiovascular;  Laterality: N/A;   VISCERAL ARTERY INTERVENTION N/A 08/15/2017   Procedure: VISCERAL ARTERY INTERVENTION;  Surgeon: Algernon Huxley, MD;  Location: [place] [facility];  Service: Cardiovascular;  Laterality: N/A;     FAMILY HISTORY   Family History  Problem Relation Age of Onset   Heart disease Mother    Hypertension Father    Heart disease Brother 78       heart attack   Cancer Brother        bladder   Diabetes Sister    Alcohol abuse  Sister    Kidney disease Sister    Diabetes Brother    Stroke Brother    Diabetes Maternal Grandmother    Heart disease Maternal Grandmother    Cancer Brother        lung     SOCIAL HISTORY   Social History   Tobacco Use   Smoking status: Never   Smokeless tobacco: Never   Tobacco comments:    smoking cessation materials not required  Vaping Use   Vaping Use: Never used  Substance Use Topics   Alcohol use: No    Alcohol/week: 0.0 standard drinks of alcohol   Drug use: No     MEDICATIONS    Home Medication:    Current Medication:  Current Facility-Administered Medications:    0.9 %  sodium chloride infusion, 250 mL, Intravenous, PRN, Agbata, Tochukwu, MD   acetaminophen (TYLENOL) tablet 500 mg, 500 mg, Oral, Q6H PRN, Agbata, Tochukwu,  MD   cholecalciferol (VITAMIN D) tablet 3,000 Units, 3,000 Units, Oral, BID, Agbata, Tochukwu, MD, 3,000 Units at 05/04/22 2104   enoxaparin (LOVENOX) injection 40 mg, 40 mg, Subcutaneous, Q24H, Agbata, Tochukwu, MD, 40 mg at 05/04/22 1252   fluticasone (FLONASE) 50 MCG/ACT nasal spray 1 spray, 1 spray, Each Nare, Daily PRN, Agbata, Tochukwu, MD   hydrALAZINE (APRESOLINE) injection 10 mg, 10 mg, Intravenous, Q6H PRN, Agbata, Tochukwu, MD, 10 mg at 05/04/22 1251   isosorbide mononitrate (IMDUR) 24 hr tablet 30 mg, 30 mg, Oral, Daily, Agbata, Tochukwu, MD, 30 mg at 05/04/22 1041   metoprolol succinate (TOPROL-XL) 24 hr tablet 12.5 mg, 12.5 mg, Oral, Daily, Agbata, Tochukwu, MD, 12.5 mg at 05/04/22 1040   multivitamin with minerals tablet 1 tablet, 1 tablet, Oral, Daily, Agbata, Tochukwu, MD, 1 tablet at 05/04/22 1040   nitroGLYCERIN (NITROSTAT) SL tablet 0.4 mg, 0.4 mg, Sublingual, Q5 min PRN, Agbata, Tochukwu, MD   omega-3 acid ethyl esters (LOVAZA) capsule 1 g, 1 g, Oral, Daily, Merrill, Kristin A, RPH   ondansetron (ZOFRAN) tablet 4 mg, 4 mg, Oral, Q6H PRN **OR** ondansetron (ZOFRAN) injection 4 mg, 4 mg, Intravenous, Q6H PRN, Agbata, Tochukwu, MD   sodium chloride flush (NS) 0.9 % injection 3 mL, 3 mL, Intravenous, Q12H, Agbata, Tochukwu, MD, 3 mL at 05/04/22 2104   sodium chloride flush (NS) 0.9 % injection 3 mL, 3 mL, Intravenous, PRN, Agbata, Tochukwu, MD   sulfamethoxazole-trimethoprim (BACTRIM DS) 800-160 MG per tablet 1 tablet, 1 tablet, Oral, Q12H, Ottie Glazier, MD, 1 tablet at 05/04/22 2104    ALLERGIES   Ibandronic acid, Other, Prednisone, Actonel [risedronate], Amoxicillin, Pantoprazole, Raloxifene, Versed [midazolam], and Doxycycline     REVIEW OF SYSTEMS    Review of Systems:  Gen:  Denies  fever, sweats, chills weigh loss  HEENT: Denies blurred vision, double vision, ear pain, eye pain, hearing loss, nose bleeds, sore throat Cardiac:  No dizziness, chest pain or  heaviness, chest tightness,edema Resp:   reports dyspnea chronically  Gi: Denies swallowing difficulty, stomach pain, nausea or vomiting, diarrhea, constipation, bowel incontinence Gu:  Denies bladder incontinence, burning urine Ext:   Denies Joint pain, stiffness or swelling Skin: Denies  skin rash, easy bruising or bleeding or hives Endoc:  Denies polyuria, polydipsia , polyphagia or weight change Psych:   Denies depression, insomnia or hallucinations   Other:  All other systems negative   VS: BP (!) 150/77 (BP Location: Right Arm)   Pulse (!) 58   Temp 97.8 F (36.6 C)   Resp 16  Ht 5' (1.524 m)   Wt 45.4 kg   SpO2 94%   BMI 19.55 kg/m      PHYSICAL EXAM    GENERAL:NAD, no fevers, chills, no weakness no fatigue HEAD: Normocephalic, atraumatic.  EYES: Pupils equal, round, reactive to light. Extraocular muscles intact. No scleral icterus.  MOUTH: Moist mucosal membrane. Dentition intact. No abscess noted.  EAR, NOSE, THROAT: Clear without exudates. No external lesions.  NECK: Supple. No thyromegaly. No nodules. No JVD.  PULMONARY: decreased breath sounds with mild rhonchi worse at bases bilaterally.  CARDIOVASCULAR: S1 and S2. Regular rate and rhythm. No murmurs, rubs, or gallops. No edema. Pedal pulses 2+ bilaterally.  GASTROINTESTINAL: Soft, nontender, nondistended. No masses. Positive bowel sounds. No hepatosplenomegaly.  MUSCULOSKELETAL: No swelling, clubbing, or edema. Range of motion full in all extremities.  NEUROLOGIC: Cranial nerves II through XII are intact. No gross focal neurological deficits. Sensation intact. Reflexes intact.  SKIN: No ulceration, lesions, rashes, or cyanosis. Skin warm and dry. Turgor intact.  PSYCHIATRIC: Mood, affect within normal limits. The patient is awake, alert and oriented x 3. Insight, judgment intact.       IMAGING     ASSESSMENT/PLAN   Right upper lobe masslike infiltrate -Recommendation for empiric treatment with  antimicrobials for pneumonia -Fungitell for possible mild or fungal involvement -Patient may have methotrexate and susceptible to endemic opportunistic infections due to relative immunosuppression -Obtain Aspergillus and histoplasma work-up -Patient may need bronchoscopic evaluation with airway inspection and lung biopsy, will review goals of care and level of aggressiveness desired by patient and family -There is absence of leukocytosis -MRSA PCR screening -Patient has multiple drug allergies with including several antibiotics we will use Bactrim DS twice daily for now -reviewed possible bronchoscopy with patient  -Reviewed risks/complications and benefits with patient, risks include infection, pneumothorax/pneumomediastinum which may require chest tube placement as well as overnight/prolonged hospitalization and possible mechanical ventilation. Other risks include bleeding and very rarely death.  Patient understands risks and wishes to proceed.  Additional questions were answered, and patient is aware that post procedure patient will be going home with family and may experience cough with possible clots on expectoration as well as phlegm which may last few days as well as hoarseness of voice post intubation and mechanical ventilation.             Thank you for allowing me to participate in the care of this patient.   Patient/Family are satisfied with care plan and all questions have been answered.    Provider disclosure: Patient with at least one acute or chronic illness or injury that poses a threat to life or bodily function and is being managed actively during this encounter.  All of the below services have been performed independently by signing provider:  review of prior documentation from internal and or external health records.  Review of previous and current lab results.  Interview and comprehensive assessment during patient visit today. Review of current and previous chest  radiographs/CT scans. Discussion of management and test interpretation with health care team and patient/family.   This document was prepared using Dragon voice recognition software and may include unintentional dictation errors.     Ottie Glazier, M.D.  Division of Pulmonary & Critical Care Medicine

## 2022-05-05 NOTE — Discharge Summary (Signed)
Physician Discharge Summary   Patient: [name] MRN: [id number] DOB: 1927-05-16  Admit date:     05/04/2022  Discharge date: 05/05/22  Discharge Physician: Fritzi Mandes   PCP: Crecencio Mc, MD   Recommendations at discharge:   F/u Dr Lanney Gins in 1-2 weeks for Lung mass evaluation  Discharge Diagnoses: Hypertensive Urgency--resolved Lung mass--w/u as out pt with Pulmonary  Hospital Course:  * Hypertensive urgency --Improved --Continue Imdur and metoprolol --prn hydralazine --bp much better  Atypical chest pain --Patient has a history of coronary artery disease status post stent angioplasty and presented to the ER for evaluation of chest pain which has since resolved. Serial troponin is negative Continue metoprolol and Imdur Follow-up with cardiology as an outpatient --pt is CP free  CAD (coronary artery disease) Treatment as outlined in 2  Lung mass --Patient has a chest x-ray which shows interval development of a 2.6 cm right upper lobe opacity, possibly representing a primary pulmonary mass. --CT angiogram of the chest shows a chronically obstructed bronchus in the right upper lobe with postobstructive changes in this region indicating of pneumonia/pneumonitis.  Associated with the most central aspect of the obstructed bronchus is a small nodule concerning for neoplasm. -- consult pulmonology Dr aleskerov--labs sent out. Pt will f/u as outpt- Empiric bactrim started by Dr A   Overall stable D/c home. Pt and dter agreeable       Consultants: Dr Gwyndolyn Kaufman Procedures performed: none  Disposition: Home Diet recommendation:  Discharge Diet Orders (From admission, onward)     Start     Ordered   05/05/22 0000  Diet - low sodium heart healthy        05/05/22 1249           Cardiac diet DISCHARGE MEDICATION: Allergies as of 05/05/2022       Reactions   Ibandronic Acid Other (See Comments)   Achy all over. Flu like S/S   Other Other  (See Comments)   Achy all over. Flu like S/S Dysphagia   Prednisone Rash   Actonel [risedronate] Other (See Comments)   Dysphagia   Amoxicillin Hives   Pantoprazole Other (See Comments)   Little blistery bumps   Raloxifene Other (See Comments)   Mood swings   Versed [midazolam] Other (See Comments)   Difficult waking up and memory loss   Doxycycline Rash        Medication List     TAKE these medications    acetaminophen 500 MG tablet Commonly known as: TYLENOL Take 500 mg by mouth every 6 (six) hours as needed for mild pain.   Alfalfa 500 MG Tabs Take 500 mg by mouth daily.   CALCIUM 600 PO Take by mouth.   CINNAMON PO Take 1,000 mg by mouth daily.   clotrimazole-betamethasone cream Commonly known as: LOTRISONE Apply topically 2 (two) times daily.   diclofenac sodium 1 % Gel Commonly known as: VOLTAREN Apply topically as needed.   fluticasone 50 MCG/ACT nasal spray Commonly known as: FLONASE Place 1 spray into both nostrils daily.   Glucosamine Sulfate 500 MG Caps Take 500 mg by mouth daily.   GNP GINGKO BILOBA EXTRACT PO Take 1 tablet by mouth daily.   hydrALAZINE 10 MG tablet Commonly known as: APRESOLINE Take 10 mg by mouth as needed.   Hydrocortisone Ace-Pramoxine 2.5-1 % Crea Apply topically as needed.   isosorbide mononitrate 30 MG 24 hr tablet Commonly known as: IMDUR Take 1 tablet (30 mg total) by mouth daily.  loratadine 10 MG tablet Commonly known as: CLARITIN Take 10 mg by mouth daily as needed for allergies.   Magnesium 400 MG Caps Take 400 mg by mouth daily.   metoprolol succinate 25 MG 24 hr tablet Commonly known as: Toprol XL Take 0.5 tablets (12.5 mg total) by mouth daily.   multivitamin capsule Take 1 capsule by mouth daily.   mupirocin ointment 2 % Commonly known as: BACTROBAN Apply to right leg wound daily - a small dab as needed once a day.   nitroGLYCERIN 0.4 MG SL tablet Commonly known as: NITROSTAT DISSOLVE  ONE TABLET UNDER THE TONGUE EVERY 5 MINUTES AS NEEDED FOR CHEST PAIN.  DO NOT EXCEED A TOTAL OF 3 DOSES IN 15 MINUTES   OMEGA 3 PO Take 520 mg by mouth daily.   PROBIOTIC ACIDOPHILUS PO Take 1 capsule by mouth daily.   sulfamethoxazole-trimethoprim 800-160 MG tablet Commonly known as: BACTRIM DS Take 1 tablet by mouth every 12 (twelve) hours for 7 days.   vitamin B-12 1000 MCG tablet Commonly known as: CYANOCOBALAMIN Take 1,000 mcg by mouth daily.   Vitamin C CR 500 MG Cpcr Take 500 mg by mouth daily.   Vitamin D3 75 MCG (3000 UT) Tabs Take 3,000 Units by mouth 2 (two) times daily.        Follow-up Information     Crecencio Mc, MD. Schedule an appointment as soon as possible for a visit in 1 week(s).   Specialty: Internal Medicine Contact information: [place] [address] [phone]         Kate Sable, MD .   Specialties: Cardiology, Radiology Contact information: [place] [address] [phone]         Ottie Glazier, MD. Schedule an appointment as soon as possible for a visit in 2 week(s).   Specialty: Pulmonary Disease Why: hospital f/u lung mass Contact information: [place] [address] [phone]                Discharge Exam: Danley Danker Weights   05/04/22 0025  Weight: 45.4 kg     Condition at discharge: fair  The results of significant diagnostics from this hospitalization (including imaging, microbiology, ancillary and laboratory) are listed below for reference.   Imaging Studies: CT Angio Chest/Abd/Pel for Dissection W and/or Wo Contrast  Result Date: 05/04/2022 CLINICAL DATA:  88 year old female history of left-sided chest pain. Suspected acute aortic syndrome. EXAM: CT ANGIOGRAPHY CHEST, ABDOMEN AND PELVIS TECHNIQUE: Non-contrast CT of the chest was initially obtained. Multidetector CT imaging through the chest, abdomen and pelvis was performed  using the standard protocol during bolus administration of intravenous contrast. Multiplanar reconstructed images and MIPs were obtained and reviewed to evaluate the vascular anatomy. RADIATION DOSE REDUCTION: This exam was performed according to the departmental dose-optimization program which includes automated exposure control, adjustment of the mA and/or kV according to patient size and/or use of iterative reconstruction technique. CONTRAST:  74mL OMNIPAQUE IOHEXOL 350 MG/ML SOLN COMPARISON:  CT of the abdomen and pelvis 02/18/2021. CTA of the chest, abdomen and pelvis 03/25/2020. FINDINGS: CTA CHEST FINDINGS Cardiovascular: Precontrast images demonstrate no definite crescentic high attenuation associated with the wall of the thoracic aorta to indicate the presence of acute intramural hemorrhage. Postcontrast images demonstrate no aneurysm or dissection of the thoracic aorta. Ascending thoracic aorta, mid arch and descending thoracic aorta measure 3.1 cm, 2.2 cm and 2.4 cm in diameter respectively. Great vessels are patent. There is  aortic atherosclerosis, as well as atherosclerosis of the great vessels of the mediastinum and the coronary arteries, including calcified atherosclerotic plaque in the left anterior descending and right coronary arteries. Calcifications of the aortic valve and mitral annulus. Mediastinum/Nodes: No pathologically enlarged mediastinal or hilar lymph nodes. Esophagus is unremarkable in appearance. No axillary lymphadenopathy. Lungs/Pleura: In the right upper lobe there is an elongated branching opacity which is generally low-attenuation, indicative of a fluid-filled bronchus. Adjacent to the most central aspect of this lesion there is a nodular component best appreciated on soft tissue images (axial image 35 of series 5) estimated to measure 1.0 x 0.7 cm, which likely causes the bronchial obstruction. Surrounding the branching fluid-filled surrounding the obstructed bronchus there are  patchy areas of ground-glass attenuation and consolidative airspace disease indicative of postobstructive infection/inflammation. No other definite suspicious appearing pulmonary nodules or masses are noted. Musculoskeletal: Chronic compression fracture of T11 with 20% loss of anterior vertebral body height. There are no aggressive appearing lytic or blastic lesions noted in the visualized portions of the skeleton. Review of the MIP images confirms the above findings. CTA ABDOMEN AND PELVIS FINDINGS VASCULAR Aorta: Extensive aortic atherosclerosis with a normal caliber aorta without aneurysm, dissection, vasculitis or significant stenosis. Celiac: Stent in the proximal celiac axis is patent. Vessel is without evidence of aneurysm, dissection, vasculitis or significant stenosis. SMA: Patent without evidence of aneurysm, dissection, vasculitis or significant stenosis. Renals: Both renal arteries are patent without evidence of aneurysm, dissection, vasculitis, fibromuscular dysplasia or significant stenosis. IMA: Patent without evidence of aneurysm, dissection, vasculitis or significant stenosis. Inflow: Patent without evidence of aneurysm, dissection, vasculitis or significant stenosis. Veins: No obvious venous abnormality within the limitations of this arterial phase study. Review of the MIP images confirms the above findings. NON-VASCULAR Hepatobiliary: No suspicious cystic or solid hepatic lesions. No intra or extrahepatic biliary ductal dilatation. Status post cholecystectomy. Pancreas: No pancreatic mass. No pancreatic ductal dilatation. No pancreatic or peripancreatic fluid collections or inflammatory changes. Spleen: Unremarkable. Adrenals/Urinary Tract: Bilateral kidneys and the right adrenal gland are normal in appearance. Thickening of the left adrenal gland with a central nodule measuring approximately 1.8 x 1.4 cm, stable in size compared to prior studies measuring approximately 16 HU on precontrast  images, favored to represent a benign lesions such as a lipid poor adenoma. Urinary bladder is unremarkable in appearance. Stomach/Bowel: The appearance of the stomach is normal. There is no pathologic dilatation of small bowel or colon. Numerous colonic diverticulae are noted, particularly in the sigmoid colon, without surrounding inflammatory changes to suggest an acute diverticulitis at this time. Normal appendix. Lymphatic: No lymphadenopathy noted in the abdomen or pelvis. Reproductive: Dense calcification in the right-side of the uterine body, presumably a small fibroid. Uterus and ovaries are otherwise atrophic and unremarkable in appearance. Other: No significant volume of ascites.  No pneumoperitoneum. Musculoskeletal: Chronic compression fractures of L2 and L4, most severe at L2 where there is 50% loss of anterior vertebral body height. Post vertebroplasty changes at L4 are again noted. There are no aggressive appearing lytic or blastic lesions noted in the visualized portions of the skeleton. Review of the MIP images confirms the above findings. IMPRESSION: 1. No evidence of acute aortic syndrome. 2. There appears to be a chronically obstructed bronchus in the right upper lobe with postobstructive changes in this region indicative of pneumonia/pneumonitis. Importantly, in the most associated with the most central aspect of the obstructed bronchus there is a small nodule estimated to measure approximately 1.0 x  0.7 cm concerning for neoplasm. Further evaluation with nonemergent PET-CT is recommended in the near future to better evaluate this finding. 3. Extensive colonic diverticulosis without evidence of acute diverticulitis at this time. 4. 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 5. Aortic atherosclerosis, in addition to two-vessel coronary artery disease. 6. There are calcifications of the aortic valve and mitral annulus.  Echocardiographic correlation for evaluation of potential valvular dysfunction may be warranted if clinically indicated. 7. Additional incidental findings, as above. Electronically Signed   By: Vinnie Langton M.D.   On: 05/04/2022 05:35   CT HEAD WO CONTRAST (5MM)  Result Date: 05/04/2022 CLINICAL DATA:  88 year old female with history of left-sided chest pain. Evaluate for subarachnoid hemorrhage. EXAM: CT HEAD WITHOUT CONTRAST TECHNIQUE: Contiguous axial images were obtained from the base of the skull through the vertex without intravenous contrast. RADIATION DOSE REDUCTION: This exam was performed according to the departmental dose-optimization program which includes automated exposure control, adjustment of the mA and/or kV according to patient size and/or use of iterative reconstruction technique. COMPARISON:  Head CT 12/20/2007. FINDINGS: Brain: Mild cerebral atrophy. Patchy and confluent areas of decreased attenuation are noted throughout the deep and periventricular white matter of the cerebral hemispheres bilaterally, compatible with chronic microvascular ischemic disease. More well-defined area of low attenuation in the anterior limb of the left internal capsule, indicative of an old lacunar infarct. Faint physiologic calcifications of the left basal ganglia incidentally noted. No evidence of acute infarction, hemorrhage, hydrocephalus, extra-axial collection or mass lesion/mass effect. Vascular: No hyperdense vessel or unexpected calcification. Skull: Normal. Negative for fracture or focal lesion. Sinuses/Orbits: No acute finding. Other: None. IMPRESSION: 1. No acute intracranial abnormalities. 2. Mild cerebral atrophy with chronic microvascular ischemic changes in the cerebral white matter and old left basal ganglia lacunar infarct, as above. Electronically Signed   By: Vinnie Langton M.D.   On: 05/04/2022 05:07   DG Chest Port 1 View  Result Date: 05/04/2022 CLINICAL DATA:  Chest pain EXAM:  PORTABLE CHEST 1 VIEW COMPARISON:  10/28/2021 FINDINGS: 1.3 x 2.6 cm opacity has developed within the right upper lung zone, possibly representing a primary pulmonary mass. The lungs are otherwise clear. No pneumothorax or pleural effusion. Cardiac size within normal limits. Pulmonary vascularity is normal. No acute bone abnormality. IMPRESSION: Interval development of a 2.6 cm right upper lobe opacity, possibly representing a primary pulmonary mass. This would be better assessed with dedicated CT imaging. Electronically Signed   By: Fidela Salisbury M.D.   On: 05/04/2022 01:07    Microbiology: Results for orders placed or performed during the hospital encounter of 05/04/22  Culture, Respiratory w Gram Stain     Status: None (Preliminary result)   Collection Time: 05/04/22 12:53 PM   Specimen: SPU; Respiratory  Result Value Ref Range Status   Specimen Description   Final    SPUTUM Performed at [facility], [address] [place], [place] [address]    Special Requests   Final    NONE Performed at [facility], [place], [place] [address]    Gram Stain   Final    NO WBC SEEN FEW SQUAMOUS EPITHELIAL CELLS PRESENT MODERATE GRAM POSITIVE COCCI IN CLUSTERS FEW GRAM NEGATIVE RODS    Culture   Final    CULTURE REINCUBATED FOR BETTER GROWTH Performed at [facility], [place] [address], [place], [place] [address]    Report Status  PENDING  Incomplete    Labs: CBC: Recent Labs  Lab 05/03/22 2122 05/04/22 0028 05/05/22 0502  WBC 4.6 6.6 5.6  NEUTROABS 2.3  --   --   HGB 14.1 16.3* 14.7  HCT 44.2 50.2* 44.5  MCV 91.9 90.5 89.7  PLT 189 199 540   Basic Metabolic Panel: Recent Labs  Lab 05/03/22 2122 05/04/22 0028 05/05/22 0502  NA 139 139 139  K 3.7 3.9 4.4  CL 107 104 105  CO2 24 26 25   GLUCOSE 97 168* 95  BUN 14 13 15   CREATININE 0.44 0.47 0.72  CALCIUM 8.9 9.6 9.1   Liver Function Tests: Recent Labs  Lab 05/04/22 0028  AST 44*  ALT  24  ALKPHOS 53  BILITOT 0.4  PROT 7.8  ALBUMIN 4.5   CBG: No results for input(s): "GLUCAP" in the last 168 hours.  Discharge time spent: greater than 30 minutes.  Signed: Fritzi Mandes, MD [facility] 05/05/2022

## 2022-05-05 NOTE — Care Management Obs Status (Signed)
[facility] NOTIFICATION   Patient Details  Name: [name] MRN: [id number] Date of Birth: Jun 07, 1927   Medicare Observation Status Notification Given:  Yes    Donnelly Angelica, LCSW 05/05/2022, 1:04 PM

## 2022-05-06 LAB — CULTURE, RESPIRATORY W GRAM STAIN
Culture: NORMAL
Gram Stain: NONE SEEN

## 2022-05-06 LAB — FUNGITELL, SERUM: Fungitell Result: <31 pg/mL

## 2022-05-07 ENCOUNTER — Telehealth: Payer: Self-pay

## 2022-05-07 ENCOUNTER — Ambulatory Visit: Payer: Medicare Other

## 2022-05-07 LAB — ASPERGILLUS ANTIGEN, BAL/SERUM: Aspergillus Ag, BAL/Serum: 0.06 Index (ref 0.00–0.49)

## 2022-05-07 NOTE — Telephone Encounter (Signed)
No answer when called for scheduled AWV. Left message to call the office back and reschedule.

## 2022-05-17 ENCOUNTER — Encounter: Payer: Self-pay | Admitting: Internal Medicine

## 2022-05-17 ENCOUNTER — Ambulatory Visit (INDEPENDENT_AMBULATORY_CARE_PROVIDER_SITE_OTHER): Payer: Medicare Other

## 2022-05-17 ENCOUNTER — Ambulatory Visit (INDEPENDENT_AMBULATORY_CARE_PROVIDER_SITE_OTHER): Payer: Medicare Other | Admitting: Internal Medicine

## 2022-05-17 VITALS — BP 138/74 | HR 61 | Temp 97.4°F | Resp 15 | Ht 60.0 in | Wt 98.0 lb

## 2022-05-17 DIAGNOSIS — Z0000 Encounter for general adult medical examination without abnormal findings: Secondary | ICD-10-CM | POA: Diagnosis not present

## 2022-05-17 DIAGNOSIS — R0789 Other chest pain: Secondary | ICD-10-CM

## 2022-05-17 DIAGNOSIS — I208 Other forms of angina pectoris: Secondary | ICD-10-CM

## 2022-05-17 DIAGNOSIS — R918 Other nonspecific abnormal finding of lung field: Secondary | ICD-10-CM

## 2022-05-17 DIAGNOSIS — I10 Essential (primary) hypertension: Secondary | ICD-10-CM | POA: Diagnosis not present

## 2022-05-17 NOTE — Progress Notes (Addendum)
Subjective:   [name] is a 88 y.o. female who presents for Medicare Annual (Subsequent) preventive examination.  Review of Systems    No ROS.  Medicare Wellness    Cardiac Risk Factors include: advanced age (>46men, >73 women)     Objective:    Today's Vitals   05/17/22 1040  BP: 138/74  Pulse: 61  Resp: 15  Temp: (!) 97.4 F (36.3 C)  SpO2: 97%  Weight: 98 lb (44.5 kg)  Height: 5' (1.524 m)   Body mass index is 19.14 kg/m.     05/17/2022   10:52 AM 05/04/2022    1:25 PM 05/04/2022   12:27 AM 05/30/2021    2:59 PM 05/06/2021    9:57 AM 05/22/2020    3:32 PM 05/05/2020    9:42 AM  Advanced Directives  Does Patient Have a Medical Advance Directive? Yes  No No Yes Yes Yes  Type of Paramedic of [place];Living will    [place];Living will  [place];Living will  Does patient want to make changes to medical advance directive? No - Patient declined    No - Patient declined No - Patient declined No - Patient declined  Copy of [place] in Chart? Yes - validated most recent copy scanned in chart (See row information)    Yes - validated most recent copy scanned in chart (See row information)  Yes - validated most recent copy scanned in chart (See row information)  Would patient like information on creating a medical advance directive?  No - Patient declined         Current Medications (verified) Outpatient Encounter Medications as of 05/17/2022  Medication Sig   acetaminophen (TYLENOL) 500 MG tablet Take 500 mg by mouth every 6 (six) hours as needed for mild pain.   Alfalfa 500 MG TABS Take 500 mg by mouth daily.   Ascorbic Acid (VITAMIN C CR) 500 MG CPCR Take 500 mg by mouth daily.   Calcium Carbonate (CALCIUM 600 PO) Take by mouth.   Cholecalciferol (VITAMIN D3) 3000 UNITS TABS Take 3,000 Units by mouth 2 (two) times daily.    clotrimazole-betamethasone (LOTRISONE) cream Apply  topically 2 (two) times daily.   diclofenac sodium (VOLTAREN) 1 % GEL Apply topically as needed.    fluticasone (FLONASE) 50 MCG/ACT nasal spray Place 1 spray into both nostrils daily.   Ginkgo Biloba (GNP GINGKO BILOBA EXTRACT PO) Take 1 tablet by mouth daily.   Glucosamine Sulfate 500 MG CAPS Take 500 mg by mouth daily.   hydrALAZINE (APRESOLINE) 10 MG tablet Take 10 mg by mouth as needed.   isosorbide mononitrate (IMDUR) 30 MG 24 hr tablet Take 1 tablet (30 mg total) by mouth daily.   Lactobacillus (PROBIOTIC ACIDOPHILUS PO) Take 1 capsule by mouth daily.    loratadine (CLARITIN) 10 MG tablet Take 10 mg by mouth daily as needed for allergies.   Magnesium 400 MG CAPS Take 400 mg by mouth daily.   metoprolol succinate (TOPROL XL) 25 MG 24 hr tablet Take 0.5 tablets (12.5 mg total) by mouth daily.   Multiple Vitamin (MULTIVITAMIN) capsule Take 1 capsule by mouth daily.   mupirocin ointment (BACTROBAN) 2 % Apply to right leg wound daily - a small dab as needed once a day.   nitroGLYCERIN (NITROSTAT) 0.4 MG SL tablet DISSOLVE ONE TABLET UNDER THE TONGUE EVERY 5 MINUTES AS NEEDED FOR CHEST PAIN.  DO NOT EXCEED A TOTAL OF  3 DOSES IN 15 MINUTES   Omega-3 Fatty Acids (OMEGA 3 PO) Take 520 mg by mouth daily.    Pramoxine-HC (HYDROCORTISONE ACE-PRAMOXINE) 2.5-1 % CREA Apply topically as needed.    vitamin B-12 (CYANOCOBALAMIN) 1000 MCG tablet Take 1,000 mcg by mouth daily.   No facility-administered encounter medications on file as of 05/17/2022.    Allergies (verified) Ibandronic acid, Other, Prednisone, Actonel [risedronate], Amoxicillin, Pantoprazole, Raloxifene, Versed [midazolam], and Doxycycline   History: Past Medical History:  Diagnosis Date   Allergy    Arthritis    Atherosclerosis    Atherosclerosis of abdominal aorta (HCC)    Chronic kidney disease    stage 2   CKD (chronic kidney disease) stage 2, GFR 60-89 ml/min    Collagen vascular disease (HCC)    Compression fracture of  thoracic spine, non-traumatic ([place]) 08/19/2015   Seen on plain films October 2016   Diverticulosis 2008   GERD (gastroesophageal reflux disease)    Heart murmur    Hyperlipidemia    Lumbar spinal stenosis    Osteoporosis    Ovarian lump    RA (rheumatoid arthritis) ([place])    Raynaud's disease    Rheumatoid arthritis ([place])    Spinal stenosis    Past Surgical History:  Procedure Laterality Date   BREAST SURGERY Left 1965   Benign biopsy   CHOLECYSTECTOMY  2005   [place]   COLONOSCOPY  2008   Dr. Donnella Sham   EXCISION OF BREAST BIOPSY Left 1965   benign   EYE SURGERY Bilateral    Cataract Extraction with IOL   KYPHOPLASTY N/A 12/17/2016   Procedure: KYPHOPLASTY;  Surgeon: Hessie Knows, MD;  Location: [facility];  Service: Orthopedics;  Laterality: N/A;  l4   LUMBAR LAMINECTOMY  05/09/2017   L2-L5   VISCERAL ANGIOGRAPHY N/A 08/15/2017   Procedure: VISCERAL ANGIOGRAPHY;  Surgeon: Algernon Huxley, MD;  Location: [place] [facility];  Service: Cardiovascular;  Laterality: N/A;   VISCERAL ANGIOGRAPHY N/A 10/05/2017   Procedure: VISCERAL ANGIOGRAPHY;  Surgeon: Algernon Huxley, MD;  Location: [place] [facility];  Service: Cardiovascular;  Laterality: N/A;   VISCERAL ARTERY INTERVENTION N/A 08/15/2017   Procedure: VISCERAL ARTERY INTERVENTION;  Surgeon: Algernon Huxley, MD;  Location: [place] [facility];  Service: Cardiovascular;  Laterality: N/A;   Family History  Problem Relation Age of Onset   Heart disease Mother    Hypertension Father    Heart disease Brother 50       heart attack   Cancer Brother        bladder   Diabetes Sister    Alcohol abuse Sister    Kidney disease Sister    Diabetes Brother    Stroke Brother    Diabetes Maternal Grandmother    Heart disease Maternal Grandmother    Cancer Brother        lung   Social History   Socioeconomic History   Marital status: Widowed    Spouse name: Gwyndolyn Saxon   Number of children: 3   Years of education: some college   Highest  education level: 12th grade  Occupational History    Employer: RETIRED  Tobacco Use   Smoking status: Never   Smokeless tobacco: Never   Tobacco comments:    smoking cessation materials not required  Vaping Use   Vaping Use: Never used  Substance and Sexual Activity   Alcohol use: No    Alcohol/week: 0.0 standard drinks of alcohol   Drug use: No  Sexual activity: Not Currently  Other Topics Concern   Not on file  Social History Narrative   Not on file   Social Determinants of Health   Financial Resource Strain: Low Risk  (05/17/2022)   Overall Financial Resource Strain (CARDIA)    Difficulty of Paying Living Expenses: Not hard at all  Food Insecurity: No Food Insecurity (05/17/2022)   Hunger Vital Sign    Worried About Running Out of Food in the Last Year: Never true    Ran Out of Food in the Last Year: Never true  Transportation Needs: No Transportation Needs (05/17/2022)   PRAPARE - Hydrologist (Medical): No    Lack of Transportation (Non-Medical): No  Physical Activity: Sufficiently Active (05/17/2022)   Exercise Vital Sign    Days of Exercise per Week: 7 days    Minutes of Exercise per Session: 30 min  Stress: No Stress Concern Present (05/17/2022)   [place]    Feeling of Stress : Not at all  Social Connections: Moderately Integrated (05/17/2022)   Social Connection and Isolation Panel [NHANES]    Frequency of Communication with Friends and Family: More than three times a week    Frequency of Social Gatherings with Friends and Family: Three times a week    Attends Religious Services: More than 4 times per year    Active Member of Clubs or Organizations: Yes    Attends Archivist Meetings: More than 4 times per year    Marital Status: Widowed    Tobacco Counseling Counseling given: Not Answered Tobacco comments: smoking cessation materials not  required   Clinical Intake:  Pre-visit preparation completed: Yes        Diabetes: No  How often do you need to have someone help you when you read instructions, pamphlets, or other written materials from your doctor or pharmacy?: 1 - Never  Interpreter Needed?: No      Activities of Daily Living    05/17/2022   10:54 AM 05/04/2022    1:20 PM  In your present state of health, do you have any difficulty performing the following activities:  Hearing? 1   Comment Hearing aids   Vision? 0   Difficulty concentrating or making decisions? 0   Walking or climbing stairs? 0   Dressing or bathing? 0   Doing errands, shopping? 1 0  Comment Family Diplomatic Services operational officer and eating ? N   Using the Toilet? N   In the past six months, have you accidently leaked urine? Y   Comment Managed with daily brief. Followed by Urology as needed.   Do you have problems with loss of bowel control? N   Managing your Medications? N   Managing your Finances? N   Housekeeping or managing your Housekeeping? N     Patient Care Team: Crecencio Mc, MD as PCP - General (Internal Medicine) Kate Sable, MD as PCP - Cardiology (Cardiology) Dingeldein, Remo Lipps, MD (Ophthalmology) Clyde Canterbury, MD as Referring Physician (Otolaryngology) Corey Skains, MD as Consulting Physician (Cardiology) Oneta Rack, MD as Consulting Physician (Dermatology) Lucky Cowboy Erskine Squibb, MD as Consulting Physician (Vascular Surgery)  Indicate any recent Medical Services you may have received from other than [facility] providers in the past year (date may be approximate).     Assessment:   This is a routine wellness examination for Zamora.  Hearing/Vision screen Hearing Screening - Comments:: Hearing aids Followed  by [facility] Vision Screening - Comments:: Followed by [facility], Dr. Jeni Salles.  Wears corrective lenses  They have seen their ophthalmologist in the last 12 months  Dietary  issues and exercise activities discussed: Current Exercise Habits: Home exercise routine, Frequency (Times/Week): 7, Intensity: Mild Healthy diet Good water intake   Goals Addressed             This Visit's Progress    COMPLETED: DIET - INCREASE WATER INTAKE   On track    Stay hydrated     Maintain Healthy Lifestyle   On track    Stay active Stay hydrated       Depression Screen    05/17/2022   10:15 AM 11/16/2021    9:13 AM 05/15/2021    2:12 PM 05/06/2021    9:55 AM 04/01/2021    3:40 PM 05/05/2020    9:43 AM 09/19/2019    8:18 AM  PHQ 2/9 Scores  PHQ - 2 Score 0 0 0 0 0 0 0  PHQ- 9 Score 2 0  0 2      Fall Risk    05/17/2022   10:54 AM 11/16/2021    9:13 AM 06/04/2021    1:41 PM 05/15/2021    2:12 PM 05/06/2021    9:58 AM  Fall Risk   Falls in the past year?  0 0 0 0  Number falls in past yr:     0  Injury with Fall?     0  Risk for fall due to :  No Fall Risks     Follow up Falls evaluation completed Falls evaluation completed Falls evaluation completed Falls evaluation completed Falls evaluation completed    [place]: Home free of loose throw rugs in walkways, pet beds, electrical cords, etc? Yes  Adequate lighting in your home to reduce risk of falls? Yes   ASSISTIVE DEVICES UTILIZED TO PREVENT FALLS: Use of a cane, walker or w/c? No   TIMED UP AND GO: Was the test performed? Yes .  Length of time to ambulate 10 feet: 15 sec.   Gait slow and steady without use of assistive device  Cognitive Function:  Patient is alert and oriented x3.  Enjoys playing cards, reading daily devotionals, exploring new recipes and other things that encourage brain health stimulation.       05/05/2020    9:59 AM 05/03/2019    2:29 PM 04/28/2018    2:28 PM 04/19/2017    8:43 AM  6CIT Screen  What Year? 0 points 0 points 0 points 0 points  What month? 0 points 0 points 0 points 0 points  What time?  0 points 0 points 0 points  Count back  from 20  0 points 0 points 0 points  Months in reverse 0 points 0 points 0 points 0 points  Repeat phrase  0 points 2 points 0 points  Total Score  0 points 2 points 0 points    Immunizations Immunization History  Administered Date(s) Administered   Fluad Quad(high Dose 65+) 08/07/2019, 08/26/2020   Influenza, High Dose Seasonal PF 09/12/2018   Influenza,inj,Quad PF,6+ Mos 08/15/2015   Influenza-Unspecified 08/20/2011, 08/11/2012, 09/08/2013, 09/05/2014, 08/15/2015, 09/13/2016, 08/25/2017, 08/08/2021   PFIZER(Purple Top)SARS-COV-2 Vaccination 12/04/2019, 12/25/2019, 10/17/2020   Pneumococcal Conjugate-13 12/13/2014   Pneumococcal Polysaccharide-23 11/08/2000, 08/08/2006   Tdap 11/08/2008, 03/14/2019   Zoster, Live 01/07/2004, 11/08/2008   Singles vaccine- discontinued per patient.   Screening Tests Health  Maintenance  Topic Date Due   COVID-19 Vaccine (4 - Booster for Pfizer series) 06/02/2022 (Originally 12/12/2020)   INFLUENZA VACCINE  06/08/2022   TETANUS/TDAP  03/13/2029   Pneumonia Vaccine 42+ Years old  Completed   DEXA SCAN  Completed   HPV VACCINES  Aged Out   Zoster Vaccines- Shingrix  Discontinued   Health Maintenance There are no preventive care reminders to display for this patient.  Lung Cancer Screening: (Low Dose CT Chest recommended if Age 67-80 years, 30 pack-year currently smoking OR have quit w/in 15years.) does not qualify.   Hepatitis C Screening: does not qualify.  Vision Screening: Recommended annual ophthalmology exams for early detection of glaucoma and other disorders of the eye.  Dental Screening: Recommended annual dental exams for proper oral hygiene.  Community Resource Referral / Chronic Care Management: CRR required this visit?  No   CCM required this visit?  No      Plan:   Keep all routine maintenance appointments.   I have personally reviewed and noted the following in the patient's chart:   Medical and social history Use of  alcohol, tobacco or illicit drugs  Current medications and supplements including opioid prescriptions.  Functional ability and status Nutritional status Physical activity Advanced directives List of other physicians Hospitalizations, surgeries, and ER visits in previous 12 months Vitals Screenings to include cognitive, depression, and falls Referrals and appointments  In addition, I have reviewed and discussed with patient certain preventive protocols, quality metrics, and best practice recommendations. A written personalized care plan for preventive services as well as general preventive health recommendations were provided to patient.     OBrien-Blaney, Elon Lomeli L, LPN   1/44/[id number]    I have reviewed the above information and agree with above.   Deborra Medina, MD

## 2022-05-17 NOTE — Patient Instructions (Addendum)
  [name] , Thank you for taking time to come for your Medicare Wellness Visit. I appreciate your ongoing commitment to your health goals. Please review the following plan we discussed and let me know if I can assist you in the future.   These are the goals we discussed:  Goals      Maintain Healthy Lifestyle     Stay active Stay hydrated        This is a list of the screening recommended for you and due dates:  Health Maintenance  Topic Date Due   COVID-19 Vaccine (4 - Booster for Pfizer series) 06/02/2022*   Flu Shot  06/08/2022   Tetanus Vaccine  03/13/2029   Pneumonia Vaccine  Completed   DEXA scan (bone density measurement)  Completed   HPV Vaccine  Aged Out   Zoster (Shingles) Vaccine  Discontinued  *Topic was postponed. The date shown is not the original due date.

## 2022-05-17 NOTE — Progress Notes (Unsigned)
Subjective:  Patient ID: [name], female    DOB: 04/06/1927  Age: 88 y.o. MRN: [id number]  CC: There were no encounter diagnoses.   HPI [name] presents for  Chief Complaint  Patient presents with   Follow-up    6 month follow up on hypertension    Admitted from ED on June 27 for observation following  Jun 26 treatment for hypertensive urgency, returned one hour after D/c with left sided chest pain.  Ruled out for AMI.  But CXR was abnormal,  and CT angiogram of chest was done  and noted a new 1.3 x 2.6 cm RUL mass was  found (last chest x ray Dec 2022 not seen )  serologies for fungus , aspergillus and cryptococcus negative and gram stain /culture of sputum were all negative .   Pulmonary follow up with [facility] July 13 . Took Bactrim  for a week after discharge .  Already taking a probiotic daily   HPI:  woke up feeling bad. Had felt worn out  for 48 hours prior on Monday checked BP bc felt bad.  Systolic was 789 and did not respond to hydralazine 10 mg x 2 doses.  Treated with IV hydralazine and sent home.  Once home developed left lower side chest pain (under left breast) and dyspnea.  Returned to ED.  She reports that her right side has been painful from the shoulder to the right hip,  when lying on her back  , "for years"    BP issues started in 2018, around the time of her celiac stent.      BP since home  has been 381 or less systolic.  Has not used hydralazine.   Energy level low most of the time   Outpatient Medications Prior to Visit  Medication Sig Dispense Refill   acetaminophen (TYLENOL) 500 MG tablet Take 500 mg by mouth every 6 (six) hours as needed for mild pain.     Alfalfa 500 MG TABS Take 500 mg by mouth daily.     Ascorbic Acid (VITAMIN C CR) 500 MG CPCR Take 500 mg by mouth daily.     Calcium Carbonate (CALCIUM 600 PO) Take by mouth.     Cholecalciferol (VITAMIN D3) 3000 UNITS TABS Take 3,000 Units by mouth 2 (two) times daily.       clotrimazole-betamethasone (LOTRISONE) cream Apply topically 2 (two) times daily. 30 g 1   diclofenac sodium (VOLTAREN) 1 % GEL Apply topically as needed.      fluticasone (FLONASE) 50 MCG/ACT nasal spray Place 1 spray into both nostrils daily.     Ginkgo Biloba (GNP GINGKO BILOBA EXTRACT PO) Take 1 tablet by mouth daily.     Glucosamine Sulfate 500 MG CAPS Take 500 mg by mouth daily.     hydrALAZINE (APRESOLINE) 10 MG tablet Take 10 mg by mouth as needed.     isosorbide mononitrate (IMDUR) 30 MG 24 hr tablet Take 1 tablet (30 mg total) by mouth daily. 30 tablet 3   Lactobacillus (PROBIOTIC ACIDOPHILUS PO) Take 1 capsule by mouth daily.      loratadine (CLARITIN) 10 MG tablet Take 10 mg by mouth daily as needed for allergies.     Magnesium 400 MG CAPS Take 400 mg by mouth daily.     metoprolol succinate (TOPROL XL) 25 MG 24 hr tablet Take 0.5 tablets (12.5 mg total) by mouth daily. 45 tablet 2   Multiple Vitamin (MULTIVITAMIN) capsule Take 1 capsule  by mouth daily.     mupirocin ointment (BACTROBAN) 2 % Apply to right leg wound daily - a small dab as needed once a day. 15 g 0   nitroGLYCERIN (NITROSTAT) 0.4 MG SL tablet DISSOLVE ONE TABLET UNDER THE TONGUE EVERY 5 MINUTES AS NEEDED FOR CHEST PAIN.  DO NOT EXCEED A TOTAL OF 3 DOSES IN 15 MINUTES 50 tablet 0   Omega-3 Fatty Acids (OMEGA 3 PO) Take 520 mg by mouth daily.      Pramoxine-HC (HYDROCORTISONE ACE-PRAMOXINE) 2.5-1 % CREA Apply topically as needed.      vitamin B-12 (CYANOCOBALAMIN) 1000 MCG tablet Take 1,000 mcg by mouth daily.     CINNAMON PO Take 1,000 mg by mouth daily. (Patient not taking: Reported on 05/17/2022)     No facility-administered medications prior to visit.    Review of Systems;  Patient denies headache, fevers, malaise, unintentional weight loss, skin rash, eye pain, sinus congestion and sinus pain, sore throat, dysphagia,  hemoptysis , cough, dyspnea, wheezing, chest pain, palpitations, orthopnea, edema, abdominal  pain, nausea, melena, diarrhea, constipation, flank pain, dysuria, hematuria, urinary  Frequency, nocturia, numbness, tingling, seizures,  Focal weakness, Loss of consciousness,  Tremor, insomnia, depression, anxiety, and suicidal ideation.      Objective:  BP 138/74 (BP Location: Left Arm, Patient Position: Sitting, Cuff Size: Normal)   Pulse 61   Temp (!) 97.4 F (36.3 C) (Oral)   Ht 5' (1.524 m)   Wt 98 lb 9.6 oz (44.7 kg)   SpO2 97%   BMI 19.26 kg/m   BP Readings from Last 3 Encounters:  05/17/22 138/74  05/05/22 (!) 101/59  05/03/22 131/64    Wt Readings from Last 3 Encounters:  05/17/22 98 lb 9.6 oz (44.7 kg)  05/04/22 100 lb 1.4 oz (45.4 kg)  05/03/22 100 lb (45.4 kg)    General appearance: alert, cooperative and appears stated age Ears: normal TM's and external ear canals both ears Throat: lips, mucosa, and tongue normal; teeth and gums normal Neck: no adenopathy, no carotid bruit, supple, symmetrical, trachea midline and thyroid not enlarged, symmetric, no tenderness/mass/nodules Back: symmetric, no curvature. ROM normal. No CVA tenderness. Lungs: clear to auscultation bilaterally Heart: regular rate and rhythm, S1, S2 normal, no murmur, click, rub or gallop Abdomen: soft, non-tender; bowel sounds normal; no masses,  no organomegaly Pulses: 2+ and symmetric Skin: Skin color, texture, turgor normal. No rashes or lesions Lymph nodes: Cervical, supraclavicular, and axillary nodes normal.  Lab Results  Component Value Date   HGBA1C 7.0 (H) 11/16/2021   HGBA1C 6.1 (H) 05/14/2019   HGBA1C 6.2 (H) 01/01/2019    Lab Results  Component Value Date   CREATININE 0.72 05/05/2022   CREATININE 0.47 05/04/2022   CREATININE 0.44 05/03/2022    Lab Results  Component Value Date   WBC 5.6 05/05/2022   HGB 14.7 05/05/2022   HCT 44.5 05/05/2022   PLT 178 05/05/2022   GLUCOSE 95 05/05/2022   CHOL 204 (H) 11/16/2021   TRIG 127.0 11/16/2021   HDL 68.10 11/16/2021    LDLCALC 110 (H) 11/16/2021   ALT 24 05/04/2022   AST 44 (H) 05/04/2022   NA 139 05/05/2022   K 4.4 05/05/2022   CL 105 05/05/2022   CREATININE 0.72 05/05/2022   BUN 15 05/05/2022   CO2 25 05/05/2022   TSH 1.433 06/12/2021   HGBA1C 7.0 (H) 11/16/2021   MICROALBUR <0.7 11/16/2021    CT Angio Chest/Abd/Pel for Dissection W and/or Wo Contrast  Result Date: 05/04/2022 CLINICAL DATA:  88 year old female history of left-sided chest pain. Suspected acute aortic syndrome. EXAM: CT ANGIOGRAPHY CHEST, ABDOMEN AND PELVIS TECHNIQUE: Non-contrast CT of the chest was initially obtained. Multidetector CT imaging through the chest, abdomen and pelvis was performed using the standard protocol during bolus administration of intravenous contrast. Multiplanar reconstructed images and MIPs were obtained and reviewed to evaluate the vascular anatomy. RADIATION DOSE REDUCTION: This exam was performed according to the departmental dose-optimization program which includes automated exposure control, adjustment of the mA and/or kV according to patient size and/or use of iterative reconstruction technique. CONTRAST:  52mL OMNIPAQUE IOHEXOL 350 MG/ML SOLN COMPARISON:  CT of the abdomen and pelvis 02/18/2021. CTA of the chest, abdomen and pelvis 03/25/2020. FINDINGS: CTA CHEST FINDINGS Cardiovascular: Precontrast images demonstrate no definite crescentic high attenuation associated with the wall of the thoracic aorta to indicate the presence of acute intramural hemorrhage. Postcontrast images demonstrate no aneurysm or dissection of the thoracic aorta. Ascending thoracic aorta, mid arch and descending thoracic aorta measure 3.1 cm, 2.2 cm and 2.4 cm in diameter respectively. Great vessels are patent. There is aortic atherosclerosis, as well as atherosclerosis of the great vessels of the mediastinum and the coronary arteries, including calcified atherosclerotic plaque in the left anterior descending and right coronary arteries.  Calcifications of the aortic valve and mitral annulus. Mediastinum/Nodes: No pathologically enlarged mediastinal or hilar lymph nodes. Esophagus is unremarkable in appearance. No axillary lymphadenopathy. Lungs/Pleura: In the right upper lobe there is an elongated branching opacity which is generally low-attenuation, indicative of a fluid-filled bronchus. Adjacent to the most central aspect of this lesion there is a nodular component best appreciated on soft tissue images (axial image 35 of series 5) estimated to measure 1.0 x 0.7 cm, which likely causes the bronchial obstruction. Surrounding the branching fluid-filled surrounding the obstructed bronchus there are patchy areas of ground-glass attenuation and consolidative airspace disease indicative of postobstructive infection/inflammation. No other definite suspicious appearing pulmonary nodules or masses are noted. Musculoskeletal: Chronic compression fracture of T11 with 20% loss of anterior vertebral body height. There are no aggressive appearing lytic or blastic lesions noted in the visualized portions of the skeleton. Review of the MIP images confirms the above findings. CTA ABDOMEN AND PELVIS FINDINGS VASCULAR Aorta: Extensive aortic atherosclerosis with a normal caliber aorta without aneurysm, dissection, vasculitis or significant stenosis. Celiac: Stent in the proximal celiac axis is patent. Vessel is without evidence of aneurysm, dissection, vasculitis or significant stenosis. SMA: Patent without evidence of aneurysm, dissection, vasculitis or significant stenosis. Renals: Both renal arteries are patent without evidence of aneurysm, dissection, vasculitis, fibromuscular dysplasia or significant stenosis. IMA: Patent without evidence of aneurysm, dissection, vasculitis or significant stenosis. Inflow: Patent without evidence of aneurysm, dissection, vasculitis or significant stenosis. Veins: No obvious venous abnormality within the limitations of this  arterial phase study. Review of the MIP images confirms the above findings. NON-VASCULAR Hepatobiliary: No suspicious cystic or solid hepatic lesions. No intra or extrahepatic biliary ductal dilatation. Status post cholecystectomy. Pancreas: No pancreatic mass. No pancreatic ductal dilatation. No pancreatic or peripancreatic fluid collections or inflammatory changes. Spleen: Unremarkable. Adrenals/Urinary Tract: Bilateral kidneys and the right adrenal gland are normal in appearance. Thickening of the left adrenal gland with a central nodule measuring approximately 1.8 x 1.4 cm, stable in size compared to prior studies measuring approximately 16 HU on precontrast images, favored to represent a benign lesions such as a lipid poor adenoma. Urinary bladder is unremarkable in appearance. Stomach/Bowel: The appearance  of the stomach is normal. There is no pathologic dilatation of small bowel or colon. Numerous colonic diverticulae are noted, particularly in the sigmoid colon, without surrounding inflammatory changes to suggest an acute diverticulitis at this time. Normal appendix. Lymphatic: No lymphadenopathy noted in the abdomen or pelvis. Reproductive: Dense calcification in the right-side of the uterine body, presumably a small fibroid. Uterus and ovaries are otherwise atrophic and unremarkable in appearance. Other: No significant volume of ascites.  No pneumoperitoneum. Musculoskeletal: Chronic compression fractures of L2 and L4, most severe at L2 where there is 50% loss of anterior vertebral body height. Post vertebroplasty changes at L4 are again noted. There are no aggressive appearing lytic or blastic lesions noted in the visualized portions of the skeleton. Review of the MIP images confirms the above findings. IMPRESSION: 1. No evidence of acute aortic syndrome. 2. There appears to be a chronically obstructed bronchus in the right upper lobe with postobstructive changes in this region indicative of  pneumonia/pneumonitis. Importantly, in the most associated with the most central aspect of the obstructed bronchus there is a small nodule estimated to measure approximately 1.0 x 0.7 cm concerning for neoplasm. Further evaluation with nonemergent PET-CT is recommended in the near future to better evaluate this finding. 3. Extensive colonic diverticulosis without evidence of acute diverticulitis at this time. 4. 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 5. Aortic atherosclerosis, in addition to two-vessel coronary artery disease. 6. There are calcifications of the aortic valve and mitral annulus. Echocardiographic correlation for evaluation of potential valvular dysfunction may be warranted if clinically indicated. 7. Additional incidental findings, as above. Electronically Signed   By: Vinnie Langton M.D.   On: 05/04/2022 05:35   CT HEAD WO CONTRAST (5MM)  Result Date: 05/04/2022 CLINICAL DATA:  88 year old female with history of left-sided chest pain. Evaluate for subarachnoid hemorrhage. EXAM: CT HEAD WITHOUT CONTRAST TECHNIQUE: Contiguous axial images were obtained from the base of the skull through the vertex without intravenous contrast. RADIATION DOSE REDUCTION: This exam was performed according to the departmental dose-optimization program which includes automated exposure control, adjustment of the mA and/or kV according to patient size and/or use of iterative reconstruction technique. COMPARISON:  Head CT 12/20/2007. FINDINGS: Brain: Mild cerebral atrophy. Patchy and confluent areas of decreased attenuation are noted throughout the deep and periventricular white matter of the cerebral hemispheres bilaterally, compatible with chronic microvascular ischemic disease. More well-defined area of low attenuation in the anterior limb of the left internal capsule, indicative of an old lacunar infarct. Faint physiologic calcifications of the  left basal ganglia incidentally noted. No evidence of acute infarction, hemorrhage, hydrocephalus, extra-axial collection or mass lesion/mass effect. Vascular: No hyperdense vessel or unexpected calcification. Skull: Normal. Negative for fracture or focal lesion. Sinuses/Orbits: No acute finding. Other: None. IMPRESSION: 1. No acute intracranial abnormalities. 2. Mild cerebral atrophy with chronic microvascular ischemic changes in the cerebral white matter and old left basal ganglia lacunar infarct, as above. Electronically Signed   By: Vinnie Langton M.D.   On: 05/04/2022 05:07   DG Chest Port 1 View  Result Date: 05/04/2022 CLINICAL DATA:  Chest pain EXAM: PORTABLE CHEST 1 VIEW COMPARISON:  10/28/2021 FINDINGS: 1.3 x 2.6 cm opacity has developed within the right upper lung zone, possibly representing a primary pulmonary mass. The lungs are otherwise clear. No pneumothorax or pleural effusion. Cardiac size within normal limits. Pulmonary vascularity is normal. No acute bone abnormality. IMPRESSION: Interval development of a  2.6 cm right upper lobe opacity, possibly representing a primary pulmonary mass. This would be better assessed with dedicated CT imaging. Electronically Signed   By: Fidela Salisbury M.D.   On: 05/04/2022 01:07    Assessment & Plan:   Problem List Items Addressed This Visit   None   I spent a total of   minutes with this patient in a face to face visit on the date of this encounter reviewing the last office visit with me on        ,  most recent with patient's cardiologist in    ,  patient'ss diet and eating habits, home blood pressure readings ,  most recent imaging study ,   and post visit ordering of testing and therapeutics.    Follow-up: No follow-ups on file.   Crecencio Mc, MD

## 2022-05-17 NOTE — Patient Instructions (Addendum)
The next time you need to use hydralazine to lower your pressure,   start with a 20 mg dose.  If you start to need hydralazine on a daily basis for your blood pressure,  please  let me know and I will prescribe another daily blood pressure medication called amlodipine  that will last 24 hours so it can be taken just  once daily

## 2022-05-18 NOTE — Assessment & Plan Note (Signed)
She ruled out for AMI during 2nd trip to ER

## 2022-05-18 NOTE — Assessment & Plan Note (Signed)
Improved with chang in adminisration of PPI therapy to dinner time. Advised her to use a wedge pillow at night to minimize epigastric pain

## 2022-05-18 NOTE — Assessment & Plan Note (Addendum)
Etiology unclear .  Workup thus far has been noninvasive and negative for fungal etiologies. , pulmonology follow up is later on this week

## 2022-05-18 NOTE — Assessment & Plan Note (Addendum)
Events of recent admission reviewed.  He was advised to add amlodipine 2.5 mg daily if hydralazine use becomes needed daily to keep SBP < 150

## 2022-05-20 DIAGNOSIS — R911 Solitary pulmonary nodule: Secondary | ICD-10-CM | POA: Diagnosis not present

## 2022-05-24 ENCOUNTER — Other Ambulatory Visit: Payer: Self-pay | Admitting: Pulmonary Disease

## 2022-05-24 DIAGNOSIS — R911 Solitary pulmonary nodule: Secondary | ICD-10-CM

## 2022-05-27 DIAGNOSIS — L739 Follicular disorder, unspecified: Secondary | ICD-10-CM | POA: Diagnosis not present

## 2022-05-27 IMAGING — MR MR HEAD WO/W CM
14 series · 48 of 48 positions shown · IV contrast (gadavist)
Comparison: None.

CLINICAL DATA: Sixth nerve palsy of right eye

EXAM:
MRI HEAD WITHOUT AND WITH CONTRAST
TECHNIQUE: Multiplanar, multiecho pulse sequences of the brain and surrounding
structures were obtained without and with intravenous contrast.
CONTRAST:  5mL GADAVIST GADOBUTROL 1 MMOL/ML IV SOLN

[Series 5: ax dwi_tracew · axial · 3.0mm · 0.65mm/px · z∈[-96,+56]mm · 2 of 48 slices shown]
[im 1/48]
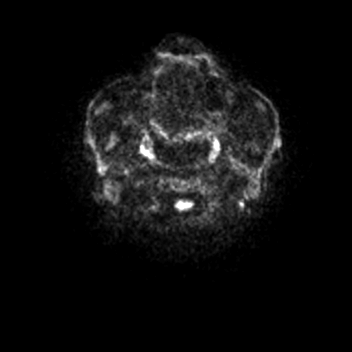
[im 48/48]
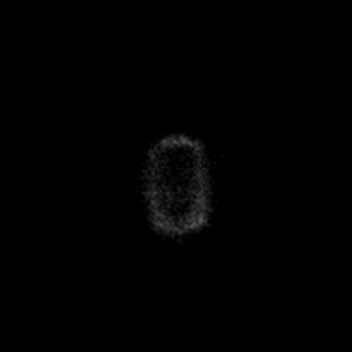

[Series 6: ax dwi_adc · axial · 3.0mm · 0.65mm/px · z∈[-96,+56]mm · 3 of 48 slices shown]
[im 1/48]
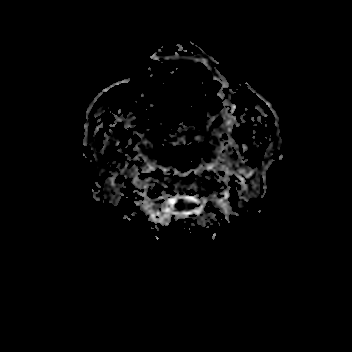
[im 24/48]
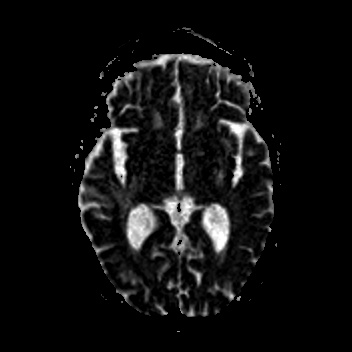
[im 48/48]
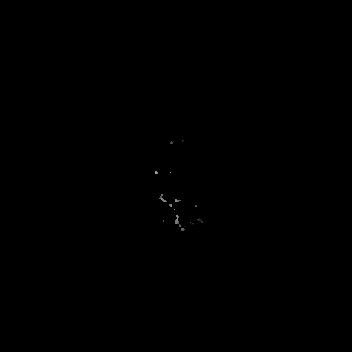

[Series 7: cor dwi_tracew · coronal · 5.0mm · 0.68mm/px · 2 of 40 slices shown]
[im 1/40]
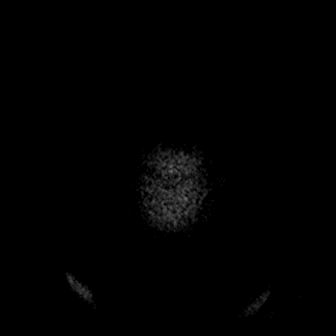
[im 40/40]
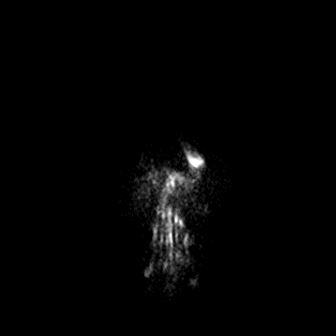

[Series 8: cor dwi_adc · coronal · 5.0mm · 0.68mm/px · 2 of 40 slices shown]
[im 1/40]
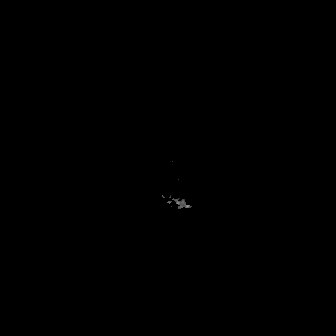
[im 40/40]
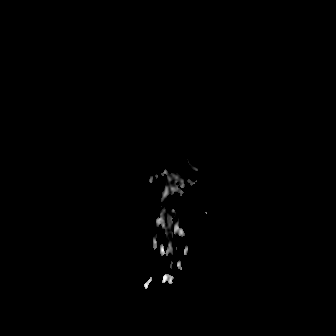

[Series 9: T1 · sagittal · 5.0mm · 0.62mm/px · 1 of 21 slices shown (1 of 2)]
[im 1/21]
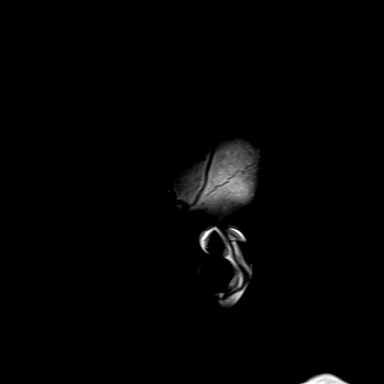

[Series 10: T2 · axial · 5.0mm · 0.53mm/px · z∈[-100,+59]mm · 2 of 28 slices shown]
[im 1/28]
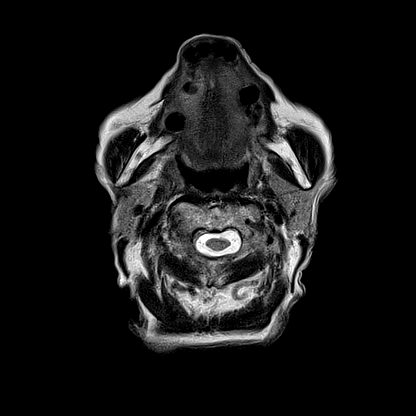
[im 28/28]
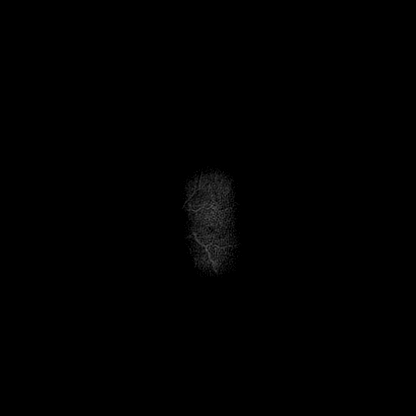

[Series 11: mag_images · axial · 3.0mm · 0.90mm/px · z∈[-104,+69]mm · 3 of 60 slices shown]
[im 1/60]
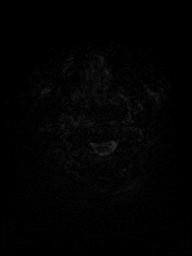
[im 30/60]
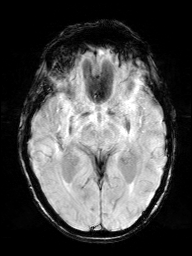
[im 60/60]
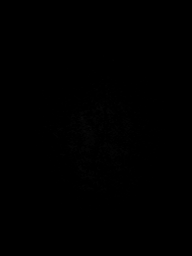

[Series 12: pha_images · axial · 3.0mm · 0.90mm/px · z∈[-104,+61]mm · 3 of 57 slices shown]
[im 1/57]
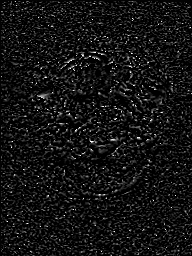
[im 29/57]
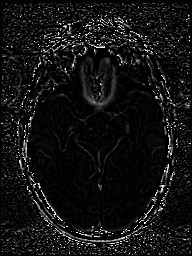
[im 57/57]
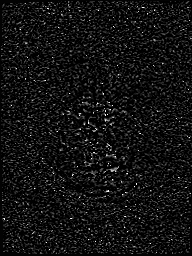

[Series 13: swi_images · axial · 3.0mm · 0.90mm/px · z∈[-104,+69]mm · 3 of 60 slices shown]
[im 1/60]
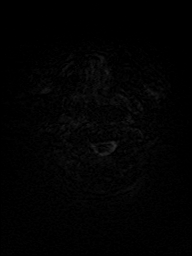
[im 30/60]
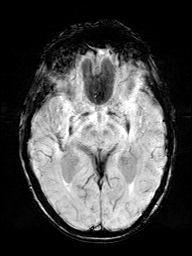
[im 60/60]
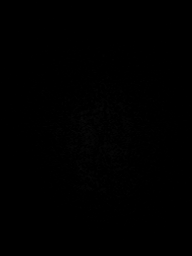

[Series 15: FLAIR · axial · 3.0mm · 0.53mm/px · z∈[-97,+61]mm · 3 of 55 slices shown]
[im 1/55]
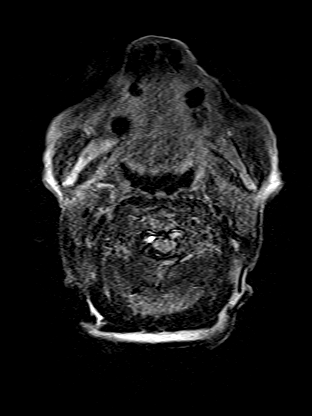
[im 28/55]
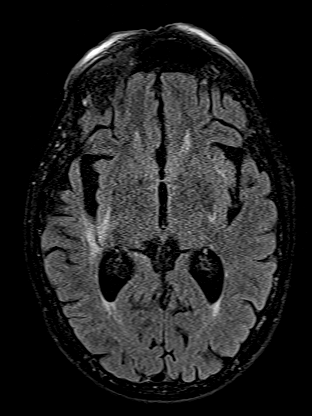
[im 55/55]
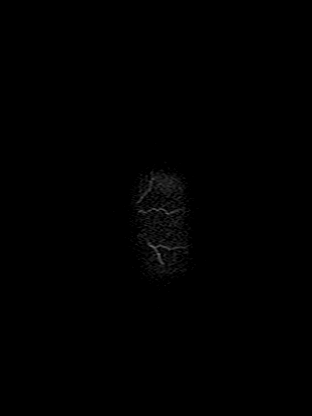

[Series 16: T1 · axial · 1.0mm · 0.98mm/px · z∈[-96,+75]mm · 10 of 176 slices shown (2 of 2)]
[im 1/176]
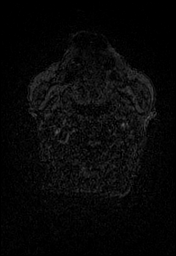
[im 20/176]
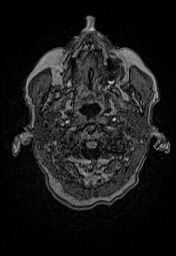
[im 39/176]
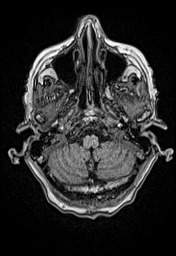
[im 59/176]
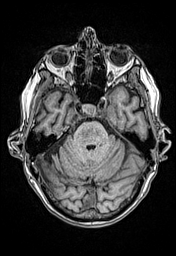
[im 78/176]
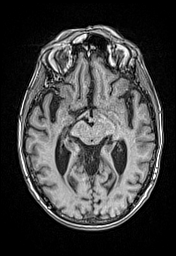
[im 98/176]
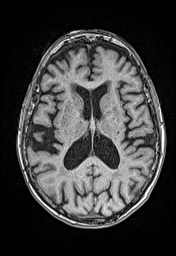
[im 117/176]
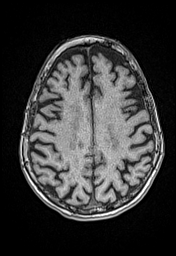
[im 137/176]
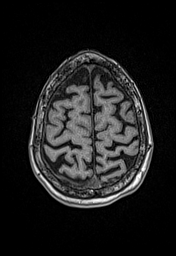
[im 156/176]
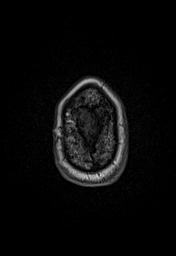
[im 176/176]
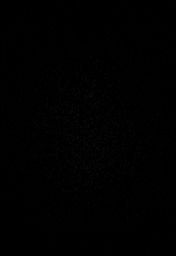

[Series 17: T2 post-contrast · coronal · 5.0mm · 0.57mm/px · 2 of 30 slices shown]
[im 1/30]
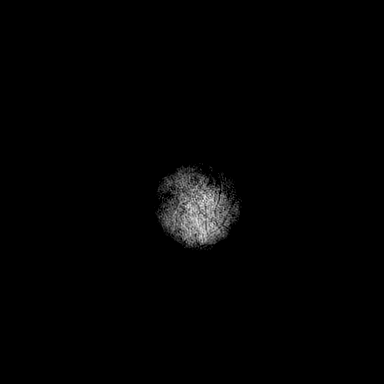
[im 30/30]
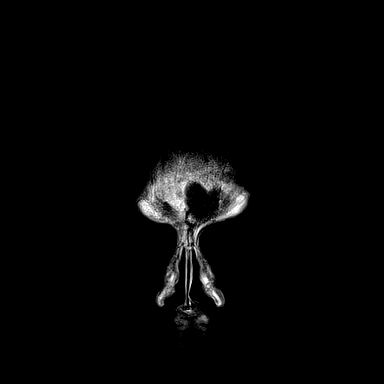

[Series 18: T1 post-contrast · coronal · 5.0mm · 0.57mm/px · 2 of 30 slices shown (1 of 2)]
[im 1/30]
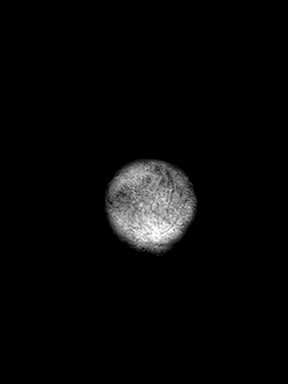
[im 30/30]
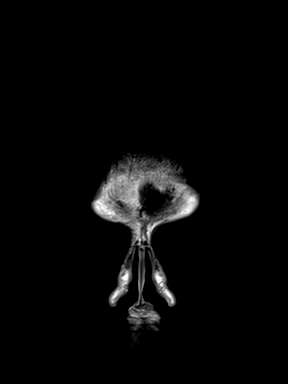

[Series 19: T1 post-contrast · axial · 1.0mm · 0.98mm/px · z∈[-96,+75]mm · 10 of 175 slices shown (2 of 2)]
[im 1/175]
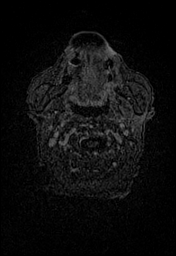
[im 20/175]
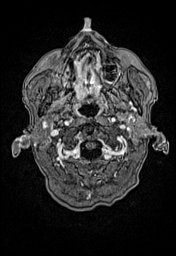
[im 39/175]
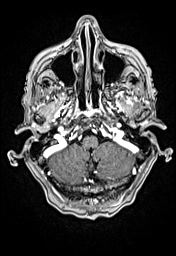
[im 59/175]
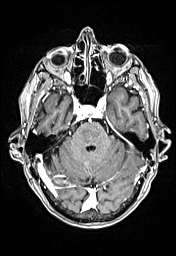
[im 78/175]
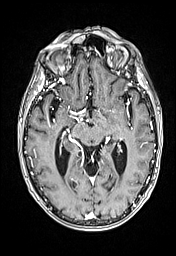
[im 97/175]
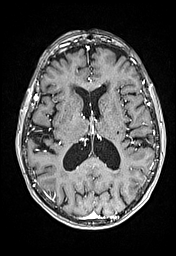
[im 117/175]
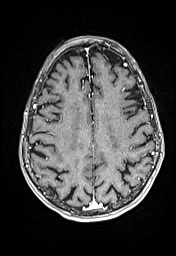
[im 136/175]
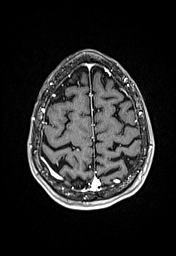
[im 155/175]
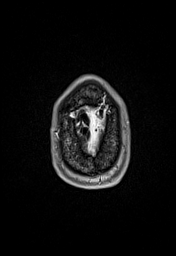
[im 175/175]
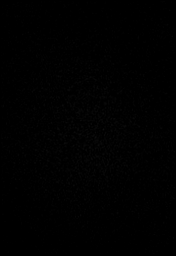

[48 of 48 positions shown; findings below may reference images not displayed]

FINDINGS: Brain: There is no acute infarction or intracranial hemorrhage.
There is no intracranial mass, mass effect, or edema. There is no
hydrocephalus or extra-axial fluid collection. Prominence of the
ventricles and sulci reflects parenchymal volume loss. Patchy and
confluent T2 hyperintensity in the supratentorial and pontine white
matter is nonspecific but may reflect moderate chronic microvascular
ischemic changes. No abnormal enhancement.

Vascular: Major vessel flow voids at the skull base are preserved.

Skull and upper cervical spine: Normal marrow signal is preserved.

Sinuses/Orbits: Paranasal sinuses are aerated. Bilateral lens
replacements.

Other: Sella is unremarkable.  Mastoid air cells are clear.
IMPRESSION: No evidence of recent infarction, hemorrhage, or mass. No abnormal
enhancement.

Moderate chronic microvascular ischemic changes.

## 2022-06-03 ENCOUNTER — Encounter
Admission: RE | Admit: 2022-06-03 | Discharge: 2022-06-03 | Disposition: A | Discharge status: Home / Self Care | Payer: Medicare Other | Source: Ambulatory Visit | Attending: Pulmonary Disease | Admitting: Pulmonary Disease

## 2022-06-03 DIAGNOSIS — I2510 Atherosclerotic heart disease of native coronary artery without angina pectoris: Secondary | ICD-10-CM | POA: Diagnosis not present

## 2022-06-03 DIAGNOSIS — R911 Solitary pulmonary nodule: Secondary | ICD-10-CM | POA: Insufficient documentation

## 2022-06-03 DIAGNOSIS — J432 Centrilobular emphysema: Secondary | ICD-10-CM | POA: Diagnosis not present

## 2022-06-03 DIAGNOSIS — M47814 Spondylosis without myelopathy or radiculopathy, thoracic region: Secondary | ICD-10-CM | POA: Diagnosis not present

## 2022-06-03 DIAGNOSIS — M47816 Spondylosis without myelopathy or radiculopathy, lumbar region: Secondary | ICD-10-CM | POA: Diagnosis not present

## 2022-06-03 LAB — GLUCOSE, CAPILLARY: Glucose-Capillary: 88 mg/dL (ref 70–99)

## 2022-06-03 MED ORDER — FLUDEOXYGLUCOSE F - 18 (FDG) INJECTION
5.2000 | Freq: Once | INTRAVENOUS | Status: AC | PRN
  Administered 2022-06-03: 5.53 via INTRAVENOUS

## 2022-06-07 DIAGNOSIS — H9203 Otalgia, bilateral: Secondary | ICD-10-CM | POA: Diagnosis not present

## 2022-06-07 DIAGNOSIS — H6123 Impacted cerumen, bilateral: Secondary | ICD-10-CM | POA: Diagnosis not present

## 2022-06-08 DIAGNOSIS — C3411 Malignant neoplasm of upper lobe, right bronchus or lung: Secondary | ICD-10-CM | POA: Diagnosis not present

## 2022-06-11 DIAGNOSIS — B0052 Herpesviral keratitis: Secondary | ICD-10-CM | POA: Diagnosis not present

## 2022-06-15 ENCOUNTER — Ambulatory Visit
Admission: RE | Admit: 2022-06-15 | Discharge: 2022-06-15 | Disposition: A | Discharge status: Home / Self Care | Payer: Medicare Other | Source: Ambulatory Visit | Attending: Radiation Oncology | Admitting: Radiation Oncology

## 2022-06-15 ENCOUNTER — Encounter: Payer: Self-pay | Admitting: Internal Medicine

## 2022-06-15 ENCOUNTER — Ambulatory Visit (INDEPENDENT_AMBULATORY_CARE_PROVIDER_SITE_OTHER): Payer: Medicare Other | Admitting: Internal Medicine

## 2022-06-15 ENCOUNTER — Encounter: Payer: Self-pay | Admitting: Radiation Oncology

## 2022-06-15 ENCOUNTER — Other Ambulatory Visit: Payer: Self-pay | Admitting: *Deleted

## 2022-06-15 VITALS — BP 161/89 | HR 69 | Temp 97.3°F | Resp 12 | Ht 60.0 in | Wt 97.2 lb

## 2022-06-15 DIAGNOSIS — Z79899 Other long term (current) drug therapy: Secondary | ICD-10-CM | POA: Insufficient documentation

## 2022-06-15 DIAGNOSIS — N182 Chronic kidney disease, stage 2 (mild): Secondary | ICD-10-CM | POA: Insufficient documentation

## 2022-06-15 DIAGNOSIS — M810 Age-related osteoporosis without current pathological fracture: Secondary | ICD-10-CM | POA: Diagnosis not present

## 2022-06-15 DIAGNOSIS — E785 Hyperlipidemia, unspecified: Secondary | ICD-10-CM | POA: Insufficient documentation

## 2022-06-15 DIAGNOSIS — M069 Rheumatoid arthritis, unspecified: Secondary | ICD-10-CM | POA: Insufficient documentation

## 2022-06-15 DIAGNOSIS — R011 Cardiac murmur, unspecified: Secondary | ICD-10-CM | POA: Diagnosis not present

## 2022-06-15 DIAGNOSIS — I700 Atherosclerosis of aorta: Secondary | ICD-10-CM | POA: Insufficient documentation

## 2022-06-15 DIAGNOSIS — I7300 Raynaud's syndrome without gangrene: Secondary | ICD-10-CM | POA: Diagnosis not present

## 2022-06-15 DIAGNOSIS — R911 Solitary pulmonary nodule: Secondary | ICD-10-CM

## 2022-06-15 DIAGNOSIS — I10 Essential (primary) hypertension: Secondary | ICD-10-CM

## 2022-06-15 DIAGNOSIS — R918 Other nonspecific abnormal finding of lung field: Secondary | ICD-10-CM

## 2022-06-15 DIAGNOSIS — I2510 Atherosclerotic heart disease of native coronary artery without angina pectoris: Secondary | ICD-10-CM | POA: Insufficient documentation

## 2022-06-15 DIAGNOSIS — C3490 Malignant neoplasm of unspecified part of unspecified bronchus or lung: Secondary | ICD-10-CM

## 2022-06-15 DIAGNOSIS — K219 Gastro-esophageal reflux disease without esophagitis: Secondary | ICD-10-CM | POA: Diagnosis not present

## 2022-06-15 DIAGNOSIS — I208 Other forms of angina pectoris: Secondary | ICD-10-CM

## 2022-06-15 DIAGNOSIS — I998 Other disorder of circulatory system: Secondary | ICD-10-CM | POA: Diagnosis not present

## 2022-06-15 DIAGNOSIS — C3411 Malignant neoplasm of upper lobe, right bronchus or lung: Secondary | ICD-10-CM | POA: Diagnosis not present

## 2022-06-15 DIAGNOSIS — I129 Hypertensive chronic kidney disease with stage 1 through stage 4 chronic kidney disease, or unspecified chronic kidney disease: Secondary | ICD-10-CM | POA: Insufficient documentation

## 2022-06-15 MED ORDER — AMLODIPINE BESYLATE 2.5 MG PO TABS: 2.5000 mg | ORAL_TABLET | Freq: Every day | ORAL | 0 refills | Status: DC

## 2022-06-15 MED ORDER — ALPRAZOLAM 0.25 MG PO TABS: 0.1250 mg | ORAL_TABLET | ORAL | 0 refills | Status: DC | PRN

## 2022-06-15 NOTE — Patient Instructions (Signed)
  I have sent 2 medications to your pharmacy to fill:  Alprazolam (for blood pressure elevation related to anxiety).  Take 1/2 tablet as needed   Amlodipine :  take once daily as needed for blood pressure > 150

## 2022-06-15 NOTE — Assessment & Plan Note (Signed)
Right upper lobe 1cm mildy hyperactive nodule, With post obstructive features, - concerning for primary bronchogenic carcinoma however cannot rule out infectiious focus.   patient is s/p full scope of antimicrobial therapy and is clinically improved "whole lot better".  - s/p PET CT and plan for consultation with radiation oncology regarding SBRT vs obserbation and repeat imaging.

## 2022-06-15 NOTE — Assessment & Plan Note (Addendum)
At last visit in July she was advised to o add amlodipine 2.5 mg daily for continued readings of SBP > 150 but has not had to use it.  rx given , along with alprazolam to use if anxiety is playing a role.

## 2022-06-15 NOTE — Consult Note (Signed)
NEW PATIENT EVALUATION  Name: [name]  MRN: [id number]  Date:   06/15/2022     DOB: [birth year]   This 88 y.o. female patient presents to the clinic for initial evaluation of probable stage I non-small cell lung cancer of the right upper lobe.  REFERRING PHYSICIAN: Crecencio Mc, MD  CHIEF COMPLAINT:  Chief Complaint  Patient presents with   Lung Cancer    DIAGNOSIS: The encounter diagnosis was Lung nodule.   PREVIOUS INVESTIGATIONS:  CT scans PET CT scans reviewed Clinical notes reviewed Labs reviewed  HPI: Patient is a pleasant 87 year old female whose husband treated over 30 years ago.  She has multiple comorbidities including history of CAD accelerated hypertension.  She was recently hospitalized secondary to left-sided chest pain and dizziness.  At time of admission she had incidentally was found of a 2.6 cm right upper lobe mass as well as postobstructive pneumonia of the right upper lobe.  CT scan showed again postobstructive changes in the right upper lobe and a 1 x 0.7 cm right upper lobe mass concerning for malignancy.  PET CT scan which was performed showing low-level metabolic activity along the central aspect of the right upper lobe.  There was no evidence of hypermetabolic adenopathy or distant metastatic disease.  She is now referred to ration collagen for consideration of treatment.  She is otherwise doing well specifically denies cough hemoptysis or chest tightness.  PLANNED TREATMENT REGIMEN: Observation with repeat CT scan in 3 months  PAST MEDICAL HISTORY:  has a past medical history of Allergy, Arthritis, Atherosclerosis, Atherosclerosis of abdominal aorta ([place]), Chronic kidney disease, CKD (chronic kidney disease) stage 2, GFR 60-89 ml/min, Collagen vascular disease ([place]), Compression fracture of thoracic spine, non-traumatic ([place]) (08/19/2015), Diverticulosis (2008), GERD (gastroesophageal reflux disease), Heart murmur, Hyperlipidemia, Lumbar spinal  stenosis, Osteoporosis, Ovarian lump, RA (rheumatoid arthritis) ([place]), Raynaud's disease, Rheumatoid arthritis ([place]), and Spinal stenosis.    PAST SURGICAL HISTORY:  Past Surgical History:  Procedure Laterality Date   BREAST SURGERY Left 1965   Benign biopsy   CHOLECYSTECTOMY  2005   [place]   COLONOSCOPY  2008   Dr. Donnella Sham   EXCISION OF BREAST BIOPSY Left 1965   benign   EYE SURGERY Bilateral    Cataract Extraction with IOL   KYPHOPLASTY N/A 12/17/2016   Procedure: KYPHOPLASTY;  Surgeon: Hessie Knows, MD;  Location: [facility];  Service: Orthopedics;  Laterality: N/A;  l4   LUMBAR LAMINECTOMY  05/09/2017   L2-L5   VISCERAL ANGIOGRAPHY N/A 08/15/2017   Procedure: VISCERAL ANGIOGRAPHY;  Surgeon: Algernon Huxley, MD;  Location: [place];  Service: Cardiovascular;  Laterality: N/A;   VISCERAL ANGIOGRAPHY N/A 10/05/2017   Procedure: VISCERAL ANGIOGRAPHY;  Surgeon: Algernon Huxley, MD;  Location: [facility];  Service: Cardiovascular;  Laterality: N/A;   VISCERAL ARTERY INTERVENTION N/A 08/15/2017   Procedure: VISCERAL ARTERY INTERVENTION;  Surgeon: Algernon Huxley, MD;  Location: [facility];  Service: Cardiovascular;  Laterality: N/A;    FAMILY HISTORY: family history includes Alcohol abuse in her sister; Cancer in her brother and brother; Diabetes in her brother, maternal grandmother, and sister; Heart disease in her maternal grandmother and mother; Heart disease (age of onset: 69) in her brother; Hypertension in her father; Kidney disease in her sister; Stroke in her brother.  SOCIAL HISTORY:  reports that she has never smoked. She has never used smokeless tobacco. She reports that she does not drink alcohol and does not use  drugs.  ALLERGIES: Ibandronic acid, Other, Prednisone, Actonel [risedronate], Amoxicillin, Pantoprazole, Raloxifene, Versed [midazolam], and Doxycycline  MEDICATIONS:  Current Outpatient Medications  Medication Sig Dispense Refill    acetaminophen (TYLENOL) 500 MG tablet Take 500 mg by mouth every 6 (six) hours as needed for mild pain.     Alfalfa 500 MG TABS Take 500 mg by mouth daily.     Ascorbic Acid (VITAMIN C CR) 500 MG CPCR Take 500 mg by mouth daily.     Calcium Carbonate (CALCIUM 600 PO) Take by mouth.     Cholecalciferol (VITAMIN D3) 3000 UNITS TABS Take 3,000 Units by mouth 2 (two) times daily.      clotrimazole-betamethasone (LOTRISONE) cream Apply topically 2 (two) times daily. 30 g 1   diclofenac sodium (VOLTAREN) 1 % GEL Apply topically as needed.      fluticasone (FLONASE) 50 MCG/ACT nasal spray Place 1 spray into both nostrils daily.     Ginkgo Biloba (GNP GINGKO BILOBA EXTRACT PO) Take 1 tablet by mouth daily.     Glucosamine Sulfate 500 MG CAPS Take 500 mg by mouth daily.     hydrALAZINE (APRESOLINE) 10 MG tablet Take 10 mg by mouth as needed.     isosorbide mononitrate (IMDUR) 30 MG 24 hr tablet Take 1 tablet (30 mg total) by mouth daily. 30 tablet 3   Lactobacillus (PROBIOTIC ACIDOPHILUS PO) Take 1 capsule by mouth daily.      loratadine (CLARITIN) 10 MG tablet Take 10 mg by mouth daily as needed for allergies.     Magnesium 400 MG CAPS Take 400 mg by mouth daily.     metoprolol succinate (TOPROL XL) 25 MG 24 hr tablet Take 0.5 tablets (12.5 mg total) by mouth daily. 45 tablet 2   Multiple Vitamin (MULTIVITAMIN) capsule Take 1 capsule by mouth daily.     mupirocin ointment (BACTROBAN) 2 % Apply to right leg wound daily - a small dab as needed once a day. 15 g 0   nitroGLYCERIN (NITROSTAT) 0.4 MG SL tablet DISSOLVE ONE TABLET UNDER THE TONGUE EVERY 5 MINUTES AS NEEDED FOR CHEST PAIN.  DO NOT EXCEED A TOTAL OF 3 DOSES IN 15 MINUTES 50 tablet 0   Omega-3 Fatty Acids (OMEGA 3 PO) Take 520 mg by mouth daily.      Pramoxine-HC (HYDROCORTISONE ACE-PRAMOXINE) 2.5-1 % CREA Apply topically as needed.      vitamin B-12 (CYANOCOBALAMIN) 1000 MCG tablet Take 1,000 mcg by mouth daily.     No current  facility-administered medications for this encounter.    ECOG PERFORMANCE STATUS:  0 - Asymptomatic  REVIEW OF SYSTEMS: Patient has CAD status post PA CI history of accelerated hypertension Patient denies any weight loss, fatigue, weakness, fever, chills or night sweats. Patient denies any loss of vision, blurred vision. Patient denies any ringing  of the ears or hearing loss. No irregular heartbeat. Patient denies heart murmur or history of fainting. Patient denies any chest pain or pain radiating to her upper extremities. Patient denies any shortness of breath, difficulty breathing at night, cough or hemoptysis. Patient denies any swelling in the lower legs. Patient denies any nausea vomiting, vomiting of blood, or coffee ground material in the vomitus. Patient denies any stomach pain. Patient states has had normal bowel movements no significant constipation or diarrhea. Patient denies any dysuria, hematuria or significant nocturia. Patient denies any problems walking, swelling in the joints or loss of balance. Patient denies any skin changes, loss of hair or loss of  weight. Patient denies any excessive worrying or anxiety or significant depression. Patient denies any problems with insomnia. Patient denies excessive thirst, polyuria, polydipsia. Patient denies any swollen glands, patient denies easy bruising or easy bleeding. Patient denies any recent infections, allergies or URI. Patient "s visual fields have not changed significantly in recent time.   PHYSICAL EXAM: BP (!) 161/89 (BP Location: Left Arm, Patient Position: Sitting, Cuff Size: Small)   Pulse 69   Temp (!) 97.3 F (36.3 C) (Tympanic)   Resp 12   Ht 5' (1.524 m) Comment: stated ht  Wt 97 lb 3.2 oz (44.1 kg)   BMI 18.98 kg/m  Thin slightly frail female in NAD.  Well-developed well-nourished patient in NAD. HEENT reveals PERLA, EOMI, discs not visualized.  Oral cavity is clear. No oral mucosal lesions are identified. Neck is clear  without evidence of cervical or supraclavicular adenopathy. Lungs are clear to A&P. Cardiac examination is essentially unremarkable with regular rate and rhythm without murmur rub or thrill. Abdomen is benign with no organomegaly or masses noted. Motor sensory and DTR levels are equal and symmetric in the upper and lower extremities. Cranial nerves II through XII are grossly intact. Proprioception is intact. No peripheral adenopathy or edema is identified. No motor or sensory levels are noted. Crude visual fields are within normal range.  LABORATORY DATA: Labs reviewed    RADIOLOGY RESULTS: CT scan and PET CT scan reviewed follow-upCT scan in 3 months ordered   IMPRESSION: Probable stage I non-small cell lung cancer favoring adeno of the right upper lobe in 88 year old female  PLAN: This time I would like to observe this lesion with a repeat CT scan in 3 months.  Should this show progression at that time would offer SBRT to this lesion.  I have discussed this with the patient and her daughters and they all seem to comprehend my recommendations well.  Follow-up in 3 months with a CT scan of her chest prior to that visit was ordered.  I would like to take this opportunity to thank you for allowing me to participate in the care of your patient.Noreene Filbert, MD

## 2022-06-15 NOTE — Progress Notes (Signed)
Subjective:  Patient ID: [name], female    DOB: [birth year]  Age: 88 y.o. MRN: [id number]  CC: Diagnoses of Lung mass and Accelerated hypertension were pertinent to this visit.   HPI [name] presents for follow up on multiple issues:   1) Lung mass:  she was found during ER visit for hypertensive urgeney to have a RUL endobronchial  obstructive mass.   Recent noninvasive workup by pulmonology reviewed.  PET scan report reviewed,  done July 27:  "Low level metabolic activity along the central aspect of the right upper lobe process demonstrated recently. Findings could be secondary to posterior obstructive pneumonitis from indolent neoplasm such as carcinoid tumor. Consider bronchoscopic assessment or short-term CT follow-up. 2. No hypermetabolic adenopathy or distant metastatic disease. 3. Coronary and aortic atherosclerosis (ICD10-I70.0). Emphysema (ICD10-J43.9)."  she followed up with Dr Lanney Gins on August 1 and had her initial visit with Dr Baruch Gouty , radiation oncology , today.  The plan is to repeat the CT in 3 months.  She had 3 days of feeling "off' after the PET scan   2) Hypertension:  At last visit in July she was advised to o add amlodipine 2.5 mg daily for continued readings of SBP > 150 and continue metoprolol and Imdur    Outpatient Medications Prior to Visit  Medication Sig Dispense Refill   acetaminophen (TYLENOL) 500 MG tablet Take 500 mg by mouth every 6 (six) hours as needed for mild pain.     Alfalfa 500 MG TABS Take 500 mg by mouth daily.     Ascorbic Acid (VITAMIN C CR) 500 MG CPCR Take 500 mg by mouth daily.     Calcium Carbonate (CALCIUM 600 PO) Take by mouth.     Cholecalciferol (VITAMIN D3) 3000 UNITS TABS Take 3,000 Units by mouth 2 (two) times daily.      clotrimazole-betamethasone (LOTRISONE) cream Apply topically 2 (two) times daily. 30 g 1   diclofenac sodium (VOLTAREN) 1 % GEL Apply topically as needed.      fluticasone (FLONASE) 50  MCG/ACT nasal spray Place 1 spray into both nostrils daily.     Ginkgo Biloba (GNP GINGKO BILOBA EXTRACT PO) Take 1 tablet by mouth daily.     Glucosamine Sulfate 500 MG CAPS Take 500 mg by mouth daily.     hydrALAZINE (APRESOLINE) 10 MG tablet Take 10 mg by mouth as needed.     isosorbide mononitrate (IMDUR) 30 MG 24 hr tablet Take 1 tablet (30 mg total) by mouth daily. 30 tablet 3   Lactobacillus (PROBIOTIC ACIDOPHILUS PO) Take 1 capsule by mouth daily.      loratadine (CLARITIN) 10 MG tablet Take 10 mg by mouth daily as needed for allergies.     Magnesium 400 MG CAPS Take 400 mg by mouth daily.     metoprolol succinate (TOPROL XL) 25 MG 24 hr tablet Take 0.5 tablets (12.5 mg total) by mouth daily. 45 tablet 2   Multiple Vitamin (MULTIVITAMIN) capsule Take 1 capsule by mouth daily.     mupirocin ointment (BACTROBAN) 2 % Apply to right leg wound daily - a small dab as needed once a day. 15 g 0   nitroGLYCERIN (NITROSTAT) 0.4 MG SL tablet DISSOLVE ONE TABLET UNDER THE TONGUE EVERY 5 MINUTES AS NEEDED FOR CHEST PAIN.  DO NOT EXCEED A TOTAL OF 3 DOSES IN 15 MINUTES 50 tablet 0   Omega-3 Fatty Acids (OMEGA 3 PO) Take 520 mg by mouth daily.  Pramoxine-HC (HYDROCORTISONE ACE-PRAMOXINE) 2.5-1 % CREA Apply topically as needed.      valACYclovir (VALTREX) 500 MG tablet Take by mouth.     vitamin B-12 (CYANOCOBALAMIN) 1000 MCG tablet Take 1,000 mcg by mouth daily.     No facility-administered medications prior to visit.    Review of Systems;  Patient denies headache, fevers, malaise, unintentional weight loss, skin rash, eye pain, sinus congestion and sinus pain, sore throat, dysphagia,  hemoptysis , cough, dyspnea, wheezing, chest pain, palpitations, orthopnea, edema, abdominal pain, nausea, melena, diarrhea, constipation, flank pain, dysuria, hematuria, urinary  Frequency, nocturia, numbness, tingling, seizures,  Focal weakness, Loss of consciousness,  Tremor, insomnia, depression, anxiety, and  suicidal ideation.      Objective:  BP 118/70 (BP Location: Left Arm, Patient Position: Sitting, Cuff Size: Normal)   Pulse 62   Temp 98.1 F (36.7 C) (Oral)   Ht 5' (1.524 m)   Wt 97 lb 6.4 oz (44.2 kg)   SpO2 97%   BMI 19.02 kg/m   BP Readings from Last 3 Encounters:  06/15/22 118/70  06/15/22 (!) 161/89  05/17/22 138/74    Wt Readings from Last 3 Encounters:  06/15/22 97 lb 6.4 oz (44.2 kg)  06/15/22 97 lb 3.2 oz (44.1 kg)  05/17/22 98 lb (44.5 kg)    General appearance: alert, cooperative and appears stated age Ears: normal TM's and external ear canals both ears Throat: lips, mucosa, and tongue normal; teeth and gums normal Neck: no adenopathy, no carotid bruit, supple, symmetrical, trachea midline and thyroid not enlarged, symmetric, no tenderness/mass/nodules Back: symmetric, no curvature. ROM normal. No CVA tenderness. Lungs: clear to auscultation bilaterally Heart: regular rate and rhythm, S1, S2 normal, no murmur, click, rub or gallop Abdomen: soft, non-tender; bowel sounds normal; no masses,  no organomegaly Pulses: 2+ and symmetric Skin: Skin color, texture, turgor normal. No rashes or lesions Lymph nodes: Cervical, supraclavicular, and axillary nodes normal.  Lab Results  Component Value Date   HGBA1C 7.0 (H) 11/16/2021   HGBA1C 6.1 (H) 05/14/2019   HGBA1C 6.2 (H) 01/01/2019    Lab Results  Component Value Date   CREATININE 0.72 05/05/2022   CREATININE 0.47 05/04/2022   CREATININE 0.44 05/03/2022    Lab Results  Component Value Date   WBC 5.6 05/05/2022   HGB 14.7 05/05/2022   HCT 44.5 05/05/2022   PLT 178 05/05/2022   GLUCOSE 95 05/05/2022   CHOL 204 (H) 11/16/2021   TRIG 127.0 11/16/2021   HDL 68.10 11/16/2021   LDLCALC 110 (H) 11/16/2021   ALT 24 05/04/2022   AST 44 (H) 05/04/2022   NA 139 05/05/2022   K 4.4 05/05/2022   CL 105 05/05/2022   CREATININE 0.72 05/05/2022   BUN 15 05/05/2022   CO2 25 05/05/2022   TSH 1.433 06/12/2021    HGBA1C 7.0 (H) 11/16/2021   MICROALBUR <0.7 11/16/2021    NM PET Image Initial (PI) Skull Base To Thigh (F-18 FDG)  Result Date: 06/03/2022 CLINICAL DATA:  Initial treatment strategy for right upper lobe pulmonary nodule on CTA. EXAM: NUCLEAR MEDICINE PET SKULL BASE TO THIGH TECHNIQUE: 5.53 mCi F-18 FDG was injected intravenously. Full-ring PET imaging was performed from the skull base to thigh after the radiotracer. CT data was obtained and used for attenuation correction and anatomic localization. Fasting blood glucose: 88 mg/dl COMPARISON:  CT of the chest, abdomen and pelvis 05/04/2022. Abdominopelvic CT 02/18/2021. Chest CTA 03/25/2020. FINDINGS: Mediastinal blood pool activity: SUV max 1.5 NECK: No hypermetabolic  cervical lymph nodes are identified.There are no lesions of the pharyngeal mucosal space. Incidental CT findings: Bilateral carotid atherosclerosis. CHEST: There are no hypermetabolic mediastinal, hilar or axillary lymph nodes. The ill-defined right upper lobe nodular density is unchanged in appearance from the CT of 1 month ago. Central nodular component measures approximately 1.1 x 1.0 cm on image 74/2 and is not clearly endobronchial. This demonstrates low level metabolic activity (SUV max 2.2). There is also mild metabolic activity within the peripheral aspect of the right upper lobe density (SUV max 1.9). No other hypermetabolic pulmonary activity or suspicious nodularity. Incidental CT findings: Mild centrilobular emphysema. Diffuse atherosclerosis of the aorta, great vessels and coronary arteries. ABDOMEN/PELVIS: There is no hypermetabolic activity within the liver, adrenal glands, spleen or pancreas. There is no hypermetabolic nodal activity. Incidental CT findings: Diffuse aortic and branch vessel atherosclerosis. Distal colonic diverticular changes. Previous cholecystectomy. SKELETON: There is no hypermetabolic activity to suggest osseous metastatic disease. Incidental CT findings:  Multilevel spondylosis with grossly stable compression deformities at T11 and L2. Previous L4 spinal augmentation. IMPRESSION: 1. Low level metabolic activity along the central aspect of the right upper lobe process demonstrated recently. Findings could be secondary to posterior obstructive pneumonitis from indolent neoplasm such as carcinoid tumor. Consider bronchoscopic assessment or short-term CT follow-up. 2. No hypermetabolic adenopathy or distant metastatic disease. 3. Coronary and aortic atherosclerosis (ICD10-I70.0). Emphysema (ICD10-J43.9). Electronically Signed   By: Richardean Sale M.D.   On: 06/03/2022 15:00    Assessment & Plan:   Problem List Items Addressed This Visit     Lung mass    Right upper lobe 1cm mildy hyperactive nodule, With post obstructive features, - concerning for primary bronchogenic carcinoma however cannot rule out infectiious focus.   patient is s/p full scope of antimicrobial therapy and is clinically improved "whole lot better".  - s/p PET CT and plan for consultation with radiation oncology regarding SBRT vs obserbation and repeat imaging.        Essential hypertension    At last visit in July she was advised to o add amlodipine 2.5 mg daily for continued readings of SBP > 150 but has not had to use it.  rx given , along with alprazolam to use if anxiety is playing a role.       Relevant Medications   amLODipine (NORVASC) 2.5 MG tablet    I spent a total of  30 minutes with this patient in a face to face visit on the date of this encounter reviewing the last office visit with me in July , her most recent visit with her pulmonologist,  home blood pressure readings ,  most recent imaging study ,   and post visit ordering of testing and therapeutics.    Follow-up: Return in about 3 months (around 09/15/2022).   Crecencio Mc, MD

## 2022-06-18 DIAGNOSIS — B0052 Herpesviral keratitis: Secondary | ICD-10-CM | POA: Diagnosis not present

## 2022-06-28 ENCOUNTER — Ambulatory Visit (INDEPENDENT_AMBULATORY_CARE_PROVIDER_SITE_OTHER): Payer: Medicare Other

## 2022-06-28 ENCOUNTER — Encounter (INDEPENDENT_AMBULATORY_CARE_PROVIDER_SITE_OTHER): Payer: Self-pay | Admitting: Nurse Practitioner

## 2022-06-28 ENCOUNTER — Ambulatory Visit (INDEPENDENT_AMBULATORY_CARE_PROVIDER_SITE_OTHER): Payer: Medicare Other | Admitting: Nurse Practitioner

## 2022-06-28 VITALS — BP 168/69 | HR 50 | Resp 17 | Ht 60.0 in | Wt 97.0 lb

## 2022-06-28 DIAGNOSIS — I10 Essential (primary) hypertension: Secondary | ICD-10-CM

## 2022-06-28 DIAGNOSIS — E782 Mixed hyperlipidemia: Secondary | ICD-10-CM | POA: Diagnosis not present

## 2022-06-28 DIAGNOSIS — I771 Stricture of artery: Secondary | ICD-10-CM

## 2022-07-02 DIAGNOSIS — B0052 Herpesviral keratitis: Secondary | ICD-10-CM | POA: Diagnosis not present

## 2022-07-05 DIAGNOSIS — H16223 Keratoconjunctivitis sicca, not specified as Sjogren's, bilateral: Secondary | ICD-10-CM | POA: Diagnosis not present

## 2022-07-12 ENCOUNTER — Encounter (INDEPENDENT_AMBULATORY_CARE_PROVIDER_SITE_OTHER): Payer: Self-pay | Admitting: Vascular Surgery

## 2022-07-18 ENCOUNTER — Encounter (INDEPENDENT_AMBULATORY_CARE_PROVIDER_SITE_OTHER): Payer: Self-pay | Admitting: Nurse Practitioner

## 2022-07-18 NOTE — Progress Notes (Signed)
Subjective:    Patient ID: [name], female    DOB: 1927-04-01, 88 y.o.   MRN: [id number] No chief complaint on file.   Patient returns today in follow up of her known mesenteric artery disease.  The patient continues to have some epigastric pain that she has had for some time now.  Her symptoms are vague and difficult to discern whether or not they are truly from ischemic issues but they do not sound to be.  She is currently eating well and her weight is stable.  She has had no major changes or problems since her last visit other than her chronic issues.  Previously placed stents in the celiac artery are patent.  Mesenteric artery vessel velocities are normal as well as splenic and hepatic artery findings.  The velocities are very mildly increased but no significant stenosis noted within the vessel.    Review of Systems  Gastrointestinal:  Positive for abdominal pain.  All other systems reviewed and are negative.      Objective:   Physical Exam Vitals reviewed.  HENT:     Head: Normocephalic.  Cardiovascular:     Rate and Rhythm: Normal rate.  Pulmonary:     Effort: Pulmonary effort is normal.  Skin:    General: Skin is warm and dry.  Neurological:     Mental Status: She is alert and oriented to person, place, and time.  Psychiatric:        Mood and Affect: Mood normal.        Behavior: Behavior normal.        Thought Content: Thought content normal.        Judgment: Judgment normal.     BP (!) 168/69 (BP Location: Right Arm)   Pulse (!) 50   Resp 17   Ht 5' (1.524 m)   Wt 97 lb (44 kg)   BMI 18.94 kg/m   Past Medical History:  Diagnosis Date   Allergy    Arthritis    Atherosclerosis    Atherosclerosis of abdominal aorta (HCC)    Chronic kidney disease    stage 2   CKD (chronic kidney disease) stage 2, GFR 60-89 ml/min    Collagen vascular disease (HCC)    Compression fracture of thoracic spine, non-traumatic (HCC) 08/19/2015   Seen on plain  films October 2016   Diverticulosis 2008   GERD (gastroesophageal reflux disease)    Heart murmur    Hyperlipidemia    Hypertensive urgency 05/04/2022   Lumbar spinal stenosis    Osteoporosis    Ovarian lump    RA (rheumatoid arthritis) (HCC)    Raynaud's disease    Rheumatoid arthritis (HCC)    Spinal stenosis     Social History   Socioeconomic History   Marital status: Widowed    Spouse name: Gwyndolyn Saxon   Number of children: 3   Years of education: some college   Highest education level: 12th grade  Occupational History    Employer: RETIRED  Tobacco Use   Smoking status: Never   Smokeless tobacco: Never   Tobacco comments:    smoking cessation materials not required  Vaping Use   Vaping Use: Never used  Substance and Sexual Activity   Alcohol use: No    Alcohol/week: 0.0 standard drinks of alcohol   Drug use: No   Sexual activity: Not Currently  Other Topics Concern   Not on file  Social History Narrative   Not on file   Social  Determinants of Health   Financial Resource Strain: Low Risk  (05/17/2022)   Overall Financial Resource Strain (CARDIA)    Difficulty of Paying Living Expenses: Not hard at all  Food Insecurity: No Food Insecurity (05/17/2022)   Hunger Vital Sign    Worried About Running Out of Food in the Last Year: Never true    Ran Out of Food in the Last Year: Never true  Transportation Needs: No Transportation Needs (05/17/2022)   PRAPARE - Hydrologist (Medical): No    Lack of Transportation (Non-Medical): No  Physical Activity: Sufficiently Active (05/17/2022)   Exercise Vital Sign    Days of Exercise per Week: 7 days    Minutes of Exercise per Session: 30 min  Stress: No Stress Concern Present (05/17/2022)   [place]    Feeling of Stress : Not at all  Social Connections: Moderately Integrated (05/17/2022)   Social Connection and Isolation Panel [NHANES]     Frequency of Communication with Friends and Family: More than three times a week    Frequency of Social Gatherings with Friends and Family: Three times a week    Attends Religious Services: More than 4 times per year    Active Member of Clubs or Organizations: Yes    Attends Archivist Meetings: More than 4 times per year    Marital Status: Widowed  Intimate Partner Violence: Not At Risk (05/17/2022)   Humiliation, Afraid, Rape, and Kick questionnaire    Fear of Current or Ex-Partner: No    Emotionally Abused: No    Physically Abused: No    Sexually Abused: No    Past Surgical History:  Procedure Laterality Date   BREAST SURGERY Left 1965   Benign biopsy   CHOLECYSTECTOMY  2005   [place]   COLONOSCOPY  2008   Dr. Donnella Sham   EXCISION OF BREAST BIOPSY Left 1965   benign   EYE SURGERY Bilateral    Cataract Extraction with IOL   KYPHOPLASTY N/A 12/17/2016   Procedure: KYPHOPLASTY;  Surgeon: Hessie Knows, MD;  Location: [facility];  Service: Orthopedics;  Laterality: N/A;  l4   LUMBAR LAMINECTOMY  05/09/2017   L2-L5   VISCERAL ANGIOGRAPHY N/A 08/15/2017   Procedure: VISCERAL ANGIOGRAPHY;  Surgeon: Algernon Huxley, MD;  Location: [place] [facility];  Service: Cardiovascular;  Laterality: N/A;   VISCERAL ANGIOGRAPHY N/A 10/05/2017   Procedure: VISCERAL ANGIOGRAPHY;  Surgeon: Algernon Huxley, MD;  Location: [place] [facility];  Service: Cardiovascular;  Laterality: N/A;   VISCERAL ARTERY INTERVENTION N/A 08/15/2017   Procedure: VISCERAL ARTERY INTERVENTION;  Surgeon: Algernon Huxley, MD;  Location: [place] [facility];  Service: Cardiovascular;  Laterality: N/A;    Family History  Problem Relation Age of Onset   Heart disease Mother    Hypertension Father    Heart disease Brother 41       heart attack   Cancer Brother        bladder   Diabetes Sister    Alcohol abuse Sister    Kidney disease Sister    Diabetes Brother    Stroke Brother    Diabetes Maternal Grandmother     Heart disease Maternal Grandmother    Cancer Brother        lung    Allergies  Allergen Reactions   Ibandronic Acid Other (See Comments)    Achy all over. Flu like S/S   Other  Other (See Comments)    Achy all over. Flu like S/S Dysphagia   Prednisone Rash   Actonel [Risedronate] Other (See Comments)    Dysphagia   Amoxicillin Hives   Pantoprazole Other (See Comments)    Little blistery bumps   Raloxifene Other (See Comments)    Mood swings   Versed [Midazolam] Other (See Comments)    Difficult waking up and memory loss   Doxycycline Rash       Latest Ref Rng & Units 05/05/2022    5:02 AM 05/04/2022   12:28 AM 05/03/2022    9:22 PM  CBC  WBC 4.0 - 10.5 K/uL 5.6  6.6  4.6   Hemoglobin 12.0 - 15.0 g/dL 14.7  16.3  14.1   Hematocrit 36.0 - 46.0 % 44.5  50.2  44.2   Platelets 150 - 400 K/uL 178  199  189       CMP     Component Value Date/Time   NA 139 05/05/2022 0502   NA 143 12/19/2015 1422   K 4.4 05/05/2022 0502   CL 105 05/05/2022 0502   CO2 25 05/05/2022 0502   GLUCOSE 95 05/05/2022 0502   BUN 15 05/05/2022 0502   BUN 15 12/19/2015 1422   CREATININE 0.72 05/05/2022 0502   CREATININE 0.62 01/26/2019 1542   CALCIUM 9.1 05/05/2022 0502   PROT 7.8 05/04/2022 0028   PROT 6.9 09/08/2021 1502   ALBUMIN 4.5 05/04/2022 0028   ALBUMIN 4.4 09/08/2021 1502   AST 44 (H) 05/04/2022 0028   ALT 24 05/04/2022 0028   ALKPHOS 53 05/04/2022 0028   BILITOT 0.4 05/04/2022 0028   BILITOT 0.2 09/08/2021 1502   GFRNONAA >60 05/05/2022 0502   GFRNONAA 80 01/26/2019 1542   GFRAA >60 05/22/2020 1536   GFRAA 93 01/26/2019 1542     No results found.     Assessment & Plan:   1. Celiac artery stenosis (HCC) Duplex today shows the celiac artery stent to be widely patent with normal velocities in the superior mesenteric artery as well.  This intervention was originally done in 2018.  We have been doing 81-month follow-up intervals and we will continue that for now.  Continue  current medical regimen.  2. Mixed hyperlipidemia Currently no medication for hyperlipidemia.  We will continue to be followed by PCP  3. Essential hypertension Continue antihypertensive medications as already ordered, these medications have been reviewed and there are no changes at this time.    Current Outpatient Medications on File Prior to Visit  Medication Sig Dispense Refill   acetaminophen (TYLENOL) 500 MG tablet Take 500 mg by mouth every 6 (six) hours as needed for mild pain.     Alfalfa 500 MG TABS Take 500 mg by mouth daily.     ALPRAZolam (XANAX) 0.25 MG tablet Take 0.5 tablets (0.125 mg total) by mouth as needed for anxiety (for anxiety or elevated blood pressure). 20 tablet 0   amLODipine (NORVASC) 2.5 MG tablet Take 1 tablet (2.5 mg total) by mouth daily. As needed for systolic pressure > 401 90 tablet 0   Ascorbic Acid (VITAMIN C CR) 500 MG CPCR Take 500 mg by mouth daily.     Calcium Carbonate (CALCIUM 600 PO) Take by mouth.     Cholecalciferol (VITAMIN D3) 3000 UNITS TABS Take 3,000 Units by mouth 2 (two) times daily.      clotrimazole-betamethasone (LOTRISONE) cream Apply topically 2 (two) times daily. 30 g 1   diclofenac sodium (VOLTAREN) 1 %  GEL Apply topically as needed.      fluticasone (FLONASE) 50 MCG/ACT nasal spray Place 1 spray into both nostrils daily.     Ginkgo Biloba (GNP GINGKO BILOBA EXTRACT PO) Take 1 tablet by mouth daily.     Glucosamine Sulfate 500 MG CAPS Take 500 mg by mouth daily.     hydrALAZINE (APRESOLINE) 10 MG tablet Take 10 mg by mouth as needed.     isosorbide mononitrate (IMDUR) 30 MG 24 hr tablet Take 1 tablet (30 mg total) by mouth daily. 30 tablet 3   Lactobacillus (PROBIOTIC ACIDOPHILUS PO) Take 1 capsule by mouth daily.      loratadine (CLARITIN) 10 MG tablet Take 10 mg by mouth daily as needed for allergies.     Magnesium 400 MG CAPS Take 400 mg by mouth daily.     metoprolol succinate (TOPROL XL) 25 MG 24 hr tablet Take 0.5 tablets  (12.5 mg total) by mouth daily. 45 tablet 2   Multiple Vitamin (MULTIVITAMIN) capsule Take 1 capsule by mouth daily.     mupirocin ointment (BACTROBAN) 2 % Apply to right leg wound daily - a small dab as needed once a day. 15 g 0   nitroGLYCERIN (NITROSTAT) 0.4 MG SL tablet DISSOLVE ONE TABLET UNDER THE TONGUE EVERY 5 MINUTES AS NEEDED FOR CHEST PAIN.  DO NOT EXCEED A TOTAL OF 3 DOSES IN 15 MINUTES 50 tablet 0   Omega-3 Fatty Acids (OMEGA 3 PO) Take 520 mg by mouth daily.      Pramoxine-HC (HYDROCORTISONE ACE-PRAMOXINE) 2.5-1 % CREA Apply topically as needed.      No current facility-administered medications on file prior to visit.    There are no Patient Instructions on file for this visit. No follow-ups on file.   Kris Hartmann, NP

## 2022-08-11 DIAGNOSIS — M159 Polyosteoarthritis, unspecified: Secondary | ICD-10-CM | POA: Diagnosis not present

## 2022-08-11 DIAGNOSIS — R768 Other specified abnormal immunological findings in serum: Secondary | ICD-10-CM | POA: Diagnosis not present

## 2022-08-11 DIAGNOSIS — I7300 Raynaud's syndrome without gangrene: Secondary | ICD-10-CM | POA: Diagnosis not present

## 2022-08-13 ENCOUNTER — Encounter: Payer: Self-pay | Admitting: Cardiology

## 2022-08-13 ENCOUNTER — Ambulatory Visit: Payer: Medicare Other | Attending: Cardiology | Admitting: Cardiology

## 2022-08-13 VITALS — BP 128/66 | HR 58 | Ht 60.0 in | Wt 98.0 lb

## 2022-08-13 DIAGNOSIS — I493 Ventricular premature depolarization: Secondary | ICD-10-CM | POA: Diagnosis not present

## 2022-08-13 DIAGNOSIS — I10 Essential (primary) hypertension: Secondary | ICD-10-CM | POA: Diagnosis not present

## 2022-08-13 NOTE — Patient Instructions (Signed)
Medication Instructions:   Your physician recommends that you continue on your current medications as directed. Please refer to the Current Medication list given to you today.  *If you need a refill on your cardiac medications before your next appointment, please call your pharmacy*   Follow-Up: At [facility], you and your health needs are our priority.  As part of our continuing mission to provide you with exceptional heart care, we have created designated Provider Care Teams.  These Care Teams include your primary Cardiologist (physician) and Advanced Practice Providers (APPs -  Physician Assistants and Nurse Practitioners) who all work together to provide you with the care you need, when you need it.  We recommend signing up for the patient portal called "MyChart".  Sign up information is provided on this After Visit Summary.  MyChart is used to connect with patients for Virtual Visits (Telemedicine).  Patients are able to view lab/test results, encounter notes, upcoming appointments, etc.  Non-urgent messages can be sent to your provider as well.   To learn more about what you can do with MyChart, go to [email or web address].    Your next appointment:   1 year(s)  The format for your next appointment:   In Person  Provider:   Kate Sable, MD    Other Instructions   Important Information About Sugar

## 2022-08-13 NOTE — Progress Notes (Signed)
Cardiology Office Note:    Date:  08/13/2022   ID:  [name], DOB [birth year], MRN [id number]  PCP:  Crecencio Mc, MD  [facility] Cardiologist:  Kate Sable, MD  [facility] Electrophysiologist:  None   Referring MD: Crecencio Mc, MD   Chief Complaint  Patient presents with   Follow-up    12 month f/u, SOB, right arm pain    History of Present Illness:    [name] is a 88 y.o. female with a hx of hypertension, frequent PVCs, CKD, celiac artery stenosis s/p stenting who presents for follow-up.    Being seen for hypertension, frequent PVCs.  Toprol-XL was started with improvement in symptoms of palpitations.  Has noted spikes in blood pressure, usually associated with being worried or anxious.  Tolerating current medications as prescribed with no adverse effects.  Prior notes Cardiac monitor 06/2021 frequent PVCs 12.2% burden Echo 06/2021 EF 60 to 65%, impaired relaxation. Lexiscan Myoview 04/2020, no evidence for ischemia, low risk study.   Past Medical History:  Diagnosis Date   Allergy    Arthritis    Atherosclerosis    Atherosclerosis of abdominal aorta (HCC)    Chronic kidney disease    stage 2   CKD (chronic kidney disease) stage 2, GFR 60-89 ml/min    Collagen vascular disease (HCC)    Compression fracture of thoracic spine, non-traumatic ([place]) 08/19/2015   Seen on plain films October 2016   Diverticulosis 2008   GERD (gastroesophageal reflux disease)    Heart murmur    Hyperlipidemia    Hypertensive urgency 05/04/2022   Lumbar spinal stenosis    Osteoporosis    Ovarian lump    RA (rheumatoid arthritis) ([place])    Raynaud's disease    Rheumatoid arthritis ([place])    Spinal stenosis     Past Surgical History:  Procedure Laterality Date   BREAST SURGERY Left 1965   Benign biopsy   CHOLECYSTECTOMY  2005   [place]   COLONOSCOPY  2008   Dr. Donnella Sham   EXCISION OF BREAST BIOPSY Left 1965   benign   EYE SURGERY  Bilateral    Cataract Extraction with IOL   KYPHOPLASTY N/A 12/17/2016   Procedure: KYPHOPLASTY;  Surgeon: Hessie Knows, MD;  Location: [facility];  Service: Orthopedics;  Laterality: N/A;  l4   LUMBAR LAMINECTOMY  05/09/2017   L2-L5   VISCERAL ANGIOGRAPHY N/A 08/15/2017   Procedure: VISCERAL ANGIOGRAPHY;  Surgeon: Algernon Huxley, MD;  Location: [place] [facility];  Service: Cardiovascular;  Laterality: N/A;   VISCERAL ANGIOGRAPHY N/A 10/05/2017   Procedure: VISCERAL ANGIOGRAPHY;  Surgeon: Algernon Huxley, MD;  Location: [place] [facility];  Service: Cardiovascular;  Laterality: N/A;   VISCERAL ARTERY INTERVENTION N/A 08/15/2017   Procedure: VISCERAL ARTERY INTERVENTION;  Surgeon: Algernon Huxley, MD;  Location: [place] [facility];  Service: Cardiovascular;  Laterality: N/A;    Current Medications: Current Meds  Medication Sig   acetaminophen (TYLENOL) 500 MG tablet Take 500 mg by mouth every 6 (six) hours as needed for mild pain.   Alfalfa 500 MG TABS Take 500 mg by mouth daily.   Ascorbic Acid (VITAMIN C CR) 500 MG CPCR Take 500 mg by mouth daily.   Calcium Carbonate (CALCIUM 600 PO) Take by mouth.   Cholecalciferol (VITAMIN D3) 3000 UNITS TABS Take 3,000 Units by mouth 2 (two) times daily.    clotrimazole-betamethasone (LOTRISONE) cream Apply topically 2 (two) times daily.   fluticasone (  FLONASE) 50 MCG/ACT nasal spray Place 1 spray into both nostrils daily.   Ginkgo Biloba (GNP GINGKO BILOBA EXTRACT PO) Take 1 tablet by mouth daily.   Glucosamine Sulfate 500 MG CAPS Take 500 mg by mouth daily.   hydrALAZINE (APRESOLINE) 10 MG tablet Take 10 mg by mouth as needed.   isosorbide mononitrate (IMDUR) 30 MG 24 hr tablet Take 1 tablet (30 mg total) by mouth daily.   Lactobacillus (PROBIOTIC ACIDOPHILUS PO) Take 1 capsule by mouth daily.    loratadine (CLARITIN) 10 MG tablet Take 10 mg by mouth daily as needed for allergies.   Magnesium 400 MG CAPS Take 400 mg by mouth daily.   metoprolol  succinate (TOPROL XL) 25 MG 24 hr tablet Take 0.5 tablets (12.5 mg total) by mouth daily.   Multiple Vitamin (MULTIVITAMIN) capsule Take 1 capsule by mouth daily.   mupirocin ointment (BACTROBAN) 2 % Apply to right leg wound daily - a small dab as needed once a day.   Omega-3 Fatty Acids (OMEGA 3 PO) Take 520 mg by mouth daily.    Pramoxine-HC (HYDROCORTISONE ACE-PRAMOXINE) 2.5-1 % CREA Apply topically as needed.      Allergies:   Ibandronic acid, Other, Prednisone, Actonel [risedronate], Amoxicillin, Pantoprazole, Raloxifene, Versed [midazolam], and Doxycycline   Social History   Socioeconomic History   Marital status: Widowed    Spouse name: Gwyndolyn Saxon   Number of children: 3   Years of education: some college   Highest education level: 12th grade  Occupational History    Employer: RETIRED  Tobacco Use   Smoking status: Never   Smokeless tobacco: Never   Tobacco comments:    smoking cessation materials not required  Vaping Use   Vaping Use: Never used  Substance and Sexual Activity   Alcohol use: No    Alcohol/week: 0.0 standard drinks of alcohol   Drug use: No   Sexual activity: Not Currently  Other Topics Concern   Not on file  Social History Narrative   Not on file   Social Determinants of Health   Financial Resource Strain: Low Risk  (05/17/2022)   Overall Financial Resource Strain (CARDIA)    Difficulty of Paying Living Expenses: Not hard at all  Food Insecurity: No Food Insecurity (05/17/2022)   Hunger Vital Sign    Worried About Running Out of Food in the Last Year: Never true    Ran Out of Food in the Last Year: Never true  Transportation Needs: No Transportation Needs (05/17/2022)   PRAPARE - Hydrologist (Medical): No    Lack of Transportation (Non-Medical): No  Physical Activity: Sufficiently Active (05/17/2022)   Exercise Vital Sign    Days of Exercise per Week: 7 days    Minutes of Exercise per Session: 30 min  Stress: No Stress  Concern Present (05/17/2022)   [place]    Feeling of Stress : Not at all  Social Connections: Moderately Integrated (05/17/2022)   Social Connection and Isolation Panel [NHANES]    Frequency of Communication with Friends and Family: More than three times a week    Frequency of Social Gatherings with Friends and Family: Three times a week    Attends Religious Services: More than 4 times per year    Active Member of Clubs or Organizations: Yes    Attends Archivist Meetings: More than 4 times per year    Marital Status: Widowed  Family History: The patient's family history includes Alcohol abuse in her sister; Cancer in her brother and brother; Diabetes in her brother, maternal grandmother, and sister; Heart disease in her maternal grandmother and mother; Heart disease (age of onset: 52) in her brother; Hypertension in her father; Kidney disease in her sister; Stroke in her brother.  ROS:   Please see the history of present illness.     All other systems reviewed and are negative.  EKGs/Labs/Other Studies Reviewed:    The following studies were reviewed today:   EKG:  EKG is  ordered today.  The ekg ordered today demonstrates sinus bradycardia, heart rate 58.  Recent Labs: 05/04/2022: ALT 24 05/05/2022: BUN 15; Creatinine, Ser 0.72; Hemoglobin 14.7; Platelets 178; Potassium 4.4; Sodium 139  Recent Lipid Panel    Component Value Date/Time   CHOL 204 (H) 11/16/2021 1007   CHOL 205 (H) 12/19/2015 1422   TRIG 127.0 11/16/2021 1007   HDL 68.10 11/16/2021 1007   HDL 84 12/19/2015 1422   CHOLHDL 3 11/16/2021 1007   VLDL 25.4 11/16/2021 1007   LDLCALC 110 (H) 11/16/2021 1007   LDLCALC 63 01/01/2019 1238    Physical Exam:    VS:  BP 128/66 (BP Location: Left Arm, Patient Position: Sitting, Cuff Size: Normal)   Pulse (!) 58   Ht 5' (1.524 m)   Wt 98 lb (44.5 kg)   SpO2 98%   BMI 19.14 kg/m     Wt  Readings from Last 3 Encounters:  08/13/22 98 lb (44.5 kg)  06/28/22 97 lb (44 kg)  06/15/22 97 lb 3.2 oz (44.1 kg)     GEN:  Well nourished, well developed in no acute distress HEENT: Normal NECK: No JVD; No carotid bruits CARDIAC: RRR, no murmurs, rubs, gallops RESPIRATORY:  Clear to auscultation without rales, wheezing or rhonchi  ABDOMEN: Soft, non-tender, non-distended MUSCULOSKELETAL:  No edema; No deformity  SKIN: Warm and dry NEUROLOGIC:  Alert and oriented x 3 PSYCHIATRIC:  Normal affect   ASSESSMENT:    1. Frequent PVCs   2. Primary hypertension    PLAN:    In order of problems listed above:  Frequent PVCs, palpitations, echo with preserved EF.  Symptoms improved with Toprol-XL. Continue Toprol-XL 12.5 mg daily.   Hypertension, BP controlled.  Toprol-XL, Imdur.  Spikes in blood pressure likely attributable to anxiety.  Management of underlying anxiety if present should help with BP.  Follow-up yearly.   Medication Adjustments/Labs and Tests Ordered: Current medicines are reviewed at length with the patient today.  Concerns regarding medicines are outlined above.  Orders Placed This Encounter  Procedures   EKG 12-Lead    No orders of the defined types were placed in this encounter.    Patient Instructions  Medication Instructions:   Your physician recommends that you continue on your current medications as directed. Please refer to the Current Medication list given to you today.  *If you need a refill on your cardiac medications before your next appointment, please call your pharmacy*   Follow-Up: At [facility], you and your health needs are our priority.  As part of our continuing mission to provide you with exceptional heart care, we have created designated Provider Care Teams.  These Care Teams include your primary Cardiologist (physician) and Advanced Practice Providers (APPs -  Physician Assistants and Nurse Practitioners) who all work  together to provide you with the care you need, when you need it.  We recommend signing up for the  patient portal called "MyChart".  Sign up information is provided on this After Visit Summary.  MyChart is used to connect with patients for Virtual Visits (Telemedicine).  Patients are able to view lab/test results, encounter notes, upcoming appointments, etc.  Non-urgent messages can be sent to your provider as well.   To learn more about what you can do with MyChart, go to [email or web address].    Your next appointment:   1 year(s)  The format for your next appointment:   In Person  Provider:   Kate Sable, MD    Other Instructions   Important Information About Sugar         Signed, Kate Sable, MD  08/13/2022 11:34 AM    [place]

## 2022-08-25 DIAGNOSIS — Z23 Encounter for immunization: Secondary | ICD-10-CM | POA: Diagnosis not present

## 2022-09-06 ENCOUNTER — Encounter (INDEPENDENT_AMBULATORY_CARE_PROVIDER_SITE_OTHER): Payer: Self-pay

## 2022-09-07 ENCOUNTER — Other Ambulatory Visit: Payer: Self-pay

## 2022-09-07 MED ORDER — METOPROLOL SUCCINATE ER 25 MG PO TB24: 12.5000 mg | ORAL_TABLET | Freq: Every day | ORAL | 3 refills | Status: DC

## 2022-09-15 ENCOUNTER — Ambulatory Visit
Admission: RE | Admit: 2022-09-15 | Discharge: 2022-09-15 | Disposition: A | Discharge status: Home / Self Care | Payer: Medicare Other | Source: Ambulatory Visit | Attending: Radiation Oncology | Admitting: Radiation Oncology

## 2022-09-15 DIAGNOSIS — S2231XA Fracture of one rib, right side, initial encounter for closed fracture: Secondary | ICD-10-CM | POA: Diagnosis not present

## 2022-09-15 DIAGNOSIS — C3490 Malignant neoplasm of unspecified part of unspecified bronchus or lung: Secondary | ICD-10-CM | POA: Diagnosis not present

## 2022-09-15 LAB — POCT I-STAT CREATININE: Creatinine, Ser: 0.7 mg/dL (ref 0.44–1.00)

## 2022-09-15 MED ORDER — IOHEXOL 300 MG/ML  SOLN
60.0000 mL | Freq: Once | INTRAMUSCULAR | Status: AC | PRN
  Administered 2022-09-15: 60 mL via INTRAVENOUS

## 2022-09-22 ENCOUNTER — Ambulatory Visit (INDEPENDENT_AMBULATORY_CARE_PROVIDER_SITE_OTHER): Payer: Medicare Other | Admitting: Internal Medicine

## 2022-09-22 ENCOUNTER — Ambulatory Visit
Admission: RE | Admit: 2022-09-22 | Discharge: 2022-09-22 | Disposition: A | Discharge status: Home / Self Care | Payer: Medicare Other | Source: Ambulatory Visit | Attending: Radiation Oncology | Admitting: Radiation Oncology

## 2022-09-22 ENCOUNTER — Encounter: Payer: Self-pay | Admitting: Internal Medicine

## 2022-09-22 ENCOUNTER — Other Ambulatory Visit: Payer: Self-pay | Admitting: *Deleted

## 2022-09-22 VITALS — BP 134/78 | HR 63 | Temp 96.2°F | Ht 60.0 in | Wt 98.5 lb

## 2022-09-22 VITALS — BP 148/70 | HR 55 | Temp 97.7°F | Ht 60.0 in | Wt 98.2 lb

## 2022-09-22 DIAGNOSIS — E782 Mixed hyperlipidemia: Secondary | ICD-10-CM | POA: Diagnosis not present

## 2022-09-22 DIAGNOSIS — R918 Other nonspecific abnormal finding of lung field: Secondary | ICD-10-CM

## 2022-09-22 DIAGNOSIS — C3411 Malignant neoplasm of upper lobe, right bronchus or lung: Secondary | ICD-10-CM | POA: Diagnosis not present

## 2022-09-22 DIAGNOSIS — I2089 Other forms of angina pectoris: Secondary | ICD-10-CM | POA: Diagnosis not present

## 2022-09-22 DIAGNOSIS — M19041 Primary osteoarthritis, right hand: Secondary | ICD-10-CM | POA: Diagnosis not present

## 2022-09-22 DIAGNOSIS — M810 Age-related osteoporosis without current pathological fracture: Secondary | ICD-10-CM

## 2022-09-22 DIAGNOSIS — I10 Essential (primary) hypertension: Secondary | ICD-10-CM | POA: Diagnosis not present

## 2022-09-22 DIAGNOSIS — F418 Other specified anxiety disorders: Secondary | ICD-10-CM | POA: Diagnosis not present

## 2022-09-22 DIAGNOSIS — M19042 Primary osteoarthritis, left hand: Secondary | ICD-10-CM

## 2022-09-22 DIAGNOSIS — I700 Atherosclerosis of aorta: Secondary | ICD-10-CM

## 2022-09-22 DIAGNOSIS — C3490 Malignant neoplasm of unspecified part of unspecified bronchus or lung: Secondary | ICD-10-CM

## 2022-09-22 MED ORDER — NYSTATIN 100000 UNIT/GM EX CREA: TOPICAL_CREAM | CUTANEOUS | 2 refills | Status: DC

## 2022-09-22 NOTE — Assessment & Plan Note (Addendum)
Smaller in size on repeat CT Nov 8.  Repeat CT In 6 months.

## 2022-09-22 NOTE — Assessment & Plan Note (Signed)
Untreated due to fear of side effects of drugs,incluidng prolia .  Taking vitamin D and calciun. New fracture of 11th rib on the right noted on Nov 8 CT

## 2022-09-22 NOTE — Assessment & Plan Note (Signed)
Sympotms controlled without ant rheu meds.  Saw Patel recently.

## 2022-09-22 NOTE — Progress Notes (Unsigned)
Subjective:  Patient ID: [name], female    DOB: [birth year]  Age: 88 y.o. MRN: [id number]  CC: There were no encounter diagnoses.   HPI [name] presents for FOLLOW UP ON MULTIPLE ISSUES  Chief Complaint  Patient presents with   Follow-up   1) LUNG MASS RUL nodule smaller on repeat CT done Nov 8  2) new  posterior rib fracture 11th,  on right , nondisplaced .  Felt that it happened on the day of the PET scan   on July 27,  she got up suddenly and fell over and hit her back on a chair .  Had pain for several weeks. Pain in back as well .   3)anxiety about health driving her blood pressure up at times.  Improving outlook ,  getting reconciled to her current state. Tried taking 1/2 alprazolam once   4) HTN: taking only metoprolol and imdur . Not taking amlodipine   5) not sleeping well lately. Feels exhausted but can't fall asleep . Tried chamomile tea which has helped .  6) cheilosis managed with nystatin  cream prescribed by [facility]      Outpatient Medications Prior to Visit  Medication Sig Dispense Refill   acetaminophen (TYLENOL) 500 MG tablet Take 500 mg by mouth every 6 (six) hours as needed for mild pain.     Alfalfa 500 MG TABS Take 500 mg by mouth daily.     Ascorbic Acid (VITAMIN C CR) 500 MG CPCR Take 500 mg by mouth daily.     Calcium Carbonate (CALCIUM 600 PO) Take by mouth.     Cholecalciferol (VITAMIN D3) 3000 UNITS TABS Take 3,000 Units by mouth 2 (two) times daily.      fluticasone (FLONASE) 50 MCG/ACT nasal spray Place 1 spray into both nostrils daily.     Ginkgo Biloba (GNP GINGKO BILOBA EXTRACT PO) Take 1 tablet by mouth daily.     Glucosamine Sulfate 500 MG CAPS Take 500 mg by mouth daily.     hydrALAZINE (APRESOLINE) 10 MG tablet Take 10 mg by mouth as needed.     isosorbide mononitrate (IMDUR) 30 MG 24 hr tablet Take 1 tablet (30 mg total) by mouth daily. 30 tablet 3   Lactobacillus (PROBIOTIC ACIDOPHILUS PO) Take 1 capsule by mouth  daily.      loratadine (CLARITIN) 10 MG tablet Take 10 mg by mouth daily as needed for allergies.     Magnesium 400 MG CAPS Take 400 mg by mouth daily.     metoprolol succinate (TOPROL XL) 25 MG 24 hr tablet Take 0.5 tablets (12.5 mg total) by mouth daily. 45 tablet 3   Multiple Vitamin (MULTIVITAMIN) capsule Take 1 capsule by mouth daily.     mupirocin ointment (BACTROBAN) 2 % Apply to right leg wound daily - a small dab as needed once a day. 15 g 0   nitroGLYCERIN (NITROSTAT) 0.4 MG SL tablet DISSOLVE ONE TABLET UNDER THE TONGUE EVERY 5 MINUTES AS NEEDED FOR CHEST PAIN.  DO NOT EXCEED A TOTAL OF 3 DOSES IN 15 MINUTES 50 tablet 0   Omega-3 Fatty Acids (OMEGA 3 PO) Take 520 mg by mouth daily.      Pramoxine-HC (HYDROCORTISONE ACE-PRAMOXINE) 2.5-1 % CREA Apply topically as needed.      ALPRAZolam (XANAX) 0.25 MG tablet Take 0.5 tablets (0.125 mg total) by mouth as needed for anxiety (for anxiety or elevated blood pressure). (Patient not taking: Reported on 08/13/2022) 20 tablet 0  amLODipine (NORVASC) 2.5 MG tablet Take 1 tablet (2.5 mg total) by mouth daily. As needed for systolic pressure > 580 (Patient not taking: Reported on 08/13/2022) 90 tablet 0   clotrimazole-betamethasone (LOTRISONE) cream Apply topically 2 (two) times daily. (Patient not taking: Reported on 09/22/2022) 30 g 1   diclofenac sodium (VOLTAREN) 1 % GEL Apply topically as needed.  (Patient not taking: Reported on 08/13/2022)     No facility-administered medications prior to visit.    Review of Systems;  Patient denies headache, fevers, malaise, unintentional weight loss, skin rash, eye pain, sinus congestion and sinus pain, sore throat, dysphagia,  hemoptysis , cough, dyspnea, wheezing, chest pain, palpitations, orthopnea, edema, abdominal pain, nausea, melena, diarrhea, constipation, flank pain, dysuria, hematuria, urinary  Frequency, nocturia, numbness, tingling, seizures,  Focal weakness, Loss of consciousness,  Tremor,  insomnia, depression, anxiety, and suicidal ideation.      Objective:  BP (!) 148/70 (BP Location: Left Arm, Patient Position: Sitting, Cuff Size: Normal)   Pulse (!) 55   Temp 97.7 F (36.5 C) (Oral)   Ht 5' (1.524 m)   Wt 98 lb 3.2 oz (44.5 kg)   SpO2 99%   BMI 19.18 kg/m   BP Readings from Last 3 Encounters:  09/22/22 (!) 148/70  09/22/22 134/78  08/13/22 128/66    Wt Readings from Last 3 Encounters:  09/22/22 98 lb 3.2 oz (44.5 kg)  09/22/22 98 lb 8 oz (44.7 kg)  08/13/22 98 lb (44.5 kg)    General appearance: alert, cooperative and appears stated age Ears: normal TM's and external ear canals both ears Throat: lips, mucosa, and tongue normal; teeth and gums normal Neck: no adenopathy, no carotid bruit, supple, symmetrical, trachea midline and thyroid not enlarged, symmetric, no tenderness/mass/nodules Back: symmetric, no curvature. ROM normal. No CVA tenderness. Lungs: clear to auscultation bilaterally Heart: regular rate and rhythm, S1, S2 normal, no murmur, click, rub or gallop Abdomen: soft, non-tender; bowel sounds normal; no masses,  no organomegaly Pulses: 2+ and symmetric Skin: Skin color, texture, turgor normal. No rashes or lesions Lymph nodes: Cervical, supraclavicular, and axillary nodes normal. Neuro:  awake and interactive with normal mood and affect. Higher cortical functions are normal. Speech is clear without word-finding difficulty or dysarthria. Extraocular movements are intact. Visual fields of both eyes are grossly intact. Sensation to light touch is grossly intact bilaterally of upper and lower extremities. Motor examination shows 4+/5 symmetric hand grip and upper extremity and 5/5 lower extremity strength. There is no pronation or drift. Gait is non-ataxic   Lab Results  Component Value Date   HGBA1C 7.0 (H) 11/16/2021   HGBA1C 6.1 (H) 05/14/2019   HGBA1C 6.2 (H) 01/01/2019    Lab Results  Component Value Date   CREATININE 0.70 09/15/2022    CREATININE 0.72 05/05/2022   CREATININE 0.47 05/04/2022    Lab Results  Component Value Date   WBC 5.6 05/05/2022   HGB 14.7 05/05/2022   HCT 44.5 05/05/2022   PLT 178 05/05/2022   GLUCOSE 95 05/05/2022   CHOL 204 (H) 11/16/2021   TRIG 127.0 11/16/2021   HDL 68.10 11/16/2021   LDLCALC 110 (H) 11/16/2021   ALT 24 05/04/2022   AST 44 (H) 05/04/2022   NA 139 05/05/2022   K 4.4 05/05/2022   CL 105 05/05/2022   CREATININE 0.70 09/15/2022   BUN 15 05/05/2022   CO2 25 05/05/2022   TSH 1.433 06/12/2021   HGBA1C 7.0 (H) 11/16/2021   MICROALBUR <0.7 11/16/2021  No results found.  Assessment & Plan:   Problem List Items Addressed This Visit   None   I spent a total of   minutes with this patient in a face to face visit on the date of this encounter reviewing the last office visit with me in       ,  most recent visit with cardiology ,    ,  patient's diet and exercise habits, home blood pressure /blod sugar readings, recent ER visit including labs and imaging studies ,   and post visit ordering of testing and therapeutics.    Follow-up: No follow-ups on file.   Crecencio Mc, MD

## 2022-09-22 NOTE — Assessment & Plan Note (Signed)
No changes to meds per review of cardiology note from October.  Toprol xl 12. 5 mg continue for palpittionas

## 2022-09-22 NOTE — Progress Notes (Signed)
Radiation Oncology Follow up Note  Name: Ramon Brant   Date:   09/22/2022 MRN:  [id number] DOB: 1927/08/17    This 88 y.o. female presents to the clinic today for 24-month follow-up status post SBRT to her right upper lobe for probable stage I non-small cell lung cancer.  REFERRING PROVIDER: Crecencio Mc, MD  HPI: She is a 88 year old female now out 3 months having completed SBRT for stage I non-small cell lung cancer of the right upper lobe.  Seen today in routine follow-up she is doing well she is asymptomatic specifically denies cough hemoptysis or chest tightness.  She had a recent CT scan.  Showing decreased size in the right upper lobe pulmonary nodule.  No new pulmonary nodules or mediastinal adenopathy was noted.  COMPLICATIONS OF TREATMENT: none  FOLLOW UP COMPLIANCE: keeps appointments   PHYSICAL EXAM:  BP 134/78   Pulse 63   Temp (!) 96.2 F (35.7 C)   Ht 5' (1.524 m)   Wt 98 lb 8 oz (44.7 kg)   BMI 19.24 kg/m  Well-developed well-nourished patient in NAD. HEENT reveals PERLA, EOMI, discs not visualized.  Oral cavity is clear. No oral mucosal lesions are identified. Neck is clear without evidence of cervical or supraclavicular adenopathy. Lungs are clear to A&P. Cardiac examination is essentially unremarkable with regular rate and rhythm without murmur rub or thrill. Abdomen is benign with no organomegaly or masses noted. Motor sensory and DTR levels are equal and symmetric in the upper and lower extremities. Cranial nerves II through XII are grossly intact. Proprioception is intact. No peripheral adenopathy or edema is identified. No motor or sensory levels are noted. Crude visual fields are within normal range.  RADIOLOGY RESULTS: Serial scans reviewed compatible with above-stated findings  PLAN: The present time patient is doing well excellent response to SBRT treatment.  And pleased with her overall progress.  I have asked to see her back in 6 months with a  follow-up CT scan at that time.  Patient is to call with any concerns.  I would like to take this opportunity to thank you for allowing me to participate in the care of your patient.Noreene Filbert, MD

## 2022-09-22 NOTE — Patient Instructions (Signed)
I am so happy that you received good news today!  God is great!!!   Don't lose sleep!  You can use 1/2 of the alprazolam if you spend more than 20 minutes trying to fall asleep   The ShingRx vaccine series ( 2 shots,  several months apart)  is now available in local pharmacies and is much more protective thant Zostavaxs,  It is therefore ADVISED for all interested adults over 50 to prevent shingles  .  Wait until after the holidays, because it will likely cause flu like symptoms for 1-2 days

## 2022-09-23 ENCOUNTER — Encounter: Payer: Self-pay | Admitting: Internal Medicine

## 2022-09-23 NOTE — Assessment & Plan Note (Signed)
Currently untreated due to age,  and patient's concern for myalgias which would aggravate her anxiety.   Lab Results  Component Value Date   CHOL 204 (H) 11/16/2021   HDL 68.10 11/16/2021   LDLCALC 110 (H) 11/16/2021   TRIG 127.0 11/16/2021   CHOLHDL 3 11/16/2021

## 2022-09-23 NOTE — Assessment & Plan Note (Signed)
Improving.  Encouraged to use 1/2 alprazolam prn once daily to avoid excessive distress which has been proven to elevate her blood pressure and cause chest pain.

## 2022-09-23 NOTE — Assessment & Plan Note (Signed)
Currently untreated based on cardiology's recommendations due to age. LDL is minimally elevated  Lab Results  Component Value Date   CHOL 204 (H) 11/16/2021   HDL 68.10 11/16/2021   LDLCALC 110 (H) 11/16/2021   TRIG 127.0 11/16/2021   CHOLHDL 3 11/16/2021

## 2022-10-04 DIAGNOSIS — B0052 Herpesviral keratitis: Secondary | ICD-10-CM | POA: Diagnosis not present

## 2022-11-11 ENCOUNTER — Other Ambulatory Visit: Payer: Self-pay | Admitting: Internal Medicine

## 2022-12-24 ENCOUNTER — Other Ambulatory Visit (INDEPENDENT_AMBULATORY_CARE_PROVIDER_SITE_OTHER): Payer: Self-pay | Admitting: Nurse Practitioner

## 2022-12-24 DIAGNOSIS — I771 Stricture of artery: Secondary | ICD-10-CM

## 2022-12-28 ENCOUNTER — Ambulatory Visit (INDEPENDENT_AMBULATORY_CARE_PROVIDER_SITE_OTHER): Payer: HMO | Admitting: Vascular Surgery

## 2022-12-28 ENCOUNTER — Encounter (INDEPENDENT_AMBULATORY_CARE_PROVIDER_SITE_OTHER): Payer: Self-pay | Admitting: Vascular Surgery

## 2022-12-28 ENCOUNTER — Ambulatory Visit (INDEPENDENT_AMBULATORY_CARE_PROVIDER_SITE_OTHER): Payer: HMO

## 2022-12-28 VITALS — BP 153/76 | HR 50 | Resp 15 | Wt 99.0 lb

## 2022-12-28 DIAGNOSIS — I700 Atherosclerosis of aorta: Secondary | ICD-10-CM

## 2022-12-28 DIAGNOSIS — I10 Essential (primary) hypertension: Secondary | ICD-10-CM

## 2022-12-28 DIAGNOSIS — I771 Stricture of artery: Secondary | ICD-10-CM

## 2022-12-28 DIAGNOSIS — E782 Mixed hyperlipidemia: Secondary | ICD-10-CM

## 2022-12-28 NOTE — Progress Notes (Signed)
MRN : [id number]  [name] is a 89 y.o. (July 31, 1927) female who presents with chief complaint of  Chief Complaint  Patient presents with   Follow-up    Ultrasound follow up  .  History of Present Illness: Patient returns today in follow up of celiac artery stenosis.  She is status post celiac artery stent placement for visceral symptoms in the past.  She is currently doing reasonably well.  Her biggest complaint is of nighttime leg cramps.  She is also having more neuropathic type symptoms.  We checked her ABIs last year and they were normal with no malperfusion the lower extremities.  Her mesenteric duplex today shows a patent celiac artery stent and normal velocities in the superior mesenteric artery.  Current Outpatient Medications  Medication Sig Dispense Refill   acetaminophen (TYLENOL) 500 MG tablet Take 500 mg by mouth every 6 (six) hours as needed for mild pain.     Alfalfa 500 MG TABS Take 500 mg by mouth daily.     Ascorbic Acid (VITAMIN C CR) 500 MG CPCR Take 500 mg by mouth daily.     Calcium Carbonate (CALCIUM 600 PO) Take by mouth.     Cholecalciferol (VITAMIN D3) 3000 UNITS TABS Take 3,000 Units by mouth 2 (two) times daily.      fluticasone (FLONASE) 50 MCG/ACT nasal spray Place 1 spray into both nostrils daily.     Ginkgo Biloba (GNP GINGKO BILOBA EXTRACT PO) Take 1 tablet by mouth daily.     Glucosamine Sulfate 500 MG CAPS Take 500 mg by mouth daily.     hydrALAZINE (APRESOLINE) 10 MG tablet Take 10 mg by mouth as needed.     isosorbide mononitrate (IMDUR) 30 MG 24 hr tablet Take 1 tablet by mouth once daily 90 tablet 1   Lactobacillus (PROBIOTIC ACIDOPHILUS PO) Take 1 capsule by mouth daily.      loratadine (CLARITIN) 10 MG tablet Take 10 mg by mouth daily as needed for allergies.     Magnesium 400 MG CAPS Take 400 mg by mouth daily.     metoprolol succinate (TOPROL XL) 25 MG 24 hr tablet Take 0.5 tablets (12.5 mg total) by mouth daily. 45 tablet 3    Multiple Vitamin (MULTIVITAMIN) capsule Take 1 capsule by mouth daily.     mupirocin ointment (BACTROBAN) 2 % Apply to right leg wound daily - a small dab as needed once a day. 15 g 0   nitroGLYCERIN (NITROSTAT) 0.4 MG SL tablet DISSOLVE ONE TABLET UNDER THE TONGUE EVERY 5 MINUTES AS NEEDED FOR CHEST PAIN.  DO NOT EXCEED A TOTAL OF 3 DOSES IN 15 MINUTES 50 tablet 0   nystatin cream (MYCOSTATIN) Apply twice daily to corners of lips until redness resolves 15 g 2   Omega-3 Fatty Acids (OMEGA 3 PO) Take 520 mg by mouth daily.      Pramoxine-HC (HYDROCORTISONE ACE-PRAMOXINE) 2.5-1 % CREA Apply topically as needed.      ALPRAZolam (XANAX) 0.25 MG tablet Take 0.5 tablets (0.125 mg total) by mouth as needed for anxiety (for anxiety or elevated blood pressure). (Patient not taking: Reported on 08/13/2022) 20 tablet 0   amLODipine (NORVASC) 2.5 MG tablet Take 1 tablet (2.5 mg total) by mouth daily. As needed for systolic pressure > 846 (Patient not taking: Reported on 08/13/2022) 90 tablet 0   clotrimazole-betamethasone (LOTRISONE) cream Apply topically 2 (two) times daily. (Patient not taking: Reported on 09/22/2022) 30 g 1   diclofenac sodium (VOLTAREN)  1 % GEL Apply topically as needed.  (Patient not taking: Reported on 08/13/2022)     No current facility-administered medications for this visit.    Past Medical History:  Diagnosis Date   Acute COVID-19 06/04/2021   Allergy    Arthritis    Atherosclerosis    Atherosclerosis of abdominal aorta (HCC)    Chronic kidney disease    stage 2   CKD (chronic kidney disease) stage 2, GFR 60-89 ml/min    Collagen vascular disease (HCC)    Compression fracture of thoracic spine, non-traumatic ([place]) 08/19/2015   Seen on plain films October 2016   Diverticulosis 2008   GERD (gastroesophageal reflux disease)    Heart murmur    Hyperlipidemia    Hypertensive urgency 05/04/2022   Lumbar spinal stenosis    Osteoporosis    Ovarian lump    RA (rheumatoid  arthritis) ([place])    Raynaud's disease    Rheumatoid arthritis ([place])    Spinal stenosis     Past Surgical History:  Procedure Laterality Date   BREAST SURGERY Left 1965   Benign biopsy   CHOLECYSTECTOMY  2005   [facility]   COLONOSCOPY  2008   Dr. Donnella Sham   EXCISION OF BREAST BIOPSY Left 1965   benign   EYE SURGERY Bilateral    Cataract Extraction with IOL   KYPHOPLASTY N/A 12/17/2016   Procedure: KYPHOPLASTY;  Surgeon: Hessie Knows, MD;  Location: [facility];  Service: Orthopedics;  Laterality: N/A;  l4   LUMBAR LAMINECTOMY  05/09/2017   L2-L5   VISCERAL ANGIOGRAPHY N/A 08/15/2017   Procedure: VISCERAL ANGIOGRAPHY;  Surgeon: Algernon Huxley, MD;  Location: [facility];  Service: Cardiovascular;  Laterality: N/A;   VISCERAL ANGIOGRAPHY N/A 10/05/2017   Procedure: VISCERAL ANGIOGRAPHY;  Surgeon: Algernon Huxley, MD;  Location: [facility];  Service: Cardiovascular;  Laterality: N/A;   VISCERAL ARTERY INTERVENTION N/A 08/15/2017   Procedure: VISCERAL ARTERY INTERVENTION;  Surgeon: Algernon Huxley, MD;  Location: [facility];  Service: Cardiovascular;  Laterality: N/A;     Social History   Tobacco Use   Smoking status: Never   Smokeless tobacco: Never   Tobacco comments:    smoking cessation materials not required  Vaping Use   Vaping Use: Never used  Substance Use Topics   Alcohol use: No    Alcohol/week: 0.0 standard drinks of alcohol   Drug use: No       Family History  Problem Relation Age of Onset   Heart disease Mother    Hypertension Father    Heart disease Brother 33       heart attack   Cancer Brother        bladder   Diabetes Sister    Alcohol abuse Sister    Kidney disease Sister    Diabetes Brother    Stroke Brother    Diabetes Maternal Grandmother    Heart disease Maternal Grandmother    Cancer Brother        lung     Allergies  Allergen Reactions   Ibandronic Acid Other (See Comments)    Achy all over. Flu like S/S   Other  Other (See Comments)    Achy all over. Flu like S/S Dysphagia   Prednisone Rash   Actonel [Risedronate] Other (See Comments)    Dysphagia   Amoxicillin Hives   Pantoprazole Other (See Comments)    Little blistery bumps   Raloxifene Other (See Comments)  Mood swings   Versed [Midazolam] Other (See Comments)    Difficult waking up and memory loss   Doxycycline Rash    REVIEW OF SYSTEMS (Negative unless checked)   Constitutional: [x] Weight loss  [] Fever  [] Chills Cardiac: [] Chest pain   [] Chest pressure   [x] Palpitations   [] Shortness of breath when laying flat   [] Shortness of breath at rest   [] Shortness of breath with exertion. Vascular:  [] Pain in legs with walking   [] Pain in legs at rest   [] Pain in legs when laying flat   [] Claudication   [] Pain in feet when walking  [] Pain in feet at rest  [] Pain in feet when laying flat   [] History of DVT   [] Phlebitis   [] Swelling in legs   [x] Varicose veins   [] Non-healing ulcers Pulmonary:   [] Uses home oxygen   [] Productive cough   [] Hemoptysis   [] Wheeze  [] COPD   [] Asthma Neurologic:  [] Dizziness  [] Blackouts   [] Seizures   [] History of stroke   [] History of TIA  [] Aphasia   [] Temporary blindness   [] Dysphagia   [] Weakness or numbness in arms   [] Weakness or numbness in legs Musculoskeletal:  [x] Arthritis   [] Joint swelling   [] Joint pain   [x] Low back pain Hematologic:  [] Easy bruising  [] Easy bleeding   [] Hypercoagulable state   [] Anemic   Gastrointestinal:  [] Blood in stool   [] Vomiting blood  [x] Gastroesophageal reflux/heartburn   [x] Abdominal pain Genitourinary:  [x] Chronic kidney disease   [] Difficult urination  [] Frequent urination  [] Burning with urination   [] Hematuria Skin:  [] Rashes   [] Ulcers   [] Wounds Psychological:  [x] History of anxiety   []  History of major depression.  Physical Examination  BP (!) 153/76 (BP Location: Left Arm)   Pulse (!) 50   Resp 15   Wt 99 lb (44.9 kg)   BMI 19.33 kg/m  Gen:  WD/WN, NAD.   Appears much younger than stated age Head: /AT, No temporalis wasting. Ear/Nose/Throat: Hearing grossly intact, nares w/o erythema or drainage Eyes: Conjunctiva clear. Sclera non-icteric Neck: Supple.  Trachea midline Pulmonary:  Good air movement, no use of accessory muscles.  Cardiac: RRR, no JVD Vascular:  Vessel Right Left  Radial Palpable Palpable                          PT Palpable Palpable  DP Palpable Palpable   Gastrointestinal: soft, non-tender/non-distended. No guarding/reflex.  Musculoskeletal: M/S 5/5 throughout.  No deformity or atrophy.  No edema. Neurologic: Sensation grossly intact in extremities.  Symmetrical.  Speech is fluent.  Psychiatric: Judgment intact, Mood & affect appropriate for pt's clinical situation. Dermatologic: No rashes or ulcers noted.  No cellulitis or open wounds.     Labs No results found for this or any previous visit (from the past 2160 hour(s)).  Radiology No results found.  Assessment/Plan Hyperlipidemia lipid control important in reducing the progression of atherosclerotic disease.    Atherosclerosis of abdominal aorta (HCC) Known and not the cause of her abdominal pain  Essential hypertension blood pressure control important in reducing the progression of atherosclerotic disease. On appropriate oral medications.   Celiac artery stenosis (HCC) Her mesenteric duplex today shows a patent celiac artery stent and normal velocities in the superior mesenteric artery.  Continue current medical regimen.  Follow-up in 6 months with duplex.    Leotis Pain, MD  12/28/2022 9:22 AM    This note was created with Dragon medical transcription system.  Any errors from dictation are purely unintentional

## 2022-12-28 NOTE — Assessment & Plan Note (Signed)
Her mesenteric duplex today shows a patent celiac artery stent and normal velocities in the superior mesenteric artery.  Continue current medical regimen.  Follow-up in 6 months with duplex.

## 2022-12-28 NOTE — Assessment & Plan Note (Signed)
blood pressure control important in reducing the progression of atherosclerotic disease. On appropriate oral medications.  

## 2022-12-29 DIAGNOSIS — M1909 Primary osteoarthritis, other specified site: Secondary | ICD-10-CM | POA: Diagnosis not present

## 2022-12-29 DIAGNOSIS — G8929 Other chronic pain: Secondary | ICD-10-CM | POA: Diagnosis not present

## 2022-12-29 DIAGNOSIS — I8393 Asymptomatic varicose veins of bilateral lower extremities: Secondary | ICD-10-CM | POA: Diagnosis not present

## 2022-12-29 DIAGNOSIS — K219 Gastro-esophageal reflux disease without esophagitis: Secondary | ICD-10-CM | POA: Diagnosis not present

## 2022-12-29 DIAGNOSIS — I7300 Raynaud's syndrome without gangrene: Secondary | ICD-10-CM | POA: Diagnosis not present

## 2022-12-29 DIAGNOSIS — R32 Unspecified urinary incontinence: Secondary | ICD-10-CM | POA: Diagnosis not present

## 2022-12-29 DIAGNOSIS — I10 Essential (primary) hypertension: Secondary | ICD-10-CM | POA: Diagnosis not present

## 2023-02-07 DIAGNOSIS — I7300 Raynaud's syndrome without gangrene: Secondary | ICD-10-CM | POA: Diagnosis not present

## 2023-02-07 DIAGNOSIS — R768 Other specified abnormal immunological findings in serum: Secondary | ICD-10-CM | POA: Diagnosis not present

## 2023-02-07 DIAGNOSIS — M159 Polyosteoarthritis, unspecified: Secondary | ICD-10-CM | POA: Diagnosis not present

## 2023-02-24 ENCOUNTER — Ambulatory Visit: Payer: Medicare Other | Admitting: Radiation Oncology

## 2023-03-07 ENCOUNTER — Telehealth: Payer: Self-pay | Admitting: Internal Medicine

## 2023-03-07 NOTE — Telephone Encounter (Signed)
LMTCB. Need to find out what pt is needing to be seen for.

## 2023-03-07 NOTE — Telephone Encounter (Signed)
Pt called in staying that she would like to make an appt with Dr.Tullo. However, theres no opening until 5/21. As per pt, thats too far, and she wanted to know if Darrick Huntsman can work her in before then?

## 2023-03-08 NOTE — Telephone Encounter (Signed)
Spoke with pt and moved her appt up to Thursday at 11.

## 2023-03-08 NOTE — Telephone Encounter (Signed)
Pt called back and I read the message to her and she stated she wants blood work done because when she went to the vascular doctor on the 2/20 they ran a scan on her abdomen and she had a brown peddle implant that was inserted her stomach and she got it out the next day with Vaseline. Pt noticed her blood vessels turn red on the top of her hand and she has stiffness in her legs

## 2023-03-10 ENCOUNTER — Telehealth: Payer: Self-pay | Admitting: Internal Medicine

## 2023-03-10 ENCOUNTER — Ambulatory Visit (INDEPENDENT_AMBULATORY_CARE_PROVIDER_SITE_OTHER): Payer: HMO | Admitting: Internal Medicine

## 2023-03-10 ENCOUNTER — Encounter: Payer: Self-pay | Admitting: Internal Medicine

## 2023-03-10 VITALS — BP 170/94 | HR 59 | Temp 98.3°F | Ht 60.0 in | Wt 98.2 lb

## 2023-03-10 DIAGNOSIS — R768 Other specified abnormal immunological findings in serum: Secondary | ICD-10-CM | POA: Diagnosis not present

## 2023-03-10 DIAGNOSIS — E785 Hyperlipidemia, unspecified: Secondary | ICD-10-CM | POA: Diagnosis not present

## 2023-03-10 DIAGNOSIS — Z66 Do not resuscitate: Secondary | ICD-10-CM | POA: Diagnosis not present

## 2023-03-10 DIAGNOSIS — R42 Dizziness and giddiness: Secondary | ICD-10-CM

## 2023-03-10 DIAGNOSIS — H814 Vertigo of central origin: Secondary | ICD-10-CM

## 2023-03-10 DIAGNOSIS — I739 Peripheral vascular disease, unspecified: Secondary | ICD-10-CM | POA: Diagnosis not present

## 2023-03-10 DIAGNOSIS — R5383 Other fatigue: Secondary | ICD-10-CM | POA: Diagnosis not present

## 2023-03-10 DIAGNOSIS — R7689 Other specified abnormal immunological findings in serum: Secondary | ICD-10-CM

## 2023-03-10 DIAGNOSIS — I1A0 Resistant hypertension: Secondary | ICD-10-CM

## 2023-03-10 DIAGNOSIS — T148XXA Other injury of unspecified body region, initial encounter: Secondary | ICD-10-CM | POA: Diagnosis not present

## 2023-03-10 DIAGNOSIS — I771 Stricture of artery: Secondary | ICD-10-CM | POA: Diagnosis not present

## 2023-03-10 DIAGNOSIS — I10 Essential (primary) hypertension: Secondary | ICD-10-CM | POA: Diagnosis not present

## 2023-03-10 DIAGNOSIS — I700 Atherosclerosis of aorta: Secondary | ICD-10-CM

## 2023-03-10 DIAGNOSIS — G4452 New daily persistent headache (NDPH): Secondary | ICD-10-CM | POA: Diagnosis not present

## 2023-03-10 DIAGNOSIS — I774 Celiac artery compression syndrome: Secondary | ICD-10-CM

## 2023-03-10 LAB — LIPID PANEL
Cholesterol: 193 mg/dL (ref 0–200)
HDL: 73.4 mg/dL (ref >39.00)
LDL Cholesterol: 96 mg/dL (ref 0–99)
NonHDL: 119.7
Total CHOL/HDL Ratio: 3
Triglycerides: 119 mg/dL (ref 0.0–149.0)
VLDL: 23.8 mg/dL (ref 0.0–40.0)

## 2023-03-10 LAB — CBC WITH DIFFERENTIAL/PLATELET
Basophils Absolute: 0 10*3/uL (ref 0.0–0.1)
Basophils Relative: 0.9 % (ref 0.0–3.0)
Eosinophils Absolute: 0 10*3/uL (ref 0.0–0.7)
Eosinophils Relative: 0.9 % (ref 0.0–5.0)
HCT: 42.3 % (ref 36.0–46.0)
Hemoglobin: 14.1 g/dL (ref 12.0–15.0)
Lymphocytes Relative: 32.3 % (ref 12.0–46.0)
Lymphs Abs: 1.8 10*3/uL (ref 0.7–4.0)
MCHC: 33.4 g/dL (ref 30.0–36.0)
MCV: 91.4 fl (ref 78.0–100.0)
Monocytes Absolute: 0.6 10*3/uL (ref 0.1–1.0)
Monocytes Relative: 10.9 % (ref 3.0–12.0)
Neutro Abs: 3 10*3/uL (ref 1.4–7.7)
Neutrophils Relative %: 55 % (ref 43.0–77.0)
Platelets: 205 10*3/uL (ref 150.0–400.0)
RBC: 4.63 Mil/uL (ref 3.87–5.11)
RDW: 13.1 % (ref 11.5–15.5)
WBC: 5.5 10*3/uL (ref 4.0–10.5)

## 2023-03-10 LAB — COMPREHENSIVE METABOLIC PANEL
ALT: 19 U/L (ref 0–35)
AST: 35 U/L (ref 0–37)
Albumin: 4.2 g/dL (ref 3.5–5.2)
Alkaline Phosphatase: 48 U/L (ref 39–117)
BUN: 19 mg/dL (ref 6–23)
CO2: 28 mEq/L (ref 19–32)
Calcium: 9.2 mg/dL (ref 8.4–10.5)
Chloride: 102 mEq/L (ref 96–112)
Creatinine, Ser: 0.62 mg/dL (ref 0.40–1.20)
GFR: 76.79 mL/min (ref >60.00)
Glucose, Bld: 86 mg/dL (ref 70–99)
Potassium: 4.6 mEq/L (ref 3.5–5.1)
Sodium: 139 mEq/L (ref 135–145)
Total Bilirubin: 0.4 mg/dL (ref 0.2–1.2)
Total Protein: 6.7 g/dL (ref 6.0–8.3)

## 2023-03-10 LAB — LDL CHOLESTEROL, DIRECT: Direct LDL: 96 mg/dL

## 2023-03-10 LAB — TSH: TSH: 2.13 u[IU]/mL (ref 0.35–5.50)

## 2023-03-10 MED ORDER — ISOSORBIDE MONONITRATE ER 30 MG PO TB24
30.0000 mg | ORAL_TABLET | Freq: Every day | ORAL | 1 refills | Status: DC
Start: 2023-03-10 — End: 2023-10-17

## 2023-03-10 NOTE — Assessment & Plan Note (Signed)
Reviewed April 2024 visit with Dr Allena Katz at [facility]:  No signs of rheumatoid arthritis or connective tissue disease at this point -- Continue with Tylenol for pain control  -- Continue with Voltaren Gel -- For the raynaud's advised her to keep body warm

## 2023-03-10 NOTE — Assessment & Plan Note (Signed)
S/P FORMAL RHEUM EVAL FOR POSITIVE ANA POSIT RHEUAMTOID FACTOR.  NO SIGNS OF DISEAS

## 2023-03-10 NOTE — Patient Instructions (Addendum)
Dr Wyn Quaker evaluated the blood flow In your celiac artery and the other blood vessels in your abdomen  and the blood flow is normal.    There is no restriction   The MRA brain is to evaluate the blood vessels in your brain

## 2023-03-10 NOTE — Progress Notes (Addendum)
Subjective:  Patient ID: [name], female    DOB: Feb 24, 1927  Age: 89 y.o. MRN: [id number]  CC: The primary encounter diagnosis was Elevated rheumatoid factor. Diagnoses of Atherosclerosis of abdominal aorta (HCC), Celiac artery stenosis (HCC), Positive ANA (antinuclear antibody), Vertigo of central origin, Hyperlipidemia LDL goal <100, Resistant hypertension, Bruising, Other fatigue, DNAR (do not attempt resuscitation), Essential hypertension, Peripheral vascular disease (HCC), New daily persistent headache, and Light headedness were also pertinent to this visit.   HPI [name] presents for follow up on multiple issues.  Chief Complaint  Patient presents with   Medical Management of Chronic Issues    1) PAD:  she has a history of celiac artery stenosis with prior stent placement. In 2018  She has recently had follow up with Vascular Surgery following a mesenteric vascular ultrasound and is requesting a more detailed explanation of the results tham what was conveyed to her in her follow up  visit in February  2) She reports recurrent episodes of feeling weak and quivering, accompanied by headaches and notes that her BP is elevated when she feels this way  .  She denies chest pain , palpitations.  She had a bad day on  Monday.  Body felt  stiff , Felt light headed.  No vertigo or   no darks spots in front of eyes. Did not follow strenuous activity over the weekend except that she bathed both dogs   (daughters helped her  bathe them )    3) Hypertension: patient checks blood pressure daily  at home.  Readings have been for the most part > 140/80 at rest .she has several medications that she takes " as needed"  (amlodipine and hydralazine ) and takes only 12.5 mg metoprolol on a regular basis .[name]   Patient is following a reduce salt diet most days and is taking medications as prescribed       Outpatient Medications Prior to Visit  Medication Sig Dispense Refill    acetaminophen (TYLENOL) 500 MG tablet Take 500 mg by mouth every 6 (six) hours as needed for mild pain.     Alfalfa 500 MG TABS Take 500 mg by mouth daily.     ALPRAZolam (XANAX) 0.25 MG tablet Take 0.5 tablets (0.125 mg total) by mouth as needed for anxiety (for anxiety or elevated blood pressure). 20 tablet 0   amLODipine (NORVASC) 2.5 MG tablet Take 1 tablet (2.5 mg total) by mouth daily. As needed for systolic pressure > 150 (Patient not taking: Reported on 03/29/2023) 90 tablet 0   Ascorbic Acid (VITAMIN C CR) 500 MG CPCR Take 500 mg by mouth daily.     Calcium Carbonate (CALCIUM 600 PO) Take by mouth.     Cholecalciferol (VITAMIN D3) 3000 UNITS TABS Take 3,000 Units by mouth 2 (two) times daily.      clotrimazole-betamethasone (LOTRISONE) cream Apply topically 2 (two) times daily. 30 g 1   diclofenac sodium (VOLTAREN) 1 % GEL Apply topically as needed.     fluticasone (FLONASE) 50 MCG/ACT nasal spray Place 1 spray into both nostrils daily.     Ginkgo Biloba (GNP GINGKO BILOBA EXTRACT PO) Take 1 tablet by mouth daily.     Glucosamine Sulfate 500 MG CAPS Take 500 mg by mouth daily.     hydrALAZINE (APRESOLINE) 10 MG tablet Take 10 mg by mouth as needed.     Lactobacillus (PROBIOTIC ACIDOPHILUS PO) Take 1 capsule by mouth daily.      loratadine (  CLARITIN) 10 MG tablet Take 10 mg by mouth daily as needed for allergies.     Magnesium 400 MG CAPS Take 400 mg by mouth daily.     metoprolol succinate (TOPROL XL) 25 MG 24 hr tablet Take 0.5 tablets (12.5 mg total) by mouth daily. 45 tablet 3   Multiple Vitamin (MULTIVITAMIN) capsule Take 1 capsule by mouth daily.     mupirocin ointment (BACTROBAN) 2 % Apply to right leg wound daily - a small dab as needed once a day. 15 g 0   nitroGLYCERIN (NITROSTAT) 0.4 MG SL tablet DISSOLVE ONE TABLET UNDER THE TONGUE EVERY 5 MINUTES AS NEEDED FOR CHEST PAIN.  DO NOT EXCEED A TOTAL OF 3 DOSES IN 15 MINUTES 50 tablet 0   nystatin cream (MYCOSTATIN) Apply twice  daily to corners of lips until redness resolves 15 g 2   Omega-3 Fatty Acids (OMEGA 3 PO) Take 520 mg by mouth daily.      Pramoxine-HC (HYDROCORTISONE ACE-PRAMOXINE) 2.5-1 % CREA Apply topically as needed.      isosorbide mononitrate (IMDUR) 30 MG 24 hr tablet Take 1 tablet by mouth once daily 90 tablet 1   No facility-administered medications prior to visit.    Review of Systems;  Patient denies headache, fevers, malaise, unintentional weight loss, skin rash, eye pain, sinus congestion and sinus pain, sore throat, dysphagia,  hemoptysis , cough, dyspnea, wheezing, chest pain, palpitations, orthopnea, edema, abdominal pain, nausea, melena, diarrhea, constipation, flank pain, dysuria, hematuria, urinary  Frequency, nocturia, numbness, tingling, seizures,  Focal weakness, Loss of consciousness,  Tremor, insomnia, depression, anxiety, and suicidal ideation.      Objective:  BP (!) 170/94   Pulse (!) 59   Temp 98.3 F (36.8 C) (Oral)   Ht 5' (1.524 m)   Wt 98 lb 3.2 oz (44.5 kg)   SpO2 97%   BMI 19.18 kg/m   BP Readings from Last 3 Encounters:  03/31/23 (!) 164/78  03/29/23 (!) 152/88  03/10/23 (!) 170/94    Wt Readings from Last 3 Encounters:  03/31/23 99 lb 14.4 oz (45.3 kg)  03/29/23 99 lb 3.2 oz (45 kg)  03/10/23 98 lb 3.2 oz (44.5 kg)    Physical Exam Vitals reviewed.  Constitutional:      General: She is not in acute distress.    Appearance: Normal appearance. She is normal weight. She is not ill-appearing, toxic-appearing or diaphoretic.  HENT:     Head: Normocephalic.  Eyes:     General: No scleral icterus.       Right eye: No discharge.        Left eye: No discharge.     Conjunctiva/sclera: Conjunctivae normal.  Cardiovascular:     Rate and Rhythm: Normal rate and regular rhythm.     Heart sounds: Normal heart sounds.  Pulmonary:     Effort: Pulmonary effort is normal. No respiratory distress.     Breath sounds: Normal breath sounds.  Musculoskeletal:         General: Normal range of motion.  Skin:    General: Skin is warm and dry.  Neurological:     General: No focal deficit present.     Mental Status: She is alert and oriented to person, place, and time. Mental status is at baseline.  Psychiatric:        Mood and Affect: Mood normal.        Behavior: Behavior normal.        Thought Content: Thought content  normal.        Judgment: Judgment normal.    Lab Results  Component Value Date   HGBA1C 7.0 (H) 11/16/2021   HGBA1C 6.1 (H) 05/14/2019   HGBA1C 6.2 (H) 01/01/2019    Lab Results  Component Value Date   CREATININE 0.62 03/10/2023   CREATININE 0.70 09/15/2022   CREATININE 0.72 05/05/2022    Lab Results  Component Value Date   WBC 5.5 03/10/2023   HGB 14.1 03/10/2023   HCT 42.3 03/10/2023   PLT 205.0 03/10/2023   GLUCOSE 86 03/10/2023   CHOL 193 03/10/2023   TRIG 119.0 03/10/2023   HDL 73.40 03/10/2023   LDLDIRECT 96.0 03/10/2023   LDLCALC 96 03/10/2023   ALT 19 03/10/2023   AST 35 03/10/2023   NA 139 03/10/2023   K 4.6 03/10/2023   CL 102 03/10/2023   CREATININE 0.62 03/10/2023   BUN 19 03/10/2023   CO2 28 03/10/2023   TSH 2.13 03/10/2023   HGBA1C 7.0 (H) 11/16/2021   MICROALBUR <0.7 11/16/2021    No results found.  Assessment & Plan:  .Elevated rheumatoid factor Assessment & Plan: Reviewed April 2024 visit with Dr Allena Katz at [facility]:  No signs of rheumatoid arthritis or connective tissue disease at this point -- Continue with Tylenol for pain control  -- Continue with Voltaren Gel -- For the raynaud's advised her to keep body warm   Orders: -     LDL cholesterol, direct  Atherosclerosis of abdominal aorta (HCC) Assessment & Plan: Currently untreated due to age,  and patient's concern for myalgias which would aggravate her anxiety.   Recent mesenteric ultrasound showed no significant stenosis of CA, SMA or  IMA   Lab Results  Component Value Date   CHOL 204 (H) 11/16/2021   HDL 68.10  11/16/2021   LDLCALC 110 (H) 11/16/2021   TRIG 127.0 11/16/2021   CHOLHDL 3 11/16/2021     Orders: -     MR ANGIO HEAD WO CONTRAST; Future  Celiac artery stenosis [facility]) Assessment & Plan: Recent Vascular follow up reviewed.  Normal velocities.     Positive ANA (antinuclear antibody) Assessment & Plan: S/P FORMAL RHEUM EVAL FOR POSITIVE ANA POSIT RHEUAMTOID FACTOR.  NO SIGNS OF DISEAS    Vertigo of central origin -     MR ANGIO HEAD WO CONTRAST; Future  Hyperlipidemia LDL goal <100 -     Lipid panel  Resistant hypertension -     MR ANGIO HEAD WO CONTRAST; Future -     Comprehensive metabolic panel  Bruising -     CBC with Differential/Platelet  Other fatigue -     TSH  DNAR (do not attempt resuscitation) -     Do not attempt resuscitation (DNR)  Essential hypertension Assessment & Plan: BP has been difficult to control due to recurrent epsiodes of intolerance of normal readings/ Her recurrent episodes of feeling light headed and sluggish may represent poor perfusion of brain resulting from aggressive BP control .  MRA brain ordered    Peripheral vascular disease (HCC) -     MR ANGIO HEAD WO CONTRAST; Future  New daily persistent headache Assessment & Plan: Accompanied by light headedness,  hypertension and diffuse PAD.   Orders: -     MR ANGIO HEAD WO CONTRAST; Future  Light headedness Assessment & Plan: Accompanied  by  headaches, hypertension and PAD>  MRA ordered    Other orders -     Isosorbide Mononitrate ER; Take 1 tablet (30  mg total) by mouth daily.  Dispense: 90 tablet; Refill: 1     I provided 30 minutes of face-to-face time during this encounter reviewing patient's last visit with me, patient's  most recent visit with vascular surgery,   recent surgical and non surgical procedures, previous  labs and imaging studies, counseling on currently addressed issues,  and post visit ordering to diagnostics and therapeutics .   Follow-up: Return in  about 3 months (around 06/10/2023).   Sherlene Shams, MD

## 2023-03-10 NOTE — Assessment & Plan Note (Addendum)
Currently untreated due to age,  and patient's concern for myalgias which would aggravate her anxiety.   Recent mesenteric ultrasound showed no significant stenosis of CA, SMA or  IMA   Lab Results  Component Value Date   CHOL 204 (H) 11/16/2021   HDL 68.10 11/16/2021   LDLCALC 110 (H) 11/16/2021   TRIG 127.0 11/16/2021   CHOLHDL 3 11/16/2021

## 2023-03-10 NOTE — Assessment & Plan Note (Signed)
S/p evaluation by  rheumatologist April 2024 diagnosis of RA  is NOT MADE.

## 2023-03-10 NOTE — Assessment & Plan Note (Signed)
Recent Vascular follow up reviewed.  Normal velocities.

## 2023-03-10 NOTE — Telephone Encounter (Signed)
Lft pt vm to call ofc to sch MRI. thanks 

## 2023-03-11 ENCOUNTER — Telehealth: Payer: Self-pay | Admitting: Internal Medicine

## 2023-03-11 NOTE — Telephone Encounter (Signed)
Lft pt vm to call ofc to sch MRI. thanks 

## 2023-03-12 NOTE — Assessment & Plan Note (Addendum)
BP has been difficult to control due to recurrent epsiodes of intolerance of normal readings/ Her recurrent episodes of feeling light headed and sluggish may represent poor perfusion of brain resulting from aggressive BP control .  MRA brain ordered

## 2023-03-14 ENCOUNTER — Ambulatory Visit
Admission: RE | Admit: 2023-03-14 | Discharge: 2023-03-14 | Disposition: A | Discharge status: Home / Self Care | Payer: HMO | Source: Ambulatory Visit | Attending: Radiation Oncology | Admitting: Radiation Oncology

## 2023-03-14 DIAGNOSIS — R918 Other nonspecific abnormal finding of lung field: Secondary | ICD-10-CM | POA: Diagnosis not present

## 2023-03-14 DIAGNOSIS — C3490 Malignant neoplasm of unspecified part of unspecified bronchus or lung: Secondary | ICD-10-CM | POA: Insufficient documentation

## 2023-03-14 DIAGNOSIS — J479 Bronchiectasis, uncomplicated: Secondary | ICD-10-CM | POA: Diagnosis not present

## 2023-03-14 MED ORDER — IOHEXOL 300 MG/ML  SOLN
60.0000 mL | Freq: Once | INTRAMUSCULAR | Status: AC | PRN
  Administered 2023-03-14: 60 mL via INTRAVENOUS

## 2023-03-15 ENCOUNTER — Ambulatory Visit: Payer: Self-pay

## 2023-03-15 DIAGNOSIS — M25522 Pain in left elbow: Secondary | ICD-10-CM | POA: Diagnosis not present

## 2023-03-15 DIAGNOSIS — M7022 Olecranon bursitis, left elbow: Secondary | ICD-10-CM | POA: Diagnosis not present

## 2023-03-15 DIAGNOSIS — S93402A Sprain of unspecified ligament of left ankle, initial encounter: Secondary | ICD-10-CM | POA: Diagnosis not present

## 2023-03-15 DIAGNOSIS — S5002XA Contusion of left elbow, initial encounter: Secondary | ICD-10-CM | POA: Diagnosis not present

## 2023-03-15 DIAGNOSIS — M25572 Pain in left ankle and joints of left foot: Secondary | ICD-10-CM | POA: Diagnosis not present

## 2023-03-15 DIAGNOSIS — M79672 Pain in left foot: Secondary | ICD-10-CM | POA: Diagnosis not present

## 2023-03-21 ENCOUNTER — Ambulatory Visit
Admission: RE | Admit: 2023-03-21 | Discharge: 2023-03-21 | Disposition: A | Discharge status: Home / Self Care | Payer: HMO | Source: Ambulatory Visit | Attending: Internal Medicine | Admitting: Internal Medicine

## 2023-03-21 DIAGNOSIS — I669 Occlusion and stenosis of unspecified cerebral artery: Secondary | ICD-10-CM | POA: Insufficient documentation

## 2023-03-21 DIAGNOSIS — H814 Vertigo of central origin: Secondary | ICD-10-CM | POA: Diagnosis not present

## 2023-03-21 DIAGNOSIS — I6621 Occlusion and stenosis of right posterior cerebral artery: Secondary | ICD-10-CM | POA: Diagnosis not present

## 2023-03-24 DIAGNOSIS — S5002XA Contusion of left elbow, initial encounter: Secondary | ICD-10-CM | POA: Diagnosis not present

## 2023-03-24 DIAGNOSIS — S93402A Sprain of unspecified ligament of left ankle, initial encounter: Secondary | ICD-10-CM | POA: Diagnosis not present

## 2023-03-29 ENCOUNTER — Ambulatory Visit (INDEPENDENT_AMBULATORY_CARE_PROVIDER_SITE_OTHER): Payer: HMO | Admitting: Internal Medicine

## 2023-03-29 ENCOUNTER — Encounter: Payer: Self-pay | Admitting: Internal Medicine

## 2023-03-29 VITALS — BP 152/88 | HR 67 | Temp 98.1°F | Ht 60.0 in | Wt 99.2 lb

## 2023-03-29 DIAGNOSIS — I10 Essential (primary) hypertension: Secondary | ICD-10-CM

## 2023-03-29 DIAGNOSIS — M6281 Muscle weakness (generalized): Secondary | ICD-10-CM

## 2023-03-29 DIAGNOSIS — R42 Dizziness and giddiness: Secondary | ICD-10-CM

## 2023-03-29 MED ORDER — ASPIRIN 81 MG PO TBEC
81.0000 mg | DELAYED_RELEASE_TABLET | Freq: Every day | ORAL | 2 refills | Status: DC
Start: 2023-03-29 — End: 2024-09-25

## 2023-03-29 MED ORDER — ROSUVASTATIN CALCIUM 10 MG PO TABS: 10.0000 mg | ORAL_TABLET | ORAL | 1 refills | Status: DC

## 2023-03-29 NOTE — Progress Notes (Unsigned)
Subjective:  Patient ID: [name], female    DOB: 04-Nov-1927  Age: 89 y.o. MRN: [id number]  CC: There were no encounter diagnoses.   HPI [name] presents for  Chief Complaint  Patient presents with   discuss imaging results    89 yr old female with PAD,  labile hypertension recently seen with c/o light headedness occurring when BP is elevated,  and episodes of generalized weakness not accompanied by vertigo or vision changes.  Returns to discuss results of MRA angiogram done last week.  Results of MRA:  Short segment occlusion of the right PCA distal P2 segment. Moderate  stenosis of the right P1 segment.  However her vertebral arteries were patent as well as her intracerebral  carotid arteries   Reviewed the symptoms of  right posterior cerebral artery occlusion :  (as referenced in Up to Date):  She has none of the features that are more common with posterior compared with anterior circulation infarction  except light headedness without true vertigo  ( no  altered alertness, behavior, and memory; visual loss; eye movement and pupillary abnormalities; bulbar weakness; somatosensory loss; dizziness/vertigo; and limb and gait ataxia. )  Right PCA territory syndromes -- Infarcts of the right PCA territory are often accompanied by the following findings: ?Prosopagnosia, which is difficulty in recognizing familiar faces [46,50]. ?Spatial disorientation and an inability to recall routes or to read or visualize the location of places on maps are also common [51]. Patients with right occipitotemporal infarcts also may have difficulty visualizing what a given object or person looks like. Dreams may be devoid of visual imagery.  Her light headedness is intermittent and does not occur daily.  She has had several soft mechanical falls occurring while bending over (lost her balance).  Struck left elbow and left ankle started swelling several hours later  went to [facility]  and had both x rayed,  no fractures . Put her in  a walking boot but she couldn't wear it > 1 day  because it was too heavy.  Iced and elevated and better the next   She had an episode of double vision in 2023   diagnosed  6th nerve palsy by [facility]  MRI was done   no cerebral infarct  seen .    "I'm getting weaker every day."    Outpatient Medications Prior to Visit  Medication Sig Dispense Refill   acetaminophen (TYLENOL) 500 MG tablet Take 500 mg by mouth every 6 (six) hours as needed for mild pain.     Alfalfa 500 MG TABS Take 500 mg by mouth daily.     ALPRAZolam (XANAX) 0.25 MG tablet Take 0.5 tablets (0.125 mg total) by mouth as needed for anxiety (for anxiety or elevated blood pressure). 20 tablet 0   Ascorbic Acid (VITAMIN C CR) 500 MG CPCR Take 500 mg by mouth daily.     Calcium Carbonate (CALCIUM 600 PO) Take by mouth.     Cholecalciferol (VITAMIN D3) 3000 UNITS TABS Take 3,000 Units by mouth 2 (two) times daily.      clotrimazole-betamethasone (LOTRISONE) cream Apply topically 2 (two) times daily. 30 g 1   diclofenac sodium (VOLTAREN) 1 % GEL Apply topically as needed.     fluticasone (FLONASE) 50 MCG/ACT nasal spray Place 1 spray into both nostrils daily.     Ginkgo Biloba (GNP GINGKO BILOBA EXTRACT PO) Take 1 tablet by mouth daily.     Glucosamine Sulfate 500 MG CAPS Take  500 mg by mouth daily.     isosorbide mononitrate (IMDUR) 30 MG 24 hr tablet Take 1 tablet (30 mg total) by mouth daily. 90 tablet 1   Lactobacillus (PROBIOTIC ACIDOPHILUS PO) Take 1 capsule by mouth daily.      loratadine (CLARITIN) 10 MG tablet Take 10 mg by mouth daily as needed for allergies.     Magnesium 400 MG CAPS Take 400 mg by mouth daily.     metoprolol succinate (TOPROL XL) 25 MG 24 hr tablet Take 0.5 tablets (12.5 mg total) by mouth daily. 45 tablet 3   Multiple Vitamin (MULTIVITAMIN) capsule Take 1 capsule by mouth daily.     mupirocin ointment (BACTROBAN) 2 % Apply to right leg wound daily -  a small dab as needed once a day. 15 g 0   nitroGLYCERIN (NITROSTAT) 0.4 MG SL tablet DISSOLVE ONE TABLET UNDER THE TONGUE EVERY 5 MINUTES AS NEEDED FOR CHEST PAIN.  DO NOT EXCEED A TOTAL OF 3 DOSES IN 15 MINUTES 50 tablet 0   nystatin cream (MYCOSTATIN) Apply twice daily to corners of lips until redness resolves 15 g 2   Omega-3 Fatty Acids (OMEGA 3 PO) Take 520 mg by mouth daily.      Pramoxine-HC (HYDROCORTISONE ACE-PRAMOXINE) 2.5-1 % CREA Apply topically as needed.      amLODipine (NORVASC) 2.5 MG tablet Take 1 tablet (2.5 mg total) by mouth daily. As needed for systolic pressure > 150 (Patient not taking: Reported on 03/29/2023) 90 tablet 0   hydrALAZINE (APRESOLINE) 10 MG tablet Take 10 mg by mouth as needed. (Patient not taking: Reported on 03/29/2023)     No facility-administered medications prior to visit.    Review of Systems;  Patient denies headache, fevers, malaise, unintentional weight loss, skin rash, eye pain, sinus congestion and sinus pain, sore throat, dysphagia,  hemoptysis , cough, dyspnea, wheezing, chest pain, palpitations, orthopnea, edema, abdominal pain, nausea, melena, diarrhea, constipation, flank pain, dysuria, hematuria, urinary  Frequency, nocturia, numbness, tingling, seizures,  Focal weakness, Loss of consciousness,  Tremor, insomnia, depression, anxiety, and suicidal ideation.      Objective:  BP (!) 152/88   Pulse 67   Temp 98.1 F (36.7 C) (Oral)   Ht 5' (1.524 m)   Wt 99 lb 3.2 oz (45 kg)   SpO2 96%   BMI 19.37 kg/m   BP Readings from Last 3 Encounters:  03/29/23 (!) 152/88  03/10/23 (!) 170/94  12/28/22 (!) 153/76    Wt Readings from Last 3 Encounters:  03/29/23 99 lb 3.2 oz (45 kg)  03/10/23 98 lb 3.2 oz (44.5 kg)  12/28/22 99 lb (44.9 kg)    Physical Exam  Lab Results  Component Value Date   HGBA1C 7.0 (H) 11/16/2021   HGBA1C 6.1 (H) 05/14/2019   HGBA1C 6.2 (H) 01/01/2019    Lab Results  Component Value Date   CREATININE 0.62  03/10/2023   CREATININE 0.70 09/15/2022   CREATININE 0.72 05/05/2022    Lab Results  Component Value Date   WBC 5.5 03/10/2023   HGB 14.1 03/10/2023   HCT 42.3 03/10/2023   PLT 205.0 03/10/2023   GLUCOSE 86 03/10/2023   CHOL 193 03/10/2023   TRIG 119.0 03/10/2023   HDL 73.40 03/10/2023   LDLDIRECT 96.0 03/10/2023   LDLCALC 96 03/10/2023   ALT 19 03/10/2023   AST 35 03/10/2023   NA 139 03/10/2023   K 4.6 03/10/2023   CL 102 03/10/2023   CREATININE 0.62 03/10/2023  BUN 19 03/10/2023   CO2 28 03/10/2023   TSH 2.13 03/10/2023   HGBA1C 7.0 (H) 11/16/2021   MICROALBUR <0.7 11/16/2021    MR ANGIO HEAD WO CONTRAST  Result Date: 03/26/2023 CLINICAL DATA:  Vertigo EXAM: MRA HEAD WITHOUT CONTRAST TECHNIQUE: Angiographic images of the Circle of Willis were acquired using MRA technique without intravenous contrast. COMPARISON:  None Available. FINDINGS: POSTERIOR CIRCULATION: --Vertebral arteries: Normal --Inferior cerebellar arteries: Normal. --Basilar artery: Normal. --Superior cerebellar arteries: Normal. --Posterior cerebral arteries: Short segment occlusion of the right PCA distal P2 segment. Moderate stenosis of the right P1 segment. ANTERIOR CIRCULATION: --Intracranial internal carotid arteries: Normal. --Anterior cerebral arteries (ACA): Normal. --Middle cerebral arteries (MCA): Mild atherosclerotic irregularity of both MCAs without high-grade stenosis or occlusion. 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 Moderate stenosis of the right P1 segment. Electronically Signed   By: Deatra Robinson M.D.   On: 03/26/2023 18:09    Assessment & Plan:  .There are no diagnoses linked to this encounter.   I provided 30 minutes of face-to-face time during this encounter reviewing patient's last visit with me, patient's  most recent visit with cardiology,  nephrology,  and neurology,  recent surgical and non surgical procedures, previous   labs and imaging studies, counseling on currently addressed issues,  and post visit ordering to diagnostics and therapeutics .   Follow-up: No follow-ups on file.   Sherlene Shams, MD

## 2023-03-29 NOTE — Patient Instructions (Addendum)
You need 60 grams of protein daily to make you muscles strong .  Try using the protein  shake in your cereal ,  and coffee,  get at least one in every day    Baby aspirin 2 times per week   Generic crestor every other day   If you work a muscle in the yard,  you must let it rest at least a day   I am happy with 140/90 or less for blood pressure    No changes to blood pressure regimen  based o today's conversation

## 2023-03-30 DIAGNOSIS — R42 Dizziness and giddiness: Secondary | ICD-10-CM | POA: Insufficient documentation

## 2023-03-30 DIAGNOSIS — M6281 Muscle weakness (generalized): Secondary | ICD-10-CM | POA: Insufficient documentation

## 2023-03-30 NOTE — Assessment & Plan Note (Signed)
MRA findings of right PCA branch occlusion revoiewed. She has none of the symptoms  of posterior cerebral artery syndrome.  Reassurance provied,  encouraged to resume every other diay high potency statin given evidence of CVD

## 2023-03-30 NOTE — Assessment & Plan Note (Signed)
Advised to increase protein intake.  

## 2023-03-30 NOTE — Assessment & Plan Note (Signed)
BP has been difficult to control due to recurrent epsiodes of intolerance of normal readings/ Her recurrent episodes of feeling light headed and sluggish  do not appear to be caused by poor perfusion of brain  based on results of   MRA brain

## 2023-03-31 ENCOUNTER — Ambulatory Visit
Admission: RE | Admit: 2023-03-31 | Discharge: 2023-03-31 | Disposition: A | Discharge status: Home / Self Care | Payer: HMO | Source: Ambulatory Visit | Attending: Radiation Oncology | Admitting: Radiation Oncology

## 2023-03-31 ENCOUNTER — Encounter: Payer: Self-pay | Admitting: Radiation Oncology

## 2023-03-31 VITALS — BP 164/78 | HR 53 | Temp 98.0°F | Resp 16 | Ht 60.0 in | Wt 99.9 lb

## 2023-03-31 DIAGNOSIS — Z923 Personal history of irradiation: Secondary | ICD-10-CM | POA: Insufficient documentation

## 2023-03-31 DIAGNOSIS — R918 Other nonspecific abnormal finding of lung field: Secondary | ICD-10-CM | POA: Insufficient documentation

## 2023-03-31 DIAGNOSIS — C3411 Malignant neoplasm of upper lobe, right bronchus or lung: Secondary | ICD-10-CM | POA: Diagnosis not present

## 2023-03-31 NOTE — Progress Notes (Signed)
Radiation Oncology Follow up Note  Name: [name]   Date:   03/31/2023 MRN:  [id number] DOB: 04/15/1927    This 89 y.o. female presents to the clinic today for 50-month follow-up status post SBRT to her right upper lobe for probable stage I non-small cell lung cancer.  REFERRING PROVIDER: Sherlene Shams, MD  HPI: Patient is a 89 year old female now out 9 months having completed SBRT to her right upper lobe for probable stage I non-small cell lung cancer.  Seen today in routine follow-up she is doing okay she states her breathing is somewhat more labored than prior to treatment.  She specifically [place] cough hemoptysis or chest tightness..  She had a recent CT scan showing minimal residual groundglass and scarring apical segment of the right upper lobe no evidence of recurrent or metastatic disease or changes are consistent with radiation in my opinion.  COMPLICATIONS OF TREATMENT: None  FOLLOW UP COMPLIANCE: keeps appointments   PHYSICAL EXAM:  BP (!) 164/78 (BP Location: Left Arm, Patient Position: Sitting)   Pulse (!) 53   Temp 98 F (36.7 C) (Tympanic)   Resp 16   Ht 5' (1.524 m)   Wt 99 lb 14.4 oz (45.3 kg)   SpO2 99%   BMI 19.51 kg/m  Well-developed well-nourished patient in NAD. HEENT reveals PERLA, EOMI, discs not visualized.  Oral cavity is clear. No oral mucosal lesions are identified. Neck is clear without evidence of cervical or supraclavicular adenopathy. Lungs are clear to A&P. Cardiac examination is essentially unremarkable with regular rate and rhythm without murmur rub or thrill. Abdomen is benign with no organomegaly or masses noted. Motor sensory and DTR levels are equal and symmetric in the upper and lower extremities. Cranial nerves II through XII are grossly intact. Proprioception is intact. No peripheral adenopathy or edema is identified. No motor or sensory levels are noted. Crude visual fields are within normal range.  RADIOLOGY RESULTS: CT scan  reviewed compatible with above-stated findings  PLAN: Present time patient is doing well excellent results by CT criteria.  I am going to refer her back to pulmonology at [facility] clinic for pulmonary tune up.  I have also asked to see her back in 6 months with a follow-up CT scan.  Patient is to call with any concerns.  I would like to take this opportunity to thank you for allowing me to participate in the care of your patient.Carmina Miller, MD

## 2023-04-05 ENCOUNTER — Ambulatory Visit: Payer: Medicare Other | Admitting: Internal Medicine

## 2023-04-11 DIAGNOSIS — G4452 New daily persistent headache (NDPH): Secondary | ICD-10-CM | POA: Insufficient documentation

## 2023-04-11 NOTE — Assessment & Plan Note (Signed)
Accompanied  by  headaches, hypertension and PAD>  MRA ordered

## 2023-04-11 NOTE — Assessment & Plan Note (Signed)
Accompanied by light headedness,  hypertension and diffuse PAD.

## 2023-04-13 DIAGNOSIS — H5710 Ocular pain, unspecified eye: Secondary | ICD-10-CM | POA: Diagnosis not present

## 2023-04-13 DIAGNOSIS — Z961 Presence of intraocular lens: Secondary | ICD-10-CM | POA: Diagnosis not present

## 2023-04-13 DIAGNOSIS — H532 Diplopia: Secondary | ICD-10-CM | POA: Diagnosis not present

## 2023-04-15 ENCOUNTER — Telehealth: Payer: Self-pay | Admitting: *Deleted

## 2023-04-15 DIAGNOSIS — E782 Mixed hyperlipidemia: Secondary | ICD-10-CM

## 2023-04-15 NOTE — Telephone Encounter (Signed)
Please place future orders for lab appt.  

## 2023-04-19 ENCOUNTER — Other Ambulatory Visit (INDEPENDENT_AMBULATORY_CARE_PROVIDER_SITE_OTHER): Payer: HMO

## 2023-04-19 DIAGNOSIS — E782 Mixed hyperlipidemia: Secondary | ICD-10-CM

## 2023-04-19 LAB — COMPREHENSIVE METABOLIC PANEL
ALT: 21 U/L (ref 0–35)
AST: 40 U/L — ABNORMAL HIGH (ref 0–37)
Albumin: 4.4 g/dL (ref 3.5–5.2)
Alkaline Phosphatase: 55 U/L (ref 39–117)
BUN: 18 mg/dL (ref 6–23)
CO2: 30 mEq/L (ref 19–32)
Calcium: 9.7 mg/dL (ref 8.4–10.5)
Chloride: 100 mEq/L (ref 96–112)
Creatinine, Ser: 0.71 mg/dL (ref 0.40–1.20)
GFR: 73.26 mL/min (ref >60.00)
Glucose, Bld: 145 mg/dL — ABNORMAL HIGH (ref 70–99)
Potassium: 3.9 mEq/L (ref 3.5–5.1)
Sodium: 137 mEq/L (ref 135–145)
Total Bilirubin: 0.5 mg/dL (ref 0.2–1.2)
Total Protein: 7.5 g/dL (ref 6.0–8.3)

## 2023-04-21 ENCOUNTER — Telehealth: Payer: Self-pay | Admitting: Internal Medicine

## 2023-04-21 NOTE — Telephone Encounter (Signed)
Copied from CRM [id number]. Topic: Medicare AWV >> Apr 21, 2023 11:21 AM Payton Doughty wrote: Reason for CRM: LVM 04/21/23 to r/s AWV. New appt date 05/20/23 at 9:45am. Please confirm date change khc  Verlee Rossetti; Care Guide Ambulatory Clinical Support [place] [facility] Direct Dial: [phone]

## 2023-04-21 NOTE — Addendum Note (Signed)
Addended by: Sherlene Shams on: 04/21/2023 12:07 PM   Modules accepted: Orders

## 2023-05-20 ENCOUNTER — Ambulatory Visit (INDEPENDENT_AMBULATORY_CARE_PROVIDER_SITE_OTHER): Payer: HMO | Admitting: *Deleted

## 2023-05-20 VITALS — Ht 60.0 in | Wt 98.3 lb

## 2023-05-20 DIAGNOSIS — Z0000 Encounter for general adult medical examination without abnormal findings: Secondary | ICD-10-CM | POA: Diagnosis not present

## 2023-05-20 NOTE — Progress Notes (Addendum)
Subjective:   [name] is a 89 y.o. female who presents for Medicare Annual (Subsequent) preventive examination.  Visit Complete: Virtual  I connected with  [name] on 05/20/23 by a audio enabled telemedicine application and verified that I am speaking with the correct person using two identifiers.  Patient Location: Home  Provider Location: Office/Clinic  I discussed the limitations of evaluation and management by telemedicine. The patient expressed understanding and agreed to proceed.    Review of Systems     Cardiac Risk Factors include: advanced age (>2men, >44 women);dyslipidemia;hypertension;Other (see comment), Risk factor comments: CAD     Objective:    Today's Vitals   05/20/23 0945  Weight: 98 lb 4.8 oz (44.6 kg)  Height: 5' (1.524 m)   Body mass index is 19.2 kg/m.     05/20/2023   10:07 AM 06/15/2022    1:19 PM 05/17/2022   10:52 AM 05/04/2022    1:25 PM 05/04/2022   12:27 AM 05/30/2021    2:59 PM 05/06/2021    9:57 AM  Advanced Directives  Does Patient Have a Medical Advance Directive? Yes Yes Yes  No No Yes  Type of Estate agent of [place];Living will Healthcare Power of [place];Living will Healthcare Power of [place];Living will    Healthcare Power of [place];Living will  Does patient want to make changes to medical advance directive? No - Patient declined No - Patient declined No - Patient declined    No - Patient declined  Copy of Healthcare Power of Attorney in Chart? Yes - validated most recent copy scanned in chart (See row information) No - copy requested Yes - validated most recent copy scanned in chart (See row information)    Yes - validated most recent copy scanned in chart (See row information)  Would patient like information on creating a medical advance directive?    No - Patient declined       Current Medications (verified) Outpatient Encounter Medications as of 05/20/2023  Medication Sig    acetaminophen (TYLENOL) 500 MG tablet Take 500 mg by mouth every 6 (six) hours as needed for mild pain.   Alfalfa 500 MG TABS Take 500 mg by mouth daily.   ALPRAZolam (XANAX) 0.25 MG tablet Take 0.5 tablets (0.125 mg total) by mouth as needed for anxiety (for anxiety or elevated blood pressure).   amLODipine (NORVASC) 2.5 MG tablet Take 1 tablet (2.5 mg total) by mouth daily. As needed for systolic pressure > 150   Ascorbic Acid (VITAMIN C CR) 500 MG CPCR Take 500 mg by mouth daily.   aspirin EC 81 MG tablet Take 1 tablet (81 mg total) by mouth daily. Swallow whole.   Calcium Carbonate (CALCIUM 600 PO) Take by mouth.   Cholecalciferol (VITAMIN D3) 3000 UNITS TABS Take 3,000 Units by mouth 2 (two) times daily.    clotrimazole-betamethasone (LOTRISONE) cream Apply topically 2 (two) times daily.   diclofenac sodium (VOLTAREN) 1 % GEL Apply topically as needed.   fluticasone (FLONASE) 50 MCG/ACT nasal spray Place 1 spray into both nostrils daily.   Ginkgo Biloba (GNP GINGKO BILOBA EXTRACT PO) Take 1 tablet by mouth daily.   Glucosamine Sulfate 500 MG CAPS Take 500 mg by mouth daily.   hydrALAZINE (APRESOLINE) 10 MG tablet Take 10 mg by mouth as needed.   isosorbide mononitrate (IMDUR) 30 MG 24 hr tablet Take 1 tablet (30 mg total) by mouth daily.   Lactobacillus (PROBIOTIC ACIDOPHILUS PO) Take 1 capsule by  mouth daily.    loratadine (CLARITIN) 10 MG tablet Take 10 mg by mouth daily as needed for allergies.   Magnesium 400 MG CAPS Take 400 mg by mouth daily.   metoprolol succinate (TOPROL XL) 25 MG 24 hr tablet Take 0.5 tablets (12.5 mg total) by mouth daily.   Multiple Vitamin (MULTIVITAMIN) capsule Take 1 capsule by mouth daily.   mupirocin ointment (BACTROBAN) 2 % Apply to right leg wound daily - a small dab as needed once a day.   nitroGLYCERIN (NITROSTAT) 0.4 MG SL tablet DISSOLVE ONE TABLET UNDER THE TONGUE EVERY 5 MINUTES AS NEEDED FOR CHEST PAIN.  DO NOT EXCEED A TOTAL OF 3 DOSES IN 15  MINUTES   nystatin cream (MYCOSTATIN) Apply twice daily to corners of lips until redness resolves   Omega-3 Fatty Acids (OMEGA 3 PO) Take 520 mg by mouth daily.    Pramoxine-HC (HYDROCORTISONE ACE-PRAMOXINE) 2.5-1 % CREA Apply topically as needed.    rosuvastatin (CRESTOR) 10 MG tablet Take 1 tablet (10 mg total) by mouth every other day.   No facility-administered encounter medications on file as of 05/20/2023.    Allergies (verified) Ibandronic acid, Other, Prednisone, Actonel [risedronate], Amoxicillin, Pantoprazole, Raloxifene, Versed [midazolam], and Doxycycline   History: Past Medical History:  Diagnosis Date   Acute COVID-19 06/04/2021   Allergy    Arthritis    Atherosclerosis    Atherosclerosis of abdominal aorta (HCC)    Chronic kidney disease    stage 2   CKD (chronic kidney disease) stage 2, GFR 60-89 ml/min    Collagen vascular disease (HCC)    Compression fracture of thoracic spine, non-traumatic (HCC) 08/19/2015   Seen on plain films October 2016   Diverticulosis 2008   GERD (gastroesophageal reflux disease)    Heart murmur    Hyperlipidemia    Hypertensive urgency 05/04/2022   Lumbar spinal stenosis    Osteoporosis    Ovarian lump    RA (rheumatoid arthritis) (HCC)    Raynaud's disease    Rheumatoid arthritis (HCC)    Spinal stenosis    Past Surgical History:  Procedure Laterality Date   BREAST SURGERY Left 1965   Benign biopsy   CHOLECYSTECTOMY  2005   [facility]   COLONOSCOPY  2008   Dr. Ricki Rodriguez   EXCISION OF BREAST BIOPSY Left 1965   benign   EYE SURGERY Bilateral    Cataract Extraction with IOL   KYPHOPLASTY N/A 12/17/2016   Procedure: KYPHOPLASTY;  Surgeon: Kennedy Bucker, MD;  Location: [facility] ORS;  Service: Orthopedics;  Laterality: N/A;  l4   LUMBAR LAMINECTOMY  05/09/2017   L2-L5   VISCERAL ANGIOGRAPHY N/A 08/15/2017   Procedure: VISCERAL ANGIOGRAPHY;  Surgeon: Annice Needy, MD;  Location: [facility];  Service: Cardiovascular;   Laterality: N/A;   VISCERAL ANGIOGRAPHY N/A 10/05/2017   Procedure: VISCERAL ANGIOGRAPHY;  Surgeon: Annice Needy, MD;  Location: [facility];  Service: Cardiovascular;  Laterality: N/A;   VISCERAL ARTERY INTERVENTION N/A 08/15/2017   Procedure: VISCERAL ARTERY INTERVENTION;  Surgeon: Annice Needy, MD;  Location: [facility];  Service: Cardiovascular;  Laterality: N/A;   Family History  Problem Relation Age of Onset   Heart disease Mother    Hypertension Father    Heart disease Brother 59       heart attack   Cancer Brother        bladder   Diabetes Sister    Alcohol abuse Sister    Kidney  disease Sister    Diabetes Brother    Stroke Brother    Diabetes Maternal Grandmother    Heart disease Maternal Grandmother    Cancer Brother        lung   Social History   Socioeconomic History   Marital status: Widowed    Spouse name: Chrissie Noa   Number of children: 3   Years of education: some college   Highest education level: Associate degree: academic program  Occupational History    Employer: RETIRED  Tobacco Use   Smoking status: Never   Smokeless tobacco: Never   Tobacco comments:    smoking cessation materials not required  Vaping Use   Vaping status: Never Used  Substance and Sexual Activity   Alcohol use: No    Alcohol/week: 0.0 standard drinks of alcohol   Drug use: No   Sexual activity: Not Currently  Other Topics Concern   Not on file  Social History Narrative   Widowed   3 daughter   Social Determinants of Health   Financial Resource Strain: Low Risk  (05/20/2023)   Overall Financial Resource Strain (CARDIA)    Difficulty of Paying Living Expenses: Not hard at all  Food Insecurity: No Food Insecurity (05/20/2023)   Hunger Vital Sign    Worried About Running Out of Food in the Last Year: Never true    Ran Out of Food in the Last Year: Never true  Transportation Needs: No Transportation Needs (05/20/2023)   PRAPARE - Scientist, research (physical sciences) (Medical): No    Lack of Transportation (Non-Medical): No  Physical Activity: Inactive (05/20/2023)   Exercise Vital Sign    Days of Exercise per Week: 0 days    Minutes of Exercise per Session: 0 min  Stress: No Stress Concern Present (05/20/2023)   [employer] of Occupational Health - Occupational Stress Questionnaire    Feeling of Stress : Only a little  Social Connections: Moderately Isolated (05/20/2023)   Social Connection and Isolation Panel [NHANES]    Frequency of Communication with Friends and Family: More than three times a week    Frequency of Social Gatherings with Friends and Family: More than three times a week    Attends Religious Services: Never    Database administrator or Organizations: No    Attends Engineer, structural: More than 4 times per year    Marital Status: Widowed    Tobacco Counseling Counseling given: Not Answered Tobacco comments: smoking cessation materials not required   Clinical Intake:  Pre-visit preparation completed: Yes  Pain : No/denies pain     BMI - recorded: 19.2 Nutritional Status: BMI of 19-24  Normal Nutritional Risks: None Diabetes: No  How often do you need to have someone help you when you read instructions, pamphlets, or other written materials from your doctor or pharmacy?: 1 - Never  Interpreter Needed?: No  Information entered by :: R. Jamaiya Tunnell LPN   Activities of Daily Living    05/20/2023    9:48 AM  In your present state of health, do you have any difficulty performing the following activities:  Hearing? 1  Comment aids, does not work well, considering new ones  Vision? 0  Difficulty concentrating or making decisions? 1  Comment forgets at times  Walking or climbing stairs? 1  Comment has to move slow  Dressing or bathing? 0  Doing errands, shopping? 1  Comment daughters take her places  Quarry manager and eating ? N  Using the Toilet? N  In the past six months, have you  accidently leaked urine? Y  Comment wears pads  Do you have problems with loss of bowel control? N  Managing your Medications? N  Managing your Finances? N  Housekeeping or managing your Housekeeping? N    Patient Care Team: Sherlene Shams, MD as PCP - General (Internal Medicine) Debbe Odea, MD as PCP - Cardiology (Cardiology) Dingeldein, Viviann Spare, MD (Ophthalmology) Geanie [name], MD as Referring Physician (Otolaryngology) Lamar Blinks, MD as Consulting Physician (Cardiology) Debbrah Alar, MD as Consulting Physician (Dermatology) Wyn Quaker Marlow Baars, MD as Consulting Physician (Vascular Surgery)  Indicate any recent Medical Services you may have received from other than [facility] providers in the past year (date may be approximate).     Assessment:   This is a routine wellness examination for Ar[place]thia.  Hearing/Vision screen Hearing Screening - Comments:: aids Vision Screening - Comments:: glasses  Dietary issues and exercise activities discussed:     Goals Addressed             This Visit's Progress    Patient Stated       Continue to move and keep going as well as she can       Depression Screen    05/20/2023   10:00 AM 03/29/2023   11:00 AM 03/10/2023   11:22 AM 09/22/2022    3:47 PM 05/17/2022   10:15 AM 11/16/2021    9:13 AM 05/15/2021    2:12 PM  PHQ 2/9 Scores  PHQ - 2 Score 0 0 0 0 0 0 0  PHQ- 9 Score 2   0 2 0     Fall Risk    05/20/2023   10:36 AM 05/20/2023   10:24 AM 03/29/2023   10:59 AM 03/10/2023   11:22 AM 09/22/2022    3:47 PM  Fall Risk   Falls in the past year? 1 0 1 0 1  Number falls in past yr: 1 0 1 0 0  Injury with Fall? 0 0 1 0 0  Comment no serious injury, did go and have x-rays      Risk for fall due to : No Fall Risks;History of fall(s);Medication side effect;Impaired balance/gait No Fall Risks No Fall Risks No Fall Risks History of fall(s)  Follow up Falls prevention discussed;Falls evaluation completed Falls prevention discussed  Falls evaluation completed Falls evaluation completed Falls evaluation completed    MEDICARE RISK AT HOME:  Medicare Risk at Home - 05/20/23 1038     Any stairs in or around the home? No    If so, are there any without handrails? No    Home free of loose throw rugs in walkways, pet beds, electrical cords, etc? Yes    Adequate lighting in your home to reduce risk of falls? Yes    Life alert? No    Use of a cane, walker or w/c? No    Grab bars in the bathroom? Yes    Shower chair or bench in shower? Yes    Elevated toilet seat or a handicapped toilet? Yes              Cognitive Function:        05/20/2023   10:07 AM 05/05/2020    9:59 AM 05/03/2019    2:29 PM 04/28/2018    2:28 PM 04/19/2017    8:43 AM  6CIT Screen  What Year? 0 points 0 points 0 points 0 points 0 points  What month? 0 points 0 points 0 points 0 points 0 points  What time? 0 points  0 points 0 points 0 points  Count back from 20 0 points  0 points 0 points 0 points  Months in reverse 0 points 0 points 0 points 0 points 0 points  Repeat phrase 2 points  0 points 2 points 0 points  Total Score 2 points  0 points 2 points 0 points    Immunizations Immunization History  Administered Date(s) Administered   Fluad Quad(high Dose 65+) 08/07/2019, 08/26/2020   Influenza, High Dose Seasonal PF 09/12/2018   Influenza,inj,Quad PF,6+ Mos 08/15/2015   Influenza-Unspecified 08/20/2011, 08/11/2012, 09/08/2013, 09/05/2014, 08/15/2015, 09/13/2016, 08/25/2017, 08/08/2021, 08/22/2022   PFIZER(Purple Top)SARS-COV-2 Vaccination 12/04/2019, 12/25/2019, 10/17/2020   Pneumococcal Conjugate-13 12/13/2014   Pneumococcal Polysaccharide-23 11/08/2000, 08/08/2006   Tdap 11/08/2008, 03/14/2019   Zoster, Live 01/07/2004, 11/08/2008    TDAP status: Up to date  Flu Vaccine status: Up to date  Pneumococcal vaccine status: Up to date  Covid-19 vaccine status: Information provided on how to obtain vaccines.   Qualifies for  Shingles Vaccine? Yes   Zostavax completed Yes   Shingrix Completed?: No.    Education has been provided regarding the importance of this vaccine. Patient has been advised to call insurance company to determine out of pocket expense if they have not yet received this vaccine. Advised may also receive vaccine at local pharmacy or Health Dept. Verbalized acceptance and understanding.  Screening Tests Health Maintenance  Topic Date Due   COVID-19 Vaccine (4 - 2023-24 season) 07/09/2022   INFLUENZA VACCINE  06/09/2023   Medicare Annual Wellness (AWV)  05/19/2024   DTaP/Tdap/Td (3 - Td or Tdap) 03/13/2029   Pneumonia Vaccine 40+ Years old  Completed   DEXA SCAN  Completed   HPV VACCINES  Aged Out   Zoster Vaccines- Shingrix  Discontinued    Health Maintenance  Health Maintenance Due  Topic Date Due   COVID-19 Vaccine (4 - 2023-24 season) 07/09/2022    Colorectal cancer screening: No longer required.   Mammogram status: No longer required due to age.  Bone Density status: Completed 3/18. Results reflect: Bone density results: OSTEOPOROSIS. Repeat every 2 years.Patient declines   Lung Cancer Screening: (Low Dose CT Chest recommended if Age 93-80 years, 20 pack-year currently smoking OR have quit w/in 15years.) does not qualify.     Additional Screening:  Hepatitis C Screening: does not qualify; Completed NA because of age  Vision Screening: Recommended annual ophthalmology exams for early detection of glaucoma and other disorders of the eye. Is the patient up to date with their annual eye exam?  Yes  Who is the provider or what is the name of the office in which the patient attends annual eye exams? [facility]  If pt is not established with a provider, would they like to be referred to a provider to establish care? No .   Dental Screening: Recommended annual dental exams for proper oral hygiene   Community Resource Referral / Chronic Care Management: CRR required this visit?   No   CCM required this visit?  No     Plan:     I have personally reviewed and noted the following in the patient's chart:   Medical and social history Use of alcohol, tobacco or illicit drugs  Current medications and supplements including opioid prescriptions. Patient is not currently taking opioid prescriptions. Functional ability and status Nutritional status Physical activity Advanced directives List of other physicians Hospitalizations,  surgeries, and ER visits in previous 12 months Vitals Screenings to include cognitive, depression, and falls Referrals and appointments  In addition, I have reviewed and discussed with patient certain preventive protocols, quality metrics, and best practice recommendations. A written personalized care plan for preventive services as well as general preventive health recommendations were provided to patient.     Sydell Axon, LPN   02/14/8118   After Visit Summary: (MyChart) Due to this being a telephonic visit, the after visit summary with patients personalized plan was offered to patient via MyChart   Nurse Notes: None   I have reviewed the above information and agree with above.   Duncan Dull, MD

## 2023-05-20 NOTE — Patient Instructions (Signed)
[name] , Thank you for taking time to come for your Medicare Wellness Visit. I appreciate your ongoing commitment to your health goals. Please review the following plan we discussed and let me know if I can assist you in the future.   These are the goals we discussed:  Goals      Maintain Healthy Lifestyle     Stay active Stay hydrated     Patient Stated     Continue to move and keep going as well as she can        This is a list of the screening recommended for you and due dates:  Health Maintenance  Topic Date Due   COVID-19 Vaccine (4 - 2023-24 season) 07/09/2022   Medicare Annual Wellness Visit  05/18/2023   Flu Shot  06/09/2023   DTaP/Tdap/Td vaccine (3 - Td or Tdap) 03/13/2029   Pneumonia Vaccine  Completed   DEXA scan (bone density measurement)  Completed   HPV Vaccine  Aged Out   Zoster (Shingles) Vaccine  Discontinued    Advanced directives: On file  Conditions/risks identified: None  Next appointment: Follow up in one year for your annual wellness visit 05/22/24 @10 :00   Preventive Care 65 Years and Older, Female Preventive care refers to lifestyle choices and visits with your health care provider that can promote health and wellness. What does preventive care include? A yearly physical exam. This is also called an annual well check. Dental exams once or twice a year. Routine eye exams. Ask your health care provider how often you should have your eyes checked. Personal lifestyle choices, including: Daily care of your teeth and gums. Regular physical activity. Eating a healthy diet. Avoiding tobacco and drug use. Limiting alcohol use. Practicing safe sex. Taking low-dose aspirin every day. Taking vitamin and mineral supplements as recommended by your health care provider. What happens during an annual well check? The services and screenings done by your health care provider during your annual well check will depend on your age, overall health, lifestyle  risk factors, and family history of disease. Counseling  Your health care provider may ask you questions about your: Alcohol use. Tobacco use. Drug use. Emotional well-being. Home and relationship well-being. Sexual activity. Eating habits. History of falls. Memory and ability to understand (cognition). Work and work Astronomer. Reproductive health. Screening  You may have the following tests or measurements: Height, weight, and BMI. Blood pressure. Lipid and cholesterol levels. These may be checked every 5 years, or more frequently if you are over 9 years old. Skin check. Lung cancer screening. You may have this screening every year starting at age 13 if you have a 30-pack-year history of smoking and currently smoke or have quit within the past 15 years. Fecal occult blood test (FOBT) of the stool. You may have this test every year starting at age 100. Flexible sigmoidoscopy or colonoscopy. You may have a sigmoidoscopy every 5 years or a colonoscopy every 10 years starting at age 40. Hepatitis C blood test. Hepatitis B blood test. Sexually transmitted disease (STD) testing. Diabetes screening. This is done by checking your blood sugar (glucose) after you have not eaten for a while (fasting). You may have this done every 1-3 years. Bone density scan. This is done to screen for osteoporosis. You may have this done starting at age 6. Mammogram. This may be done every 1-2 years. Talk to your health care provider about how often you should have regular mammograms. Talk with your health care  provider about your test results, treatment options, and if necessary, the need for more tests. Vaccines  Your health care provider may recommend certain vaccines, such as: Influenza vaccine. This is recommended every year. Tetanus, diphtheria, and acellular pertussis (Tdap, Td) vaccine. You may need a Td booster every 10 years. Zoster vaccine. You may need this after age 22. Pneumococcal  13-valent conjugate (PCV13) vaccine. One dose is recommended after age 2. Pneumococcal polysaccharide (PPSV23) vaccine. One dose is recommended after age 53. Talk to your health care provider about which screenings and vaccines you need and how often you need them. This information is not intended to replace advice given to you by your health care provider. Make sure you discuss any questions you have with your health care provider. Document Released: 11/21/2015 Document Revised: 07/14/2016 Document Reviewed: 08/26/2015 Elsevier Interactive Patient Education  2017 [employer].  Fall Prevention in the Home Falls can cause injuries. They can happen to people of all ages. There are many things you can do to make your home safe and to help prevent falls. What can I do on the outside of my home? Regularly fix the edges of walkways and driveways and fix any cracks. Remove anything that might make you trip as you walk through a door, such as a raised step or threshold. Trim any bushes or trees on the path to your home. Use bright outdoor lighting. Clear any walking paths of anything that might make someone trip, such as rocks or tools. Regularly check to see if handrails are loose or broken. Make sure that both sides of any steps have handrails. Any raised decks and porches should have guardrails on the edges. Have any leaves, snow, or ice cleared regularly. Use sand or salt on walking paths during winter. Clean up any spills in your garage right away. This includes oil or grease spills. What can I do in the bathroom? Use night lights. Install grab bars by the toilet and in the tub and shower. Do not use towel bars as grab bars. Use non-skid mats or decals in the tub or shower. If you need to sit down in the shower, use a plastic, non-slip stool. Keep the floor dry. Clean up any water that spills on the floor as soon as it happens. Remove soap buildup in the tub or shower regularly. Attach  bath mats securely with double-sided non-slip rug tape. Do not have throw rugs and other things on the floor that can make you trip. What can I do in the bedroom? Use night lights. Make sure that you have a light by your bed that is easy to reach. Do not use any sheets or blankets that are too big for your bed. They should not hang down onto the floor. Have a firm chair that has side arms. You can use this for support while you get dressed. Do not have throw rugs and other things on the floor that can make you trip. What can I do in the kitchen? Clean up any spills right away. Avoid walking on wet floors. Keep items that you use a lot in easy-to-reach places. If you need to reach something above you, use a strong step stool that has a grab bar. Keep electrical cords out of the way. Do not use floor polish or wax that makes floors slippery. If you must use wax, use non-skid floor wax. Do not have throw rugs and other things on the floor that can make you trip. What can I  do with my stairs? Do not leave any items on the stairs. Make sure that there are handrails on both sides of the stairs and use them. Fix handrails that are broken or loose. Make sure that handrails are as long as the stairways. Check any carpeting to make sure that it is firmly attached to the stairs. Fix any carpet that is loose or worn. Avoid having throw rugs at the top or bottom of the stairs. If you do have throw rugs, attach them to the floor with carpet tape. Make sure that you have a light switch at the top of the stairs and the bottom of the stairs. If you do not have them, ask someone to add them for you. What else can I do to help prevent falls? Wear shoes that: Do not have high heels. Have rubber bottoms. Are comfortable and fit you well. Are closed at the toe. Do not wear sandals. If you use a stepladder: Make sure that it is fully opened. Do not climb a closed stepladder. Make sure that both sides of the  stepladder are locked into place. Ask someone to hold it for you, if possible. Clearly mark and make sure that you can see: Any grab bars or handrails. First and last steps. Where the edge of each step is. Use tools that help you move around (mobility aids) if they are needed. These include: Canes. Walkers. Scooters. Crutches. Turn on the lights when you go into a dark area. Replace any light bulbs as soon as they burn out. Set up your furniture so you have a clear path. Avoid moving your furniture around. If any of your floors are uneven, fix them. If there are any pets around you, be aware of where they are. Review your medicines with your doctor. Some medicines can make you feel dizzy. This can increase your chance of falling. Ask your doctor what other things that you can do to help prevent falls. This information is not intended to replace advice given to you by your health care provider. Make sure you discuss any questions you have with your health care provider. Document Released: 08/21/2009 Document Revised: 04/01/2016 Document Reviewed: 11/29/2014 Elsevier Interactive Patient Education  2017 [employer].

## 2023-05-26 DIAGNOSIS — S40811A Abrasion of right upper arm, initial encounter: Secondary | ICD-10-CM | POA: Diagnosis not present

## 2023-05-26 DIAGNOSIS — L570 Actinic keratosis: Secondary | ICD-10-CM | POA: Diagnosis not present

## 2023-05-26 DIAGNOSIS — L821 Other seborrheic keratosis: Secondary | ICD-10-CM | POA: Diagnosis not present

## 2023-06-08 DIAGNOSIS — H903 Sensorineural hearing loss, bilateral: Secondary | ICD-10-CM | POA: Diagnosis not present

## 2023-06-08 DIAGNOSIS — H6121 Impacted cerumen, right ear: Secondary | ICD-10-CM | POA: Diagnosis not present

## 2023-06-08 DIAGNOSIS — H90A22 Sensorineural hearing loss, unilateral, left ear, with restricted hearing on the contralateral side: Secondary | ICD-10-CM | POA: Diagnosis not present

## 2023-06-14 ENCOUNTER — Ambulatory Visit (INDEPENDENT_AMBULATORY_CARE_PROVIDER_SITE_OTHER): Payer: HMO | Admitting: Internal Medicine

## 2023-06-14 ENCOUNTER — Encounter: Payer: Self-pay | Admitting: Internal Medicine

## 2023-06-14 VITALS — BP 146/70 | HR 55 | Temp 97.9°F | Ht 60.0 in | Wt 97.6 lb

## 2023-06-14 DIAGNOSIS — R4589 Other symptoms and signs involving emotional state: Secondary | ICD-10-CM

## 2023-06-14 DIAGNOSIS — I25118 Atherosclerotic heart disease of native coronary artery with other forms of angina pectoris: Secondary | ICD-10-CM

## 2023-06-14 DIAGNOSIS — I771 Stricture of artery: Secondary | ICD-10-CM

## 2023-06-14 NOTE — Assessment & Plan Note (Signed)
Improving as she is adjusting to her current stage in life. She is relaxing more

## 2023-06-14 NOTE — Progress Notes (Signed)
Subjective:  Patient ID: [name], female    DOB: 12/29/1926  Age: 89 y.o. MRN: [id number]  CC: The primary encounter diagnosis was Anxiety about health. Diagnoses of Coronary artery disease of native heart with stable angina pectoris, unspecified vessel or lesion type [facility]) and Celiac artery stenosis (HCC) were also pertinent to this visit.   HPI [name] presents for  Chief Complaint  Patient presents with   Medical Management of Chronic Issues   1) HTN: Hypertension: patient checks blood pressure twice weekly at home.  Readings have been for the most part <140/90 at rest . Patient is following a reduced salt diet most days and is taking medications as prescribed.  She notes that her  readings have been less labile  since last visit and she is no longer having frequent episodes of light headedness   2) light headedness intermittent, usually just with sudden changes in position.  Resolves after 10 sec  3) Generalized weakness :  adjusting to this new stage in life,  doesn't like to delegate but allowing daughters to manage the cooking  ,  the yard,  the financial .  Taking her twice as long to do things    4)  CAD, PAD  feels like the circulation may have worsened in her legs notes that they tire more easily .  Has appt with Dr Wyn Quaker  at end of  August   Outpatient Medications Prior to Visit  Medication Sig Dispense Refill   acetaminophen (TYLENOL) 500 MG tablet Take 500 mg by mouth every 6 (six) hours as needed for mild pain.     Alfalfa 500 MG TABS Take 500 mg by mouth daily.     ALPRAZolam (XANAX) 0.25 MG tablet Take 0.5 tablets (0.125 mg total) by mouth as needed for anxiety (for anxiety or elevated blood pressure). 20 tablet 0   amLODipine (NORVASC) 2.5 MG tablet Take 1 tablet (2.5 mg total) by mouth daily. As needed for systolic pressure > 150 90 tablet 0   Ascorbic Acid (VITAMIN C CR) 500 MG CPCR Take 500 mg by mouth daily.     aspirin EC 81 MG tablet Take 1  tablet (81 mg total) by mouth daily. Swallow whole. 90 tablet 2   Calcium Carbonate (CALCIUM 600 PO) Take by mouth.     Cholecalciferol (VITAMIN D3) 3000 UNITS TABS Take 3,000 Units by mouth 2 (two) times daily.      clotrimazole-betamethasone (LOTRISONE) cream Apply topically 2 (two) times daily. 30 g 1   diclofenac sodium (VOLTAREN) 1 % GEL Apply topically as needed.     fluticasone (FLONASE) 50 MCG/ACT nasal spray Place 1 spray into both nostrils daily.     Ginkgo Biloba (GNP GINGKO BILOBA EXTRACT PO) Take 1 tablet by mouth daily.     Glucosamine Sulfate 500 MG CAPS Take 500 mg by mouth daily.     hydrALAZINE (APRESOLINE) 10 MG tablet Take 10 mg by mouth as needed.     isosorbide mononitrate (IMDUR) 30 MG 24 hr tablet Take 1 tablet (30 mg total) by mouth daily. 90 tablet 1   Lactobacillus (PROBIOTIC ACIDOPHILUS PO) Take 1 capsule by mouth daily.      loratadine (CLARITIN) 10 MG tablet Take 10 mg by mouth daily as needed for allergies.     Magnesium 400 MG CAPS Take 400 mg by mouth daily.     metoprolol succinate (TOPROL XL) 25 MG 24 hr tablet Take 0.5 tablets (12.5 mg total)  by mouth daily. 45 tablet 3   Multiple Vitamin (MULTIVITAMIN) capsule Take 1 capsule by mouth daily.     mupirocin ointment (BACTROBAN) 2 % Apply to right leg wound daily - a small dab as needed once a day. 15 g 0   nitroGLYCERIN (NITROSTAT) 0.4 MG SL tablet DISSOLVE ONE TABLET UNDER THE TONGUE EVERY 5 MINUTES AS NEEDED FOR CHEST PAIN.  DO NOT EXCEED A TOTAL OF 3 DOSES IN 15 MINUTES 50 tablet 0   nystatin cream (MYCOSTATIN) Apply twice daily to corners of lips until redness resolves 15 g 2   Omega-3 Fatty Acids (OMEGA 3 PO) Take 520 mg by mouth daily.      Pramoxine-HC (HYDROCORTISONE ACE-PRAMOXINE) 2.5-1 % CREA Apply topically as needed.      rosuvastatin (CRESTOR) 10 MG tablet Take 1 tablet (10 mg total) by mouth every other day. 45 tablet 1   No facility-administered medications prior to visit.    Review of  Systems;  Patient denies headache, fevers, malaise, unintentional weight loss, skin rash, eye pain, sinus congestion and sinus pain, sore throat, dysphagia,  hemoptysis , cough, dyspnea, wheezing, chest pain, palpitations, orthopnea, edema, abdominal pain, nausea, melena, diarrhea, constipation, flank pain, dysuria, hematuria, urinary  Frequency, nocturia, numbness, tingling, seizures,  Focal weakness, Loss of consciousness,  Tremor, insomnia, depression, anxiety, and suicidal ideation.      Objective:  BP (!) 146/70   Pulse (!) 55   Temp 97.9 F (36.6 C) (Oral)   Ht 5' (1.524 m)   Wt 97 lb 9.6 oz (44.3 kg)   SpO2 98%   BMI 19.06 kg/m   BP Readings from Last 3 Encounters:  06/14/23 (!) 146/70  03/31/23 (!) 164/78  03/29/23 (!) 152/88    Wt Readings from Last 3 Encounters:  06/14/23 97 lb 9.6 oz (44.3 kg)  05/20/23 98 lb 4.8 oz (44.6 kg)  03/31/23 99 lb 14.4 oz (45.3 kg)    Physical Exam Vitals reviewed.  Constitutional:      General: She is not in acute distress.    Appearance: Normal appearance. She is normal weight. She is not ill-appearing, toxic-appearing or diaphoretic.  HENT:     Head: Normocephalic.  Eyes:     General: No scleral icterus.       Right eye: No discharge.        Left eye: No discharge.     Conjunctiva/sclera: Conjunctivae normal.  Cardiovascular:     Rate and Rhythm: Normal rate and regular rhythm.     Heart sounds: Normal heart sounds.  Pulmonary:     Effort: Pulmonary effort is normal. No respiratory distress.     Breath sounds: Normal breath sounds.  Musculoskeletal:        General: Normal range of motion.  Skin:    General: Skin is warm and dry.  Neurological:     General: No focal deficit present.     Mental Status: She is alert and oriented to person, place, and time. Mental status is at baseline.  Psychiatric:        Mood and Affect: Mood normal.        Behavior: Behavior normal.        Thought Content: Thought content normal.         Judgment: Judgment normal.     Lab Results  Component Value Date   HGBA1C 7.0 (H) 11/16/2021   HGBA1C 6.1 (H) 05/14/2019   HGBA1C 6.2 (H) 01/01/2019    Lab Results  Component  Value Date   CREATININE 0.71 04/19/2023   CREATININE 0.62 03/10/2023   CREATININE 0.70 09/15/2022    Lab Results  Component Value Date   WBC 5.5 03/10/2023   HGB 14.1 03/10/2023   HCT 42.3 03/10/2023   PLT 205.0 03/10/2023   GLUCOSE 145 (H) 04/19/2023   CHOL 193 03/10/2023   TRIG 119.0 03/10/2023   HDL 73.40 03/10/2023   LDLDIRECT 96.0 03/10/2023   LDLCALC 96 03/10/2023   ALT 21 04/19/2023   AST 40 (H) 04/19/2023   NA 137 04/19/2023   K 3.9 04/19/2023   CL 100 04/19/2023   CREATININE 0.71 04/19/2023   BUN 18 04/19/2023   CO2 30 04/19/2023   TSH 2.13 03/10/2023   HGBA1C 7.0 (H) 11/16/2021   MICROALBUR <0.7 11/16/2021    No results found.  Assessment & Plan:  .Anxiety about health Assessment & Plan: Improving as she is adjusting to her current stage in life. She is relaxing more   Coronary artery disease of native heart with stable angina pectoris, unspecified vessel or lesion type [facility]) Assessment & Plan: She remains asymptomatic .    Low risk stress test results.  Continue medical management.    Celiac artery stenosis [facility]) Assessment & Plan: Vascular follow up  in one month.  Reviewed prior doppler noting Normal velocities.         Follow-up: No follow-ups on file.   Sherlene Shams, MD

## 2023-06-14 NOTE — Assessment & Plan Note (Signed)
Vascular follow up  in one month.  Reviewed prior doppler noting Normal velocities.

## 2023-06-14 NOTE — Patient Instructions (Signed)
I would keep the appointment with Dr Wyn Quaker since your legs have been bothering you more,  to get the circulation checked  If the circulation is unchanged,  you can request that the visits be changed to annually  We did not make any medication changes today , because you are doing well

## 2023-06-14 NOTE — Assessment & Plan Note (Signed)
She remains asymptomatic .    Low risk stress test results.  Continue medical management.

## 2023-06-28 ENCOUNTER — Other Ambulatory Visit: Payer: Self-pay

## 2023-06-28 ENCOUNTER — Ambulatory Visit (INDEPENDENT_AMBULATORY_CARE_PROVIDER_SITE_OTHER): Payer: HMO | Admitting: Vascular Surgery

## 2023-06-28 ENCOUNTER — Encounter: Payer: Self-pay | Admitting: Emergency Medicine

## 2023-06-28 ENCOUNTER — Ambulatory Visit (INDEPENDENT_AMBULATORY_CARE_PROVIDER_SITE_OTHER): Payer: HMO

## 2023-06-28 ENCOUNTER — Emergency Department
Admission: EM | Admit: 2023-06-28 | Discharge: 2023-06-28 | Disposition: A | Discharge status: Home / Self Care | Payer: HMO | Attending: Emergency Medicine | Admitting: Emergency Medicine

## 2023-06-28 ENCOUNTER — Encounter (INDEPENDENT_AMBULATORY_CARE_PROVIDER_SITE_OTHER): Payer: Self-pay | Admitting: Vascular Surgery

## 2023-06-28 VITALS — BP 161/77 | HR 54 | Resp 18 | Ht 60.0 in | Wt 98.0 lb

## 2023-06-28 DIAGNOSIS — I700 Atherosclerosis of aorta: Secondary | ICD-10-CM

## 2023-06-28 DIAGNOSIS — I771 Stricture of artery: Secondary | ICD-10-CM | POA: Diagnosis not present

## 2023-06-28 DIAGNOSIS — T63441A Toxic effect of venom of bees, accidental (unintentional), initial encounter: Secondary | ICD-10-CM | POA: Diagnosis not present

## 2023-06-28 DIAGNOSIS — T63481A Toxic effect of venom of other arthropod, accidental (unintentional), initial encounter: Secondary | ICD-10-CM

## 2023-06-28 DIAGNOSIS — M79604 Pain in right leg: Secondary | ICD-10-CM | POA: Diagnosis not present

## 2023-06-28 DIAGNOSIS — I10 Essential (primary) hypertension: Secondary | ICD-10-CM | POA: Diagnosis not present

## 2023-06-28 DIAGNOSIS — M79605 Pain in left leg: Secondary | ICD-10-CM

## 2023-06-28 DIAGNOSIS — R21 Rash and other nonspecific skin eruption: Secondary | ICD-10-CM | POA: Diagnosis present

## 2023-06-28 DIAGNOSIS — M79609 Pain in unspecified limb: Secondary | ICD-10-CM | POA: Insufficient documentation

## 2023-06-28 DIAGNOSIS — T7840XA Allergy, unspecified, initial encounter: Secondary | ICD-10-CM | POA: Insufficient documentation

## 2023-06-28 DIAGNOSIS — E782 Mixed hyperlipidemia: Secondary | ICD-10-CM

## 2023-06-28 MED ORDER — ACETAMINOPHEN 500 MG PO TABS
1000.0000 mg | ORAL_TABLET | Freq: Once | ORAL | Status: AC
  Administered 2023-06-28: 1000 mg via ORAL
  Filled 2023-06-28: qty 2

## 2023-06-28 MED ORDER — FAMOTIDINE IN NACL 20-0.9 MG/50ML-% IV SOLN
20.0000 mg | Freq: Once | INTRAVENOUS | Status: AC
  Administered 2023-06-28: 20 mg via INTRAVENOUS
  Filled 2023-06-28: qty 50

## 2023-06-28 MED ORDER — DIPHENHYDRAMINE HCL 50 MG/ML IJ SOLN
25.0000 mg | Freq: Once | INTRAMUSCULAR | Status: AC
  Administered 2023-06-28: 25 mg via INTRAVENOUS
  Filled 2023-06-28: qty 1

## 2023-06-28 MED ORDER — METOPROLOL SUCCINATE ER 25 MG PO TB24
12.5000 mg | ORAL_TABLET | Freq: Every day | ORAL | Status: DC
  Administered 2023-06-28: 12.5 mg via ORAL
  Filled 2023-06-28: qty 1

## 2023-06-28 MED ORDER — ISOSORBIDE MONONITRATE ER 60 MG PO TB24
30.0000 mg | ORAL_TABLET | Freq: Once | ORAL | Status: AC
  Administered 2023-06-28: 30 mg via ORAL
  Filled 2023-06-28: qty 1

## 2023-06-28 MED ORDER — HYDRALAZINE HCL 10 MG PO TABS
10.0000 mg | ORAL_TABLET | Freq: Once | ORAL | Status: AC
  Administered 2023-06-28: 10 mg via ORAL
  Filled 2023-06-28: qty 1

## 2023-06-28 MED ORDER — AMLODIPINE BESYLATE 5 MG PO TABS
2.5000 mg | ORAL_TABLET | Freq: Once | ORAL | Status: AC
  Administered 2023-06-28: 2.5 mg via ORAL
  Filled 2023-06-28: qty 1

## 2023-06-28 NOTE — Assessment & Plan Note (Signed)
Her mesenteric duplex today demonstrates a widely patent celiac artery stent and normal velocities in her superior mesenteric artery.  We are now over 5 years after her celiac artery stent placement.  I will go to following this annually at this point.  Contact our office with any visceral or worrisome symptoms in the interim.

## 2023-06-28 NOTE — ED Provider Notes (Signed)
[facility] Provider Note    Event Date/Time   First MD Initiated Contact with Patient 06/28/23 1924     (approximate)   History   Allergic Reaction   HPI  [name] is a 89 y.o. female past medical history significant for hypertension who presents to the emergency department after being stung by multiple bees.  States that she was at home and was out front trying to remove an old railroad tie that we had rotted out.  When she picked it up stated that there was a nest of wasps or bees, stung multiple times and all over her body.  Multiple spots of redness, swelling and feeling like she was stinging.  Denies any shortness of breath.  Denies any nausea or vomiting, diarrhea or abdominal pain.  Denies any lightheadedness.  Significantly hypertensive.  States that she just feels very jittery and jumping around.  Has not yet taken her home nighttime antihypertensive medications and is on at least 4 medications.     Physical Exam   Triage Vital Signs: ED Triage Vitals [06/28/23 1802]  Encounter Vitals Group     BP (!) 202/132     Systolic BP Percentile      Diastolic BP Percentile      Pulse Rate 92     Resp 20     Temp 97.6 F (36.4 C)     Temp src      SpO2 94 %     Weight      Height      Head Circumference      Peak Flow      Pain Score      Pain Loc      Pain Education      Exclude from Growth Chart     Most recent vital signs: Vitals:   06/28/23 2230 06/28/23 2242  BP:    Pulse: 75   Resp: (!) 24   Temp:  98.3 F (36.8 C)  SpO2: 96%     Physical Exam Constitutional:      Appearance: She is well-developed.  HENT:     Head: Atraumatic.  Eyes:     Conjunctiva/sclera: Conjunctivae normal.  Cardiovascular:     Rate and Rhythm: Regular rhythm.  Pulmonary:     Effort: No respiratory distress.  Abdominal:     General: There is no distension.  Musculoskeletal:        General: Normal range of motion.     Cervical back:  Normal range of motion.  Skin:    General: Skin is warm.     Findings: Rash present.     Comments: Multiple areas of redness with a raised rash to bilateral ears, left lower back, right hand, right forearm, left shoulder  Neurological:     Mental Status: She is alert. Mental status is at baseline.     IMPRESSION / MDM / ASSESSMENT AND PLAN / ED COURSE  I reviewed the triage vital signs and the nursing notes.  Differential diagnosis including hymenoptera sting from envenomation, serum sickness, clinical picture is not consistent with anaphylactic reaction  EKG  I, Corena Herter, the attending physician, personally viewed and interpreted this ECG.   Rate: Normal  Rhythm: Normal sinus  Axis: Normal  Intervals: Normal  ST&T Change: Nonspecific ST changes.  PACs noted.  No tachycardic or bradycardic dysrhythmias while on cardiac telemetry.   LABS (all labs ordered are listed, but only abnormal results are displayed) Labs interpreted  as -    Labs Reviewed - No data to display   MDM  Patient significantly hypertensive on arrival.  Patient was given her home antihypertensive medications that she takes at nighttime.  Given Benadryl, Pepcid.  Patient has an allergy to steroids so we will avoid any steroids.  Does not meet criteria for anaphylaxis and does not need epinephrine at this time.  Given Tylenol for pain control.  On reevaluation significant improvement of her symptoms and no longer itching.  Continues to protect her airway.  Discussed admission for observation versus discharge home with symptomatic treatment.  Patient states that she wants to go home and does not want a wait in the hospital.  Discussed return precautions.  Discussed Benadryl and Tylenol.  No questions at time of discharge.  Blood pressure significantly improved with her home antihypertensive medications.  No chest pain or shortness of breath.     PROCEDURES:  Critical Care performed:  No  Procedures  Patient's presentation is most consistent with acute illness / injury with system symptoms.   MEDICATIONS ORDERED IN ED: Medications  metoprolol succinate (TOPROL-XL) 24 hr tablet 12.5 mg (12.5 mg Oral Given 06/28/23 2018)  diphenhydrAMINE (BENADRYL) injection 25 mg (25 mg Intravenous Given 06/28/23 2021)  famotidine (PEPCID) IVPB 20 mg premix (0 mg Intravenous Stopped 06/28/23 2115)  amLODipine (NORVASC) tablet 2.5 mg (2.5 mg Oral Given 06/28/23 2017)  hydrALAZINE (APRESOLINE) tablet 10 mg (10 mg Oral Given 06/28/23 2020)  isosorbide mononitrate (IMDUR) 24 hr tablet 30 mg (30 mg Oral Given 06/28/23 2139)  acetaminophen (TYLENOL) tablet 1,000 mg (1,000 mg Oral Given 06/28/23 2233)    FINAL CLINICAL IMPRESSION(S) / ED DIAGNOSES   Final diagnoses:  Allergic reaction, initial encounter  Hymenoptera reaction     Rx / DC Orders   ED Discharge Orders     None        Note:  This document was prepared using Dragon voice recognition software and may include unintentional dictation errors.   Corena Herter, MD 06/28/23 2337

## 2023-06-28 NOTE — Assessment & Plan Note (Signed)
The patient is describing pain that is more consistent with neuropathic pain of the feet, but with a history of vascular disease elsewhere and not having checked her lower extremity arterial perfusion within the past year, I think it is reasonable to assess ABIs in the near future at her convenience.

## 2023-06-28 NOTE — Discharge Instructions (Signed)
You were seen in the emergency department after multiple insect stings.  You broke out into hives.  You were given Benadryl and Pepcid.  You are also given Tylenol.  Your blood pressure was significantly elevated and improved with your blood pressure medications.  You can take 25 mg of Benadryl every 8 hours as needed for itching.  You can take Tylenol for pain control and for pain secondary to the stingers.  Acetaminophen (Tylenol) 1000 mg every 6 hours as needed for pain control.  Follow-up closely with your primary care physician as needed.  Return to the emergency department for any worsening symptoms.

## 2023-06-28 NOTE — ED Triage Notes (Signed)
Pt via POV from home. Pt was stung multiple times by bees. Pt has a hx of allergy to bees but never needed any EpiPen for same. Pt has multiple spots with redness and swelling. Pt hypertensive on arrival, with a hx takes medication at night denies missing any recent doses. Denies any headache, dizziness, or lightheadedness. Denies any difficulty swallowing. Denies any CP. Pt states she just feels "jittery.' Pt is A&OX4 and NAD

## 2023-06-28 NOTE — Progress Notes (Signed)
MRN : [id number]  [name] is a 89 y.o. (Jan 21, 1927) female who presents with chief complaint of  Chief Complaint  Patient presents with   Follow-up    fu in 6 months with mesenteric  .  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  She is doing well from a visceral standpoint with no significant postprandial abdominal pain or unintentional weight loss.  Her biggest complaint today is that she feels like her feet and legs are much worse and she continues to have cramping and pain.  This is worsened with activity.  We have previously checked her ABIs and they were normal but this has been at least a year and a half ago.  She does have significant aortic atherosclerosis and is very concerned about the circulation in her legs.  She also feels that she had an implant placed at the time of her ultrasound in our office about 6 months ago.  She says there was something in her navel that she got out the following day.  We discussed that the only thing done in our office was an ultrasound and there could have been some ultrasound jelly which congealed but we generally try to wipe that off as best possible.  This was certainly not a procedural implant or any type of device that was placed. Her mesenteric duplex today demonstrates a widely patent celiac artery stent and normal velocities in her superior mesenteric artery.  Current Outpatient Medications  Medication Sig Dispense Refill   acetaminophen (TYLENOL) 500 MG tablet Take 500 mg by mouth every 6 (six) hours as needed for mild pain.     Alfalfa 500 MG TABS Take 500 mg by mouth daily.     ALPRAZolam (XANAX) 0.25 MG tablet Take 0.5 tablets (0.125 mg total) by mouth as needed for anxiety (for anxiety or elevated blood pressure). 20 tablet 0   amLODipine (NORVASC) 2.5 MG tablet Take 1 tablet (2.5 mg total) by mouth daily. As needed for  systolic pressure > 150 90 tablet 0   Ascorbic Acid (VITAMIN C CR) 500 MG CPCR Take 500 mg by mouth daily.     aspirin EC 81 MG tablet Take 1 tablet (81 mg total) by mouth daily. Swallow whole. 90 tablet 2   Calcium Carbonate (CALCIUM 600 PO) Take by mouth.     Cholecalciferol (VITAMIN D3) 3000 UNITS TABS Take 3,000 Units by mouth 2 (two) times daily.      clotrimazole-betamethasone (LOTRISONE) cream Apply topically 2 (two) times daily. 30 g 1   diclofenac sodium (VOLTAREN) 1 % GEL Apply topically as needed.     fluticasone (FLONASE) 50 MCG/ACT nasal spray Place 1 spray into both nostrils daily.     Ginkgo Biloba (GNP GINGKO BILOBA EXTRACT PO) Take 1 tablet by mouth daily.     Glucosamine Sulfate 500 MG CAPS Take 500 mg by mouth daily.     hydrALAZINE (APRESOLINE) 10 MG tablet Take 10 mg by mouth as needed.     isosorbide mononitrate (IMDUR) 30 MG 24 hr tablet Take 1 tablet (30 mg total) by mouth daily. 90 tablet 1   Lactobacillus (PROBIOTIC ACIDOPHILUS PO) Take 1 capsule by mouth daily.      loratadine (CLARITIN) 10 MG tablet Take 10 mg by mouth daily as needed for allergies.     Magnesium 400 MG CAPS Take 400 mg by mouth daily.  metoprolol succinate (TOPROL XL) 25 MG 24 hr tablet Take 0.5 tablets (12.5 mg total) by mouth daily. 45 tablet 3   Multiple Vitamin (MULTIVITAMIN) capsule Take 1 capsule by mouth daily.     mupirocin ointment (BACTROBAN) 2 % Apply to right leg wound daily - a small dab as needed once a day. 15 g 0   nitroGLYCERIN (NITROSTAT) 0.4 MG SL tablet DISSOLVE ONE TABLET UNDER THE TONGUE EVERY 5 MINUTES AS NEEDED FOR CHEST PAIN.  DO NOT EXCEED A TOTAL OF 3 DOSES IN 15 MINUTES 50 tablet 0   nystatin cream (MYCOSTATIN) Apply twice daily to corners of lips until redness resolves 15 g 2   Omega-3 Fatty Acids (OMEGA 3 PO) Take 520 mg by mouth daily.      Pramoxine-HC (HYDROCORTISONE ACE-PRAMOXINE) 2.5-1 % CREA Apply topically as needed.      rosuvastatin (CRESTOR) 10 MG tablet  Take 1 tablet (10 mg total) by mouth every other day. 45 tablet 1   No current facility-administered medications for this visit.    Past Medical History:  Diagnosis Date   Acute COVID-19 06/04/2021   Allergy    Arthritis    Atherosclerosis    Atherosclerosis of abdominal aorta (HCC)    Chronic kidney disease    stage 2   CKD (chronic kidney disease) stage 2, GFR 60-89 ml/min    Collagen vascular disease (HCC)    Compression fracture of thoracic spine, non-traumatic (HCC) 08/19/2015   Seen on plain films October 2016   Diverticulosis 2008   GERD (gastroesophageal reflux disease)    Heart murmur    Hyperlipidemia    Hypertensive urgency 05/04/2022   Lumbar spinal stenosis    Osteoporosis    Ovarian lump    RA (rheumatoid arthritis) (HCC)    Raynaud's disease    Rheumatoid arthritis (HCC)    Spinal stenosis     Past Surgical History:  Procedure Laterality Date   BREAST SURGERY Left 1965   Benign biopsy   CHOLECYSTECTOMY  2005   [place]   COLONOSCOPY  2008   Dr. Ricki Rodriguez   EXCISION OF BREAST BIOPSY Left 1965   benign   EYE SURGERY Bilateral    Cataract Extraction with IOL   KYPHOPLASTY N/A 12/17/2016   Procedure: KYPHOPLASTY;  Surgeon: Kennedy Bucker, MD;  Location: [facility];  Service: Orthopedics;  Laterality: N/A;  l4   LUMBAR LAMINECTOMY  05/09/2017   L2-L5   VISCERAL ANGIOGRAPHY N/A 08/15/2017   Procedure: VISCERAL ANGIOGRAPHY;  Surgeon: Annice Needy, MD;  Location: [facility];  Service: Cardiovascular;  Laterality: N/A;   VISCERAL ANGIOGRAPHY N/A 10/05/2017   Procedure: VISCERAL ANGIOGRAPHY;  Surgeon: Annice Needy, MD;  Location: [facility];  Service: Cardiovascular;  Laterality: N/A;   VISCERAL ARTERY INTERVENTION N/A 08/15/2017   Procedure: VISCERAL ARTERY INTERVENTION;  Surgeon: Annice Needy, MD;  Location: [facility];  Service: Cardiovascular;  Laterality: N/A;     Social History   Tobacco Use   Smoking status: Never    Smokeless tobacco: Never   Tobacco comments:    smoking cessation materials not required  Vaping Use   Vaping status: Never Used  Substance Use Topics   Alcohol use: No    Alcohol/week: 0.0 standard drinks of alcohol   Drug use: No       Family History  Problem Relation Age of Onset   Heart disease Mother    Hypertension Father    Heart disease  Brother 52       heart attack   Cancer Brother        bladder   Diabetes Sister    Alcohol abuse Sister    Kidney disease Sister    Diabetes Brother    Stroke Brother    Diabetes Maternal Grandmother    Heart disease Maternal Grandmother    Cancer Brother        lung     Allergies  Allergen Reactions   Ibandronic Acid Other (See Comments)    Achy all over. Flu like S/S   Other Other (See Comments)    Achy all over. Flu like S/S Dysphagia   Prednisone Rash   Actonel [Risedronate] Other (See Comments)    Dysphagia   Amoxicillin Hives   Pantoprazole Other (See Comments)    Little blistery bumps   Raloxifene Other (See Comments)    Mood swings   Versed [Midazolam] Other (See Comments)    Difficult waking up and memory loss   Doxycycline Rash     REVIEW OF SYSTEMS (Negative unless checked)   Constitutional: [x] Weight loss  [] Fever  [] Chills Cardiac: [] Chest pain   [] Chest pressure   [x] Palpitations   [] Shortness of breath when laying flat   [] Shortness of breath at rest   [] Shortness of breath with exertion. Vascular:  [x] Pain in legs with walking   [] Pain in legs at rest   [] Pain in legs when laying flat   [] Claudication   [] Pain in feet when walking  [x] Pain in feet at rest  [] Pain in feet when laying flat   [] History of DVT   [] Phlebitis   [] Swelling in legs   [x] Varicose veins   [] Non-healing ulcers Pulmonary:   [] Uses home oxygen   [] Productive cough   [] Hemoptysis   [] Wheeze  [] COPD   [] Asthma Neurologic:  [] Dizziness  [] Blackouts   [] Seizures   [] History of stroke   [] History of TIA  [] Aphasia   [] Temporary  blindness   [] Dysphagia   [] Weakness or numbness in arms   [] Weakness or numbness in legs Musculoskeletal:  [x] Arthritis   [] Joint swelling   [] Joint pain   [x] Low back pain Hematologic:  [] Easy bruising  [] Easy bleeding   [] Hypercoagulable state   [] Anemic   Gastrointestinal:  [] Blood in stool   [] Vomiting blood  [x] Gastroesophageal reflux/heartburn   [x] Abdominal pain Genitourinary:  [x] Chronic kidney disease   [] Difficult urination  [] Frequent urination  [] Burning with urination   [] Hematuria Skin:  [] Rashes   [] Ulcers   [] Wounds Psychological:  [x] History of anxiety   []  History of major depression.  Physical Examination  BP (!) 161/77 (BP Location: Right Arm)   Pulse (!) 54   Resp 18   Ht 5' (1.524 m)   Wt 98 lb (44.5 kg)   BMI 19.14 kg/m  Gen:  WD/WN, NAD. Appears younger than stated age. Head: [place]/AT, No temporalis wasting. Ear/Nose/Throat: Hearing grossly intact, nares w/o erythema or drainage Eyes: Conjunctiva clear. Sclera non-icteric Neck: Supple.  Trachea midline Pulmonary:  Good air movement, no use of accessory muscles.  Cardiac: RRR, no JVD Vascular:  Vessel Right Left  Radial Palpable Palpable                          PT Palpable Palpable  DP Palpable Palpable   Gastrointestinal: soft, non-tender/non-distended. No guarding/reflex.  Musculoskeletal: M/S 5/5 throughout.  No deformity or atrophy. No edema. Neurologic: Sensation grossly intact in extremities.  Symmetrical.  Speech is fluent.  Psychiatric: Judgment intact, Mood & affect appropriate for pt's clinical situation. Dermatologic: No rashes or ulcers noted.  No cellulitis or open wounds.      Labs Recent Results (from the past 2160 hour(s))  Comprehensive metabolic panel     Status: Abnormal   Collection Time: 04/19/23  1:55 PM  Result Value Ref Range   Sodium 137 135 - 145 mEq/L   Potassium 3.9 3.5 - 5.1 mEq/L   Chloride 100 96 - 112 mEq/L   CO2 30 19 - 32 mEq/L   Glucose, Bld 145 (H) 70 -  99 mg/dL   BUN 18 6 - 23 mg/dL   Creatinine, Ser 1.61 0.40 - 1.20 mg/dL   Total Bilirubin 0.5 0.2 - 1.2 mg/dL   Alkaline Phosphatase 55 39 - 117 U/L   AST 40 (H) 0 - 37 U/L   ALT 21 0 - 35 U/L   Total Protein 7.5 6.0 - 8.3 g/dL   Albumin 4.4 3.5 - 5.2 g/dL   GFR 09.60 >45.40 mL/min    Comment: Calculated using the CKD-EPI Creatinine Equation (2021)   Calcium 9.7 8.4 - 10.5 mg/dL    Radiology No results found.  Assessment/Plan Hyperlipidemia lipid control important in reducing the progression of atherosclerotic disease.    Atherosclerosis of abdominal aorta (HCC) Known and not the cause of her abdominal pain.  I will reassess her lower extremities with ABIs to ensure that she is not having more malperfusion causing leg pain.   Essential hypertension blood pressure control important in reducing the progression of atherosclerotic disease. On appropriate oral medications.  Celiac artery stenosis (HCC) Her mesenteric duplex today demonstrates a widely patent celiac artery stent and normal velocities in her superior mesenteric artery.  We are now over 5 years after her celiac artery stent placement.  I will go to following this annually at this point.  Contact our office with any visceral or worrisome symptoms in the interim.  Pain in limb The patient is describing pain that is more consistent with neuropathic pain of the feet, but with a history of vascular disease elsewhere and not having checked her lower extremity arterial perfusion within the past year, I think it is reasonable to assess ABIs in the near future at her convenience.    Festus Barren, MD  06/28/2023 9:27 AM    This note was created with Dragon medical transcription system.  Any errors from dictation are purely unintentional

## 2023-07-12 ENCOUNTER — Other Ambulatory Visit (INDEPENDENT_AMBULATORY_CARE_PROVIDER_SITE_OTHER): Payer: Self-pay | Admitting: Vascular Surgery

## 2023-07-12 DIAGNOSIS — I700 Atherosclerosis of aorta: Secondary | ICD-10-CM

## 2023-07-12 DIAGNOSIS — M79604 Pain in right leg: Secondary | ICD-10-CM

## 2023-07-13 ENCOUNTER — Encounter (INDEPENDENT_AMBULATORY_CARE_PROVIDER_SITE_OTHER): Payer: Self-pay | Admitting: Nurse Practitioner

## 2023-07-13 ENCOUNTER — Ambulatory Visit (INDEPENDENT_AMBULATORY_CARE_PROVIDER_SITE_OTHER): Payer: HMO

## 2023-07-13 ENCOUNTER — Ambulatory Visit (INDEPENDENT_AMBULATORY_CARE_PROVIDER_SITE_OTHER): Payer: HMO | Admitting: Nurse Practitioner

## 2023-07-13 VITALS — BP 188/82 | HR 51 | Resp 15 | Wt 97.4 lb

## 2023-07-13 DIAGNOSIS — I700 Atherosclerosis of aorta: Secondary | ICD-10-CM

## 2023-07-13 DIAGNOSIS — I10 Essential (primary) hypertension: Secondary | ICD-10-CM | POA: Diagnosis not present

## 2023-07-13 DIAGNOSIS — I771 Stricture of artery: Secondary | ICD-10-CM

## 2023-07-13 DIAGNOSIS — M79604 Pain in right leg: Secondary | ICD-10-CM

## 2023-07-13 DIAGNOSIS — M48061 Spinal stenosis, lumbar region without neurogenic claudication: Secondary | ICD-10-CM

## 2023-07-13 DIAGNOSIS — M79605 Pain in left leg: Secondary | ICD-10-CM | POA: Diagnosis not present

## 2023-07-14 ENCOUNTER — Encounter (INDEPENDENT_AMBULATORY_CARE_PROVIDER_SITE_OTHER): Payer: Self-pay | Admitting: Nurse Practitioner

## 2023-07-14 LAB — VAS US ABI WITH/WO TBI
Left ABI: 1.01
Right ABI: 0.99

## 2023-07-14 NOTE — Progress Notes (Signed)
Subjective:    Patient ID: [name], female    DOB: 09/23/1927, 89 y.o.   MRN: [id number] Chief Complaint  Patient presents with   Follow-up    Ultrasound follow up    [name] is a 89 year old female who returns today for noninvasive studies related to her lower extremity perfusion.  She has recently been having some pain in her legs as well as some stiffness of the ankle areas.  She is concerned that this may be related to her arteries and decreased circulation.  Her symptoms are not quite consistent with claudication at this time.  She currently also has no open wounds or ulcerations.  Today the patient has an ABI of 0.99 on the right and 1.01 on the left.  She has a toe pressure of 116 on the right and 72 on the left.  Additional bilateral arterial duplexes show primarily biphasic waveforms throughout the bilateral lower extremities.  There is no hemodynamically significant stenosis identified.  It is also noted that the patient does have some venous reflux noted in the left great saphenous vein incidentally.    Review of Systems  Cardiovascular:  Negative for leg swelling.  Musculoskeletal:  Positive for arthralgias.  Skin:  Negative for wound.  All other systems reviewed and are negative.      Objective:   Physical Exam Vitals reviewed.  HENT:     Head: Normocephalic.  Cardiovascular:     Rate and Rhythm: Normal rate.     Pulses:          Dorsalis pedis pulses are detected w/ Doppler on the right side and detected w/ Doppler on the left side.       Posterior tibial pulses are detected w/ Doppler on the right side and detected w/ Doppler on the left side.  Skin:    General: Skin is warm and dry.  Neurological:     Mental Status: She is alert and oriented to person, place, and time.  Psychiatric:        Mood and Affect: Mood normal.        Behavior: Behavior normal.        Thought Content: Thought content normal.        Judgment: Judgment normal.      BP (!) 188/82 (BP Location: Left Arm)   Pulse (!) 51   Resp 15   Wt 97 lb 6.4 oz (44.2 kg)   BMI 19.02 kg/m   Past Medical History:  Diagnosis Date   Acute COVID-19 06/04/2021   Allergy    Arthritis    Atherosclerosis    Atherosclerosis of abdominal aorta (HCC)    Chronic kidney disease    stage 2   CKD (chronic kidney disease) stage 2, GFR 60-89 ml/min    Collagen vascular disease (HCC)    Compression fracture of thoracic spine, non-traumatic (HCC) 08/19/2015   Seen on plain films October 2016   Diverticulosis 2008   GERD (gastroesophageal reflux disease)    Heart murmur    Hyperlipidemia    Hypertensive urgency 05/04/2022   Lumbar spinal stenosis    Osteoporosis    Ovarian lump    RA (rheumatoid arthritis) (HCC)    Raynaud's disease    Rheumatoid arthritis (HCC)    Spinal stenosis     Social History   Socioeconomic History   Marital status: Widowed    Spouse name: Chrissie Noa   Number of children: 3   Years of education: some college  Highest education level: Associate degree: academic program  Occupational History    Employer: RETIRED  Tobacco Use   Smoking status: Never   Smokeless tobacco: Never   Tobacco comments:    smoking cessation materials not required  Vaping Use   Vaping status: Never Used  Substance and Sexual Activity   Alcohol use: No    Alcohol/week: 0.0 standard drinks of alcohol   Drug use: No   Sexual activity: Not Currently  Other Topics Concern   Not on file  Social History Narrative   Widowed   3 daughter   Social Determinants of Health   Financial Resource Strain: Low Risk  (05/20/2023)   Overall Financial Resource Strain (CARDIA)    Difficulty of Paying Living Expenses: Not hard at all  Food Insecurity: No Food Insecurity (05/20/2023)   Hunger Vital Sign    Worried About Running Out of Food in the Last Year: Never true    Ran Out of Food in the Last Year: Never true  Transportation Needs: No Transportation Needs  (05/20/2023)   PRAPARE - Administrator, Civil Service (Medical): No    Lack of Transportation (Non-Medical): No  Physical Activity: Inactive (05/20/2023)   Exercise Vital Sign    Days of Exercise per Week: 0 days    Minutes of Exercise per Session: 0 min  Stress: No Stress Concern Present (05/20/2023)   [employer] of Occupational Health - Occupational Stress Questionnaire    Feeling of Stress : Only a little  Social Connections: Moderately Isolated (05/20/2023)   Social Connection and Isolation Panel [NHANES]    Frequency of Communication with Friends and Family: More than three times a week    Frequency of Social Gatherings with Friends and Family: More than three times a week    Attends Religious Services: Never    Database administrator or Organizations: No    Attends Engineer, structural: More than 4 times per year    Marital Status: Widowed  Intimate Partner Violence: Not At Risk (05/20/2023)   Humiliation, Afraid, Rape, and Kick questionnaire    Fear of Current or Ex-Partner: No    Emotionally Abused: No    Physically Abused: No    Sexually Abused: No    Past Surgical History:  Procedure Laterality Date   BREAST SURGERY Left 1965   Benign biopsy   CHOLECYSTECTOMY  2005   [place]   COLONOSCOPY  2008   Dr. Ricki Rodriguez   EXCISION OF BREAST BIOPSY Left 1965   benign   EYE SURGERY Bilateral    Cataract Extraction with IOL   KYPHOPLASTY N/A 12/17/2016   Procedure: KYPHOPLASTY;  Surgeon: Kennedy Bucker, MD;  Location: [facility];  Service: Orthopedics;  Laterality: N/A;  l4   LUMBAR LAMINECTOMY  05/09/2017   L2-L5   VISCERAL ANGIOGRAPHY N/A 08/15/2017   Procedure: VISCERAL ANGIOGRAPHY;  Surgeon: Annice Needy, MD;  Location: [facility];  Service: Cardiovascular;  Laterality: N/A;   VISCERAL ANGIOGRAPHY N/A 10/05/2017   Procedure: VISCERAL ANGIOGRAPHY;  Surgeon: Annice Needy, MD;  Location: [facility];  Service: Cardiovascular;   Laterality: N/A;   VISCERAL ARTERY INTERVENTION N/A 08/15/2017   Procedure: VISCERAL ARTERY INTERVENTION;  Surgeon: Annice Needy, MD;  Location: [facility];  Service: Cardiovascular;  Laterality: N/A;    Family History  Problem Relation Age of Onset   Heart disease Mother    Hypertension Father    Heart disease Brother 71  heart attack   Cancer Brother        bladder   Diabetes Sister    Alcohol abuse Sister    Kidney disease Sister    Diabetes Brother    Stroke Brother    Diabetes Maternal Grandmother    Heart disease Maternal Grandmother    Cancer Brother        lung    Allergies  Allergen Reactions   Ibandronic Acid Other (See Comments)    Achy all over. Flu like S/S   Other Other (See Comments)    Achy all over. Flu like S/S Dysphagia   Prednisone Rash   Actonel [Risedronate] Other (See Comments)    Dysphagia   Amoxicillin Hives   Pantoprazole Other (See Comments)    Little blistery bumps   Raloxifene Other (See Comments)    Mood swings   Versed [Midazolam] Other (See Comments)    Difficult waking up and memory loss   Doxycycline Rash       Latest Ref Rng & Units 03/10/2023   12:14 PM 05/05/2022    5:02 AM 05/04/2022   12:28 AM  CBC  WBC 4.0 - 10.5 K/uL 5.5  5.6  6.6   Hemoglobin 12.0 - 15.0 g/dL 62.1  30.8  65.7   Hematocrit 36.0 - 46.0 % 42.3  44.5  50.2   Platelets 150.0 - 400.0 K/uL 205.0  178  199       CMP     Component Value Date/Time   NA 137 04/19/2023 1355   NA 143 12/19/2015 1422   K 3.9 04/19/2023 1355   CL 100 04/19/2023 1355   CO2 30 04/19/2023 1355   GLUCOSE 145 (H) 04/19/2023 1355   BUN 18 04/19/2023 1355   BUN 15 12/19/2015 1422   CREATININE 0.71 04/19/2023 1355   CREATININE 0.62 01/26/2019 1542   CALCIUM 9.7 04/19/2023 1355   PROT 7.5 04/19/2023 1355   PROT 6.9 09/08/2021 1502   ALBUMIN 4.4 04/19/2023 1355   ALBUMIN 4.4 09/08/2021 1502   AST 40 (H) 04/19/2023 1355   ALT 21 04/19/2023 1355   ALKPHOS 55  04/19/2023 1355   BILITOT 0.5 04/19/2023 1355   BILITOT 0.2 09/08/2021 1502   GFR 73.26 04/19/2023 1355   GFRNONAA >60 05/05/2022 0502   GFRNONAA 80 01/26/2019 1542     No results found.     Assessment & Plan:   1. Celiac artery stenosis (HCC) This was evaluated about a month ago and is stable.  Will plan on checking annually  2. Essential hypertension Continue antihypertensive medications as already ordered, these medications have been reviewed and there are no changes at this time.  3. Spinal stenosis of lumbar region, unspecified whether neurogenic claudication present This may account for some of the lower extremity cramping and discomfort that she is having as well.  4. Leg pain, right Today I extensively reviewed the patient's studies with her and daughter present today.  Based on the noninvasive studies the patient does not have any significant arterial disease or obstruction.  She does have some mild disease.  Based upon her descriptions of pain and discomfort I suspect that this is possibly neuropathic.  We discussed the role of intervention lower extremities based upon the patient's age and the lack of any pressing symptoms such as disabling claudication, rest pain or ulceration my suggestion is that we continue to monitor this closely.  Based on that the patient is agreeable we will have her return in 6  months.   Current Outpatient Medications on File Prior to Visit  Medication Sig Dispense Refill   acetaminophen (TYLENOL) 500 MG tablet Take 500 mg by mouth every 6 (six) hours as needed for mild pain.     Alfalfa 500 MG TABS Take 500 mg by mouth daily.     ALPRAZolam (XANAX) 0.25 MG tablet Take 0.5 tablets (0.125 mg total) by mouth as needed for anxiety (for anxiety or elevated blood pressure). 20 tablet 0   amLODipine (NORVASC) 2.5 MG tablet Take 1 tablet (2.5 mg total) by mouth daily. As needed for systolic pressure > 150 90 tablet 0   Ascorbic Acid (VITAMIN C CR) 500 MG  CPCR Take 500 mg by mouth daily.     aspirin EC 81 MG tablet Take 1 tablet (81 mg total) by mouth daily. Swallow whole. 90 tablet 2   Calcium Carbonate (CALCIUM 600 PO) Take by mouth.     Cholecalciferol (VITAMIN D3) 3000 UNITS TABS Take 3,000 Units by mouth 2 (two) times daily.      clotrimazole-betamethasone (LOTRISONE) cream Apply topically 2 (two) times daily. 30 g 1   diclofenac sodium (VOLTAREN) 1 % GEL Apply topically as needed.     fluticasone (FLONASE) 50 MCG/ACT nasal spray Place 1 spray into both nostrils daily.     Ginkgo Biloba (GNP GINGKO BILOBA EXTRACT PO) Take 1 tablet by mouth daily.     Glucosamine Sulfate 500 MG CAPS Take 500 mg by mouth daily.     hydrALAZINE (APRESOLINE) 10 MG tablet Take 10 mg by mouth as needed.     isosorbide mononitrate (IMDUR) 30 MG 24 hr tablet Take 1 tablet (30 mg total) by mouth daily. 90 tablet 1   Lactobacillus (PROBIOTIC ACIDOPHILUS PO) Take 1 capsule by mouth daily.      loratadine (CLARITIN) 10 MG tablet Take 10 mg by mouth daily as needed for allergies.     Magnesium 400 MG CAPS Take 400 mg by mouth daily.     metoprolol succinate (TOPROL XL) 25 MG 24 hr tablet Take 0.5 tablets (12.5 mg total) by mouth daily. 45 tablet 3   Multiple Vitamin (MULTIVITAMIN) capsule Take 1 capsule by mouth daily.     mupirocin ointment (BACTROBAN) 2 % Apply to right leg wound daily - a small dab as needed once a day. 15 g 0   nitroGLYCERIN (NITROSTAT) 0.4 MG SL tablet DISSOLVE ONE TABLET UNDER THE TONGUE EVERY 5 MINUTES AS NEEDED FOR CHEST PAIN.  DO NOT EXCEED A TOTAL OF 3 DOSES IN 15 MINUTES 50 tablet 0   nystatin cream (MYCOSTATIN) Apply twice daily to corners of lips until redness resolves 15 g 2   Omega-3 Fatty Acids (OMEGA 3 PO) Take 520 mg by mouth daily.      Pramoxine-HC (HYDROCORTISONE ACE-PRAMOXINE) 2.5-1 % CREA Apply topically as needed.      rosuvastatin (CRESTOR) 10 MG tablet Take 1 tablet (10 mg total) by mouth every other day. 45 tablet 1   No  current facility-administered medications on file prior to visit.    There are no Patient Instructions on file for this visit. No follow-ups on file.   Georgiana Spinner, NP

## 2023-07-20 ENCOUNTER — Other Ambulatory Visit (INDEPENDENT_AMBULATORY_CARE_PROVIDER_SITE_OTHER): Payer: Self-pay | Admitting: Nurse Practitioner

## 2023-07-20 DIAGNOSIS — L600 Ingrowing nail: Secondary | ICD-10-CM

## 2023-08-09 DIAGNOSIS — I7300 Raynaud's syndrome without gangrene: Secondary | ICD-10-CM | POA: Diagnosis not present

## 2023-08-09 DIAGNOSIS — M150 Primary generalized (osteo)arthritis: Secondary | ICD-10-CM | POA: Diagnosis not present

## 2023-08-09 DIAGNOSIS — R768 Other specified abnormal immunological findings in serum: Secondary | ICD-10-CM | POA: Diagnosis not present

## 2023-08-29 ENCOUNTER — Other Ambulatory Visit: Payer: Self-pay

## 2023-08-29 MED ORDER — METOPROLOL SUCCINATE ER 25 MG PO TB24: 12.5000 mg | ORAL_TABLET | Freq: Every day | ORAL | 0 refills | Status: DC

## 2023-08-29 NOTE — Telephone Encounter (Signed)
Please contact patient for a follow up appointment with Dr. Azucena Cecil. There is a recall in the chart for Oct. 2024. Thanks, Jasmine December

## 2023-08-31 NOTE — Telephone Encounter (Signed)
LMOV

## 2023-09-06 DIAGNOSIS — L821 Other seborrheic keratosis: Secondary | ICD-10-CM | POA: Diagnosis not present

## 2023-09-06 DIAGNOSIS — D0439 Carcinoma in situ of skin of other parts of face: Secondary | ICD-10-CM | POA: Diagnosis not present

## 2023-09-06 DIAGNOSIS — L570 Actinic keratosis: Secondary | ICD-10-CM | POA: Diagnosis not present

## 2023-09-06 DIAGNOSIS — D485 Neoplasm of uncertain behavior of skin: Secondary | ICD-10-CM | POA: Diagnosis not present

## 2023-09-12 ENCOUNTER — Ambulatory Visit: Payer: HMO | Admitting: Podiatry

## 2023-09-14 ENCOUNTER — Ambulatory Visit: Payer: HMO | Admitting: Podiatry

## 2023-09-15 ENCOUNTER — Ambulatory Visit (INDEPENDENT_AMBULATORY_CARE_PROVIDER_SITE_OTHER): Payer: HMO | Admitting: Podiatry

## 2023-09-15 ENCOUNTER — Encounter: Payer: Self-pay | Admitting: Podiatry

## 2023-09-15 DIAGNOSIS — L600 Ingrowing nail: Secondary | ICD-10-CM | POA: Diagnosis not present

## 2023-09-15 NOTE — Progress Notes (Signed)
Subjective:  Patient ID: [name], female    DOB: August 13, 1927,  MRN: [id number]  Chief Complaint  Patient presents with   Nail Problem    "I've got ingrown toenails on both big toes."    89 y.o. female presents with the above complaint.  Patient presents with left hallux lateral border ingrown painful to touch has progressive gotten worse worse with ambulation worse with pressure she would like to discuss treatment options pain scale 7 out of 10 dull aching nature she has not seen anyone else prior to seeing me denies any other acute complaints   Review of Systems: Negative except as noted in the HPI. Denies N/V/F/Ch.  Past Medical History:  Diagnosis Date   Acute COVID-19 06/04/2021   Allergy    Arthritis    Atherosclerosis    Atherosclerosis of abdominal aorta (HCC)    Chronic kidney disease    stage 2   CKD (chronic kidney disease) stage 2, GFR 60-89 ml/min    Collagen vascular disease (HCC)    Compression fracture of thoracic spine, non-traumatic (HCC) 08/19/2015   Seen on plain films October 2016   Diverticulosis 2008   GERD (gastroesophageal reflux disease)    Heart murmur    Hyperlipidemia    Hypertensive urgency 05/04/2022   Lumbar spinal stenosis    Osteoporosis    Ovarian lump    RA (rheumatoid arthritis) (HCC)    Raynaud's disease    Rheumatoid arthritis (HCC)    Spinal stenosis     Current Outpatient Medications:    acetaminophen (TYLENOL) 500 MG tablet, Take 500 mg by mouth every 6 (six) hours as needed for mild pain., Disp: , Rfl:    Alfalfa 500 MG TABS, Take 500 mg by mouth daily., Disp: , Rfl:    ALPRAZolam (XANAX) 0.25 MG tablet, Take 0.5 tablets (0.125 mg total) by mouth as needed for anxiety (for anxiety or elevated blood pressure)., Disp: 20 tablet, Rfl: 0   amLODipine (NORVASC) 2.5 MG tablet, Take 1 tablet (2.5 mg total) by mouth daily. As needed for systolic pressure > 150, Disp: 90 tablet, Rfl: 0   Ascorbic Acid (VITAMIN C CR) 500 MG  CPCR, Take 500 mg by mouth daily., Disp: , Rfl:    aspirin EC 81 MG tablet, Take 1 tablet (81 mg total) by mouth daily. Swallow whole., Disp: 90 tablet, Rfl: 2   Calcium Carbonate (CALCIUM 600 PO), Take by mouth., Disp: , Rfl:    Cholecalciferol (VITAMIN D3) 3000 UNITS TABS, Take 3,000 Units by mouth 2 (two) times daily. , Disp: , Rfl:    clotrimazole-betamethasone (LOTRISONE) cream, Apply topically 2 (two) times daily., Disp: 30 g, Rfl: 1   diclofenac sodium (VOLTAREN) 1 % GEL, Apply topically as needed., Disp: , Rfl:    fluticasone (FLONASE) 50 MCG/ACT nasal spray, Place 1 spray into both nostrils daily., Disp: , Rfl:    Ginkgo Biloba (GNP GINGKO BILOBA EXTRACT PO), Take 1 tablet by mouth daily., Disp: , Rfl:    Glucosamine Sulfate 500 MG CAPS, Take 500 mg by mouth daily., Disp: , Rfl:    hydrALAZINE (APRESOLINE) 10 MG tablet, Take 10 mg by mouth as needed., Disp: , Rfl:    isosorbide mononitrate (IMDUR) 30 MG 24 hr tablet, Take 1 tablet (30 mg total) by mouth daily., Disp: 90 tablet, Rfl: 1   Lactobacillus (PROBIOTIC ACIDOPHILUS PO), Take 1 capsule by mouth daily. , Disp: , Rfl:    loratadine (CLARITIN) 10 MG tablet, Take 10 mg  by mouth daily as needed for allergies., Disp: , Rfl:    Magnesium 400 MG CAPS, Take 400 mg by mouth daily., Disp: , Rfl:    metoprolol succinate (TOPROL XL) 25 MG 24 hr tablet, Take 0.5 tablets (12.5 mg total) by mouth daily., Disp: 15 tablet, Rfl: 0   Multiple Vitamin (MULTIVITAMIN) capsule, Take 1 capsule by mouth daily., Disp: , Rfl:    mupirocin ointment (BACTROBAN) 2 %, Apply to right leg wound daily - a small dab as needed once a day., Disp: 15 g, Rfl: 0   nitroGLYCERIN (NITROSTAT) 0.4 MG SL tablet, DISSOLVE ONE TABLET UNDER THE TONGUE EVERY 5 MINUTES AS NEEDED FOR CHEST PAIN.  DO NOT EXCEED A TOTAL OF 3 DOSES IN 15 MINUTES, Disp: 50 tablet, Rfl: 0   nystatin cream (MYCOSTATIN), Apply twice daily to corners of lips until redness resolves, Disp: 15 g, Rfl: 2    Omega-3 Fatty Acids (OMEGA 3 PO), Take 520 mg by mouth daily. , Disp: , Rfl:    Pramoxine-HC (HYDROCORTISONE ACE-PRAMOXINE) 2.5-1 % CREA, Apply topically as needed. , Disp: , Rfl:    rosuvastatin (CRESTOR) 10 MG tablet, Take 1 tablet (10 mg total) by mouth every other day., Disp: 45 tablet, Rfl: 1  Social History   Tobacco Use  Smoking Status Never  Smokeless Tobacco Never  Tobacco Comments   smoking cessation materials not required    Allergies  Allergen Reactions   Ibandronic Acid Other (See Comments)    Achy all over. Flu like S/S   Other Other (See Comments)    Achy all over. Flu like S/S Dysphagia   Prednisone Rash   Actonel [Risedronate] Other (See Comments)    Dysphagia   Amoxicillin Hives   Pantoprazole Other (See Comments)    Little blistery bumps   Raloxifene Other (See Comments)    Mood swings   Versed [Midazolam] Other (See Comments)    Difficult waking up and memory loss   Doxycycline Rash   Objective:  There were no vitals filed for this visit. There is no height or weight on file to calculate BMI. Constitutional Well developed. Well nourished.  Vascular Dorsalis pedis pulses palpable bilaterally. Posterior tibial pulses palpable bilaterally. Capillary refill normal to all digits.  No cyanosis or clubbing noted. Pedal hair growth normal.  Neurologic Normal speech. Oriented to person, place, and time. Epicritic sensation to light touch grossly present bilaterally.  Dermatologic Painful ingrowing nail at lateral nail borders of the hallux nail left. No other open wounds. No skin lesions.  Orthopedic: Normal joint ROM without pain or crepitus bilaterally. No visible deformities. No bony tenderness.   Radiographs: None Assessment:   1. Ingrown left big toenail    Plan:  Patient was evaluated and treated and all questions answered.  Ingrown Nail, left -Patient elects to proceed with minor surgery to remove ingrown toenail removal today. Consent  reviewed and signed by patient. -Ingrown nail excised. See procedure note. -Educated on post-procedure care including soaking. Written instructions provided and reviewed. -Patient to follow up in 2 weeks for nail check.  Procedure: Excision of Ingrown Toenail Location: Left 1st toe lateral nail borders. Anesthesia: Lidocaine 1% plain; 1.5 mL and Marcaine 0.5% plain; 1.5 mL, digital block. Skin Prep: Betadine. Dressing: Silvadene; telfa; dry, sterile, compression dressing. Technique: Following skin prep, the toe was exsanguinated and a tourniquet was secured at the base of the toe. The affected nail border was freed, split with a nail splitter, and excised. Chemical matrixectomy was  then performed with phenol and irrigated out with alcohol. The tourniquet was then removed and sterile dressing applied. Disposition: Patient tolerated procedure well. Patient to return in 2 weeks for follow-up.   No follow-ups on file.

## 2023-09-26 ENCOUNTER — Emergency Department: Payer: HMO

## 2023-09-26 ENCOUNTER — Other Ambulatory Visit: Payer: Self-pay

## 2023-09-26 ENCOUNTER — Emergency Department
Admission: EM | Admit: 2023-09-26 | Discharge: 2023-09-26 | Disposition: A | Discharge status: Home / Self Care | Payer: HMO | Attending: Emergency Medicine | Admitting: Emergency Medicine

## 2023-09-26 DIAGNOSIS — R42 Dizziness and giddiness: Secondary | ICD-10-CM | POA: Insufficient documentation

## 2023-09-26 DIAGNOSIS — I6782 Cerebral ischemia: Secondary | ICD-10-CM | POA: Diagnosis not present

## 2023-09-26 DIAGNOSIS — N182 Chronic kidney disease, stage 2 (mild): Secondary | ICD-10-CM | POA: Insufficient documentation

## 2023-09-26 DIAGNOSIS — I129 Hypertensive chronic kidney disease with stage 1 through stage 4 chronic kidney disease, or unspecified chronic kidney disease: Secondary | ICD-10-CM | POA: Diagnosis not present

## 2023-09-26 DIAGNOSIS — R4781 Slurred speech: Secondary | ICD-10-CM | POA: Diagnosis not present

## 2023-09-26 DIAGNOSIS — R0602 Shortness of breath: Secondary | ICD-10-CM | POA: Diagnosis not present

## 2023-09-26 DIAGNOSIS — I2510 Atherosclerotic heart disease of native coronary artery without angina pectoris: Secondary | ICD-10-CM | POA: Diagnosis not present

## 2023-09-26 DIAGNOSIS — R55 Syncope and collapse: Secondary | ICD-10-CM | POA: Diagnosis not present

## 2023-09-26 DIAGNOSIS — Z1152 Encounter for screening for COVID-19: Secondary | ICD-10-CM | POA: Insufficient documentation

## 2023-09-26 DIAGNOSIS — R059 Cough, unspecified: Secondary | ICD-10-CM | POA: Diagnosis not present

## 2023-09-26 LAB — CBC
HCT: 43.6 % (ref 36.0–46.0)
Hemoglobin: 14.5 g/dL (ref 12.0–15.0)
MCH: 30.1 pg (ref 26.0–34.0)
MCHC: 33.3 g/dL (ref 30.0–36.0)
MCV: 90.5 fL (ref 80.0–100.0)
Platelets: 201 10*3/uL (ref 150–400)
RBC: 4.82 MIL/uL (ref 3.87–5.11)
RDW: 12.8 % (ref 11.5–15.5)
WBC: 4.2 10*3/uL (ref 4.0–10.5)
nRBC: 0 % (ref 0.0–0.2)

## 2023-09-26 LAB — URINALYSIS, ROUTINE W REFLEX MICROSCOPIC
Bilirubin Urine: NEGATIVE
Glucose, UA: NEGATIVE mg/dL
Hgb urine dipstick: NEGATIVE
Ketones, ur: NEGATIVE mg/dL
Leukocytes,Ua: NEGATIVE
Nitrite: NEGATIVE
Protein, ur: NEGATIVE mg/dL
Specific Gravity, Urine: 1.008 (ref 1.005–1.030)
pH: 7 (ref 5.0–8.0)

## 2023-09-26 LAB — BASIC METABOLIC PANEL
Anion gap: 8 (ref 5–15)
BUN: 15 mg/dL (ref 8–23)
CO2: 28 mmol/L (ref 22–32)
Calcium: 9.1 mg/dL (ref 8.9–10.3)
Chloride: 104 mmol/L (ref 98–111)
Creatinine, Ser: 0.46 mg/dL (ref 0.44–1.00)
GFR, Estimated: >60 mL/min (ref >60)
Glucose, Bld: 104 mg/dL — ABNORMAL HIGH (ref 70–99)
Potassium: 4 mmol/L (ref 3.5–5.1)
Sodium: 140 mmol/L (ref 135–145)

## 2023-09-26 LAB — CBG MONITORING, ED: Glucose-Capillary: 94 mg/dL (ref 70–99)

## 2023-09-26 LAB — RESP PANEL BY RT-PCR (RSV, FLU A&B, COVID)  RVPGX2
Influenza A by PCR: NEGATIVE
Influenza B by PCR: NEGATIVE
Resp Syncytial Virus by PCR: NEGATIVE
SARS Coronavirus 2 by RT PCR: NEGATIVE

## 2023-09-26 MED ORDER — GADOBUTROL 1 MMOL/ML IV SOLN
4.0000 mL | Freq: Once | INTRAVENOUS | Status: AC | PRN
  Administered 2023-09-26: 4 mL via INTRAVENOUS

## 2023-09-26 NOTE — ED Notes (Signed)
EDP at [facility] 

## 2023-09-26 NOTE — ED Notes (Signed)
Denies dizziness or HA at this time lying in bed

## 2023-09-26 NOTE — ED Notes (Signed)
Pt to MRI, no changes, alert, NAD, calm, interactive.

## 2023-09-26 NOTE — ED Notes (Signed)
Pt alert, NAD, calm, interactive, resps e/u, speaking in clear complete sentences. Family at [facility]. EDP at [facility]. Pt endorses intermittent, slumping, slurred speech, dizziness and HA yesterday and last night.

## 2023-09-26 NOTE — ED Provider Notes (Signed)
[facility] Provider Note    Event Date/Time   First MD Initiated Contact with Patient 09/26/23 [phone]     (approximate)   History   Dizziness   HPI [name] is a 89 y.o. female with history of HTN, HLD, CAD, CKD stage II presenting today for dizziness.  Patient states over the past 24 hours she has had generalized bodyaches.  She reportedly had 1 episode at home with slurred speech that was brief and then resolved.  They noted her blood pressure being high and she took amlodipine with improvement in symptoms.  Reportedly then this morning had an episode of dizziness which she described as room spinning when she was up walking around.  Denied feeling lightheaded or like she was going to pass out.  Denies any unilateral weakness or numbness.  No speech changes today.  Has chronic runny nose and dry cough without any obvious acute change.  Has had polyuria but no dysuria.  Otherwise denies chest pain, shortness of breath, abdominal pain, leg pain, leg swelling.  Reviewed recent chart notes.     Physical Exam   Triage Vital Signs: ED Triage Vitals  Encounter Vitals Group     BP 09/26/23 0926 (!) 169/84     Systolic BP Percentile --      Diastolic BP Percentile --      Pulse Rate 09/26/23 0926 (!) 56     Resp 09/26/23 0926 17     Temp --      Temp src --      SpO2 09/26/23 0926 97 %     Weight 09/26/23 0927 97 lb 7.1 oz (44.2 kg)     Height 09/26/23 0927 5' (1.524 m)     Head Circumference --      Peak Flow --      Pain Score 09/26/23 0927 0     Pain Loc --      Pain Education --      Exclude from Growth Chart --     Most recent vital signs: Vitals:   09/26/23 1305 09/26/23 1410  BP:  (!) 199/84  Pulse: (!) 51 61  Resp:  18  Temp:  97.9 F (36.6 C)  SpO2: 100% 100%   Physical Exam: I have reviewed the vital signs and nursing notes. General: Awake, alert, no acute distress.  Nontoxic appearing. Head:  Atraumatic, normocephalic.    ENT:  EOM intact, PERRL. Oral mucosa is pink and moist with no lesions. Neck: Neck is supple with full range of motion, No meningeal signs. Cardiovascular:  RRR, No murmurs. Peripheral pulses palpable and equal bilaterally. Respiratory:  Symmetrical chest wall expansion.  No rhonchi, rales, or wheezes.  Good air movement throughout.  No use of accessory muscles.   Musculoskeletal:  No cyanosis or edema. Moving extremities with full ROM Abdomen:  Soft, nontender, nondistended. Neuro:  GCS 15, moving all four extremities, interacting appropriately. Alert and oriented with cogent speech; 5/5 motor strength all 4 extremities with intact peripheral nerve distributions; gross sensory intact to challenge in all 4 extremities and peripheral nerve distributions; no clonus; cerebellar examination normal including Romberg, finger-to-nose, heel-to-shin, and without trunkal ataxia, dysdiadochokinesis or dysmetria; cranial nerves 2-12 intact to gross challenge bilaterally. Psych:  Calm, appropriate.   Skin:  Warm, dry, no rash.    ED Results / Procedures / Treatments   Labs (all labs ordered are listed, but only abnormal results are displayed) Labs Reviewed  BASIC METABOLIC PANEL -  Abnormal; Notable for the following components:      Result Value   Glucose, Bld 104 (*)    All other components within normal limits  URINALYSIS, ROUTINE W REFLEX MICROSCOPIC - Abnormal; Notable for the following components:   Color, Urine STRAW (*)    APPearance CLEAR (*)    All other components within normal limits  RESP PANEL BY RT-PCR (RSV, FLU A&B, COVID)  [id number]  CBC  CBG MONITORING, ED     EKG My EKG interpretation: Rate of 59, sinus rhythm with occasional PAC.  Normal axis, normal intervals.  No acute ST elevation or depression   RADIOLOGY Independently interpreted CT and MRI with no acute pathology.  Independently interpreted x-ray with no acute pathology   PROCEDURES:  Critical Care performed:  No  Procedures   MEDICATIONS ORDERED IN ED: Medications  gadobutrol (GADAVIST) 1 MMOL/ML injection 4 mL (4 mLs Intravenous Contrast Given 09/26/23 1232)     IMPRESSION / MDM / ASSESSMENT AND PLAN / ED COURSE  I reviewed the triage vital signs and the nursing notes.                              Differential diagnosis includes, but is not limited to, peripheral vertigo, orthostatic hypotension, medication induced hypotension, CVA, viral URI, UTI  Patient's presentation is most consistent with acute complicated illness / injury requiring diagnostic workup.  Patient is a 89 year old female presenting today for 1 episode of slurred speech yesterday and 1 episode of dizziness today.  On arrival to the ED she is asymptomatic.  Vital signs are stable.  Neurological exam completely unremarkable at this time.  Given concerning symptoms over the past 24 hours concern for TIA versus CVA versus infection or medication induced hypotension.  CBC, BMP unremarkable.  UA with no evidence of UTI.  Respiratory panel negative.  Chest x-ray shows no acute pathology.  Initial CT head shows no acute concerns.  Follow-up with MRI was performed which also was negative for CVA.  Patient was reassessed and still asymptomatic.  Ambulate around the ED without any ongoing symptoms.  No concern for CVA or central cause of vertigo.  I suspect likely medication induced hypotension may more likely be a result of her symptoms.  Given that she is asymptomatic at this time, patient would like to be discharged and follow-up with her PCP.  I do think she is safe to go home at this time given her extensive workup here which was reassuring.  She was given strict return precautions and told to follow-up with her PCP.  The patient is on the cardiac monitor to evaluate for evidence of arrhythmia and/or significant heart rate changes. Clinical Course as of 09/26/23 1427  Mon Sep 26, 2023  1038 CT HEAD WO CONTRAST No acute findings on  CT [DW]  1121 Will get MRI to rule out evidence of TIA [DW]  1256 DG Chest 2 View No acute pathology [DW]  1257 Urinalysis, Routine w reflex microscopic -Urine, Clean Catch(!) No UTI [DW]  1314 MR Brain W and Wo Contrast No evidence of CVA or other central pathology for patient's slurred speech or dizziness.  Suspect more likely related to blood pressure yesterday. [DW]    Clinical Course User Index [DW] Janith Lima, MD     FINAL CLINICAL IMPRESSION(S) / ED DIAGNOSES   Final diagnoses:  Dizziness     Rx / DC Orders   ED Discharge  Orders     None        Note:  This document was prepared using Dragon voice recognition software and may include unintentional dictation errors.   Janith Lima, MD 09/26/23 [phone]

## 2023-09-26 NOTE — Discharge Instructions (Signed)
Please follow-up with your primary care provider later this week for reassessment.  I would like you to take your blood pressure once in the morning and once in the evening at the same time each day.  Please track these numbers and bring them to your PCP for potential adjustment of your blood pressure medications.  Please return to the ED for any worsening symptoms.

## 2023-09-26 NOTE — ED Triage Notes (Signed)
Pt here with dizziness since yesterday. Pt states she was leaning over and had hypertension yesterday as well as some slurred speech. Pt states this morning she was dizzy as well and is still having intermittent slurred speech. Pt states she feels weak and a bit dizzy. Pt not able to give a LKW. Pt talking in complete sentences, states her left side was weak yesterday but today it is all over and she is swimmy headed. Pt is normally active and independent.

## 2023-09-26 NOTE — ED Notes (Signed)
Pt alert, NAD, calm, interactive. Up to [facility] with family. Ambulatory down h/w, steady gait. Denies HA, pain, sob or dizziness. Mentions general weakness, primarily in BLE. Relates weakness to "haven't eaten". Plans to go to get something to eat after d/c. Family at [facility].

## 2023-09-26 NOTE — ED Notes (Signed)
Taken to CT, not in room, not visualized by this RN

## 2023-09-26 NOTE — ED Notes (Signed)
Back from radiology, alert, NAD, calm, interactive, no changes.

## 2023-09-28 ENCOUNTER — Ambulatory Visit: Payer: HMO | Admitting: Internal Medicine

## 2023-09-28 ENCOUNTER — Encounter: Payer: Self-pay | Admitting: Internal Medicine

## 2023-09-28 VITALS — BP 122/70 | HR 58 | Temp 97.6°F | Ht 60.0 in | Wt 98.8 lb

## 2023-09-28 DIAGNOSIS — G459 Transient cerebral ischemic attack, unspecified: Secondary | ICD-10-CM

## 2023-09-28 DIAGNOSIS — Z8673 Personal history of transient ischemic attack (TIA), and cerebral infarction without residual deficits: Secondary | ICD-10-CM | POA: Diagnosis not present

## 2023-09-28 DIAGNOSIS — R4589 Other symptoms and signs involving emotional state: Secondary | ICD-10-CM

## 2023-09-28 NOTE — Progress Notes (Addendum)
Subjective:  Patient ID: [name], female    DOB: 20-Jun-1927  Age: 89 y.o. MRN: [id number]  CC: The primary encounter diagnosis was TIA (transient ischemic attack). A diagnosis of Anxiety about health was also pertinent to this visit.   HPI [name] presents for  Chief Complaint  Patient presents with   Hospitalization Follow-up   Accompanied by her daughters, [name] and [name]  [name] is  a delightful 89 yr old female with PAD, CAD, hypertension  and GAD who was   admitted to [facility] with one day history of  new onset headache and   body aches, followed by vertigo and transient slurred speech accompanied by hypertension that responded to amlodipine.  Was seen on  Sunday Nov 17th , neuro exam was normal but CT and MRI brain were done ,  with no acute changes. 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 Labs including UA were done and normal/stable.   Daughters are concerned about intermittent right sided facial droop . However, each daughter seems to have noticed the dro op on different sides.   Patient feels that her swallowing has become more difficult but she denies choking , she eats very slowly   We has a long discussion about her difficulty accepting the fact that because she is aging she is no longer able to be as productive as she used ton once be. She admits that she has always tried to control everything and feels  increased stress because her memory is slipping and it takes her longer to do certain tasks. .      Outpatient Medications Prior to Visit  Medication Sig Dispense Refill   acetaminophen (TYLENOL) 500 MG tablet Take 500 mg by mouth every 6 (six) hours as needed for mild pain.     Alfalfa 500 MG TABS Take 500 mg by mouth daily.     ALPRAZolam  (XANAX) 0.25 MG tablet Take 0.5 tablets (0.125 mg total) by mouth as needed for anxiety (for anxiety or elevated blood pressure). 20 tablet 0   amLODipine (NORVASC) 2.5 MG tablet Take 1 tablet (2.5 mg total) by mouth daily. As needed for systolic pressure > 150 90 tablet 0   Ascorbic Acid (VITAMIN C CR) 500 MG CPCR Take 500 mg by mouth daily.     aspirin EC 81 MG tablet Take 1 tablet (81 mg total) by mouth daily. Swallow whole. 90 tablet 2   Calcium Carbonate (CALCIUM 600 PO) Take by mouth.     Cholecalciferol (VITAMIN D3) 3000 UNITS TABS Take 3,000 Units by mouth 2 (two) times daily.      clotrimazole-betamethasone (LOTRISONE) cream Apply topically 2 (two) times daily. 30 g 1   diclofenac sodium (VOLTAREN) 1 % GEL Apply topically as needed.     fluticasone (FLONASE) 50 MCG/ACT nasal spray Place 1 spray into both nostrils daily.     Ginkgo Biloba (GNP GINGKO BILOBA EXTRACT PO) Take 1 tablet by mouth daily.     Glucosamine Sulfate 500 MG CAPS Take 500 mg by mouth daily.     hydrALAZINE (APRESOLINE) 10 MG tablet Take 10 mg by mouth as needed.     isosorbide mononitrate (IMDUR) 30 MG 24 hr tablet Take 1 tablet (30 mg total) by mouth daily. 90 tablet 1  Lactobacillus (PROBIOTIC ACIDOPHILUS PO) Take 1 capsule by mouth daily.      loratadine (CLARITIN) 10 MG tablet Take 10 mg by mouth daily as needed for allergies.     Magnesium 400 MG CAPS Take 400 mg by mouth daily.     Multiple Vitamin (MULTIVITAMIN) capsule Take 1 capsule by mouth daily.     mupirocin ointment (BACTROBAN) 2 % Apply to right leg wound daily - a small dab as needed once a day. 15 g 0   nitroGLYCERIN (NITROSTAT) 0.4 MG SL tablet DISSOLVE ONE TABLET UNDER THE TONGUE EVERY 5 MINUTES AS NEEDED FOR CHEST PAIN.  DO NOT EXCEED A TOTAL OF 3 DOSES IN 15 MINUTES 50 tablet 0   nystatin cream (MYCOSTATIN) Apply twice daily to corners of lips until redness resolves 15 g 2   Omega-3 Fatty Acids (OMEGA 3 PO) Take 520 mg by mouth daily.       Pramoxine-HC (HYDROCORTISONE ACE-PRAMOXINE) 2.5-1 % CREA Apply topically as needed.      rosuvastatin (CRESTOR) 10 MG tablet Take 1 tablet (10 mg total) by mouth every other day. 45 tablet 1   metoprolol succinate (TOPROL XL) 25 MG 24 hr tablet Take 0.5 tablets (12.5 mg total) by mouth daily. 15 tablet 0   No facility-administered medications prior to visit.    Review of Systems;  Patient denies headache, fevers, malaise, unintentional weight loss, skin rash, eye pain, sinus congestion and sinus pain, sore throat, dysphagia,  hemoptysis , cough, dyspnea, wheezing, chest pain, palpitations, orthopnea, edema, abdominal pain, nausea, melena, diarrhea, constipation, flank pain, dysuria, hematuria, urinary  Frequency, nocturia, numbness, tingling, seizures,  Focal weakness, Loss of consciousness,  Tremor, insomnia, depression, anxiety, and suicidal ideation.      Objective:  BP 122/70   Pulse (!) 58   Temp 97.6 F (36.4 C) (Oral)   Ht 5' (1.524 m)   Wt 98 lb 12.8 oz (44.8 kg)   SpO2 99%   BMI 19.30 kg/m   BP Readings from Last 3 Encounters:  09/28/23 122/70  09/26/23 (!) 199/84  07/13/23 (!) 188/82    Wt Readings from Last 3 Encounters:  09/28/23 98 lb 12.8 oz (44.8 kg)  09/26/23 97 lb 7.1 oz (44.2 kg)  07/13/23 97 lb 6.4 oz (44.2 kg)    Physical Exam Vitals reviewed.  Constitutional:      General: She is not in acute distress.    Appearance: Normal appearance. She is normal weight. She is not ill-appearing, toxic-appearing or diaphoretic.  HENT:     Head: Normocephalic.  Eyes:     General: No scleral icterus.       Right eye: No discharge.        Left eye: No discharge.     Conjunctiva/sclera: Conjunctivae normal.  Cardiovascular:     Rate and Rhythm: Normal rate and regular rhythm.     Heart sounds: Normal heart sounds.  Pulmonary:     Effort: Pulmonary effort is normal. No respiratory distress.     Breath sounds: Normal breath sounds.  Musculoskeletal:         General: Normal range of motion.  Skin:    General: Skin is warm and dry.  Neurological:     General: No focal deficit present.     Mental Status: She is alert and oriented to person, place, and time. Mental status is at baseline.     Cranial Nerves: No cranial nerve deficit.     Sensory: No sensory deficit.  Motor: No weakness.     Coordination: Coordination normal.     Gait: Gait normal.     Deep Tendon Reflexes: Reflexes normal.  Psychiatric:        Mood and Affect: Mood normal.        Behavior: Behavior normal.        Thought Content: Thought content normal.        Judgment: Judgment normal.    Lab Results  Component Value Date   HGBA1C 7.0 (H) 11/16/2021   HGBA1C 6.1 (H) 05/14/2019   HGBA1C 6.2 (H) 01/01/2019    Lab Results  Component Value Date   CREATININE 0.46 09/26/2023   CREATININE 0.71 04/19/2023   CREATININE 0.62 03/10/2023    Lab Results  Component Value Date   WBC 4.2 09/26/2023   HGB 14.5 09/26/2023   HCT 43.6 09/26/2023   PLT 201 09/26/2023   GLUCOSE 104 (H) 09/26/2023   CHOL 193 03/10/2023   TRIG 119.0 03/10/2023   HDL 73.40 03/10/2023   LDLDIRECT 96.0 03/10/2023   LDLCALC 96 03/10/2023   ALT 21 04/19/2023   AST 40 (H) 04/19/2023   NA 140 09/26/2023   K 4.0 09/26/2023   CL 104 09/26/2023   CREATININE 0.46 09/26/2023   BUN 15 09/26/2023   CO2 28 09/26/2023   TSH 2.13 03/10/2023   HGBA1C 7.0 (H) 11/16/2021   MICROALBUR <0.7 11/16/2021    MR Brain W and Wo Contrast  Result Date: 09/26/2023 CLINICAL DATA:  Episode of slurred speech yesterday which has resolved, episode of dizziness room spinning sensation today which has resolved. Evaluating for acute for subacute CVA versus TIA. EXAM: MRI HEAD WITHOUT AND WITH CONTRAST TECHNIQUE: Multiplanar, multiecho pulse sequences of the brain and surrounding structures were obtained without and with intravenous contrast. CONTRAST:  4mL GADAVIST GADOBUTROL 1 MMOL/ML IV SOLN COMPARISON:  MRI brain  12/03/2021. FINDINGS: Brain: No acute infarct or hemorrhage. Stable background of moderate chronic small-vessel disease. No hydrocephalus or extra-axial collection. No foci of abnormal susceptibility. No mass or abnormal enhancement. 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 Otherwise normal flow voids and vessel enhancement. Skull and upper cervical spine: Normal marrow signal and enhancement. Sinuses/Orbits: Negative. Other: None. IMPRESSION: 1. No acute intracranial process. 2. Stable background of moderate chronic small-vessel disease. Electronically Signed   By: Orvan Falconer M.D.   On: 09/26/2023 13:11   DG Chest 2 View  Result Date: 09/26/2023 CLINICAL DATA:  Shortness of breath.  Nonproductive cough. EXAM: CHEST - 2 VIEW COMPARISON:  05/04/2022. FINDINGS: Bilateral lung fields are clear. Bilateral costophrenic angles are clear. Normal cardio-mediastinal silhouette. No acute osseous abnormalities. Multiple old healed right sided rib fractures noted. Mildly reduced height of T11 vertebral body, similar to the prior CT scan from 03/14/2023. The soft tissues are within normal limits. IMPRESSION: *No active cardiopulmonary disease. Electronically Signed   By: Jules Schick M.D.   On: 09/26/2023 12:51   CT HEAD WO CONTRAST  Result Date: 09/26/2023 CLINICAL DATA:  Syncope/presyncope.  Stroke suspected. EXAM: CT HEAD WITHOUT CONTRAST TECHNIQUE: Contiguous axial images were obtained from the base of the skull through the vertex without intravenous contrast. RADIATION DOSE REDUCTION: This exam was performed according to the departmental dose-optimization program which includes automated exposure control, adjustment of the mA and/or kV according to patient size and/or use of iterative reconstruction technique. COMPARISON:  03/21/2023.  05/04/2022. FINDINGS: Brain: No focal abnormality seen affecting the brainstem or cerebellum. Old lacunar infarction of the right  basal  ganglia/internal capsule was not visible in 2023 but is not acute. Moderate chronic small-vessel ischemic changes throughout the cerebral hemispheric white matter, similar to the prior studies. No cortical or large vessel territory stroke. No mass lesion, hemorrhage, hydrocephalus or extra-axial collection. Vascular: There is atherosclerotic calcification of the major vessels at the base of the brain. Skull: Negative Sinuses/Orbits: Clear/normal Other: None IMPRESSION: No acute CT finding. 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 Moderate chronic small-vessel ischemic changes of the cerebral hemispheric white matter, similar to the prior studies. Electronically Signed   By: Paulina Fusi M.D.   On: 09/26/2023 10:35    Assessment & Plan:  .TIA (transient ischemic attack) Assessment & Plan: Reviewed ER visit including all imaging studies .  Symptos may have been a TIA vs a viral infection causing transient neurologic changes . Options of more aggressive treatment and evaluation by neurology offered,  with the risks and benefits of more aggressive therapy discussed.  No changes to regiemn today.  Referral to neurology deferred for now    Anxiety about health Assessment & Plan: Faith based counselling given today,  which comprised > 50% of the time spent with patient and family       I provided 41 minutes of face-to-face time during this encounter reviewing patient's last visit with me, patient's  most recent  ER visit, previous  labs and imaging studies, counseling on currently addressed issues,  and post visit ordering to diagnostics and therapeutics .   Follow-up: Return in about 3 months (around 12/29/2023).   Sherlene Shams, MD

## 2023-09-28 NOTE — Patient Instructions (Signed)
Please resume EVERY OTHER DAY ASPIRIN TO HELP PREVENT TIAs and Strokes    If you would like to see dr Sherryll Burger the neurologist at [facility] clinic , let me know and I will make the referral  If the bump above your left ear becomes painful and red or becomes SEVERAL BUMPS ., let me know

## 2023-09-29 ENCOUNTER — Telehealth: Payer: Self-pay | Admitting: Cardiology

## 2023-09-29 DIAGNOSIS — G459 Transient cerebral ischemic attack, unspecified: Secondary | ICD-10-CM | POA: Insufficient documentation

## 2023-09-29 MED ORDER — METOPROLOL SUCCINATE ER 25 MG PO TB24: 12.5000 mg | ORAL_TABLET | Freq: Every day | ORAL | 0 refills | Status: DC

## 2023-09-29 NOTE — Assessment & Plan Note (Signed)
Faith based counselling given today,  which comprised > 50% of the time spent with patient and family

## 2023-09-29 NOTE — Telephone Encounter (Signed)
*  STAT* If patient is at the pharmacy, call can be transferred to refill team.   1. Which medications need to be refilled? (please list name of each medication and dose if known) metoprolol succinate (TOPROL XL) 25 MG 24 hr tablet    2. Would you like to learn more about the convenience, safety, & potential cost savings by using the [facility]? No     3. Are you open to using the [facility] (Type [facility]. No ).   4. Which pharmacy/location (including street and city if local pharmacy) is medication to be sent to?                                                                                                                                                       [facility] [address], [place] - [address]     5. Do they need a 30 day or 90 day supply? 90

## 2023-09-29 NOTE — Assessment & Plan Note (Signed)
Reviewed ER visit including all imaging studies .  Symptos may have been a TIA vs a viral infection causing transient neurologic changes . Options of more aggressive treatment and evaluation by neurology offered,  with the risks and benefits of more aggressive therapy discussed.  No changes to regiemn today.  Referral to neurology deferred for now

## 2023-09-30 ENCOUNTER — Ambulatory Visit
Admission: RE | Admit: 2023-09-30 | Discharge: 2023-09-30 | Disposition: A | Discharge status: Home / Self Care | Payer: HMO | Source: Ambulatory Visit | Attending: Radiation Oncology | Admitting: Radiation Oncology

## 2023-09-30 DIAGNOSIS — C3411 Malignant neoplasm of upper lobe, right bronchus or lung: Secondary | ICD-10-CM | POA: Diagnosis not present

## 2023-09-30 DIAGNOSIS — C3490 Malignant neoplasm of unspecified part of unspecified bronchus or lung: Secondary | ICD-10-CM | POA: Diagnosis not present

## 2023-09-30 DIAGNOSIS — D3502 Benign neoplasm of left adrenal gland: Secondary | ICD-10-CM | POA: Diagnosis not present

## 2023-09-30 DIAGNOSIS — I700 Atherosclerosis of aorta: Secondary | ICD-10-CM | POA: Diagnosis not present

## 2023-09-30 MED ORDER — IOHEXOL 300 MG/ML  SOLN
60.0000 mL | Freq: Once | INTRAMUSCULAR | Status: AC | PRN
  Administered 2023-09-30: 60 mL via INTRAVENOUS

## 2023-10-12 ENCOUNTER — Encounter: Payer: Self-pay | Admitting: Cardiology

## 2023-10-12 ENCOUNTER — Ambulatory Visit: Payer: HMO | Attending: Cardiology | Admitting: Cardiology

## 2023-10-12 VITALS — BP 136/62 | HR 58 | Ht 60.0 in | Wt 99.0 lb

## 2023-10-12 DIAGNOSIS — I493 Ventricular premature depolarization: Secondary | ICD-10-CM

## 2023-10-12 DIAGNOSIS — I10 Essential (primary) hypertension: Secondary | ICD-10-CM

## 2023-10-12 MED ORDER — AMLODIPINE BESYLATE 2.5 MG PO TABS: 2.5000 mg | ORAL_TABLET | Freq: Every day | ORAL | 3 refills | Status: DC

## 2023-10-12 NOTE — Progress Notes (Signed)
Cardiology Office Note:    Date:  10/12/2023   ID:  [name], DOB [birth year], MRN [id number]  PCP:  Sherlene Shams, MD  [facility] Cardiologist:  Debbe Odea, MD  [facility] Electrophysiologist:  None   Referring MD: Sherlene Shams, MD   Chief Complaint  Patient presents with   Follow-up    EKG result on 09/26/23 available in chart.  Patient denies new or acute cardiac problems/concerns today.      History of Present Illness:    [name] is a 89 y.o. female with a hx of hypertension, frequent PVCs, CKD, celiac artery stenosis s/p stenting who presents for follow-up.    Had an episode of elevated BP a month ago, BP at home was 180 systolic.  States feeling shaky, took amlodipine 2.5 mg daily without improvement in BP.  Presented to the ED with systolics in the 160s.  Was monitored and eventually discharged home.  Overall she states feeling fatigued and shaky, pulse rate ranges in the 50s, as low as 51.   Prior notes Cardiac monitor 06/2021 frequent PVCs 12.2% burden Echo 06/2021 EF 60 to 65%, impaired relaxation. Lexiscan Myoview 04/2020, no evidence for ischemia, low risk study.   Past Medical History:  Diagnosis Date   Acute COVID-19 06/04/2021   Allergy    Arthritis    Atherosclerosis    Atherosclerosis of abdominal aorta (HCC)    Chronic kidney disease    stage 2   CKD (chronic kidney disease) stage 2, GFR 60-89 ml/min    Collagen vascular disease (HCC)    Compression fracture of thoracic spine, non-traumatic (HCC) 08/19/2015   Seen on plain films October 2016   Diverticulosis 2008   GERD (gastroesophageal reflux disease)    Heart murmur    Hyperlipidemia    Hypertensive urgency 05/04/2022   Lumbar spinal stenosis    Osteoporosis    Ovarian lump    RA (rheumatoid arthritis) (HCC)    Raynaud's disease    Rheumatoid arthritis (HCC)    Spinal stenosis     Past Surgical History:  Procedure Laterality Date   BREAST SURGERY  Left 1965   Benign biopsy   CHOLECYSTECTOMY  2005   Egan   COLONOSCOPY  2008   Dr. Ricki Rodriguez   EXCISION OF BREAST BIOPSY Left 1965   benign   EYE SURGERY Bilateral    Cataract Extraction with IOL   KYPHOPLASTY N/A 12/17/2016   Procedure: KYPHOPLASTY;  Surgeon: Kennedy Bucker, MD;  Location: [facility] ORS;  Service: Orthopedics;  Laterality: N/A;  l4   LUMBAR LAMINECTOMY  05/09/2017   L2-L5   VISCERAL ANGIOGRAPHY N/A 08/15/2017   Procedure: VISCERAL ANGIOGRAPHY;  Surgeon: Annice Needy, MD;  Location: [facility] INVASIVE CV LAB;  Service: Cardiovascular;  Laterality: N/A;   VISCERAL ANGIOGRAPHY N/A 10/05/2017   Procedure: VISCERAL ANGIOGRAPHY;  Surgeon: Annice Needy, MD;  Location: [facility] INVASIVE CV LAB;  Service: Cardiovascular;  Laterality: N/A;   VISCERAL ARTERY INTERVENTION N/A 08/15/2017   Procedure: VISCERAL ARTERY INTERVENTION;  Surgeon: Annice Needy, MD;  Location: [facility] INVASIVE CV LAB;  Service: Cardiovascular;  Laterality: N/A;    Current Medications: Current Meds  Medication Sig   acetaminophen (TYLENOL) 500 MG tablet Take 500 mg by mouth every 6 (six) hours as needed for mild pain.   Alfalfa 500 MG TABS Take 500 mg by mouth daily.   ALPRAZolam (XANAX) 0.25 MG tablet Take 0.5 tablets (0.125 mg total) by mouth as needed for  anxiety (for anxiety or elevated blood pressure).   amLODipine (NORVASC) 2.5 MG tablet Take 1 tablet (2.5 mg total) by mouth daily. As needed for systolic pressure > 150   Ascorbic Acid (VITAMIN C CR) 500 MG CPCR Take 500 mg by mouth daily.   aspirin EC 81 MG tablet Take 1 tablet (81 mg total) by mouth daily. Swallow whole.   Calcium Carbonate (CALCIUM 600 PO) Take by mouth.   Cholecalciferol (VITAMIN D3) 3000 UNITS TABS Take 3,000 Units by mouth 2 (two) times daily.    clotrimazole-betamethasone (LOTRISONE) cream Apply topically 2 (two) times daily.   diclofenac sodium (VOLTAREN) 1 % GEL Apply topically as needed.   fluticasone (FLONASE) 50 MCG/ACT nasal spray Place  1 spray into both nostrils daily.   Ginkgo Biloba (GNP GINGKO BILOBA EXTRACT PO) Take 1 tablet by mouth daily.   Glucosamine Sulfate 500 MG CAPS Take 500 mg by mouth daily.   hydrALAZINE (APRESOLINE) 10 MG tablet Take 10 mg by mouth as needed.   isosorbide mononitrate (IMDUR) 30 MG 24 hr tablet Take 1 tablet (30 mg total) by mouth daily.   Lactobacillus (PROBIOTIC ACIDOPHILUS PO) Take 1 capsule by mouth daily.    loratadine (CLARITIN) 10 MG tablet Take 10 mg by mouth daily as needed for allergies.   Magnesium 400 MG CAPS Take 400 mg by mouth daily.   Multiple Vitamin (MULTIVITAMIN) capsule Take 1 capsule by mouth daily.   mupirocin ointment (BACTROBAN) 2 % Apply to right leg wound daily - a small dab as needed once a day.   nitroGLYCERIN (NITROSTAT) 0.4 MG SL tablet DISSOLVE ONE TABLET UNDER THE TONGUE EVERY 5 MINUTES AS NEEDED FOR CHEST PAIN.  DO NOT EXCEED A TOTAL OF 3 DOSES IN 15 MINUTES   nystatin cream (MYCOSTATIN) Apply twice daily to corners of lips until redness resolves   Omega-3 Fatty Acids (OMEGA 3 PO) Take 520 mg by mouth daily.    Pramoxine-HC (HYDROCORTISONE ACE-PRAMOXINE) 2.5-1 % CREA Apply topically as needed.    rosuvastatin (CRESTOR) 10 MG tablet Take 1 tablet (10 mg total) by mouth every other day.   [DISCONTINUED] amLODipine (NORVASC) 2.5 MG tablet Take 1.25 mg by mouth daily as needed (whole tablet makes her feel "jittery").   [DISCONTINUED] metoprolol succinate (TOPROL XL) 25 MG 24 hr tablet Take 0.5 tablets (12.5 mg total) by mouth daily.     Allergies:   Ibandronic acid, Other, Prednisone, Actonel [risedronate], Amoxicillin, Pantoprazole, Raloxifene, Versed [midazolam], and Doxycycline   Social History   Socioeconomic History   Marital status: Widowed    Spouse name: [name]   Number of children: 3   Years of education: some college   Highest education level: Associate degree: academic program  Occupational History    Employer: RETIRED  Tobacco Use   Smoking  status: Never   Smokeless tobacco: Never   Tobacco comments:    smoking cessation materials not required  Vaping Use   Vaping status: Never Used  Substance and Sexual Activity   Alcohol use: No    Alcohol/week: 0.0 standard drinks of alcohol   Drug use: No   Sexual activity: Not Currently  Other Topics Concern   Not on file  Social History Narrative   Widowed   3 daughter   Social Determinants of Health   Financial Resource Strain: Low Risk  (05/20/2023)   Overall Financial Resource Strain (CARDIA)    Difficulty of Paying Living Expenses: Not hard at all  Food Insecurity: No Food Insecurity (  05/20/2023)   Hunger Vital Sign    Worried About Running Out of Food in the Last Year: Never true    Ran Out of Food in the Last Year: Never true  Transportation Needs: No Transportation Needs (05/20/2023)   PRAPARE - Administrator, Civil Service (Medical): No    Lack of Transportation (Non-Medical): No  Physical Activity: Inactive (05/20/2023)   Exercise Vital Sign    Days of Exercise per Week: 0 days    Minutes of Exercise per Session: 0 min  Stress: No Stress Concern Present (05/20/2023)   [employer] of Occupational Health - Occupational Stress Questionnaire    Feeling of Stress : Only a little  Social Connections: Moderately Isolated (05/20/2023)   Social Connection and Isolation Panel [NHANES]    Frequency of Communication with Friends and Family: More than three times a week    Frequency of Social Gatherings with Friends and Family: More than three times a week    Attends Religious Services: Never    Database administrator or Organizations: No    Attends Engineer, structural: More than 4 times per year    Marital Status: Widowed     Family History: The patient's family history includes Alcohol abuse in her sister; Cancer in her brother and brother; Diabetes in her brother, maternal grandmother, and sister; Heart disease in her maternal grandmother and  mother; Heart disease (age of onset: 19) in her brother; Hypertension in her father; Kidney disease in her sister; Stroke in her brother.  ROS:   Please see the history of present illness.     All other systems reviewed and are negative.  EKGs/Labs/Other Studies Reviewed:    The following studies were reviewed today:   Recent Labs: 03/10/2023: TSH 2.13 04/19/2023: ALT 21 09/26/2023: BUN 15; Creatinine, Ser 0.46; Hemoglobin 14.5; Platelets 201; Potassium 4.0; Sodium 140  Recent Lipid Panel    Component Value Date/Time   CHOL 193 03/10/2023 1214   CHOL 205 (H) 12/19/2015 1422   TRIG 119.0 03/10/2023 1214   HDL 73.40 03/10/2023 1214   HDL 84 12/19/2015 1422   CHOLHDL 3 03/10/2023 1214   VLDL 23.8 03/10/2023 1214   LDLCALC 96 03/10/2023 1214   LDLCALC 63 01/01/2019 1238   LDLDIRECT 96.0 03/10/2023 1214    Physical Exam:    VS:  BP 136/62 (BP Location: Left Arm, Patient Position: Sitting, Cuff Size: Normal)   Pulse (!) 58   Ht 5' (1.524 m)   Wt 99 lb (44.9 kg)   SpO2 97%   BMI 19.33 kg/m     Wt Readings from Last 3 Encounters:  10/12/23 99 lb (44.9 kg)  09/28/23 98 lb 12.8 oz (44.8 kg)  09/26/23 97 lb 7.1 oz (44.2 kg)     GEN:  Well nourished, well developed in no acute distress HEENT: Normal NECK: No JVD; No carotid bruits CARDIAC: RRR, no murmurs, rubs, gallops RESPIRATORY:  Clear to auscultation without rales, wheezing or rhonchi  ABDOMEN: Soft, non-tender, non-distended MUSCULOSKELETAL:  No edema; No deformity  SKIN: Warm and dry NEUROLOGIC:  Alert and oriented x 3 PSYCHIATRIC:  Normal affect   ASSESSMENT:    1. Frequent PVCs   2. Primary hypertension     PLAN:    In order of problems listed above:  Frequent PVCs, palpitations, echo with preserved EF.  Heart rate 51-57.  Stop Toprol-XL 12.5 mg daily due to fatigue, bradycardia. Hypertension, BP controlled.  Patient with symptoms of fatigue.  Stop Toprol-XL, start Norvasc 2.5 mg daily, continue Imdur 30  mg daily.    Follow-up in 3 months..   Medication Adjustments/Labs and Tests Ordered: Current medicines are reviewed at length with the patient today.  Concerns regarding medicines are outlined above.  No orders of the defined types were placed in this encounter.   Meds ordered this encounter  Medications   amLODipine (NORVASC) 2.5 MG tablet    Sig: Take 1 tablet (2.5 mg total) by mouth daily.    Dispense:  90 tablet    Refill:  3     Patient Instructions  Medication Instructions:  Your physician recommends the following medication changes.  STOP TAKING: Metoprolol Succinate 25 MG daily  START TAKING: Amlodipine 2.5 MG daily.   *If you need a refill on your cardiac medications before your next appointment, please call your pharmacy*   Lab Work: None ordered.  If you have labs (blood work) drawn today and your tests are completely normal, you will receive your results only by: MyChart Message (if you have MyChart) OR A paper copy in the mail If you have any lab test that is abnormal or we need to change your treatment, we will call you to review the results.   Testing/Procedures: None ordered.    Follow-Up: At [facility], you and your health needs are our priority.  As part of our continuing mission to provide you with exceptional heart care, we have created designated Provider Care Teams.  These Care Teams include your primary Cardiologist (physician) and Advanced Practice Providers (APPs -  Physician Assistants and Nurse Practitioners) who all work together to provide you with the care you need, when you need it.  We recommend signing up for the patient portal called "MyChart".  Sign up information is provided on this After Visit Summary.  MyChart is used to connect with patients for Virtual Visits (Telemedicine).  Patients are able to view lab/test results, encounter notes, upcoming appointments, etc.  Non-urgent messages can be sent to your provider as  well.   To learn more about what you can do with MyChart, go to [email or web address].    Your next appointment:   3 month(s)  Provider:   You may see Debbe Odea, MD or one of the following Advanced Practice Providers on your designated Care Team:   Nicolasa Ducking, NP Eula Listen, PA-C Cadence Fransico Michael, PA-C Charlsie Quest, NP Carlos Levering, NP        Signed, Debbe Odea, MD  10/12/2023 12:40 PM    [facility]

## 2023-10-12 NOTE — Patient Instructions (Signed)
Medication Instructions:  Your physician recommends the following medication changes.  STOP TAKING: Metoprolol Succinate 25 MG daily  START TAKING: Amlodipine 2.5 MG daily.   *If you need a refill on your cardiac medications before your next appointment, please call your pharmacy*   Lab Work: None ordered.  If you have labs (blood work) drawn today and your tests are completely normal, you will receive your results only by: MyChart Message (if you have MyChart) OR A paper copy in the mail If you have any lab test that is abnormal or we need to change your treatment, we will call you to review the results.   Testing/Procedures: None ordered.    Follow-Up: At [facility], you and your health needs are our priority.  As part of our continuing mission to provide you with exceptional heart care, we have created designated Provider Care Teams.  These Care Teams include your primary Cardiologist (physician) and Advanced Practice Providers (APPs -  Physician Assistants and Nurse Practitioners) who all work together to provide you with the care you need, when you need it.  We recommend signing up for the patient portal called "MyChart".  Sign up information is provided on this After Visit Summary.  MyChart is used to connect with patients for Virtual Visits (Telemedicine).  Patients are able to view lab/test results, encounter notes, upcoming appointments, etc.  Non-urgent messages can be sent to your provider as well.   To learn more about what you can do with MyChart, go to [email or web address].    Your next appointment:   3 month(s)  Provider:   You may see Debbe Odea, MD or one of the following Advanced Practice Providers on your designated Care Team:   Nicolasa Ducking, NP Eula Listen, PA-C Cadence Fransico Michael, PA-C Charlsie Quest, NP Carlos Levering, NP

## 2023-10-14 ENCOUNTER — Other Ambulatory Visit (INDEPENDENT_AMBULATORY_CARE_PROVIDER_SITE_OTHER): Payer: HMO

## 2023-10-14 DIAGNOSIS — E782 Mixed hyperlipidemia: Secondary | ICD-10-CM | POA: Diagnosis not present

## 2023-10-14 LAB — COMPREHENSIVE METABOLIC PANEL
ALT: 16 U/L (ref 0–35)
AST: 31 U/L (ref 0–37)
Albumin: 4.2 g/dL (ref 3.5–5.2)
Alkaline Phosphatase: 47 U/L (ref 39–117)
BUN: 12 mg/dL (ref 6–23)
CO2: 31 meq/L (ref 19–32)
Calcium: 9.1 mg/dL (ref 8.4–10.5)
Chloride: 99 meq/L (ref 96–112)
Creatinine, Ser: 0.61 mg/dL (ref 0.40–1.20)
GFR: 76.77 mL/min (ref >60.00)
Glucose, Bld: 140 mg/dL — ABNORMAL HIGH (ref 70–99)
Potassium: 3.9 meq/L (ref 3.5–5.1)
Sodium: 136 meq/L (ref 135–145)
Total Bilirubin: 0.8 mg/dL (ref 0.2–1.2)
Total Protein: 7 g/dL (ref 6.0–8.3)

## 2023-10-14 LAB — LIPID PANEL
Cholesterol: 165 mg/dL (ref 0–200)
HDL: 67.1 mg/dL (ref >39.00)
LDL Cholesterol: 84 mg/dL (ref 0–99)
NonHDL: 97.77
Total CHOL/HDL Ratio: 2
Triglycerides: 69 mg/dL (ref 0.0–149.0)
VLDL: 13.8 mg/dL (ref 0.0–40.0)

## 2023-10-17 ENCOUNTER — Encounter: Payer: Self-pay | Admitting: Internal Medicine

## 2023-10-17 ENCOUNTER — Ambulatory Visit (INDEPENDENT_AMBULATORY_CARE_PROVIDER_SITE_OTHER): Payer: HMO | Admitting: Internal Medicine

## 2023-10-17 VITALS — BP 124/64 | HR 65 | Ht 60.0 in | Wt 97.4 lb

## 2023-10-17 DIAGNOSIS — R296 Repeated falls: Secondary | ICD-10-CM

## 2023-10-17 DIAGNOSIS — R918 Other nonspecific abnormal finding of lung field: Secondary | ICD-10-CM | POA: Diagnosis not present

## 2023-10-17 DIAGNOSIS — I10 Essential (primary) hypertension: Secondary | ICD-10-CM | POA: Diagnosis not present

## 2023-10-17 DIAGNOSIS — I700 Atherosclerosis of aorta: Secondary | ICD-10-CM | POA: Diagnosis not present

## 2023-10-17 MED ORDER — ROSUVASTATIN CALCIUM 10 MG PO TABS: 10.0000 mg | ORAL_TABLET | ORAL | 1 refills | Status: DC

## 2023-10-17 MED ORDER — ISOSORBIDE MONONITRATE ER 30 MG PO TB24: 30.0000 mg | ORAL_TABLET | Freq: Every day | ORAL | 1 refills | Status: DC

## 2023-10-17 NOTE — Assessment & Plan Note (Signed)
BP has been difficult to control due to recurrent epsiodes of intolerance of normal readings/ Her recurrent episodes of feeling light headed and sluggish  do not appear to be caused by poor perfusion of brain  based on results of   MRA brain .  Home BP machine is NOT accurate and registering 40 pts higher than office readings,

## 2023-10-17 NOTE — Progress Notes (Unsigned)
Subjective:  Patient ID: [name], female    DOB: December 05, 1926  Age: 89 y.o. MRN: [id number]  CC: {There were no encounter diagnoses. (Refresh or delete this SmartLink)}   HPI [name] presents for  Chief Complaint  Patient presents with   Medical Management of Chronic Issues    4 month follow up    [name] is a 89 yr old female seen < 3 weeks ago for ER follow up on TIA symptoms.   Seen Dec  4 by cardiology.  Metoprolol stopped due to c/o fatigue and amlodipine 2.5 mg daily started. She reduced the dose to 1.25 mg because it made her head fell fuzzy,  which has improved.    She feel last week onto her right side (tripped over the kitchen rug)  feels liek the right foot did't clear the floor and wouldn't move with her as she pivoted. . Refused help getting up except to use a chair. Had bruising and swelling of the elbow and n upper arm which is impro  Has been more afraid of walking aloe or going outside    Wants to have home PT.   alone      Outpatient Medications Prior to Visit  Medication Sig Dispense Refill   acetaminophen (TYLENOL) 500 MG tablet Take 500 mg by mouth every 6 (six) hours as needed for mild pain.     Alfalfa 500 MG TABS Take 500 mg by mouth daily.     amLODipine (NORVASC) 2.5 MG tablet Take 1 tablet (2.5 mg total) by mouth daily. 90 tablet 3   Ascorbic Acid (VITAMIN C CR) 500 MG CPCR Take 500 mg by mouth daily.     aspirin EC 81 MG tablet Take 1 tablet (81 mg total) by mouth daily. Swallow whole. 90 tablet 2   Calcium Carbonate (CALCIUM 600 PO) Take by mouth.     Cholecalciferol (VITAMIN D3) 3000 UNITS TABS Take 3,000 Units by mouth 2 (two) times daily.      clotrimazole-betamethasone (LOTRISONE) cream Apply topically 2 (two) times daily. 30 g 1   diclofenac sodium (VOLTAREN) 1 % GEL Apply topically as needed.     fluticasone (FLONASE) 50 MCG/ACT nasal spray Place 1 spray into both nostrils daily.     Ginkgo Biloba (GNP GINGKO BILOBA  EXTRACT PO) Take 1 tablet by mouth daily.     Glucosamine Sulfate 500 MG CAPS Take 500 mg by mouth daily.     isosorbide mononitrate (IMDUR) 30 MG 24 hr tablet Take 1 tablet (30 mg total) by mouth daily. 90 tablet 1   Lactobacillus (PROBIOTIC ACIDOPHILUS PO) Take 1 capsule by mouth daily.      loratadine (CLARITIN) 10 MG tablet Take 10 mg by mouth daily as needed for allergies.     Magnesium 400 MG CAPS Take 400 mg by mouth daily.     Multiple Vitamin (MULTIVITAMIN) capsule Take 1 capsule by mouth daily.     mupirocin ointment (BACTROBAN) 2 % Apply to right leg wound daily - a small dab as needed once a day. 15 g 0   nitroGLYCERIN (NITROSTAT) 0.4 MG SL tablet DISSOLVE ONE TABLET UNDER THE TONGUE EVERY 5 MINUTES AS NEEDED FOR CHEST PAIN.  DO NOT EXCEED A TOTAL OF 3 DOSES IN 15 MINUTES 50 tablet 0   nystatin cream (MYCOSTATIN) Apply twice daily to corners of lips until redness resolves 15 g 2   Omega-3 Fatty Acids (OMEGA 3 PO) Take 520 mg by mouth daily.  Pramoxine-HC (HYDROCORTISONE ACE-PRAMOXINE) 2.5-1 % CREA Apply topically as needed.      rosuvastatin (CRESTOR) 10 MG tablet Take 1 tablet (10 mg total) by mouth every other day. 45 tablet 1   ALPRAZolam (XANAX) 0.25 MG tablet Take 0.5 tablets (0.125 mg total) by mouth as needed for anxiety (for anxiety or elevated blood pressure). (Patient not taking: Reported on 10/17/2023) 20 tablet 0   hydrALAZINE (APRESOLINE) 10 MG tablet Take 10 mg by mouth as needed. (Patient not taking: Reported on 10/17/2023)     amLODipine (NORVASC) 2.5 MG tablet Take 1 tablet (2.5 mg total) by mouth daily. As needed for systolic pressure > 150 90 tablet 0   No facility-administered medications prior to visit.    Review of Systems;  Patient denies headache, fevers, malaise, unintentional weight loss, skin rash, eye pain, sinus congestion and sinus pain, sore throat, dysphagia,  hemoptysis , cough, dyspnea, wheezing, chest pain, palpitations, orthopnea, edema,  abdominal pain, nausea, melena, diarrhea, constipation, flank pain, dysuria, hematuria, urinary  Frequency, nocturia, numbness, tingling, seizures,  Focal weakness, Loss of consciousness,  Tremor, insomnia, depression, anxiety, and suicidal ideation.      Objective:  BP (!) 150/72   Pulse 65   Ht 5' (1.524 m)   Wt 97 lb 6.4 oz (44.2 kg)   SpO2 99%   BMI 19.02 kg/m   BP Readings from Last 3 Encounters:  10/17/23 (!) 150/72  10/12/23 136/62  09/28/23 122/70    Wt Readings from Last 3 Encounters:  10/17/23 97 lb 6.4 oz (44.2 kg)  10/12/23 99 lb (44.9 kg)  09/28/23 98 lb 12.8 oz (44.8 kg)    Physical Exam  Lab Results  Component Value Date   HGBA1C 7.0 (H) 11/16/2021   HGBA1C 6.1 (H) 05/14/2019   HGBA1C 6.2 (H) 01/01/2019    Lab Results  Component Value Date   CREATININE 0.61 10/14/2023   CREATININE 0.46 09/26/2023   CREATININE 0.71 04/19/2023    Lab Results  Component Value Date   WBC 4.2 09/26/2023   HGB 14.5 09/26/2023   HCT 43.6 09/26/2023   PLT 201 09/26/2023   GLUCOSE 140 (H) 10/14/2023   CHOL 165 10/14/2023   TRIG 69.0 10/14/2023   HDL 67.10 10/14/2023   LDLDIRECT 96.0 03/10/2023   LDLCALC 84 10/14/2023   ALT 16 10/14/2023   AST 31 10/14/2023   NA 136 10/14/2023   K 3.9 10/14/2023   CL 99 10/14/2023   CREATININE 0.61 10/14/2023   BUN 12 10/14/2023   CO2 31 10/14/2023   TSH 2.13 03/10/2023   HGBA1C 7.0 (H) 11/16/2021   MICROALBUR <0.7 11/16/2021    CT Chest W Contrast  Result Date: 10/16/2023 CLINICAL DATA:  Non-small cell lung cancer. Assess treatment response. * Tracking Code: BO * EXAM: CT CHEST WITH CONTRAST TECHNIQUE: Multidetector CT imaging of the chest was performed during intravenous contrast administration. RADIATION DOSE REDUCTION: This exam was performed according to the departmental dose-optimization program which includes automated exposure control, adjustment of the mA and/or kV according to patient size and/or use of iterative  reconstruction technique. CONTRAST:  60mL OMNIPAQUE IOHEXOL 300 MG/ML  SOLN COMPARISON: 03/14/2023 FINDINGS: Cardiovascular: The heart size appears within normal limits. Aortic atherosclerosis. Coronary artery atherosclerotic calcifications. No pericardial effusion. Mediastinum/Nodes: Thyroid gland, trachea, and esophagus are unremarkable. Right paratracheal lymph nodes are again noted and appear unchanged. These measure up to 8 mm short axis, image 57/2 and image 48/2. No enlarged hilar lymph nodes. No axillary adenopathy. Lungs/Pleura: No  pleural effusion, airspace consolidation, atelectasis or pneumothorax. Unchanged appearance residual nodularity and ground-glass in the apical segment of the right upper lobe mild cylindrical bronchiectasis. Scattered areas of mucoid impaction. No suspicious pulmonary nodule. No pleural effusion or airspace consolidation. Upper Abdomen: No acute findings. Left adrenal nodule is stable measuring 1.5 by 1.4 cm, image 138/2. Unchanged cyst within segment 4 measuring 1.1 cm, image 163/2. Previous cholecystectomy. Musculoskeletal: Degenerative changes noted within the spine. Osteopenia. L2 compression deformity is stable from previous exam. No acute or suspicious osseous findings. IMPRESSION: 1. Stable exam. No signs of recurrent or metastatic disease. 2. Unchanged appearance of residual nodularity and ground-glass in the apical segment of the right upper lobe. 3. Coronary artery calcifications. 4. Stable left adrenal adenoma. 5.  Aortic Atherosclerosis (ICD10-I70.0). Electronically Signed   By: Signa Kell M.D.   On: 10/16/2023 08:37    Assessment & Plan:  .There are no diagnoses linked to this encounter.   I provided 30 minutes of face-to-face time during this encounter reviewing patient's last visit with me, patient's  most recent visit with cardiology,  nephrology,  and neurology,  recent surgical and non surgical procedures, previous  labs and imaging studies,  counseling on currently addressed issues,  and post visit ordering to diagnostics and therapeutics .   Follow-up: No follow-ups on file.   Sherlene Shams, MD

## 2023-10-17 NOTE — Patient Instructions (Signed)
The amlodipine and Imdur both lower blood pressure by dilating your blood vessels .  They do not affect your heart rate.  Your blood pressure  is perfect today    The most common side effect is fluid retention in the legs   I have ordered home physical therapy

## 2023-10-17 NOTE — Assessment & Plan Note (Signed)
RUL.  Smaller in size on repeat CT Nov 8.  Unchanged by repeat CT done Dec 2024. Results reviewed with patient and daughter Lynnell Dike today

## 2023-10-18 DIAGNOSIS — R296 Repeated falls: Secondary | ICD-10-CM | POA: Insufficient documentation

## 2023-10-18 NOTE — Assessment & Plan Note (Addendum)
Again noted on recent chest CT.  Currently tolerating rosuvastatin every other day dosing,  more aggressive therapy declined  due to age,  and patient's concern for myalgias which would aggravate her anxiety.   Recent mesenteric ultrasound showed no significant stenosis of CA, SMA or  IMA   Lab Results  Component Value Date   CHOL 165 10/14/2023   HDL 67.10 10/14/2023   LDLCALC 84 10/14/2023   LDLDIRECT 96.0 03/10/2023   TRIG 69.0 10/14/2023   CHOLHDL 2 10/14/2023

## 2023-10-18 NOTE — Assessment & Plan Note (Signed)
She is an excellent candidate for Home PT  which has been ordered

## 2023-10-19 ENCOUNTER — Telehealth: Payer: Self-pay | Admitting: Internal Medicine

## 2023-10-19 NOTE — Telephone Encounter (Signed)
[facility] called and states will go out on 12/13 to open patient for PT services

## 2023-10-19 NOTE — Telephone Encounter (Signed)
FYI

## 2023-10-21 DIAGNOSIS — Z9181 History of falling: Secondary | ICD-10-CM | POA: Diagnosis not present

## 2023-10-21 DIAGNOSIS — Z5982 Transportation insecurity: Secondary | ICD-10-CM | POA: Diagnosis not present

## 2023-10-21 DIAGNOSIS — T148XXD Other injury of unspecified body region, subsequent encounter: Secondary | ICD-10-CM | POA: Diagnosis not present

## 2023-10-21 DIAGNOSIS — I10 Essential (primary) hypertension: Secondary | ICD-10-CM | POA: Diagnosis not present

## 2023-10-21 DIAGNOSIS — I700 Atherosclerosis of aorta: Secondary | ICD-10-CM | POA: Diagnosis not present

## 2023-10-21 DIAGNOSIS — I2510 Atherosclerotic heart disease of native coronary artery without angina pectoris: Secondary | ICD-10-CM | POA: Diagnosis not present

## 2023-10-21 DIAGNOSIS — R296 Repeated falls: Secondary | ICD-10-CM | POA: Diagnosis not present

## 2023-10-21 DIAGNOSIS — H9193 Unspecified hearing loss, bilateral: Secondary | ICD-10-CM | POA: Diagnosis not present

## 2023-10-21 DIAGNOSIS — D3502 Benign neoplasm of left adrenal gland: Secondary | ICD-10-CM | POA: Diagnosis not present

## 2023-10-21 DIAGNOSIS — R58 Hemorrhage, not elsewhere classified: Secondary | ICD-10-CM | POA: Diagnosis not present

## 2023-10-21 DIAGNOSIS — R918 Other nonspecific abnormal finding of lung field: Secondary | ICD-10-CM | POA: Diagnosis not present

## 2023-10-21 DIAGNOSIS — Z602 Problems related to living alone: Secondary | ICD-10-CM | POA: Diagnosis not present

## 2023-10-21 DIAGNOSIS — Z79899 Other long term (current) drug therapy: Secondary | ICD-10-CM | POA: Diagnosis not present

## 2023-10-21 DIAGNOSIS — W19XXXD Unspecified fall, subsequent encounter: Secondary | ICD-10-CM | POA: Diagnosis not present

## 2023-10-28 DIAGNOSIS — H9193 Unspecified hearing loss, bilateral: Secondary | ICD-10-CM | POA: Diagnosis not present

## 2023-10-28 DIAGNOSIS — D3502 Benign neoplasm of left adrenal gland: Secondary | ICD-10-CM | POA: Diagnosis not present

## 2023-10-28 DIAGNOSIS — R296 Repeated falls: Secondary | ICD-10-CM | POA: Diagnosis not present

## 2023-10-28 DIAGNOSIS — Z79899 Other long term (current) drug therapy: Secondary | ICD-10-CM | POA: Diagnosis not present

## 2023-10-28 DIAGNOSIS — I10 Essential (primary) hypertension: Secondary | ICD-10-CM | POA: Diagnosis not present

## 2023-10-28 DIAGNOSIS — R918 Other nonspecific abnormal finding of lung field: Secondary | ICD-10-CM | POA: Diagnosis not present

## 2023-10-28 DIAGNOSIS — I700 Atherosclerosis of aorta: Secondary | ICD-10-CM | POA: Diagnosis not present

## 2023-10-28 DIAGNOSIS — Z5982 Transportation insecurity: Secondary | ICD-10-CM | POA: Diagnosis not present

## 2023-10-28 DIAGNOSIS — Z602 Problems related to living alone: Secondary | ICD-10-CM | POA: Diagnosis not present

## 2023-10-28 DIAGNOSIS — T148XXD Other injury of unspecified body region, subsequent encounter: Secondary | ICD-10-CM | POA: Diagnosis not present

## 2023-10-28 DIAGNOSIS — I2510 Atherosclerotic heart disease of native coronary artery without angina pectoris: Secondary | ICD-10-CM | POA: Diagnosis not present

## 2023-10-28 DIAGNOSIS — R58 Hemorrhage, not elsewhere classified: Secondary | ICD-10-CM | POA: Diagnosis not present

## 2023-11-08 ENCOUNTER — Ambulatory Visit: Payer: HMO | Admitting: Cardiology

## 2024-01-03 ENCOUNTER — Ambulatory Visit (INDEPENDENT_AMBULATORY_CARE_PROVIDER_SITE_OTHER): Payer: HMO | Admitting: Internal Medicine

## 2024-01-03 ENCOUNTER — Encounter: Payer: Self-pay | Admitting: Internal Medicine

## 2024-01-03 VITALS — BP 150/82 | HR 70 | Ht 60.0 in | Wt 98.2 lb

## 2024-01-03 DIAGNOSIS — E559 Vitamin D deficiency, unspecified: Secondary | ICD-10-CM | POA: Diagnosis not present

## 2024-01-03 DIAGNOSIS — R296 Repeated falls: Secondary | ICD-10-CM

## 2024-01-03 DIAGNOSIS — R5383 Other fatigue: Secondary | ICD-10-CM | POA: Diagnosis not present

## 2024-01-03 DIAGNOSIS — L309 Dermatitis, unspecified: Secondary | ICD-10-CM | POA: Diagnosis not present

## 2024-01-03 DIAGNOSIS — I10 Essential (primary) hypertension: Secondary | ICD-10-CM

## 2024-01-03 DIAGNOSIS — M19041 Primary osteoarthritis, right hand: Secondary | ICD-10-CM

## 2024-01-03 DIAGNOSIS — M19042 Primary osteoarthritis, left hand: Secondary | ICD-10-CM | POA: Diagnosis not present

## 2024-01-03 DIAGNOSIS — Z66 Do not resuscitate: Secondary | ICD-10-CM

## 2024-01-03 DIAGNOSIS — D0439 Carcinoma in situ of skin of other parts of face: Secondary | ICD-10-CM | POA: Diagnosis not present

## 2024-01-03 MED ORDER — ISOSORBIDE MONONITRATE ER 30 MG PO TB24: 30.0000 mg | ORAL_TABLET | Freq: Every day | ORAL | 1 refills | Status: DC

## 2024-01-03 MED ORDER — ROSUVASTATIN CALCIUM 10 MG PO TABS: 10.0000 mg | ORAL_TABLET | ORAL | 1 refills | Status: DC

## 2024-01-03 NOTE — Assessment & Plan Note (Signed)
 Advised to schedule tylenol to manage pain

## 2024-01-03 NOTE — Patient Instructions (Addendum)
 Stop worrying about your health!     Worrying does not add one day to your life!  That's what Jesus taught [place]!  Start treating your arthritis   You can add up to 2000 mg of acetominophen (tylenol) every day safely  In divided doses   (1000 mg twice daily )  to manage your joint pain   Do not change your BP medication regimen until you have a new machine that you can verify is accurate    The ShingRx vaccine is now available in local pharmacies and is much more protective than the old one.    The RSV vaccine  is an excellent idea for anyone with asthma, chronic bronchitis, or COPD or extreme age. .  It is available and safe  for everyone over 65 but not as important as the annual flu for everyone else.

## 2024-01-03 NOTE — Progress Notes (Signed)
 Subjective:  Patient ID: [name], female    DOB: January 22, 1927  Age: 89 y.o. MRN: [id number]  CC: The primary encounter diagnosis was Other fatigue. Diagnoses of Vitamin D deficiency, Primary osteoarthritis of both hands, Essential hypertension, DNAR (do not attempt resuscitation), and Recurrent falls were also pertinent to this visit.   HPI [name] presents for  Chief Complaint  Patient presents with   Medical Management of Chronic Issues    Seen > 3 months ago  home health PT ordered .  Had 5 weeks  and PT  stopped due to improvement.  Has not had not any falls since then   Joints aching,  having trouble adjusting to her age and her loss f vitality and generalized weakness.  Not taking anything regularly for pain .  [name]  Has episodes  of weakness   tat 'jump on her"    HTN:  not checking her BP as often as she used to. .checks when she feels bad  Feels jitteriness inside   appetite excellent ,  eats a large breakfast of cereal bran muffin     Outpatient Medications Prior to Visit  Medication Sig Dispense Refill   acetaminophen (TYLENOL) 500 MG tablet Take 500 mg by mouth every 6 (six) hours as needed for mild pain.     Alfalfa 500 MG TABS Take 500 mg by mouth daily.     amLODipine (NORVASC) 2.5 MG tablet Take 1 tablet (2.5 mg total) by mouth daily. 90 tablet 3   Ascorbic Acid (VITAMIN C CR) 500 MG CPCR Take 500 mg by mouth daily.     aspirin EC 81 MG tablet Take 1 tablet (81 mg total) by mouth daily. Swallow whole. 90 tablet 2   Calcium Carbonate (CALCIUM 600 PO) Take by mouth.     Cholecalciferol (VITAMIN D3) 3000 UNITS TABS Take 3,000 Units by mouth 2 (two) times daily.      clotrimazole-betamethasone (LOTRISONE) cream Apply topically 2 (two) times daily. 30 g 1   diclofenac sodium (VOLTAREN) 1 % GEL Apply topically as needed.     fluticasone (FLONASE) 50 MCG/ACT nasal spray Place 1 spray into both nostrils daily.     Ginkgo Biloba (GNP GINGKO BILOBA  EXTRACT PO) Take 1 tablet by mouth daily.     Glucosamine Sulfate 500 MG CAPS Take 500 mg by mouth daily.     Lactobacillus (PROBIOTIC ACIDOPHILUS PO) Take 1 capsule by mouth daily.      loratadine (CLARITIN) 10 MG tablet Take 10 mg by mouth daily as needed for allergies.     Magnesium 400 MG CAPS Take 400 mg by mouth daily.     Multiple Vitamin (MULTIVITAMIN) capsule Take 1 capsule by mouth daily.     mupirocin ointment (BACTROBAN) 2 % Apply to right leg wound daily - a small dab as needed once a day. 15 g 0   nitroGLYCERIN (NITROSTAT) 0.4 MG SL tablet DISSOLVE ONE TABLET UNDER THE TONGUE EVERY 5 MINUTES AS NEEDED FOR CHEST PAIN.  DO NOT EXCEED A TOTAL OF 3 DOSES IN 15 MINUTES 50 tablet 0   nystatin cream (MYCOSTATIN) Apply twice daily to corners of lips until redness resolves 15 g 2   Omega-3 Fatty Acids (OMEGA 3 PO) Take 520 mg by mouth daily.      Pramoxine-HC (HYDROCORTISONE ACE-PRAMOXINE) 2.5-1 % CREA Apply topically as needed.      isosorbide mononitrate (IMDUR) 30 MG 24 hr tablet Take 1 tablet (30 mg total)  by mouth daily. 90 tablet 1   rosuvastatin (CRESTOR) 10 MG tablet Take 1 tablet (10 mg total) by mouth every other day. 45 tablet 1   No facility-administered medications prior to visit.    Review of Systems;  Patient denies headache, fevers, malaise, unintentional weight loss, skin rash, eye pain, sinus congestion and sinus pain, sore throat, dysphagia,  hemoptysis , cough, dyspnea, wheezing, chest pain, palpitations, orthopnea, edema, abdominal pain, nausea, melena, diarrhea, constipation, flank pain, dysuria, hematuria, urinary  Frequency, nocturia, numbness, tingling, seizures,  Focal weakness, Loss of consciousness,  Tremor, insomnia, depression, anxiety, and suicidal ideation.      Objective:  BP (!) 150/82   Pulse 70   Ht 5' (1.524 m)   Wt 98 lb 3.2 oz (44.5 kg)   SpO2 96%   BMI 19.18 kg/m   BP Readings from Last 3 Encounters:  01/03/24 (!) 150/82  10/17/23 124/64   10/12/23 136/62    Wt Readings from Last 3 Encounters:  01/03/24 98 lb 3.2 oz (44.5 kg)  10/17/23 97 lb 6.4 oz (44.2 kg)  10/12/23 99 lb (44.9 kg)    Physical Exam Vitals reviewed.  Constitutional:      General: She is not in acute distress.    Appearance: Normal appearance. She is normal weight. She is not ill-appearing, toxic-appearing or diaphoretic.  HENT:     Head: Normocephalic.  Eyes:     General: No scleral icterus.       Right eye: No discharge.        Left eye: No discharge.     Conjunctiva/sclera: Conjunctivae normal.  Cardiovascular:     Rate and Rhythm: Normal rate and regular rhythm.     Heart sounds: Normal heart sounds.  Pulmonary:     Effort: Pulmonary effort is normal. No respiratory distress.     Breath sounds: Normal breath sounds.  Musculoskeletal:        General: Normal range of motion.  Skin:    General: Skin is warm and dry.  Neurological:     General: No focal deficit present.     Mental Status: She is alert and oriented to person, place, and time. Mental status is at baseline.  Psychiatric:        Mood and Affect: Mood normal.        Behavior: Behavior normal.        Thought Content: Thought content normal.        Judgment: Judgment normal.    Lab Results  Component Value Date   HGBA1C 7.0 (H) 11/16/2021   HGBA1C 6.1 (H) 05/14/2019   HGBA1C 6.2 (H) 01/01/2019    Lab Results  Component Value Date   CREATININE 0.61 10/14/2023   CREATININE 0.46 09/26/2023   CREATININE 0.71 04/19/2023    Lab Results  Component Value Date   WBC 4.2 09/26/2023   HGB 14.5 09/26/2023   HCT 43.6 09/26/2023   PLT 201 09/26/2023   GLUCOSE 140 (H) 10/14/2023   CHOL 165 10/14/2023   TRIG 69.0 10/14/2023   HDL 67.10 10/14/2023   LDLDIRECT 96.0 03/10/2023   LDLCALC 84 10/14/2023   ALT 16 10/14/2023   AST 31 10/14/2023   NA 136 10/14/2023   K 3.9 10/14/2023   CL 99 10/14/2023   CREATININE 0.61 10/14/2023   BUN 12 10/14/2023   CO2 31 10/14/2023    TSH 2.13 03/10/2023   HGBA1C 7.0 (H) 11/16/2021   MICROALBUR <0.7 11/16/2021    CT Chest W Contrast  Result Date: 10/16/2023 CLINICAL DATA:  Non-small cell lung cancer. Assess treatment response. * Tracking Code: BO * EXAM: CT CHEST WITH CONTRAST TECHNIQUE: Multidetector CT imaging of the chest was performed during intravenous contrast administration. RADIATION DOSE REDUCTION: This exam was performed according to the departmental dose-optimization program which includes automated exposure control, adjustment of the mA and/or kV according to patient size and/or use of iterative reconstruction technique. CONTRAST:  60mL OMNIPAQUE IOHEXOL 300 MG/ML  SOLN COMPARISON: 03/14/2023 FINDINGS: Cardiovascular: The heart size appears within normal limits. Aortic atherosclerosis. Coronary artery atherosclerotic calcifications. No pericardial effusion. Mediastinum/Nodes: Thyroid gland, trachea, and esophagus are unremarkable. Right paratracheal lymph nodes are again noted and appear unchanged. These measure up to 8 mm short axis, image 57/2 and image 48/2. No enlarged hilar lymph nodes. No axillary adenopathy. Lungs/Pleura: No pleural effusion, airspace consolidation, atelectasis or pneumothorax. Unchanged appearance residual nodularity and ground-glass in the apical segment of the right upper lobe mild cylindrical bronchiectasis. Scattered areas of mucoid impaction. No suspicious pulmonary nodule. No pleural effusion or airspace consolidation. Upper Abdomen: No acute findings. Left adrenal nodule is stable measuring 1.5 by 1.4 cm, image 138/2. Unchanged cyst within segment 4 measuring 1.1 cm, image 163/2. Previous cholecystectomy. Musculoskeletal: Degenerative changes noted within the spine. Osteopenia. L2 compression deformity is stable from previous exam. No acute or suspicious osseous findings. IMPRESSION: 1. Stable exam. No signs of recurrent or metastatic disease. 2. Unchanged appearance of residual nodularity and  ground-glass in the apical segment of the right upper lobe. 3. Coronary artery calcifications. 4. Stable left adrenal adenoma. 5.  Aortic Atherosclerosis (ICD10-I70.0). Electronically Signed   By: Signa Kell M.D.   On: 10/16/2023 08:37    Assessment & Plan:  .Other fatigue -     TSH -     B12 and Folate Panel  Vitamin D deficiency -     VITAMIN D 25 Hydroxy (Vit-D Deficiency, Fractures)  Primary osteoarthritis of both hands Assessment & Plan: Advised to schedule tylenol to manage pain    Essential hypertension Assessment & Plan: BP has been difficult to control due to recurrent epsiodes of intolerance of normal readings, which may be due to her faulty BP monitor (discovered today).  She is now taking 1.25 mg amlodipine and 30 mg Imdur daily.   Her recurrent episodes of feeling light headed and sluggish  do not appear to be caused by poor perfusion of brain  based on results of   MRA brain .  Home BP machine is NOT accurate and registering 40 pts higher than office readings.  No changes today   DNAR (do not attempt resuscitation) Assessment & Plan: Discussed with patient April 19, 2017  Orders: -     Do not attempt resuscitation (DNR)  Recurrent falls Assessment & Plan: She  completed 5 weeks of physical therapy and has not had a fall in several months    Other orders -     Isosorbide Mononitrate ER; Take 1 tablet (30 mg total) by mouth daily.  Dispense: 90 tablet; Refill: 1 -     Rosuvastatin Calcium; Take 1 tablet (10 mg total) by mouth every other day.  Dispense: 45 tablet; Refill: 1     I spent 34 minutes on the day of this face to face encounter reviewing patient's  most recent visit with cardiology,  n, recent  labs and imaging studies, counseling on her pessimistic view of her life  and encouraging her to celebrate the positive aspects of being health at  89,   reviewing the assessment and plan with patient, and post visit ordering and reviewing of  diagnostics and  therapeutics with patient  .   Follow-up: Return in about 6 months (around 07/02/2024).   Sherlene Shams, MD

## 2024-01-03 NOTE — Assessment & Plan Note (Signed)
 Discussed with patient April 19, 2017

## 2024-01-03 NOTE — Assessment & Plan Note (Signed)
 She  completed 5 weeks of physical therapy and has not had a fall in several months

## 2024-01-03 NOTE — Assessment & Plan Note (Signed)
 BP has been difficult to control due to recurrent epsiodes of intolerance of normal readings, which may be due to her faulty BP monitor (discovered today).  She is now taking 1.25 mg amlodipine and 30 mg Imdur daily.   Her recurrent episodes of feeling light headed and sluggish  do not appear to be caused by poor perfusion of brain  based on results of   MRA brain .  Home BP machine is NOT accurate and registering 40 pts higher than office readings.  No changes today

## 2024-01-04 ENCOUNTER — Encounter: Payer: Self-pay | Admitting: Internal Medicine

## 2024-01-04 LAB — B12 AND FOLATE PANEL
Folate: >25.2 ng/mL (ref >5.9)
Vitamin B-12: 785 pg/mL (ref 211–911)

## 2024-01-04 LAB — VITAMIN D 25 HYDROXY (VIT D DEFICIENCY, FRACTURES): VITD: 35.3 ng/mL (ref 30.00–100.00)

## 2024-01-04 LAB — TSH: TSH: 1.89 u[IU]/mL (ref 0.35–5.50)

## 2024-01-11 ENCOUNTER — Ambulatory Visit: Payer: HMO | Admitting: Cardiology

## 2024-01-12 DIAGNOSIS — H5710 Ocular pain, unspecified eye: Secondary | ICD-10-CM | POA: Diagnosis not present

## 2024-01-12 DIAGNOSIS — H4921 Sixth [abducent] nerve palsy, right eye: Secondary | ICD-10-CM | POA: Diagnosis not present

## 2024-01-12 DIAGNOSIS — H16223 Keratoconjunctivitis sicca, not specified as Sjogren's, bilateral: Secondary | ICD-10-CM | POA: Diagnosis not present

## 2024-02-03 ENCOUNTER — Encounter: Payer: Self-pay | Admitting: Cardiology

## 2024-02-03 ENCOUNTER — Ambulatory Visit: Attending: Cardiology | Admitting: Cardiology

## 2024-02-03 VITALS — BP 145/75 | HR 64 | Ht 60.0 in | Wt 98.6 lb

## 2024-02-03 DIAGNOSIS — I10 Essential (primary) hypertension: Secondary | ICD-10-CM

## 2024-02-03 DIAGNOSIS — I493 Ventricular premature depolarization: Secondary | ICD-10-CM | POA: Diagnosis not present

## 2024-02-03 NOTE — Progress Notes (Signed)
 Cardiology Office Note:    Date:  02/03/2024   ID:  [name], DOB Dec 26, 1926, MRN [id number]  PCP:  Sherlene Shams, MD  [facility] Cardiologist:  Debbe Odea, MD  [facility] Electrophysiologist:  None   Referring MD: Sherlene Shams, MD   Chief Complaint  Patient presents with   Follow-up    3 month follow up visit. Patient is doing well on today. Meds reviewed.     History of Present Illness:    [name] is a 89 y.o. female with a hx of hypertension, frequent PVCs, CKD, celiac artery stenosis s/p stenting who presents for follow-up.   Previously seen with fatigue, bradycardia with heart rates in the 50s.  Toprol-XL was stopped, started on amlodipine 2.5 mg daily.  She states symptoms of fatigue have improved, she is able to garden without much symptoms.  Takes amlodipine as prescribed, feels well with no adverse effects.  Prior notes Cardiac monitor 06/2021 frequent PVCs 12.2% burden Echo 06/2021 EF 60 to 65%, impaired relaxation. Lexiscan Myoview 04/2020, no evidence for ischemia, low risk study.   Past Medical History:  Diagnosis Date   Acute COVID-19 06/04/2021   Allergy    Arthritis    Atherosclerosis    Atherosclerosis of abdominal aorta (HCC)    Chronic kidney disease    stage 2   CKD (chronic kidney disease) stage 2, GFR 60-89 ml/min    Collagen vascular disease (HCC)    Compression fracture of thoracic spine, non-traumatic (HCC) 08/19/2015   Seen on plain films October 2016   Diverticulosis 2008   GERD (gastroesophageal reflux disease)    Heart murmur    Hyperlipidemia    Hypertensive urgency 05/04/2022   Lumbar spinal stenosis    Osteoporosis    Ovarian lump    RA (rheumatoid arthritis) (HCC)    Raynaud's disease    Rheumatoid arthritis (HCC)    Spinal stenosis     Past Surgical History:  Procedure Laterality Date   BREAST SURGERY Left 1965   Benign biopsy   CHOLECYSTECTOMY  2005   [place]   COLONOSCOPY   2008   Dr. Ricki Rodriguez   EXCISION OF BREAST BIOPSY Left 1965   benign   EYE SURGERY Bilateral    Cataract Extraction with IOL   KYPHOPLASTY N/A 12/17/2016   Procedure: KYPHOPLASTY;  Surgeon: Kennedy Bucker, MD;  Location: [facility] ORS;  Service: Orthopedics;  Laterality: N/A;  l4   LUMBAR LAMINECTOMY  05/09/2017   L2-L5   VISCERAL ANGIOGRAPHY N/A 08/15/2017   Procedure: VISCERAL ANGIOGRAPHY;  Surgeon: Annice Needy, MD;  Location: [facility] INVASIVE CV LAB;  Service: Cardiovascular;  Laterality: N/A;   VISCERAL ANGIOGRAPHY N/A 10/05/2017   Procedure: VISCERAL ANGIOGRAPHY;  Surgeon: Annice Needy, MD;  Location: [facility] INVASIVE CV LAB;  Service: Cardiovascular;  Laterality: N/A;   VISCERAL ARTERY INTERVENTION N/A 08/15/2017   Procedure: VISCERAL ARTERY INTERVENTION;  Surgeon: Annice Needy, MD;  Location: [facility] INVASIVE CV LAB;  Service: Cardiovascular;  Laterality: N/A;    Current Medications: Current Meds  Medication Sig   acetaminophen (TYLENOL) 500 MG tablet Take 500 mg by mouth every 6 (six) hours as needed for mild pain.   Alfalfa 500 MG TABS Take 500 mg by mouth daily.   amLODipine (NORVASC) 2.5 MG tablet Take 1 tablet (2.5 mg total) by mouth daily.   Ascorbic Acid (VITAMIN C CR) 500 MG CPCR Take 500 mg by mouth daily.   aspirin EC 81 MG tablet  Take 1 tablet (81 mg total) by mouth daily. Swallow whole.   Calcium Carbonate (CALCIUM 600 PO) Take by mouth.   Cholecalciferol (VITAMIN D3 PO) Take 500 Units by mouth daily.   clotrimazole-betamethasone (LOTRISONE) cream Apply topically 2 (two) times daily.   diclofenac sodium (VOLTAREN) 1 % GEL Apply topically as needed.   fluticasone (FLONASE) 50 MCG/ACT nasal spray Place 1 spray into both nostrils daily.   Ginkgo Biloba (GNP GINGKO BILOBA EXTRACT PO) Take 1 tablet by mouth daily.   Glucosamine Sulfate 500 MG CAPS Take 500 mg by mouth daily.   isosorbide mononitrate (IMDUR) 30 MG 24 hr tablet Take 1 tablet (30 mg total) by mouth daily.   Lactobacillus  (PROBIOTIC ACIDOPHILUS PO) Take 1 capsule by mouth daily.    loratadine (CLARITIN) 10 MG tablet Take 10 mg by mouth daily as needed for allergies.   Magnesium 400 MG CAPS Take 400 mg by mouth daily.   Multiple Vitamin (MULTIVITAMIN) capsule Take 1 capsule by mouth daily.   nitroGLYCERIN (NITROSTAT) 0.4 MG SL tablet DISSOLVE ONE TABLET UNDER THE TONGUE EVERY 5 MINUTES AS NEEDED FOR CHEST PAIN.  DO NOT EXCEED A TOTAL OF 3 DOSES IN 15 MINUTES   nystatin cream (MYCOSTATIN) Apply twice daily to corners of lips until redness resolves   Omega-3 Fatty Acids (OMEGA 3 PO) Take 520 mg by mouth daily.    Pramoxine-HC (HYDROCORTISONE ACE-PRAMOXINE) 2.5-1 % CREA Apply topically as needed.    rosuvastatin (CRESTOR) 10 MG tablet Take 1 tablet (10 mg total) by mouth every other day.     Allergies:   Ibandronate, Other, Prednisone, Actonel [risedronate], Amoxicillin, Pantoprazole, Raloxifene, Versed [midazolam], and Doxycycline   Social History   Socioeconomic History   Marital status: Widowed    Spouse name: Chrissie Noa   Number of children: 3   Years of education: some college   Highest education level: Associate degree: academic program  Occupational History    Employer: RETIRED  Tobacco Use   Smoking status: Never   Smokeless tobacco: Never   Tobacco comments:    smoking cessation materials not required  Vaping Use   Vaping status: Never Used  Substance and Sexual Activity   Alcohol use: No    Alcohol/week: 0.0 standard drinks of alcohol   Drug use: No   Sexual activity: Not Currently  Other Topics Concern   Not on file  Social History Narrative   Widowed   3 daughter   Social Drivers of Corporate investment banker Strain: Low Risk  (05/20/2023)   Overall Financial Resource Strain (CARDIA)    Difficulty of Paying Living Expenses: Not hard at all  Food Insecurity: No Food Insecurity (05/20/2023)   Hunger Vital Sign    Worried About Running Out of Food in the Last Year: Never true    Ran  Out of Food in the Last Year: Never true  Transportation Needs: No Transportation Needs (05/20/2023)   PRAPARE - Administrator, Civil Service (Medical): No    Lack of Transportation (Non-Medical): No  Physical Activity: Inactive (05/20/2023)   Exercise Vital Sign    Days of Exercise per Week: 0 days    Minutes of Exercise per Session: 0 min  Stress: No Stress Concern Present (05/20/2023)   [employer] of Occupational Health - Occupational Stress Questionnaire    Feeling of Stress : Only a little  Social Connections: Moderately Isolated (05/20/2023)   Social Connection and Isolation Panel [NHANES]    Frequency of  Communication with Friends and Family: More than three times a week    Frequency of Social Gatherings with Friends and Family: More than three times a week    Attends Religious Services: Never    Database administrator or Organizations: No    Attends Engineer, structural: More than 4 times per year    Marital Status: Widowed     Family History: The patient's family history includes Alcohol abuse in her sister; Cancer in her brother and brother; Diabetes in her brother, maternal grandmother, and sister; Heart disease in her maternal grandmother and mother; Heart disease (age of onset: 24) in her brother; Hypertension in her father; Kidney disease in her sister; Stroke in her brother.  ROS:   Please see the history of present illness.     All other systems reviewed and are negative.  EKGs/Labs/Other Studies Reviewed:    The following studies were reviewed today:   Recent Labs: 09/26/2023: Hemoglobin 14.5; Platelets 201 10/14/2023: ALT 16; BUN 12; Creatinine, Ser 0.61; Potassium 3.9; Sodium 136 01/03/2024: TSH 1.89  Recent Lipid Panel    Component Value Date/Time   CHOL 165 10/14/2023 0724   CHOL 205 (H) 12/19/2015 1422   TRIG 69.0 10/14/2023 0724   HDL 67.10 10/14/2023 0724   HDL 84 12/19/2015 1422   CHOLHDL 2 10/14/2023 0724   VLDL 13.8  10/14/2023 0724   LDLCALC 84 10/14/2023 0724   LDLCALC 63 01/01/2019 1238   LDLDIRECT 96.0 03/10/2023 1214    Physical Exam:    VS:  BP (!) 145/75   Pulse 64   Ht 5' (1.524 m)   Wt 98 lb 9.6 oz (44.7 kg)   SpO2 96%   BMI 19.26 kg/m     Wt Readings from Last 3 Encounters:  02/03/24 98 lb 9.6 oz (44.7 kg)  01/03/24 98 lb 3.2 oz (44.5 kg)  10/17/23 97 lb 6.4 oz (44.2 kg)     GEN:  Well nourished, well developed in no acute distress HEENT: Normal NECK: No JVD; No carotid bruits CARDIAC: RRR, no murmurs, rubs, gallops RESPIRATORY:  Clear to auscultation without rales, wheezing or rhonchi  ABDOMEN: Soft, non-tender, non-distended MUSCULOSKELETAL:  No edema; No deformity  SKIN: Warm and dry NEUROLOGIC:  Alert and oriented x 3 PSYCHIATRIC:  Normal affect   ASSESSMENT:    1. Frequent PVCs   2. Primary hypertension    PLAN:    In order of problems listed above:  Frequent PVCs, palpitations, EF 60 to 65%.  Toprol-XL previously stopped due to symptoms of fatigue, bradycardia.  Rated cardia resolved.  Current heart rate 64.  Continue to monitor off AV nodal agents. Hypertension, BP elevated but reasonable for age, symptoms.  Symptoms of fatigue resolved.  Okay to let BP run high, 140s okay.  Titrate Norvasc if BP stays in the 150s to 160s.  Otherwise continue Norvasc 2.5 mg daily, Imdur 30 mg daily.  Follow-up in 6 months..   Medication Adjustments/Labs and Tests Ordered: Current medicines are reviewed at length with the patient today.  Concerns regarding medicines are outlined above.  No orders of the defined types were placed in this encounter.   No orders of the defined types were placed in this encounter.    Patient Instructions  Medication Instructions:  No changes at this time.   *If you need a refill on your cardiac medications before your next appointment, please call your pharmacy*  Lab Work: None  If you have labs (blood work)  drawn today and your tests  are completely normal, you will receive your results only by: MyChart Message (if you have MyChart) OR A paper copy in the mail If you have any lab test that is abnormal or we need to change your treatment, we will call you to review the results.  Testing/Procedures: None  Follow-Up: At [facility], you and your health needs are our priority.  As part of our continuing mission to provide you with exceptional heart care, our providers are all part of one team.  This team includes your primary Cardiologist (physician) and Advanced Practice Providers or APPs (Physician Assistants and Nurse Practitioners) who all work together to provide you with the care you need, when you need it.  Your next appointment:   6 month(s)  Provider:   You may see Debbe Odea, MD or one of the following Advanced Practice Providers on your designated Care Team:   Nicolasa Ducking, NP Ames Dura, PA-C Eula Listen, PA-C Cadence Fransico Michael, PA-C Charlsie Quest, NP Carlos Levering, NP     Signed, Debbe Odea, MD  02/03/2024 12:04 PM    [facility]

## 2024-02-03 NOTE — Patient Instructions (Signed)
 Medication Instructions:  No changes at this time.   *If you need a refill on your cardiac medications before your next appointment, please call your pharmacy*  Lab Work: None  If you have labs (blood work) drawn today and your tests are completely normal, you will receive your results only by: MyChart Message (if you have MyChart) OR A paper copy in the mail If you have any lab test that is abnormal or we need to change your treatment, we will call you to review the results.  Testing/Procedures: None  Follow-Up: At [facility], you and your health needs are our priority.  As part of our continuing mission to provide you with exceptional heart care, our providers are all part of one team.  This team includes your primary Cardiologist (physician) and Advanced Practice Providers or APPs (Physician Assistants and Nurse Practitioners) who all work together to provide you with the care you need, when you need it.  Your next appointment:   6 month(s)  Provider:   You may see Debbe Odea, MD or one of the following Advanced Practice Providers on your designated Care Team:   Nicolasa Ducking, NP Ames Dura, PA-C Eula Listen, PA-C Cadence [place], PA-C Charlsie Quest, NP Carlos Levering, NP

## 2024-02-07 DIAGNOSIS — M150 Primary generalized (osteo)arthritis: Secondary | ICD-10-CM | POA: Diagnosis not present

## 2024-02-07 DIAGNOSIS — I7300 Raynaud's syndrome without gangrene: Secondary | ICD-10-CM | POA: Diagnosis not present

## 2024-02-07 DIAGNOSIS — R768 Other specified abnormal immunological findings in serum: Secondary | ICD-10-CM | POA: Diagnosis not present

## 2024-03-27 ENCOUNTER — Encounter (INDEPENDENT_AMBULATORY_CARE_PROVIDER_SITE_OTHER): Payer: Self-pay

## 2024-04-16 DIAGNOSIS — Z961 Presence of intraocular lens: Secondary | ICD-10-CM | POA: Diagnosis not present

## 2024-04-25 DIAGNOSIS — L905 Scar conditions and fibrosis of skin: Secondary | ICD-10-CM | POA: Diagnosis not present

## 2024-04-25 DIAGNOSIS — R208 Other disturbances of skin sensation: Secondary | ICD-10-CM | POA: Diagnosis not present

## 2024-04-25 DIAGNOSIS — D485 Neoplasm of uncertain behavior of skin: Secondary | ICD-10-CM | POA: Diagnosis not present

## 2024-04-25 DIAGNOSIS — L570 Actinic keratosis: Secondary | ICD-10-CM | POA: Diagnosis not present

## 2024-04-25 DIAGNOSIS — C44622 Squamous cell carcinoma of skin of right upper limb, including shoulder: Secondary | ICD-10-CM | POA: Diagnosis not present

## 2024-05-22 ENCOUNTER — Ambulatory Visit (INDEPENDENT_AMBULATORY_CARE_PROVIDER_SITE_OTHER): Payer: HMO | Admitting: *Deleted

## 2024-05-22 VITALS — Ht 60.0 in | Wt 100.0 lb

## 2024-05-22 DIAGNOSIS — Z0000 Encounter for general adult medical examination without abnormal findings: Secondary | ICD-10-CM | POA: Diagnosis not present

## 2024-05-22 NOTE — Patient Instructions (Signed)
 [name] , Thank you for taking time out of your busy schedule to complete your Annual Wellness Visit with me. I enjoyed our conversation and look forward to speaking with you again next year. I, as well as your care team,  appreciate your ongoing commitment to your health goals. Please review the following plan we discussed and let me know if I can assist you in the future. Your Game plan/ To Do List    Referrals: If you haven't heard from the office you've been referred to, please reach out to them at the phone provided.  Remember to update your flu and covid vaccines annually Follow up Visits: Next Medicare AWV with our clinical staff: 05/27/25 @ 10:10   Have you seen your provider in the last 6 months (3 months if uncontrolled diabetes)? Yes Next Office Visit with your provider: 07/03/24  Clinician Recommendations:  Aim for 30 minutes of exercise or brisk walking, 6-8 glasses of water, and 5 servings of fruits and vegetables each day.       This is a list of the screening recommended for you and due dates:  Health Maintenance  Topic Date Due   COVID-19 Vaccine (4 - 2024-25 season) 11/07/2024*   Flu Shot  06/08/2024   Medicare Annual Wellness Visit  05/22/2025   DTaP/Tdap/Td vaccine (3 - Td or Tdap) 03/13/2029   Pneumococcal Vaccine for age over 36  Completed   DEXA scan (bone density measurement)  Completed   Hepatitis B Vaccine  Aged Out   HPV Vaccine  Aged Out   Meningitis B Vaccine  Aged Out   Zoster (Shingles) Vaccine  Discontinued  *Topic was postponed. The date shown is not the original due date.    Advanced directives: (Copy Requested) Please bring a copy of your health care power of attorney and living will to the office to be added to your chart at your convenience. You can mail to [facility] [address] [place], [place] [address] or email to [email or web address] Advance Care Planning is important because it:  [x]  Makes sure you receive the  medical care that is consistent with your values, goals, and preferences  [x]  It provides guidance to your family and loved ones and reduces their decisional burden about whether or not they are making the right decisions based on your wishes.

## 2024-05-22 NOTE — Progress Notes (Signed)
 Subjective:   [name] is a 89 y.o. who presents for a Medicare Wellness preventive visit.  As a reminder, Annual Wellness Visits don't include a physical exam, and some assessments may be limited, especially if this visit is performed virtually. We may recommend an in-person follow-up visit with your provider if needed.  Visit Complete: Virtual I connected with  [name] on 05/22/24 by a audio enabled telemedicine application and verified that I am speaking with the correct person using two identifiers.  Patient Location: Home  Provider Location: Home Office  I discussed the limitations of evaluation and management by telemedicine. The patient expressed understanding and agreed to proceed.  Vital Signs: Because this visit was a virtual/telehealth visit, some criteria may be missing or patient reported. Any vitals not documented were not able to be obtained and vitals that have been documented are patient reported.  VideoDeclined- This patient declined Librarian, academic. Therefore the visit was completed with audio only.  Persons Participating in Visit: Patient.  AWV Questionnaire: No: Patient Medicare AWV questionnaire was not completed prior to this visit.  Cardiac Risk Factors include: advanced age (>64men, >69 women);dyslipidemia;hypertension     Objective:    Today's Vitals   05/22/24 1013 05/22/24 1015  Weight: 100 lb (45.4 kg)   Height: 5' (1.524 m)   PainSc:  3    Body mass index is 19.53 kg/m.     05/22/2024   10:34 AM 09/26/2023    9:28 AM 06/28/2023    6:00 PM 05/20/2023   10:07 AM 06/15/2022    1:19 PM 05/17/2022   10:52 AM 05/04/2022    1:25 PM  Advanced Directives  Does Patient Have a Medical Advance Directive? Yes No No;Yes Yes Yes Yes   Type of Estate agent of [place];Living will  Healthcare Power of [place];Living will Healthcare Power of [place];Living will Healthcare Power of  [place];Living will Healthcare Power of [place];Living will   Does patient want to make changes to medical advance directive?    No - Patient declined No - Patient declined No - Patient declined   Copy of Healthcare Power of Attorney in Chart? No - copy requested   Yes - validated most recent copy scanned in chart (See row information) No - copy requested Yes - validated most recent copy scanned in chart (See row information)   Would patient like information on creating a medical advance directive?  No - Patient declined     No - Patient declined    Current Medications (verified) Outpatient Encounter Medications as of 05/22/2024  Medication Sig   acetaminophen  (TYLENOL ) 500 MG tablet Take 500 mg by mouth every 6 (six) hours as needed for mild pain.   Alfalfa 500 MG TABS Take 500 mg by mouth daily.   amLODipine  (NORVASC ) 2.5 MG tablet Take 1 tablet (2.5 mg total) by mouth daily.   Ascorbic Acid (VITAMIN C CR) 500 MG CPCR Take 500 mg by mouth daily.   aspirin  EC 81 MG tablet Take 1 tablet (81 mg total) by mouth daily. Swallow whole.   Calcium  Carbonate (CALCIUM  600 PO) Take by mouth.   Cholecalciferol  (VITAMIN D3 PO) Take 500 Units by mouth daily.   clotrimazole -betamethasone  (LOTRISONE ) cream Apply topically 2 (two) times daily.   diclofenac sodium (VOLTAREN) 1 % GEL Apply topically as needed.   fluticasone  (FLONASE ) 50 MCG/ACT nasal spray Place 1 spray into both nostrils daily.   Ginkgo Biloba (GNP GINGKO BILOBA EXTRACT PO)  Take 1 tablet by mouth daily.   Glucosamine Sulfate 500 MG CAPS Take 500 mg by mouth daily.   isosorbide  mononitrate (IMDUR ) 30 MG 24 hr tablet Take 1 tablet (30 mg total) by mouth daily.   Lactobacillus (PROBIOTIC ACIDOPHILUS PO) Take 1 capsule by mouth daily.    loratadine (CLARITIN) 10 MG tablet Take 10 mg by mouth daily as needed for allergies.   Magnesium 400 MG CAPS Take 400 mg by mouth daily.   Multiple Vitamin (MULTIVITAMIN) capsule Take 1 capsule by mouth daily.    nystatin  cream (MYCOSTATIN ) Apply twice daily to corners of lips until redness resolves   Omega-3 Fatty Acids (OMEGA 3 PO) Take 520 mg by mouth daily.    Pramoxine-HC (HYDROCORTISONE ACE-PRAMOXINE) 2.5-1 % CREA Apply topically as needed.    rosuvastatin  (CRESTOR ) 10 MG tablet Take 1 tablet (10 mg total) by mouth every other day.   nitroGLYCERIN  (NITROSTAT ) 0.4 MG SL tablet DISSOLVE ONE TABLET UNDER THE TONGUE EVERY 5 MINUTES AS NEEDED FOR CHEST PAIN.  DO NOT EXCEED A TOTAL OF 3 DOSES IN 15 MINUTES (Patient not taking: Reported on 05/22/2024)   [DISCONTINUED] mupirocin  ointment (BACTROBAN ) 2 % Apply to right leg wound daily - a small dab as needed once a day.   No facility-administered encounter medications on file as of 05/22/2024.    Allergies (verified) Ibandronate, Other, Prednisone, Actonel [risedronate], Amoxicillin , Pantoprazole , Raloxifene, Versed  [midazolam ], and Doxycycline   History: Past Medical History:  Diagnosis Date   Acute COVID-19 06/04/2021   Allergy    Arthritis    Atherosclerosis    Atherosclerosis of abdominal aorta (HCC)    Chronic kidney disease    stage 2   CKD (chronic kidney disease) stage 2, GFR 60-89 ml/min    Collagen vascular disease (HCC)    Compression fracture of thoracic spine, non-traumatic (HCC) 08/19/2015   Seen on plain films October 2016   Diverticulosis 2008   GERD (gastroesophageal reflux disease)    Heart murmur    Hyperlipidemia    Hypertensive urgency 05/04/2022   Lumbar spinal stenosis    Osteoporosis    Ovarian lump    RA (rheumatoid arthritis) (HCC)    Raynaud's disease    Rheumatoid arthritis (HCC)    Spinal stenosis    Past Surgical History:  Procedure Laterality Date   BREAST SURGERY Left 1965   Benign biopsy   CHOLECYSTECTOMY  2005   [place]   COLONOSCOPY  2008   Dr. Luellen   EXCISION OF BREAST BIOPSY Left 1965   benign   EYE SURGERY Bilateral    Cataract Extraction with IOL   KYPHOPLASTY N/A 12/17/2016    Procedure: KYPHOPLASTY;  Surgeon: Ozell Flake, MD;  Location: [facility] ORS;  Service: Orthopedics;  Laterality: N/A;  l4   LUMBAR LAMINECTOMY  05/09/2017   L2-L5   VISCERAL ANGIOGRAPHY N/A 08/15/2017   Procedure: VISCERAL ANGIOGRAPHY;  Surgeon: Marea Selinda RAMAN, MD;  Location: [facility] INVASIVE CV LAB;  Service: Cardiovascular;  Laterality: N/A;   VISCERAL ANGIOGRAPHY N/A 10/05/2017   Procedure: VISCERAL ANGIOGRAPHY;  Surgeon: Marea Selinda RAMAN, MD;  Location: [facility];  Service: Cardiovascular;  Laterality: N/A;   VISCERAL ARTERY INTERVENTION N/A 08/15/2017   Procedure: VISCERAL ARTERY INTERVENTION;  Surgeon: Marea Selinda RAMAN, MD;  Location: [facility];  Service: Cardiovascular;  Laterality: N/A;   Family History  Problem Relation Age of Onset   Heart disease Mother    Hypertension Father    Heart disease Brother 82  heart attack   Cancer Brother        bladder   Diabetes Sister    Alcohol abuse Sister    Kidney disease Sister    Diabetes Brother    Stroke Brother    Diabetes Maternal Grandmother    Heart disease Maternal Grandmother    Cancer Brother        lung   Social History   Socioeconomic History   Marital status: Widowed    Spouse name: Elsie   Number of children: 3   Years of education: some college   Highest education level: Associate degree: academic program  Occupational History    Employer: RETIRED  Tobacco Use   Smoking status: Never   Smokeless tobacco: Never   Tobacco comments:    smoking cessation materials not required  Vaping Use   Vaping status: Never Used  Substance and Sexual Activity   Alcohol use: No    Alcohol/week: 0.0 standard drinks of alcohol   Drug use: No   Sexual activity: Not Currently  Other Topics Concern   Not on file  Social History Narrative   Widowed   3 daughter   Social Drivers of Corporate investment banker Strain: Low Risk  (05/22/2024)   Overall Financial Resource Strain (CARDIA)    Difficulty of Paying  Living Expenses: Not hard at all  Food Insecurity: No Food Insecurity (05/22/2024)   Hunger Vital Sign    Worried About Running Out of Food in the Last Year: Never true    Ran Out of Food in the Last Year: Never true  Transportation Needs: No Transportation Needs (05/22/2024)   PRAPARE - Administrator, Civil Service (Medical): No    Lack of Transportation (Non-Medical): No  Physical Activity: Inactive (05/22/2024)   Exercise Vital Sign    Days of Exercise per Week: 0 days    Minutes of Exercise per Session: 0 min  Stress: No Stress Concern Present (05/22/2024)   [employer] of Occupational Health - Occupational Stress Questionnaire    Feeling of Stress: Only a little  Social Connections: Moderately Isolated (05/22/2024)   Social Connection and Isolation Panel    Frequency of Communication with Friends and Family: More than three times a week    Frequency of Social Gatherings with Friends and Family: More than three times a week    Attends Religious Services: More than 4 times per year    Active Member of [employer] or Organizations: No    Attends Banker Meetings: Never    Marital Status: Widowed    Tobacco Counseling Counseling given: Not Answered Tobacco comments: smoking cessation materials not required    Clinical Intake:  Pre-visit preparation completed: Yes  Pain : 0-10 Pain Score: 3  Pain Type: Chronic pain Pain Location: Abdomen Pain Orientation: Right Pain Descriptors / Indicators: Dull Pain Onset: More than a month ago Pain Frequency: Intermittent     BMI - recorded: 19.53 Nutritional Status: BMI of 19-24  Normal Nutritional Risks: None Diabetes: No  Lab Results  Component Value Date   HGBA1C 7.0 (H) 11/16/2021   HGBA1C 6.1 (H) 05/14/2019   HGBA1C 6.2 (H) 01/01/2019     How often do you need to have someone help you when you read instructions, pamphlets, or other written materials from your doctor or pharmacy?: 1 -  Never  Interpreter Needed?: No  Information entered by :: R. Anthea Udovich LPN   Activities of Daily Living  05/22/2024   10:18 AM  In your present state of health, do you have any difficulty performing the following activities:  Hearing? 1  Comment wears aids  Vision? 0  Comment glasses  Difficulty concentrating or making decisions? 1  Walking or climbing stairs? 0  Dressing or bathing? 0  Doing errands, shopping? 1  Preparing Food and eating ? N  Using the Toilet? N  In the past six months, have you accidently leaked urine? Y  Do you have problems with loss of bowel control? N  Managing your Medications? N  Managing your Finances? N  Housekeeping or managing your Housekeeping? N    Patient Care Team: Marylynn Verneita CROME, MD as PCP - General (Internal Medicine) Darliss Rogue, MD as PCP - Cardiology (Cardiology) Dingeldein, Elspeth, MD (Ophthalmology) Blair Mt, MD as Referring Physician (Otolaryngology) Hester Wolm PARAS, MD as Consulting Physician (Cardiology) Isenstein, Arin L, MD as Consulting Physician (Dermatology) Marea Selinda RAMAN, MD as Consulting Physician (Vascular Surgery)  I have updated your Care Teams any recent Medical Services you may have received from other providers in the past year.     Assessment:   This is a routine wellness examination for [name].  Hearing/Vision screen Hearing Screening - Comments:: Wear aids Vision Screening - Comments:: glasses   Goals Addressed             This Visit's Progress    Patient Stated       Wants to visit neighbors more       Depression Screen     05/22/2024   10:28 AM 01/03/2024    2:08 PM 10/17/2023    1:37 PM 06/14/2023    2:39 PM 05/20/2023   10:00 AM 03/29/2023   11:00 AM 03/10/2023   11:22 AM  PHQ 2/9 Scores  PHQ - 2 Score 0 0 0 0 0 0 0  PHQ- 9 Score 2   0 2      Fall Risk    05/22/2024   10:21 AM 01/03/2024    2:08 PM 10/17/2023    1:37 PM 06/14/2023    2:39 PM 05/20/2023   10:36 AM  Fall Risk    Falls in the past year? 1 1 1 1 1   Number falls in past yr: 0 0 1 1 1   Injury with Fall? 0 0 1 1 0  Comment     no serious injury, did go and have x-rays  Risk for fall due to : History of fall(s);Impaired balance/gait History of fall(s) History of fall(s) History of fall(s) No Fall Risks;History of fall(s);Medication side effect;Impaired balance/gait  Follow up Falls evaluation completed;Falls prevention discussed Falls evaluation completed Falls evaluation completed Falls evaluation completed Falls prevention discussed;Falls evaluation completed    MEDICARE RISK AT HOME:  Medicare Risk at Home Any stairs in or around the home?: Yes If so, are there any without handrails?: No Home free of loose throw rugs in walkways, pet beds, electrical cords, etc?: Yes Adequate lighting in your home to reduce risk of falls?: Yes Life alert?: Yes Use of a cane, walker or w/c?: Yes Grab bars in the bathroom?: Yes Shower chair or bench in shower?: Yes Elevated toilet seat or a handicapped toilet?: Yes  TIMED UP AND GO:  Was the test performed?  No  Cognitive Function: 6CIT completed        05/22/2024   10:35 AM 05/20/2023   10:07 AM 05/05/2020    9:59 AM 05/03/2019    2:29 PM 04/28/2018  2:28 PM  6CIT Screen  What Year? 0 points 0 points 0 points 0 points 0 points  What month? 0 points 0 points 0 points 0 points 0 points  What time? 0 points 0 points  0 points 0 points  Count back from 20 0 points 0 points  0 points 0 points  Months in reverse 0 points 0 points 0 points 0 points 0 points  Repeat phrase 0 points 2 points  0 points 2 points  Total Score 0 points 2 points  0 points 2 points    Immunizations Immunization History  Administered Date(s) Administered   Fluad Quad(high Dose 65+) 08/07/2019, 08/26/2020   Influenza, High Dose Seasonal PF 09/12/2018   Influenza,inj,Quad PF,6+ Mos 08/15/2015   Influenza-Unspecified 08/20/2011, 08/11/2012, 09/08/2013, 09/05/2014, 08/15/2015,  09/13/2016, 08/25/2017, 08/08/2021, 08/22/2022, 08/24/2023   PFIZER(Purple Top)SARS-COV-2 Vaccination 12/04/2019, 12/25/2019, 10/17/2020   Pneumococcal Conjugate-13 12/13/2014   Pneumococcal Polysaccharide-23 11/08/2000, 08/08/2006   Tdap 11/08/2008, 03/14/2019   Zoster, Live 01/07/2004, 11/08/2008    Screening Tests Health Maintenance  Topic Date Due   Medicare Annual Wellness (AWV)  05/19/2024   COVID-19 Vaccine (4 - 2024-25 season) 11/07/2024 (Originally 07/10/2023)   INFLUENZA VACCINE  06/08/2024   DTaP/Tdap/Td (3 - Td or Tdap) 03/13/2029   Pneumococcal Vaccine: 50+ Years  Completed   DEXA SCAN  Completed   Hepatitis B Vaccines  Aged Out   HPV VACCINES  Aged Out   Meningococcal B Vaccine  Aged Out   Zoster Vaccines- Shingrix  Discontinued    Health Maintenance  Health Maintenance Due  Topic Date Due   Medicare Annual Wellness (AWV)  05/19/2024   Health Maintenance Items Addressed: Discussed the need to update flu and covid vaccines annually. Patient declines shingles vaccine and Dexa.  Additional Screening:  Vision Screening: Recommended annual ophthalmology exams for early detection of glaucoma and other disorders of the eye. Up to date  [facility] Would you like a referral to an eye doctor? No    Dental Screening: Recommended annual dental exams for proper oral hygiene  Community Resource Referral / Chronic Care Management: CRR required this visit?  No   CCM required this visit?  No   Plan:    I have personally reviewed and noted the following in the patient's chart:   Medical and social history Use of alcohol, tobacco or illicit drugs  Current medications and supplements including opioid prescriptions. Patient is not currently taking opioid prescriptions. Functional ability and status Nutritional status Physical activity Advanced directives List of other physicians Hospitalizations, surgeries, and ER visits in previous 12 months Vitals Screenings to  include cognitive, depression, and falls Referrals and appointments  In addition, I have reviewed and discussed with patient certain preventive protocols, quality metrics, and best practice recommendations. A written personalized care plan for preventive services as well as general preventive health recommendations were provided to patient.   Angeline Fredericks, LPN   [id number]/84/7974   After Visit Summary: (MyChart) Due to this being a telephonic visit, the after visit summary with patients personalized plan was offered to patient via MyChart   Notes: Nothing significant to report at this time.

## 2024-05-23 DIAGNOSIS — E785 Hyperlipidemia, unspecified: Secondary | ICD-10-CM | POA: Diagnosis not present

## 2024-05-23 DIAGNOSIS — Z7982 Long term (current) use of aspirin: Secondary | ICD-10-CM | POA: Diagnosis not present

## 2024-05-23 DIAGNOSIS — I10 Essential (primary) hypertension: Secondary | ICD-10-CM | POA: Diagnosis not present

## 2024-06-19 DIAGNOSIS — H0011 Chalazion right upper eyelid: Secondary | ICD-10-CM | POA: Diagnosis not present

## 2024-06-26 ENCOUNTER — Other Ambulatory Visit (INDEPENDENT_AMBULATORY_CARE_PROVIDER_SITE_OTHER): Payer: Self-pay | Admitting: Vascular Surgery

## 2024-06-26 DIAGNOSIS — I774 Celiac artery compression syndrome: Secondary | ICD-10-CM

## 2024-06-29 ENCOUNTER — Ambulatory Visit (INDEPENDENT_AMBULATORY_CARE_PROVIDER_SITE_OTHER): Payer: PRIVATE HEALTH INSURANCE | Admitting: Vascular Surgery

## 2024-06-29 ENCOUNTER — Encounter (INDEPENDENT_AMBULATORY_CARE_PROVIDER_SITE_OTHER): Payer: PRIVATE HEALTH INSURANCE

## 2024-07-03 ENCOUNTER — Encounter: Payer: Self-pay | Admitting: Internal Medicine

## 2024-07-03 ENCOUNTER — Ambulatory Visit (INDEPENDENT_AMBULATORY_CARE_PROVIDER_SITE_OTHER): Payer: HMO | Admitting: Internal Medicine

## 2024-07-03 ENCOUNTER — Ambulatory Visit

## 2024-07-03 VITALS — BP 122/62 | HR 63 | Temp 97.5°F | Ht 60.0 in | Wt 96.8 lb

## 2024-07-03 DIAGNOSIS — I8393 Asymptomatic varicose veins of bilateral lower extremities: Secondary | ICD-10-CM | POA: Diagnosis not present

## 2024-07-03 DIAGNOSIS — R918 Other nonspecific abnormal finding of lung field: Secondary | ICD-10-CM

## 2024-07-03 DIAGNOSIS — E782 Mixed hyperlipidemia: Secondary | ICD-10-CM

## 2024-07-03 DIAGNOSIS — R4589 Other symptoms and signs involving emotional state: Secondary | ICD-10-CM

## 2024-07-03 DIAGNOSIS — R61 Generalized hyperhidrosis: Secondary | ICD-10-CM

## 2024-07-03 DIAGNOSIS — Z87898 Personal history of other specified conditions: Secondary | ICD-10-CM

## 2024-07-03 DIAGNOSIS — Z789 Other specified health status: Secondary | ICD-10-CM | POA: Insufficient documentation

## 2024-07-03 DIAGNOSIS — I774 Celiac artery compression syndrome: Secondary | ICD-10-CM

## 2024-07-03 DIAGNOSIS — R1903 Right lower quadrant abdominal swelling, mass and lump: Secondary | ICD-10-CM | POA: Diagnosis not present

## 2024-07-03 DIAGNOSIS — R140 Abdominal distension (gaseous): Secondary | ICD-10-CM

## 2024-07-03 LAB — URINALYSIS, ROUTINE W REFLEX MICROSCOPIC
Bilirubin Urine: NEGATIVE
Hgb urine dipstick: NEGATIVE
Ketones, ur: NEGATIVE
Leukocytes,Ua: NEGATIVE
Nitrite: NEGATIVE
RBC / HPF: NONE SEEN (ref 0–?)
Specific Gravity, Urine: 1.01 (ref 1.000–1.030)
Total Protein, Urine: NEGATIVE
Urine Glucose: NEGATIVE
Urobilinogen, UA: 0.2 (ref 0.0–1.0)
WBC, UA: NONE SEEN (ref 0–?)
pH: 6 (ref 5.0–8.0)

## 2024-07-03 MED ORDER — TRIAMCINOLONE ACETONIDE 0.1 % EX CREA: 1.0000 | TOPICAL_CREAM | Freq: Two times a day (BID) | CUTANEOUS | 0 refills | Status: AC

## 2024-07-03 NOTE — Patient Instructions (Signed)
 We are ruling out serious causes of your night sweats (lymphoma,  ovarian CA) before we attribute them to menopause  Steroid cream for your itchy  bug bite: use it twice daily until resolved   Tell Dr Marea about your leg pain at your upcoming visit   The foot stiffness sounds like osteoarthritis since it improves with activity and is brought on by rest (same for the leg pain)

## 2024-07-03 NOTE — Assessment & Plan Note (Signed)
 Received from patient and sent to scanner

## 2024-07-03 NOTE — Progress Notes (Unsigned)
 Subjective:  Patient ID: [name], female    DOB: [birth year]  Age: 89 y.o. MRN: [id number]  CC: There were no encounter diagnoses.   HPI [name] presents for  Chief Complaint  Patient presents with   Medical Management of Chronic Issues    6 mo f/u    I'm going downhill pretty fast   1) HTN:  Patient is taking her medications as prescribed and notes no adverse effects.  Home BP readings have been done about once per week and are  generally < 130/80 .  She is avoiding added salt in her diet and walking regularly about 3 times per week for exercise  .   2) worried about her circulation due to pain in legs at rest  but not with activity.  Has  extensive varicose veins and a  history of Celiac artery stenosis with prior stenting..    Reviewed last mesenteric study August 2024: Normal Celiac artery , Superior Mesenteric artery, Splenic artery and Hepatic . Scheduled ot see dr Marea in September    3) new onset night sweats started 2 weeks ago with intermittent sweats, now having them every night with 2 nights that  drenched her pajamas twice  thus far.  4(  itchy bug bote on right innner nee   5) Resolving chalazion on right eyelid.   Prescribed cephalexin  by [facility] . Competed today.  No diarrhea.  Taking a probiotic .   Outpatient Medications Prior to Visit  Medication Sig Dispense Refill   acetaminophen  (TYLENOL ) 500 MG tablet Take 500 mg by mouth every 6 (six) hours as needed for mild pain.     Alfalfa 500 MG TABS Take 500 mg by mouth daily.     amLODipine  (NORVASC ) 2.5 MG tablet Take 1 tablet (2.5 mg total) by mouth daily. 90 tablet 3   Ascorbic Acid (VITAMIN C CR) 500 MG CPCR Take 500 mg by mouth daily.     aspirin  EC 81 MG tablet Take 1 tablet (81 mg total) by mouth daily. Swallow whole. 90 tablet 2   Calcium  Carbonate (CALCIUM  600 PO) Take by mouth.     Cholecalciferol  (VITAMIN D3 PO) Take 500 Units by mouth daily.     clotrimazole -betamethasone   (LOTRISONE ) cream Apply topically 2 (two) times daily. 30 g 1   diclofenac sodium (VOLTAREN) 1 % GEL Apply topically as needed.     fluticasone  (FLONASE ) 50 MCG/ACT nasal spray Place 1 spray into both nostrils daily.     Ginkgo Biloba (GNP GINGKO BILOBA EXTRACT PO) Take 1 tablet by mouth daily.     Glucosamine Sulfate 500 MG CAPS Take 500 mg by mouth daily.     isosorbide  mononitrate (IMDUR ) 30 MG 24 hr tablet Take 1 tablet (30 mg total) by mouth daily. 90 tablet 1   Lactobacillus (PROBIOTIC ACIDOPHILUS PO) Take 1 capsule by mouth daily.      loratadine (CLARITIN) 10 MG tablet Take 10 mg by mouth daily as needed for allergies.     Magnesium 400 MG CAPS Take 400 mg by mouth daily.     Multiple Vitamin (MULTIVITAMIN) capsule Take 1 capsule by mouth daily.     nystatin  cream (MYCOSTATIN ) Apply twice daily to corners of lips until redness resolves 15 g 2   Omega-3 Fatty Acids (OMEGA 3 PO) Take 520 mg by mouth daily.      Pramoxine-HC (HYDROCORTISONE ACE-PRAMOXINE) 2.5-1 % CREA Apply topically as needed.      rosuvastatin  (CRESTOR )  10 MG tablet Take 1 tablet (10 mg total) by mouth every other day. 45 tablet 1   nitroGLYCERIN  (NITROSTAT ) 0.4 MG SL tablet DISSOLVE ONE TABLET UNDER THE TONGUE EVERY 5 MINUTES AS NEEDED FOR CHEST PAIN.  DO NOT EXCEED A TOTAL OF 3 DOSES IN 15 MINUTES (Patient not taking: Reported on 05/22/2024) 50 tablet 0   No facility-administered medications prior to visit.    Review of Systems;  Patient denies headache, fevers, malaise, unintentional weight loss, skin rash, eye pain, sinus congestion and sinus pain, sore throat, dysphagia,  hemoptysis , cough, dyspnea, wheezing, chest pain, palpitations, orthopnea, edema, abdominal pain, nausea, melena, diarrhea, constipation, flank pain, dysuria, hematuria, urinary  Frequency, nocturia, numbness, tingling, seizures,  Focal weakness, Loss of consciousness,  Tremor, insomnia, depression, anxiety, and suicidal ideation.       Objective:  BP 122/62 (BP Location: Left Arm, Patient Position: Sitting, Cuff Size: Normal)   Pulse 63   Temp (!) 97.5 F (36.4 C) (Oral)   Ht 5' (1.524 m)   Wt 96 lb 12.8 oz (43.9 kg)   SpO2 97%   BMI 18.90 kg/m   BP Readings from Last 3 Encounters:  07/03/24 122/62  02/03/24 (!) 145/75  01/03/24 (!) 150/82    Wt Readings from Last 3 Encounters:  07/03/24 96 lb 12.8 oz (43.9 kg)  05/22/24 100 lb (45.4 kg)  02/03/24 98 lb 9.6 oz (44.7 kg)    Physical Exam  Lab Results  Component Value Date   HGBA1C 7.0 (H) 11/16/2021   HGBA1C 6.1 (H) 05/14/2019   HGBA1C 6.2 (H) 01/01/2019    Lab Results  Component Value Date   CREATININE 0.61 10/14/2023   CREATININE 0.46 09/26/2023   CREATININE 0.71 04/19/2023    Lab Results  Component Value Date   WBC 4.2 09/26/2023   HGB 14.5 09/26/2023   HCT 43.6 09/26/2023   PLT 201 09/26/2023   GLUCOSE 140 (H) 10/14/2023   CHOL 165 10/14/2023   TRIG 69.0 10/14/2023   HDL 67.10 10/14/2023   LDLDIRECT 96.0 03/10/2023   LDLCALC 84 10/14/2023   ALT 16 10/14/2023   AST 31 10/14/2023   NA 136 10/14/2023   K 3.9 10/14/2023   CL 99 10/14/2023   CREATININE 0.61 10/14/2023   BUN 12 10/14/2023   CO2 31 10/14/2023   TSH 1.89 01/03/2024   HGBA1C 7.0 (H) 11/16/2021    CT Chest W Contrast Result Date: 10/16/2023 CLINICAL DATA:  Non-small cell lung cancer. Assess treatment response. * Tracking Code: BO * EXAM: CT CHEST WITH CONTRAST TECHNIQUE: Multidetector CT imaging of the chest was performed during intravenous contrast administration. RADIATION DOSE REDUCTION: This exam was performed according to the departmental dose-optimization program which includes automated exposure control, adjustment of the mA and/or kV according to patient size and/or use of iterative reconstruction technique. CONTRAST:  60mL OMNIPAQUE  IOHEXOL  300 MG/ML  SOLN COMPARISON: 03/14/2023 FINDINGS: Cardiovascular: The heart size appears within normal limits. Aortic  atherosclerosis. Coronary artery atherosclerotic calcifications. No pericardial effusion. Mediastinum/Nodes: Thyroid  gland, trachea, and esophagus are unremarkable. Right paratracheal lymph nodes are again noted and appear unchanged. These measure up to 8 mm short axis, image 57/2 and image 48/2. No enlarged hilar lymph nodes. No axillary adenopathy. Lungs/Pleura: No pleural effusion, airspace consolidation, atelectasis or pneumothorax. Unchanged appearance residual nodularity and ground-glass in the apical segment of the right upper lobe mild cylindrical bronchiectasis. Scattered areas of mucoid impaction. No suspicious pulmonary nodule. No pleural effusion or airspace consolidation. Upper Abdomen: No  acute findings. Left adrenal nodule is stable measuring 1.5 by 1.4 cm, image 138/2. Unchanged cyst within segment 4 measuring 1.1 cm, image 163/2. Previous cholecystectomy. Musculoskeletal: Degenerative changes noted within the spine. Osteopenia. L2 compression deformity is stable from previous exam. No acute or suspicious osseous findings. IMPRESSION: 1. Stable exam. No signs of recurrent or metastatic disease. 2. Unchanged appearance of residual nodularity and ground-glass in the apical segment of the right upper lobe. 3. Coronary artery calcifications. 4. Stable left adrenal adenoma. 5.  Aortic Atherosclerosis (ICD10-I70.0). Electronically Signed   By: Waddell Calk M.D.   On: 10/16/2023 08:37    Assessment & Plan:  .There are no diagnoses linked to this encounter.   I spent 34 minutes on the day of this face to face encounter reviewing patient's  most recent visit with cardiology,  nephrology,  and neurology,  prior relevant surgical and non surgical procedures, recent  labs and imaging studies, counseling on weight management,  reviewing the assessment and plan with patient, and post visit ordering and reviewing of  diagnostics and therapeutics with patient  .   Follow-up: No follow-ups on  file.   Verneita LITTIE Kettering, MD

## 2024-07-04 DIAGNOSIS — R1903 Right lower quadrant abdominal swelling, mass and lump: Secondary | ICD-10-CM | POA: Insufficient documentation

## 2024-07-04 DIAGNOSIS — Z87898 Personal history of other specified conditions: Secondary | ICD-10-CM | POA: Insufficient documentation

## 2024-07-04 DIAGNOSIS — R61 Generalized hyperhidrosis: Secondary | ICD-10-CM | POA: Insufficient documentation

## 2024-07-04 LAB — COMPREHENSIVE METABOLIC PANEL WITH GFR
ALT: 15 U/L (ref 0–35)
AST: 29 U/L (ref 0–37)
Albumin: 4 g/dL (ref 3.5–5.2)
Alkaline Phosphatase: 46 U/L (ref 39–117)
BUN: 16 mg/dL (ref 6–23)
CO2: 32 meq/L (ref 19–32)
Calcium: 9.4 mg/dL (ref 8.4–10.5)
Chloride: 98 meq/L (ref 96–112)
Creatinine, Ser: 0.56 mg/dL (ref 0.40–1.20)
GFR: 77.97 mL/min (ref >60.00)
Glucose, Bld: 120 mg/dL — ABNORMAL HIGH (ref 70–99)
Potassium: 4 meq/L (ref 3.5–5.1)
Sodium: 140 meq/L (ref 135–145)
Total Bilirubin: 0.3 mg/dL (ref 0.2–1.2)
Total Protein: 6.7 g/dL (ref 6.0–8.3)

## 2024-07-04 LAB — CBC WITH DIFFERENTIAL/PLATELET
Basophils Absolute: 0.1 K/uL (ref 0.0–0.1)
Basophils Relative: 1.2 % (ref 0.0–3.0)
Eosinophils Absolute: 0.1 K/uL (ref 0.0–0.7)
Eosinophils Relative: 1.8 % (ref 0.0–5.0)
HCT: 41.7 % (ref 36.0–46.0)
Hemoglobin: 13.4 g/dL (ref 12.0–15.0)
Lymphocytes Relative: 29.5 % (ref 12.0–46.0)
Lymphs Abs: 1.4 K/uL (ref 0.7–4.0)
MCHC: 32.1 g/dL (ref 30.0–36.0)
MCV: 91.4 fl (ref 78.0–100.0)
Monocytes Absolute: 0.5 K/uL (ref 0.1–1.0)
Monocytes Relative: 9.7 % (ref 3.0–12.0)
Neutro Abs: 2.8 K/uL (ref 1.4–7.7)
Neutrophils Relative %: 57.8 % (ref 43.0–77.0)
Platelets: 235 K/uL (ref 150.0–400.0)
RBC: 4.56 Mil/uL (ref 3.87–5.11)
RDW: 12.8 % (ref 11.5–15.5)
WBC: 4.8 K/uL (ref 4.0–10.5)

## 2024-07-04 NOTE — Assessment & Plan Note (Addendum)
 She leg pain at night that is superficial . Encouraged to use compression stockings and  aspercreme

## 2024-07-04 NOTE — Assessment & Plan Note (Signed)
 Patient by surveillance ultrasounds.

## 2024-07-04 NOTE — Assessment & Plan Note (Signed)
 New onset,  etiology unclear  CBC, chest x ray , quantiferon gold, ad CA 125 ordered

## 2024-07-04 NOTE — Assessment & Plan Note (Signed)
 Faith based counselling given today,  which comprised > 50% of the time spent with patient and family

## 2024-07-05 DIAGNOSIS — L905 Scar conditions and fibrosis of skin: Secondary | ICD-10-CM | POA: Diagnosis not present

## 2024-07-05 DIAGNOSIS — C44622 Squamous cell carcinoma of skin of right upper limb, including shoulder: Secondary | ICD-10-CM | POA: Diagnosis not present

## 2024-07-05 LAB — LIPID PANEL W/REFLEX DIRECT LDL
Cholesterol: 165 mg/dL (ref <200)
HDL: 71 mg/dL (ref >=50)
LDL Cholesterol (Calc): 73 mg/dL
Non-HDL Cholesterol (Calc): 94 mg/dL (ref <130)
Total CHOL/HDL Ratio: 2.3 (calc) (ref <5.0)
Triglycerides: 124 mg/dL (ref <150)

## 2024-07-05 LAB — CA 125: CA 125: 16 U/mL (ref <35)

## 2024-07-05 LAB — QUANTIFERON-TB GOLD PLUS
Mitogen-NIL: 6.5 [IU]/mL
NIL: 0.04 [IU]/mL
QuantiFERON-TB Gold Plus: NEGATIVE
TB1-NIL: 0 [IU]/mL
TB2-NIL: 0 [IU]/mL

## 2024-07-07 ENCOUNTER — Ambulatory Visit: Payer: Self-pay | Admitting: Internal Medicine

## 2024-07-12 ENCOUNTER — Other Ambulatory Visit (INDEPENDENT_AMBULATORY_CARE_PROVIDER_SITE_OTHER): Payer: Self-pay | Admitting: Vascular Surgery

## 2024-07-12 DIAGNOSIS — M79604 Pain in right leg: Secondary | ICD-10-CM

## 2024-07-13 ENCOUNTER — Ambulatory Visit (INDEPENDENT_AMBULATORY_CARE_PROVIDER_SITE_OTHER): Payer: HMO

## 2024-07-13 ENCOUNTER — Encounter (INDEPENDENT_AMBULATORY_CARE_PROVIDER_SITE_OTHER): Payer: Self-pay | Admitting: Vascular Surgery

## 2024-07-13 ENCOUNTER — Ambulatory Visit (INDEPENDENT_AMBULATORY_CARE_PROVIDER_SITE_OTHER): Payer: HMO | Admitting: Vascular Surgery

## 2024-07-13 ENCOUNTER — Other Ambulatory Visit (INDEPENDENT_AMBULATORY_CARE_PROVIDER_SITE_OTHER): Payer: HMO

## 2024-07-13 VITALS — BP 141/80 | HR 60 | Ht 60.0 in | Wt 94.5 lb

## 2024-07-13 DIAGNOSIS — M79605 Pain in left leg: Secondary | ICD-10-CM

## 2024-07-13 DIAGNOSIS — M79604 Pain in right leg: Secondary | ICD-10-CM

## 2024-07-13 DIAGNOSIS — E782 Mixed hyperlipidemia: Secondary | ICD-10-CM

## 2024-07-13 DIAGNOSIS — I10 Essential (primary) hypertension: Secondary | ICD-10-CM

## 2024-07-13 DIAGNOSIS — I774 Celiac artery compression syndrome: Secondary | ICD-10-CM | POA: Diagnosis not present

## 2024-07-13 DIAGNOSIS — I700 Atherosclerosis of aorta: Secondary | ICD-10-CM | POA: Diagnosis not present

## 2024-07-13 NOTE — Assessment & Plan Note (Signed)
 Duplex today shows normal velocities in the celiac artery, superior mesenteric artery, and inferior mesenteric artery.  Her celiac stent appears patent.  Continue current medical regimen which includes Crestor  and aspirin .  Recheck in 1 year.

## 2024-07-13 NOTE — Progress Notes (Signed)
 MRN : [id number]  [name] is a 89 y.o. ([birth year]) female who presents with chief complaint of  Chief Complaint  Patient presents with   Follow-up  .  History of Present Illness: Patient returns today in follow up of multiple vascular issues.  She is doing reasonably well.  She does still have some left lower leg pain and some numbness and heaviness in both of her feet.  We discussed that this is likely neuropathic in nature.  She does have a history of some mild PAD.  Her studies today showed normal flow with a right ABI of 1.06 and a left ABI of 1.12.  Her waveforms are multiphasic and her digit pressures are nearly normal. She is also over 5 years status post celiac artery intervention for mesenteric ischemic symptoms.  She is doing well.  She is not having unintentional weight loss or postprandial abdominal pain. Duplex today shows normal velocities in the celiac artery, superior mesenteric artery, and inferior mesenteric artery.  Her celiac stent appears patent.  Current Outpatient Medications  Medication Sig Dispense Refill   acetaminophen  (TYLENOL ) 500 MG tablet Take 500 mg by mouth every 6 (six) hours as needed for mild pain.     Alfalfa 500 MG TABS Take 500 mg by mouth daily.     amLODipine  (NORVASC ) 2.5 MG tablet Take 1 tablet (2.5 mg total) by mouth daily. 90 tablet 3   Ascorbic Acid (VITAMIN C CR) 500 MG CPCR Take 500 mg by mouth daily.     aspirin  EC 81 MG tablet Take 1 tablet (81 mg total) by mouth daily. Swallow whole. 90 tablet 2   Calcium  Carbonate (CALCIUM  600 PO) Take by mouth.     Cholecalciferol  (VITAMIN D3 PO) Take 500 Units by mouth daily.     clotrimazole -betamethasone  (LOTRISONE ) cream Apply topically 2 (two) times daily. 30 g 1   diclofenac sodium (VOLTAREN) 1 % GEL Apply topically as needed.     fluticasone  (FLONASE ) 50 MCG/ACT nasal spray Place 1 spray into both nostrils daily.     Ginkgo Biloba (GNP GINGKO BILOBA EXTRACT PO) Take 1 tablet by mouth  daily.     Glucosamine Sulfate 500 MG CAPS Take 500 mg by mouth daily.     isosorbide  mononitrate (IMDUR ) 30 MG 24 hr tablet Take 1 tablet (30 mg total) by mouth daily. 90 tablet 1   Lactobacillus (PROBIOTIC ACIDOPHILUS PO) Take 1 capsule by mouth daily.      loratadine (CLARITIN) 10 MG tablet Take 10 mg by mouth daily as needed for allergies.     Magnesium 400 MG CAPS Take 400 mg by mouth daily.     Multiple Vitamin (MULTIVITAMIN) capsule Take 1 capsule by mouth daily.     nitroGLYCERIN  (NITROSTAT ) 0.4 MG SL tablet DISSOLVE ONE TABLET UNDER THE TONGUE EVERY 5 MINUTES AS NEEDED FOR CHEST PAIN.  DO NOT EXCEED A TOTAL OF 3 DOSES IN 15 MINUTES (Patient not taking: Reported on 05/22/2024) 50 tablet 0   nystatin  cream (MYCOSTATIN ) Apply twice daily to corners of lips until redness resolves 15 g 2   Omega-3 Fatty Acids (OMEGA 3 PO) Take 520 mg by mouth daily.      Pramoxine-HC (HYDROCORTISONE ACE-PRAMOXINE) 2.5-1 % CREA Apply topically as needed.      rosuvastatin  (CRESTOR ) 10 MG tablet Take 1 tablet (10 mg total) by mouth every other day. 45 tablet 1   triamcinolone  cream (KENALOG ) 0.1 % Apply 1 Application topically 2 (two) times daily.  30 g 0   No current facility-administered medications for this visit.    Past Medical History:  Diagnosis Date   Acute COVID-19 06/04/2021   Allergy    Arthritis    Atherosclerosis    Atherosclerosis of abdominal aorta (HCC)    Chronic kidney disease    stage 2   CKD (chronic kidney disease) stage 2, GFR 60-89 ml/min    Collagen vascular disease (HCC)    Compression fracture of thoracic spine, non-traumatic (HCC) 08/19/2015   Seen on plain films October 2016   Diverticulosis 2008   GERD (gastroesophageal reflux disease)    Heart murmur    Hyperlipidemia    Hypertensive urgency 05/04/2022   Lumbar spinal stenosis    Osteoporosis    Ovarian lump    RA (rheumatoid arthritis) (HCC)    Raynaud's disease    Rheumatoid arthritis (HCC)    Spinal stenosis      Past Surgical History:  Procedure Laterality Date   BREAST SURGERY Left 1965   Benign biopsy   CHOLECYSTECTOMY  2005   [place]   COLONOSCOPY  2008   Dr. Luellen   EXCISION OF BREAST BIOPSY Left 1965   benign   EYE SURGERY Bilateral    Cataract Extraction with IOL   KYPHOPLASTY N/A 12/17/2016   Procedure: KYPHOPLASTY;  Surgeon: Ozell Flake, MD;  Location: [facility];  Service: Orthopedics;  Laterality: N/A;  l4   LUMBAR LAMINECTOMY  05/09/2017   L2-L5   VISCERAL ANGIOGRAPHY N/A 08/15/2017   Procedure: VISCERAL ANGIOGRAPHY;  Surgeon: Marea Selinda RAMAN, MD;  Location: [facility];  Service: Cardiovascular;  Laterality: N/A;   VISCERAL ANGIOGRAPHY N/A 10/05/2017   Procedure: VISCERAL ANGIOGRAPHY;  Surgeon: Marea Selinda RAMAN, MD;  Location: [facility];  Service: Cardiovascular;  Laterality: N/A;   VISCERAL ARTERY INTERVENTION N/A 08/15/2017   Procedure: VISCERAL ARTERY INTERVENTION;  Surgeon: Marea Selinda RAMAN, MD;  Location: [facility];  Service: Cardiovascular;  Laterality: N/A;     Social History   Tobacco Use   Smoking status: Never   Smokeless tobacco: Never   Tobacco comments:    smoking cessation materials not required  Vaping Use   Vaping status: Never Used  Substance Use Topics   Alcohol use: No    Alcohol/week: 0.0 standard drinks of alcohol   Drug use: No      Family History  Problem Relation Age of Onset   Heart disease Mother    Hypertension Father    Heart disease Brother 78       heart attack   Cancer Brother        bladder   Diabetes Sister    Alcohol abuse Sister    Kidney disease Sister    Diabetes Brother    Stroke Brother    Diabetes Maternal Grandmother    Heart disease Maternal Grandmother    Cancer Brother        lung     Allergies  Allergen Reactions   Ibandronate Other (See Comments)    Achy all over. Flu like S/S   Other Other (See Comments)    Achy all over. Flu like S/S Dysphagia   Prednisone Rash   Actonel  [Risedronate] Other (See Comments)    Dysphagia   Amoxicillin  Hives   Pantoprazole  Other (See Comments)    Little blistery bumps   Raloxifene Other (See Comments)    Mood swings   Versed  [Midazolam ] Other (See Comments)    Difficult  waking up and memory loss   Doxycycline Rash     REVIEW OF SYSTEMS (Negative unless checked)   Constitutional: [x] Weight loss  [] Fever  [] Chills Cardiac: [] Chest pain   [] Chest pressure   [x] Palpitations   [] Shortness of breath when laying flat   [] Shortness of breath at rest   [] Shortness of breath with exertion. Vascular:  [x] Pain in legs with walking   [] Pain in legs at rest   [] Pain in legs when laying flat   [] Claudication   [] Pain in feet when walking  [x] Pain in feet at rest  [] Pain in feet when laying flat   [] History of DVT   [] Phlebitis   [] Swelling in legs   [x] Varicose veins   [] Non-healing ulcers Pulmonary:   [] Uses home oxygen   [] Productive cough   [] Hemoptysis   [] Wheeze  [] COPD   [] Asthma Neurologic:  [] Dizziness  [] Blackouts   [] Seizures   [] History of stroke   [] History of TIA  [] Aphasia   [] Temporary blindness   [] Dysphagia   [] Weakness or numbness in arms   [] Weakness or numbness in legs Musculoskeletal:  [x] Arthritis   [] Joint swelling   [] Joint pain   [x] Low back pain Hematologic:  [] Easy bruising  [] Easy bleeding   [] Hypercoagulable state   [] Anemic   Gastrointestinal:  [] Blood in stool   [] Vomiting blood  [x] Gastroesophageal reflux/heartburn   [x] Abdominal pain Genitourinary:  [x] Chronic kidney disease   [] Difficult urination  [] Frequent urination  [] Burning with urination   [] Hematuria Skin:  [] Rashes   [] Ulcers   [] Wounds Psychological:  [x] History of anxiety   []  History of major depression.  Physical Examination  BP (!) 141/80   Pulse 60   Ht 5' (1.524 m)   Wt 94 lb 8 oz (42.9 kg)   BMI 18.46 kg/m  Gen:  NAD. Thin, elderly. Appears younger than stated age. Head: [place]/AT, No temporalis wasting. Ear/Nose/Throat: Hearing  grossly intact, nares w/o erythema or drainage Eyes: Conjunctiva clear. Sclera non-icteric Neck: Supple.  Trachea midline Pulmonary:  Good air movement, no use of accessory muscles.  Cardiac: RRR, no JVD Vascular:  Vessel Right Left  Radial Palpable Palpable                          PT Palpable Palpable  DP Palpable Palpable   Gastrointestinal: soft, non-tender/non-distended. No guarding/reflex.  Musculoskeletal: M/S 5/5 throughout.  No deformity or atrophy. No edema. Neurologic: Sensation grossly intact in extremities.  Symmetrical.  Speech is fluent.  Psychiatric: Judgment intact, Mood & affect appropriate for pt's clinical situation. Dermatologic: No rashes or ulcers noted.  No cellulitis or open wounds.      Labs Recent Results (from the past 2160 hours)  Urinalysis, Routine w reflex microscopic     Status: None   Collection Time: 07/03/24  1:54 PM  Result Value Ref Range   Color, Urine YELLOW Yellow;Lt. Yellow;Straw;Dark Yellow;Amber;Green;Red;Brown   APPearance CLEAR Clear;Turbid;Slightly Cloudy;Cloudy   Specific Gravity, Urine 1.010 1.000 - 1.030   pH 6.0 5.0 - 8.0   Total Protein, Urine NEGATIVE Negative   Urine Glucose NEGATIVE Negative   Ketones, ur NEGATIVE Negative   Bilirubin Urine NEGATIVE Negative   Hgb urine dipstick NEGATIVE Negative   Urobilinogen, UA 0.2 0.0 - 1.0   Leukocytes,Ua NEGATIVE Negative   Nitrite NEGATIVE Negative   WBC, UA none seen 0-2/hpf   RBC / HPF none seen 0-2/hpf   Squamous Epithelial / HPF Rare(0-4/hpf) Rare(0-4/hpf)  CBC with Differential/Platelet  Status: None   Collection Time: 07/03/24  1:56 PM  Result Value Ref Range   WBC 4.8 4.0 - 10.5 K/uL   RBC 4.56 3.87 - 5.11 Mil/uL   Hemoglobin 13.4 12.0 - 15.0 g/dL   HCT 58.2 63.9 - 53.9 %   MCV 91.4 78.0 - 100.0 fl   MCHC 32.1 30.0 - 36.0 g/dL   RDW 87.1 88.4 - 84.4 %   Platelets 235.0 150.0 - 400.0 K/uL   Neutrophils Relative % 57.8 43.0 - 77.0 %   Lymphocytes Relative  29.5 12.0 - 46.0 %   Monocytes Relative 9.7 3.0 - 12.0 %   Eosinophils Relative 1.8 0.0 - 5.0 %   Basophils Relative 1.2 0.0 - 3.0 %   Neutro Abs 2.8 1.4 - 7.7 K/uL   Lymphs Abs 1.4 0.7 - 4.0 K/uL   Monocytes Absolute 0.5 0.1 - 1.0 K/uL   Eosinophils Absolute 0.1 0.0 - 0.7 K/uL   Basophils Absolute 0.1 0.0 - 0.1 K/uL  CA 125     Status: None   Collection Time: 07/03/24  1:56 PM  Result Value Ref Range   CA 125 16 <35 U/mL    Comment: . This test was performed using the Siemens  Chemiluminescent method. Values obtained from different assay methods cannot be used  interchangeably. CA 125 levels, regardless of  value, should not be interpreted as absolute evidence of the presence or absence of disease. .   Comprehensive metabolic panel with GFR     Status: Abnormal   Collection Time: 07/03/24  1:56 PM  Result Value Ref Range   Sodium 140 135 - 145 mEq/L   Potassium 4.0 3.5 - 5.1 mEq/L   Chloride 98 96 - 112 mEq/L   CO2 32 19 - 32 mEq/L   Glucose, Bld 120 (H) 70 - 99 mg/dL   BUN 16 6 - 23 mg/dL   Creatinine, Ser 9.43 0.40 - 1.20 mg/dL   Total Bilirubin 0.3 0.2 - 1.2 mg/dL   Alkaline Phosphatase 46 39 - 117 U/L   AST 29 0 - 37 U/L   ALT 15 0 - 35 U/L   Total Protein 6.7 6.0 - 8.3 g/dL   Albumin 4.0 3.5 - 5.2 g/dL   GFR 22.02 >39.99 mL/min    Comment: Calculated using the CKD-EPI Creatinine Equation (2021)   Calcium  9.4 8.4 - 10.5 mg/dL  QuantiFERON-TB Gold Plus     Status: None   Collection Time: 07/03/24  1:56 PM  Result Value Ref Range   QuantiFERON-TB Gold Plus NEGATIVE NEGATIVE    Comment: Negative test result. M. tuberculosis complex  infection unlikely.    NIL 0.04 IU/mL   Mitogen-NIL 6.50 IU/mL   TB1-NIL 0.00 IU/mL   TB2-NIL 0.00 IU/mL    Comment: . The Nil tube value reflects the background interferon gamma immune response of the patient's blood sample. This value has been subtracted from the patient's displayed TB and Mitogen results. . Lower than  expected results with the Mitogen tube prevent false-negative Quantiferon readings by detecting a patient with a potential immune suppressive condition and/or suboptimal pre-analytical specimen handling. . The TB1 Antigen tube is coated with the M. tuberculosis-specific antigens designed to elicit responses from TB antigen primed CD4+ helper T-lymphocytes. . The TB2 Antigen tube is coated with the M. tuberculosis-specific antigens designed to elicit responses from TB antigen primed CD4+ helper and CD8+ cytotoxic T-lymphocytes. . For additional information, please refer to https://education.[facility].com/faq/FAQ204 (This link is being provided for informational/  educational purposes only.) .   Lipid Panel w/reflex Direct LDL     Status: None   Collection Time: 07/03/24  1:56 PM  Result Value Ref Range   Cholesterol 165 <200 mg/dL   HDL 71 > OR = 50 mg/dL   Triglycerides 875 <849 mg/dL   LDL Cholesterol (Calc) 73 mg/dL (calc)    Comment: Reference range: <100 . Desirable range <100 mg/dL for primary prevention;   <70 mg/dL for patients with CHD or diabetic patients  with > or = 2 CHD risk factors. SABRA LDL-C is now calculated using the Martin-Hopkins  calculation, which is a validated novel method providing  better accuracy than the Friedewald equation in the  estimation of LDL-C.  Gladis APPLETHWAITE et al. SANDREA. 7986;689(80): 2061-2068  (http://education.[facility].com/faq/FAQ164)    Total CHOL/HDL Ratio 2.3 <5.0 (calc)   Non-HDL Cholesterol (Calc) 94 <869 mg/dL (calc)    Comment: For patients with diabetes plus 1 major ASCVD risk  factor, treating to a non-HDL-C goal of <100 mg/dL  (LDL-C of <29 mg/dL) is considered a therapeutic  option.     Radiology No results found.  Assessment/Plan  Atherosclerosis of abdominal aorta (HCC) Her studies today showed normal flow with a right ABI of 1.06 and a left ABI of 1.12.  Her waveforms are multiphasic and her digit  pressures are nearly normal.  Her leg pain is not related to malperfusion.  He does not have typical claudication symptoms either.  We will continue to check her ABIs annually.  Celiac artery stenosis (HCC) Duplex today shows normal velocities in the celiac artery, superior mesenteric artery, and inferior mesenteric artery.  Her celiac stent appears patent.  Continue current medical regimen which includes Crestor  and aspirin .  Recheck in 1 year.  Hyperlipidemia lipid control important in reducing the progression of atherosclerotic disease.     Essential hypertension blood pressure control important in reducing the progression of atherosclerotic disease. On appropriate oral medications.  Selinda Gu, MD  07/13/2024 10:26 AM    This note was created with Dragon medical transcription system.  Any errors from dictation are purely unintentional

## 2024-07-13 NOTE — Assessment & Plan Note (Signed)
 Her studies today showed normal flow with a right ABI of 1.06 and a left ABI of 1.12.  Her waveforms are multiphasic and her digit pressures are nearly normal.  Her leg pain is not related to malperfusion.  He does not have typical claudication symptoms either.  We will continue to check her ABIs annually.

## 2024-07-16 LAB — VAS US ABI WITH/WO TBI
Left ABI: 1.12
Right ABI: 1.06

## 2024-07-30 ENCOUNTER — Encounter: Payer: Self-pay | Admitting: Cardiology

## 2024-07-30 ENCOUNTER — Ambulatory Visit: Attending: Cardiology | Admitting: Cardiology

## 2024-07-30 VITALS — BP 121/60 | HR 56 | Ht 60.0 in | Wt 96.0 lb

## 2024-07-30 DIAGNOSIS — I493 Ventricular premature depolarization: Secondary | ICD-10-CM

## 2024-07-30 DIAGNOSIS — I10 Essential (primary) hypertension: Secondary | ICD-10-CM | POA: Diagnosis not present

## 2024-07-30 MED ORDER — ISOSORBIDE MONONITRATE ER 30 MG PO TB24: 15.0000 mg | ORAL_TABLET | Freq: Every day | ORAL | Status: DC

## 2024-07-30 MED ORDER — NITROGLYCERIN 0.4 MG SL SUBL: 0.4000 mg | SUBLINGUAL_TABLET | SUBLINGUAL | 0 refills | Status: AC | PRN

## 2024-07-30 NOTE — Patient Instructions (Signed)
 Medication Instructions:  -STOP NORVASC  -STOP CRESTOR  - DECREASE IMDUR  TO 15 MG (HALF TAB)   *If you need a refill on your cardiac medications before your next appointment, please call your pharmacy*  Lab Work: No labs ordered today  If you have labs (blood work) drawn today and your tests are completely normal, you will receive your results only by: MyChart Message (if you have MyChart) OR A paper copy in the mail If you have any lab test that is abnormal or we need to change your treatment, we will call you to review the results.  Testing/Procedures: No test ordered today   Follow-Up: At [facility], you and your health needs are our priority.  As part of our continuing mission to provide you with exceptional heart care, our providers are all part of one team.  This team includes your primary Cardiologist (physician) and Advanced Practice Providers or APPs (Physician Assistants and Nurse Practitioners) who all work together to provide you with the care you need, when you need it.  Your next appointment:   3 month(s)  Provider:   You may see Redell Cave, MD

## 2024-07-30 NOTE — Progress Notes (Signed)
 Cardiology Office Note:    Date:  07/30/2024   ID:  [name], DOB [birth year], MRN [id number]  PCP:  Marylynn Verneita CROME, MD  [facility] Cardiologist:  Redell Cave, MD  [name] Electrophysiologist:  None   Referring MD: Marylynn Verneita CROME, MD   Chief Complaint  Patient presents with   Follow-up    Pt weakness, poor circulations, lighthearted, sometimes dizziness.     History of Present Illness:    [name] is a 89 y.o. female with a hx of hypertension, frequent PVCs, CKD, celiac artery stenosis s/p stenting who presents for follow-up.   Previously seen due to fatigue, bradycardia.  Toprol -XL was stopped with good effect initially, for a month later began feeling fatigue again.  Also complains of lightheadedness, generalized weakness.  Recent BP with PCP showed systolic in the 120s.  Takes amlodipine , Imdur .  Denies chest pain.   Prior notes Cardiac monitor 06/2021 frequent PVCs 12.2% burden Echo 06/2021 EF 60 to 65%, impaired relaxation. Lexiscan  Myoview  04/2020, no evidence for ischemia, low risk study.   Past Medical History:  Diagnosis Date   Acute COVID-19 06/04/2021   Allergy    Arthritis    Atherosclerosis    Atherosclerosis of abdominal aorta (HCC)    Chronic kidney disease    stage 2   CKD (chronic kidney disease) stage 2, GFR 60-89 ml/min    Collagen vascular disease (HCC)    Compression fracture of thoracic spine, non-traumatic (HCC) 08/19/2015   Seen on plain films October 2016   Diverticulosis 2008   GERD (gastroesophageal reflux disease)    Heart murmur    Hyperlipidemia    Hypertensive urgency 05/04/2022   Lumbar spinal stenosis    Osteoporosis    Ovarian lump    RA (rheumatoid arthritis) (HCC)    Raynaud's disease    Rheumatoid arthritis (HCC)    Spinal stenosis     Past Surgical History:  Procedure Laterality Date   BREAST SURGERY Left 1965   Benign biopsy   CHOLECYSTECTOMY  2005   [place]   COLONOSCOPY   2008   Dr. Luellen   EXCISION OF BREAST BIOPSY Left 1965   benign   EYE SURGERY Bilateral    Cataract Extraction with IOL   KYPHOPLASTY N/A 12/17/2016   Procedure: KYPHOPLASTY;  Surgeon: Ozell Flake, MD;  Location: [facility] ORS;  Service: Orthopedics;  Laterality: N/A;  l4   LUMBAR LAMINECTOMY  05/09/2017   L2-L5   VISCERAL ANGIOGRAPHY N/A 08/15/2017   Procedure: VISCERAL ANGIOGRAPHY;  Surgeon: Marea Selinda RAMAN, MD;  Location: [facility] INVASIVE CV LAB;  Service: Cardiovascular;  Laterality: N/A;   VISCERAL ANGIOGRAPHY N/A 10/05/2017   Procedure: VISCERAL ANGIOGRAPHY;  Surgeon: Marea Selinda RAMAN, MD;  Location: [facility] INVASIVE CV LAB;  Service: Cardiovascular;  Laterality: N/A;   VISCERAL ARTERY INTERVENTION N/A 08/15/2017   Procedure: VISCERAL ARTERY INTERVENTION;  Surgeon: Marea Selinda RAMAN, MD;  Location: [facility] INVASIVE CV LAB;  Service: Cardiovascular;  Laterality: N/A;    Current Medications: Current Meds  Medication Sig   acetaminophen  (TYLENOL ) 500 MG tablet Take 500 mg by mouth every 6 (six) hours as needed for mild pain.   Alfalfa 500 MG TABS Take 500 mg by mouth daily.   Ascorbic Acid (VITAMIN C CR) 500 MG CPCR Take 500 mg by mouth daily.   aspirin  EC 81 MG tablet Take 1 tablet (81 mg total) by mouth daily. Swallow whole.   Calcium  Carbonate (CALCIUM  600 PO) Take by mouth.  Cholecalciferol  (VITAMIN D3 PO) Take 500 Units by mouth daily.   clotrimazole -betamethasone  (LOTRISONE ) cream Apply topically 2 (two) times daily.   diclofenac sodium (VOLTAREN) 1 % GEL Apply topically as needed.   fluticasone  (FLONASE ) 50 MCG/ACT nasal spray Place 1 spray into both nostrils daily.   Ginkgo Biloba (GNP GINGKO BILOBA EXTRACT PO) Take 1 tablet by mouth daily.   Glucosamine Sulfate 500 MG CAPS Take 500 mg by mouth daily.   Lactobacillus (PROBIOTIC ACIDOPHILUS PO) Take 1 capsule by mouth daily.    loratadine (CLARITIN) 10 MG tablet Take 10 mg by mouth daily as needed for allergies.   Magnesium 400 MG CAPS Take 400 mg  by mouth daily.   Multiple Vitamin (MULTIVITAMIN) capsule Take 1 capsule by mouth daily.   nystatin  cream (MYCOSTATIN ) Apply twice daily to corners of lips until redness resolves   Omega-3 Fatty Acids (OMEGA 3 PO) Take 520 mg by mouth daily.    Pramoxine-HC (HYDROCORTISONE ACE-PRAMOXINE) 2.5-1 % CREA Apply topically as needed.    triamcinolone  cream (KENALOG ) 0.1 % Apply 1 Application topically 2 (two) times daily.   [DISCONTINUED] amLODipine  (NORVASC ) 2.5 MG tablet Take 1 tablet (2.5 mg total) by mouth daily.   [DISCONTINUED] isosorbide  mononitrate (IMDUR ) 30 MG 24 hr tablet Take 1 tablet (30 mg total) by mouth daily.   [DISCONTINUED] nitroGLYCERIN  (NITROSTAT ) 0.4 MG SL tablet DISSOLVE ONE TABLET UNDER THE TONGUE EVERY 5 MINUTES AS NEEDED FOR CHEST PAIN.  DO NOT EXCEED A TOTAL OF 3 DOSES IN 15 MINUTES (Patient taking differently: Place 0.4 mg under the tongue as needed for chest pain.)   [DISCONTINUED] rosuvastatin  (CRESTOR ) 10 MG tablet Take 1 tablet (10 mg total) by mouth every other day.     Allergies:   Ibandronate, Other, Prednisone, Actonel [risedronate], Amoxicillin , Pantoprazole , Raloxifene, Versed  [midazolam ], and Doxycycline   Social History   Socioeconomic History   Marital status: Widowed    Spouse name: Elsie   Number of children: 3   Years of education: some college   Highest education level: 12th grade  Occupational History    Employer: RETIRED  Tobacco Use   Smoking status: Never   Smokeless tobacco: Never   Tobacco comments:    smoking cessation materials not required  Vaping Use   Vaping status: Never Used  Substance and Sexual Activity   Alcohol use: No    Alcohol/week: 0.0 standard drinks of alcohol   Drug use: No   Sexual activity: Not Currently  Other Topics Concern   Not on file  Social History Narrative   Widowed   3 daughter   Social Drivers of Corporate investment banker Strain: Low Risk  (07/02/2024)   Overall Financial Resource Strain  (CARDIA)    Difficulty of Paying Living Expenses: Not very hard  Food Insecurity: No Food Insecurity (07/02/2024)   Hunger Vital Sign    Worried About Running Out of Food in the Last Year: Never true    Ran Out of Food in the Last Year: Never true  Transportation Needs: No Transportation Needs (07/02/2024)   PRAPARE - Administrator, Civil Service (Medical): No    Lack of Transportation (Non-Medical): No  Physical Activity: Sufficiently Active (07/02/2024)   Exercise Vital Sign    Days of Exercise per Week: 4 days    Minutes of Exercise per Session: 60 min  Recent Concern: Physical Activity - Inactive (05/22/2024)   Exercise Vital Sign    Days of Exercise per Week: 0 days  Minutes of Exercise per Session: 0 min  Stress: No Stress Concern Present (07/02/2024)   [employer] of Occupational Health - Occupational Stress Questionnaire    Feeling of Stress: Only a little  Social Connections: Moderately Integrated (07/02/2024)   Social Connection and Isolation Panel    Frequency of Communication with Friends and Family: More than three times a week    Frequency of Social Gatherings with Friends and Family: More than three times a week    Attends Religious Services: More than 4 times per year    Active Member of [employer] or Organizations: Yes    Attends Banker Meetings: More than 4 times per year    Marital Status: Widowed  Recent Concern: Social Connections - Moderately Isolated (05/22/2024)   Social Connection and Isolation Panel    Frequency of Communication with Friends and Family: More than three times a week    Frequency of Social Gatherings with Friends and Family: More than three times a week    Attends Religious Services: More than 4 times per year    Active Member of [employer] or Organizations: No    Attends Banker Meetings: Never    Marital Status: Widowed     Family History: The patient's family history includes Alcohol abuse in her sister;  Cancer in her brother and brother; Diabetes in her brother, maternal grandmother, and sister; Heart disease in her maternal grandmother and mother; Heart disease (age of onset: 23) in her brother; Hypertension in her father; Kidney disease in her sister; Stroke in her brother.  ROS:   Please see the history of present illness.     All other systems reviewed and are negative.  EKGs/Labs/Other Studies Reviewed:    The following studies were reviewed today:   Recent Labs: 01/03/2024: TSH 1.89 07/03/2024: ALT 15; BUN 16; Creatinine, Ser 0.56; Hemoglobin 13.4; Platelets 235.0; Potassium 4.0; Sodium 140  Recent Lipid Panel    Component Value Date/Time   CHOL 165 07/03/2024 1356   CHOL 205 (H) 12/19/2015 1422   TRIG 124 07/03/2024 1356   HDL 71 07/03/2024 1356   HDL 84 12/19/2015 1422   CHOLHDL 2.3 07/03/2024 1356   VLDL 13.8 10/14/2023 0724   LDLCALC 73 07/03/2024 1356   LDLDIRECT 96.0 03/10/2023 1214    Physical Exam:    VS:  BP 121/60 (BP Location: Left Arm, Patient Position: Sitting, Cuff Size: Normal)   Pulse (!) 56   Ht 5' (1.524 m)   Wt 96 lb (43.5 kg)   SpO2 97%   BMI 18.75 kg/m     Wt Readings from Last 3 Encounters:  07/30/24 96 lb (43.5 kg)  07/13/24 94 lb 8 oz (42.9 kg)  07/03/24 96 lb 12.8 oz (43.9 kg)     GEN:  Well nourished, well developed in no acute distress HEENT: Normal NECK: No JVD; No carotid bruits CARDIAC: RRR, no murmurs, rubs, gallops RESPIRATORY:  Clear to auscultation without rales, wheezing or rhonchi  ABDOMEN: Soft, non-tender, non-distended MUSCULOSKELETAL:  No edema; No deformity  SKIN: Warm and dry NEUROLOGIC:  Alert and oriented x 3 PSYCHIATRIC:  Normal affect   ASSESSMENT:    1. Frequent PVCs   2. Primary hypertension    PLAN:    In order of problems listed above:  Frequent PVCs, palpitations, EF 60 to 65%.  Toprol -XL previously stopped due to symptoms of fatigue, bradycardia. Current heart rate 56.  Continue to monitor off AV  nodal agents. Hypertension, BP controlled.SABRA  Symptoms of fatigue persist.  Stop Norvasc , reduce Imdur  to 50 mg daily.  Okay to let BP run high, 140s-150s okay.  Consider stopping Imdur  if fatigue persists at follow-up visit.  Stop Crestor  given age, symptoms of fatigue.  Follow-up in 3 months..   Medication Adjustments/Labs and Tests Ordered: Current medicines are reviewed at length with the patient today.  Concerns regarding medicines are outlined above.  Orders Placed This Encounter  Procedures   EKG 12-Lead    Meds ordered this encounter  Medications   isosorbide  mononitrate (IMDUR ) 30 MG 24 hr tablet    Sig: Take 0.5 tablets (15 mg total) by mouth daily.   nitroGLYCERIN  (NITROSTAT ) 0.4 MG SL tablet    Sig: Place 1 tablet (0.4 mg total) under the tongue as needed for chest pain.    Dispense:  30 tablet    Refill:  0     Patient Instructions  Medication Instructions:  -STOP NORVASC  -STOP CRESTOR  - DECREASE IMDUR  TO 15 MG (HALF TAB)   *If you need a refill on your cardiac medications before your next appointment, please call your pharmacy*  Lab Work: No labs ordered today  If you have labs (blood work) drawn today and your tests are completely normal, you will receive your results only by: MyChart Message (if you have MyChart) OR A paper copy in the mail If you have any lab test that is abnormal or we need to change your treatment, we will call you to review the results.  Testing/Procedures: No test ordered today   Follow-Up: At [facility], you and your health needs are our priority.  As part of our continuing mission to provide you with exceptional heart care, our providers are all part of one team.  This team includes your primary Cardiologist (physician) and Advanced Practice Providers or APPs (Physician Assistants and Nurse Practitioners) who all work together to provide you with the care you need, when you need it.  Your next appointment:   3  month(s)  Provider:   You may see Redell Cave, MD           Signed, Redell Cave, MD  07/30/2024 12:33 PM    [facility]

## 2024-08-28 ENCOUNTER — Other Ambulatory Visit: Payer: Self-pay

## 2024-08-28 ENCOUNTER — Emergency Department

## 2024-08-28 ENCOUNTER — Encounter: Payer: Self-pay | Admitting: Emergency Medicine

## 2024-08-28 ENCOUNTER — Observation Stay
Admission: EM | Admit: 2024-08-28 | Discharge: 2024-08-30 | Disposition: A | Discharge status: Home / Self Care | Attending: Internal Medicine | Admitting: Internal Medicine

## 2024-08-28 DIAGNOSIS — R202 Paresthesia of skin: Secondary | ICD-10-CM | POA: Diagnosis not present

## 2024-08-28 DIAGNOSIS — Z8616 Personal history of COVID-19: Secondary | ICD-10-CM | POA: Insufficient documentation

## 2024-08-28 DIAGNOSIS — I672 Cerebral atherosclerosis: Secondary | ICD-10-CM | POA: Diagnosis not present

## 2024-08-28 DIAGNOSIS — N182 Chronic kidney disease, stage 2 (mild): Secondary | ICD-10-CM | POA: Insufficient documentation

## 2024-08-28 DIAGNOSIS — I6381 Other cerebral infarction due to occlusion or stenosis of small artery: Secondary | ICD-10-CM

## 2024-08-28 DIAGNOSIS — S0990XA Unspecified injury of head, initial encounter: Secondary | ICD-10-CM | POA: Diagnosis not present

## 2024-08-28 DIAGNOSIS — I700 Atherosclerosis of aorta: Secondary | ICD-10-CM | POA: Diagnosis not present

## 2024-08-28 DIAGNOSIS — E785 Hyperlipidemia, unspecified: Secondary | ICD-10-CM | POA: Diagnosis not present

## 2024-08-28 DIAGNOSIS — R42 Dizziness and giddiness: Secondary | ICD-10-CM | POA: Diagnosis not present

## 2024-08-28 DIAGNOSIS — I6389 Other cerebral infarction: Secondary | ICD-10-CM | POA: Diagnosis not present

## 2024-08-28 DIAGNOSIS — M25511 Pain in right shoulder: Secondary | ICD-10-CM | POA: Insufficient documentation

## 2024-08-28 DIAGNOSIS — I2510 Atherosclerotic heart disease of native coronary artery without angina pectoris: Secondary | ICD-10-CM | POA: Insufficient documentation

## 2024-08-28 DIAGNOSIS — I129 Hypertensive chronic kidney disease with stage 1 through stage 4 chronic kidney disease, or unspecified chronic kidney disease: Secondary | ICD-10-CM | POA: Insufficient documentation

## 2024-08-28 DIAGNOSIS — Z7902 Long term (current) use of antithrombotics/antiplatelets: Secondary | ICD-10-CM | POA: Diagnosis not present

## 2024-08-28 DIAGNOSIS — K5730 Diverticulosis of large intestine without perforation or abscess without bleeding: Secondary | ICD-10-CM | POA: Diagnosis not present

## 2024-08-28 DIAGNOSIS — I10 Essential (primary) hypertension: Secondary | ICD-10-CM

## 2024-08-28 DIAGNOSIS — Z85118 Personal history of other malignant neoplasm of bronchus and lung: Secondary | ICD-10-CM | POA: Insufficient documentation

## 2024-08-28 DIAGNOSIS — R079 Chest pain, unspecified: Secondary | ICD-10-CM | POA: Diagnosis not present

## 2024-08-28 DIAGNOSIS — R739 Hyperglycemia, unspecified: Secondary | ICD-10-CM

## 2024-08-28 DIAGNOSIS — Z79899 Other long term (current) drug therapy: Secondary | ICD-10-CM | POA: Insufficient documentation

## 2024-08-28 DIAGNOSIS — Z7982 Long term (current) use of aspirin: Secondary | ICD-10-CM | POA: Insufficient documentation

## 2024-08-28 DIAGNOSIS — I639 Cerebral infarction, unspecified: Secondary | ICD-10-CM | POA: Diagnosis not present

## 2024-08-28 DIAGNOSIS — I6782 Cerebral ischemia: Secondary | ICD-10-CM | POA: Diagnosis not present

## 2024-08-28 DIAGNOSIS — R4781 Slurred speech: Secondary | ICD-10-CM | POA: Diagnosis not present

## 2024-08-28 DIAGNOSIS — Z8673 Personal history of transient ischemic attack (TIA), and cerebral infarction without residual deficits: Secondary | ICD-10-CM | POA: Diagnosis present

## 2024-08-28 DIAGNOSIS — E876 Hypokalemia: Secondary | ICD-10-CM | POA: Insufficient documentation

## 2024-08-28 DIAGNOSIS — E1122 Type 2 diabetes mellitus with diabetic chronic kidney disease: Secondary | ICD-10-CM | POA: Insufficient documentation

## 2024-08-28 DIAGNOSIS — K045 Chronic apical periodontitis: Secondary | ICD-10-CM | POA: Diagnosis not present

## 2024-08-28 LAB — CBC WITH DIFFERENTIAL/PLATELET
Abs Immature Granulocytes: 0.01 K/uL (ref 0.00–0.07)
Basophils Absolute: 0 K/uL (ref 0.0–0.1)
Basophils Relative: 1 %
Eosinophils Absolute: 0 K/uL (ref 0.0–0.5)
Eosinophils Relative: 1 %
HCT: 43.2 % (ref 36.0–46.0)
Hemoglobin: 14.2 g/dL (ref 12.0–15.0)
Immature Granulocytes: 0 %
Lymphocytes Relative: 21 %
Lymphs Abs: 0.9 K/uL (ref 0.7–4.0)
MCH: 29.8 pg (ref 26.0–34.0)
MCHC: 32.9 g/dL (ref 30.0–36.0)
MCV: 90.8 fL (ref 80.0–100.0)
Monocytes Absolute: 0.4 K/uL (ref 0.1–1.0)
Monocytes Relative: 8 %
Neutro Abs: 2.8 K/uL (ref 1.7–7.7)
Neutrophils Relative %: 69 %
Platelets: 193 K/uL (ref 150–400)
RBC: 4.76 MIL/uL (ref 3.87–5.11)
RDW: 13.2 % (ref 11.5–15.5)
WBC: 4.2 K/uL (ref 4.0–10.5)
nRBC: 0 % (ref 0.0–0.2)

## 2024-08-28 LAB — COMPREHENSIVE METABOLIC PANEL WITH GFR
ALT: 18 U/L (ref 0–44)
AST: 37 U/L (ref 15–41)
Albumin: 3.7 g/dL (ref 3.5–5.0)
Alkaline Phosphatase: 47 U/L (ref 38–126)
Anion gap: 11 (ref 5–15)
BUN: 16 mg/dL (ref 8–23)
CO2: 26 mmol/L (ref 22–32)
Calcium: 8.9 mg/dL (ref 8.9–10.3)
Chloride: 101 mmol/L (ref 98–111)
Creatinine, Ser: 0.6 mg/dL (ref 0.44–1.00)
GFR, Estimated: >60 mL/min (ref >60)
Glucose, Bld: 139 mg/dL — ABNORMAL HIGH (ref 70–99)
Potassium: 4 mmol/L (ref 3.5–5.1)
Sodium: 138 mmol/L (ref 135–145)
Total Bilirubin: 0.6 mg/dL (ref 0.0–1.2)
Total Protein: 7.1 g/dL (ref 6.5–8.1)

## 2024-08-28 LAB — URINALYSIS, ROUTINE W REFLEX MICROSCOPIC
Bilirubin Urine: NEGATIVE
Glucose, UA: NEGATIVE mg/dL
Hgb urine dipstick: NEGATIVE
Ketones, ur: NEGATIVE mg/dL
Leukocytes,Ua: NEGATIVE
Nitrite: NEGATIVE
Protein, ur: NEGATIVE mg/dL
Specific Gravity, Urine: 1.005 (ref 1.005–1.030)
pH: 6 (ref 5.0–8.0)

## 2024-08-28 LAB — TROPONIN I (HIGH SENSITIVITY)
Troponin I (High Sensitivity): 14 ng/L (ref <18)
Troponin I (High Sensitivity): 15 ng/L (ref <18)

## 2024-08-28 LAB — CBG MONITORING, ED: Glucose-Capillary: 138 mg/dL — ABNORMAL HIGH (ref 70–99)

## 2024-08-28 MED ORDER — ACETAMINOPHEN 650 MG RE SUPP: 650.0000 mg | Freq: Four times a day (QID) | RECTAL | Status: DC | PRN

## 2024-08-28 MED ORDER — ISOSORBIDE MONONITRATE ER 30 MG PO TB24
15.0000 mg | ORAL_TABLET | Freq: Every day | ORAL | Status: DC
  Administered 2024-08-29 – 2024-08-30 (×2): 15 mg via ORAL
  Filled 2024-08-28 (×2): qty 1

## 2024-08-28 MED ORDER — VITAMIN C 500 MG PO TABS
500.0000 mg | ORAL_TABLET | Freq: Every day | ORAL | Status: DC
  Administered 2024-08-29 – 2024-08-30 (×2): 500 mg via ORAL
  Filled 2024-08-28 (×2): qty 1

## 2024-08-28 MED ORDER — CALCIUM CARBONATE ANTACID 500 MG PO CHEW
1500.0000 mg | CHEWABLE_TABLET | Freq: Every day | ORAL | Status: DC
  Administered 2024-08-29 – 2024-08-30 (×2): 600 mg via ORAL
  Filled 2024-08-28 (×2): qty 3

## 2024-08-28 MED ORDER — ALFALFA 500 MG PO TABS: 500.0000 mg | ORAL_TABLET | Freq: Every day | ORAL | Status: DC

## 2024-08-28 MED ORDER — DICLOFENAC SODIUM 1 % TD GEL: 4.0000 g | Freq: Four times a day (QID) | TRANSDERMAL | Status: DC

## 2024-08-28 MED ORDER — IOHEXOL 350 MG/ML SOLN
100.0000 mL | Freq: Once | INTRAVENOUS | Status: AC | PRN
  Administered 2024-08-28: 75 mL via INTRAVENOUS

## 2024-08-28 MED ORDER — OMEGA-3-ACID ETHYL ESTERS 1 G PO CAPS
1.0000 g | ORAL_CAPSULE | Freq: Every day | ORAL | Status: DC
  Administered 2024-08-29 – 2024-08-30 (×2): 1 g via ORAL
  Filled 2024-08-28 (×2): qty 1

## 2024-08-28 MED ORDER — ACETAMINOPHEN 325 MG PO TABS: 650.0000 mg | ORAL_TABLET | Freq: Four times a day (QID) | ORAL | Status: DC | PRN

## 2024-08-28 MED ORDER — MAGNESIUM OXIDE -MG SUPPLEMENT 400 (240 MG) MG PO TABS
400.0000 mg | ORAL_TABLET | Freq: Every day | ORAL | Status: DC
  Administered 2024-08-29 – 2024-08-30 (×2): 400 mg via ORAL
  Filled 2024-08-28 (×2): qty 1

## 2024-08-28 MED ORDER — LORATADINE 10 MG PO TABS: 10.0000 mg | ORAL_TABLET | Freq: Every day | ORAL | Status: DC | PRN

## 2024-08-28 MED ORDER — TRAZODONE HCL 50 MG PO TABS
25.0000 mg | ORAL_TABLET | Freq: Every evening | ORAL | Status: DC | PRN
  Administered 2024-08-29: 25 mg via ORAL
  Filled 2024-08-28: qty 1

## 2024-08-28 MED ORDER — GLUCOSAMINE SULFATE 500 MG PO CAPS: 500.0000 mg | ORAL_CAPSULE | Freq: Every day | ORAL | Status: DC

## 2024-08-28 MED ORDER — STROKE: EARLY STAGES OF RECOVERY BOOK: Freq: Once | Status: AC

## 2024-08-28 MED ORDER — ADULT MULTIVITAMIN W/MINERALS CH
1.0000 | ORAL_TABLET | Freq: Every day | ORAL | Status: DC
  Administered 2024-08-29 – 2024-08-30 (×2): 1 via ORAL
  Filled 2024-08-28 (×2): qty 1

## 2024-08-28 MED ORDER — SODIUM CHLORIDE 0.9 % IV SOLN: INTRAVENOUS | Status: DC

## 2024-08-28 MED ORDER — ASPIRIN 81 MG PO CHEW
324.0000 mg | CHEWABLE_TABLET | Freq: Once | ORAL | Status: AC
  Administered 2024-08-28: 324 mg via ORAL
  Filled 2024-08-28: qty 4

## 2024-08-28 MED ORDER — NITROGLYCERIN 0.4 MG SL SUBL: 0.4000 mg | SUBLINGUAL_TABLET | SUBLINGUAL | Status: DC | PRN

## 2024-08-28 MED ORDER — ONDANSETRON HCL 4 MG PO TABS
4.0000 mg | ORAL_TABLET | Freq: Four times a day (QID) | ORAL | Status: DC | PRN
  Administered 2024-08-29: 4 mg via ORAL
  Filled 2024-08-28: qty 1

## 2024-08-28 MED ORDER — GINKGO BILOBA EXTRACT 60 MG PO CAPS: ORAL_CAPSULE | Freq: Every day | ORAL | Status: DC

## 2024-08-28 MED ORDER — ASPIRIN 81 MG PO TBEC
81.0000 mg | DELAYED_RELEASE_TABLET | Freq: Every day | ORAL | Status: DC
  Administered 2024-08-29 – 2024-08-30 (×2): 81 mg via ORAL
  Filled 2024-08-28 (×2): qty 1

## 2024-08-28 MED ORDER — VITAMIN D 25 MCG (1000 UNIT) PO TABS
500.0000 [IU] | ORAL_TABLET | Freq: Every day | ORAL | Status: DC
  Administered 2024-08-29 – 2024-08-30 (×2): 500 [IU] via ORAL
  Filled 2024-08-28 (×2): qty 1

## 2024-08-28 MED ORDER — FLUTICASONE PROPIONATE 50 MCG/ACT NA SUSP: 1.0000 | Freq: Every day | NASAL | Status: DC | PRN

## 2024-08-28 MED ORDER — DICLOFENAC SODIUM 1 % EX GEL
4.0000 g | Freq: Four times a day (QID) | CUTANEOUS | Status: DC
  Administered 2024-08-29 (×3): 4 g via TOPICAL
  Filled 2024-08-28: qty 100

## 2024-08-28 MED ORDER — ENOXAPARIN SODIUM 30 MG/0.3ML IJ SOSY
30.0000 mg | PREFILLED_SYRINGE | INTRAMUSCULAR | Status: DC
  Administered 2024-08-28 – 2024-08-29 (×2): 30 mg via SUBCUTANEOUS
  Filled 2024-08-28 (×2): qty 0.3

## 2024-08-28 MED ORDER — ONDANSETRON HCL 4 MG/2ML IJ SOLN: 4.0000 mg | Freq: Four times a day (QID) | INTRAMUSCULAR | Status: DC | PRN

## 2024-08-28 MED ORDER — MAGNESIUM HYDROXIDE 400 MG/5ML PO SUSP: 30.0000 mL | Freq: Every day | ORAL | Status: DC | PRN

## 2024-08-28 NOTE — ED Notes (Signed)
 Pt states a funny feeling on the right check which was present upon arrival. No changes in condition since arrival. Pt able to ambulate with min assistance to bathroom. Pt able to drink and eat crackers and applesauce w/o difficulty

## 2024-08-28 NOTE — Assessment & Plan Note (Signed)
-   She will be continued on aspirin  and Imdur .

## 2024-08-28 NOTE — ED Notes (Signed)
 This tech initiated fall bundle at this time (yellow socks, armband, and bed alarm on). Pt provided with warm blanket, family at bedside. Pt and family denies any further needs at this time.

## 2024-08-28 NOTE — Progress Notes (Signed)
 Anticoagulation monitoring(Lovenox ):  89 yo female ordered Lovenox  40 mg Q24h    Filed Weights   08/28/24 1054  Weight: 43.3 kg (95 lb 7.4 oz)   BMI 18.6   Lab Results  Component Value Date   CREATININE 0.60 08/28/2024   CREATININE 0.56 07/03/2024   CREATININE 0.61 10/14/2023   Estimated Creatinine Clearance: 29.4 mL/min (by C-G formula based on SCr of 0.6 mg/dL). Hemoglobin & Hematocrit     Component Value Date/Time   HGB 14.2 08/28/2024 1036   HGB 14.1 12/19/2015 1422   HCT 43.2 08/28/2024 1036   HCT 43.0 12/19/2015 1422     Per Protocol for Patient with estCrcl < 30 ml/min and BMI < 30, will transition to Lovenox  30 mg Q24h.

## 2024-08-28 NOTE — Progress Notes (Signed)
 PHARMACIST - PHYSICIAN ORDER COMMUNICATION  CONCERNING: P&T Medication Policy on Herbal Medications  DESCRIPTION:  This patient's order for:  Alfalfa,  Glucosamine-Chondroitin and Gingko Biloba  has been noted.  This product(s) is classified as an "herbal" or natural product. Due to a lack of definitive safety studies or FDA approval, nonstandard manufacturing practices, plus the potential risk of unknown drug-drug interactions while on inpatient medications, the Pharmacy and Therapeutics Committee does not permit the use of "herbal" or natural products of this type within [facility].   ACTION TAKEN: The pharmacy department is unable to verify this order at this time and your patient has been informed of this safety policy. Please reevaluate patient's clinical condition at discharge and address if the herbal or natural product(s) should be resumed at that time.

## 2024-08-28 NOTE — ED Triage Notes (Signed)
 First nurse note: Pt arrived to ED from home with her daughter via private auto. Pt states she has been experiencing vertigo for the past couple of days with dizziness with position changes. No hx of the same. Pt states she went to bed feeling normal and this morning woke up around 9 am with right sided numbness to her face and arm. Pt alert with clear speech.

## 2024-08-28 NOTE — Assessment & Plan Note (Signed)
-   Will continue antihypertensive therapy with permissive parameters.

## 2024-08-28 NOTE — Discharge Instructions (Signed)
 Workup was re-assuring  Call ent to make a follow up for your ear Return to ER if you develop changes in speech, weakness or any other concerns.

## 2024-08-28 NOTE — ED Triage Notes (Signed)
 Pt taken to RM 14 for further evaluation. Pt states right arm numbness- pt can feel this RN touch. Denies weakness or vision changes. Daughter states slurred speech and stumbling around this AM. Clear speech at this time. Woke up at 9am with symptoms but went to bed at 9pm.

## 2024-08-28 NOTE — ED Provider Notes (Signed)
  Physical Exam  BP (!) 176/85   Pulse (!) 56   Temp (!) 97.5 F (36.4 C) (Oral)   Resp 20   Ht 5' (1.524 m)   Wt 43.3 kg   SpO2 98%   BMI 18.64 kg/m   Physical Exam  Procedures  Procedures  ED Course / MDM   Clinical Course as of 08/28/24 2107  Tue Aug 28, 2024  1607 S/o from Dr. Ernest: - 51F transient bue numbness, ?prior slurred speech - CT dissection and CTH neg, trop neg x 2 - pending UA, MRI  If MRI neg, family prefers dc home over TIA workup inpatient [MM]  1636 Urinalysis with no evidence of infection [MM]  2010 MR brain: IMPRESSION: 1. 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 2. Possible punctate subacute infarcts in the right internal capsule and right basal ganglia. 3. Moderate chronic small vessel ischemic disease.   [MM]  2105 Patient reevaluated, updated on findings  Does continue to have some paresthesias and subjective weakness in her right arm.  Strength testing on my evaluation is reassuring with no obvious weakness.  In shared decision making, patient would like to pursue admission for further workup of stroke.  Ordering aspirin .  Hospitalist consult order placed. [MM]    Clinical Course User Index [MM] Clarine Ozell LABOR, MD   Medical Decision Making Amount and/or Complexity of Data Reviewed Labs: ordered. Radiology: ordered.  Risk OTC drugs. Prescription drug management.          Clarine Ozell LABOR, MD 08/28/24 2107

## 2024-08-28 NOTE — ED Provider Notes (Signed)
 [facility] Provider Note    Event Date/Time   First MD Initiated Contact with Patient 08/28/24 1050     (approximate)   History   Numbness   HPI  Lanai Conlee is a 89 y.o. female who comes in with concerns for right arm numbness.  According to patient she actually reported a little bit of left arm numbness that then moved to the right side.  She does report some pain under her right shoulder blade.  According to the daughter they noted a change in her speech and she was tumbling around this morning but here her speech is clear.  Patient had reportedly gone to bed at 9 PM was her normal self and woke up at 9 AM with the symptoms.  Physical Exam   Triage Vital Signs: ED Triage Vitals  Encounter Vitals Group     BP 08/28/24 1050 (!) 174/87     Girls Systolic BP Percentile --      Girls Diastolic BP Percentile --      Boys Systolic BP Percentile --      Boys Diastolic BP Percentile --      Pulse Rate 08/28/24 1050 61     Resp 08/28/24 1050 18     Temp 08/28/24 1057 (!) 97.5 F (36.4 C)     Temp Source 08/28/24 1057 Oral     SpO2 08/28/24 1050 98 %     Weight 08/28/24 1054 95 lb 7.4 oz (43.3 kg)     Height 08/28/24 1054 5' (1.524 m)     Head Circumference --      Peak Flow --      Pain Score 08/28/24 1054 0     Pain Loc --      Pain Education --      Exclude from Growth Chart --     Most recent vital signs: Vitals:   08/28/24 1050 08/28/24 1057  BP: (!) 174/87   Pulse: 61   Resp: 18   Temp:  (!) 97.5 F (36.4 C)  SpO2: 98%      General: Awake, no distress.  CV:  Good peripheral perfusion.  Resp:  Normal effort.  Abd:  No distention.  Soft and nontender Other:  Cranial nerves appear intact equal strength in arms and legs.  NIH stroke scale of 0 Cerumen noted in the right TM.  ED Results / Procedures / Treatments   Labs (all labs ordered are listed, but only abnormal results are displayed) Labs Reviewed  COMPREHENSIVE  METABOLIC PANEL WITH GFR - Abnormal; Notable for the following components:      Result Value   Glucose, Bld 139 (*)    All other components within normal limits  CBG MONITORING, ED - Abnormal; Notable for the following components:   Glucose-Capillary 138 (*)    All other components within normal limits  CBC WITH DIFFERENTIAL/PLATELET  TROPONIN I (HIGH SENSITIVITY)  TROPONIN I (HIGH SENSITIVITY)     EKG  My interpretation of EKG:  Sinus rate of 58 without any ST elevation or T wave versions, normal intervals  RADIOLOGY I have reviewed the xray personally and interpreted no evidence of any edema   PROCEDURES:  Critical Care performed: Yes, see critical care procedure note(s)  .1-3 Lead EKG Interpretation  Performed by: Ernest Ronal BRAVO, MD Authorized by: Ernest Ronal BRAVO, MD     Interpretation: normal     ECG rate:  60   ECG rate assessment: normal  Rhythm: sinus rhythm     Ectopy: none     Conduction: normal      MEDICATIONS ORDERED IN ED: Medications  iohexol  (OMNIPAQUE ) 350 MG/ML injection 100 mL (75 mLs Intravenous Contrast Given 08/28/24 1301)     IMPRESSION / MDM / ASSESSMENT AND PLAN / ED COURSE  I reviewed the triage vital signs and the nursing notes.   Patient's presentation is most consistent with acute presentation with potential threat to life or bodily function.   Patient comes in with concerns for some right sided numbness but then initially was left-sided.  Nerves right now appear really reassuring.  She does report though some pain underneath her right shoulder blade and with a change in the character of the numbness I will get CT dissection as well as CT head to evaluate for intercranial hemorrhage.  Will check blood work to evaluate for ACS  CBC is reassuring glucose is normal CMP reassuring initial troponin is negative  CT angio was negative CT head negative  On repeat evaluation she denies any numbness or weakness now she does report some  discomfort on the right side of her face.  I do not see any obvious issues here no obvious swelling.  Discussed with her that some of her symptoms do not seem very classic for a stroke.  Discussed with family admission to the hospital for TIA given they did report that she was somewhat weak this morning with some changes in her speech although then they stated that sometimes she gets this way when she is just sleepy.  We discussed pros and cons of admission for TIA workup but patient would literally like to try to go home.  We discussed that if MRI is negative that she could go home given  Patient we had pending MRI, urine and if negative I suspect patient can be discharged home and follow-up outpatient with ENT.  She does report some issues with her ear and she does have some wax buildup and there.   The patient is on the cardiac monitor to evaluate for evidence of arrhythmia and/or significant heart rate changes.      FINAL CLINICAL IMPRESSION(S) / ED DIAGNOSES   Final diagnoses:  Paresthesias  Acute pain of right shoulder     Rx / DC Orders   ED Discharge Orders     None        Note:  This document was prepared using Dragon voice recognition software and may include unintentional dictation errors.   Ernest Ronal BRAVO, MD 08/28/24 [phone]

## 2024-08-28 NOTE — Assessment & Plan Note (Addendum)
-   This is manifested by right upper extremity numbness, vertigo and dizziness. - The patient will be admitted to an observation medically monitored bed.   - We will follow neuro checks q.4 hours for 24 hours.   - The patient will be placed on aspirin .  I will hold off on a second antiplatelet given her age, vertigo and dizziness and possible risk for fall. - Will obtain  2D echo with bubble study .   - A neurology consultation  as well as physical/occupation/speech therapy consults will be obtained in a.m.SABRA - I notified Dr. Matthews about the patient. - The patient will be placed on statin therapy and fasting lipids will be checked.

## 2024-08-28 NOTE — ED Notes (Signed)
 Presents secondary to slurred speech/numbness to right side that family noted this morning. States speech remains a little slurred and right sided numbness resolved. Reports having had dizziness this morning. Oriented x 4 and conversing with family at bedside. Respirations nonlabored. Assisted to bathroom/gait steady. Denies pain. Awaiting bed assignment.

## 2024-08-28 NOTE — H&P (Addendum)
 [place]   PATIENT NAME: [name]    MR#:  [id number]  DATE OF BIRTH:  October 11, 1927  DATE OF ADMISSION:  08/28/2024  PRIMARY CARE PHYSICIAN: Marylynn Verneita CROME, MD   Patient is coming from: Home  REQUESTING/REFERRING PHYSICIAN: Clarine Sharper, MD  CHIEF COMPLAINT:   Chief Complaint  Patient presents with   Numbness    HISTORY OF PRESENT ILLNESS:  [name] is a 89 y.o. Caucasian female female with medical history significant for stage II chronic kidney disease, non-small cell lung cancer, diverticulosis, GERD, hypertension, dyslipidemia, rheumatoid arthritis, and spinal stenosis as well as Raynaud's disease, who presented to the ER with acute onset of right-sided arm numbness with pain under the right shoulder blade.  She was noted to have a change in her speech and was stumbling around this morning.  She went to bed at 9 PM and woke up at 9 AM with her symptoms.  No chest pain or palpitations.  No cough or wheezing or dyspnea.  No dysuria, oliguria or hematuria or flank pain.  No other focal muscle weakness or paresthesias.  She admitted to vertigo and dizziness.  No headache or dizziness or blurred vision.  No new urinary or stool incontinence.  ED Course: When she came to the ER BP was 192/112 with temperature 97.5 and otherwise normal vital signs.  Labs revealed unremarkable CMP and CBC.  High sensitive troponin was 14 and later 15. EKG as reviewed by me : EKG showed normal sinus rhythm rate 58 with left atrial enlargement and incomplete right bundle branch block. Imaging: Noncontrast head CT scan revealed no acute intracranial normalities.  It showed mild cerebral volume loss and periventricular white matter chronic small vessel ischemic disease.  CTA of the chest abdomen pelvis revealed the following: 1. Normal contour and caliber of the thoracic and abdominal aorta. No evidence of aneurysm, dissection, or other acute aortic pathology. Moderate aortic  atherosclerosis. 2. Patent appearing stent of the celiac axis origin. 3. Unchanged irregular nodularity in the peripheral right apex, consistent with treated lung nodule. 4. Coronary artery disease. 5. Sigmoid diverticulosis without evidence of acute diverticulitis.  The patient was given 4 baby aspirin .  She will be admitted to an observation a medical telemetry bed for further evaluation and management. PAST MEDICAL HISTORY:   Past Medical History:  Diagnosis Date   Acute COVID-19 06/04/2021   Allergy    Arthritis    Atherosclerosis    Atherosclerosis of abdominal aorta    Chronic kidney disease    stage 2   CKD (chronic kidney disease) stage 2, GFR 60-89 ml/min    Collagen vascular disease    Compression fracture of thoracic spine, non-traumatic (HCC) 08/19/2015   Seen on plain films October 2016   Diverticulosis 2008   GERD (gastroesophageal reflux disease)    Heart murmur    Hyperlipidemia    Hypertensive urgency 05/04/2022   Lumbar spinal stenosis    Osteoporosis    Ovarian lump    RA (rheumatoid arthritis) (HCC)    Raynaud's disease    Rheumatoid arthritis (HCC)    Spinal stenosis     PAST SURGICAL HISTORY:   Past Surgical History:  Procedure Laterality Date   BREAST SURGERY Left 1965   Benign biopsy   CHOLECYSTECTOMY  2005   [facility]   COLONOSCOPY  2008   Dr. Luellen   EXCISION OF BREAST BIOPSY Left 1965   benign   EYE SURGERY Bilateral  Cataract Extraction with IOL   KYPHOPLASTY N/A 12/17/2016   Procedure: KYPHOPLASTY;  Surgeon: Ozell Flake, MD;  Location: [facility];  Service: Orthopedics;  Laterality: N/A;  l4   LUMBAR LAMINECTOMY  05/09/2017   L2-L5   VISCERAL ANGIOGRAPHY N/A 08/15/2017   Procedure: VISCERAL ANGIOGRAPHY;  Surgeon: Marea Selinda RAMAN, MD;  Location: [facility];  Service: Cardiovascular;  Laterality: N/A;   VISCERAL ANGIOGRAPHY N/A 10/05/2017   Procedure: VISCERAL ANGIOGRAPHY;  Surgeon: Marea Selinda RAMAN, MD;  Location: [facility]  [facility];  Service: Cardiovascular;  Laterality: N/A;   VISCERAL ARTERY INTERVENTION N/A 08/15/2017   Procedure: VISCERAL ARTERY INTERVENTION;  Surgeon: Marea Selinda RAMAN, MD;  Location: [facility];  Service: Cardiovascular;  Laterality: N/A;    SOCIAL HISTORY:   Social History   Tobacco Use   Smoking status: Never   Smokeless tobacco: Never   Tobacco comments:    smoking cessation materials not required  Substance Use Topics   Alcohol use: No    Alcohol/week: 0.0 standard drinks of alcohol    FAMILY HISTORY:   Family History  Problem Relation Age of Onset   Heart disease Mother    Hypertension Father    Heart disease Brother 56       heart attack   Cancer Brother        bladder   Diabetes Sister    Alcohol abuse Sister    Kidney disease Sister    Diabetes Brother    Stroke Brother    Diabetes Maternal Grandmother    Heart disease Maternal Grandmother    Cancer Brother        lung    DRUG ALLERGIES:   Allergies  Allergen Reactions   Ibandronate Other (See Comments)    Achy all over. Flu like S/S   Other Other (See Comments)    Achy all over. Flu like S/S Dysphagia   Prednisone Rash   Actonel [Risedronate] Other (See Comments)    Dysphagia   Amoxicillin  Hives   Pantoprazole  Other (See Comments)    Little blistery bumps   Raloxifene Other (See Comments)    Mood swings   Versed  [Midazolam ] Other (See Comments)    Difficult waking up and memory loss   Doxycycline Rash    REVIEW OF SYSTEMS:   ROS As per history of present illness. All pertinent systems were reviewed above. Constitutional, HEENT, cardiovascular, respiratory, GI, GU, musculoskeletal, neuro, psychiatric, endocrine, integumentary and hematologic systems were reviewed and are otherwise negative/unremarkable except for positive findings mentioned above in the HPI.   MEDICATIONS AT HOME:   Prior to Admission medications   Medication Sig Start Date End Date Taking? Authorizing Provider   acetaminophen  (TYLENOL ) 500 MG tablet Take 500 mg by mouth every 6 (six) hours as needed for mild pain.    [provider]  Alfalfa 500 MG TABS Take 500 mg by mouth daily.    [provider]  Ascorbic Acid (VITAMIN C CR) 500 MG CPCR Take 500 mg by mouth daily.    [provider]  aspirin  EC 81 MG tablet Take 1 tablet (81 mg total) by mouth daily. Swallow whole. 03/29/23   Marylynn Verneita CROME, MD  Calcium  Carbonate (CALCIUM  600 PO) Take by mouth.    [provider]  Cholecalciferol  (VITAMIN D3 PO) Take 500 Units by mouth daily.    [provider]  clotrimazole -betamethasone  (LOTRISONE ) cream Apply topically 2 (two) times daily. 03/14/19   Poulose, Almarie BRAVO, NP  diclofenac sodium (VOLTAREN) 1 % GEL Apply topically as needed. 08/02/19   [provider]  fluticasone  (FLONASE ) 50 MCG/ACT nasal spray Place 1 spray into both nostrils daily.    [provider]  Ginkgo Biloba (GNP GINGKO BILOBA EXTRACT PO) Take 1 tablet by mouth daily.    [provider]  Glucosamine Sulfate 500 MG CAPS Take 500 mg by mouth daily.    [provider]  isosorbide  mononitrate (IMDUR ) 30 MG 24 hr tablet Take 0.5 tablets (15 mg total) by mouth daily. 07/30/24   Darliss Rogue, MD  Lactobacillus (PROBIOTIC ACIDOPHILUS PO) Take 1 capsule by mouth daily.     [provider]  loratadine (CLARITIN) 10 MG tablet Take 10 mg by mouth daily as needed for allergies.    [provider]  Magnesium 400 MG CAPS Take 400 mg by mouth daily.    [provider]  Multiple Vitamin (MULTIVITAMIN) capsule Take 1 capsule by mouth daily.    [provider]  nitroGLYCERIN  (NITROSTAT ) 0.4 MG SL tablet Place 1 tablet (0.4 mg total) under the tongue as needed for chest pain. 07/30/24   Darliss Rogue, MD  nystatin  cream (MYCOSTATIN ) Apply twice daily to corners of lips until redness resolves 09/22/22   Marylynn Verneita CROME, MD  Omega-3 Fatty  Acids (OMEGA 3 PO) Take 520 mg by mouth daily.     [provider]  Pramoxine-HC (HYDROCORTISONE ACE-PRAMOXINE) 2.5-1 % CREA Apply topically as needed.     [provider]  triamcinolone  cream (KENALOG ) 0.1 % Apply 1 Application topically 2 (two) times daily. 07/03/24   Marylynn Verneita CROME, MD      VITAL SIGNS:  Blood pressure (!) 176/85, pulse (!) 56, temperature (!) 97.5 F (36.4 C), temperature source Oral, resp. rate 20, height 5' (1.524 m), weight 43.3 kg, SpO2 98%.  PHYSICAL EXAMINATION:  Physical Exam  GENERAL:  89 y.o.-year-old patient lying in the bed with no acute distress.  EYES: Pupils equal, round, reactive to light and accommodation. No scleral icterus. Extraocular muscles intact.  HEENT: Head atraumatic, normocephalic. Oropharynx and nasopharynx clear.  NECK:  Supple, no jugular venous distention. No thyroid  enlargement, no tenderness.  LUNGS: Normal breath sounds bilaterally, no wheezing, rales,rhonchi or crepitation. No use of accessory muscles of respiration.  CARDIOVASCULAR: Regular rate and rhythm, S1, S2 normal. No murmurs, rubs, or gallops.  ABDOMEN: Soft, nondistended, nontender. Bowel sounds present. No organomegaly or mass.  EXTREMITIES: No pedal edema, cyanosis, or clubbing.  NEUROLOGIC: Cranial nerves II through XII are intact. Muscle strength 5/5 in all extremities. Sensation intact. Gait not checked.  PSYCHIATRIC: The patient is alert and oriented x 3.  Normal affect and good eye contact. SKIN: No obvious rash, lesion, or ulcer.   LABORATORY PANEL:   CBC Recent Labs  Lab 08/28/24 1036  WBC 4.2  HGB 14.2  HCT 43.2  PLT 193   ------------------------------------------------------------------------------------------------------------------  Chemistries  Recent Labs  Lab 08/28/24 1036  NA 138  K 4.0  CL 101  CO2 26  GLUCOSE 139*  BUN 16  CREATININE 0.60  CALCIUM  8.9  AST 37  ALT 18  ALKPHOS 47  BILITOT 0.6    ------------------------------------------------------------------------------------------------------------------  Cardiac Enzymes No results for input(s): TROPONINI in the last 168 hours. ------------------------------------------------------------------------------------------------------------------  RADIOLOGY:  MR BRAIN WO CONTRAST Result Date: 08/28/2024 EXAM: MRI BRAIN WITHOUT CONTRAST 08/28/2024 06:24:36 PM TECHNIQUE: Multiplanar multisequence MRI of the head/brain was performed without the administration of intravenous contrast. COMPARISON: Head CT 08/28/2024 and MRI  09/26/2023. CLINICAL HISTORY: Headache, neuro deficit, right arm numbness. FINDINGS: BRAIN AND VENTRICLES: There is a punctate acute infarct in the left thalamus. Punctate foci of milder diffusion weighted signal abnormality are present in the right internal capsule and inferior right basal ganglia and are indeterminate but could reflect subacute infarcts. No intracranial hemorrhage. No mass. No midline shift. No hydrocephalus. No extra axial fluid collection. Patchy to confluent T2 hyperintensities in the cerebral white matter and pons are similar to the prior MRI and are nonspecific but compatible with moderate chronic small vessel ischemic disease. Chronic lacunar infarcts are noted in the thalami and basal ganglia regions bilaterally. There is mild cerebral atrophy. Major intracranial arterial flow voids are preserved. ORBITS: Bilateral cataract extraction. SINUSES AND MASTOIDS: No acute abnormality. BONES AND SOFT TISSUES: Normal marrow signal. No acute soft tissue abnormality. IMPRESSION: 1. 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 2. Possible punctate subacute infarcts in the right internal capsule and right basal ganglia. 3. Moderate chronic small vessel ischemic disease. Electronically signed by: Dasie Hamburg MD 08/28/2024 07:28 PM EDT RP Workstation: [id number]   CT Angio Chest/Abd/Pel for Dissection W and/or Wo  Contrast Result Date: 08/28/2024 CLINICAL DATA:  Vertigo, dizziness, acute aortic syndrome suspected, history of non-small-cell lung cancer * Tracking Code: BO * EXAM: CT ANGIOGRAPHY CHEST, ABDOMEN AND PELVIS TECHNIQUE: Non-contrast CT of the chest was initially obtained. Multidetector CT imaging through the chest, abdomen and pelvis was performed using the standard protocol during bolus administration of intravenous contrast. Multiplanar reconstructed images and MIPs were obtained and reviewed to evaluate the vascular anatomy. RADIATION DOSE REDUCTION: This exam was performed according to the departmental dose-optimization program which includes automated exposure control, adjustment of the mA and/or kV according to patient size and/or use of iterative reconstruction technique. CONTRAST:  75mL OMNIPAQUE  IOHEXOL  350 MG/ML SOLN COMPARISON:  CT chest, 09/30/2023 FINDINGS: CTA CHEST FINDINGS VASCULAR Aorta: Satisfactory opacification of the aorta. Normal contour and caliber of the thoracic aorta. No evidence of aneurysm, dissection, or other acute aortic pathology. Moderate aortic atherosclerosis. Cardiovascular: No evidence of pulmonary embolism on limited non-tailored examination. Left and right coronary artery calcifications. No pericardial effusion. Review of the MIP images confirms the above findings. NON VASCULAR Mediastinum/Nodes: No enlarged mediastinal, hilar, or axillary lymph nodes. Thyroid  gland, trachea, and esophagus demonstrate no significant findings. Lungs/Pleura: Unchanged irregular nodularity in the peripheral right apex (series 7, image 30, 38). No pleural effusion or pneumothorax. Musculoskeletal: No chest wall abnormality. No acute osseous findings. Review of the MIP images confirms the above findings. CTA ABDOMEN AND PELVIS FINDINGS VASCULAR Normal contour and caliber of the abdominal aorta. No evidence of aneurysm, dissection, or other acute aortic pathology. Standard branching pattern of the  abdominal aorta with solitary bilateral renal arteries. Celiac axis origin stent which appears patent (series 5, image 144). Moderate abdominal aortic atherosclerosis. Review of the MIP images confirms the above findings. NON-VASCULAR Hepatobiliary: No focal liver abnormality is seen. Status post cholecystectomy. No biliary dilatation. Pancreas: Unremarkable. No pancreatic ductal dilatation or surrounding inflammatory changes. Spleen: Normal in size without significant abnormality. Adrenals/Urinary Tract: Adrenal glands are unremarkable. Kidneys are normal, without renal calculi, solid lesion, or hydronephrosis. Bladder is unremarkable. Stomach/Bowel: Stomach is within normal limits. Appendix appears normal. No evidence of bowel wall thickening, distention, or inflammatory changes. Sigmoid diverticulosis. Lymphatic: No enlarged abdominal or pelvic lymph nodes. Reproductive: Calcified uterine fibroid. Other: No abdominal wall hernia or abnormality. No ascites. Musculoskeletal: No acute osseous findings. Osteopenia. Unchanged superior endplate deformities of T11  and L2. Vertebral cement augmentation of L4. IMPRESSION: 1. Normal contour and caliber of the thoracic and abdominal aorta. No evidence of aneurysm, dissection, or other acute aortic pathology. Moderate aortic atherosclerosis. 2. Patent appearing stent of the celiac axis origin. 3. Unchanged irregular nodularity in the peripheral right apex, consistent with treated lung nodule. 4. Coronary artery disease. 5. Sigmoid diverticulosis without evidence of acute diverticulitis. Aortic Atherosclerosis (ICD10-I70.0). Electronically Signed   By: Marolyn JONETTA Jaksch M.D.   On: 08/28/2024 14:46   DG Chest Portable 1 View Result Date: 08/28/2024 EXAM: 1 VIEW(S) XRAY OF THE CHEST 08/28/2024 12:05:00 PM COMPARISON: 07/03/2024 CLINICAL HISTORY: cp. Patient presents with chest pain and vertigo x 2-3 days FINDINGS: LUNGS AND PLEURA: Stable calcified right apical granuloma. No  pulmonary edema. No pleural effusion. No pneumothorax. HEART AND MEDIASTINUM: Aortic atherosclerosis. No acute abnormality of the cardiac and mediastinal silhouettes. BONES AND SOFT TISSUES: Multiple left shoulder calcified bodies. Old right rib fractures. IMPRESSION: 1. No acute cardiopulmonary findings. 2. Stable calcified right apical granuloma. Electronically signed by: Franky Stanford MD 08/28/2024 01:56 PM EDT RP Workstation: [id number]   CT HEAD WO CONTRAST ( ) Result Date: 08/28/2024 EXAM: CT HEAD WITHOUT CONTRAST 08/28/2024 01:23:58 PM TECHNIQUE: CT of the head was performed without the administration of intravenous contrast. Automated exposure control, iterative reconstruction, and/or weight based adjustment of the mA/kV was utilized to reduce the radiation dose to as low as reasonably achievable. COMPARISON: 09/26/2023 CLINICAL HISTORY: Head trauma, intracranial arterial injury suspected. Pt states she has been experiencing vertigo for the past couple of days with dizziness with position changes. No hx of the same. Pt states she went to bed feeling normal and this morning woke up around 9 am with right sided numbness to her face and arm. Pt alert with clear speech. FINDINGS: BRAIN AND VENTRICLES: No acute hemorrhage. No evidence of acute infarct. Mild cerebral volume loss with associated ex vacuo dilatation. Periventricular white matter hypoattenuation, likely chronic small vessel ischemic disease. No extra-axial collection. No mass effect or midline shift. ORBITS: No acute abnormality. SINUSES: No acute abnormality. SOFT TISSUES AND SKULL: No acute soft tissue abnormality. No skull fracture. Vascular calcifications in the intracranial ICAs. IMPRESSION: 1. No acute intracranial abnormality. 2. Mild cerebral volume loss with associated ex vacuo dilatation. 3. Periventricular white matter hypoattenuation, likely chronic small vessel ischemic disease. Electronically signed by: Franky Stanford MD  08/28/2024 01:36 PM EDT RP Workstation: [id number]      IMPRESSION AND PLAN:  Assessment and Plan: * Acute ischemic VBA thalamic stroke, left (HCC) - This is manifested by right upper extremity numbness, vertigo and dizziness. - The patient will be admitted to an observation medically monitored bed.   - We will follow neuro checks q.4 hours for 24 hours.   - The patient will be placed on aspirin .  I will hold off on a second antiplatelet given her age, vertigo and dizziness and possible risk for fall. - Will obtain  2D echo with bubble study .   - A neurology consultation  as well as physical/occupation/speech therapy consults will be obtained in a.m.SABRA - I notified Dr. Matthews about the patient. - The patient will be placed on statin therapy and fasting lipids will be checked.   Coronary artery disease - She will be continued on aspirin  and Imdur .  Essential hypertension - Will continue antihypertensive therapy with permissive parameters.    DVT prophylaxis: Lovenox .  Advanced Care Planning:  Code Status: full code.  Family Communication:  The plan of  care was discussed in details with the patient (and family). I answered all questions. The patient agreed to proceed with the above mentioned plan. Further management will depend upon hospital course. Disposition Plan: Back to previous home environment Consults called: Neurology All the records are reviewed and case discussed with ED provider.  Status is: Observation I certify that at the time of admission, it is my clinical judgment that the patient will require hospital care extending less  than 2 midnights.                            Dispo: The patient is from: Home              Anticipated d/c is to: Home              Patient currently is not medically stable to d/c.              Difficult to place patient: No  Madison DELENA Peaches M.D on 08/28/2024 at 10:30 PM  [facility]   From 7 PM-7 AM, contact  night-coverage [email or web address]  CC: Primary care physician; Marylynn Verneita CROME, MD

## 2024-08-29 ENCOUNTER — Observation Stay

## 2024-08-29 ENCOUNTER — Observation Stay: Admit: 2024-08-29 | Discharge: 2024-08-29 | Disposition: A | Attending: Family Medicine

## 2024-08-29 DIAGNOSIS — I639 Cerebral infarction, unspecified: Secondary | ICD-10-CM | POA: Diagnosis not present

## 2024-08-29 DIAGNOSIS — Z8673 Personal history of transient ischemic attack (TIA), and cerebral infarction without residual deficits: Secondary | ICD-10-CM | POA: Diagnosis not present

## 2024-08-29 DIAGNOSIS — I6782 Cerebral ischemia: Secondary | ICD-10-CM | POA: Diagnosis not present

## 2024-08-29 DIAGNOSIS — I672 Cerebral atherosclerosis: Secondary | ICD-10-CM | POA: Diagnosis not present

## 2024-08-29 DIAGNOSIS — I6523 Occlusion and stenosis of bilateral carotid arteries: Secondary | ICD-10-CM | POA: Diagnosis not present

## 2024-08-29 DIAGNOSIS — I6381 Other cerebral infarction due to occlusion or stenosis of small artery: Secondary | ICD-10-CM | POA: Diagnosis not present

## 2024-08-29 LAB — BASIC METABOLIC PANEL WITH GFR
Anion gap: 8 (ref 5–15)
BUN: 12 mg/dL (ref 8–23)
CO2: 26 mmol/L (ref 22–32)
Calcium: 8.5 mg/dL — ABNORMAL LOW (ref 8.9–10.3)
Chloride: 104 mmol/L (ref 98–111)
Creatinine, Ser: 0.48 mg/dL (ref 0.44–1.00)
GFR, Estimated: >60 mL/min (ref >60)
Glucose, Bld: 86 mg/dL (ref 70–99)
Potassium: 3.4 mmol/L — ABNORMAL LOW (ref 3.5–5.1)
Sodium: 138 mmol/L (ref 135–145)

## 2024-08-29 LAB — LIPID PANEL
Cholesterol: 182 mg/dL (ref 0–200)
HDL: 73 mg/dL (ref >40)
LDL Cholesterol: 97 mg/dL (ref 0–99)
Total CHOL/HDL Ratio: 2.5 ratio
Triglycerides: 58 mg/dL (ref <150)
VLDL: 12 mg/dL (ref 0–40)

## 2024-08-29 LAB — ECHOCARDIOGRAM COMPLETE BUBBLE STUDY
AR max vel: 2.29 cm2
AV Area VTI: 2.16 cm2
AV Area mean vel: 2.17 cm2
AV Mean grad: 3.5 mmHg
AV Peak grad: 6.4 mmHg
Ao pk vel: 1.26 m/s
Area-P 1/2: 2.52 cm2
MV VTI: 2.08 cm2
S' Lateral: 2.5 cm

## 2024-08-29 LAB — CBC
HCT: 43.5 % (ref 36.0–46.0)
Hemoglobin: 14.2 g/dL (ref 12.0–15.0)
MCH: 29.6 pg (ref 26.0–34.0)
MCHC: 32.6 g/dL (ref 30.0–36.0)
MCV: 90.8 fL (ref 80.0–100.0)
Platelets: 187 K/uL (ref 150–400)
RBC: 4.79 MIL/uL (ref 3.87–5.11)
RDW: 13.2 % (ref 11.5–15.5)
WBC: 5.3 K/uL (ref 4.0–10.5)
nRBC: 0 % (ref 0.0–0.2)

## 2024-08-29 LAB — MAGNESIUM: Magnesium: 2.3 mg/dL (ref 1.7–2.4)

## 2024-08-29 LAB — HEMOGLOBIN A1C
Hgb A1c MFr Bld: 6.3 % — ABNORMAL HIGH (ref 4.8–5.6)
Mean Plasma Glucose: 134.11 mg/dL

## 2024-08-29 MED ORDER — IOHEXOL 350 MG/ML SOLN
75.0000 mL | Freq: Once | INTRAVENOUS | Status: AC | PRN
Start: 2024-08-29 — End: 2024-08-29
  Administered 2024-08-29: 75 mL via INTRAVENOUS

## 2024-08-29 MED ORDER — CLOPIDOGREL BISULFATE 75 MG PO TABS
75.0000 mg | ORAL_TABLET | Freq: Every day | ORAL | Status: DC
  Administered 2024-08-29 – 2024-08-30 (×2): 75 mg via ORAL
  Filled 2024-08-29 (×2): qty 1

## 2024-08-29 MED ORDER — ATORVASTATIN CALCIUM 20 MG PO TABS
40.0000 mg | ORAL_TABLET | Freq: Every day | ORAL | Status: DC
  Administered 2024-08-29 – 2024-08-30 (×2): 40 mg via ORAL
  Filled 2024-08-29 (×2): qty 2

## 2024-08-29 MED ORDER — POTASSIUM CHLORIDE 20 MEQ PO PACK
40.0000 meq | PACK | Freq: Once | ORAL | Status: AC
  Administered 2024-08-29: 40 meq via ORAL
  Filled 2024-08-29: qty 2

## 2024-08-29 NOTE — Plan of Care (Signed)
   Problem: Education: Goal: Knowledge of disease or condition will improve Outcome: Progressing

## 2024-08-29 NOTE — Progress Notes (Signed)
*  PRELIMINARY RESULTS* Echocardiogram 2D Echocardiogram has been performed.  [name] 08/29/2024, 7:56 AM

## 2024-08-29 NOTE — Evaluation (Signed)
 Physical Therapy Evaluation Patient Details Name: [name] MRN: [id number] DOB: [birth year] Today's Date: 08/29/2024  History of Present Illness  [name] is a 89 y.o. Caucasian female female with medical history significant for stage II chronic kidney disease, non-small cell lung cancer, diverticulosis, GERD, hypertension, dyslipidemia, rheumatoid arthritis, and spinal stenosis as well as Raynaud's disease, who presented to the ER with acute onset of right-sided arm numbness with pain under the right shoulder blade.  She was noted to have a change in her speech and was stumbling around this morning. MRI reveals Punctate acute infarct in the left thalamus. Possible punctate subacute infarcts in the right internal capsule and right  basal ganglia.  Clinical Impression  Patient is very pleasant, agrees to PT/OT assessment. She is mod I for bed mobility. Transfers with cga. Ambulated 125 feet with IV pole and cga. Also ambulated with single hand held A. No significant difficulties noted. She appears to be close to baseline, just reports weakness from being in bed and not eating. Patient will continue to benefit from skilled PT to improve functional independence, endurance and strength.          If plan is discharge home, recommend the following: A little help with walking and/or transfers;A little help with bathing/dressing/bathroom;Assist for transportation;Help with stairs or ramp for entrance   Can travel by private vehicle    yes    Equipment Recommendations None recommended by PT  Recommendations for Other Services       Functional Status Assessment Patient has had a recent decline in their functional status and demonstrates the ability to make significant improvements in function in a reasonable and predictable amount of time.     Precautions / Restrictions Precautions Precautions: Fall Restrictions Weight Bearing Restrictions Per Provider Order: No       Mobility  Bed Mobility Overal bed mobility: Modified Independent                  Transfers Overall transfer level: Modified independent Equipment used: None                    Ambulation/Gait Ambulation/Gait assistance: Modified independent (Device/Increase time), Contact guard assist Gait Distance (Feet): 125 Feet Assistive device: IV Pole, 1 person hand held assist Gait Pattern/deviations: Step-through pattern Gait velocity: decr     General Gait Details: patient is generally steady with ambulation. Feels close to baseline other than weakness.  Stairs            Wheelchair Mobility     Tilt Bed    Modified Rankin (Stroke Patients Only)       Balance Overall balance assessment: Needs assistance Sitting-balance support: Feet supported Sitting balance-Leahy Scale: Good     Standing balance support: Single extremity supported, During functional activity, Reliant on assistive device for balance Standing balance-Leahy Scale: Fair                               Pertinent Vitals/Pain Pain Assessment Pain Assessment: No/denies pain    Home Living Family/patient expects to be discharged to:: Private residence Living Arrangements: Children Available Help at Discharge: Available 24 hours/day Type of Home: House Home Access: Stairs to enter Entrance Stairs-Rails: Right Entrance Stairs-Number of Steps: 3-4   Home Layout: Laundry or work area in basement Home Equipment: Agricultural consultant (2 wheels);Wheelchair - manual      Prior Function Prior Level of Function : Independent/Modified  Independent             Mobility Comments: no use of AD at baseline, does not drive. Has 2 daughters that are with her just about 24/7 ADLs Comments: independent at baseline     Extremity/Trunk Assessment   Upper Extremity Assessment Upper Extremity Assessment: Defer to OT evaluation    Lower Extremity Assessment Lower Extremity Assessment:  Generalized weakness    Cervical / Trunk Assessment Cervical / Trunk Assessment: Normal  Communication   Communication Communication: No apparent difficulties    Cognition Arousal: Alert Behavior During Therapy: WFL for tasks assessed/performed   PT - Cognitive impairments: No apparent impairments                         Following commands: Intact       Cueing Cueing Techniques: Verbal cues     General Comments      Exercises     Assessment/Plan    PT Assessment Patient needs continued PT services  PT Problem List Decreased strength;Decreased activity tolerance;Decreased balance;Decreased mobility;Decreased knowledge of use of DME;Decreased safety awareness       PT Treatment Interventions DME instruction;Gait training;Stair training;Functional mobility training;Therapeutic activities;Therapeutic exercise;Balance training;Neuromuscular re-education;Patient/family education    PT Goals (Current goals can be found in the Care Plan section)  Acute Rehab PT Goals Patient Stated Goal: return home PT Goal Formulation: With patient/family Time For Goal Achievement: 09/07/24 Potential to Achieve Goals: Good    Frequency Min 2X/week     Co-evaluation               AM-PAC PT 6 Clicks Mobility  Outcome Measure Help needed turning from your back to your side while in a flat bed without using bedrails?: A Little Help needed moving from lying on your back to sitting on the side of a flat bed without using bedrails?: A Little Help needed moving to and from a bed to a chair (including a wheelchair)?: A Little Help needed standing up from a chair using your arms (e.g., wheelchair or bedside chair)?: A Little Help needed to walk in hospital room?: A Little Help needed climbing 3-5 steps with a railing? : A Little 6 Click Score: 18    End of Session Equipment Utilized During Treatment: Gait belt Activity Tolerance: Patient tolerated treatment well Patient  left: in chair;with call bell/phone within reach;with family/visitor present;with chair alarm set Nurse Communication: Mobility status PT Visit Diagnosis: Muscle weakness (generalized) (M62.81);Difficulty in walking, not elsewhere classified (R26.2);Unsteadiness on feet (R26.81)    Time: 9094-9075 PT Time Calculation (min) (ACUTE ONLY): 19 min   Charges:   PT Evaluation $PT Eval Low Complexity: 1 Low   PT General Charges $$ ACUTE PT VISIT: 1 Visit         Temple Ewart, PT, GCS 08/29/24,9:38 AM

## 2024-08-29 NOTE — Evaluation (Addendum)
 Speech Language Pathology Evaluation Patient Details Name: [name] MRN: [id number] DOB: 1927/08/03 Today's Date: 08/29/2024 Time: 8594-8574 SLP Time Calculation (min) (ACUTE ONLY): 20 min  Problem List:  Patient Active Problem List   Diagnosis Date Noted   Acute ischemic VBA thalamic stroke, left (HCC) 08/28/2024   Coronary artery disease 08/28/2024   History of night sweats 07/04/2024   Right lower quadrant abdominal mass 07/04/2024   Night sweats 07/04/2024   Living will in place 07/03/2024   Recurrent falls 10/18/2023   TIA (transient ischemic attack) 09/29/2023   Light headedness 03/30/2023   Muscle weakness (generalized) 03/30/2023   History of myocardial infarction due to demand ischemia 06/04/2021   Trigger finger, right index finger 05/17/2021   Anxiety about health 04/02/2021   History of hepatitis A 09/16/2020   Leg pain, right 08/28/2020   Need for immunization against influenza 08/28/2020   Asymptomatic varicose veins of lower extremity, bilateral 08/28/2020   Hospital discharge follow-up 04/05/2020   Angina of effort 03/26/2020   Essential hypertension 03/25/2020   Osteoporosis, postmenopausal 08/07/2019   Chronic pain in right shoulder 12/06/2018   Primary osteoarthritis of both hands 06/05/2018   Pancreatic cyst 04/10/2018   Myalgia due to statin 04/10/2018   Raynaud's phenomenon without gangrene 04/04/2018   Elevated rheumatoid factor 04/04/2018   Positive ANA (antinuclear antibody) 04/04/2018   Varicose veins of leg with pain, right 09/13/2017   History of lumbar laminectomy for spinal cord decompression 07/19/2017   DNAR (do not attempt resuscitation) 04/19/2017   Compression fracture of L4 vertebra (HCC) 12/10/2016   Celiac artery stenosis 12/06/2016   GERD (gastroesophageal reflux disease) 04/09/2016   Hyperglycemia 12/19/2015   Chronic thoracic spine pain 08/15/2015   Compression fracture of L2 lumbar vertebra (HCC) 08/15/2015    Hyperlipidemia    Atherosclerosis of abdominal aorta    Lumbar spinal stenosis    CAD (coronary artery disease) 06/24/2014   History of adenomatous polyp of colon 06/14/2014   Amaurosis fugax of left eye 01/29/2013   Lung mass 07/13/2012   Irritable bowel syndrome 02/16/2012   Past Medical History:  Past Medical History:  Diagnosis Date   Acute COVID-19 06/04/2021   Allergy    Arthritis    Atherosclerosis    Atherosclerosis of abdominal aorta    Chronic kidney disease    stage 2   CKD (chronic kidney disease) stage 2, GFR 60-89 ml/min    Collagen vascular disease    Compression fracture of thoracic spine, non-traumatic (HCC) 08/19/2015   Seen on plain films October 2016   Diverticulosis 2008   GERD (gastroesophageal reflux disease)    Heart murmur    Hyperlipidemia    Hypertensive urgency 05/04/2022   Lumbar spinal stenosis    Osteoporosis    Ovarian lump    RA (rheumatoid arthritis) (HCC)    Raynaud's disease    Rheumatoid arthritis (HCC)    Spinal stenosis    Past Surgical History:  Past Surgical History:  Procedure Laterality Date   BREAST SURGERY Left 1965   Benign biopsy   CHOLECYSTECTOMY  2005   [facility]   COLONOSCOPY  2008   Dr. Luellen   EXCISION OF BREAST BIOPSY Left 1965   benign   EYE SURGERY Bilateral    Cataract Extraction with IOL   KYPHOPLASTY N/A 12/17/2016   Procedure: KYPHOPLASTY;  Surgeon: Ozell Flake, MD;  Location: [facility] ORS;  Service: Orthopedics;  Laterality: N/A;  l4   LUMBAR LAMINECTOMY  05/09/2017   L2-L5  VISCERAL ANGIOGRAPHY N/A 08/15/2017   Procedure: VISCERAL ANGIOGRAPHY;  Surgeon: Marea Selinda RAMAN, MD;  Location: [facility] INVASIVE CV [facility];  Service: Cardiovascular;  Laterality: N/A;   VISCERAL ANGIOGRAPHY N/A 10/05/2017   Procedure: VISCERAL ANGIOGRAPHY;  Surgeon: Marea Selinda RAMAN, MD;  Location: [facility] INVASIVE CV LAB;  Service: Cardiovascular;  Laterality: N/A;   VISCERAL ARTERY INTERVENTION N/A 08/15/2017   Procedure: VISCERAL ARTERY  INTERVENTION;  Surgeon: Marea Selinda RAMAN, MD;  Location: [facility] INVASIVE CV LAB;  Service: Cardiovascular;  Laterality: N/A;   HPI:  Pt is a 89 y.o. female presented to [facility] ED with transient BUE numbness and questionable slurred speech. MRI showed punctate acute infarct in L thalamus and possible subacute infarcts in R internal capsule / basal ganglia.   PMH significant for HTN, dyslipidemia, RA, spinal stenosis, Raynaud's disease, State II CKD, non-small cell lung cancer, diverticulosis, kyphoplasty.   Assessment / Plan / Recommendation Clinical Impression  Pt seen for cognitive-communication evaluation. Evaluation completed via informal means. Pt demonstrated intact basic cognitive-communicative ability. Very minimal articulatory imprecision and occasionally reduced vocal loudness appreciated which improved with cues to speak louder. Pt 100% intelligible despite. Other than minimal dysarthria, pt appears to be a cognitive-communication baseline per SLP observation as well as pt/daughters report. No f/u ST services warranted at this time. Pt and daughters in agreement with d/c'ing ST at this time.    SLP Assessment  SLP Recommendation/Assessment: Patient does not need any further Speech Language Pathology Services SLP Visit Diagnosis: Cognitive communication deficit (R41.841)     Assistance Recommended at Discharge   (defer to OT/PT)  Functional Status Assessment Patient has not had a recent decline in their functional status        SLP Evaluation Cognition  Overall Cognitive Status: Within Functional Limits for tasks assessed       Comprehension  Auditory Comprehension Overall Auditory Comprehension: Appears within functional limits for tasks assessed    Expression Expression Primary Mode of Expression: Verbal Verbal Expression Overall Verbal Expression: Appears within functional limits for tasks assessed   Oral / Motor  Oral Motor/Sensory Function Overall Oral Motor/Sensory Function:  Within functional limits (?slight L facial droop vs natural asymmetry) Motor Speech Overall Motor Speech: Impaired Respiration: Within functional limits Phonation: Normal Resonance: Within functional limits Articulation: Impaired Intelligibility: Intelligible Motor Planning: Within functional limits Effective Techniques: Increased vocal intensity           Delon Bangs, M.S., CCC-SLP Speech-Language Pathologist [facility] [phone] [place])  Delon CHRISTELLA Bangs 08/29/2024, 2:46 PM

## 2024-08-29 NOTE — Evaluation (Signed)
 Occupational Therapy Evaluation Patient Details Name: [name] MRN: [id number] DOB: October 24, 1927 Today's Date: 08/29/2024   History of Present Illness   Pt is a 89 y.o. female presented to [facility] ED with transient BUE numbness and questionable slurred speech. MRI showed punctate acute infarct in L thalamus and possible subacute infarcts in R internal capsule / basal ganglia.   PMH significant for HTN, dyslipidemia, RA, spinal stenosis, Raynaud's disease, State II CKD, non-small cell lung cancer, diverticulosis, kyphoplasty.     Clinical Impressions Pt admitted with above. Prior to admission, pt lives alone with daughters staying with her 24/7. Pt does not use AD at baseline, does not drive, and is independent in ADL performance. Pt performs bed mobility with supervision, functional STS transfers and mobility CGA without AD 125 ft in hallway, pushing IV pole, no overt LOB noted. Performs LB dressing with MIN A to thread pants over feet and pull up over hips. Pt noted to have decreased proprioception, strength, & FMC/GMC of dominant RUE, but denies changes to sensation or vision. Pt reporting she feels at her baseline, just weak from being in bed. Anticipate pt will require up to MIN A due to fatigue and generalized weakness. Pt would benefit from skilled OT services to address noted impairments and functional limitations (see below for any additional details) in order to maximize safety and independence while minimizing falls risk and caregiver burden. Anticipate the need for follow up [facility] OT services upon acute hospital DC.      If plan is discharge home, recommend the following:   A little help with walking and/or transfers;A little help with bathing/dressing/bathroom;Assistance with cooking/housework;Direct supervision/assist for financial management;Direct supervision/assist for medications management;Assist for transportation;Help with stairs or ramp for entrance     Functional  Status Assessment   Patient has had a recent decline in their functional status and demonstrates the ability to make significant improvements in function in a reasonable and predictable amount of time.     Equipment Recommendations   None recommended by OT (has all necessary DME)      Precautions/Restrictions   Precautions Precautions: Fall Restrictions Weight Bearing Restrictions Per Provider Order: No     Mobility Bed Mobility Overal bed mobility: Needs Assistance Bed Mobility: Supine to Sit     Supine to sit: Supervision          Transfers Overall transfer level: Needs assistance Equipment used: None Transfers: Sit to/from Stand Sit to Stand: Contact guard assist           General transfer comment: CGA for safety. no direct physical assist      Balance Overall balance assessment: Needs assistance Sitting-balance support: Feet supported Sitting balance-Leahy Scale: Good     Standing balance support: Single extremity supported, During functional activity, Reliant on assistive device for balance Standing balance-Leahy Scale: Fair                             ADL either performed or assessed with clinical judgement   ADL Overall ADL's : Needs assistance/impaired Eating/Feeding: Set up;Sitting Eating/Feeding Details (indicate cue type and reason): recliner level                 Lower Body Dressing: Minimal assistance;Sit to/from stand;Cueing for sequencing;Cueing for safety Lower Body Dressing Details (indicate cue type and reason): pt threads incorrect leg through pants, requires cues to recognize and redirect. minA to thread over BLE and to pull up over  hips in standing Toilet Transfer: Minimal assistance;Ambulation;BSC/3in1;Rolling walker (2 wheels);Contact guard assist   Toileting- Clothing Manipulation and Hygiene: Minimal assistance;Sit to/from stand               Vision Baseline Vision/History: 1 Wears glasses Ability  to See in Adequate Light: 0 Adequate Patient Visual Report: No change from baseline       Perception Perception: Impaired Preception Impairment Details: Inattention/Neglect Perception-Other Comments: will continue to assess for R inattention (noted decreased accuracy with functional reach)       Pertinent Vitals/Pain Pain Assessment Pain Assessment: No/denies pain     Extremity/Trunk Assessment Upper Extremity Assessment Upper Extremity Assessment: Generalized weakness;Right hand dominant;RUE deficits/detail RUE Deficits / Details: suspect decreased R proprioception/awarenessand decreased accuracy noted with functional reach pattern. 3+/5 MMT. RUE Coordination: decreased gross motor;decreased fine motor   Lower Extremity Assessment Lower Extremity Assessment: Generalized weakness   Cervical / Trunk Assessment Cervical / Trunk Assessment: Normal   Communication Communication Communication: No apparent difficulties   Cognition Arousal: Alert Behavior During Therapy: WFL for tasks assessed/performed Cognition: No apparent impairments             OT - Cognition Comments: will continue to assess                 Following commands: Intact       Cueing  General Comments   Cueing Techniques: Verbal cues  RN notified of R IV site bleeding, in room to address end of session           Home Living Family/patient expects to be discharged to:: Private residence Living Arrangements: Children Available Help at Discharge: Available 24 hours/day Type of Home: House Home Access: Stairs to enter [employer] of Steps: 3-4 Entrance Stairs-Rails: Right Home Layout: Laundry or work area in basement     [employer] Shower/Tub: Arts development officer Toilet: Handicapped height     Home Equipment: Agricultural consultant (2 wheels);Wheelchair - manual;Shower seat;BSC/3in1          Prior Functioning/Environment Prior Level of Function : Independent/Modified  Independent             Mobility Comments: no use of AD at baseline, does not drive. Has 2 daughters that are with her just about 24/7 ADLs Comments: independent at baseline    OT Problem List: Decreased strength;Decreased range of motion;Decreased activity tolerance;Impaired balance (sitting and/or standing);Impaired UE functional use;Decreased knowledge of precautions;Decreased knowledge of use of DME or AE   OT Treatment/Interventions: Self-care/ADL training;Therapeutic exercise;DME and/or AE instruction;Therapeutic activities;Neuromuscular education;Patient/family education;Balance training      OT Goals(Current goals can be found in the care plan section)   Acute Rehab OT Goals OT Goal Formulation: With patient Time For Goal Achievement: 09/12/24 Potential to Achieve Goals: Good   OT Frequency:  Min 2X/week    Co-evaluation PT/OT/SLP Co-Evaluation/Treatment: Yes Reason for Co-Treatment: For patient/therapist safety;To address functional/ADL transfers   OT goals addressed during session: ADL's and self-care      AM-PAC OT 6 Clicks Daily Activity     Outcome Measure Help from another person eating meals?: None Help from another person taking care of personal grooming?: None Help from another person toileting, which includes using toliet, bedpan, or urinal?: A Little Help from another person bathing (including washing, rinsing, drying)?: A Little Help from another person to put on and taking off regular upper body clothing?: A Little Help from another person to put on and taking off regular lower body clothing?: A  Little 6 Click Score: 20   End of Session Equipment Utilized During Treatment: Gait belt Nurse Communication: Mobility status  Activity Tolerance: Patient tolerated treatment well Patient left: in chair;with call bell/phone within reach;with chair alarm set;with nursing/sitter in room  OT Visit Diagnosis: Muscle weakness (generalized) (M62.81);Other  abnormalities of gait and mobility (R26.89);Unsteadiness on feet (R26.81)                Time: 9092-9074 OT Time Calculation (min): 18 min Charges:  OT General Charges $OT Visit: 1 Visit OT Evaluation $OT Eval Low Complexity: 1 Low  Dhilan Brauer L. [place], OTR/L  08/29/24, 11:41 AM

## 2024-08-29 NOTE — TOC Initial Note (Signed)
 Transition of Care [facility]) - Initial/Assessment Note    Patient Details  Name: [name] MRN: [id number] Date of Birth: [birth year]  Transition of Care [facility]) CM/SW Contact:    Dalia GORMAN Fuse, RN Phone Number: 08/29/2024, 4:29 PM  Clinical Narrative:                  Thearapy recs are for [facility] PT/OT. TOC met with the patient and her 2 daughters in the room. The patient is from home with care from her 2 daughters. She is independent of ADLs and currently doesn't use any equipment with ambulation. The patient has a PCP, Dr Marylynn. The patient and daughters advise she has had home health in the past but they can't remember the company. They are in agreement with continuing with the company. TOC found a note advising the patient was active with [facility]. TOC requested HH PT/OT orders from MD and sent referral to [facility] with [facility]. TOC is awaiting f/u to see if they accept.    Expected Discharge Plan: Home w Home Health Services Barriers to Discharge: Continued Medical Work up   Patient Goals and CMS Choice     Choice offered to / list presented to : Patient, Adult Children      Expected Discharge Plan and Services   Discharge Planning Services: CM Consult   Living arrangements for the past 2 months: Single Family Home                                      Prior Living Arrangements/Services Living arrangements for the past 2 months: Single Family Home Lives with:: Self                   Activities of Daily Living   ADL Screening (condition at time of admission) Independently performs ADLs?: Yes (appropriate for developmental age) Is the patient deaf or have difficulty hearing?: No Does the patient have difficulty seeing, even when wearing glasses/contacts?: No Does the patient have difficulty concentrating, remembering, or making decisions?: No  Permission Sought/Granted                  Emotional Assessment       Orientation: : Oriented  to Self, Oriented to Place, Oriented to  Time, Oriented to Situation   Psych Involvement: No (comment)  Admission diagnosis:  Paresthesias [R20.2] Acute ischemic VBA thalamic stroke, left (HCC) [I63.81] Acute pain of right shoulder [M25.511] Cerebrovascular accident (CVA), unspecified mechanism (HCC) [I63.9] Patient Active Problem List   Diagnosis Date Noted   Acute ischemic VBA thalamic stroke, left (HCC) 08/28/2024   Coronary artery disease 08/28/2024   History of night sweats 07/04/2024   Right lower quadrant abdominal mass 07/04/2024   Night sweats 07/04/2024   Living will in place 07/03/2024   Recurrent falls 10/18/2023   TIA (transient ischemic attack) 09/29/2023   Light headedness 03/30/2023   Muscle weakness (generalized) 03/30/2023   History of myocardial infarction due to demand ischemia 06/04/2021   Trigger finger, right index finger 05/17/2021   Anxiety about health 04/02/2021   History of hepatitis A 09/16/2020   Leg pain, right 08/28/2020   Need for immunization against influenza 08/28/2020   Asymptomatic varicose veins of lower extremity, bilateral 08/28/2020   Hospital discharge follow-up 04/05/2020   Angina of effort 03/26/2020   Essential hypertension 03/25/2020   Osteoporosis, postmenopausal 08/07/2019   Chronic pain in  right shoulder 12/06/2018   Primary osteoarthritis of both hands 06/05/2018   Pancreatic cyst 04/10/2018   Myalgia due to statin 04/10/2018   Raynaud's phenomenon without gangrene 04/04/2018   Elevated rheumatoid factor 04/04/2018   Positive ANA (antinuclear antibody) 04/04/2018   Varicose veins of leg with pain, right 09/13/2017   History of lumbar laminectomy for spinal cord decompression 07/19/2017   DNAR (do not attempt resuscitation) 04/19/2017   Compression fracture of L4 vertebra (HCC) 12/10/2016   Celiac artery stenosis 12/06/2016   GERD (gastroesophageal reflux disease) 04/09/2016   Hyperglycemia 12/19/2015   Chronic thoracic  spine pain 08/15/2015   Compression fracture of L2 lumbar vertebra (HCC) 08/15/2015   Hyperlipidemia    Atherosclerosis of abdominal aorta    Lumbar spinal stenosis    CAD (coronary artery disease) 06/24/2014   History of adenomatous polyp of colon 06/14/2014   Amaurosis fugax of left eye 01/29/2013   Lung mass 07/13/2012   Irritable bowel syndrome 02/16/2012   PCP:  Marylynn Verneita CROME, MD Pharmacy:   [facility] [address], [place] - [address] [place] [address] Phone: [phone] Fax: [phone]     Social Drivers of Health (SDOH) Social History: SDOH Screenings   Food Insecurity: No Food Insecurity (08/29/2024)  Housing: Low Risk  (08/29/2024)  Transportation Needs: No Transportation Needs (08/29/2024)  Utilities: Not At Risk (08/29/2024)  Alcohol Screen: Low Risk  (05/22/2024)  Depression (PHQ2-9): Low Risk  (07/03/2024)  Financial Resource Strain: Low Risk  (07/02/2024)  Physical Activity: Sufficiently Active (07/02/2024)  Recent Concern: Physical Activity - Inactive (05/22/2024)  Social Connections: Moderately Integrated (08/29/2024)  Stress: No Stress Concern Present (07/02/2024)  Tobacco Use: Low Risk  (08/28/2024)  Health Literacy: Inadequate Health Literacy (05/22/2024)   SDOH Interventions:     Readmission Risk Interventions     No data to display

## 2024-08-29 NOTE — Progress Notes (Signed)
 Progress Note    [name]  FMW:[id number] DOB: Sep 15, 1927  DOA: 08/28/2024 PCP: Marylynn Verneita CROME, MD      Brief Narrative:    Medical records reviewed and are as summarized below:  [name] is a 89 y.o. female with medical history significant for stage II chronic kidney disease, non-small cell lung cancer, diverticulosis, GERD, hypertension, dyslipidemia, rheumatoid arthritis, spinal stenosis, Raynaud's disease, who presented to the hospital with acute onset of slurred speech, stumbling around, right facial and arm numbness and pain in the right shoulder blade.  Reportedly, she went to bed around 9 PM the night before admission and woke up the following morning around 9 AM without symptoms.  She was found to have acute stroke.  MRI brain without contrast IMPRESSION: 1. 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 2. Possible punctate subacute infarcts in the right internal capsule and right basal ganglia. 3. Moderate chronic small vessel ischemic disease    Assessment/Plan:   Principal Problem:   Acute ischemic VBA thalamic stroke, left [facility]) Active Problems:   Essential hypertension   Coronary artery disease   Body mass index is 18.64 kg/m.   Punctate acute stroke in the left thalamus, possible punctate subacute infarcts in the right internal capsule and right basal ganglia: Continue low-dose aspirin .  Case discussed with Dr. Matthews, neurologist.  She recommended starting Plavix  for now pending CTA head and neck. Ordered CTA head and neck for further evaluation.. PT and OT recommended home health therapy. Speech therapist recommended regular diet. 2D echo showed EF estimated at 60 to 65%, mild LVH, grade 1 diastolic dysfunction, mild MR, negative bubble study.    CAD: Continue aspirin  and Imdur .  Patient was taking low-dose aspirin  twice a week according to her daughter.   Hypertension: BP stable.   Hyperlipidemia: Start Lipitor 40  mg daily.  Lipid panel showed total cholesterol 182, triglycerides 58, HDL 73, LDL 97.   Type II DM: Hemoglobin A1c 6.3.  NovoLog as needed for hyperglycemia.   Hypokalemia: Potassium 3.4.  Replete potassium and monitor levels.   Diet Order             Diet Heart Room service appropriate? Yes; Fluid consistency: Thin  Diet effective now                                  Consultants: Neurologist  Procedures: None    Medications:    ascorbic acid  500 mg Oral Daily   aspirin  EC  81 mg Oral Daily   atorvastatin   40 mg Oral Daily   calcium  carbonate  1,500 mg Oral Q breakfast   cholecalciferol   500 Units Oral Daily   clopidogrel   75 mg Oral Daily   diclofenac Sodium  4 g Topical QID   enoxaparin  (LOVENOX ) injection  30 mg Subcutaneous Q24H   isosorbide  mononitrate  15 mg Oral Daily   magnesium oxide  400 mg Oral Daily   multivitamin with minerals  1 tablet Oral Daily   omega-3 acid ethyl esters  1 g Oral Daily   potassium chloride  40 mEq Oral Once   Continuous Infusions:     Anti-infectives (From admission, onward)    None              Family Communication/Anticipated D/C date and plan/Code Status   DVT prophylaxis: enoxaparin  (LOVENOX ) injection 30 mg Start: 08/28/24 2200  Code Status: Limited: Do not attempt resuscitation (DNR) -DNR-LIMITED -Do Not Intubate/DNI   Family Communication: Plan [place] with Devere, daughter, at the bedside Disposition Plan: Plan to discharge home   Status is: Observation The patient will require care spanning > 2 midnights and should be moved to inpatient because: Acute stroke       Subjective:   Interval events noted.  She has no complaints.  Speech is better.  Devere  Objective:    Vitals:   08/29/24 0344 08/29/24 0744 08/29/24 1144 08/29/24 1545  BP: (!) 170/86 (!) 197/92 129/79 122/71  Pulse: (!) 56 (!) 58 60 (!) 58  Resp: 18 16 14 17   Temp: 98.2 F (36.8 C) 98 F (36.7 C)  97.6 F (36.4 C) 97.8 F (36.6 C)  TempSrc: Oral Oral Oral   SpO2: 96% 99% 100% 96%  Weight:      Height:       No data found.   Intake/Output Summary (Last 24 hours) at 08/29/2024 1646 Last data filed at 08/29/2024 0900 Gross per 24 hour  Intake 360 ml  Output --  Net 360 ml   Filed Weights   08/28/24 1054  Weight: 43.3 kg    Exam:  GEN: NAD, sitting up in the chair SKIN: Warm and dry EYES: No pallor or icterus ENT: MMM CV: RRR PULM: CTA B ABD: soft, ND, NT, +BS CNS: AAO x 3, non focal EXT: No edema or tenderness        Data Reviewed:   I have personally reviewed following labs and imaging studies:  Labs: Labs show the following:   Basic Metabolic Panel: Recent Labs  Lab 08/28/24 1036 08/29/24 0259  NA 138 138  K 4.0 3.4*  CL 101 104  CO2 26 26  GLUCOSE 139* 86  BUN 16 12  CREATININE 0.60 0.48  CALCIUM  8.9 8.5*   GFR Estimated Creatinine Clearance: 29.4 mL/min (by C-G formula based on SCr of 0.48 mg/dL). Liver Function Tests: Recent Labs  Lab 08/28/24 1036  AST 37  ALT 18  ALKPHOS 47  BILITOT 0.6  PROT 7.1  ALBUMIN 3.7   No results for input(s): LIPASE, AMYLASE in the last 168 hours. No results for input(s): AMMONIA in the last 168 hours. Coagulation profile No results for input(s): INR, PROTIME in the last 168 hours.  CBC: Recent Labs  Lab 08/28/24 1036 08/29/24 0259  WBC 4.2 5.3  NEUTROABS 2.8  --   HGB 14.2 14.2  HCT 43.2 43.5  MCV 90.8 90.8  PLT 193 187   Cardiac Enzymes: No results for input(s): CKTOTAL, CKMB, CKMBINDEX, TROPONINI in the last 168 hours. BNP (last 3 results) No results for input(s): PROBNP in the last 8760 hours. CBG: Recent Labs  Lab 08/28/24 1048  GLUCAP 138*   D-Dimer: No results for input(s): DDIMER in the last 72 hours. Hgb A1c: Recent Labs    08/28/24 1036  HGBA1C 6.3*   Lipid Profile: Recent Labs    08/29/24 0259  CHOL 182  HDL 73  LDLCALC 97  TRIG 58   CHOLHDL 2.5   Thyroid  function studies: No results for input(s): TSH, T4TOTAL, T3FREE, THYROIDAB in the last 72 hours.  Invalid input(s): FREET3 Anemia work up: No results for input(s): VITAMINB12, FOLATE, FERRITIN, TIBC, IRON, RETICCTPCT in the last 72 hours. Sepsis Labs: Recent Labs  Lab 08/28/24 1036 08/29/24 0259  WBC 4.2 5.3    Microbiology No results found for this or any previous visit (from the past  240 hours).  Procedures and diagnostic studies:  ECHOCARDIOGRAM COMPLETE BUBBLE STUDY Result Date: 08/29/2024    ECHOCARDIOGRAM REPORT   Patient Name:   Luanna EISHA CHATTERJEE Date of Exam: 08/29/2024 Medical Rec #:  [id number]           Height:       60.0 in Accession #:    [id number]          Weight:       95.5 lb Date of Birth:  12/15/1926          BSA:          1.363 m Patient Age:    89 years            BP:           170/86 mmHg Patient Gender: F                   HR:           56 bpm. Exam Location:  [facility] Procedure: 2D Echo, Cardiac Doppler, Color Doppler and 3D Echo (Both Spectral            and Color Flow Doppler were utilized during procedure). Indications:     Stroke 434.91 / I63.9  History:         Patient has prior history of Echocardiogram examinations, most                  recent 07/09/2021. Signs/Symptoms:Murmur; Risk                  Factors:Hypertension.  Sonographer:     Christopher Furnace Referring Phys:  [id number] JAN A MANSY Diagnosing Phys: Keller Alluri IMPRESSIONS  1. Left ventricular ejection fraction, by estimation, is 60 to 65%. The left ventricle has normal function. The left ventricle has no regional wall motion abnormalities. There is mild left ventricular hypertrophy. Left ventricular diastolic parameters are consistent with Grade I diastolic dysfunction (impaired relaxation).  2. Right ventricular systolic function is normal. The right ventricular size is normal.  3. The mitral valve is normal in structure. Mild mitral valve regurgitation.  4. The  aortic valve is tricuspid. There is mild thickening of the aortic valve. Aortic valve regurgitation is trivial.  5. Agitated saline contrast bubble study was negative, with no evidence of any interatrial shunt. FINDINGS  Left Ventricle: Left ventricular ejection fraction, by estimation, is 60 to 65%. The left ventricle has normal function. The left ventricle has no regional wall motion abnormalities. The left ventricular internal cavity size was normal in size. There is  mild left ventricular hypertrophy. Left ventricular diastolic parameters are consistent with Grade I diastolic dysfunction (impaired relaxation). Right Ventricle: The right ventricular size is normal. No increase in right ventricular wall thickness. Right ventricular systolic function is normal. Left Atrium: Left atrial size was normal in size. Right Atrium: Right atrial size was normal in size. Pericardium: There is no evidence of pericardial effusion. Mitral Valve: The mitral valve is normal in structure. Mild mitral valve regurgitation. MV peak gradient, 5.5 mmHg. The mean mitral valve gradient is 2.0 mmHg. Tricuspid Valve: The tricuspid valve is normal in structure. Tricuspid valve regurgitation is trivial. Aortic Valve: The aortic valve is tricuspid. There is mild thickening of the aortic valve. Aortic valve regurgitation is trivial. Aortic valve mean gradient measures 3.5 mmHg. Aortic valve peak gradient measures 6.4 mmHg. Aortic valve area, by VTI measures 2.16 cm. Pulmonic Valve: The pulmonic valve was not well visualized.  Pulmonic valve regurgitation is not visualized. Aorta: The aortic root is normal in size and structure. IAS/Shunts: The atrial septum is grossly normal. Agitated saline contrast bubble study was negative, with no evidence of any interatrial shunt.  LEFT VENTRICLE PLAX 2D LVIDd:         4.30 cm   Diastology LVIDs:         2.50 cm   LV e' medial:    5.77 cm/s LV PW:         1.10 cm   LV E/e' medial:  13.9 LV IVS:         1.00 cm   LV e' lateral:   8.81 cm/s LVOT diam:     2.00 cm   LV E/e' lateral: 9.1 LV SV:         69 LV SV Index:   51 LVOT Area:     3.14 cm  RIGHT VENTRICLE RV Basal diam:  3.40 cm RV Mid diam:    2.60 cm RV S prime:     12.70 cm/s TAPSE (M-mode): 2.0 cm LEFT ATRIUM             Index        RIGHT ATRIUM           Index LA diam:        3.20 cm 2.35 cm/m   RA Area:     11.80 cm LA Vol (A2C):   28.4 ml 20.83 ml/m  RA Volume:   26.30 ml  19.29 ml/m LA Vol (A4C):   21.3 ml 15.63 ml/m LA Biplane Vol: 25.8 ml 18.93 ml/m  AORTIC VALVE AV Area (Vmax):    2.29 cm AV Area (Vmean):   2.17 cm AV Area (VTI):     2.16 cm AV Vmax:           126.00 cm/s AV Vmean:          85.000 cm/s AV VTI:            0.322 m AV Peak Grad:      6.4 mmHg AV Mean Grad:      3.5 mmHg LVOT Vmax:         91.70 cm/s LVOT Vmean:        58.700 cm/s LVOT VTI:          0.221 m LVOT/AV VTI ratio: 0.69  AORTA Ao Root diam: 2.80 cm MITRAL VALVE               TRICUSPID VALVE MV Area (PHT): 2.52 cm    TR Peak grad:   25.6 mmHg MV Area VTI:   2.08 cm    TR Vmax:        253.00 cm/s MV Peak grad:  5.5 mmHg MV Mean grad:  2.0 mmHg    SHUNTS MV Vmax:       1.17 m/s    Systemic VTI:  0.22 m MV Vmean:      65.5 cm/s   Systemic Diam: 2.00 cm MV Decel Time: 301 msec MV E velocity: 80.20 cm/s MV A velocity: 95.90 cm/s MV E/A ratio:  0.84 Keller Paterson Electronically signed by Keller Paterson Signature Date/Time: 08/29/2024/11:48:45 AM    Final    MR BRAIN WO CONTRAST Result Date: 08/28/2024 EXAM: MRI BRAIN WITHOUT CONTRAST 08/28/2024 06:24:36 PM TECHNIQUE: Multiplanar multisequence MRI of the head/brain was performed without the administration of intravenous contrast. COMPARISON: Head CT 08/28/2024 and MRI 09/26/2023. CLINICAL HISTORY: Headache, neuro deficit, right arm  numbness. FINDINGS: BRAIN AND VENTRICLES: There is a punctate acute infarct in the left thalamus. Punctate foci of milder diffusion weighted signal abnormality are present in the right  internal capsule and inferior right basal ganglia and are indeterminate but could reflect subacute infarcts. No intracranial hemorrhage. No mass. No midline shift. No hydrocephalus. No extra axial fluid collection. Patchy to confluent T2 hyperintensities in the cerebral white matter and pons are similar to the prior MRI and are nonspecific but compatible with moderate chronic small vessel ischemic disease. Chronic lacunar infarcts are noted in the thalami and basal ganglia regions bilaterally. There is mild cerebral atrophy. Major intracranial arterial flow voids are preserved. ORBITS: Bilateral cataract extraction. SINUSES AND MASTOIDS: No acute abnormality. BONES AND SOFT TISSUES: Normal marrow signal. No acute soft tissue abnormality. IMPRESSION: 1. 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 2. Possible punctate subacute infarcts in the right internal capsule and right basal ganglia. 3. Moderate chronic small vessel ischemic disease. Electronically signed by: Dasie Hamburg MD 08/28/2024 07:28 PM EDT RP Workstation: [id number]   CT Angio Chest/Abd/Pel for Dissection W and/or Wo Contrast Result Date: 08/28/2024 CLINICAL DATA:  Vertigo, dizziness, acute aortic syndrome suspected, history of non-small-cell lung cancer * Tracking Code: BO * EXAM: CT ANGIOGRAPHY CHEST, ABDOMEN AND PELVIS TECHNIQUE: Non-contrast CT of the chest was initially obtained. Multidetector CT imaging through the chest, abdomen and pelvis was performed using the standard protocol during bolus administration of intravenous contrast. Multiplanar reconstructed images and MIPs were obtained and reviewed to evaluate the vascular anatomy. RADIATION DOSE REDUCTION: This exam was performed according to the departmental dose-optimization program which includes automated exposure control, adjustment of the mA and/or kV according to patient size and/or use of iterative reconstruction technique. CONTRAST:  75mL OMNIPAQUE  IOHEXOL  350 MG/ML SOLN  COMPARISON:  CT chest, 09/30/2023 FINDINGS: CTA CHEST FINDINGS VASCULAR Aorta: Satisfactory opacification of the aorta. Normal contour and caliber of the thoracic aorta. No evidence of aneurysm, dissection, or other acute aortic pathology. Moderate aortic atherosclerosis. Cardiovascular: No evidence of pulmonary embolism on limited non-tailored examination. Left and right coronary artery calcifications. No pericardial effusion. Review of the MIP images confirms the above findings. NON VASCULAR Mediastinum/Nodes: No enlarged mediastinal, hilar, or axillary lymph nodes. Thyroid  gland, trachea, and esophagus demonstrate no significant findings. Lungs/Pleura: Unchanged irregular nodularity in the peripheral right apex (series 7, image 30, 38). No pleural effusion or pneumothorax. Musculoskeletal: No chest wall abnormality. No acute osseous findings. Review of the MIP images confirms the above findings. CTA ABDOMEN AND PELVIS FINDINGS VASCULAR Normal contour and caliber of the abdominal aorta. No evidence of aneurysm, dissection, or other acute aortic pathology. Standard branching pattern of the abdominal aorta with solitary bilateral renal arteries. Celiac axis origin stent which appears patent (series 5, image 144). Moderate abdominal aortic atherosclerosis. Review of the MIP images confirms the above findings. NON-VASCULAR Hepatobiliary: No focal liver abnormality is seen. Status post cholecystectomy. No biliary dilatation. Pancreas: Unremarkable. No pancreatic ductal dilatation or surrounding inflammatory changes. Spleen: Normal in size without significant abnormality. Adrenals/Urinary Tract: Adrenal glands are unremarkable. Kidneys are normal, without renal calculi, solid lesion, or hydronephrosis. Bladder is unremarkable. Stomach/Bowel: Stomach is within normal limits. Appendix appears normal. No evidence of bowel wall thickening, distention, or inflammatory changes. Sigmoid diverticulosis. Lymphatic: No enlarged  abdominal or pelvic lymph nodes. Reproductive: Calcified uterine fibroid. Other: No abdominal wall hernia or abnormality. No ascites. Musculoskeletal: No acute osseous findings. Osteopenia. Unchanged superior endplate deformities of T11 and L2. Vertebral cement augmentation of L4.  IMPRESSION: 1. Normal contour and caliber of the thoracic and abdominal aorta. No evidence of aneurysm, dissection, or other acute aortic pathology. Moderate aortic atherosclerosis. 2. Patent appearing stent of the celiac axis origin. 3. Unchanged irregular nodularity in the peripheral right apex, consistent with treated lung nodule. 4. Coronary artery disease. 5. Sigmoid diverticulosis without evidence of acute diverticulitis. Aortic Atherosclerosis (ICD10-I70.0). Electronically Signed   By: Marolyn JONETTA Jaksch M.D.   On: 08/28/2024 14:46   DG Chest Portable 1 View Result Date: 08/28/2024 EXAM: 1 VIEW(S) XRAY OF THE CHEST 08/28/2024 12:05:00 PM COMPARISON: 07/03/2024 CLINICAL HISTORY: cp. Patient presents with chest pain and vertigo x 2-3 days FINDINGS: LUNGS AND PLEURA: Stable calcified right apical granuloma. No pulmonary edema. No pleural effusion. No pneumothorax. HEART AND MEDIASTINUM: Aortic atherosclerosis. No acute abnormality of the cardiac and mediastinal silhouettes. BONES AND SOFT TISSUES: Multiple left shoulder calcified bodies. Old right rib fractures. IMPRESSION: 1. No acute cardiopulmonary findings. 2. Stable calcified right apical granuloma. Electronically signed by: Franky Stanford MD 08/28/2024 01:56 PM EDT RP Workstation: [id number]   CT HEAD WO CONTRAST ( ) Result Date: 08/28/2024 EXAM: CT HEAD WITHOUT CONTRAST 08/28/2024 01:23:58 PM TECHNIQUE: CT of the head was performed without the administration of intravenous contrast. Automated exposure control, iterative reconstruction, and/or weight based adjustment of the mA/kV was utilized to reduce the radiation dose to as low as reasonably achievable. COMPARISON:  09/26/2023 CLINICAL HISTORY: Head trauma, intracranial arterial injury suspected. Pt states she has been experiencing vertigo for the past couple of days with dizziness with position changes. No hx of the same. Pt states she went to bed feeling normal and this morning woke up around 9 am with right sided numbness to her face and arm. Pt alert with clear speech. FINDINGS: BRAIN AND VENTRICLES: No acute hemorrhage. No evidence of acute infarct. Mild cerebral volume loss with associated ex vacuo dilatation. Periventricular white matter hypoattenuation, likely chronic small vessel ischemic disease. No extra-axial collection. No mass effect or midline shift. ORBITS: No acute abnormality. SINUSES: No acute abnormality. SOFT TISSUES AND SKULL: No acute soft tissue abnormality. No skull fracture. Vascular calcifications in the intracranial ICAs. IMPRESSION: 1. No acute intracranial abnormality. 2. Mild cerebral volume loss with associated ex vacuo dilatation. 3. Periventricular white matter hypoattenuation, likely chronic small vessel ischemic disease. Electronically signed by: Franky Stanford MD 08/28/2024 01:36 PM EDT RP Workstation: [id number]               LOS: 0 days   Neosha Switalski  [facility]   Pager on [email or web address]. If 7PM-7AM, please contact night-coverage at [email or web address]     08/29/2024, 4:46 PM

## 2024-08-30 ENCOUNTER — Observation Stay

## 2024-08-30 ENCOUNTER — Other Ambulatory Visit: Payer: Self-pay

## 2024-08-30 ENCOUNTER — Observation Stay (HOSPITAL_BASED_OUTPATIENT_CLINIC_OR_DEPARTMENT_OTHER)
Admit: 2024-08-30 | Discharge: 2024-08-30 | Disposition: A | Discharge status: Home / Self Care | Attending: Nurse Practitioner | Admitting: Nurse Practitioner

## 2024-08-30 DIAGNOSIS — R29701 NIHSS score 1: Secondary | ICD-10-CM

## 2024-08-30 DIAGNOSIS — I6389 Other cerebral infarction: Secondary | ICD-10-CM | POA: Diagnosis not present

## 2024-08-30 DIAGNOSIS — I639 Cerebral infarction, unspecified: Secondary | ICD-10-CM

## 2024-08-30 DIAGNOSIS — I6381 Other cerebral infarction due to occlusion or stenosis of small artery: Secondary | ICD-10-CM | POA: Diagnosis not present

## 2024-08-30 LAB — BASIC METABOLIC PANEL WITH GFR
Anion gap: 10 (ref 5–15)
BUN: 13 mg/dL (ref 8–23)
CO2: 26 mmol/L (ref 22–32)
Calcium: 8.9 mg/dL (ref 8.9–10.3)
Chloride: 102 mmol/L (ref 98–111)
Creatinine, Ser: 0.46 mg/dL (ref 0.44–1.00)
GFR, Estimated: >60 mL/min (ref >60)
Glucose, Bld: 104 mg/dL — ABNORMAL HIGH (ref 70–99)
Potassium: 3.8 mmol/L (ref 3.5–5.1)
Sodium: 138 mmol/L (ref 135–145)

## 2024-08-30 MED ORDER — FLUTICASONE PROPIONATE 50 MCG/ACT NA SUSP: 1.0000 | Freq: Every day | NASAL | Status: DC | PRN

## 2024-08-30 MED ORDER — CLOPIDOGREL BISULFATE 75 MG PO TABS
75.0000 mg | ORAL_TABLET | Freq: Every day | ORAL | 0 refills | Status: DC
  Filled 2024-08-30: qty 30, 30d supply, fill #0

## 2024-08-30 MED ORDER — ATORVASTATIN CALCIUM 40 MG PO TABS
40.0000 mg | ORAL_TABLET | Freq: Every day | ORAL | 0 refills | Status: DC
  Filled 2024-08-30: qty 30, 30d supply, fill #0

## 2024-08-30 NOTE — Care Management Obs Status (Signed)
 MEDICARE OBSERVATION STATUS NOTIFICATION   Patient Details  Name: [name] MRN: [id number] Date of Birth: 1927/09/21   Medicare Observation Status Notification Given:  [name] BRANDY CHRISTIANE LELON, CMA 08/30/2024, 12:18 PM

## 2024-08-30 NOTE — Progress Notes (Signed)
 PT Cancellation Note  Patient Details Name: Tanith Dagostino MRN: [id number] DOB: [birth year]   Cancelled Treatment:    Reason Eval/Treat Not Completed: Patient at procedure or test/unavailable, will attempt to see pt at a future date/time as medically appropriate.    CHARM Glendia Bertin PT, DPT 08/30/24, 1:53 PM

## 2024-08-30 NOTE — Progress Notes (Signed)
 Occupational Therapy Treatment Patient Details Name: [name] MRN: [id number] DOB: 1927/04/03 Today's Date: 08/30/2024   History of present illness Pt is a 89 y.o. female presented to [facility] ED with transient BUE numbness and questionable slurred speech. MRI showed punctate acute infarct in L thalamus and possible subacute infarcts in R internal capsule / basal ganglia.   PMH significant for HTN, dyslipidemia, RA, spinal stenosis, Raynaud's disease, State II CKD, non-small cell lung cancer, diverticulosis, kyphoplasty.   OT comments  [name] was seen for OT treatment on this date. Upon arrival to room pt seated in chair, agreeable to tx. Pt requires MIN cues + SBA standign grooming tasks. CGA funcitonal mobility ~150 ft no AD use. Provided green theraputty for exercies with handout provided. Reviewed FMC exercises including removing beads from putty, crumpling kleenex, and picking up coins from table. Mild L inattention noted with difficulty scanning to left side of sink for ADL items. Pt endorses numbness in R fingertips. Pt making good progress toward goals, will continue to follow POC. Discharge recommendation remains appropriate.        If plan is discharge home, recommend the following:  A little help with walking and/or transfers;A little help with bathing/dressing/bathroom;Assistance with cooking/housework;Direct supervision/assist for financial management;Direct supervision/assist for medications management;Assist for transportation;Help with stairs or ramp for entrance   Equipment Recommendations  None recommended by OT    Recommendations for Other Services      Precautions / Restrictions Precautions Precautions: Fall Recall of Precautions/Restrictions: Intact Restrictions Weight Bearing Restrictions Per Provider Order: No       Mobility Bed Mobility               General bed mobility comments: not tested    Transfers Overall transfer level: Needs  assistance Equipment used: None Transfers: Sit to/from Stand Sit to Stand: Contact guard assist                 Balance Overall balance assessment: Needs assistance Sitting-balance support: Feet supported Sitting balance-Leahy Scale: Good     Standing balance support: No upper extremity supported, During functional activity Standing balance-Leahy Scale: Fair                             ADL either performed or assessed with clinical judgement   ADL Overall ADL's : Needs assistance/impaired                                       General ADL Comments: MIN cues + SBA standign grooming tasks. CGA funcitonal mobility ~150 ft no AD use.      Communication Communication Communication: Impaired Factors Affecting Communication: Hearing impaired   Cognition Arousal: Alert Behavior During Therapy: WFL for tasks assessed/performed Cognition: No apparent impairments                                        Cueing      Exercises Exercises: Other exercises Other Exercises Other Exercises: provided green theraputty for exercies with handout provided. Reviewed FMC exercises including removing beads from putty, crumpling kleenex, and picking up coins from table    Shoulder Instructions       General Comments      Pertinent Vitals/ Pain  Pain Assessment Pain Assessment: No/denies pain   Frequency  Min 2X/week        Progress Toward Goals  OT Goals(current goals can now be found in the care plan section)  Progress towards OT goals: Progressing toward goals  Acute Rehab OT Goals OT Goal Formulation: With patient Time For Goal Achievement: 09/12/24 Potential to Achieve Goals: Good ADL Goals Pt Will Perform Grooming: with modified independence;standing Pt Will Perform Lower Body Dressing: with modified independence;sit to/from stand Pt Will Transfer to Toilet: with modified independence;bedside commode;ambulating Pt  Will Perform Toileting - Clothing Manipulation and hygiene: with modified independence;sitting/lateral leans;sit to/from stand  Plan      Co-evaluation                 AM-PAC OT 6 Clicks Daily Activity     Outcome Measure   Help from another person eating meals?: None Help from another person taking care of personal grooming?: None Help from another person toileting, which includes using toliet, bedpan, or urinal?: A Little Help from another person bathing (including washing, rinsing, drying)?: A Little Help from another person to put on and taking off regular upper body clothing?: A Little Help from another person to put on and taking off regular lower body clothing?: A Little 6 Click Score: 20    End of Session Equipment Utilized During Treatment: Gait belt  OT Visit Diagnosis: Muscle weakness (generalized) (M62.81);Other abnormalities of gait and mobility (R26.89);Unsteadiness on feet (R26.81)   Activity Tolerance Patient tolerated treatment well   Patient Left in chair;with call bell/phone within reach;with family/visitor present   Nurse Communication Mobility status        Time: 9148-9074 OT Time Calculation (min): 34 min  Charges: OT General Charges $OT Visit: 1 Visit OT Treatments $Self Care/Home Management : 8-22 mins $Therapeutic Exercise: 8-22 mins  [name], M.S. OTR/L  08/30/24, 10:41 AM  ascom [phone]

## 2024-08-30 NOTE — Plan of Care (Signed)
  Problem: Education: Goal: Knowledge of patient specific risk factors will improve (DELETE if not current risk factor) Outcome: Progressing   Problem: Ischemic Stroke/TIA Tissue Perfusion: Goal: Complications of ischemic stroke/TIA will be minimized Outcome: Progressing   Problem: Health Behavior/Discharge Planning: Goal: Goals will be collaboratively established with patient/family Outcome: Progressing   Problem: Nutrition: Goal: Dietary intake will improve Outcome: Progressing

## 2024-08-30 NOTE — TOC Progression Note (Signed)
 Transition of Care [facility]) - Progression Note    Patient Details  Name: [name] MRN: [id number] Date of Birth: [birth year]  Transition of Care [facility]) CM/SW Contact  Dalia GORMAN Fuse, RN Phone Number: 08/30/2024, 10:27 AM  Clinical Narrative:     TOC received a message from B[place]by at [facility]. She accepted the [facility] Referral.  Expected Discharge Plan: Home w Home Health Services Barriers to Discharge: Continued Medical Work up               Expected Discharge Plan and Services   Discharge Planning Services: CM Consult   Living arrangements for the past 2 months: Single Family Home                                       Social Drivers of Health (SDOH) Interventions SDOH Screenings   Food Insecurity: No Food Insecurity (08/29/2024)  Housing: Low Risk  (08/29/2024)  Transportation Needs: No Transportation Needs (08/29/2024)  Utilities: Not At Risk (08/29/2024)  Alcohol Screen: Low Risk  (05/22/2024)  Depression (PHQ2-9): Low Risk  (07/03/2024)  Financial Resource Strain: Low Risk  (07/02/2024)  Physical Activity: Sufficiently Active (07/02/2024)  Recent Concern: Physical Activity - Inactive (05/22/2024)  Social Connections: Moderately Integrated (08/29/2024)  Stress: No Stress Concern Present (07/02/2024)  Tobacco Use: Low Risk  (08/28/2024)  Health Literacy: Inadequate Health Literacy (05/22/2024)    Readmission Risk Interventions     No data to display

## 2024-08-30 NOTE — Consult Note (Signed)
 NEUROLOGY CONSULT NOTE   Date of service: August 30, 2024 Patient Name: [name] MRN:  [id number] DOB:  May 04, 1927 Chief Complaint: acute stroke Requesting Provider: Jens Durand, MD  History of Present Illness  [name] is a 89 y.o. female with hx of stage II CKD, and as CLC, hypertension, hyperlipidemia, RA, spinal stenosis who presented to the ED with acute onset RUE numbness. LKW the night before bed. She woke up at 9am 08/28/24 with the numbness and had no other neurologic deficits. MRI brain showed punctate acute infarct L thalamus and small likely subacute infarcts R internal capsule and R BG. Her RUE numbness has improved but not resolved. In addition she reports a few brief spells (lasting seconds) of feeling like room is spinning which are triggered by change in head position.   NIHSS components Score: Comment  1a Level of Conscious 0[x]  1[]  2[]  3[]      1b LOC Questions 0[x]  1[]  2[]       1c LOC Commands 0[x]  1[]  2[]       2 Best Gaze 0[x]  1[]  2[]       3 Visual 0[x]  1[]  2[]  3[]      4 Facial Palsy 0[x]  1[]  2[]  3[]      5a Motor Arm - left 0[x]  1[]  2[]  3[]  4[]  UN[]    5b Motor Arm - Right 0[x]  1[]  2[]  3[]  4[]  UN[]    6a Motor Leg - Left 0[x]  1[]  2[]  3[]  4[]  UN[]    6b Motor Leg - Right 0[x]  1[]  2[]  3[]  4[]  UN[]    7 Limb Ataxia 0[x]  1[]  2[]  3[]  UN[]     8 Sensory 0[]  1[x]  2[]  UN[]      9 Best Language 0[x]  1[]  2[]  3[]      10 Dysarthria 0[x]  1[]  2[]  UN[]      11 Extinct. and Inattention 0[x]  1[]  2[]       TOTAL:  1      ROS   Comprehensive ROS performed and pertinent positives documented in HPI   Past History   Past Medical History:  Diagnosis Date   Acute COVID-19 06/04/2021   Allergy    Arthritis    Atherosclerosis    Atherosclerosis of abdominal aorta    Chronic kidney disease    stage 2   CKD (chronic kidney disease) stage 2, GFR 60-89 ml/min    Collagen vascular disease    Compression fracture of thoracic spine, non-traumatic (HCC) 08/19/2015    Seen on plain films October 2016   Diverticulosis 2008   GERD (gastroesophageal reflux disease)    Heart murmur    Hyperlipidemia    Hypertensive urgency 05/04/2022   Lumbar spinal stenosis    Osteoporosis    Ovarian lump    RA (rheumatoid arthritis) (HCC)    Raynaud's disease    Rheumatoid arthritis (HCC)    Spinal stenosis     Past Surgical History:  Procedure Laterality Date   BREAST SURGERY Left 1965   Benign biopsy   CHOLECYSTECTOMY  2005   Nicholson   COLONOSCOPY  2008   Dr. Luellen   EXCISION OF BREAST BIOPSY Left 1965   benign   EYE SURGERY Bilateral    Cataract Extraction with IOL   KYPHOPLASTY N/A 12/17/2016   Procedure: KYPHOPLASTY;  Surgeon: Ozell Flake, MD;  Location: [facility] ORS;  Service: Orthopedics;  Laterality: N/A;  l4   LUMBAR LAMINECTOMY  05/09/2017   L2-L5   VISCERAL ANGIOGRAPHY N/A 08/15/2017   Procedure: VISCERAL ANGIOGRAPHY;  Surgeon: Marea Selinda RAMAN, MD;  Location: [facility]  INVASIVE CV LAB;  Service: Cardiovascular;  Laterality: N/A;   VISCERAL ANGIOGRAPHY N/A 10/05/2017   Procedure: VISCERAL ANGIOGRAPHY;  Surgeon: Marea Selinda RAMAN, MD;  Location: [facility] INVASIVE CV LAB;  Service: Cardiovascular;  Laterality: N/A;   VISCERAL ARTERY INTERVENTION N/A 08/15/2017   Procedure: VISCERAL ARTERY INTERVENTION;  Surgeon: Marea Selinda RAMAN, MD;  Location: [facility] INVASIVE CV LAB;  Service: Cardiovascular;  Laterality: N/A;    Family History: Family History  Problem Relation Age of Onset   Heart disease Mother    Hypertension Father    Heart disease Brother 27       heart attack   Cancer Brother        bladder   Diabetes Sister    Alcohol abuse Sister    Kidney disease Sister    Diabetes Brother    Stroke Brother    Diabetes Maternal Grandmother    Heart disease Maternal Grandmother    Cancer Brother        lung    Social History  reports that she has never smoked. She has never used smokeless tobacco. She reports that she does not drink alcohol and does not use  drugs.  Allergies  Allergen Reactions   Ibandronate Other (See Comments)    Achy all over. Flu like S/S   Other Other (See Comments)    Achy all over. Flu like S/S Dysphagia   Prednisone Rash   Actonel [Risedronate] Other (See Comments)    Dysphagia   Amoxicillin  Hives   Pantoprazole  Other (See Comments)    Little blistery bumps   Raloxifene Other (See Comments)    Mood swings   Versed  [Midazolam ] Other (See Comments)    Difficult waking up and memory loss   Doxycycline Rash    Medications   Current Facility-Administered Medications:    acetaminophen  (TYLENOL ) tablet 650 mg, 650 mg, Oral, Q6H PRN **OR** acetaminophen  (TYLENOL ) suppository 650 mg, 650 mg, Rectal, Q6H PRN, Mansy, Jan A, MD   ascorbic acid (VITAMIN C) tablet 500 mg, 500 mg, Oral, Daily, Mansy, Jan A, MD, 500 mg at 08/30/24 9166   aspirin  EC tablet 81 mg, 81 mg, Oral, Daily, Mansy, Jan A, MD, 81 mg at 08/30/24 9166   atorvastatin  (LIPITOR) tablet 40 mg, 40 mg, Oral, Daily, Ayiku, Bernard, MD, 40 mg at 08/30/24 9166   calcium  carbonate (TUMS - dosed in mg elemental calcium ) chewable tablet 600 mg of elemental calcium , 1,500 mg, Oral, Q breakfast, Mansy, Jan A, MD, 600 mg of elemental calcium  at 08/30/24 9166   cholecalciferol  (VITAMIN D3) 25 MCG (1000 UNIT) tablet 500 Units, 500 Units, Oral, Daily, Mansy, Jan A, MD, 500 Units at 08/30/24 9166   clopidogrel  (PLAVIX ) tablet 75 mg, 75 mg, Oral, Daily, Jens Durand, MD, 75 mg at 08/30/24 9166   diclofenac Sodium (VOLTAREN) 1 % topical gel 4 g, 4 g, Topical, QID, Lenon Elsie HERO, [facility], 4 g at 08/29/24 2145   enoxaparin  (LOVENOX ) injection 30 mg, 30 mg, Subcutaneous, Q24H, Mansy, Jan A, MD, 30 mg at 08/29/24 2145   fluticasone  (FLONASE ) 50 MCG/ACT nasal spray 1 spray, 1 spray, Each Nare, Daily PRN, Mansy, Jan A, MD   isosorbide  mononitrate (IMDUR ) 24 hr tablet 15 mg, 15 mg, Oral, Daily, Mansy, Jan A, MD, 15 mg at 08/30/24 9166   loratadine (CLARITIN) tablet 10 mg, 10  mg, Oral, Daily PRN, Mansy, Jan A, MD   magnesium hydroxide (MILK OF MAGNESIA) suspension 30 mL, 30 mL, Oral, Daily PRN, Mansy, Jan A,  MD   magnesium oxide (MAG-OX) tablet 400 mg, 400 mg, Oral, Daily, Mansy, Jan A, MD, 400 mg at 08/30/24 9166   multivitamin with minerals tablet 1 tablet, 1 tablet, Oral, Daily, Mansy, Jan A, MD, 1 tablet at 08/30/24 9166   nitroGLYCERIN  (NITROSTAT ) SL tablet 0.4 mg, 0.4 mg, Sublingual, Q5 min PRN, Mansy, Jan A, MD   omega-3 acid ethyl esters (LOVAZA ) capsule 1 g, 1 g, Oral, Daily, Mansy, Jan A, MD, 1 g at 08/30/24 9166   ondansetron  (ZOFRAN ) tablet 4 mg, 4 mg, Oral, Q6H PRN, 4 mg at 08/29/24 0105 **OR** ondansetron  (ZOFRAN ) injection 4 mg, 4 mg, Intravenous, Q6H PRN, Mansy, Jan A, MD   traZODone (DESYREL) tablet 25 mg, 25 mg, Oral, QHS PRN, Mansy, Jan A, MD, 25 mg at 08/29/24 0105  Vitals   Vitals:   08/30/24 0813 08/30/24 0819 08/30/24 0822 08/30/24 1222  BP: (!) 192/81 (!) 164/86 (!) 164/86 (!) 154/71  Pulse: 94 (!) 54 (!) 54 65  Resp: 16   17  Temp: 97.8 F (36.6 C)   98.4 F (36.9 C)  TempSrc:      SpO2: (!) 79%  99% 96%  Weight:      Height:        Body mass index is 18.64 kg/m.   Physical Exam   Gen: patient lying in bed, NAD CV: extremities appear well-perfused Resp: normal WOB  Neurologic exam MS: alert, oriented x4, follows commands Speech: no dysarthria, no aphasia CN: PERRL, VFF, EOMI, sensation intact, face symmetric, hearing intact to voice Motor: 5/5 strength throughout Sensory: Numbness on ulnar aspect of RUE forearm, otherwise intact Coordination: FNF intact bilat Gait: deferred   Labs/Imaging/Neurodiagnostic studies   CBC:  Recent Labs  Lab Sep 11, 2024 1036 08/29/24 0259  WBC 4.2 5.3  NEUTROABS 2.8  --   HGB 14.2 14.2  HCT 43.2 43.5  MCV 90.8 90.8  PLT 193 187   Basic Metabolic Panel:  Lab Results  Component Value Date   NA 138 08/30/2024   K 3.8 08/30/2024   CO2 26 08/30/2024   GLUCOSE 104 (H) 08/30/2024    BUN 13 08/30/2024   CREATININE 0.46 08/30/2024   CALCIUM  8.9 08/30/2024   GFRNONAA >60 08/30/2024   GFRAA >60 05/22/2020   Lipid Panel:  Lab Results  Component Value Date   LDLCALC 97 08/29/2024   HgbA1c:  Lab Results  Component Value Date   HGBA1C 6.3 (H) 09-11-24   Urine Drug Screen: No results found for: LABOPIA, COCAINSCRNUR, LABBENZ, AMPHETMU, THCU, LABBARB  Alcohol Level No results found for: ETH INR No results found for: INR APTT No results found for: APTT AED levels: No results found for: PHENYTOIN, ZONISAMIDE, LAMOTRIGINE, LEVETIRACETA  MRI brain wo 1. 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 2. Possible punctate subacute infarcts in the right internal capsule and right basal ganglia. 3. Moderate chronic small vessel ischemic disease.   CTA H&N 1. Severe stenosis versus short segment occlusion of the P2 segment of the right PCA extending over a length of 3mm. There is reconstitution of the posterior P2 segment. 2. Atherosclerosis involving the aortic arch, carotid arteries, and vertebral arteries as above. There is 50% stenosis at the origin of the left cervical ICA 3. Mild stenosis of the P1 segment of the right PCA. 4. Mild to moderate stenosis of a proximal M2 branch of the left MCA. 5. Mild stenosis of the proximal M1 segment of the right MCA. 6. Mild stenosis of the bilateral paraclinoid  internal carotid arteries. 7. Noncalcified atherosclerotic plaque along the left petrous internal carotid artery causing mild stenosis. 8. Right upper lobe 0.7 cm pulmonary nodule; recommend chest CT in 3 months to document resolution.  TTE 1. Left ventricular ejection fraction, by estimation, is 60 to 65% . The left ventricle has normal function. The left ventricle has no regional wall motion abnormalities. There is mild left ventricular hypertrophy. Left ventricular diastolic parameters are consistent with Grade I diastolic dysfunction (  impaired relaxation) . 2. Right ventricular systolic function is normal. The right ventricular size is normal. 3. The mitral valve is normal in structure. Mild mitral valve regurgitation. 4. The aortic valve is tricuspid. There is mild thickening of the aortic valve. Aortic valve regurgitation is trivial. 5. Agitated saline contrast bubble study was negative, with no evidence of any interatrial shunt.  BLE US  No e/o DVT  Stroke Labs     Component Value Date/Time   CHOL 182 08/29/2024 0259   CHOL 205 (H) 12/19/2015 1422   TRIG 58 08/29/2024 0259   HDL 73 08/29/2024 0259   HDL 84 12/19/2015 1422   CHOLHDL 2.5 08/29/2024 0259   VLDL 12 08/29/2024 0259   LDLCALC 97 08/29/2024 0259   LDLCALC 73 07/03/2024 1356   LDLDIRECT 96.0 03/10/2023 1214   LABVLDL 23 12/19/2015 1422    Lab Results  Component Value Date/Time   HGBA1C 6.3 (H) 08/28/2024 10:36 AM    ASSESSMENT   [name] is a 89 y.o. female with hx of stage II CKD, and as CLC, hypertension, hyperlipidemia, RA, spinal stenosis who presented to the ED with acute onset RUE numbness 2/2 punctate acute L thalamic infarct. MRI also notable for probable punctate subacute infarcts in R internal capsule and R basal ganglia. TTE showed no intracardiac clot. Given known history of malignancy and concern for embolic etiology given bilateral (though very small) acute/subacute infarcts, BLE US  was also performed but showed no e/o DVT. She was discharged with a Zio patch for further evaluation for occult a fib.  RECOMMENDATIONS   - ASA 81mg  daily + plavix  75mg  daily x90 days f/b aspirin  81mg  daily monotherapy after that (patient was taking aspirin  prior to admission but only 2x/weekly instead of daily so would not consider this an ASA failure. - OK to d/c from neuro standpoint - Zio to be placed prior to discharge - I will put in ambulatory referral to see Dr. Maree in  f/u ______________________________________________________________________    Signed, Elida CHRISTELLA Ross, MD [facility]

## 2024-08-30 NOTE — Discharge Summary (Signed)
 Physician Discharge Summary   Patient: [name] MRN: [id number] DOB: 1926-11-29  Admit date:     08/28/2024  Discharge date: 08/30/24  Discharge Physician: AIDA CHO   PCP: Marylynn Verneita CROME, MD   Recommendations at discharge:   Follow-up with PCP in 1 to 2 weeks Follow-up with Dr. Jannett Fairly, neurologist, within 1 month of discharge  Discharge Diagnoses: Principal Problem:   Acute ischemic VBA thalamic stroke, left [facility]) Active Problems:   Essential hypertension   Coronary artery disease  Resolved Problems:   * No resolved hospital problems. *  Hospital Course:  [name] is a 89 y.o. female with medical history significant for stage II chronic kidney disease, non-small cell lung cancer, diverticulosis, GERD, hypertension, dyslipidemia, rheumatoid arthritis, spinal stenosis, Raynaud's disease, who presented to the hospital with acute onset of slurred speech, stumbling around, right facial and arm numbness and pain in the right shoulder blade.  Reportedly, she went to bed around 9 PM the night before admission and woke up the following morning around 9 AM without symptoms.   She was found to have acute stroke.   MRI brain without contrast IMPRESSION: 1. 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 2. Possible punctate subacute infarcts in the right internal capsule and right basal ganglia. 3. Moderate chronic small vessel ischemic disease    Assessment and Plan:  Punctate acute stroke in the left thalamus, possible punctate subacute infarcts in the right internal capsule and right basal ganglia, intracranial stenosis: Case discussed with Dr. Aneita, neurologist.  She recommended dual antiplatelet therapy with low-dose aspirin  and Plavix  for 90 days followed by low-dose aspirin  monotherapy.   Risks and benefits of dual antiplatelet therapy discussed with patient and Devere, daughter, at the bedside.  They understand that patient is at increased risk  for bleeding (including but not limited to hematoma, intracranial bleeding, GI bleeding) because of advanced age. Cardiologist was consulted for Zio patch for ambulatory cardiac monitoring. PT and OT recommended home health therapy. Speech therapist recommended regular diet. 2D echo showed EF estimated at 60 to 65%, mild LVH, grade 1 diastolic dysfunction, mild MR, negative bubble study. Venous duplex of the lower extremities did not show any DVT.     CAD: Continue aspirin  and Imdur .  Patient was taking low-dose aspirin  twice a week according to her daughter. Recommended daily aspirin  rather than twice a week aspirin  for efficacy.   Hypertension: BP stable.     Hyperlipidemia: Continue Lipitor 40 mg daily.  Lipid panel showed total cholesterol 182, triglycerides 58, HDL 73, LDL 97.     Type II DM: Hemoglobin A1c 6.3.  Low-carb diet recommended.     Hypokalemia: Potassium 3.4.  Replete potassium and monitor levels.    Her condition has improved and she is deemed stable for discharge to home today.  Discharge plans discussed with Devere, daughter, at the bedside        Consultants: Neurologist Procedures performed: None Disposition: Home health Diet recommendation:  Discharge Diet Orders (From admission, onward)     Start     Ordered   08/30/24 0000  Diet - low sodium heart healthy        08/30/24 1500           Cardiac and Carb modified diet DISCHARGE MEDICATION: Allergies as of 08/30/2024       Reactions   Ibandronate Other (See Comments)   Achy all over. Flu like S/S   Other Other (See Comments)   Achy  all over. Flu like S/S Dysphagia   Prednisone Rash   Actonel [risedronate] Other (See Comments)   Dysphagia   Amoxicillin  Hives   Pantoprazole  Other (See Comments)   Little blistery bumps   Raloxifene Other (See Comments)   Mood swings   Versed  [midazolam ] Other (See Comments)   Difficult waking up and memory loss   Doxycycline Rash         Medication List     STOP taking these medications    Alfalfa 500 MG Tabs   clotrimazole -betamethasone  cream Commonly known as: LOTRISONE    diclofenac sodium 1 % Gel Commonly known as: VOLTAREN   Glucosamine Sulfate 500 MG Caps   GNP GINGKO BILOBA EXTRACT PO   nystatin  cream Commonly known as: MYCOSTATIN    Vitamin C CR 500 MG Cpcr       TAKE these medications    acetaminophen  500 MG tablet Commonly known as: TYLENOL  Take 500 mg by mouth every 6 (six) hours as needed for mild pain.   aspirin  EC 81 MG tablet Take 1 tablet (81 mg total) by mouth daily. Swallow whole.   atorvastatin  40 MG tablet Commonly known as: LIPITOR Take 1 tablet (40 mg total) by mouth daily. Start taking on: August 31, 2024   CALCIUM  600 PO Take by mouth.   clopidogrel  75 MG tablet Commonly known as: PLAVIX  Take 1 tablet (75 mg total) by mouth daily. Start taking on: August 31, 2024   fluticasone  50 MCG/ACT nasal spray Commonly known as: FLONASE  Place 1 spray into both nostrils daily as needed for allergies or rhinitis. What changed:  when to take this reasons to take this   Hydrocortisone Ace-Pramoxine 2.5-1 % Crea Apply topically as needed.   isosorbide  mononitrate 30 MG 24 hr tablet Commonly known as: IMDUR  Take 0.5 tablets (15 mg total) by mouth daily.   loratadine 10 MG tablet Commonly known as: CLARITIN Take 10 mg by mouth daily as needed for allergies.   Magnesium 400 MG Caps Take 400 mg by mouth daily.   multivitamin capsule Take 1 capsule by mouth daily.   nitroGLYCERIN  0.4 MG SL tablet Commonly known as: NITROSTAT  Place 1 tablet (0.4 mg total) under the tongue as needed for chest pain.   OMEGA 3 PO Take 520 mg by mouth daily.   PROBIOTIC ACIDOPHILUS PO Take 1 capsule by mouth daily.   triamcinolone  cream 0.1 % Commonly known as: KENALOG  Apply 1 Application topically 2 (two) times daily.   VITAMIN D3 PO Take 500 Units by mouth daily.         Follow-up Information     Juengel, Paul, MD. Schedule an appointment as soon as possible for a visit .   Specialty: Otolaryngology Contact information: [address] [place] [address] [phone]         [place], [facility] Follow up.   Specialty: Home Health Services Why: Agency will call to accept the referral. Contact information: [address] [place] [address] [phone]                Discharge Exam: [name]   08/28/24 1054  Weight: 43.3 kg   GEN: NAD SKIN: Warm and dry EYES: No pallor or icterus ENT: MMM CV: RRR PULM: CTA B ABD: soft, ND, NT, +BS CNS: AAO x 3, non focal EXT: No edema or tenderness   Condition at discharge: good  The results of significant diagnostics from this hospitalization (including imaging, microbiology,  ancillary and laboratory) are listed below for reference.   Imaging Studies: US  Venous Img Lower Bilateral (DVT) Result Date: 08/30/2024 CLINICAL DATA:  Embolic stroke EXAM: BILATERAL LOWER EXTREMITY VENOUS DOPPLER ULTRASOUND TECHNIQUE: Gray-scale sonography with graded compression, as well as color Doppler and duplex ultrasound were performed to evaluate the lower extremity deep venous systems from the level of the common femoral vein and including the common femoral, femoral, profunda femoral, popliteal and calf veins including the posterior tibial, peroneal and gastrocnemius veins when visible. The superficial great saphenous vein was also interrogated. Spectral Doppler was utilized to evaluate flow at rest and with distal augmentation maneuvers in the common femoral, femoral and popliteal veins. COMPARISON:  None Available. FINDINGS: RIGHT LOWER EXTREMITY Common Femoral Vein: No evidence of thrombus. Normal compressibility, respiratory phasicity and response to augmentation. Saphenofemoral Junction: No evidence of thrombus. Normal compressibility and flow on color Doppler  imaging. Profunda Femoral Vein: No evidence of thrombus. Normal compressibility and flow on color Doppler imaging. Femoral Vein: No evidence of thrombus. Normal compressibility, respiratory phasicity and response to augmentation. Popliteal Vein: No evidence of thrombus. Normal compressibility, respiratory phasicity and response to augmentation. Calf Veins: No evidence of thrombus. Normal compressibility and flow on color Doppler imaging. Superficial Great Saphenous Vein: No evidence of thrombus. Normal compressibility. Venous Reflux:  None. Other Findings:  None. LEFT LOWER EXTREMITY Common Femoral Vein: No evidence of thrombus. Normal compressibility, respiratory phasicity and response to augmentation. Saphenofemoral Junction: No evidence of thrombus. Normal compressibility and flow on color Doppler imaging. Profunda Femoral Vein: No evidence of thrombus. Normal compressibility and flow on color Doppler imaging. Femoral Vein: No evidence of thrombus. Normal compressibility, respiratory phasicity and response to augmentation. Popliteal Vein: No evidence of thrombus. Normal compressibility, respiratory phasicity and response to augmentation. Calf Veins: No evidence of thrombus. Normal compressibility and flow on color Doppler imaging. Superficial Great Saphenous Vein: No evidence of thrombus. Normal compressibility. Venous Reflux:  None. Other Findings:  None. IMPRESSION: No evidence of deep venous thrombosis in either lower extremity. Electronically Signed   By: Wilkie Lent M.D.   On: 08/30/2024 14:06   CT ANGIO HEAD NECK W WO CM Result Date: 08/29/2024 EXAM: CTA HEAD AND NECK WITH AND WITHOUT 08/29/2024 06:13:35 PM TECHNIQUE: CTA of the head and neck was performed with and without the administration of 75 mL of intravenous iohexol  (OMNIPAQUE ) 350 MG/ML injection. Multiplanar 2D and/or 3D reformatted images are provided for review. Automated exposure control, iterative reconstruction, and/or weight based  adjustment of the mA/kV was utilized to reduce the radiation dose to as low as reasonably achievable. Stenosis of the internal carotid arteries measured using NASCET criteria. COMPARISON: MRI head at 08/28/2024. CLINICAL HISTORY: Neuro deficit, acute, stroke suspected. FINDINGS: CTA NECK: AORTIC ARCH AND ARCH VESSELS: Atherosclerosis of the partially visualized aortic arch. Atherosclerosis involves the origin and proximal left subclavian artery without high grade stenosis. No dissection or arterial injury. No significant stenosis of the brachiocephalic artery. CERVICAL CAROTID ARTERIES: The right carotid artery is patent from the origin to the skull base. Calcified atherosclerosis at the right carotid bifurcation without hemodynamically significant stenosis. The left carotid artery is patent from the origin to the skull base. Bulky calcified atherosclerosis at the left carotid bifurcation. There is 50% stenosis at the origin of the left cervical ICA. No dissection or arterial injury. CERVICAL VERTEBRAL ARTERIES: The vertebral arteries are patent from the origins to the vertebrobasilar confluence. Atherosclerosis adjacent to the left vertebral artery origin without significant stenosis. There is  scattered atherosclerosis of the bilateral V2 segments. Additional atherosclerosis along the left V3 segment and bilateral V4 segments without high grade stenosis. No dissection, arterial injury, or significant stenosis. LUNGS AND MEDIASTINUM: Biapical pleuroparenchymal scarring. 0.7 cm nodule in the right upper lobe. Recommend CT chest in 3 months to document resolution. There are a few additional subtle nodules in the left upper lobe. SOFT TISSUES: No acute abnormality. BONES: Degenerative changes throughout the visualized spine. CTA HEAD: ANTERIOR CIRCULATION: Focal noncalcified atherosclerotic plaque along the left petrous ICA resulting in mild stenosis. The intracranial internal carotid arteries are patent bilaterally.  There is calcified atherosclerosis of the bilateral carotid siphons. There is mild stenosis of the bilateral paraclinoid ICAs. There is mild stenosis of the proximal M1 segment of the right MCA. There is mild to moderate stenosis of a proximal M2 segment of the left MCA. The middle cerebral arteries are patent bilaterally. The anterior cerebral arteries are patent bilaterally. No aneurysm. POSTERIOR CIRCULATION: There is mild stenosis of the P1 segment right PCA. There is focal severe stenosis versus short segment occlusion of the P2 segment of the right PCA extending over a length of approximately 3 mm. No significant stenosis of the basilar artery. No significant stenosis of the vertebral arteries. No aneurysm. OTHER: Moderate chronic microvascular ischemic changes. Remote lacunar infarct in the right basal ganglia. Possible areas of subacute infarct in the right basal ganglia are not well evaluated on the current study. Bilateral lens replacement. No dural venous sinus thrombosis on this non-dedicated study. IMPRESSION: 1. Severe stenosis versus short segment occlusion of the P2 segment of the right PCA extending over a length of 3mm. There is reconstitution of the posterior P2 segment. 2. Atherosclerosis involving the aortic arch, carotid arteries, and vertebral arteries as above. There is 50% stenosis at the origin of the left cervical ICA 3. Mild stenosis of the P1 segment of the right PCA. 4. Mild to moderate stenosis of a proximal M2 branch of the left MCA. 5. Mild stenosis of the proximal M1 segment of the right MCA. 6. Mild stenosis of the bilateral paraclinoid internal carotid arteries. 7. Noncalcified atherosclerotic plaque along the left petrous internal carotid artery causing mild stenosis. 8. Right upper lobe 0.7 cm pulmonary nodule; recommend chest CT in 3 months to document resolution. Electronically signed by: Donnice Mania MD 08/29/2024 07:51 PM EDT RP Workstation: [id number]   ECHOCARDIOGRAM  COMPLETE BUBBLE STUDY Result Date: 08/29/2024    ECHOCARDIOGRAM REPORT   Patient Name:   [name] Date of Exam: 08/29/2024 Medical Rec #:  [id number]           Height:       60.0 in Accession #:    [id number]          Weight:       95.5 lb Date of Birth:  [birth year]          BSA:          1.363 m Patient Age:    89 years            BP:           170/86 mmHg Patient Gender: F                   HR:           56 bpm. Exam Location:  [facility] Procedure: 2D Echo, Cardiac Doppler, Color Doppler and 3D Echo (Both Spectral  and Color Flow Doppler were utilized during procedure). Indications:     Stroke 434.91 / I63.9  History:         Patient has prior history of Echocardiogram examinations, most                  recent 07/09/2021. Signs/Symptoms:Murmur; Risk                  Factors:Hypertension.  Sonographer:     Christopher Furnace Referring Phys:  [id number] JAN A MANSY Diagnosing Phys: Keller Alluri IMPRESSIONS  1. Left ventricular ejection fraction, by estimation, is 60 to 65%. The left ventricle has normal function. The left ventricle has no regional wall motion abnormalities. There is mild left ventricular hypertrophy. Left ventricular diastolic parameters are consistent with Grade I diastolic dysfunction (impaired relaxation).  2. Right ventricular systolic function is normal. The right ventricular size is normal.  3. The mitral valve is normal in structure. Mild mitral valve regurgitation.  4. The aortic valve is tricuspid. There is mild thickening of the aortic valve. Aortic valve regurgitation is trivial.  5. Agitated saline contrast bubble study was negative, with no evidence of any interatrial shunt. FINDINGS  Left Ventricle: Left ventricular ejection fraction, by estimation, is 60 to 65%. The left ventricle has normal function. The left ventricle has no regional wall motion abnormalities. The left ventricular internal cavity size was normal in size. There is  mild left ventricular hypertrophy. Left  ventricular diastolic parameters are consistent with Grade I diastolic dysfunction (impaired relaxation). Right Ventricle: The right ventricular size is normal. No increase in right ventricular wall thickness. Right ventricular systolic function is normal. Left Atrium: Left atrial size was normal in size. Right Atrium: Right atrial size was normal in size. Pericardium: There is no evidence of pericardial effusion. Mitral Valve: The mitral valve is normal in structure. Mild mitral valve regurgitation. MV peak gradient, 5.5 mmHg. The mean mitral valve gradient is 2.0 mmHg. Tricuspid Valve: The tricuspid valve is normal in structure. Tricuspid valve regurgitation is trivial. Aortic Valve: The aortic valve is tricuspid. There is mild thickening of the aortic valve. Aortic valve regurgitation is trivial. Aortic valve mean gradient measures 3.5 mmHg. Aortic valve peak gradient measures 6.4 mmHg. Aortic valve area, by VTI measures 2.16 cm. Pulmonic Valve: The pulmonic valve was not well visualized. Pulmonic valve regurgitation is not visualized. Aorta: The aortic root is normal in size and structure. IAS/Shunts: The atrial septum is grossly normal. Agitated saline contrast bubble study was negative, with no evidence of any interatrial shunt.  LEFT VENTRICLE PLAX 2D LVIDd:         4.30 cm   Diastology LVIDs:         2.50 cm   LV e' medial:    5.77 cm/s LV PW:         1.10 cm   LV E/e' medial:  13.9 LV IVS:        1.00 cm   LV e' lateral:   8.81 cm/s LVOT diam:     2.00 cm   LV E/e' lateral: 9.1 LV SV:         69 LV SV Index:   51 LVOT Area:     3.14 cm  RIGHT VENTRICLE RV Basal diam:  3.40 cm RV Mid diam:    2.60 cm RV S prime:     12.70 cm/s TAPSE (M-mode): 2.0 cm LEFT ATRIUM  Index        RIGHT ATRIUM           Index LA diam:        3.20 cm 2.35 cm/m   RA Area:     11.80 cm LA Vol (A2C):   28.4 ml 20.83 ml/m  RA Volume:   26.30 ml  19.29 ml/m LA Vol (A4C):   21.3 ml 15.63 ml/m LA Biplane Vol: 25.8 ml  18.93 ml/m  AORTIC VALVE AV Area (Vmax):    2.29 cm AV Area (Vmean):   2.17 cm AV Area (VTI):     2.16 cm AV Vmax:           126.00 cm/s AV Vmean:          85.000 cm/s AV VTI:            0.322 m AV Peak Grad:      6.4 mmHg AV Mean Grad:      3.5 mmHg LVOT Vmax:         91.70 cm/s LVOT Vmean:        58.700 cm/s LVOT VTI:          0.221 m LVOT/AV VTI ratio: 0.69  AORTA Ao Root diam: 2.80 cm MITRAL VALVE               TRICUSPID VALVE MV Area (PHT): 2.52 cm    TR Peak grad:   25.6 mmHg MV Area VTI:   2.08 cm    TR Vmax:        253.00 cm/s MV Peak grad:  5.5 mmHg MV Mean grad:  2.0 mmHg    SHUNTS MV Vmax:       1.17 m/s    Systemic VTI:  0.22 m MV Vmean:      65.5 cm/s   Systemic Diam: 2.00 cm MV Decel Time: 301 msec MV E velocity: 80.20 cm/s MV A velocity: 95.90 cm/s MV E/A ratio:  0.84 Keller Paterson Electronically signed by Keller Paterson Signature Date/Time: 08/29/2024/11:48:45 AM    Final    MR BRAIN WO CONTRAST Result Date: 08/28/2024 EXAM: MRI BRAIN WITHOUT CONTRAST 08/28/2024 06:24:36 PM TECHNIQUE: Multiplanar multisequence MRI of the head/brain was performed without the administration of intravenous contrast. COMPARISON: Head CT 08/28/2024 and MRI 09/26/2023. CLINICAL HISTORY: Headache, neuro deficit, right arm numbness. FINDINGS: BRAIN AND VENTRICLES: There is a punctate acute infarct in the left thalamus. Punctate foci of milder diffusion weighted signal abnormality are present in the right internal capsule and inferior right basal ganglia and are indeterminate but could reflect subacute infarcts. No intracranial hemorrhage. No mass. No midline shift. No hydrocephalus. No extra axial fluid collection. Patchy to confluent T2 hyperintensities in the cerebral white matter and pons are similar to the prior MRI and are nonspecific but compatible with moderate chronic small vessel ischemic disease. Chronic lacunar infarcts are noted in the thalami and basal ganglia regions bilaterally. There is mild  cerebral atrophy. Major intracranial arterial flow voids are preserved. ORBITS: Bilateral cataract extraction. SINUSES AND MASTOIDS: No acute abnormality. BONES AND SOFT TISSUES: Normal marrow signal. No acute soft tissue abnormality. IMPRESSION: 1. 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 2. Possible punctate subacute infarcts in the right internal capsule and right basal ganglia. 3. Moderate chronic small vessel ischemic disease. Electronically signed by: Dasie Hamburg MD 08/28/2024 07:28 PM EDT RP Workstation: [id number]   CT Angio Chest/Abd/Pel for Dissection W and/or Wo Contrast Result Date: 08/28/2024 CLINICAL DATA:  Vertigo, dizziness, acute  aortic syndrome suspected, history of non-small-cell lung cancer * Tracking Code: BO * EXAM: CT ANGIOGRAPHY CHEST, ABDOMEN AND PELVIS TECHNIQUE: Non-contrast CT of the chest was initially obtained. Multidetector CT imaging through the chest, abdomen and pelvis was performed using the standard protocol during bolus administration of intravenous contrast. Multiplanar reconstructed images and MIPs were obtained and reviewed to evaluate the vascular anatomy. RADIATION DOSE REDUCTION: This exam was performed according to the departmental dose-optimization program which includes automated exposure control, adjustment of the mA and/or kV according to patient size and/or use of iterative reconstruction technique. CONTRAST:  75mL OMNIPAQUE  IOHEXOL  350 MG/ML SOLN COMPARISON:  CT chest, 09/30/2023 FINDINGS: CTA CHEST FINDINGS VASCULAR Aorta: Satisfactory opacification of the aorta. Normal contour and caliber of the thoracic aorta. No evidence of aneurysm, dissection, or other acute aortic pathology. Moderate aortic atherosclerosis. Cardiovascular: No evidence of pulmonary embolism on limited non-tailored examination. Left and right coronary artery calcifications. No pericardial effusion. Review of the MIP images confirms the above findings. NON VASCULAR Mediastinum/Nodes: No  enlarged mediastinal, hilar, or axillary lymph nodes. Thyroid  gland, trachea, and esophagus demonstrate no significant findings. Lungs/Pleura: Unchanged irregular nodularity in the peripheral right apex (series 7, image 30, 38). No pleural effusion or pneumothorax. Musculoskeletal: No chest wall abnormality. No acute osseous findings. Review of the MIP images confirms the above findings. CTA ABDOMEN AND PELVIS FINDINGS VASCULAR Normal contour and caliber of the abdominal aorta. No evidence of aneurysm, dissection, or other acute aortic pathology. Standard branching pattern of the abdominal aorta with solitary bilateral renal arteries. Celiac axis origin stent which appears patent (series 5, image 144). Moderate abdominal aortic atherosclerosis. Review of the MIP images confirms the above findings. NON-VASCULAR Hepatobiliary: No focal liver abnormality is seen. Status post cholecystectomy. No biliary dilatation. Pancreas: Unremarkable. No pancreatic ductal dilatation or surrounding inflammatory changes. Spleen: Normal in size without significant abnormality. Adrenals/Urinary Tract: Adrenal glands are unremarkable. Kidneys are normal, without renal calculi, solid lesion, or hydronephrosis. Bladder is unremarkable. Stomach/Bowel: Stomach is within normal limits. Appendix appears normal. No evidence of bowel wall thickening, distention, or inflammatory changes. Sigmoid diverticulosis. Lymphatic: No enlarged abdominal or pelvic lymph nodes. Reproductive: Calcified uterine fibroid. Other: No abdominal wall hernia or abnormality. No ascites. Musculoskeletal: No acute osseous findings. Osteopenia. Unchanged superior endplate deformities of T11 and L2. Vertebral cement augmentation of L4. IMPRESSION: 1. Normal contour and caliber of the thoracic and abdominal aorta. No evidence of aneurysm, dissection, or other acute aortic pathology. Moderate aortic atherosclerosis. 2. Patent appearing stent of the celiac axis origin. 3.  Unchanged irregular nodularity in the peripheral right apex, consistent with treated lung nodule. 4. Coronary artery disease. 5. Sigmoid diverticulosis without evidence of acute diverticulitis. Aortic Atherosclerosis (ICD10-I70.0). Electronically Signed   By: Marolyn JONETTA Jaksch M.D.   On: 08/28/2024 14:46   DG Chest Portable 1 View Result Date: 08/28/2024 EXAM: 1 VIEW(S) XRAY OF THE CHEST 08/28/2024 12:05:00 PM COMPARISON: 07/03/2024 CLINICAL HISTORY: cp. Patient presents with chest pain and vertigo x 2-3 days FINDINGS: LUNGS AND PLEURA: Stable calcified right apical granuloma. No pulmonary edema. No pleural effusion. No pneumothorax. HEART AND MEDIASTINUM: Aortic atherosclerosis. No acute abnormality of the cardiac and mediastinal silhouettes. BONES AND SOFT TISSUES: Multiple left shoulder calcified bodies. Old right rib fractures. IMPRESSION: 1. No acute cardiopulmonary findings. 2. Stable calcified right apical granuloma. Electronically signed by: Franky Stanford MD 08/28/2024 01:56 PM EDT RP Workstation: [id number]   CT HEAD WO CONTRAST ( ) Result Date: 08/28/2024 EXAM: CT HEAD WITHOUT CONTRAST 08/28/2024 01:23:58 PM  TECHNIQUE: CT of the head was performed without the administration of intravenous contrast. Automated exposure control, iterative reconstruction, and/or weight based adjustment of the mA/kV was utilized to reduce the radiation dose to as low as reasonably achievable. COMPARISON: 09/26/2023 CLINICAL HISTORY: Head trauma, intracranial arterial injury suspected. Pt states she has been experiencing vertigo for the past couple of days with dizziness with position changes. No hx of the same. Pt states she went to bed feeling normal and this morning woke up around 9 am with right sided numbness to her face and arm. Pt alert with clear speech. FINDINGS: BRAIN AND VENTRICLES: No acute hemorrhage. No evidence of acute infarct. Mild cerebral volume loss with associated ex vacuo dilatation.  Periventricular white matter hypoattenuation, likely chronic small vessel ischemic disease. No extra-axial collection. No mass effect or midline shift. ORBITS: No acute abnormality. SINUSES: No acute abnormality. SOFT TISSUES AND SKULL: No acute soft tissue abnormality. No skull fracture. Vascular calcifications in the intracranial ICAs. IMPRESSION: 1. No acute intracranial abnormality. 2. Mild cerebral volume loss with associated ex vacuo dilatation. 3. Periventricular white matter hypoattenuation, likely chronic small vessel ischemic disease. Electronically signed by: Franky Stanford MD 08/28/2024 01:36 PM EDT RP Workstation: [id number]    Microbiology: Results for orders placed or performed during the hospital encounter of 09/26/23  Resp panel by RT-PCR (RSV, Flu A&B, Covid) Anterior Nasal Swab     Status: None   Collection Time: 09/26/23 10:27 AM   Specimen: Anterior Nasal Swab  Result Value Ref Range Status   SARS Coronavirus 2 by RT PCR NEGATIVE NEGATIVE Final    Comment: (NOTE) SARS-CoV-2 target nucleic acids are NOT DETECTED.  The SARS-CoV-2 RNA is generally detectable in upper respiratory specimens during the acute phase of infection. The lowest concentration of SARS-CoV-2 viral copies this assay can detect is 138 copies/mL. A negative result does not preclude SARS-Cov-2 infection and should not be used as the sole basis for treatment or other patient management decisions. A negative result may occur with  improper specimen collection/handling, submission of specimen other than nasopharyngeal swab, presence of viral mutation(s) within the areas targeted by this assay, and inadequate number of viral copies(<138 copies/mL). A negative result must be combined with clinical observations, patient history, and epidemiological information. The expected result is Negative.  Fact Sheet for Patients:  BloggerCourse.com  Fact Sheet for Healthcare Providers:   SeriousBroker.it  This test is no t yet approved or cleared by the [place]  FDA and  has been authorized for detection and/or diagnosis of SARS-CoV-2 by FDA under an Emergency Use Authorization (EUA). This EUA will remain  in effect (meaning this test can be used) for the duration of the COVID-19 declaration under Section 564(b)(1) of the Act, 21 U.S.C.section 360bbb-3(b)(1), unless the authorization is terminated  or revoked sooner.       Influenza A by PCR NEGATIVE NEGATIVE Final   Influenza B by PCR NEGATIVE NEGATIVE Final    Comment: (NOTE) The Xpert Xpress SARS-CoV-2/FLU/RSV plus assay is intended as an aid in the diagnosis of influenza from Nasopharyngeal swab specimens and should not be used as a sole basis for treatment. Nasal washings and aspirates are unacceptable for Xpert Xpress SARS-CoV-2/FLU/RSV testing.  Fact Sheet for Patients: BloggerCourse.com  Fact Sheet for Healthcare Providers: SeriousBroker.it  This test is not yet approved or cleared by the [place]  FDA and has been authorized for detection and/or diagnosis of SARS-CoV-2 by FDA under an Emergency Use Authorization (EUA). This EUA will remain in  effect (meaning this test can be used) for the duration of the COVID-19 declaration under Section 564(b)(1) of the Act, 21 U.S.C. section 360bbb-3(b)(1), unless the authorization is terminated or revoked.     Resp Syncytial Virus by PCR NEGATIVE NEGATIVE Final    Comment: (NOTE) Fact Sheet for Patients: BloggerCourse.com  Fact Sheet for Healthcare Providers: SeriousBroker.it  This test is not yet approved or cleared by the [place]  FDA and has been authorized for detection and/or diagnosis of SARS-CoV-2 by FDA under an Emergency Use Authorization (EUA). This EUA will remain in effect (meaning this test can be used) for  the duration of the COVID-19 declaration under Section 564(b)(1) of the Act, 21 U.S.C. section 360bbb-3(b)(1), unless the authorization is terminated or revoked.  Performed at [facility], [address] [place], [place] [address]     Labs: CBC: Recent Labs  Lab 08/28/24 1036 08/29/24 0259  WBC 4.2 5.3  NEUTROABS 2.8  --   HGB 14.2 14.2  HCT 43.2 43.5  MCV 90.8 90.8  PLT 193 187   Basic Metabolic Panel: Recent Labs  Lab 08/28/24 1036 08/29/24 0259 08/29/24 0345 08/30/24 0552  NA 138 138  --  138  K 4.0 3.4*  --  3.8  CL 101 104  --  102  CO2 26 26  --  26  GLUCOSE 139* 86  --  104*  BUN 16 12  --  13  CREATININE 0.60 0.48  --  0.46  CALCIUM  8.9 8.5*  --  8.9  MG  --   --  2.3  --    Liver Function Tests: Recent Labs  Lab 08/28/24 1036  AST 37  ALT 18  ALKPHOS 47  BILITOT 0.6  PROT 7.1  ALBUMIN 3.7   CBG: Recent Labs  Lab 08/28/24 1048  GLUCAP 138*    Discharge time spent: greater than 30 minutes.  Signed: AIDA CHO, MD [facility] 08/30/2024

## 2024-08-30 NOTE — Progress Notes (Signed)
 Physical Therapy Treatment Patient Details Name: [name] MRN: [id number] DOB: Feb 24, 1927 Today's Date: 08/30/2024   History of Present Illness Pt is a 89 y.o. female presented [facility] ED with transient BUE numbness and questionable slurred speech. MRI showed punctate acute infarct in L thalamus and possible subacute infarcts in R internal capsule / basal ganglia.   PMH significant for HTN, dyslipidemia, RA, spinal stenosis, Raynaud's disease, State II CKD, non-small cell lung cancer, diverticulosis, kyphoplasty.    PT Comments  Pt alert, pleasant and motivated to participate in PT tx. Pt was received in bed, able to perform bed mobility with minA trunk/BLE assist this date. Pt performed STS from EOB and toilet with CGA and VC for hand placement on RW/rail in bathroom. Pt able to perform pericare in sitting with supervision and stand for lower body dressing/hand hygiene with fair balance. Pt noted to demonstrate L sided inattention at times, missing hand soap dispenser with L hand in bathroom and variable L hand placement on RW. Pt amb ~158ft with RW and CGA, no LOB, tendency to veer to R especially with dynamic gait tasks. Max multimodal cues throughout ambulation to attend to L side. DGI assessed during session, score of 10 indicates pt is at increased risk of falls. Pt navigated set of 4 stairs, ascended with bilateral hand rail and alternating pattern and descended with unilateral hand rail with step-to pattern to mimic home navigation, no LOB throughout. Pt was left semi-supine in bed at end of session, all needs in reach, supportive daughter at bedside.     If plan is discharge home, recommend the following: A little help with walking and/or transfers;A little help with bathing/dressing/bathroom;Assist for transportation;Help with stairs or ramp for entrance   Can travel by private vehicle        Equipment Recommendations  None recommended by PT    Recommendations for Other  Services       Precautions / Restrictions Precautions Precautions: Fall Recall of Precautions/Restrictions: Intact Restrictions Weight Bearing Restrictions Per Provider Order: No     Mobility  Bed Mobility Overal bed mobility: Needs Assistance Bed Mobility: Supine to Sit, Sit to Supine     Supine to sit: Min assist, HOB elevated Sit to supine: Min assist   General bed mobility comments: minA trunk assist to exit bed, minA BLE assist back into bed    Transfers Overall transfer level: Needs assistance Equipment used: Rolling walker (2 wheels) Transfers: Sit to/from Stand Sit to Stand: Contact guard assist           General transfer comment: STS from EOB and toilet with CGA and VC for hand placement on RW    Ambulation/Gait Ambulation/Gait assistance: Contact guard assist Gait Distance (Feet): 150 Feet Assistive device: Rolling walker (2 wheels) Gait Pattern/deviations: Step-through pattern, Trunk flexed, Narrow base of support, Drifts right/left Gait velocity: decreased     General Gait Details: No LOB, mod VC for RW positioning to maintain BOS within RW frame- pt stated she felt more steady amb with RW. Cues throughout for horizontal head movement to attend to L side, tendency to drift to R   Stairs Stairs: Yes Stairs assistance: Contact guard assist Stair Management: Two rails, Alternating pattern, Forwards, Sideways Number of Stairs: 4 General stair comments: Pt able to navigate set of 4 stairs with CGA: ascended with bilateral hand rails and alternating step pattern, descended with R hand rail BUE support and at an angle with step-to pattern. No LOB throughout  Wheelchair Mobility     Tilt Bed    Modified Rankin (Stroke Patients Only)       Balance Overall balance assessment: Needs assistance Sitting-balance support: Feet supported Sitting balance-Leahy Scale: Good Sitting balance - Comments: steady static and dynamic sitting   Standing  balance support: Bilateral upper extremity supported, No upper extremity supported Standing balance-Leahy Scale: Fair Standing balance comment: able to stand for lower body dressing and wash hands at sink with CGA, mild increased postural sway with no UE support                     Dynamic Gait Index Level Surface: Mild Impairment Change in Gait Speed: Moderate Impairment Gait with Horizontal Head Turns: Severe Impairment Gait with Vertical Head Turns: Mild Impairment Gait and Pivot Turn: Moderate Impairment Step Over Obstacle: Moderate Impairment Step Around Obstacles: Mild Impairment Steps: Moderate Impairment Total Score: 10      Communication Communication Communication: Impaired Factors Affecting Communication: Hearing impaired  Cognition Arousal: Alert Behavior During Therapy: WFL for tasks assessed/performed   PT - Cognitive impairments: No apparent impairments                       PT - Cognition Comments: A&Ox4, pleasant and cooperative throughout Following commands: Intact      Cueing    Exercises Other Exercises Other Exercises: HR in low 70s after completing ambulation, attempted to get pulse oximetry reading but unable to    General Comments        Pertinent Vitals/Pain Pain Assessment Pain Assessment: No/denies pain    Home Living                          Prior Function            PT Goals (current goals can now be found in the care plan section) Progress towards PT goals: Progressing toward goals    Frequency    Min 2X/week      PT Plan      Co-evaluation              AM-PAC PT 6 Clicks Mobility   Outcome Measure  Help needed turning from your back to your side while in a flat bed without using bedrails?: A Little Help needed moving from lying on your back to sitting on the side of a flat bed without using bedrails?: A Little Help needed moving to and from a bed to a chair (including a  wheelchair)?: A Little Help needed standing up from a chair using your arms (e.g., wheelchair or bedside chair)?: A Little Help needed to walk in hospital room?: A Little Help needed climbing 3-5 steps with a railing? : A Little 6 Click Score: 18    End of Session Equipment Utilized During Treatment: Gait belt Activity Tolerance: Patient tolerated treatment well Patient left: in bed;with call bell/phone within reach;with bed alarm set;with family/visitor present Nurse Communication: Mobility status PT Visit Diagnosis: Muscle weakness (generalized) (M62.81);Difficulty in walking, not elsewhere classified (R26.2);Unsteadiness on feet (R26.81)     Time: 8554-8491 PT Time Calculation (min) (ACUTE ONLY): 23 min  Charges:    $Gait Training: 8-22 mins $Neuromuscular Re-education: 8-22 mins PT General Charges $$ ACUTE PT VISIT: 1 Visit                     Janell Axe, SPT

## 2024-08-31 DIAGNOSIS — I639 Cerebral infarction, unspecified: Secondary | ICD-10-CM | POA: Diagnosis not present

## 2024-09-05 ENCOUNTER — Telehealth: Payer: Self-pay | Admitting: *Deleted

## 2024-09-05 NOTE — Telephone Encounter (Unsigned)
 Copied from CRM [id number]. Topic: Clinical - Request for Lab/Test Order >> Sep 05, 2024 11:03 AM Mia F wrote: Reason for CRM: Nidia from [employer] receievd an order for pt to recieved OT & PT by hospital. Want to make sure that Dr Marylynn will be the physicial to oversee care. Please advise   [phone]

## 2024-09-06 DIAGNOSIS — Z8616 Personal history of COVID-19: Secondary | ICD-10-CM | POA: Diagnosis not present

## 2024-09-06 DIAGNOSIS — N182 Chronic kidney disease, stage 2 (mild): Secondary | ICD-10-CM | POA: Diagnosis not present

## 2024-09-06 DIAGNOSIS — K5790 Diverticulosis of intestine, part unspecified, without perforation or abscess without bleeding: Secondary | ICD-10-CM | POA: Diagnosis not present

## 2024-09-06 DIAGNOSIS — I129 Hypertensive chronic kidney disease with stage 1 through stage 4 chronic kidney disease, or unspecified chronic kidney disease: Secondary | ICD-10-CM | POA: Diagnosis not present

## 2024-09-06 DIAGNOSIS — Z7982 Long term (current) use of aspirin: Secondary | ICD-10-CM | POA: Diagnosis not present

## 2024-09-06 DIAGNOSIS — Z85118 Personal history of other malignant neoplasm of bronchus and lung: Secondary | ICD-10-CM | POA: Diagnosis not present

## 2024-09-06 DIAGNOSIS — Z7902 Long term (current) use of antithrombotics/antiplatelets: Secondary | ICD-10-CM | POA: Diagnosis not present

## 2024-09-06 DIAGNOSIS — K219 Gastro-esophageal reflux disease without esophagitis: Secondary | ICD-10-CM | POA: Diagnosis not present

## 2024-09-06 DIAGNOSIS — E876 Hypokalemia: Secondary | ICD-10-CM | POA: Diagnosis not present

## 2024-09-06 DIAGNOSIS — R42 Dizziness and giddiness: Secondary | ICD-10-CM | POA: Diagnosis not present

## 2024-09-06 DIAGNOSIS — I7300 Raynaud's syndrome without gangrene: Secondary | ICD-10-CM | POA: Diagnosis not present

## 2024-09-06 DIAGNOSIS — M48061 Spinal stenosis, lumbar region without neurogenic claudication: Secondary | ICD-10-CM | POA: Diagnosis not present

## 2024-09-06 DIAGNOSIS — I700 Atherosclerosis of aorta: Secondary | ICD-10-CM | POA: Diagnosis not present

## 2024-09-06 DIAGNOSIS — M1990 Unspecified osteoarthritis, unspecified site: Secondary | ICD-10-CM | POA: Diagnosis not present

## 2024-09-06 DIAGNOSIS — E1122 Type 2 diabetes mellitus with diabetic chronic kidney disease: Secondary | ICD-10-CM | POA: Diagnosis not present

## 2024-09-06 DIAGNOSIS — R200 Anesthesia of skin: Secondary | ICD-10-CM | POA: Diagnosis not present

## 2024-09-06 DIAGNOSIS — M810 Age-related osteoporosis without current pathological fracture: Secondary | ICD-10-CM | POA: Diagnosis not present

## 2024-09-06 DIAGNOSIS — I69398 Other sequelae of cerebral infarction: Secondary | ICD-10-CM | POA: Diagnosis not present

## 2024-09-06 DIAGNOSIS — M069 Rheumatoid arthritis, unspecified: Secondary | ICD-10-CM | POA: Diagnosis not present

## 2024-09-06 DIAGNOSIS — Z9049 Acquired absence of other specified parts of digestive tract: Secondary | ICD-10-CM | POA: Diagnosis not present

## 2024-09-06 DIAGNOSIS — E7800 Pure hypercholesterolemia, unspecified: Secondary | ICD-10-CM | POA: Diagnosis not present

## 2024-09-06 DIAGNOSIS — I2510 Atherosclerotic heart disease of native coronary artery without angina pectoris: Secondary | ICD-10-CM | POA: Diagnosis not present

## 2024-09-06 NOTE — Telephone Encounter (Signed)
[facility] is aware.

## 2024-09-19 NOTE — Addendum Note (Signed)
 Encounter addended by: Wilfred Dillard BROCKS, CMA on: 09/19/2024 1:23 PM  Actions taken: Imaging Exam ended

## 2024-09-20 ENCOUNTER — Telehealth: Payer: Self-pay | Admitting: Cardiology

## 2024-09-20 ENCOUNTER — Ambulatory Visit: Payer: Self-pay | Admitting: Nurse Practitioner

## 2024-09-20 DIAGNOSIS — I639 Cerebral infarction, unspecified: Secondary | ICD-10-CM | POA: Diagnosis not present

## 2024-09-20 NOTE — Telephone Encounter (Signed)
 Pt was returning nurse call regarding results and is requesting a callback. Please advise

## 2024-09-21 ENCOUNTER — Telehealth: Payer: Self-pay | Admitting: Emergency Medicine

## 2024-09-21 NOTE — Telephone Encounter (Signed)
 Called and spoke with the patient and her daughter, per patient request, to relay the results of the heart monitor study as interpreted by Medford Meager, NP. Also relayed the recommendation to have an earlier office visit with Suzann Riddle, NP to discuss the heart monitor results as well as the recommendation to alter treatment plan of stopping aspirin  and plavix  and starting eliquis. Patient and daughter agreeable to this plan with all questions and concerns addressed at this time.

## 2024-09-24 NOTE — Progress Notes (Unsigned)
 Electrophysiology Clinic Note    Date:  09/25/2024  Patient ID:  [name], [name] 10-06-1927, MRN [id number] PCP:  Marylynn Verneita CROME, MD  Cardiologist:  Redell Cave, MD  Electrophysiology APP:  Laakea Pereira, NP     Discussed the use of AI scribe software for clinical note transcription with the patient, who gave verbal consent to proceed.   Patient Profile    Chief Complaint: CVA, aflutter  History of Present Illness: [name] is a 89 y.o. female with PMH notable for HTN, PVCs, CKD, celiac artery stenosis s/p stenting, RA; seen today for EP evaluation after cryptogenic stroke.   She was hospitalized 08/2024 with acute onset RUE numbness. She was discharged with zio monitor which noted brief episodes of atrial flutter and she was started on 2.5mg  eliquis.   On follow-up today, she is joined by 2 of her daughters. She is having ongoing right-sided weakness in her legs and numbness in her fingers.  Her daughters also note that she has some short-term memory difficulties often forgetting small details of things that she is told.  She has not followed up with neurology since discharge.  She is working with physical therapy and Occupational Therapy. She is very motivated to return to her gardening.  She continues to take Plavix  and 81 mg aspirin  daily without significant bleeding concerns.  She does note small amounts of dried blood in nasal mucus that occurs very infrequently.  She does not endorse palpitations, but did have an episode about a month ago where she recalls waking up in the middle of the night to a pounding heartbeat.  Generally she is unaware of her heart rate and rhythm.      Arrhythmia/Device History No specialty comments available.    ROS:  Please see the history of present illness. All other systems are reviewed and otherwise negative.    Physical Exam    VS:  BP (!) 150/80 (BP Location: Left Arm, Patient Position: Sitting, Cuff  Size: Normal)   Pulse 62   Ht 5' (1.524 m)   Wt 96 lb 4 oz (43.7 kg)   SpO2 97%   BMI 18.80 kg/m  BMI: Body mass index is 18.8 kg/m.           Wt Readings from Last 3 Encounters:  09/25/24 96 lb 4 oz (43.7 kg)  08/28/24 95 lb 7.4 oz (43.3 kg)  07/30/24 96 lb (43.5 kg)     GEN- The patient is well appearing, alert and oriented x 3 today.   Lungs- Clear to ausculation bilaterally, normal work of breathing.  Heart- Regular rate and rhythm, no murmurs, rubs or gallops Extremities- No peripheral edema, warm, dry   Studies Reviewed   Previous EP, cardiology notes.    EKG is ordered. Personal review of EKG from today shows:    EKG Interpretation Date/Time:  Tuesday September 25 2024 09:46:26 EST Ventricular Rate:  62 PR Interval:  154 QRS Duration:  80 QT Interval:  404 QTC Calculation: 410 R Axis:   -10  Text Interpretation: Normal sinus rhythm Normal ECG Confirmed by Mirinda Monte [phone]) on 09/25/2024 10:02:45 AM    Long term monitor, 09/19/2024 Average heart rate 66, range 44-119. Brief episode of atrial flutter noted. Some episodes of SVT with actually atrial flutter. Occasional PVCs, 2.1% burden. With recent history of stroke, recommend starting Eliquis 2.5 mg twice daily (stop aspirin , Plavix  with starting Eliquis.)  Bubble TTE, 08/29/2024 1. Left ventricular ejection fraction,  by estimation, is 60 to 65%. The left ventricle has normal function. The left ventricle has no regional wall motion abnormalities. There is mild left ventricular hypertrophy. Left ventricular diastolic parameters are consistent with Grade I diastolic dysfunction (impaired relaxation).   2. Right ventricular systolic function is normal. The right ventricular size is normal.   3. The mitral valve is normal in structure. Mild mitral valve regurgitation.   4. The aortic valve is tricuspid. There is mild thickening of the aortic valve. Aortic valve regurgitation is trivial.   5. Agitated saline  contrast bubble study was negative, with no evidence of any interatrial shunt.    Assessment and Plan     #) aflutter Very brief aflutter episodes noted on recent cardiac monitor With patient's recent CVA diagnosis and elevated chadsvasc score, will stop Plavix  and aspirin  and start 2.5 mg eliquis BID   #) Hypercoag d/t afib CHA2DS2-VASc Score = at least 7 [CHF History: 0, HTN History: 1, Diabetes History: 0, Stroke History: 2, Vascular Disease History: 1, Age Score: 2, Gender Score: 1].  Therefore, the patient's annual risk of stroke is 11.2 %.    Stroke ppx -stop aspirin  and start Eliquis as above Advised her to monitor for any bleeding concerns through GI, GU tracts, or increased bloody nasal mucus.   #) CVA Continue physical therapy, Occupational Therapy. Consider speech therapy for ongoing memory difficulties Will refer to neurology for ongoing evaluation and management.       Current medicines are reviewed at length with the patient today.   The patient does not have concerns regarding her medicines.  The following changes were made today:   STOP plavix  and aspirin  START 2.5mg  eliquis BID  Labs/ tests ordered today include:  Orders Placed This Encounter  Procedures   Ambulatory referral to Neurology   EKG 12-Lead     Disposition: Follow up with EP APP or EP APP in 6 weeks    Signed, Rett Stehlik, NP  09/25/24  12:30 PM  Electrophysiology [facility]

## 2024-09-25 ENCOUNTER — Telehealth (HOSPITAL_COMMUNITY): Payer: Self-pay

## 2024-09-25 ENCOUNTER — Ambulatory Visit: Attending: Cardiology | Admitting: Cardiology

## 2024-09-25 ENCOUNTER — Encounter: Payer: Self-pay | Admitting: Cardiology

## 2024-09-25 ENCOUNTER — Other Ambulatory Visit (HOSPITAL_COMMUNITY): Payer: Self-pay

## 2024-09-25 VITALS — BP 150/80 | HR 62 | Ht 60.0 in | Wt 96.2 lb

## 2024-09-25 DIAGNOSIS — I639 Cerebral infarction, unspecified: Secondary | ICD-10-CM

## 2024-09-25 DIAGNOSIS — I4892 Unspecified atrial flutter: Secondary | ICD-10-CM

## 2024-09-25 DIAGNOSIS — D6869 Other thrombophilia: Secondary | ICD-10-CM

## 2024-09-25 MED ORDER — APIXABAN 2.5 MG PO TABS: 2.5000 mg | ORAL_TABLET | Freq: Two times a day (BID) | ORAL | 3 refills | Status: AC

## 2024-09-25 NOTE — Telephone Encounter (Signed)
 Pharmacy Patient Advocate Encounter  Insurance verification completed.    The patient is insured through [employer]/ [employer]. Patient has Medicare and is not eligible for a copay card, but may be able to apply for patient assistance or Medicare RX Payment Plan (Patient Must reach out to their plan, if eligible for payment plan), if available.    Ran test claim for Eliquis 2.5mg  and the current 30 day co-pay is $47.   This test claim was processed through [facility]- copay amounts may vary at other pharmacies due to [employer], or as the patient moves through the different stages of their insurance plan.

## 2024-09-25 NOTE — Patient Instructions (Signed)
 Referral to Neurology  Medication Instructions:  Your physician recommends the following medication changes.  START TAKING: Eliquis 2.5 mg twice daily  *If you need a refill on your cardiac medications before your next appointment, please call your pharmacy*  Lab Work: No labs ordered today  If you have labs (blood work) drawn today and your tests are completely normal, you will receive your results only by: MyChart Message (if you have MyChart) OR A paper copy in the mail If you have any lab test that is abnormal or we need to change your treatment, we will call you to review the results.  Testing/Procedures: No test ordered today   Follow-Up: At [facility], you and your health needs are our priority.  As part of our continuing mission to provide you with exceptional heart care, our providers are all part of one team.  This team includes your primary Cardiologist (physician) and Advanced Practice Providers or APPs (Physician Assistants and Nurse Practitioners) who all work together to provide you with the care you need, when you need it.  Your next appointment:   6 week(s)  Provider:   Suzann Riddle, NP

## 2024-09-26 DIAGNOSIS — R2689 Other abnormalities of gait and mobility: Secondary | ICD-10-CM | POA: Diagnosis not present

## 2024-09-26 DIAGNOSIS — R42 Dizziness and giddiness: Secondary | ICD-10-CM | POA: Diagnosis not present

## 2024-09-26 DIAGNOSIS — M4800 Spinal stenosis, site unspecified: Secondary | ICD-10-CM | POA: Diagnosis not present

## 2024-09-26 DIAGNOSIS — H6123 Impacted cerumen, bilateral: Secondary | ICD-10-CM | POA: Diagnosis not present

## 2024-09-26 DIAGNOSIS — M6281 Muscle weakness (generalized): Secondary | ICD-10-CM | POA: Diagnosis not present

## 2024-09-26 DIAGNOSIS — R2681 Unsteadiness on feet: Secondary | ICD-10-CM | POA: Diagnosis not present

## 2024-09-26 DIAGNOSIS — H8112 Benign paroxysmal vertigo, left ear: Secondary | ICD-10-CM | POA: Diagnosis not present

## 2024-09-26 DIAGNOSIS — H9201 Otalgia, right ear: Secondary | ICD-10-CM | POA: Diagnosis not present

## 2024-09-26 DIAGNOSIS — R262 Difficulty in walking, not elsewhere classified: Secondary | ICD-10-CM | POA: Diagnosis not present

## 2024-09-26 DIAGNOSIS — I7300 Raynaud's syndrome without gangrene: Secondary | ICD-10-CM | POA: Diagnosis not present

## 2024-10-01 ENCOUNTER — Telehealth: Payer: Self-pay | Admitting: Cardiology

## 2024-10-01 NOTE — Telephone Encounter (Signed)
*  STAT* If patient is at the pharmacy, call can be transferred to refill team.   1. Which medications need to be refilled? (please list name of each medication and dose if known) atorvastatin  (LIPITOR) 40 MG tablet    2. Would you like to learn more about the convenience, safety, & potential cost savings by using the [facility]?   3. Are you open to using the [facility] (Type [facility].  ).   4. Which pharmacy/location (including street and city if local pharmacy) is medication to be sent to?  [facility] [address] [place] - [address]    5. Do they need a 30 day or 90 day supply? 90 day

## 2024-10-02 MED ORDER — ATORVASTATIN CALCIUM 40 MG PO TABS: 40.0000 mg | ORAL_TABLET | Freq: Every day | ORAL | 3 refills | Status: AC

## 2024-10-02 NOTE — Telephone Encounter (Signed)
 Refill sent.

## 2024-10-03 DIAGNOSIS — H8112 Benign paroxysmal vertigo, left ear: Secondary | ICD-10-CM | POA: Diagnosis not present

## 2024-10-09 DIAGNOSIS — D3132 Benign neoplasm of left choroid: Secondary | ICD-10-CM | POA: Diagnosis not present

## 2024-10-09 DIAGNOSIS — Z961 Presence of intraocular lens: Secondary | ICD-10-CM | POA: Diagnosis not present

## 2024-10-11 ENCOUNTER — Ambulatory Visit: Admitting: Internal Medicine

## 2024-10-11 ENCOUNTER — Encounter: Payer: Self-pay | Admitting: Internal Medicine

## 2024-10-11 VITALS — BP 136/66 | HR 62 | Ht 60.0 in | Wt 96.8 lb

## 2024-10-11 DIAGNOSIS — Z66 Do not resuscitate: Secondary | ICD-10-CM

## 2024-10-11 DIAGNOSIS — S81811A Laceration without foreign body, right lower leg, initial encounter: Secondary | ICD-10-CM

## 2024-10-11 DIAGNOSIS — Z8673 Personal history of transient ischemic attack (TIA), and cerebral infarction without residual deficits: Secondary | ICD-10-CM

## 2024-10-11 MED ORDER — FLUTICASONE PROPIONATE 50 MCG/ACT NA SUSP: 1.0000 | Freq: Every day | NASAL | 1 refills | Status: AC | PRN

## 2024-10-11 NOTE — Progress Notes (Signed)
 Subjective:  Patient ID: [name], female    DOB: 08-22-1927  Age: 89 y.o. MRN: [id number]  CC: The primary encounter diagnosis was DNR (do not resuscitate). Diagnoses of History of CVA (cerebrovascular accident) without residual deficits and Skin tear of lower leg without complication, right, initial encounter were also pertinent to this visit.   HPI [name] presents for  Chief Complaint  Patient presents with   Medical Management of Chronic Issues    3 month follow up    1) RECENT HOSPITALIZATION FOR ACUTE  left vba stroke  which presented with acute onset of slurred speech, stumbling around, right facial and arm numbness and pain in the right shoulder blade FROM OCT 21 TO OCT 23  Hospt from oct 21 to oct 23 : MRI noted Punctate acute infarct in the left thalamus. 2. Possible punctate subacute infarcts in the right internal capsule and right basal ganglia. 3. Moderate chronic small vessel ischemic disease  Dual platelet therapy for 90 days rec with DAILY asa and plavix  .and sent home with ZIO montior.   No change to sttin or antihypertensives. Diagnosed with atrial flutter by ZIO monitor during cardiology follow up,  now taking Eliquis  , Plavix  stopped   2) received  OT and PT  OT completed today, pt until end of month 2/week   3) speech is not slurred but she reports needing to clear her throat more often ,  some PND which has become continuous   4) gashed her leg on the car door  one week ago   5) getting Epley maneuver by ENT for BPPV   Outpatient Medications Prior to Visit  Medication Sig Dispense Refill   acetaminophen  (TYLENOL ) 500 MG tablet Take 500 mg by mouth every 6 (six) hours as needed for mild pain.     apixaban  (ELIQUIS ) 2.5 MG TABS tablet Take 1 tablet (2.5 mg total) by mouth 2 (two) times daily. 180 tablet 3   atorvastatin  (LIPITOR) 40 MG tablet Take 1 tablet (40 mg total) by mouth daily. 90 tablet 3   Calcium  Carbonate (CALCIUM  600 PO)  Take by mouth.     Cholecalciferol  (VITAMIN D3 PO) Take 500 Units by mouth daily.     isosorbide  mononitrate (IMDUR ) 30 MG 24 hr tablet Take 0.5 tablets (15 mg total) by mouth daily.     Lactobacillus (PROBIOTIC ACIDOPHILUS PO) Take 1 capsule by mouth daily.      loratadine  (CLARITIN ) 10 MG tablet Take 10 mg by mouth daily as needed for allergies.     Magnesium  400 MG CAPS Take 400 mg by mouth daily.     Multiple Vitamin (MULTIVITAMIN) capsule Take 1 capsule by mouth daily.     nitroGLYCERIN  (NITROSTAT ) 0.4 MG SL tablet Place 1 tablet (0.4 mg total) under the tongue as needed for chest pain. 30 tablet 0   Omega-3 Fatty Acids (OMEGA 3 PO) Take 520 mg by mouth daily.      Pramoxine-HC (HYDROCORTISONE ACE-PRAMOXINE) 2.5-1 % CREA Apply topically as needed.      triamcinolone  cream (KENALOG ) 0.1 % Apply 1 Application topically 2 (two) times daily. 30 g 0   fluticasone  (FLONASE ) 50 MCG/ACT nasal spray Place 1 spray into both nostrils daily as needed for allergies or rhinitis.     No facility-administered medications prior to visit.    Review of Systems;  Patient denies headache, fevers, malaise, unintentional weight loss, skin rash, eye pain, sinus congestion and sinus pain, sore throat, dysphagia,  hemoptysis , cough, dyspnea, wheezing, chest pain, palpitations, orthopnea, edema, abdominal pain, nausea, melena, diarrhea, constipation, flank pain, dysuria, hematuria, urinary  Frequency, nocturia, numbness, tingling, seizures,  Focal weakness, Loss of consciousness,  Tremor, insomnia, depression, anxiety, and suicidal ideation.      Objective:  BP 136/66   Pulse 62   Ht 5' (1.524 m)   Wt 96 lb 12.8 oz (43.9 kg)   SpO2 95%   BMI 18.90 kg/m   BP Readings from Last 3 Encounters:  10/11/24 136/66  09/25/24 (!) 150/80  08/30/24 (!) 154/71    Wt Readings from Last 3 Encounters:  10/11/24 96 lb 12.8 oz (43.9 kg)  09/25/24 96 lb 4 oz (43.7 kg)  08/28/24 95 lb 7.4 oz (43.3 kg)    Physical  Exam Vitals reviewed.  Constitutional:      General: She is not in acute distress.    Appearance: Normal appearance. She is normal weight. She is not ill-appearing, toxic-appearing or diaphoretic.  HENT:     Head: Normocephalic.  Eyes:     General: No scleral icterus.       Right eye: No discharge.        Left eye: No discharge.     Conjunctiva/sclera: Conjunctivae normal.  Cardiovascular:     Rate and Rhythm: Normal rate and regular rhythm.     Heart sounds: Normal heart sounds.  Pulmonary:     Effort: Pulmonary effort is normal. No respiratory distress.     Breath sounds: Normal breath sounds.  Musculoskeletal:        General: Normal range of motion.  Skin:    General: Skin is warm and dry.     Findings: Wound present.         Comments: Dime sized skin tear with bruising,  no erythema or purulent drainage   Neurological:     General: No focal deficit present.     Mental Status: She is alert and oriented to person, place, and time. Mental status is at baseline.  Psychiatric:        Mood and Affect: Mood normal.        Behavior: Behavior normal.        Thought Content: Thought content normal.        Judgment: Judgment normal.     Lab Results  Component Value Date   HGBA1C 6.3 (H) 08/28/2024   HGBA1C 7.0 (H) 11/16/2021   HGBA1C 6.1 (H) 05/14/2019    Lab Results  Component Value Date   CREATININE 0.46 08/30/2024   CREATININE 0.48 08/29/2024   CREATININE 0.60 08/28/2024    Lab Results  Component Value Date   WBC 5.3 08/29/2024   HGB 14.2 08/29/2024   HCT 43.5 08/29/2024   PLT 187 08/29/2024   GLUCOSE 104 (H) 08/30/2024   CHOL 182 08/29/2024   TRIG 58 08/29/2024   HDL 73 08/29/2024   LDLDIRECT 96.0 03/10/2023   LDLCALC 97 08/29/2024   ALT 18 08/28/2024   AST 37 08/28/2024   NA 138 08/30/2024   K 3.8 08/30/2024   CL 102 08/30/2024   CREATININE 0.46 08/30/2024   BUN 13 08/30/2024   CO2 26 08/30/2024   TSH 1.89 01/03/2024   HGBA1C 6.3 (H) 08/28/2024     US  Venous Img Lower Bilateral (DVT) Result Date: 08/30/2024 CLINICAL DATA:  Embolic stroke EXAM: BILATERAL LOWER EXTREMITY VENOUS DOPPLER ULTRASOUND TECHNIQUE: Gray-scale sonography with graded compression, as well as color Doppler and duplex ultrasound were performed to evaluate the  lower extremity deep venous systems from the level of the common femoral vein and including the common femoral, femoral, profunda femoral, popliteal and calf veins including the posterior tibial, peroneal and gastrocnemius veins when visible. The superficial great saphenous vein was also interrogated. Spectral Doppler was utilized to evaluate flow at rest and with distal augmentation maneuvers in the common femoral, femoral and popliteal veins. COMPARISON:  None Available. FINDINGS: RIGHT LOWER EXTREMITY Common Femoral Vein: No evidence of thrombus. Normal compressibility, respiratory phasicity and response to augmentation. Saphenofemoral Junction: No evidence of thrombus. Normal compressibility and flow on color Doppler imaging. Profunda Femoral Vein: No evidence of thrombus. Normal compressibility and flow on color Doppler imaging. Femoral Vein: No evidence of thrombus. Normal compressibility, respiratory phasicity and response to augmentation. Popliteal Vein: No evidence of thrombus. Normal compressibility, respiratory phasicity and response to augmentation. Calf Veins: No evidence of thrombus. Normal compressibility and flow on color Doppler imaging. Superficial Great Saphenous Vein: No evidence of thrombus. Normal compressibility. Venous Reflux:  None. Other Findings:  None. LEFT LOWER EXTREMITY Common Femoral Vein: No evidence of thrombus. Normal compressibility, respiratory phasicity and response to augmentation. Saphenofemoral Junction: No evidence of thrombus. Normal compressibility and flow on color Doppler imaging. Profunda Femoral Vein: No evidence of thrombus. Normal compressibility and flow on color Doppler  imaging. Femoral Vein: No evidence of thrombus. Normal compressibility, respiratory phasicity and response to augmentation. Popliteal Vein: No evidence of thrombus. Normal compressibility, respiratory phasicity and response to augmentation. Calf Veins: No evidence of thrombus. Normal compressibility and flow on color Doppler imaging. Superficial Great Saphenous Vein: No evidence of thrombus. Normal compressibility. Venous Reflux:  None. Other Findings:  None. IMPRESSION: No evidence of deep venous thrombosis in either lower extremity. Electronically Signed   By: Wilkie Lent M.D.   On: 08/30/2024 14:06    Assessment & Plan:    Problem List Items Addressed This Visit       Unprioritized   History of CVA (cerebrovascular accident) without residual deficits   She has completed PT and OT and her weakness has resolved .  Continue asa,  statin and Xarelto for evidence of PAF on Zio monitor      Skin tear of lower leg without complication, right, initial encounter   Wound examined and without infection.  Instructed to keep wound covered  ,clear daily with soap and water.      Other Visit Diagnoses       DNR (do not resuscitate)    -  Primary   Relevant Orders   Do not attempt resuscitation (DNR)       Follow-up: Return in about 3 months (around 01/09/2025).   Verneita LITTIE Kettering, MD

## 2024-10-11 NOTE — Patient Instructions (Signed)
 I'm glad you are recovering hour strength   Remember to chew your food thoroughly and MOISTEN IT WITH A SIP OF LIQUID BEFORE SWALLOWING   Your wound appears to be healing WITHOUT INFECTION  Keep it COVERED AND PROTECTED FROM BUMPS until it has completely healed

## 2024-10-13 ENCOUNTER — Encounter: Payer: Self-pay | Admitting: Internal Medicine

## 2024-10-13 DIAGNOSIS — S81811A Laceration without foreign body, right lower leg, initial encounter: Secondary | ICD-10-CM | POA: Insufficient documentation

## 2024-10-13 NOTE — Assessment & Plan Note (Signed)
 Wound examined and without infection.  Instructed to keep wound covered  ,clear daily with soap and water.

## 2024-10-13 NOTE — Assessment & Plan Note (Signed)
 She has completed PT and OT and her weakness has resolved .  Continue asa,  statin and Xarelto for evidence of PAF on Zio monitor

## 2024-10-26 ENCOUNTER — Telehealth: Payer: Self-pay | Admitting: Internal Medicine

## 2024-10-26 DIAGNOSIS — L02419 Cutaneous abscess of limb, unspecified: Secondary | ICD-10-CM | POA: Insufficient documentation

## 2024-10-26 MED ORDER — SULFAMETHOXAZOLE-TRIMETHOPRIM 800-160 MG PO TABS: 1.0000 | ORAL_TABLET | Freq: Two times a day (BID) | ORAL | 0 refills | Status: DC

## 2024-10-30 ENCOUNTER — Ambulatory Visit: Attending: Cardiology | Admitting: Cardiology

## 2024-10-30 ENCOUNTER — Encounter: Payer: Self-pay | Admitting: Cardiology

## 2024-10-30 VITALS — BP 112/62 | HR 77 | Ht 60.0 in | Wt 94.0 lb

## 2024-10-30 DIAGNOSIS — I493 Ventricular premature depolarization: Secondary | ICD-10-CM

## 2024-10-30 DIAGNOSIS — I4892 Unspecified atrial flutter: Secondary | ICD-10-CM | POA: Diagnosis not present

## 2024-10-30 DIAGNOSIS — I10 Essential (primary) hypertension: Secondary | ICD-10-CM | POA: Diagnosis not present

## 2024-10-30 NOTE — Patient Instructions (Signed)
 Medication Instructions:  - STOP imdur    *If you need a refill on your cardiac medications before your next appointment, please call your pharmacy*  Lab Work: No labs ordered today  If you have labs (blood work) drawn today and your tests are completely normal, you will receive your results only by: MyChart Message (if you have MyChart) OR A paper copy in the mail If you have any lab test that is abnormal or we need to change your treatment, we will call you to review the results.  Testing/Procedures: No test ordered today   Follow-Up: At [facility], you and your health needs are our priority.  As part of our continuing mission to provide you with exceptional heart care, our providers are all part of one team.  This team includes your primary Cardiologist (physician) and Advanced Practice Providers or APPs (Physician Assistants and Nurse Practitioners) who all work together to provide you with the care you need, when you need it.  Your next appointment:   6 month(s)  Provider:   You may see Redell Cave, MD or one of the following Advanced Practice Providers on your designated Care Team:   Lonni Meager, NP Lesley Maffucci, PA-C Bernardino Bring, PA-C Cadence [place], PA-C Tylene Lunch, NP Barnie Hila, NP    We recommend signing up for the patient portal called MyChart.  Sign up information is provided on this After Visit Summary.  MyChart is used to connect with patients for Virtual Visits (Telemedicine).  Patients are able to view lab/test results, encounter notes, upcoming appointments, etc.  Non-urgent messages can be sent to your provider as well.   To learn more about what you can do with MyChart, go to [email or web address].

## 2024-10-30 NOTE — Progress Notes (Signed)
 " Cardiology Office Note:    Date:  10/30/2024   ID:  [name], DOB [birth year], MRN [id number]  PCP:  Marylynn Verneita CROME, MD  [facility] Cardiologist:  Redell Cave, MD  [facility] Electrophysiologist:  None   Referring MD: Marylynn Verneita CROME, MD   Chief Complaint  Patient presents with   Follow-up    3 month follow up pt has been doing well with no complaints of chest pain, chest pressure or SOB, medciation reviewed verbally with patient    History of Present Illness:    [name] is a 89 y.o. female with a hx of atrial flutter, hypertension, frequent PVCs, CKD, celiac artery stenosis s/p stenting, CVA who presents for follow-up.   Last seen due to symptoms of fatigue, lightheadedness, generalized weakness.  Toprol -XL previously stopped. Norvasc  was discontinued after last visit, and Imdur  reduced to 15 mg daily.  States symptoms overall have improved.  Admitted to the hospital 08/2024 with right upper extremity numbness.  Discharged with a cardiac monitor unrevealing paroxysmal atrial flutter.  Started on Eliquis  2.5 mg twice daily.  Tolerating Eliquis  with no bleeding issues.  Still has some right upper and lower extremity weakness, undergoing rehab, uses a walker, denies any falls.  Prior notes Echo 10/25 EF 60 to 65% Cardiac monitor 06/2021 frequent PVCs 12.2% burden Echo 06/2021 EF 60 to 65%, impaired relaxation. Lexiscan  Myoview  04/2020, no evidence for ischemia, low risk study.   Past Medical History:  Diagnosis Date   Acute COVID-19 06/04/2021   Allergy    Arthritis    Atherosclerosis    Atherosclerosis of abdominal aorta    Chronic kidney disease    stage 2   CKD (chronic kidney disease) stage 2, GFR 60-89 ml/min    Collagen vascular disease    Compression fracture of L2 lumbar vertebra (HCC) 08/15/2015   Compression fracture of L4 vertebra (HCC) 12/10/2016   IMO SNOMED Dx Update Oct 2024     Compression fracture of thoracic spine,  non-traumatic (HCC) 08/19/2015   Seen on plain films October 2016   Diverticulosis 2008   GERD (gastroesophageal reflux disease)    Heart murmur    Hyperlipidemia    Hypertensive urgency 05/04/2022   Lumbar spinal stenosis    Osteoporosis    Ovarian lump    RA (rheumatoid arthritis) (HCC)    Raynaud's disease    Rheumatoid arthritis (HCC)    Spinal stenosis     Past Surgical History:  Procedure Laterality Date   BREAST SURGERY Left 1965   Benign biopsy   CHOLECYSTECTOMY  2005   [place]   COLONOSCOPY  2008   Dr. Luellen   EXCISION OF BREAST BIOPSY Left 1965   benign   EYE SURGERY Bilateral    Cataract Extraction with IOL   KYPHOPLASTY N/A 12/17/2016   Procedure: KYPHOPLASTY;  Surgeon: Ozell Flake, MD;  Location: [facility] ORS;  Service: Orthopedics;  Laterality: N/A;  l4   LUMBAR LAMINECTOMY  05/09/2017   L2-L5   VISCERAL ANGIOGRAPHY N/A 08/15/2017   Procedure: VISCERAL ANGIOGRAPHY;  Surgeon: Marea Selinda RAMAN, MD;  Location: [facility] INVASIVE CV LAB;  Service: Cardiovascular;  Laterality: N/A;   VISCERAL ANGIOGRAPHY N/A 10/05/2017   Procedure: VISCERAL ANGIOGRAPHY;  Surgeon: Marea Selinda RAMAN, MD;  Location: [facility] INVASIVE CV LAB;  Service: Cardiovascular;  Laterality: N/A;   VISCERAL ARTERY INTERVENTION N/A 08/15/2017   Procedure: VISCERAL ARTERY INTERVENTION;  Surgeon: Marea Selinda RAMAN, MD;  Location: [facility] INVASIVE CV LAB;  Service:  Cardiovascular;  Laterality: N/A;    Current Medications: Current Meds  Medication Sig   acetaminophen  (TYLENOL ) 500 MG tablet Take 500 mg by mouth every 6 (six) hours as needed for mild pain.   apixaban  (ELIQUIS ) 2.5 MG TABS tablet Take 1 tablet (2.5 mg total) by mouth 2 (two) times daily.   atorvastatin  (LIPITOR) 40 MG tablet Take 1 tablet (40 mg total) by mouth daily.   Calcium  Carbonate (CALCIUM  600 PO) Take by mouth.   Cholecalciferol  (VITAMIN D3 PO) Take 500 Units by mouth daily.   fluticasone  (FLONASE ) 50 MCG/ACT nasal spray Place 1 spray into both  nostrils daily as needed for allergies or rhinitis.   Lactobacillus (PROBIOTIC ACIDOPHILUS PO) Take 1 capsule by mouth daily.    loratadine  (CLARITIN ) 10 MG tablet Take 10 mg by mouth daily as needed for allergies.   Magnesium  400 MG CAPS Take 400 mg by mouth daily.   Multiple Vitamin (MULTIVITAMIN) capsule Take 1 capsule by mouth daily.   nitroGLYCERIN  (NITROSTAT ) 0.4 MG SL tablet Place 1 tablet (0.4 mg total) under the tongue as needed for chest pain.   Omega-3 Fatty Acids (OMEGA 3 PO) Take 520 mg by mouth daily.    Pramoxine-HC (HYDROCORTISONE ACE-PRAMOXINE) 2.5-1 % CREA Apply topically as needed.    sulfamethoxazole -trimethoprim  (BACTRIM  DS) 800-160 MG tablet Take 1 tablet by mouth 2 (two) times daily.   triamcinolone  cream (KENALOG ) 0.1 % Apply 1 Application topically 2 (two) times daily.   [DISCONTINUED] isosorbide  mononitrate (IMDUR ) 30 MG 24 hr tablet Take 0.5 tablets (15 mg total) by mouth daily.     Allergies:   Ibandronate, Other, Prednisone, Actonel [risedronate], Amoxicillin , Pantoprazole , Raloxifene, Versed  [midazolam ], and Doxycycline   Social History   Socioeconomic History   Marital status: Widowed    Spouse name: Elsie   Number of children: 3   Years of education: some college   Highest education level: 12th grade  Occupational History    Employer: RETIRED  Tobacco Use   Smoking status: Never   Smokeless tobacco: Never   Tobacco comments:    smoking cessation materials not required  Vaping Use   Vaping status: Never Used  Substance and Sexual Activity   Alcohol use: No    Alcohol/week: 0.0 standard drinks of alcohol   Drug use: No   Sexual activity: Not Currently  Other Topics Concern   Not on file  Social History Narrative   Widowed   3 daughter   Social Drivers of Health   Tobacco Use: Low Risk (10/30/2024)   Patient History    Smoking Tobacco Use: Never    Smokeless Tobacco Use: Never    Passive Exposure: Not on file  Financial Resource Strain:  Low Risk (10/10/2024)   Overall Financial Resource Strain (CARDIA)    Difficulty of Paying Living Expenses: Not hard at all  Food Insecurity: No Food Insecurity (10/10/2024)   Epic    Worried About Radiation Protection Practitioner of Food in the Last Year: Never true    Ran Out of Food in the Last Year: Never true  Transportation Needs: No Transportation Needs (10/10/2024)   Epic    Lack of Transportation (Medical): No    Lack of Transportation (Non-Medical): No  Physical Activity: Sufficiently Active (10/10/2024)   Exercise Vital Sign    Days of Exercise per Week: 6 days    Minutes of Exercise per Session: 30 min  Stress: No Stress Concern Present (10/10/2024)   [employer] of Occupational Health - Occupational Stress Questionnaire  Feeling of Stress: Only a little  Social Connections: Moderately Isolated (10/10/2024)   Social Connection and Isolation Panel    Frequency of Communication with Friends and Family: More than three times a week    Frequency of Social Gatherings with Friends and Family: More than three times a week    Attends Religious Services: More than 4 times per year    Active Member of [employer] or Organizations: No    Attends Banker Meetings: Not on file    Marital Status: Widowed  Depression (PHQ2-9): Low Risk (10/11/2024)   Depression (PHQ2-9)    PHQ-2 Score: 0  Alcohol Screen: Low Risk (05/22/2024)   Alcohol Screen    Last Alcohol Screening Score (AUDIT): 0  Housing: Unknown (10/10/2024)   Epic    Unable to Pay for Housing in the Last Year: No    Number of Times Moved in the Last Year: Not on file    Homeless in the Last Year: No  Utilities: Not At Risk (08/29/2024)   Epic    Threatened with loss of utilities: No  Health Literacy: Inadequate Health Literacy (05/22/2024)   B1300 Health Literacy    Frequency of need for help with medical instructions: Sometimes     Family History: The patient's family history includes Alcohol abuse in her sister; Cancer in her  brother and brother; Diabetes in her brother, maternal grandmother, and sister; Heart disease in her maternal grandmother and mother; Heart disease (age of onset: 55) in her brother; Hypertension in her father; Kidney disease in her sister; Stroke in her brother.  ROS:   Please see the history of present illness.     All other systems reviewed and are negative.  EKGs/Labs/Other Studies Reviewed:    The following studies were reviewed today:   Recent Labs: 01/03/2024: TSH 1.89 08/28/2024: ALT 18 08/29/2024: Hemoglobin 14.2; Magnesium  2.3; Platelets 187 08/30/2024: BUN 13; Creatinine, Ser 0.46; Potassium 3.8; Sodium 138  Recent Lipid Panel    Component Value Date/Time   CHOL 182 08/29/2024 0259   CHOL 205 (H) 12/19/2015 1422   TRIG 58 08/29/2024 0259   HDL 73 08/29/2024 0259   HDL 84 12/19/2015 1422   CHOLHDL 2.5 08/29/2024 0259   VLDL 12 08/29/2024 0259   LDLCALC 97 08/29/2024 0259   LDLCALC 73 07/03/2024 1356   LDLDIRECT 96.0 03/10/2023 1214    Physical Exam:    VS:  BP 112/62 (BP Location: Left Arm, Patient Position: Sitting, Cuff Size: Normal)   Pulse 77   Ht 5' (1.524 m)   Wt 94 lb (42.6 kg)   SpO2 97%   BMI 18.36 kg/m     Wt Readings from Last 3 Encounters:  10/30/24 94 lb (42.6 kg)  10/11/24 96 lb 12.8 oz (43.9 kg)  09/25/24 96 lb 4 oz (43.7 kg)     GEN:  Well nourished, appears frail, no acute distress. HEENT: Normal NECK: No JVD; No carotid bruits CARDIAC: RRR, no murmurs, rubs, gallops RESPIRATORY:  Clear to auscultation without rales, wheezing or rhonchi  ABDOMEN: Soft, non-tender, non-distended MUSCULOSKELETAL:  No edema; No deformity  SKIN: Warm and dry NEUROLOGIC:  Alert and oriented x 3 PSYCHIATRIC:  Normal affect   ASSESSMENT:    1. Atrial flutter, unspecified type (HCC)   2. Frequent PVCs   3. Primary hypertension    PLAN:    In order of problems listed above:  Paroxysmal atrial flutter, recent CVA.  Continue Eliquis  2.5 mg twice  daily. Frequent PVCs,  palpitations, EF 60 to 65%.  Toprol -XL previously stopped due to symptoms of fatigue, bradycardia. Current heart rate 60s.  Continue to monitor off AV nodal agents. Hypertension, BP controlled..  Symptoms of fatigue overall improved but still present. Stop Imdur . Okay to let BP run high, 140s-150s okay.   Follow-up in 6 months.   Medication Adjustments/Labs and Tests Ordered: Current medicines are reviewed at length with the patient today.  Concerns regarding medicines are outlined above.  No orders of the defined types were placed in this encounter.   No orders of the defined types were placed in this encounter.    Patient Instructions  Medication Instructions:  - STOP imdur    *If you need a refill on your cardiac medications before your next appointment, please call your pharmacy*  Lab Work: No labs ordered today  If you have labs (blood work) drawn today and your tests are completely normal, you will receive your results only by: MyChart Message (if you have MyChart) OR A paper copy in the mail If you have any lab test that is abnormal or we need to change your treatment, we will call you to review the results.  Testing/Procedures: No test ordered today   Follow-Up: At [facility], you and your health needs are our priority.  As part of our continuing mission to provide you with exceptional heart care, our providers are all part of one team.  This team includes your primary Cardiologist (physician) and Advanced Practice Providers or APPs (Physician Assistants and Nurse Practitioners) who all work together to provide you with the care you need, when you need it.  Your next appointment:   6 month(s)  Provider:   You may see Redell Cave, MD or one of the following Advanced Practice Providers on your designated Care Team:   Lonni Meager, NP Lesley Maffucci, PA-C Bernardino Bring, PA-C Cadence [address], PA-C Tylene Lunch, NP Barnie Hila, NP    We recommend signing up for the patient portal called MyChart.  Sign up information is provided on this After Visit Summary.  MyChart is used to connect with patients for Virtual Visits (Telemedicine).  Patients are able to view lab/test results, encounter notes, upcoming appointments, etc.  Non-urgent messages can be sent to your provider as well.   To learn more about what you can do with MyChart, go to [email or web address].             Signed, Redell Cave, MD  10/30/2024 3:37 PM    [facility] "

## 2024-11-05 ENCOUNTER — Telehealth: Payer: Self-pay

## 2024-11-05 NOTE — Progress Notes (Unsigned)
 "     Electrophysiology Clinic Note    Date:  11/06/2024  Patient ID:  [name], [name] [birth year], MRN [id number] PCP:  Marylynn Verneita CROME, MD  Cardiologist:  Redell Cave, MD  Electrophysiology APP:  Caden Fukushima, NP     Discussed the use of AI scribe software for clinical note transcription with the patient, who gave verbal consent to proceed.   Patient Profile    Chief Complaint: CVA, aflutter  History of Present Illness: [name] is a 89 y.o. female with PMH notable for HTN, PVCs, CKD, celiac artery stenosis s/p stenting, RA; seen today for EP follow-up  She was hospitalized 08/2024 with acute onset RUE numbness. She was discharged with zio monitor which noted brief episodes of atrial flutter and was recommended she start on 2.5mg  eliquis . I saw her 09/2024 where she had not started eliquis  and so her ASA/plavix  was stopped and eliquis  started.   She saw Dr. Budd last week where she was doing well. Isosorbide  stopped d/t fatigue.   On follow-up today, she and her daughter endorse patient has fallen about 3 times since our last visit. Most recent fall was 2 days ago, tripped over [employer] on the floor they were cleaning up. She did hit the side of her head. No mental status changes, no HA, visual changes. She has wound to the back of her L knee from this fall She continues to take eliquis  BID, no bleeding concerns.  She continues to work with PT and does not appreciate any improvement in her balance, though PT does. She recently saw ENT for vertigo, this dizziness has improved.    Arrhythmia/Device History No specialty comments available.    ROS:  Please see the history of present illness. All other systems are reviewed and otherwise negative.    Physical Exam    VS:  BP 128/72 (BP Location: Left Arm, Patient Position: Sitting, Cuff Size: Normal)   Pulse 65   Ht 5' (1.524 m)   Wt 93 lb 9.6 oz (42.5 kg)   SpO2 95%   BMI 18.28 kg/m  BMI:  Body mass index is 18.28 kg/m.           Wt Readings from Last 3 Encounters:  11/06/24 93 lb 9.6 oz (42.5 kg)  10/30/24 94 lb (42.6 kg)  10/11/24 96 lb 12.8 oz (43.9 kg)     GEN- The patient appears stated age, thin, alert and oriented x 3 today.   Lungs- Clear to ausculation bilaterally, normal work of breathing.  Heart- Regular rate and rhythm, no murmurs, rubs or gallops Extremities- No peripheral edema, warm, dry. 2 healing lacerations to L shin, both appear to be healing well with s/s of infection. 1 abrasion to L back of knee, bandaid applied.   Studies Reviewed   Previous EP, cardiology notes.    EKG is not ordered. Personal review of EKG from 09/25/2024 shows:  SR at 62       Long term monitor, 09/19/2024 Average heart rate 66, range 44-119. Brief episode of atrial flutter noted. Some episodes of SVT with actually atrial flutter. Occasional PVCs, 2.1% burden. With recent history of stroke, recommend starting Eliquis  2.5 mg twice daily (stop aspirin , Plavix  with starting Eliquis .)  Bubble TTE, 08/29/2024 1. Left ventricular ejection fraction, by estimation, is 60 to 65%. The left ventricle has normal function. The left ventricle has no regional wall motion abnormalities. There is mild left ventricular hypertrophy. Left ventricular diastolic parameters are consistent  with Grade I diastolic dysfunction (impaired relaxation).   2. Right ventricular systolic function is normal. The right ventricular size is normal.   3. The mitral valve is normal in structure. Mild mitral valve regurgitation.   4. The aortic valve is tricuspid. There is mild thickening of the aortic valve. Aortic valve regurgitation is trivial.   5. Agitated saline contrast bubble study was negative, with no evidence of any interatrial shunt.    Assessment and Plan     #) aflutter No symptomatic burden, very low burden on recent 2 week zio Continue to monitor   #) Hypercoag d/t aflutter #)  CVA #) recurrent falls CHA2DS2-VASc Score = at least 7 [CHF History: 0, HTN History: 1, Diabetes History: 0, Stroke History: 2, Vascular Disease History: 1, Age Score: 2, Gender Score: 1].  Therefore, the patient's annual risk of stroke is 11.2 %.    Stroke ppx - 2.5mg  eliquis  BID Long discussion with patient and daughter regarding balancing falls with reducing stroke risk with aflutter She continues to work with PT on balance We discussed ensuring safe, clutter-free area when walking Use rollator when ambulating, ask for assistance from family She is not candidate for LAAO with watchman d/t advanced age  At this time, patient and daughter wish to continue eliquis . Will reassess regularly at follow-up Update CBC      Current medicines are reviewed at length with the patient today.   The patient does not have concerns regarding her medicines.  The following changes were made today:   none  Labs/ tests ordered today include:  Orders Placed This Encounter  Procedures   CBC     Disposition: Follow up with Dr. Kennyth or EP APP in 12 months Follow-up with general cardiology in 6 months as previously recommended   Signed, Arra Connaughton, NP  11/06/2024  1:24 PM  Electrophysiology [facility] "

## 2024-11-05 NOTE — Telephone Encounter (Signed)
 Copied from CRM #[id number]. Topic: Clinical - Medical Advice >> Nov 05, 2024 11:43 AM Burnard DEL wrote: Reason for CRM: Patient was prescribed antibiotic sulfamethoxazole -trimethoprim  (BACTRIM  DS) 800-160 MG tablet  for leg wound.She has finished it,however the wound is red and sore. Daughter would like to know if Dr Marylynn thinks she needs another round of antibiotics? Please advise.

## 2024-11-06 ENCOUNTER — Ambulatory Visit: Attending: Cardiology | Admitting: Cardiology

## 2024-11-06 ENCOUNTER — Telehealth: Payer: Self-pay

## 2024-11-06 ENCOUNTER — Encounter: Payer: Self-pay | Admitting: Cardiology

## 2024-11-06 VITALS — BP 128/72 | HR 65 | Ht 60.0 in | Wt 93.6 lb

## 2024-11-06 DIAGNOSIS — I4892 Unspecified atrial flutter: Secondary | ICD-10-CM | POA: Diagnosis not present

## 2024-11-06 DIAGNOSIS — I639 Cerebral infarction, unspecified: Secondary | ICD-10-CM | POA: Diagnosis not present

## 2024-11-06 DIAGNOSIS — R296 Repeated falls: Secondary | ICD-10-CM

## 2024-11-06 DIAGNOSIS — D6869 Other thrombophilia: Secondary | ICD-10-CM

## 2024-11-06 NOTE — Telephone Encounter (Signed)
 If okay to reinstate PT I will print last OV and fax to [facility].

## 2024-11-06 NOTE — Patient Instructions (Signed)
 Medication Instructions:  Your physician recommends that you continue on your current medications as directed. Please refer to the Current Medication list given to you today.  *If you need a refill on your cardiac medications before your next appointment, please call your pharmacy*  Lab Work: Your provider would like for you to have following labs drawn today CBC.     Testing/Procedures: No test ordered today   Follow-Up: At [facility], you and your health needs are our priority.  As part of our continuing mission to provide you with exceptional heart care, our providers are all part of one team.  This team includes your primary Cardiologist (physician) and Advanced Practice Providers or APPs (Physician Assistants and Nurse Practitioners) who all work together to provide you with the care you need, when you need it.  Your next appointment:   1 year(s)  Provider:   Suzann Riddle, NP

## 2024-11-06 NOTE — Telephone Encounter (Signed)
 Called number to get fax number. Unable to leave vm

## 2024-11-06 NOTE — Telephone Encounter (Signed)
 Left message for patient to give our office a call back to get scheduled for an appointment in January per Dr Marylynn, MD request.   OK for E2C2 to get patient scheduled if they return call to office. If scheduled, please notify the office.

## 2024-11-06 NOTE — Telephone Encounter (Signed)
 Copied from CRM #[id number]. Topic: Clinical - Medical Advice >> Nov 05, 2024 11:43 AM Burnard DEL wrote: Reason for CRM: Patient was prescribed antibiotic sulfamethoxazole -trimethoprim  (BACTRIM  DS) 800-160 MG tablet  for leg wound.She has finished it,however the wound is red and sore. Daughter would like to know if Dr Marylynn thinks she needs another round of antibiotics? Please advise. >> Nov 06, 2024  2:38 PM Montie POUR wrote: There is no appointment available with Dr. Marylynn until December 03, 2024. Ms. Vitelli would like to see if she can be worked in sooner with Dr. Marylynn. Please call her back at [phone]. Thanks  I spoke with patient's daughter, Devere Miu, who states patient saw Chantal Needle, AGNP-C, at [facility] at [place] today for her regular visit and looked at patient's leg.  Devere states Suzann Riddle states she will reach out to Dr. Verneita Marylynn regarding her visit with the patient today.  I scheduled patient for a follow-up with Dr. Tullo on 11/16/2024 for her leg wound.

## 2024-11-06 NOTE — Telephone Encounter (Signed)
 Copied from CRM #[id number]. Topic: Clinical - Home Health Verbal Orders >> Nov 05, 2024  2:54 PM Alfonso ORN wrote: Caller/Agency: [facility] Callback Number: [phone] Service Requested: Physical Therapy Frequency: pt services will end 11/2024 due to insurance change and requesting to be reinstated after for PT , will need last visit notes faxed over Any new concerns about the patient? No

## 2024-11-07 ENCOUNTER — Ambulatory Visit: Payer: Self-pay | Admitting: Cardiology

## 2024-11-07 LAB — CBC
Hematocrit: 44.6 % (ref 34.0–46.6)
Hemoglobin: 14.5 g/dL (ref 11.1–15.9)
MCH: 29.8 pg (ref 26.6–33.0)
MCHC: 32.5 g/dL (ref 31.5–35.7)
MCV: 92 fL (ref 79–97)
Platelets: 222 x10E3/uL (ref 150–450)
RBC: 4.87 x10E6/uL (ref 3.77–5.28)
RDW: 12.3 % (ref 11.7–15.4)
WBC: 5 x10E3/uL (ref 3.4–10.8)

## 2024-11-07 NOTE — Telephone Encounter (Signed)
 Called and spoke with Jon and she informed me that the pts insurance is changing so they have to discharge her this week and then reinstate her. She provided me with the fax number where I faxed over her LOV to [phone]

## 2024-11-07 NOTE — Telephone Encounter (Signed)
 noted

## 2024-11-13 ENCOUNTER — Ambulatory Visit: Admitting: Internal Medicine

## 2024-11-13 ENCOUNTER — Encounter: Payer: Self-pay | Admitting: Internal Medicine

## 2024-11-13 VITALS — BP 132/70 | Ht 60.0 in | Wt 96.0 lb

## 2024-11-13 DIAGNOSIS — L97901 Non-pressure chronic ulcer of unspecified part of unspecified lower leg limited to breakdown of skin: Secondary | ICD-10-CM

## 2024-11-13 DIAGNOSIS — R296 Repeated falls: Secondary | ICD-10-CM | POA: Diagnosis not present

## 2024-11-13 DIAGNOSIS — L97909 Non-pressure chronic ulcer of unspecified part of unspecified lower leg with unspecified severity: Secondary | ICD-10-CM | POA: Insufficient documentation

## 2024-11-13 MED ORDER — SULFAMETHOXAZOLE-TRIMETHOPRIM 800-160 MG PO TABS: 1.0000 | ORAL_TABLET | Freq: Two times a day (BID) | ORAL | 0 refills | Status: AC

## 2024-11-13 NOTE — Progress Notes (Signed)
 "  Subjective:  Patient ID: [name], female    DOB: Jun 17, 1927  Age: 89 y.o. MRN: [id number]  CC: The primary encounter diagnosis was Recurrent falls. A diagnosis of Ulcer of lower extremity, limited to breakdown of skin, unspecified laterality (HCC) was also pertinent to this visit.   HPI [name] presents for  Chief Complaint  Patient presents with   Wound Check    Wounds on both legs   [name] is a 89 yr old female with extensive varicosities of lower extremities ,  venous insufficiency  abd PAD who presents with recurrent leg ulcers which have been occurring in part due to mechanical falls at home,  she was treated empirically with Septra  DS on Dec 19 for one week for wound infection/cellulitis but has had persistent redness and new wounds since then .[name]    She was recently admitted to [facility] in October with RUE weakness and diagnosed with embolic CVA.  She is now taking Eliquis   since November 18    for ZIO proven atrial flutter.  She has had 3 falls since then, 2 since Christmas . d  She is receiving physical therapy to improve her balance .  She complains of being constantly weak and dizzy since her stroke and she has had some deterioration of memory, but continues to do things unsupervised that result in falls,  er daughters are with her most hours of the day.    Outpatient Medications Prior to Visit  Medication Sig Dispense Refill   acetaminophen  (TYLENOL ) 500 MG tablet Take 500 mg by mouth every 6 (six) hours as needed for mild pain.     apixaban  (ELIQUIS ) 2.5 MG TABS tablet Take 1 tablet (2.5 mg total) by mouth 2 (two) times daily. 180 tablet 3   atorvastatin  (LIPITOR) 40 MG tablet Take 1 tablet (40 mg total) by mouth daily. 90 tablet 3   Calcium  Carbonate (CALCIUM  600 PO) Take by mouth.     Cholecalciferol  (VITAMIN D3 PO) Take 500 Units by mouth daily.     erythromycin ophthalmic ointment SMARTSIG:In Eye(s)     fluticasone  (FLONASE ) 50 MCG/ACT nasal spray Place  1 spray into both nostrils daily as needed for allergies or rhinitis. 16 g 1   Lactobacillus (PROBIOTIC ACIDOPHILUS PO) Take 1 capsule by mouth daily.      loratadine  (CLARITIN ) 10 MG tablet Take 10 mg by mouth daily as needed for allergies.     Magnesium  400 MG CAPS Take 400 mg by mouth daily.     Multiple Vitamin (MULTIVITAMIN) capsule Take 1 capsule by mouth daily.     nitroGLYCERIN  (NITROSTAT ) 0.4 MG SL tablet Place 1 tablet (0.4 mg total) under the tongue as needed for chest pain. 30 tablet 0   Omega-3 Fatty Acids (OMEGA 3 PO) Take 520 mg by mouth daily.      Pramoxine-HC (HYDROCORTISONE ACE-PRAMOXINE) 2.5-1 % CREA Apply topically as needed.      triamcinolone  cream (KENALOG ) 0.1 % Apply 1 Application topically 2 (two) times daily. 30 g 0   sulfamethoxazole -trimethoprim  (BACTRIM  DS) 800-160 MG tablet Take 1 tablet by mouth 2 (two) times daily. (Patient not taking: Reported on 11/13/2024) 14 tablet 0   No facility-administered medications prior to visit.    Review of Systems;  Patient denies headache, fevers, malaise, unintentional weight loss, skin rash, eye pain, sinus congestion and sinus pain, sore throat, dysphagia,  hemoptysis , cough, dyspnea, wheezing, chest pain, palpitations, orthopnea, edema, abdominal pain, nausea, melena, diarrhea, constipation,  flank pain, dysuria, hematuria, urinary  Frequency, nocturia, numbness, tingling, seizures,  Focal weakness, Loss of consciousness,  Tremor, insomnia, depression, anxiety, and suicidal ideation.      Objective:  BP 132/70   Ht 5' (1.524 m)   Wt 96 lb (43.5 kg)   BMI 18.75 kg/m   BP Readings from Last 3 Encounters:  11/13/24 132/70  11/06/24 128/72  10/30/24 112/62    Wt Readings from Last 3 Encounters:  11/13/24 96 lb (43.5 kg)  11/06/24 93 lb 9.6 oz (42.5 kg)  10/30/24 94 lb (42.6 kg)    Physical Exam Vitals reviewed.  Constitutional:      General: She is not in acute distress.    Appearance: Normal appearance. She  is normal weight. She is not ill-appearing, toxic-appearing or diaphoretic.  HENT:     Head: Normocephalic.  Eyes:     General: No scleral icterus.       Right eye: No discharge.        Left eye: No discharge.     Conjunctiva/sclera: Conjunctivae normal.  Cardiovascular:     Rate and Rhythm: Normal rate and regular rhythm.     Heart sounds: Normal heart sounds.  Pulmonary:     Effort: Pulmonary effort is normal. No respiratory distress.     Breath sounds: Normal breath sounds.  Musculoskeletal:        General: Normal range of motion.  Skin:    General: Skin is warm and dry.     Findings: Wound present.         Comments: Small traumatic wounds   Neurological:     General: No focal deficit present.     Mental Status: She is alert and oriented to person, place, and time. Mental status is at baseline.  Psychiatric:        Mood and Affect: Mood normal.        Behavior: Behavior normal.        Thought Content: Thought content normal.        Judgment: Judgment normal.     Lab Results  Component Value Date   HGBA1C 6.3 (H) 08/28/2024   HGBA1C 7.0 (H) 11/16/2021   HGBA1C 6.1 (H) 05/14/2019    Lab Results  Component Value Date   CREATININE 0.46 08/30/2024   CREATININE 0.48 08/29/2024   CREATININE 0.60 08/28/2024    Lab Results  Component Value Date   WBC 5.0 11/06/2024   HGB 14.5 11/06/2024   HCT 44.6 11/06/2024   PLT 222 11/06/2024   GLUCOSE 104 (H) 08/30/2024   CHOL 182 08/29/2024   TRIG 58 08/29/2024   HDL 73 08/29/2024   LDLDIRECT 96.0 03/10/2023   LDLCALC 97 08/29/2024   ALT 18 08/28/2024   AST 37 08/28/2024   NA 138 08/30/2024   K 3.8 08/30/2024   CL 102 08/30/2024   CREATININE 0.46 08/30/2024   BUN 13 08/30/2024   CO2 26 08/30/2024   TSH 1.89 01/03/2024   HGBA1C 6.3 (H) 08/28/2024    US  Venous Img Lower Bilateral (DVT) Result Date: 08/30/2024 CLINICAL DATA:  Embolic stroke EXAM: BILATERAL LOWER EXTREMITY VENOUS DOPPLER ULTRASOUND TECHNIQUE:  Gray-scale sonography with graded compression, as well as color Doppler and duplex ultrasound were performed to evaluate the lower extremity deep venous systems from the level of the common femoral vein and including the common femoral, femoral, profunda femoral, popliteal and calf veins including the posterior tibial, peroneal and gastrocnemius veins when visible. The superficial great saphenous vein was  also interrogated. Spectral Doppler was utilized to evaluate flow at rest and with distal augmentation maneuvers in the common femoral, femoral and popliteal veins. COMPARISON:  None Available. FINDINGS: RIGHT LOWER EXTREMITY Common Femoral Vein: No evidence of thrombus. Normal compressibility, respiratory phasicity and response to augmentation. Saphenofemoral Junction: No evidence of thrombus. Normal compressibility and flow on color Doppler imaging. Profunda Femoral Vein: No evidence of thrombus. Normal compressibility and flow on color Doppler imaging. Femoral Vein: No evidence of thrombus. Normal compressibility, respiratory phasicity and response to augmentation. Popliteal Vein: No evidence of thrombus. Normal compressibility, respiratory phasicity and response to augmentation. Calf Veins: No evidence of thrombus. Normal compressibility and flow on color Doppler imaging. Superficial Great Saphenous Vein: No evidence of thrombus. Normal compressibility. Venous Reflux:  None. Other Findings:  None. LEFT LOWER EXTREMITY Common Femoral Vein: No evidence of thrombus. Normal compressibility, respiratory phasicity and response to augmentation. Saphenofemoral Junction: No evidence of thrombus. Normal compressibility and flow on color Doppler imaging. Profunda Femoral Vein: No evidence of thrombus. Normal compressibility and flow on color Doppler imaging. Femoral Vein: No evidence of thrombus. Normal compressibility, respiratory phasicity and response to augmentation. Popliteal Vein: No evidence of thrombus. Normal  compressibility, respiratory phasicity and response to augmentation. Calf Veins: No evidence of thrombus. Normal compressibility and flow on color Doppler imaging. Superficial Great Saphenous Vein: No evidence of thrombus. Normal compressibility. Venous Reflux:  None. Other Findings:  None. IMPRESSION: No evidence of deep venous thrombosis in either lower extremity. Electronically Signed   By: Wilkie Lent M.D.   On: 08/30/2024 14:06    Assessment & Plan:  .Recurrent falls Assessment & Plan:  In light of recurrent falls,  3 since Nov 3 i had a lengthy  discussion today with patient and daughters about the risks of continuining eliquis .  Daughters and patient understand the risk of embolic CVA without Eliquis  as well as the risk of SAH /ICH if she were to fall and strike her head.  I have advised patient and family to discontinue Eliquis     Ulcer of lower extremity, limited to breakdown of skin, unspecified laterality [facility]) Assessment & Plan: Traumatic wounds in near proximity to significant varicosities.  ABI's reviewed,  and normal in Sept 2025. Advised to keep wounds covered and protect legs.with thick stockings    Other orders -     Sulfamethoxazole -Trimethoprim ; Take 1 tablet by mouth 2 (two) times daily.  Dispense: 14 tablet; Refill: 0     I spent 44 minutes on the day of this face to face encounter reviewing patient's  most recent visit with cardiology,   recent  labs and imaging studies, counseling on the risks and benefits of continued use of anticoagulants,  reviewing the assessment and plan with patient, and post visit ordering and reviewing of  diagnostics and therapeutics with patient  .   Follow-up: Return in about 3 months (around 02/11/2025).   Verneita LITTIE Kettering, MD "

## 2024-11-13 NOTE — Patient Instructions (Signed)
 Your wounds are not infected.  Continue to monitor the red rig around each wound and if the ring gets larger,  start the Septra  and notify me   I am recommending that you STRONGLY CONSIDER stopping Eliquis   given the frequency of your falls

## 2024-11-13 NOTE — Assessment & Plan Note (Signed)
 Traumatic wounds in near proximity to significant varicosities.  ABI's reviewed,  and normal in Sept 2025. Advised to keep wounds covered and protect legs.with thick stockings

## 2024-11-13 NOTE — Assessment & Plan Note (Addendum)
"   In light of recurrent falls,  3 since Nov 3 i had a lengthy  discussion today with patient and daughters about the risks of continuining eliquis .  Daughters and patient understand the risk of embolic CVA without Eliquis  as well as the risk of SAH /ICH if she were to fall and strike her head.  I have advised patient and family to discontinue Eliquis   "

## 2024-11-14 ENCOUNTER — Ambulatory Visit: Admitting: Internal Medicine

## 2024-11-14 ENCOUNTER — Encounter: Payer: Self-pay | Admitting: Internal Medicine

## 2024-11-16 ENCOUNTER — Ambulatory Visit: Admitting: Internal Medicine

## 2024-11-22 ENCOUNTER — Ambulatory Visit: Admitting: Podiatry

## 2024-11-22 DIAGNOSIS — M79675 Pain in left toe(s): Secondary | ICD-10-CM

## 2024-11-22 DIAGNOSIS — B351 Tinea unguium: Secondary | ICD-10-CM | POA: Diagnosis not present

## 2024-11-22 DIAGNOSIS — M79674 Pain in right toe(s): Secondary | ICD-10-CM

## 2024-11-22 NOTE — Progress Notes (Signed)
"  °  Subjective:  Patient ID: [name], female    DOB: 03/14/1927,  MRN: [id number]  Chief Complaint  Patient presents with   Nail Problem   89 y.o. female returns for the above complaint.  Patient presents with thickened elongated dystrophic mycotic toenails x 10 mild pain on palpation hurts with ambulation and shoe pressure would like to have a debride unable to do it herself denies any other acute complaints  Objective:  There were no vitals filed for this visit. Podiatric Exam: Vascular: dorsalis pedis and posterior tibial pulses are palpable bilateral. Capillary return is immediate. Temperature gradient is WNL. Skin turgor WNL  Sensorium: Normal Semmes Weinstein monofilament test. Normal tactile sensation bilaterally. Nail Exam: Pt has thick disfigured discolored nails with subungual debris noted bilateral entire nail hallux through fifth toenails.  Pain on palpation to the nails. Ulcer Exam: There is no evidence of ulcer or pre-ulcerative changes or infection. Orthopedic Exam: Muscle tone and strength are WNL. No limitations in general ROM. No crepitus or effusions noted.  Skin: No Porokeratosis. No infection or ulcers    Assessment & Plan:   1. Pain due to onychomycosis of toenails of both feet     Patient was evaluated and treated and all questions answered.  Onychomycosis with pain  -Nails palliatively debrided as below. -Educated on self-care  Procedure: Nail Debridement Rationale: pain  Type of Debridement: manual, sharp debridement. Instrumentation: Nail nipper, rotary burr. Number of Nails: 10  Procedures and Treatment: Consent by patient was obtained for treatment procedures. The patient understood the discussion of treatment and procedures well. All questions were answered thoroughly reviewed. Debridement of mycotic and hypertrophic toenails, 1 through 5 bilateral and clearing of subungual debris. No ulceration, no infection noted.  Return Visit-Office  Procedure: Patient instructed to return to the office for a follow up visit 3 months for continued evaluation and treatment.  Franky Blanch, DPM    No follow-ups on file.  "

## 2024-11-24 ENCOUNTER — Encounter: Payer: Self-pay | Admitting: Internal Medicine

## 2024-11-28 NOTE — Progress Notes (Unsigned)
 "     Electrophysiology Clinic Note    Bell:  11/29/2024  Patient ID:  [name], [name] 1927/05/31, MRN [id number] PCP:  Marylynn Verneita CROME, MD  Cardiologist:  Redell Cave, MD  Electrophysiology APP:  Saga Balthazar, NP     Discussed the use of AI scribe software for clinical note transcription with the patient, who gave verbal consent to proceed.   Patient Profile    Chief Complaint: recurrent falls  History of Present Illness: [name] is a 89 y.o. female with PMH notable for HTN, PVCs, CKD, celiac artery stenosis s/p stenting, RA; seen today for EP follow-up  She was hospitalized 08/2024 with acute onset RUE numbness. She was discharged with zio monitor which noted brief episodes of atrial flutter and was started on eliquis .  I saw her 10/2024 where she had several mechanical falls. She had started PT for balance and was having improvement. Eliquis  continued.   On follow-up today, she has not had any further falls since our last visit. Recent visit with PCP, where falls on NOAC were discussed and recommended stopping eliquis .  Patient is not aware of any AF episodes. She denies chest pain, chest pressure, palpitations. Her main concern is ongoing weakness.   She has continued to take eliquis  BID, no bleeding concerns.    Arrhythmia/Device History No specialty comments available.    ROS:  Please see the history of present illness. All other systems are reviewed and otherwise negative.    Physical Exam    VS:  BP 122/62 (BP Location: Left Arm, Patient Position: Sitting, Cuff Size: Normal)   Pulse 65   Ht 5' (1.524 m)   Wt 95 lb (43.1 kg)   SpO2 97%   BMI 18.55 kg/m  BMI: Body mass index is 18.55 kg/m.           Wt Readings from Last 3 Encounters:  11/29/24 95 lb (43.1 kg)  11/13/24 96 lb (43.5 kg)  11/06/24 93 lb 9.6 oz (42.5 kg)     GEN- The patient appears stated age, thin, alert and oriented x 3 today.   Lungs- Clear to ausculation  bilaterally, normal work of breathing.  Heart- Regular rate and rhythm, no murmurs, rubs or gallops Extremities- No peripheral edema, warm, dry.    Studies Reviewed   Previous EP, cardiology notes.    EKG is not ordered. Personal review of EKG from 09/25/2024 shows:  SR at 62       Long term monitor, 09/19/2024 Average heart rate 66, range 44-119. Brief episode of atrial flutter noted. Some episodes of SVT with actually atrial flutter. Occasional PVCs, 2.1% burden. With recent history of stroke, recommend starting Eliquis  2.5 mg twice daily (stop aspirin , Plavix  with starting Eliquis .)  Bubble TTE, 08/29/2024 1. Left ventricular ejection fraction, by estimation, is 60 to 65%. The left ventricle has normal function. The left ventricle has no regional wall motion abnormalities. There is mild left ventricular hypertrophy. Left ventricular diastolic parameters are consistent with Grade I diastolic dysfunction (impaired relaxation).   2. Right ventricular systolic function is normal. The right ventricular size is normal.   3. The mitral valve is normal in structure. Mild mitral valve regurgitation.   4. The aortic valve is tricuspid. There is mild thickening of the aortic valve. Aortic valve regurgitation is trivial.   5. Agitated saline contrast bubble study was negative, with no evidence of any interatrial shunt.    Assessment and Plan     #)  aflutter No symptomatic burden, very low burden on recent 2 week zio Continue to monitor   #) Hypercoag d/t aflutter #) CVA #) recurrent falls CHA2DS2-VASc Score = at least 7 [CHF History: 0, HTN History: 1, Diabetes History: 0, Stroke History: 2, Vascular Disease History: 1, Age Score: 2, Gender Score: 1].  Therefore, the patient's annual risk of stroke is 11.2 %.     Has Bled score = 4, indicating 8.7% annual risk of major bleeding  Stroke ppx - 2.5mg  eliquis  BID Long discussion with patient and her daughters regarding whether to  continue Eliquis  versus stopping.  Given that patient has not fallen after making accommodations around the home to promote safety, would favor continuing Eliquis  at this time.  We discussed that if patient does have recurrent falls then would recommend she stop Eliquis .    Current medicines are reviewed at length with the patient today.   The patient does not have concerns regarding her medicines.  The following changes were made today:   none  Labs/ tests ordered today include:  No orders of the defined types were placed in this encounter.    Disposition: Follow up with Dr. Kennyth or EP APP in 12 months  Follow-up with general cardiology in ~6 months as previously recommended   Signed, Ayva Veilleux, NP  11/29/24  4:17 PM  Electrophysiology [facility] "

## 2024-11-29 ENCOUNTER — Encounter: Payer: Self-pay | Admitting: Cardiology

## 2024-11-29 ENCOUNTER — Telehealth: Payer: Self-pay

## 2024-11-29 ENCOUNTER — Ambulatory Visit: Attending: Cardiology | Admitting: Cardiology

## 2024-11-29 VITALS — BP 122/62 | HR 65 | Ht 60.0 in | Wt 95.0 lb

## 2024-11-29 DIAGNOSIS — R296 Repeated falls: Secondary | ICD-10-CM

## 2024-11-29 DIAGNOSIS — I4892 Unspecified atrial flutter: Secondary | ICD-10-CM | POA: Diagnosis not present

## 2024-11-29 DIAGNOSIS — D6869 Other thrombophilia: Secondary | ICD-10-CM

## 2024-11-29 DIAGNOSIS — I639 Cerebral infarction, unspecified: Secondary | ICD-10-CM | POA: Diagnosis not present

## 2024-11-29 NOTE — Patient Instructions (Signed)
 Medication Instructions:  Your physician recommends that you continue on your current medications as directed. Please refer to the Current Medication list given to you today.  *If you need a refill on your cardiac medications before your next appointment, please call your pharmacy*  Lab Work: No labs ordered today    Testing/Procedures: No test ordered today   Follow-Up: At [facility], you and your health needs are our priority.  As part of our continuing mission to provide you with exceptional heart care, our providers are all part of one team.  This team includes your primary Cardiologist (physician) and Advanced Practice Providers or APPs (Physician Assistants and Nurse Practitioners) who all work together to provide you with the care you need, when you need it.  Your next appointment:   1 year(s)  Provider:   Suzann Riddle, NP

## 2024-11-29 NOTE — Telephone Encounter (Signed)
 Copied from CRM #[id number]. Topic: General - Other >> Nov 29, 2024  3:14 PM Burnard DEL wrote: Reason for CRM: Powell from [facility] called in to report a small wound on patients right leg. Bactrim  was sent in by provider,however the wound is really red to the touch,skin around the wound,mid shin above ankle is red,along with some swelling lateral  to the wound. She said that it looks like it is getting worse instead of better. She would like to know if patient may need to come in to get wound evaluated or would provider like to prescribe something else? She will redress wound.

## 2024-11-29 NOTE — Telephone Encounter (Signed)
 FYI-- [name] at [facility] and advised that the Patient needs to be evaluated for her right leg redness, swelling and looks worse even though Dr. Marylynn called in Bactrim . Bell said they will have to take the Patient to the ED since we do Not have any appointments available until next week.

## 2024-11-30 ENCOUNTER — Ambulatory Visit
Admission: RE | Admit: 2024-11-30 | Discharge: 2024-11-30 | Disposition: A | Discharge status: Home / Self Care | Attending: Emergency Medicine

## 2024-11-30 VITALS — BP 138/80 | HR 71 | Temp 97.9°F | Resp 16

## 2024-11-30 DIAGNOSIS — I10 Essential (primary) hypertension: Secondary | ICD-10-CM | POA: Diagnosis not present

## 2024-11-30 DIAGNOSIS — L03115 Cellulitis of right lower limb: Secondary | ICD-10-CM | POA: Diagnosis not present

## 2024-11-30 MED ORDER — CEPHALEXIN 500 MG PO CAPS: 500.0000 mg | ORAL_CAPSULE | Freq: Three times a day (TID) | ORAL | 0 refills | Status: AC

## 2024-11-30 NOTE — Discharge Instructions (Addendum)
 Take the cephalexin  as directed for the infection in your leg.  Follow-up with your primary care provider on Monday.  Go to the emergency department if you have worsening symptoms.    Your blood pressure is elevated today at 160/82.  Please have this rechecked by your primary care provider.
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
## 2024-11-30 NOTE — ED Triage Notes (Signed)
 Pt  presents for a wound check. She has a wound her her right shin for 1 month. The past 2 days the wound has been warm to touch and painful. Pt has been on 2 rounds of antibiotics.

## 2024-12-07 ENCOUNTER — Emergency Department
Admission: EM | Admit: 2024-12-07 | Discharge: 2024-12-07 | Disposition: A | Discharge status: Home / Self Care | Attending: Emergency Medicine | Admitting: Emergency Medicine

## 2024-12-07 ENCOUNTER — Other Ambulatory Visit: Payer: Self-pay

## 2024-12-07 ENCOUNTER — Emergency Department

## 2024-12-07 DIAGNOSIS — S129XXA Fracture of neck, unspecified, initial encounter: Secondary | ICD-10-CM

## 2024-12-07 DIAGNOSIS — W01198A Fall on same level from slipping, tripping and stumbling with subsequent striking against other object, initial encounter: Secondary | ICD-10-CM | POA: Insufficient documentation

## 2024-12-07 DIAGNOSIS — S199XXA Unspecified injury of neck, initial encounter: Secondary | ICD-10-CM | POA: Diagnosis present

## 2024-12-07 DIAGNOSIS — S12291A Other nondisplaced fracture of third cervical vertebra, initial encounter for closed fracture: Secondary | ICD-10-CM | POA: Diagnosis not present

## 2024-12-07 DIAGNOSIS — S0003XA Contusion of scalp, initial encounter: Secondary | ICD-10-CM | POA: Diagnosis not present

## 2024-12-07 DIAGNOSIS — S0990XA Unspecified injury of head, initial encounter: Secondary | ICD-10-CM

## 2024-12-07 DIAGNOSIS — S51011A Laceration without foreign body of right elbow, initial encounter: Secondary | ICD-10-CM | POA: Insufficient documentation

## 2024-12-07 DIAGNOSIS — W19XXXA Unspecified fall, initial encounter: Secondary | ICD-10-CM

## 2024-12-07 DIAGNOSIS — Z7901 Long term (current) use of anticoagulants: Secondary | ICD-10-CM | POA: Insufficient documentation

## 2024-12-07 LAB — CBC
HCT: 40.4 % (ref 36.0–46.0)
Hemoglobin: 13.4 g/dL (ref 12.0–15.0)
MCH: 29.9 pg (ref 26.0–34.0)
MCHC: 33.2 g/dL (ref 30.0–36.0)
MCV: 90.2 fL (ref 80.0–100.0)
Platelets: 233 10*3/uL (ref 150–400)
RBC: 4.48 MIL/uL (ref 3.87–5.11)
RDW: 12.3 % (ref 11.5–15.5)
WBC: 5.4 10*3/uL (ref 4.0–10.5)
nRBC: 0 % (ref 0.0–0.2)

## 2024-12-07 LAB — PROTIME-INR
INR: 1 (ref 0.8–1.2)
Prothrombin Time: 13.9 s (ref 11.4–15.2)

## 2024-12-07 LAB — COMPREHENSIVE METABOLIC PANEL WITH GFR
ALT: 31 U/L (ref 0–44)
AST: 46 U/L — ABNORMAL HIGH (ref 15–41)
Albumin: 4.3 g/dL (ref 3.5–5.0)
Alkaline Phosphatase: 61 U/L (ref 38–126)
Anion gap: 11 (ref 5–15)
BUN: 15 mg/dL (ref 8–23)
CO2: 28 mmol/L (ref 22–32)
Calcium: 9.3 mg/dL (ref 8.9–10.3)
Chloride: 100 mmol/L (ref 98–111)
Creatinine, Ser: 0.69 mg/dL (ref 0.44–1.00)
GFR, Estimated: >60 mL/min
Glucose, Bld: 204 mg/dL — ABNORMAL HIGH (ref 70–99)
Potassium: 3.9 mmol/L (ref 3.5–5.1)
Sodium: 139 mmol/L (ref 135–145)
Total Bilirubin: 0.3 mg/dL (ref 0.0–1.2)
Total Protein: 7.1 g/dL (ref 6.5–8.1)

## 2024-12-07 NOTE — ED Triage Notes (Signed)
 Pt arrives via POV from home after a fall on thinners. Pt was trying to get onions out of the bottom of the fridge and the door closed on them and they let go of the drawer and fell over.  Pt has a small knot on the left side of their head with a small laceration that appears to have clotted at this time. Pt endorses some neck pain and states that when they fell it felt like they pulled a ligament. Pt is A&Ox4 during triage.

## 2024-12-07 NOTE — ED Provider Notes (Signed)
 "  [facility] Provider Note    Event Date/Time   First MD Initiated Contact with Patient 12/07/24 1951     (approximate)   History   Fall   HPI  [name] is a 89 y.o. female on Eliquis  who presents after a fall.  Patient reports she fell and hit the left side of her head, suffered a skin tear to the right elbow and also may have injured her neck.  She denies neurodeficits.  Overall feels well at this time     Physical Exam   Triage Vital Signs: ED Triage Vitals  Encounter Vitals Group     BP 12/07/24 1829 (!) 187/96     Girls Systolic BP Percentile --      Girls Diastolic BP Percentile --      Boys Systolic BP Percentile --      Boys Diastolic BP Percentile --      Pulse Rate 12/07/24 1829 79     Resp 12/07/24 1829 17     Temp 12/07/24 1829 98.2 F (36.8 C)     Temp Source 12/07/24 1829 Oral     SpO2 12/07/24 1829 97 %     Weight 12/07/24 1831 43.1 kg (95 lb)     Height 12/07/24 1831 1.524 m (5')     Head Circumference --      Peak Flow --      Pain Score 12/07/24 1829 0     Pain Loc --      Pain Education --      Exclude from Growth Chart --     Most recent vital signs: Vitals:   12/07/24 1829  BP: (!) 187/96  Pulse: 79  Resp: 17  Temp: 98.2 F (36.8 C)  SpO2: 97%     General: Awake, no distress.  Pleasant and interactive CV:  Good peripheral perfusion.  Resp:  Normal effort.  Abd:  No distention.  Soft, nontender Other:  Small hematoma to the left lateral superior scalp, punctate wound no active bleeding  Small skin tear to the right elbow Mild vertebral tenderness to palpation of the mid cervical spine Neurologically intact No pain with axial load on both hips, normal range of motion of all extremities  ED Results / Procedures / Treatments   Labs (all labs ordered are listed, but only abnormal results are displayed) Labs Reviewed  COMPREHENSIVE METABOLIC PANEL WITH GFR - Abnormal; Notable for the following  components:      Result Value   Glucose, Bld 204 (*)    AST 46 (*)    All other components within normal limits  CBC  PROTIME-INR     EKG     RADIOLOGY     PROCEDURES:  Critical Care performed:   Procedures   MEDICATIONS ORDERED IN ED: Medications - No data to display   IMPRESSION / MDM / ASSESSMENT AND PLAN / ED COURSE  I reviewed the triage vital signs and the nursing notes. Patient's presentation is most consistent with acute presentation with potential threat to life or bodily function.  Patient presents after a fall with head injury on Eliquis .  Sent for CT head and cervical spine  No signs of ICH on CT head however she does have evidence of a C3 spinous process fracture  We discussed these findings, she is reassured, supportive care for spinous process fracture she can follow-up with spine surgery as needed, appropriate for discharge at this time  FINAL CLINICAL IMPRESSION(S) / ED DIAGNOSES   Final diagnoses:  Fall, initial encounter  Injury of head, initial encounter  Closed fracture of spinous process of cervical vertebra, initial encounter (HCC)     Rx / DC Orders   ED Discharge Orders     None        Note:  This document was prepared using Dragon voice recognition software and may include unintentional dictation errors.   Arlander Charleston, MD 12/07/24 2240  "

## 2024-12-13 ENCOUNTER — Ambulatory Visit: Payer: Self-pay

## 2024-12-13 NOTE — Telephone Encounter (Signed)
 Spoke with pt's daughter and she stated that pt is not having the any neck pain or headaches. They do not have an appt with Dr. Katrina.

## 2024-12-13 NOTE — Telephone Encounter (Signed)
 FYI Only or Action Required?: FYI only for provider: appointment scheduled on 12/20/24.  Patient was last seen in primary care on 11/13/2024 by Marylynn Verneita CROME, MD.  Called Nurse Triage reporting Hospitalization Follow-up.  Symptoms began several days ago.  Interventions attempted: Rest, hydration, or home remedies.  Symptoms are: stable.  Triage Disposition: Home Care  Patient/caregiver understands and will follow disposition?: Yes  Message from Olam RAMAN sent at 12/13/2024 11:40 AM EST  Reason for Triage: heather nurse [facility] calling to let provider know pt had a fall Friday night and is on eliquis  did go to er and di not have any bleeding or breaks, pt wanted to make sure provider is aware of fall and if she needed to come in. Had a head bump on L side, tender to touch. Discharge shows closed fracture of cervical vertebrae. No neck pain Pt has a left side wound prov is aware and is on medication and has a little of cellulitis and redness and family sent a message to provider on mychart   Reason for Disposition  [1] Condition / symptoms BETTER (improving) AND [2] caller has additional questions triager can answer  Answer Assessment - Initial Assessment Questions 1. MAIN CONCERN OR SYMPTOM:  What is your main concern right now? What question do you have? What's the main symptom you're worried about? (e.g., breathing difficulty, ankle swelling, weight gain.)     Wanting to make sure PCP aware of fall and ED visit from 12/07/24 2. ONSET: When did the  sx  start?     Last Friday  3. BETTER-SAME-WORSE: Are you getting better, staying the same, or getting worse compared to the day you were discharged?     better 4. HOSPITALIZATION: How long were you hospitalized? (e.g., days)     ED visit, no overnight stay  5. DISCHARGE DIAGNOSIS:  What problem or disease were you hospitalized for?     Fall, head injury and C3 fx 6. DISCHARGE DATE: What date were you discharged from  the hospital?     12/07/24 8. DISCHARGE APPOINTMENT: Have you scheduled a follow-up discharge appointment with your doctor?     Scheduled today for 12/20/24 10. PAIN: Is there any pain? If Yes, ask: How bad is it?  (Scale 0-10; or none, mild, moderate, severe)       no 12. OTHER SYMPTOMS: Do you have any other symptoms?       Leg wound that provider aware of and responded back via Mychart  Protocols used: Post-Hospitalization Follow-up Call-A-[facility]

## 2024-12-20 ENCOUNTER — Inpatient Hospital Stay: Admitting: Internal Medicine

## 2025-01-10 ENCOUNTER — Ambulatory Visit: Admitting: Internal Medicine

## 2025-02-14 ENCOUNTER — Ambulatory Visit: Admitting: Internal Medicine

## 2025-05-06 ENCOUNTER — Ambulatory Visit: Admitting: Medical

## 2025-05-27 ENCOUNTER — Ambulatory Visit

## 2025-07-12 ENCOUNTER — Encounter (INDEPENDENT_AMBULATORY_CARE_PROVIDER_SITE_OTHER)

## 2025-07-12 ENCOUNTER — Ambulatory Visit (INDEPENDENT_AMBULATORY_CARE_PROVIDER_SITE_OTHER): Admitting: Nurse Practitioner
